# Patient Record
Sex: Male | Born: 1935
Health system: Southern US, Community
[De-identification: ages and names within clinical notes are randomized; demographics above are authoritative.]

## PROBLEM LIST (undated history)

## (undated) ENCOUNTER — Emergency Department (HOSPITAL_COMMUNITY): Admission: EM | Payer: Self-pay | Source: Home / Self Care

## (undated) DIAGNOSIS — Z7901 Long term (current) use of anticoagulants: Secondary | ICD-10-CM

## (undated) DIAGNOSIS — Z8679 Personal history of other diseases of the circulatory system: Secondary | ICD-10-CM

## (undated) DIAGNOSIS — Z952 Presence of prosthetic heart valve: Secondary | ICD-10-CM

## (undated) DIAGNOSIS — I25810 Atherosclerosis of coronary artery bypass graft(s) without angina pectoris: Secondary | ICD-10-CM

## (undated) DIAGNOSIS — I10 Essential (primary) hypertension: Secondary | ICD-10-CM

## (undated) DIAGNOSIS — E039 Hypothyroidism, unspecified: Secondary | ICD-10-CM

## (undated) DIAGNOSIS — I493 Ventricular premature depolarization: Secondary | ICD-10-CM

## (undated) DIAGNOSIS — I483 Typical atrial flutter: Secondary | ICD-10-CM

## (undated) DIAGNOSIS — I4891 Unspecified atrial fibrillation: Secondary | ICD-10-CM

## (undated) HISTORY — DX: Atherosclerosis of coronary artery bypass graft(s) without angina pectoris: I25.810

## (undated) HISTORY — DX: Ventricular premature depolarization: I49.3

## (undated) HISTORY — DX: Presence of prosthetic heart valve: Z95.2

## (undated) HISTORY — PX: FOOT SURGERY: SHX648

## (undated) HISTORY — DX: Unspecified atrial fibrillation: I48.91

## (undated) HISTORY — DX: Essential (primary) hypertension: I10

## (undated) HISTORY — DX: Long term (current) use of anticoagulants: Z79.01

## (undated) SURGERY — EGD (ESOPHAGOGASTRODUODENOSCOPY)
Anesthesia: Monitor Anesthesia Care

---
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
## 1998-09-03 DIAGNOSIS — Z952 Presence of prosthetic heart valve: Secondary | ICD-10-CM

## 1998-09-03 DIAGNOSIS — I25810 Atherosclerosis of coronary artery bypass graft(s) without angina pectoris: Secondary | ICD-10-CM

## 1998-09-03 HISTORY — DX: Presence of prosthetic heart valve: Z95.2

## 1998-09-03 HISTORY — PX: AORTIC VALVE REPLACEMENT (AVR)/CORONARY ARTERY BYPASS GRAFTING (CABG): SHX5725

## 1998-09-03 HISTORY — DX: Atherosclerosis of coronary artery bypass graft(s) without angina pectoris: I25.810

## 1999-08-18 ENCOUNTER — Inpatient Hospital Stay (HOSPITAL_COMMUNITY): Admission: EM | Admit: 1999-08-18 | Discharge: 1999-08-31 | Payer: Self-pay | Admitting: *Deleted

## 1999-08-18 ENCOUNTER — Encounter: Payer: Self-pay | Admitting: *Deleted

## 1999-08-23 ENCOUNTER — Encounter: Payer: Self-pay | Admitting: Internal Medicine

## 1999-08-24 ENCOUNTER — Encounter: Payer: Self-pay | Admitting: Thoracic Surgery (Cardiothoracic Vascular Surgery)

## 1999-08-25 ENCOUNTER — Encounter: Payer: Self-pay | Admitting: Thoracic Surgery (Cardiothoracic Vascular Surgery)

## 1999-08-26 ENCOUNTER — Encounter: Payer: Self-pay | Admitting: Cardiothoracic Surgery

## 1999-08-27 ENCOUNTER — Encounter: Payer: Self-pay | Admitting: Thoracic Surgery (Cardiothoracic Vascular Surgery)

## 1999-08-29 ENCOUNTER — Encounter: Payer: Self-pay | Admitting: Thoracic Surgery (Cardiothoracic Vascular Surgery)

## 1999-09-19 ENCOUNTER — Encounter (HOSPITAL_COMMUNITY): Admission: RE | Admit: 1999-09-19 | Discharge: 1999-12-18 | Payer: Self-pay | Admitting: Interventional Cardiology

## 1999-11-10 ENCOUNTER — Emergency Department (HOSPITAL_COMMUNITY): Admission: EM | Admit: 1999-11-10 | Discharge: 1999-11-10 | Payer: Self-pay | Admitting: Emergency Medicine

## 2000-12-26 ENCOUNTER — Encounter: Admission: RE | Admit: 2000-12-26 | Discharge: 2000-12-26 | Payer: Self-pay | Admitting: Internal Medicine

## 2000-12-26 ENCOUNTER — Encounter: Payer: Self-pay | Admitting: Internal Medicine

## 2001-04-07 ENCOUNTER — Ambulatory Visit (HOSPITAL_COMMUNITY): Admission: RE | Admit: 2001-04-07 | Discharge: 2001-04-07 | Payer: Self-pay | Admitting: Interventional Cardiology

## 2002-07-03 ENCOUNTER — Emergency Department (HOSPITAL_COMMUNITY): Admission: EM | Admit: 2002-07-03 | Discharge: 2002-07-03 | Payer: Self-pay | Admitting: Emergency Medicine

## 2002-07-03 ENCOUNTER — Encounter: Payer: Self-pay | Admitting: *Deleted

## 2003-11-08 ENCOUNTER — Emergency Department (HOSPITAL_COMMUNITY): Admission: EM | Admit: 2003-11-08 | Discharge: 2003-11-08 | Payer: Self-pay | Admitting: Emergency Medicine

## 2003-11-22 ENCOUNTER — Encounter: Admission: RE | Admit: 2003-11-22 | Discharge: 2003-11-22 | Payer: Self-pay | Admitting: Internal Medicine

## 2004-01-31 ENCOUNTER — Ambulatory Visit (HOSPITAL_COMMUNITY): Admission: RE | Admit: 2004-01-31 | Discharge: 2004-01-31 | Payer: Self-pay | Admitting: Orthopedic Surgery

## 2004-03-29 ENCOUNTER — Ambulatory Visit (HOSPITAL_COMMUNITY): Admission: RE | Admit: 2004-03-29 | Discharge: 2004-03-29 | Payer: Self-pay | Admitting: Gastroenterology

## 2004-05-30 ENCOUNTER — Encounter: Admission: RE | Admit: 2004-05-30 | Discharge: 2004-05-30 | Payer: Self-pay | Admitting: Orthopedic Surgery

## 2004-06-12 ENCOUNTER — Encounter: Admission: RE | Admit: 2004-06-12 | Discharge: 2004-06-12 | Payer: Self-pay | Admitting: Orthopedic Surgery

## 2004-06-13 ENCOUNTER — Ambulatory Visit (HOSPITAL_BASED_OUTPATIENT_CLINIC_OR_DEPARTMENT_OTHER): Admission: RE | Admit: 2004-06-13 | Discharge: 2004-06-13 | Payer: Self-pay | Admitting: Orthopedic Surgery

## 2004-06-13 ENCOUNTER — Ambulatory Visit (HOSPITAL_COMMUNITY): Admission: RE | Admit: 2004-06-13 | Discharge: 2004-06-13 | Payer: Self-pay | Admitting: Orthopedic Surgery

## 2005-01-09 ENCOUNTER — Ambulatory Visit (HOSPITAL_COMMUNITY): Admission: RE | Admit: 2005-01-09 | Discharge: 2005-01-09 | Payer: Self-pay | Admitting: Interventional Cardiology

## 2005-01-17 ENCOUNTER — Encounter: Admission: RE | Admit: 2005-01-17 | Discharge: 2005-01-17 | Payer: Self-pay

## 2005-04-02 ENCOUNTER — Emergency Department (HOSPITAL_COMMUNITY): Admission: EM | Admit: 2005-04-02 | Discharge: 2005-04-03 | Payer: Self-pay | Admitting: Emergency Medicine

## 2005-04-07 ENCOUNTER — Inpatient Hospital Stay (HOSPITAL_COMMUNITY): Admission: EM | Admit: 2005-04-07 | Discharge: 2005-04-13 | Payer: Self-pay | Admitting: Emergency Medicine

## 2005-06-22 ENCOUNTER — Inpatient Hospital Stay (HOSPITAL_COMMUNITY): Admission: EM | Admit: 2005-06-22 | Discharge: 2005-06-29 | Payer: Self-pay | Admitting: Emergency Medicine

## 2005-08-14 ENCOUNTER — Emergency Department (HOSPITAL_COMMUNITY): Admission: EM | Admit: 2005-08-14 | Discharge: 2005-08-14 | Payer: Self-pay | Admitting: Emergency Medicine

## 2010-09-24 ENCOUNTER — Encounter: Payer: Self-pay | Admitting: Endocrinology

## 2010-11-18 ENCOUNTER — Inpatient Hospital Stay (INDEPENDENT_AMBULATORY_CARE_PROVIDER_SITE_OTHER)
Admission: RE | Admit: 2010-11-18 | Discharge: 2010-11-18 | Disposition: A | Discharge status: Home / Self Care | Payer: Medicare Other | Source: Ambulatory Visit | Attending: Emergency Medicine | Admitting: Emergency Medicine

## 2010-11-18 DIAGNOSIS — S61209A Unspecified open wound of unspecified finger without damage to nail, initial encounter: Secondary | ICD-10-CM

## 2010-11-18 DIAGNOSIS — S8010XA Contusion of unspecified lower leg, initial encounter: Secondary | ICD-10-CM

## 2010-11-18 LAB — PROTIME-INR
INR: 2.36 — ABNORMAL HIGH (ref 0.00–1.49)
Prothrombin Time: 25.9 seconds — ABNORMAL HIGH (ref 11.6–15.2)

## 2010-11-20 ENCOUNTER — Inpatient Hospital Stay (HOSPITAL_COMMUNITY)
Admission: RE | Admit: 2010-11-20 | Discharge: 2010-11-20 | Disposition: A | Discharge status: Home / Self Care | Payer: No Typology Code available for payment source | Source: Ambulatory Visit

## 2010-11-21 ENCOUNTER — Inpatient Hospital Stay (HOSPITAL_COMMUNITY)
Admission: RE | Admit: 2010-11-21 | Discharge: 2010-11-21 | Disposition: A | Discharge status: Home / Self Care | Payer: No Typology Code available for payment source | Source: Ambulatory Visit

## 2011-06-03 ENCOUNTER — Emergency Department (HOSPITAL_COMMUNITY)
Admission: EM | Admit: 2011-06-03 | Discharge: 2011-06-03 | Disposition: A | Discharge status: Home / Self Care | Payer: Medicare Other | Attending: Emergency Medicine | Admitting: Emergency Medicine

## 2011-06-03 ENCOUNTER — Emergency Department (HOSPITAL_COMMUNITY): Payer: Medicare Other

## 2011-06-03 DIAGNOSIS — E039 Hypothyroidism, unspecified: Secondary | ICD-10-CM | POA: Insufficient documentation

## 2011-06-03 DIAGNOSIS — E7800 Pure hypercholesterolemia, unspecified: Secondary | ICD-10-CM | POA: Insufficient documentation

## 2011-06-03 DIAGNOSIS — Z79899 Other long term (current) drug therapy: Secondary | ICD-10-CM | POA: Insufficient documentation

## 2011-06-03 DIAGNOSIS — I10 Essential (primary) hypertension: Secondary | ICD-10-CM | POA: Insufficient documentation

## 2011-06-03 DIAGNOSIS — R079 Chest pain, unspecified: Secondary | ICD-10-CM | POA: Insufficient documentation

## 2011-06-03 DIAGNOSIS — R002 Palpitations: Secondary | ICD-10-CM | POA: Insufficient documentation

## 2011-06-03 DIAGNOSIS — Z7901 Long term (current) use of anticoagulants: Secondary | ICD-10-CM | POA: Insufficient documentation

## 2011-06-03 LAB — BASIC METABOLIC PANEL
BUN: 16 mg/dL (ref 6–23)
CO2: 29 mEq/L (ref 19–32)
Calcium: 9.7 mg/dL (ref 8.4–10.5)
Chloride: 102 mEq/L (ref 96–112)
Creatinine, Ser: 1.21 mg/dL (ref 0.50–1.35)
GFR calc Af Amer: >60 mL/min (ref >60)
GFR calc non Af Amer: 58 mL/min — ABNORMAL LOW (ref >60)
Glucose, Bld: 100 mg/dL — ABNORMAL HIGH (ref 70–99)
Potassium: 4.7 mEq/L (ref 3.5–5.1)
Sodium: 137 mEq/L (ref 135–145)

## 2011-06-03 LAB — POCT I-STAT TROPONIN I
Troponin i, poc: 0.01 ng/mL (ref 0.00–0.08)
Troponin i, poc: 0.01 ng/mL (ref 0.00–0.08)

## 2011-06-03 LAB — CBC
HCT: 41.9 % (ref 39.0–52.0)
Hemoglobin: 13.9 g/dL (ref 13.0–17.0)
MCH: 31.7 pg (ref 26.0–34.0)
MCHC: 33.2 g/dL (ref 30.0–36.0)
MCV: 95.4 fL (ref 78.0–100.0)
Platelets: 196 10*3/uL (ref 150–400)
RBC: 4.39 MIL/uL (ref 4.22–5.81)
RDW: 13.9 % (ref 11.5–15.5)
WBC: 8.4 10*3/uL (ref 4.0–10.5)

## 2011-06-03 LAB — DIFFERENTIAL
Basophils Absolute: 0 10*3/uL (ref 0.0–0.1)
Basophils Relative: 0 % (ref 0–1)
Eosinophils Absolute: 0.3 10*3/uL (ref 0.0–0.7)
Eosinophils Relative: 4 % (ref 0–5)
Lymphocytes Relative: 18 % (ref 12–46)
Lymphs Abs: 1.5 10*3/uL (ref 0.7–4.0)
Monocytes Absolute: 0.9 10*3/uL (ref 0.1–1.0)
Monocytes Relative: 11 % (ref 3–12)
Neutro Abs: 5.7 10*3/uL (ref 1.7–7.7)
Neutrophils Relative %: 67 % (ref 43–77)

## 2011-06-03 LAB — APTT: aPTT: 42 seconds — ABNORMAL HIGH (ref 24–37)

## 2011-06-03 LAB — PROTIME-INR
INR: 3.02 — ABNORMAL HIGH (ref 0.00–1.49)
Prothrombin Time: 31.8 seconds — ABNORMAL HIGH (ref 11.6–15.2)

## 2011-09-11 DIAGNOSIS — Z7901 Long term (current) use of anticoagulants: Secondary | ICD-10-CM | POA: Diagnosis not present

## 2011-09-11 DIAGNOSIS — E039 Hypothyroidism, unspecified: Secondary | ICD-10-CM | POA: Diagnosis not present

## 2011-09-11 DIAGNOSIS — K589 Irritable bowel syndrome without diarrhea: Secondary | ICD-10-CM | POA: Diagnosis not present

## 2011-09-11 DIAGNOSIS — I10 Essential (primary) hypertension: Secondary | ICD-10-CM | POA: Diagnosis not present

## 2011-10-16 DIAGNOSIS — Z7901 Long term (current) use of anticoagulants: Secondary | ICD-10-CM | POA: Diagnosis not present

## 2011-11-22 DIAGNOSIS — I10 Essential (primary) hypertension: Secondary | ICD-10-CM | POA: Diagnosis not present

## 2011-11-22 DIAGNOSIS — I4891 Unspecified atrial fibrillation: Secondary | ICD-10-CM | POA: Diagnosis not present

## 2011-11-22 DIAGNOSIS — Z954 Presence of other heart-valve replacement: Secondary | ICD-10-CM | POA: Diagnosis not present

## 2011-11-22 DIAGNOSIS — Z7901 Long term (current) use of anticoagulants: Secondary | ICD-10-CM | POA: Diagnosis not present

## 2011-11-22 DIAGNOSIS — E785 Hyperlipidemia, unspecified: Secondary | ICD-10-CM | POA: Diagnosis not present

## 2012-01-03 DIAGNOSIS — Z7901 Long term (current) use of anticoagulants: Secondary | ICD-10-CM | POA: Diagnosis not present

## 2012-01-17 DIAGNOSIS — R300 Dysuria: Secondary | ICD-10-CM | POA: Diagnosis not present

## 2012-01-21 DIAGNOSIS — Z7901 Long term (current) use of anticoagulants: Secondary | ICD-10-CM | POA: Diagnosis not present

## 2012-01-23 DIAGNOSIS — Z79899 Other long term (current) drug therapy: Secondary | ICD-10-CM | POA: Diagnosis not present

## 2012-01-23 DIAGNOSIS — R3989 Other symptoms and signs involving the genitourinary system: Secondary | ICD-10-CM | POA: Diagnosis not present

## 2012-01-23 DIAGNOSIS — E039 Hypothyroidism, unspecified: Secondary | ICD-10-CM | POA: Diagnosis not present

## 2012-01-23 DIAGNOSIS — E782 Mixed hyperlipidemia: Secondary | ICD-10-CM | POA: Diagnosis not present

## 2012-01-23 DIAGNOSIS — N529 Male erectile dysfunction, unspecified: Secondary | ICD-10-CM | POA: Diagnosis not present

## 2012-01-23 DIAGNOSIS — Z1331 Encounter for screening for depression: Secondary | ICD-10-CM | POA: Diagnosis not present

## 2012-01-23 DIAGNOSIS — I2510 Atherosclerotic heart disease of native coronary artery without angina pectoris: Secondary | ICD-10-CM | POA: Diagnosis not present

## 2012-01-23 DIAGNOSIS — Z0000 Encounter for general adult medical examination without abnormal findings: Secondary | ICD-10-CM | POA: Diagnosis not present

## 2012-01-23 DIAGNOSIS — I10 Essential (primary) hypertension: Secondary | ICD-10-CM | POA: Diagnosis not present

## 2012-01-23 DIAGNOSIS — Z954 Presence of other heart-valve replacement: Secondary | ICD-10-CM | POA: Diagnosis not present

## 2012-02-28 DIAGNOSIS — Z7901 Long term (current) use of anticoagulants: Secondary | ICD-10-CM | POA: Diagnosis not present

## 2012-02-28 DIAGNOSIS — R7989 Other specified abnormal findings of blood chemistry: Secondary | ICD-10-CM | POA: Diagnosis not present

## 2012-02-28 DIAGNOSIS — D509 Iron deficiency anemia, unspecified: Secondary | ICD-10-CM | POA: Diagnosis not present

## 2012-03-31 DIAGNOSIS — Z7901 Long term (current) use of anticoagulants: Secondary | ICD-10-CM | POA: Diagnosis not present

## 2012-04-07 DIAGNOSIS — H04129 Dry eye syndrome of unspecified lacrimal gland: Secondary | ICD-10-CM | POA: Diagnosis not present

## 2012-04-28 DIAGNOSIS — Z7901 Long term (current) use of anticoagulants: Secondary | ICD-10-CM | POA: Diagnosis not present

## 2012-05-28 DIAGNOSIS — Z7901 Long term (current) use of anticoagulants: Secondary | ICD-10-CM | POA: Diagnosis not present

## 2012-05-28 DIAGNOSIS — Z23 Encounter for immunization: Secondary | ICD-10-CM | POA: Diagnosis not present

## 2012-07-02 DIAGNOSIS — Z7901 Long term (current) use of anticoagulants: Secondary | ICD-10-CM | POA: Diagnosis not present

## 2012-07-30 DIAGNOSIS — Z7901 Long term (current) use of anticoagulants: Secondary | ICD-10-CM | POA: Diagnosis not present

## 2012-08-29 DIAGNOSIS — Z7901 Long term (current) use of anticoagulants: Secondary | ICD-10-CM | POA: Diagnosis not present

## 2012-09-29 DIAGNOSIS — Z7901 Long term (current) use of anticoagulants: Secondary | ICD-10-CM | POA: Diagnosis not present

## 2012-10-30 DIAGNOSIS — Z7901 Long term (current) use of anticoagulants: Secondary | ICD-10-CM | POA: Diagnosis not present

## 2012-11-25 DIAGNOSIS — I10 Essential (primary) hypertension: Secondary | ICD-10-CM | POA: Diagnosis not present

## 2012-11-25 DIAGNOSIS — I2510 Atherosclerotic heart disease of native coronary artery without angina pectoris: Secondary | ICD-10-CM | POA: Diagnosis not present

## 2012-11-25 DIAGNOSIS — Z7901 Long term (current) use of anticoagulants: Secondary | ICD-10-CM | POA: Diagnosis not present

## 2012-11-25 DIAGNOSIS — E785 Hyperlipidemia, unspecified: Secondary | ICD-10-CM | POA: Diagnosis not present

## 2012-11-25 DIAGNOSIS — I4891 Unspecified atrial fibrillation: Secondary | ICD-10-CM | POA: Diagnosis not present

## 2012-11-25 DIAGNOSIS — Z954 Presence of other heart-valve replacement: Secondary | ICD-10-CM | POA: Diagnosis not present

## 2012-12-30 DIAGNOSIS — Z7901 Long term (current) use of anticoagulants: Secondary | ICD-10-CM | POA: Diagnosis not present

## 2013-02-02 DIAGNOSIS — Z0000 Encounter for general adult medical examination without abnormal findings: Secondary | ICD-10-CM | POA: Diagnosis not present

## 2013-02-02 DIAGNOSIS — Z7901 Long term (current) use of anticoagulants: Secondary | ICD-10-CM | POA: Diagnosis not present

## 2013-02-02 DIAGNOSIS — G609 Hereditary and idiopathic neuropathy, unspecified: Secondary | ICD-10-CM | POA: Diagnosis not present

## 2013-02-02 DIAGNOSIS — I2510 Atherosclerotic heart disease of native coronary artery without angina pectoris: Secondary | ICD-10-CM | POA: Diagnosis not present

## 2013-02-02 DIAGNOSIS — I10 Essential (primary) hypertension: Secondary | ICD-10-CM | POA: Diagnosis not present

## 2013-02-02 DIAGNOSIS — M169 Osteoarthritis of hip, unspecified: Secondary | ICD-10-CM | POA: Diagnosis not present

## 2013-02-02 DIAGNOSIS — E039 Hypothyroidism, unspecified: Secondary | ICD-10-CM | POA: Diagnosis not present

## 2013-02-02 DIAGNOSIS — Z1331 Encounter for screening for depression: Secondary | ICD-10-CM | POA: Diagnosis not present

## 2013-02-02 DIAGNOSIS — E782 Mixed hyperlipidemia: Secondary | ICD-10-CM | POA: Diagnosis not present

## 2013-02-02 DIAGNOSIS — Z79899 Other long term (current) drug therapy: Secondary | ICD-10-CM | POA: Diagnosis not present

## 2013-03-04 DIAGNOSIS — Z7901 Long term (current) use of anticoagulants: Secondary | ICD-10-CM | POA: Diagnosis not present

## 2013-04-02 DIAGNOSIS — Z7901 Long term (current) use of anticoagulants: Secondary | ICD-10-CM | POA: Diagnosis not present

## 2013-05-01 DIAGNOSIS — Z7901 Long term (current) use of anticoagulants: Secondary | ICD-10-CM | POA: Diagnosis not present

## 2013-06-02 DIAGNOSIS — Z7901 Long term (current) use of anticoagulants: Secondary | ICD-10-CM | POA: Diagnosis not present

## 2013-07-03 DIAGNOSIS — Z23 Encounter for immunization: Secondary | ICD-10-CM | POA: Diagnosis not present

## 2013-07-03 DIAGNOSIS — Z7901 Long term (current) use of anticoagulants: Secondary | ICD-10-CM | POA: Diagnosis not present

## 2013-07-03 DIAGNOSIS — I4891 Unspecified atrial fibrillation: Secondary | ICD-10-CM | POA: Diagnosis not present

## 2013-08-03 DIAGNOSIS — I359 Nonrheumatic aortic valve disorder, unspecified: Secondary | ICD-10-CM | POA: Diagnosis not present

## 2013-08-03 DIAGNOSIS — Z7901 Long term (current) use of anticoagulants: Secondary | ICD-10-CM | POA: Diagnosis not present

## 2013-09-04 DIAGNOSIS — Z7901 Long term (current) use of anticoagulants: Secondary | ICD-10-CM | POA: Diagnosis not present

## 2013-09-04 DIAGNOSIS — I359 Nonrheumatic aortic valve disorder, unspecified: Secondary | ICD-10-CM | POA: Diagnosis not present

## 2013-10-09 DIAGNOSIS — Z7901 Long term (current) use of anticoagulants: Secondary | ICD-10-CM | POA: Diagnosis not present

## 2013-10-09 DIAGNOSIS — I4891 Unspecified atrial fibrillation: Secondary | ICD-10-CM | POA: Diagnosis not present

## 2013-11-25 ENCOUNTER — Encounter: Payer: Self-pay | Admitting: Interventional Cardiology

## 2013-11-25 ENCOUNTER — Ambulatory Visit (INDEPENDENT_AMBULATORY_CARE_PROVIDER_SITE_OTHER): Payer: Medicare Other | Admitting: Interventional Cardiology

## 2013-11-25 VITALS — BP 164/80 | HR 58 | Ht 70.0 in | Wt 188.0 lb

## 2013-11-25 DIAGNOSIS — I4891 Unspecified atrial fibrillation: Secondary | ICD-10-CM | POA: Diagnosis not present

## 2013-11-25 DIAGNOSIS — Z7901 Long term (current) use of anticoagulants: Secondary | ICD-10-CM | POA: Diagnosis not present

## 2013-11-25 DIAGNOSIS — I25810 Atherosclerosis of coronary artery bypass graft(s) without angina pectoris: Secondary | ICD-10-CM | POA: Diagnosis not present

## 2013-11-25 DIAGNOSIS — Z954 Presence of other heart-valve replacement: Secondary | ICD-10-CM | POA: Diagnosis not present

## 2013-11-25 DIAGNOSIS — I4949 Other premature depolarization: Secondary | ICD-10-CM

## 2013-11-25 DIAGNOSIS — I493 Ventricular premature depolarization: Secondary | ICD-10-CM

## 2013-11-25 DIAGNOSIS — I10 Essential (primary) hypertension: Secondary | ICD-10-CM | POA: Diagnosis not present

## 2013-11-25 DIAGNOSIS — Z952 Presence of prosthetic heart valve: Secondary | ICD-10-CM

## 2013-11-25 DIAGNOSIS — I4892 Unspecified atrial flutter: Secondary | ICD-10-CM | POA: Insufficient documentation

## 2013-11-25 HISTORY — DX: Essential (primary) hypertension: I10

## 2013-11-25 HISTORY — DX: Ventricular premature depolarization: I49.3

## 2013-11-25 HISTORY — DX: Long term (current) use of anticoagulants: Z79.01

## 2013-11-25 HISTORY — DX: Unspecified atrial fibrillation: I48.91

## 2013-11-25 NOTE — Progress Notes (Signed)
Patient ID: Jason Santana, male   DOB: [birth year], 80 y.o.   MRN: [id number]    [address] [place], [place]  [address] Phone: [phone] Fax:  [phone]  Date:  11/25/2013   ID:  Jason Santana, DOB 03-12-1932, MRN [id number]  PCP:  Henrine Screws, MD   ASSESSMENT:  1. Aortic valve replacement, St. Jude, 2000, normal function clinically and no complaints 2. Coronary artery disease with prior coronary bypass grafting, 2000, no anginal complaints 3. Hypertension under excellent control 4. Chronic anticoagulation therapy without bleeding 5. Hyperlipidemia 6. PVCs  PLAN:  1. No change in the current medical regimen 2. Clinical followup in one year 3. Notify us if bleeding, chest pain, dyspnea, or syncope.   SUBJECTIVE: Jason Santana is a 80 y.o. male who has aortic valve replacement with a mechanical prosthesis and coronary bypass surgery in 2000. He has been asymptomatic since that time. He does have frequent PVCs but no symptoms related to it. He denies peripheral edema. There've been no neurological symptoms.   Wt Readings from Last 3 Encounters:  11/25/13 188 lb (85.276 kg)     Past Medical History  Diagnosis Date  . Atrial fibrillation 11/25/2013  . CAD (coronary artery disease) of artery bypass graft 11/25/2013    2000 with multiple bypasses   . Chronic anticoagulation 11/25/2013    Coumadin therapy for mechanical aortic valve. Postoperative atrial fibrillation.   . Essential hypertension 11/25/2013  . H/O mechanical aortic valve replacement 11/25/2013    St. Jude bowel prosthesis 2000   . Premature ventricular contractions 11/25/2013    Current Outpatient Prescriptions  Medication Sig Dispense Refill  . COUMADIN 5 MG tablet Take 5 mg by mouth as directed. PCP follows for pt.      . CRESTOR 20 MG tablet Take 20 mg by mouth daily.      [name] levothyroxine (SYNTHROID, LEVOTHROID) 75 MCG tablet Take 75 mcg by mouth daily.      . metoprolol succinate  (TOPROL-XL) 100 MG 24 hr tablet Take 100 mg by mouth daily.       No current facility-administered medications for this visit.    Allergies:    Allergies  Allergen Reactions  . Codeine Nausea And Vomiting    Social History:  The patient  reports that he has quit smoking. His smoking use included Cigarettes. He smoked 0.00 packs per day. He does not have any smokeless tobacco history on file. He reports that he does not drink alcohol or use illicit drugs.   ROS:  Please see the history of present illness.   He denies neurological symptoms. He has not had syncope. No blood in his urine or stool. He denies angina.   All other systems reviewed and negative.   OBJECTIVE: VS:  BP 164/80  Pulse 58  Ht 5\' 10"  (1.778 m)  Wt 188 lb (85.276 kg)  BMI 26.98 kg/m2 Well nourished, well developed, in no acute distress, appears his stated age 52: normal Neck: JVD flat. Carotid bruit absent  Cardiac:  normal S1, S2; RRR;  1/6 systolic murmur right upper sternal border.no diastolic murmur. Mechanical valve sounds are heard and are crisp. Lungs:  clear to auscultation bilaterally, no wheezing, rhonchi or rales Abd: soft, nontender, no hepatomegaly Ext: Edema absent. Pulses 2+ and symmetric Skin: warm and dry Neuro:  CNs 2-12 intact, no focal abnormalities noted  EKG:  Sinus bradycardia at 58 beats per minute with left atrial abnormality and left  axis deviation LVH is probably also present based upon leaving 1 and aVL and 2       Signed, Illene Labrador III, MD 11/25/2013 9:58 AM

## 2013-11-25 NOTE — Patient Instructions (Signed)
Your physician recommends that you continue on your current medications as directed. Please refer to the Current Medication list given to you today.  Your physician wants you to follow-up in: 1 year. You will receive a reminder letter in the mail two months in advance. If you don't receive a letter, please call our office to schedule the follow-up appointment.  

## 2014-01-05 DIAGNOSIS — I4891 Unspecified atrial fibrillation: Secondary | ICD-10-CM | POA: Diagnosis not present

## 2014-01-05 DIAGNOSIS — Z7901 Long term (current) use of anticoagulants: Secondary | ICD-10-CM | POA: Diagnosis not present

## 2014-01-18 DIAGNOSIS — IMO0001 Reserved for inherently not codable concepts without codable children: Secondary | ICD-10-CM | POA: Diagnosis not present

## 2014-01-18 DIAGNOSIS — D539 Nutritional anemia, unspecified: Secondary | ICD-10-CM | POA: Diagnosis not present

## 2014-01-18 DIAGNOSIS — R5383 Other fatigue: Secondary | ICD-10-CM | POA: Diagnosis not present

## 2014-01-18 DIAGNOSIS — I499 Cardiac arrhythmia, unspecified: Secondary | ICD-10-CM | POA: Diagnosis not present

## 2014-01-18 DIAGNOSIS — R0602 Shortness of breath: Secondary | ICD-10-CM | POA: Diagnosis not present

## 2014-01-18 DIAGNOSIS — R5381 Other malaise: Secondary | ICD-10-CM | POA: Diagnosis not present

## 2014-01-18 DIAGNOSIS — R197 Diarrhea, unspecified: Secondary | ICD-10-CM | POA: Diagnosis not present

## 2014-02-15 DIAGNOSIS — Z0000 Encounter for general adult medical examination without abnormal findings: Secondary | ICD-10-CM | POA: Diagnosis not present

## 2014-02-15 DIAGNOSIS — I4891 Unspecified atrial fibrillation: Secondary | ICD-10-CM | POA: Diagnosis not present

## 2014-02-15 DIAGNOSIS — Z23 Encounter for immunization: Secondary | ICD-10-CM | POA: Diagnosis not present

## 2014-02-15 DIAGNOSIS — Z1331 Encounter for screening for depression: Secondary | ICD-10-CM | POA: Diagnosis not present

## 2014-02-15 DIAGNOSIS — I2510 Atherosclerotic heart disease of native coronary artery without angina pectoris: Secondary | ICD-10-CM | POA: Diagnosis not present

## 2014-02-15 DIAGNOSIS — E039 Hypothyroidism, unspecified: Secondary | ICD-10-CM | POA: Diagnosis not present

## 2014-02-15 DIAGNOSIS — I10 Essential (primary) hypertension: Secondary | ICD-10-CM | POA: Diagnosis not present

## 2014-02-15 DIAGNOSIS — E782 Mixed hyperlipidemia: Secondary | ICD-10-CM | POA: Diagnosis not present

## 2014-02-15 DIAGNOSIS — D509 Iron deficiency anemia, unspecified: Secondary | ICD-10-CM | POA: Diagnosis not present

## 2014-02-15 DIAGNOSIS — Z7901 Long term (current) use of anticoagulants: Secondary | ICD-10-CM | POA: Diagnosis not present

## 2014-03-09 DIAGNOSIS — Z7901 Long term (current) use of anticoagulants: Secondary | ICD-10-CM | POA: Diagnosis not present

## 2014-03-09 DIAGNOSIS — I4891 Unspecified atrial fibrillation: Secondary | ICD-10-CM | POA: Diagnosis not present

## 2014-03-09 DIAGNOSIS — D509 Iron deficiency anemia, unspecified: Secondary | ICD-10-CM | POA: Diagnosis not present

## 2014-04-16 DIAGNOSIS — I4891 Unspecified atrial fibrillation: Secondary | ICD-10-CM | POA: Diagnosis not present

## 2014-04-16 DIAGNOSIS — Z7901 Long term (current) use of anticoagulants: Secondary | ICD-10-CM | POA: Diagnosis not present

## 2014-05-17 DIAGNOSIS — Z7901 Long term (current) use of anticoagulants: Secondary | ICD-10-CM | POA: Diagnosis not present

## 2014-05-17 DIAGNOSIS — I4891 Unspecified atrial fibrillation: Secondary | ICD-10-CM | POA: Diagnosis not present

## 2014-06-02 DIAGNOSIS — I4891 Unspecified atrial fibrillation: Secondary | ICD-10-CM | POA: Diagnosis not present

## 2014-06-02 DIAGNOSIS — Z7901 Long term (current) use of anticoagulants: Secondary | ICD-10-CM | POA: Diagnosis not present

## 2014-06-03 DIAGNOSIS — I10 Essential (primary) hypertension: Secondary | ICD-10-CM | POA: Diagnosis not present

## 2014-06-10 DIAGNOSIS — I482 Chronic atrial fibrillation: Secondary | ICD-10-CM | POA: Diagnosis not present

## 2014-06-10 DIAGNOSIS — Z7901 Long term (current) use of anticoagulants: Secondary | ICD-10-CM | POA: Diagnosis not present

## 2014-06-10 DIAGNOSIS — I10 Essential (primary) hypertension: Secondary | ICD-10-CM | POA: Diagnosis not present

## 2014-06-10 DIAGNOSIS — Z952 Presence of prosthetic heart valve: Secondary | ICD-10-CM | POA: Diagnosis not present

## 2014-06-16 DIAGNOSIS — Z23 Encounter for immunization: Secondary | ICD-10-CM | POA: Diagnosis not present

## 2014-06-16 DIAGNOSIS — I350 Nonrheumatic aortic (valve) stenosis: Secondary | ICD-10-CM | POA: Diagnosis not present

## 2014-06-16 DIAGNOSIS — Z7901 Long term (current) use of anticoagulants: Secondary | ICD-10-CM | POA: Diagnosis not present

## 2014-07-19 DIAGNOSIS — Z7901 Long term (current) use of anticoagulants: Secondary | ICD-10-CM | POA: Diagnosis not present

## 2014-07-19 DIAGNOSIS — I350 Nonrheumatic aortic (valve) stenosis: Secondary | ICD-10-CM | POA: Diagnosis not present

## 2014-07-20 DIAGNOSIS — S7012XA Contusion of left thigh, initial encounter: Secondary | ICD-10-CM | POA: Diagnosis not present

## 2014-08-19 DIAGNOSIS — Z952 Presence of prosthetic heart valve: Secondary | ICD-10-CM | POA: Diagnosis not present

## 2014-08-19 DIAGNOSIS — I482 Chronic atrial fibrillation: Secondary | ICD-10-CM | POA: Diagnosis not present

## 2014-08-19 DIAGNOSIS — S7012XD Contusion of left thigh, subsequent encounter: Secondary | ICD-10-CM | POA: Diagnosis not present

## 2014-08-19 DIAGNOSIS — I10 Essential (primary) hypertension: Secondary | ICD-10-CM | POA: Diagnosis not present

## 2014-08-19 DIAGNOSIS — Z7901 Long term (current) use of anticoagulants: Secondary | ICD-10-CM | POA: Diagnosis not present

## 2014-09-20 DIAGNOSIS — I482 Chronic atrial fibrillation: Secondary | ICD-10-CM | POA: Diagnosis not present

## 2014-09-20 DIAGNOSIS — Z7901 Long term (current) use of anticoagulants: Secondary | ICD-10-CM | POA: Diagnosis not present

## 2014-10-21 DIAGNOSIS — Z7901 Long term (current) use of anticoagulants: Secondary | ICD-10-CM | POA: Diagnosis not present

## 2014-10-21 DIAGNOSIS — I482 Chronic atrial fibrillation: Secondary | ICD-10-CM | POA: Diagnosis not present

## 2014-11-17 DIAGNOSIS — I482 Chronic atrial fibrillation: Secondary | ICD-10-CM | POA: Diagnosis not present

## 2014-11-17 DIAGNOSIS — Z7901 Long term (current) use of anticoagulants: Secondary | ICD-10-CM | POA: Diagnosis not present

## 2014-12-13 ENCOUNTER — Encounter: Payer: Self-pay | Admitting: Interventional Cardiology

## 2014-12-13 ENCOUNTER — Ambulatory Visit (INDEPENDENT_AMBULATORY_CARE_PROVIDER_SITE_OTHER): Payer: Medicare Other | Admitting: Interventional Cardiology

## 2014-12-13 VITALS — BP 140/50 | HR 61 | Ht 70.0 in | Wt 188.1 lb

## 2014-12-13 DIAGNOSIS — Z954 Presence of other heart-valve replacement: Secondary | ICD-10-CM

## 2014-12-13 DIAGNOSIS — I25812 Atherosclerosis of bypass graft of coronary artery of transplanted heart without angina pectoris: Secondary | ICD-10-CM

## 2014-12-13 DIAGNOSIS — I10 Essential (primary) hypertension: Secondary | ICD-10-CM | POA: Diagnosis not present

## 2014-12-13 DIAGNOSIS — I482 Chronic atrial fibrillation, unspecified: Secondary | ICD-10-CM

## 2014-12-13 DIAGNOSIS — M6281 Muscle weakness (generalized): Secondary | ICD-10-CM

## 2014-12-13 DIAGNOSIS — Z7901 Long term (current) use of anticoagulants: Secondary | ICD-10-CM

## 2014-12-13 DIAGNOSIS — Z952 Presence of prosthetic heart valve: Secondary | ICD-10-CM

## 2014-12-13 NOTE — Patient Instructions (Signed)
Your physician recommends that you continue on your current medications as directed. Please refer to the Current Medication list given to you today.  Your physician has requested that you have an echocardiogram. Echocardiography is a painless test that uses sound waves to create images of your heart. It provides your doctor with information about the size and shape of your heart and how well your heart's chambers and valves are working. This procedure takes approximately one hour. There are no restrictions for this procedure.  Your physician wants you to follow-up in: 1 year with Dr. Smith.  You will receive a reminder letter in the mail two months in advance. If you don't receive a letter, please call our office to schedule the follow-up appointment.  

## 2014-12-13 NOTE — Progress Notes (Signed)
Cardiology Office Note   Date:  12/13/2014   ID:  Jason Santana, DOB 1931/09/27, MRN [id number]  PCP:  Henrine Screws, MD  Cardiologist:    Sinclair Grooms, MD   No chief complaint on file.     History of Present Illness: Jason Santana is a 81 y.o. male who presents for valvular heart disease with prior mechanical aortic valve replacement (2000), chronic atrial fibrillation,, chronic anticoagulation therapy, and coronary artery disease.  Exertional tolerance decreased somewhat earlier in the year. Dr. Inda Merlin identified that he had muscle weakness related to Crestor. His dose is been decreased to 5 mg 3 times per week. He doesn't know if follow-up lipids have been done. He has had no neurological complaints. He denies orthopnea and PND.    Past Medical History  Diagnosis Date  . Atrial fibrillation 11/25/2013  . CAD (coronary artery disease) of artery bypass graft 11/25/2013    2000 with multiple bypasses   . Chronic anticoagulation 11/25/2013    Coumadin therapy for mechanical aortic valve. Postoperative atrial fibrillation.   . Essential hypertension 11/25/2013  . H/O mechanical aortic valve replacement 11/25/2013    St. Jude bowel prosthesis 2000   . Premature ventricular contractions 11/25/2013    History reviewed. No pertinent past surgical history.   Current Outpatient Prescriptions  Medication Sig Dispense Refill  . cholecalciferol (VITAMIN D) 1000 UNITS tablet Take 1,000 Units by mouth daily.    [name] COUMADIN 5 MG tablet Take 5 mg by mouth as directed. PCP follows for pt.    . CRESTOR 20 MG tablet Take 20 mg by mouth daily.    . hydrochlorothiazide (MICROZIDE) 12.5 MG capsule Take 12.5 mg by mouth daily.    . Iron TABS Take by mouth daily. Pt not sure of dosage    . levothyroxine (SYNTHROID, LEVOTHROID) 75 MCG tablet Take 75 mcg by mouth daily.    . metoprolol succinate (TOPROL-XL) 100 MG 24 hr tablet Take 100 mg by mouth daily.     No current  facility-administered medications for this visit.    Allergies:   Vicodin; Zocor; Benicar; Codeine; Lopid; Monopril; Phenergan; and Prilosec    Social History:  The patient  reports that he has quit smoking. His smoking use included Cigarettes. He does not have any smokeless tobacco history on file. He reports that he does not drink alcohol or use illicit drugs.   Family History:  The patient's family history includes Heart attack in his mother; Hypertension in his father and mother.    ROS:  Please see the history of present illness.   Otherwise, review of systems are positive for absence of head trauma. Some muscle aches. Occasional palpitations..   All other systems are reviewed and negative.    PHYSICAL EXAM: VS:  BP 140/50 mmHg  Pulse 61  Ht 5\' 10"  (1.778 m)  Wt 188 lb 1.9 oz (85.331 kg)  BMI 26.99 kg/m2 , BMI Body mass index is 26.99 kg/(m^2). GEN: Well nourished, well developed, in no acute distress HEENT: normal Neck: no JVD, carotid bruits, or masses Cardiac: Irregular RR; no murmurs, rubs, or gallops,no edema . Crisp mechanical valve closure sounds are heard. Respiratory:  clear to auscultation bilaterally, normal work of breathing GI: soft, nontender, nondistended, + BS MS: no deformity or atrophy Skin: warm and dry, no rash Neuro:  Strength and sensation are intact Psych: euthymic mood, full affect   EKG:  EKG is ordered today. The ekg ordered today demonstrates sinus  bradycardia at 61 bpm, biatrial abnormality, occasional interpolated PVC. An left anterior hemiblock with LVH   Recent Labs: No results found for requested labs within last 365 days.    Lipid Panel No results found for: CHOL, TRIG, HDL, CHOLHDL, VLDL, LDLCALC, LDLDIRECT    Wt Readings from Last 3 Encounters:  12/13/14 188 lb 1.9 oz (85.331 kg)  11/25/13 188 lb (85.276 kg)      Other studies Reviewed: Additional studies/ records that were reviewed today include: No evidence of prior/recent  echo. Review of the above records demonstrates: No prior echo   ASSESSMENT AND PLAN:  H/O mechanical aortic valve replacement: St. Jude mechanical aortic valve  Chronic atrial fibrillation: No evidence of atrial fibrillation on today's exam.  Coronary artery disease involving bypass graft of transplanted heart without angina pectoris: Asymptomatic  Chronic anticoagulation: No bleeding complications  Essential hypertension: Controlled  Hyperlipidemia: Statin intensity decreased by Dr. Inda Merlin because of muscle weakness. Muscle weakness has resolved.     Current medicines are reviewed at length with the patient today.  The patient does not have concerns regarding medicines.  The following changes have been made:  no change  Labs/ tests ordered today include:   Orders Placed This Encounter  Procedures  . EKG 12-Lead  . 2D Echocardiogram without contrast    2-D Doppler echocardiogram will be performed as surveillance of the mechanical aortic valve Disposition:   FU with Linard Millers in 12 months   Signed, Sinclair Grooms, MD  12/13/2014 9:44 AM    [facility] [place], [place], [place]  [address] Phone: [phone]; Fax: [phone]

## 2014-12-15 ENCOUNTER — Other Ambulatory Visit: Payer: Self-pay

## 2014-12-15 ENCOUNTER — Ambulatory Visit (HOSPITAL_COMMUNITY): Payer: Medicare Other | Attending: Internal Medicine | Admitting: Radiology

## 2014-12-15 DIAGNOSIS — Z952 Presence of prosthetic heart valve: Secondary | ICD-10-CM

## 2014-12-15 DIAGNOSIS — Z954 Presence of other heart-valve replacement: Secondary | ICD-10-CM | POA: Insufficient documentation

## 2014-12-15 DIAGNOSIS — I4891 Unspecified atrial fibrillation: Secondary | ICD-10-CM

## 2014-12-15 DIAGNOSIS — R001 Bradycardia, unspecified: Secondary | ICD-10-CM

## 2014-12-15 NOTE — Progress Notes (Signed)
Pt in Echo, triage was called because pt HR was low and thought to have EKG changes. EKG conducted and reviewed by Flex PA.  Indicated pt having bigeminy PVC. Pt is asymptomatic, HR 57, BP 142/70, and pt been taking medications as prescribed.

## 2014-12-15 NOTE — Progress Notes (Signed)
Echocardiogram performed. During exam echo machine was unable to calculate heart rate due to ectopy. Triage was called and a EKG and rhythm strip were performed.  EKG was reviewed by Rosaria Ferries PA.  Patient was discharged to triage nurse.

## 2014-12-20 ENCOUNTER — Telehealth: Payer: Self-pay

## 2014-12-20 DIAGNOSIS — I482 Chronic atrial fibrillation: Secondary | ICD-10-CM | POA: Diagnosis not present

## 2014-12-20 DIAGNOSIS — Z7901 Long term (current) use of anticoagulants: Secondary | ICD-10-CM | POA: Diagnosis not present

## 2014-12-20 MED ORDER — ASPIRIN EC 81 MG PO TBEC: 81.0000 mg | DELAYED_RELEASE_TABLET | ORAL | Status: DC

## 2014-12-20 NOTE — Telephone Encounter (Signed)
12-20-14 1153am --pt rtn call--pls call [phone]

## 2014-12-20 NOTE — Telephone Encounter (Signed)
Overall normal valve function. There is a string-like structure on the valve. Could be fibrin(clot). Add aspirin 81 mg M, W, F. Watch for bleeding.    Overall the echo was otherwise normal

## 2014-12-20 NOTE — Telephone Encounter (Signed)
Pt aware of echo results and Dr.Smith's recommendation. Overall normal valve function. There is a string-like structure on the valve. Could be fibrin(clot). Add aspirin 81 mg M, W, F. Watch for bleeding.    Overall the echo was otherwise normal.  Pt verbalized understanding.

## 2014-12-20 NOTE — Telephone Encounter (Signed)
-----   Message from Belva Crome, MD sent at 12/16/2014  7:53 AM EDT ----- Overall normal valve function. There is a string-like structure on the valve. Could be fibrin(clot). Add aspirin 81 mg M, W, F. Watch for bleeding. Overall the echo was otherwise normal.

## 2015-01-19 DIAGNOSIS — I482 Chronic atrial fibrillation: Secondary | ICD-10-CM | POA: Diagnosis not present

## 2015-01-19 DIAGNOSIS — Z7901 Long term (current) use of anticoagulants: Secondary | ICD-10-CM | POA: Diagnosis not present

## 2015-03-04 DIAGNOSIS — E039 Hypothyroidism, unspecified: Secondary | ICD-10-CM | POA: Diagnosis not present

## 2015-03-04 DIAGNOSIS — I482 Chronic atrial fibrillation: Secondary | ICD-10-CM | POA: Diagnosis not present

## 2015-03-04 DIAGNOSIS — E782 Mixed hyperlipidemia: Secondary | ICD-10-CM | POA: Diagnosis not present

## 2015-03-04 DIAGNOSIS — Z7901 Long term (current) use of anticoagulants: Secondary | ICD-10-CM | POA: Diagnosis not present

## 2015-03-04 DIAGNOSIS — Z0001 Encounter for general adult medical examination with abnormal findings: Secondary | ICD-10-CM | POA: Diagnosis not present

## 2015-03-04 DIAGNOSIS — E559 Vitamin D deficiency, unspecified: Secondary | ICD-10-CM | POA: Diagnosis not present

## 2015-03-04 DIAGNOSIS — Z1389 Encounter for screening for other disorder: Secondary | ICD-10-CM | POA: Diagnosis not present

## 2015-03-04 DIAGNOSIS — I10 Essential (primary) hypertension: Secondary | ICD-10-CM | POA: Diagnosis not present

## 2015-03-04 DIAGNOSIS — Z79899 Other long term (current) drug therapy: Secondary | ICD-10-CM | POA: Diagnosis not present

## 2015-03-04 DIAGNOSIS — Z952 Presence of prosthetic heart valve: Secondary | ICD-10-CM | POA: Diagnosis not present

## 2015-03-04 DIAGNOSIS — G609 Hereditary and idiopathic neuropathy, unspecified: Secondary | ICD-10-CM | POA: Diagnosis not present

## 2015-04-01 DIAGNOSIS — Z7901 Long term (current) use of anticoagulants: Secondary | ICD-10-CM | POA: Diagnosis not present

## 2015-05-02 DIAGNOSIS — Z7901 Long term (current) use of anticoagulants: Secondary | ICD-10-CM | POA: Diagnosis not present

## 2015-06-06 DIAGNOSIS — Z7901 Long term (current) use of anticoagulants: Secondary | ICD-10-CM | POA: Diagnosis not present

## 2015-07-04 DIAGNOSIS — Z7901 Long term (current) use of anticoagulants: Secondary | ICD-10-CM | POA: Diagnosis not present

## 2015-07-12 DIAGNOSIS — Z23 Encounter for immunization: Secondary | ICD-10-CM | POA: Diagnosis not present

## 2015-08-01 DIAGNOSIS — Z7901 Long term (current) use of anticoagulants: Secondary | ICD-10-CM | POA: Diagnosis not present

## 2015-08-23 DIAGNOSIS — Z7901 Long term (current) use of anticoagulants: Secondary | ICD-10-CM | POA: Diagnosis not present

## 2015-08-31 DIAGNOSIS — Z7901 Long term (current) use of anticoagulants: Secondary | ICD-10-CM | POA: Diagnosis not present

## 2015-09-12 DIAGNOSIS — Z7901 Long term (current) use of anticoagulants: Secondary | ICD-10-CM | POA: Diagnosis not present

## 2015-09-12 DIAGNOSIS — E559 Vitamin D deficiency, unspecified: Secondary | ICD-10-CM | POA: Diagnosis not present

## 2015-09-12 DIAGNOSIS — I10 Essential (primary) hypertension: Secondary | ICD-10-CM | POA: Diagnosis not present

## 2015-09-12 DIAGNOSIS — R945 Abnormal results of liver function studies: Secondary | ICD-10-CM | POA: Diagnosis not present

## 2015-09-12 DIAGNOSIS — N183 Chronic kidney disease, stage 3 (moderate): Secondary | ICD-10-CM | POA: Diagnosis not present

## 2015-10-12 DIAGNOSIS — I10 Essential (primary) hypertension: Secondary | ICD-10-CM | POA: Diagnosis not present

## 2015-10-12 DIAGNOSIS — Z7901 Long term (current) use of anticoagulants: Secondary | ICD-10-CM | POA: Diagnosis not present

## 2015-11-02 DIAGNOSIS — Z7901 Long term (current) use of anticoagulants: Secondary | ICD-10-CM | POA: Diagnosis not present

## 2015-12-02 DIAGNOSIS — R6883 Chills (without fever): Secondary | ICD-10-CM | POA: Diagnosis not present

## 2015-12-02 DIAGNOSIS — J101 Influenza due to other identified influenza virus with other respiratory manifestations: Secondary | ICD-10-CM | POA: Diagnosis not present

## 2015-12-02 DIAGNOSIS — R509 Fever, unspecified: Secondary | ICD-10-CM | POA: Diagnosis not present

## 2015-12-05 DIAGNOSIS — Z7901 Long term (current) use of anticoagulants: Secondary | ICD-10-CM | POA: Diagnosis not present

## 2015-12-14 DIAGNOSIS — Z7901 Long term (current) use of anticoagulants: Secondary | ICD-10-CM | POA: Diagnosis not present

## 2016-01-16 DIAGNOSIS — Z7901 Long term (current) use of anticoagulants: Secondary | ICD-10-CM | POA: Diagnosis not present

## 2016-02-13 DIAGNOSIS — Z7901 Long term (current) use of anticoagulants: Secondary | ICD-10-CM | POA: Diagnosis not present

## 2016-02-14 DIAGNOSIS — Z961 Presence of intraocular lens: Secondary | ICD-10-CM | POA: Diagnosis not present

## 2016-02-14 DIAGNOSIS — H5212 Myopia, left eye: Secondary | ICD-10-CM | POA: Diagnosis not present

## 2016-02-14 DIAGNOSIS — H5201 Hypermetropia, right eye: Secondary | ICD-10-CM | POA: Diagnosis not present

## 2016-02-14 DIAGNOSIS — H52223 Regular astigmatism, bilateral: Secondary | ICD-10-CM | POA: Diagnosis not present

## 2016-02-14 DIAGNOSIS — H26491 Other secondary cataract, right eye: Secondary | ICD-10-CM | POA: Diagnosis not present

## 2016-03-12 DIAGNOSIS — Z7901 Long term (current) use of anticoagulants: Secondary | ICD-10-CM | POA: Diagnosis not present

## 2016-04-09 DIAGNOSIS — Z7901 Long term (current) use of anticoagulants: Secondary | ICD-10-CM | POA: Diagnosis not present

## 2016-04-16 DIAGNOSIS — Z7901 Long term (current) use of anticoagulants: Secondary | ICD-10-CM | POA: Diagnosis not present

## 2016-04-26 DIAGNOSIS — Z7901 Long term (current) use of anticoagulants: Secondary | ICD-10-CM | POA: Diagnosis not present

## 2016-04-26 DIAGNOSIS — M169 Osteoarthritis of hip, unspecified: Secondary | ICD-10-CM | POA: Diagnosis not present

## 2016-04-26 DIAGNOSIS — J309 Allergic rhinitis, unspecified: Secondary | ICD-10-CM | POA: Diagnosis not present

## 2016-04-26 DIAGNOSIS — N5201 Erectile dysfunction due to arterial insufficiency: Secondary | ICD-10-CM | POA: Diagnosis not present

## 2016-04-26 DIAGNOSIS — Z952 Presence of prosthetic heart valve: Secondary | ICD-10-CM | POA: Diagnosis not present

## 2016-04-26 DIAGNOSIS — D509 Iron deficiency anemia, unspecified: Secondary | ICD-10-CM | POA: Diagnosis not present

## 2016-04-26 DIAGNOSIS — R7989 Other specified abnormal findings of blood chemistry: Secondary | ICD-10-CM | POA: Diagnosis not present

## 2016-04-26 DIAGNOSIS — E039 Hypothyroidism, unspecified: Secondary | ICD-10-CM | POA: Diagnosis not present

## 2016-04-26 DIAGNOSIS — I482 Chronic atrial fibrillation: Secondary | ICD-10-CM | POA: Diagnosis not present

## 2016-04-26 DIAGNOSIS — I2510 Atherosclerotic heart disease of native coronary artery without angina pectoris: Secondary | ICD-10-CM | POA: Diagnosis not present

## 2016-04-26 DIAGNOSIS — G609 Hereditary and idiopathic neuropathy, unspecified: Secondary | ICD-10-CM | POA: Diagnosis not present

## 2016-04-26 DIAGNOSIS — I10 Essential (primary) hypertension: Secondary | ICD-10-CM | POA: Diagnosis not present

## 2016-04-26 DIAGNOSIS — E559 Vitamin D deficiency, unspecified: Secondary | ICD-10-CM | POA: Diagnosis not present

## 2016-04-26 DIAGNOSIS — Z1389 Encounter for screening for other disorder: Secondary | ICD-10-CM | POA: Diagnosis not present

## 2016-04-26 DIAGNOSIS — E782 Mixed hyperlipidemia: Secondary | ICD-10-CM | POA: Diagnosis not present

## 2016-04-26 DIAGNOSIS — N183 Chronic kidney disease, stage 3 (moderate): Secondary | ICD-10-CM | POA: Diagnosis not present

## 2016-04-26 DIAGNOSIS — Z0000 Encounter for general adult medical examination without abnormal findings: Secondary | ICD-10-CM | POA: Diagnosis not present

## 2016-04-26 DIAGNOSIS — Z79899 Other long term (current) drug therapy: Secondary | ICD-10-CM | POA: Diagnosis not present

## 2016-05-14 DIAGNOSIS — Z7901 Long term (current) use of anticoagulants: Secondary | ICD-10-CM | POA: Diagnosis not present

## 2016-06-11 DIAGNOSIS — Z7901 Long term (current) use of anticoagulants: Secondary | ICD-10-CM | POA: Diagnosis not present

## 2016-07-09 DIAGNOSIS — Z7901 Long term (current) use of anticoagulants: Secondary | ICD-10-CM | POA: Diagnosis not present

## 2016-08-03 ENCOUNTER — Emergency Department (HOSPITAL_COMMUNITY)
Admission: EM | Admit: 2016-08-03 | Discharge: 2016-08-03 | Disposition: A | Discharge status: Home / Self Care | Payer: Medicare Other | Attending: Emergency Medicine | Admitting: Emergency Medicine

## 2016-08-03 ENCOUNTER — Emergency Department (HOSPITAL_COMMUNITY): Payer: Medicare Other

## 2016-08-03 ENCOUNTER — Encounter (HOSPITAL_COMMUNITY): Payer: Self-pay | Admitting: Emergency Medicine

## 2016-08-03 DIAGNOSIS — Z87891 Personal history of nicotine dependence: Secondary | ICD-10-CM | POA: Diagnosis not present

## 2016-08-03 DIAGNOSIS — I4891 Unspecified atrial fibrillation: Secondary | ICD-10-CM

## 2016-08-03 DIAGNOSIS — R000 Tachycardia, unspecified: Secondary | ICD-10-CM | POA: Diagnosis not present

## 2016-08-03 DIAGNOSIS — I2510 Atherosclerotic heart disease of native coronary artery without angina pectoris: Secondary | ICD-10-CM | POA: Diagnosis not present

## 2016-08-03 DIAGNOSIS — Z7901 Long term (current) use of anticoagulants: Secondary | ICD-10-CM | POA: Insufficient documentation

## 2016-08-03 DIAGNOSIS — I10 Essential (primary) hypertension: Secondary | ICD-10-CM | POA: Insufficient documentation

## 2016-08-03 DIAGNOSIS — Z7982 Long term (current) use of aspirin: Secondary | ICD-10-CM | POA: Diagnosis not present

## 2016-08-03 DIAGNOSIS — I484 Atypical atrial flutter: Secondary | ICD-10-CM | POA: Diagnosis not present

## 2016-08-03 DIAGNOSIS — I4892 Unspecified atrial flutter: Secondary | ICD-10-CM | POA: Diagnosis not present

## 2016-08-03 DIAGNOSIS — R002 Palpitations: Secondary | ICD-10-CM | POA: Diagnosis not present

## 2016-08-03 DIAGNOSIS — I483 Typical atrial flutter: Secondary | ICD-10-CM

## 2016-08-03 LAB — COMPREHENSIVE METABOLIC PANEL
ALBUMIN: 3.9 g/dL (ref 3.5–5.0)
ALT: 78 U/L — AB (ref 17–63)
AST: 131 U/L — AB (ref 15–41)
Alkaline Phosphatase: 64 U/L (ref 38–126)
Anion gap: 9 (ref 5–15)
BILIRUBIN TOTAL: 1.8 mg/dL — AB (ref 0.3–1.2)
BUN: 11 mg/dL (ref 6–20)
CO2: 26 mmol/L (ref 22–32)
CREATININE: 1.11 mg/dL (ref 0.61–1.24)
Calcium: 9.9 mg/dL (ref 8.9–10.3)
Chloride: 99 mmol/L — ABNORMAL LOW (ref 101–111)
GFR calc Af Amer: >60 mL/min (ref >60)
GFR, EST NON AFRICAN AMERICAN: 60 mL/min — AB (ref >60)
Glucose, Bld: 128 mg/dL — ABNORMAL HIGH (ref 65–99)
Potassium: 4.1 mmol/L (ref 3.5–5.1)
Sodium: 134 mmol/L — ABNORMAL LOW (ref 135–145)
TOTAL PROTEIN: 8 g/dL (ref 6.5–8.1)

## 2016-08-03 LAB — CBC WITH DIFFERENTIAL/PLATELET
BASOS ABS: 0 10*3/uL (ref 0.0–0.1)
BASOS PCT: 0 %
Eosinophils Absolute: 0.2 10*3/uL (ref 0.0–0.7)
Eosinophils Relative: 2 %
HEMATOCRIT: 45.6 % (ref 39.0–52.0)
HEMOGLOBIN: 15.5 g/dL (ref 13.0–17.0)
LYMPHS PCT: 18 %
Lymphs Abs: 1.6 10*3/uL (ref 0.7–4.0)
MCH: 34 pg (ref 26.0–34.0)
MCHC: 34 g/dL (ref 30.0–36.0)
MCV: 100 fL (ref 78.0–100.0)
Monocytes Absolute: 0.7 10*3/uL (ref 0.1–1.0)
Monocytes Relative: 8 %
NEUTROS ABS: 6.4 10*3/uL (ref 1.7–7.7)
NEUTROS PCT: 72 %
Platelets: 206 10*3/uL (ref 150–400)
RBC: 4.56 MIL/uL (ref 4.22–5.81)
RDW: 13.3 % (ref 11.5–15.5)
WBC: 8.8 10*3/uL (ref 4.0–10.5)

## 2016-08-03 LAB — PROTIME-INR
INR: 2.3
PROTHROMBIN TIME: 25.7 s — AB (ref 11.4–15.2)

## 2016-08-03 MED ORDER — ETOMIDATE 2 MG/ML IV SOLN
0.1500 mg/kg | Freq: Once | INTRAVENOUS | Status: AC
  Administered 2016-08-03: 12.8 mg via INTRAVENOUS
  Filled 2016-08-03: qty 10

## 2016-08-03 NOTE — ED Provider Notes (Addendum)
[facility] DEPT Provider Note   CSN: [id number] Arrival date & time: 08/03/16  1001     History   Chief Complaint Chief Complaint  Patient presents with  . Palpitations    HPI Jason Santana is a 82 y.o. male.  HPI  82 year old male who presents with palpitations. He has a history of atrial fibrillation on Coumadin, mechanical aortic valve, hypertension, and coronary artery disease. Reports being in his usual state of health. This morning at 7 AM, while at rest began to felt palpitations. Denies any associating chest pain, difficulty breathing, syncope or near syncope. No concerns for dehydration. Reports eating and drinking normally recently. Denies nausea, vomiting, cough, fever, or other recent illnesses. No lower extremity edema or calf tenderness, orthopnea or PND. He has been compliant with his metoprolol, and has not had any missed doses, last dose was this morning.  Past Medical History:  Diagnosis Date  . Atrial fibrillation ([place]) 11/25/2013  . CAD (coronary artery disease) of artery bypass graft 11/25/2013   2000 with multiple bypasses   . Chronic anticoagulation 11/25/2013   Coumadin therapy for mechanical aortic valve. Postoperative atrial fibrillation.   . Essential hypertension 11/25/2013  . H/O mechanical aortic valve replacement 11/25/2013   St. Jude bowel prosthesis 2000   . Premature ventricular contractions 11/25/2013    Patient Active Problem List   Diagnosis Date Noted  . Muscle weakness 12/13/2014  . Atrial fibrillation ([place]) 11/25/2013  . Essential hypertension 11/25/2013  . H/O mechanical aortic valve replacement 11/25/2013  . CAD (coronary artery disease) of artery bypass graft 11/25/2013  . Chronic anticoagulation 11/25/2013  . Premature ventricular contractions 11/25/2013    History reviewed. No pertinent surgical history.     Home Medications    Prior to Admission medications   Medication Sig Start Date End Date Taking? Authorizing Provider   amLODipine (NORVASC) 5 MG tablet Take 5 mg by mouth daily.   Yes Historical Provider, MD  aspirin EC 81 MG tablet Take 1 tablet (81 mg total) by mouth every Monday, Wednesday, and Friday. Patient taking differently: Take 81 mg by mouth daily.  12/20/14  Yes Belva Crome, MD  cholecalciferol (VITAMIN D) 1000 UNITS tablet Take 2,000 Units by mouth daily.    Yes Historical Provider, MD  COUMADIN 5 MG tablet Take 5-7.5 mg by mouth See admin instructions. Pt takes 7.5mg  M,W,F - takes 5mg  all other days 09/18/13  Yes Historical Provider, MD  hydrochlorothiazide (MICROZIDE) 12.5 MG capsule Take 12.5 mg by mouth daily. 12/10/14  Yes Historical Provider, MD  Iron TABS Take by mouth daily. Pt not sure of dosage   Yes Historical Provider, MD  levothyroxine (SYNTHROID, LEVOTHROID) 75 MCG tablet Take 75 mcg by mouth daily. 10/06/13  Yes Historical Provider, MD  metoprolol succinate (TOPROL-XL) 100 MG 24 hr tablet Take 100 mg by mouth daily. 09/28/13  Yes Historical Provider, MD  psyllium (METAMUCIL) 58.6 % packet Take 1 packet by mouth daily.   Yes Historical Provider, MD  rosuvastatin (CRESTOR) 5 MG tablet Take 5 mg by mouth daily.   Yes Historical Provider, MD    Family History Family History  Problem Relation Age of Onset  . Hypertension Mother   . Heart attack Mother   . Hypertension Father     Social History Social History  Substance Use Topics  . Smoking status: Former Smoker    Types: Cigarettes  . Smokeless tobacco: Never Used     Comment: quit 57 years ago  .  Alcohol use No     Allergies   Vicodin [hydrocodone-acetaminophen]; Zocor [simvastatin]; Benicar [olmesartan]; Codeine; Lopid [gemfibrozil]; Monopril [fosinopril]; Phenergan [promethazine hcl]; and Prilosec [omeprazole]   Review of Systems Review of Systems 10/14 systems reviewed and are negative other than those stated in the HPI    Physical Exam Updated Vital Signs BP 131/75   Pulse 63   Temp 97.9 F (36.6 C) (Oral)    Resp 16   Ht 5\' 7"  (1.702 m)   Wt 188 lb (85.3 kg)   SpO2 95%   BMI 29.44 kg/m   Physical Exam Physical Exam  Nursing note and vitals reviewed. Constitutional: Well developed, well nourished, non-toxic, and in no acute distress Head: Normocephalic and atraumatic.  Mouth/Throat: Oropharynx is clear and moist.  Neck: Normal range of motion. Neck supple.  Cardiovascular: Tachycardic rate and occasionally irregularly irregular rhythm. No edema  Pulmonary/Chest: Effort normal and breath sounds normal.  Abdominal: Soft. There is no tenderness. There is no rebound and no guarding.  Musculoskeletal: Normal range of motion.  Neurological: Alert, no facial droop, fluent speech, moves all extremities symmetrically Skin: Skin is warm and dry.  Psychiatric: Cooperative   ED Treatments / Results  Labs (all labs ordered are listed, but only abnormal results are displayed) Labs Reviewed  PROTIME-INR - Abnormal; Notable for the following:       Result Value   Prothrombin Time 25.7 (*)    All other components within normal limits  COMPREHENSIVE METABOLIC PANEL - Abnormal; Notable for the following:    Sodium 134 (*)    Chloride 99 (*)    Glucose, Bld 128 (*)    AST 131 (*)    ALT 78 (*)    Total Bilirubin 1.8 (*)    GFR calc non Af Amer 60 (*)    All other components within normal limits  CBC WITH DIFFERENTIAL/PLATELET    EKG  EKG Interpretation  Date/Time:  Friday August 03 2016 11:02:59 EST Ventricular Rate:  104 PR Interval:    QRS Duration: 119 QT Interval:  383 QTC Calculation: 492 R Axis:   -66 Text Interpretation:  Atrial flutter LVH with IVCD, LAD and secondary repol abnrm Borderline prolonged QT interval  h/o PAF Confirmed by Kenzleigh Sedam MD, Ahmani Prehn [phone]) on 08/03/2016 11:56:55 AM       Radiology Dg Chest 2 View  Result Date: 08/03/2016 CLINICAL DATA:  Palpitations and tachycardia this morning. History of coronary artery disease and CABG, atrial fibrillation, aortic valve  replacement. Former smoker. EXAM: CHEST  2 VIEW COMPARISON:  PA and lateral chest x-ray of June 03, 2011. FINDINGS: The lungs are reasonably well inflated. There is no focal infiltrate. There is no pleural effusion. The heart and pulmonary vascularity are normal. There are post CABG and aortic valve replacement changes. There is calcification in the wall of the aortic arch. The sternal wires are intact. There is no pleural effusion. The observed bony thorax exhibits no acute abnormality. There are small air-fluid levels in small bowel loops in the visualized portions of the upper abdomen. IMPRESSION: There is no CHF, pneumonia, nor other acute cardiopulmonary abnormality. Possible small bowel ileus. Thoracic aortic atherosclerosis. Electronically Signed   By: David  Martinique M.D.   On: 08/03/2016 11:37    Procedures .Cardioversion Date/Time: 08/03/2016 4:01 PM Performed by: Brantley Stage DUO Authorized by: Brantley Stage DUO   Consent:    Consent obtained:  Written and verbal   Consent given by:  Patient   Risks discussed:  Cutaneous burn, death, induced arrhythmia and pain   Alternatives discussed:  Rate-control medication Pre-procedure details:    Cardioversion basis:  Elective   Rhythm:  Atrial flutter   Electrode placement:  Anterior-posterior Attempt one:    Cardioversion mode:  Synchronous   Waveform:  Biphasic   Shock (Joules):  200   Shock outcome:  Conversion to normal sinus rhythm Post-procedure details:    Patient status:  Awake   Patient tolerance of procedure:  Tolerated well, no immediate complications   Procedural sedation Performed by: Forde Dandy Consent: Verbal consent obtained. Risks and benefits: risks, benefits and alternatives were discussed Required items: required blood products, implants, devices, and special equipment available Patient identity confirmed: arm band and provided demographic data Time out: Immediately prior to procedure a "time out" was called to  verify the correct patient, procedure, equipment, support staff and site/side marked as required.  Sedation type: moderate (conscious) sedation NPO time confirmed and considedered  Sedatives: ETOMIDATE  Physician Time at Bedside: 25 minutes  Vitals: Vital signs were monitored during sedation. Cardiac Monitor, pulse oximeter Patient tolerance: Patient tolerated the procedure well with no immediate complications. Comments: Pt with uneventful recovered. Returned to pre-procedural sedation baseline  CRITICAL CARE Performed by: Forde Dandy   Total critical care time: 45 minutes  Critical care time was exclusive of separately billable procedures and treating other patients.  Critical care was necessary to treat or prevent imminent or life-threatening deterioration.  Critical care was time spent personally by me on the following activities: development of treatment plan with patient and/or surrogate as well as nursing, discussions with consultants, evaluation of patient's response to treatment, examination of patient, obtaining history from patient or surrogate, ordering and performing treatments and interventions, ordering and review of laboratory studies, ordering and review of radiographic studies, pulse oximetry and re-evaluation of patient's condition.   (including critical care time)  Medications Ordered in ED Medications  etomidate (AMIDATE) injection 12.8 mg (12.8 mg Intravenous Given 08/03/16 1458)     Initial Impression / Assessment and Plan / ED Course  I have reviewed the triage vital signs and the nursing notes.  Pertinent labs & imaging results that were available during my care of the patient were reviewed by me and considered in my medical decision making (see chart for details).  Clinical Course     82 year old male who presents with palpitations. With evidence of atrial flutter w/ 2:1 conduction on EKG and on the cardiac monitor. Occurring this morning at 7AM and  with therapeutic INR of 2.3. Is otherwise well appearing and asymptomatic. Good candidate for cardioversion  This patients CHA2DS2-VASc Score and unadjusted Ischemic Stroke Rate (% per year) is equal to 4.8 % stroke rate/year from a score of 4. Is chronically anticoagulated.  Discussed with Dr. Stanford Breed from cardiology who agrees that patient is a good candidate for cardioversion. See procedure note above regarding procedure. Patient tolerated this well and converted back into normal sinus rhythm. Observed after sedation and back at baseline and tolerating by mouth. He is appropriate for discharge home. I called atrial fibrillation clinic who set up patient with appointment on 08/08/2016. This is discussed with patient.  Blood work notable for no major ultralight or metabolic derangements. He does have a transaminitis, unclear etiology. Not having any GI symptoms. Is asymptomatic. Discussed with patient regarding outpatient follow-up with this with PCP.  The patient appears reasonably screened and/or stabilized for discharge and I doubt any other medical condition or  other [place] requiring further screening, evaluation, or treatment in the ED at this time prior to discharge.  Strict return and follow-up instructions reviewed. He expressed understanding of all discharge instructions and felt comfortable with the plan of care.      Final Clinical Impressions(s) / ED Diagnoses   Final diagnoses:  Typical atrial flutter ([place])  Atrial fibrillation with rapid ventricular response [facility])    New Prescriptions New Prescriptions   No medications on file     Forde Dandy, MD 08/03/16 [place] Zanai Mallari, MD 08/03/16 [id number]

## 2016-08-03 NOTE — ED Triage Notes (Signed)
Pt in from home via [facility] after episode of "hearing heartbeat in R ear" and palpitations. Pt denies cp, n/v or sob. Per EMS, pt remained pain-free during transport, but heart rate was 120, ST. Hx of CABG and Aortic Valve Replacement in 00'. A&ox4, denies pain

## 2016-08-03 NOTE — Sedation Documentation (Signed)
Pt cardioverted at 200J

## 2016-08-03 NOTE — Discharge Instructions (Signed)
You have a follow-up appointment on August 03, 2016 at 10:30 AM at the atrial fibrillation clinic. Please call [phone] prior to your appointment, and they will give you directions on how to get there and where to park.   Your liver function testing was a little bit elevated today. Please follow-up with your primary care doctor to follow.  Return without fail for worsening symptoms, including chest pain, difficulty breathing, intractable vomiting, severe abdominal pain or any other symptoms concerning to you.  Continue taking your medications as usual.

## 2016-08-06 DIAGNOSIS — Z7901 Long term (current) use of anticoagulants: Secondary | ICD-10-CM | POA: Diagnosis not present

## 2016-08-08 ENCOUNTER — Encounter (HOSPITAL_COMMUNITY): Payer: Self-pay | Admitting: Nurse Practitioner

## 2016-08-08 ENCOUNTER — Ambulatory Visit (HOSPITAL_COMMUNITY)
Admit: 2016-08-08 | Discharge: 2016-08-08 | Disposition: A | Discharge status: Home / Self Care | Payer: Medicare Other | Attending: Nurse Practitioner | Admitting: Nurse Practitioner

## 2016-08-08 VITALS — BP 140/74 | HR 66 | Ht 67.0 in | Wt 189.0 lb

## 2016-08-08 DIAGNOSIS — Z7982 Long term (current) use of aspirin: Secondary | ICD-10-CM | POA: Diagnosis not present

## 2016-08-08 DIAGNOSIS — I25810 Atherosclerosis of coronary artery bypass graft(s) without angina pectoris: Secondary | ICD-10-CM | POA: Insufficient documentation

## 2016-08-08 DIAGNOSIS — Z952 Presence of prosthetic heart valve: Secondary | ICD-10-CM | POA: Diagnosis not present

## 2016-08-08 DIAGNOSIS — I4892 Unspecified atrial flutter: Secondary | ICD-10-CM | POA: Diagnosis not present

## 2016-08-08 DIAGNOSIS — I10 Essential (primary) hypertension: Secondary | ICD-10-CM | POA: Diagnosis not present

## 2016-08-08 DIAGNOSIS — I4891 Unspecified atrial fibrillation: Secondary | ICD-10-CM | POA: Diagnosis not present

## 2016-08-08 DIAGNOSIS — Z7901 Long term (current) use of anticoagulants: Secondary | ICD-10-CM | POA: Diagnosis not present

## 2016-08-08 DIAGNOSIS — I483 Typical atrial flutter: Secondary | ICD-10-CM

## 2016-08-08 DIAGNOSIS — I491 Atrial premature depolarization: Secondary | ICD-10-CM | POA: Insufficient documentation

## 2016-08-08 DIAGNOSIS — Z87891 Personal history of nicotine dependence: Secondary | ICD-10-CM | POA: Insufficient documentation

## 2016-08-08 DIAGNOSIS — R945 Abnormal results of liver function studies: Secondary | ICD-10-CM | POA: Insufficient documentation

## 2016-08-08 NOTE — Progress Notes (Signed)
Primary Care Physician: Henrine Screws, MD Referring Physician: [facility] ER Cardiologist: Dr. Olen Cordial is a 82 y.o. male  In the afib clinic for f/u post ER visit for a flutter with RVR and successful cardioversion. He has a history of atrial fibrillation present short term after heart surgery, 11/2013, on Coumadin for  mechanical aortic valve, hypertension, and coronary artery disease. Reports being in his usual state of health  Morning of 12/1, when at   7 AM, while at rest began to hear a rapid heart beat in his ear. Since receiving his mecahnical valvue, he is  Aware of hearing his heart beat. He did not feel different. No know triggers. INR was therapeutic in the ER.  IN the afib clinc he has not noted any further rapid heart beat. He feels well. He has not seen Dr. Tamala Julian in over a year and will make f/u. Last echo was in April 2016. Sees his pcp on a regular basis and has thyroid checked there, is on replacement. Liver enzymes elevated in the ER but I do not history of labs to see if this is his baseline, will forward to PCP.  Today, he denies symptoms of palpitations, chest pain, shortness of breath, orthopnea, PND, lower extremity edema, dizziness, presyncope, syncope, or neurologic sequela. The patient is tolerating medications without difficulties and is otherwise without complaint today.   Past Medical History:  Diagnosis Date  . Atrial fibrillation ([place]) 11/25/2013  . CAD (coronary artery disease) of artery bypass graft 11/25/2013   2000 with multiple bypasses   . Chronic anticoagulation 11/25/2013   Coumadin therapy for mechanical aortic valve. Postoperative atrial fibrillation.   . Essential hypertension 11/25/2013  . H/O mechanical aortic valve replacement 11/25/2013   St. Jude bowel prosthesis 2000   . Premature ventricular contractions 11/25/2013   No past surgical history on file.  Current Outpatient Prescriptions  Medication Sig Dispense Refill  . amLODipine  (NORVASC) 5 MG tablet Take 5 mg by mouth daily.    [name] aspirin EC 81 MG tablet Take 81 mg by mouth daily.    . cholecalciferol (VITAMIN D) 1000 UNITS tablet Take 2,000 Units by mouth daily.     [name] COUMADIN 5 MG tablet Take 5-7.5 mg by mouth See admin instructions. Pt takes 7.5mg  M,W,F - takes 5mg  all other days    . hydrochlorothiazide (MICROZIDE) 12.5 MG capsule Take 12.5 mg by mouth daily.    . Iron TABS Take by mouth daily. Pt not sure of dosage    . levothyroxine (SYNTHROID, LEVOTHROID) 75 MCG tablet Take 75 mcg by mouth daily.    . metoprolol succinate (TOPROL-XL) 100 MG 24 hr tablet Take 100 mg by mouth daily.    . psyllium (METAMUCIL) 58.6 % packet Take 1 packet by mouth daily.    . rosuvastatin (CRESTOR) 5 MG tablet Take 5 mg by mouth daily.     No current facility-administered medications for this encounter.     Allergies  Allergen Reactions  . Vicodin [Hydrocodone-Acetaminophen]     Severe sensitivity  . Zocor [Simvastatin]     Short memory loss.  [name] [Olmesartan]     Abdominal cramping and increased stools/ irritable bowel  . Codeine Nausea And Vomiting  . Lopid [Gemfibrozil] Other (See Comments)    transaminitis  . Monopril [Fosinopril]     transaminitis  . Phenergan [Promethazine Hcl]     Severe somnolence.  . Prilosec [Omeprazole]     dyspepsia  Social History   Social History  . Marital status: Married    Spouse name: N/A  . Number of children: N/A  . Years of education: N/A   Occupational History  . Not on file.   Social History Main Topics  . Smoking status: Former Smoker    Types: Cigarettes  . Smokeless tobacco: Never Used     Comment: quit 57 years ago  . Alcohol use No  . Drug use: No  . Sexual activity: Not on file   Other Topics Concern  . Not on file   Social History Narrative  . No narrative on file    Family History  Problem Relation Age of Onset  . Hypertension Mother   . Heart attack Mother   . Hypertension Father      ROS- All systems are reviewed and negative except as per the HPI above  Physical Exam: Vitals:   08/08/16 1041  BP: 140/74  Pulse: 66  Weight: 189 lb (85.7 kg)  Height: 5\' 7"  (1.702 m)   Wt Readings from Last 3 Encounters:  08/08/16 189 lb (85.7 kg)  08/03/16 188 lb (85.3 kg)  12/13/14 188 lb 1.9 oz (85.3 kg)    Labs: Lab Results  Component Value Date   NA 134 (L) 08/03/2016   K 4.1 08/03/2016   CL 99 (L) 08/03/2016   CO2 26 08/03/2016   GLUCOSE 128 (H) 08/03/2016   BUN 11 08/03/2016   CREATININE 1.11 08/03/2016   CALCIUM 9.9 08/03/2016   Lab Results  Component Value Date   INR 2.30 08/03/2016   No results found for: CHOL, HDL, LDLCALC, TRIG   GEN- The patient is well appearing, alert and oriented x 3 today.   Head- normocephalic, atraumatic Eyes-  Sclera clear, conjunctiva pink Ears- hearing intact Oropharynx- clear Neck- supple, no JVP Lymph- no cervical lymphadenopathy Lungs- Clear to ausculation bilaterally, normal work of breathing Heart- Regular rate and rhythm, no murmurs, rubs or gallops, PMI not laterally displaced GI- soft, NT, ND, + BS Extremities- no clubbing, cyanosis, or edema MS- no significant deformity or atrophy Skin- no rash or lesion Psych- euthymic mood, full affect Neuro- strength and sensation are intact  EKG- SR at 66 bpm, LAD pr int 156 ms, qrs int 96 ms, qtc448 ms Records reviewed  Echo 12/2014-Study Conclusions  - Left ventricle: The cavity size was normal. Systolic function was normal. Wall motion was normal; there were no regional wall motion abnormalities. - Aortic valve: There is some vague thickening in the vicinity of the AVR as well as a very thin mobile density. The valve is not well visualized and cannot tell whether this is within the mechanical prosthesis itself or if it is above the AVR. IN the parasternal view there is a very small mobile target that moves in and out of the LVOT that could be a  redundant chordae tendinae but cannot rule out that this origniates from the AVR. Could consider TEE for better visualization. A mechanical prosthesis was present. Mean gradient (S): 9 mm Hg. Peak gradient (S): 14 mm Hg. - Aorta: Ascending aortic diameter: 39 mm (S). - Ascending aorta: The ascending aorta was mildly dilated. - Mitral valve: There is a mobilve density that appears to be off of the anterior mitral valve chordae tendinae that is most likely secondary to redundant chordae tendinae. Calcified annulus. Mild diffuse calcification. There was mild regurgitation. - Pulmonic valve: There was trivial regurgitation. - Pulmonary arteries: PA peak pressure: 46 mm  Hg (S).  Impressions:  - The right ventricular systolic pressure was increased consistent with moderate pulmonary hypertension.  CMET-  5d ago 9yr ago     Sodium 135 - 145 mmol/L 134   137R    Potassium 3.5 - 5.1 mmol/L 4.1  4.7R    Chloride 101 - 111 mmol/L 99   102R    CO2 22 - 32 mmol/L 26  29R    Glucose, Bld 65 - 99 mg/dL 128   100R     BUN 6 - 20 mg/dL 11  16R    Creatinine, Ser 0.61 - 1.24 mg/dL 1.11  1.21R    Calcium 8.9 - 10.3 mg/dL 9.9  9.7R    Total Protein 6.5 - 8.1 g/dL 8.0     Albumin 3.5 - 5.0 g/dL 3.9     AST 15 - 41 U/L 131      ALT 17 - 63 U/L 78      Alkaline Phosphatase 38 - 126 U/L 64     Total Bilirubin 0.3 - 1.2 mg/dL 1.8      GFR calc non Af Amer >60 mL/min 60        Assessment and Plan: 1. Atrial Flutter with RVR Cardioverted successfully in the ER Maintaining SR  If ERAF  may need antiarrythmis's No triggers identified and no outstanding lifestyle contributors. Continue BB/ [place]ain  2. Elevated liver ezymes noted on ER labs Will forward to PCP, I have no labs to compare for baseline  Will request f/u with Dr Prudy Feeler clinic as needed   Geroge Baseman. Khloei Spiker, [facility] [address] [place], [place] [address] [phone]

## 2016-09-04 DIAGNOSIS — Z7901 Long term (current) use of anticoagulants: Secondary | ICD-10-CM | POA: Diagnosis not present

## 2016-10-02 DIAGNOSIS — I10 Essential (primary) hypertension: Secondary | ICD-10-CM | POA: Diagnosis not present

## 2016-10-02 DIAGNOSIS — Z7901 Long term (current) use of anticoagulants: Secondary | ICD-10-CM | POA: Diagnosis not present

## 2016-10-02 DIAGNOSIS — Z952 Presence of prosthetic heart valve: Secondary | ICD-10-CM | POA: Diagnosis not present

## 2016-10-30 DIAGNOSIS — Z7901 Long term (current) use of anticoagulants: Secondary | ICD-10-CM | POA: Diagnosis not present

## 2016-11-12 ENCOUNTER — Encounter: Payer: Self-pay | Admitting: Interventional Cardiology

## 2016-11-12 ENCOUNTER — Encounter (INDEPENDENT_AMBULATORY_CARE_PROVIDER_SITE_OTHER): Payer: Self-pay

## 2016-11-12 ENCOUNTER — Ambulatory Visit (INDEPENDENT_AMBULATORY_CARE_PROVIDER_SITE_OTHER): Payer: Medicare Other | Admitting: Interventional Cardiology

## 2016-11-12 VITALS — BP 150/82 | HR 64 | Ht 70.0 in | Wt 189.1 lb

## 2016-11-12 DIAGNOSIS — I4892 Unspecified atrial flutter: Secondary | ICD-10-CM | POA: Diagnosis not present

## 2016-11-12 DIAGNOSIS — I209 Angina pectoris, unspecified: Secondary | ICD-10-CM | POA: Diagnosis not present

## 2016-11-12 DIAGNOSIS — Z952 Presence of prosthetic heart valve: Secondary | ICD-10-CM

## 2016-11-12 DIAGNOSIS — I10 Essential (primary) hypertension: Secondary | ICD-10-CM

## 2016-11-12 DIAGNOSIS — I25709 Atherosclerosis of coronary artery bypass graft(s), unspecified, with unspecified angina pectoris: Secondary | ICD-10-CM | POA: Diagnosis not present

## 2016-11-12 DIAGNOSIS — Z7901 Long term (current) use of anticoagulants: Secondary | ICD-10-CM

## 2016-11-12 NOTE — Progress Notes (Signed)
Cardiology Office Note    Date:  11/12/2016   ID:  Jason Santana, DOB [birth year], MRN [id number]  PCP:  Henrine Screws, MD  Cardiologist: Sinclair Grooms, MD   Chief Complaint  Patient presents with  . Cardiac Valve Problem    History of Present Illness:  Jason Santana is a 83 y.o. male who presents for valvular heart disease with prior mechanical aortic valve replacement (2000), chronic atrial fibrillation,, chronic anticoagulation therapy, and coronary artery disease.  Jason Santana is doing well. No cardiopulmonary complaints. He did identify irregularity and racing of his heart beat because he is able to hear his mechanical valve. He then went to the fire station to have his vital signs performed and an EKG was performed, revealing typical atrial flutter. He underwent electrical cardioversion in the emergency department. He has had no recurrence since that time. No medication adjustments were made.  Past Medical History:  Diagnosis Date  . Atrial fibrillation ([place]) 11/25/2013  . CAD (coronary artery disease) of artery bypass graft 11/25/2013   2000 with multiple bypasses   . Chronic anticoagulation 11/25/2013   Coumadin therapy for mechanical aortic valve. Postoperative atrial fibrillation.   . Essential hypertension 11/25/2013  . H/O mechanical aortic valve replacement 11/25/2013   St. Jude bowel prosthesis 2000   . Premature ventricular contractions 11/25/2013    No past surgical history on file.  Current Medications: Outpatient Medications Prior to Visit  Medication Sig Dispense Refill  . amLODipine (NORVASC) 5 MG tablet Take 5 mg by mouth daily.    [name] aspirin EC 81 MG tablet Take 81 mg by mouth daily.    . cholecalciferol (VITAMIN D) 1000 UNITS tablet Take 2,000 Units by mouth daily.     [name] COUMADIN 5 MG tablet Take 5-7.5 mg by mouth See admin instructions. Pt takes 7.5mg  M,W,F - takes 5mg  all other days    . hydrochlorothiazide (MICROZIDE) 12.5 MG capsule Take 12.5 mg by  mouth daily.    . Iron TABS Take by mouth daily. Pt not sure of dosage    . levothyroxine (SYNTHROID, LEVOTHROID) 75 MCG tablet Take 75 mcg by mouth daily.    . metoprolol succinate (TOPROL-XL) 100 MG 24 hr tablet Take 100 mg by mouth daily.    . psyllium (METAMUCIL) 58.6 % packet Take 1 packet by mouth daily.    . rosuvastatin (CRESTOR) 5 MG tablet Take 5 mg by mouth daily.     No facility-administered medications prior to visit.      Allergies:   Vicodin [hydrocodone-acetaminophen]; Zocor [simvastatin]; Benicar [olmesartan]; Codeine; Lopid [gemfibrozil]; Monopril [fosinopril]; Phenergan [promethazine hcl]; and Prilosec [omeprazole]   Social History   Social History  . Marital status: Married    Spouse name: N/A  . Number of children: N/A  . Years of education: N/A   Social History Main Topics  . Smoking status: Former Smoker    Types: Cigarettes  . Smokeless tobacco: Never Used     Comment: quit 57 years ago  . Alcohol use No  . Drug use: No  . Sexual activity: Not Asked   Other Topics Concern  . None   Social History Narrative  . None     Family History:  The patient's family history includes Heart attack in his mother; Hypertension in his father and mother.   ROS:   Please see the history of present illness.    Significant in energy. No falls or head trauma. No blood in the urine  or stool.  All other systems reviewed and are negative.   PHYSICAL EXAM:   VS:  BP (!) 150/82 (BP Location: Left Arm)   Pulse 64   Ht 5\' 10"  (1.778 m)   Wt 189 lb 1.9 oz (85.8 kg)   BMI 27.14 kg/m    Repeat blood pressure 140/70 mmHg left and right arm.  GEN: Well nourished, well developed, in no acute distress  HEENT: normal  Neck: no JVD, carotid bruits, or masses Cardiac: RRR; 2/6 systolic outflow murmur, mechanical valve closure sounds. No rub or diastolic murmur. An S4 gallop is present. There is no edema . Respiratory:  clear to auscultation bilaterally, normal work of  breathing GI: soft, nontender, nondistended, + BS MS: no deformity or atrophy  Skin: warm and dry, no rash Neuro:  Alert and Oriented x 3, Strength and sensation are intact Psych: euthymic mood, full affect  Wt Readings from Last 3 Encounters:  11/12/16 189 lb 1.9 oz (85.8 kg)  08/08/16 189 lb (85.7 kg)  08/03/16 188 lb (85.3 kg)      Studies/Labs Reviewed:   EKG:  EKG  Reviewed the EKG from 08/03/16 which revealed atrial flutter/tachycardia with a ventricular response of 117 bpm an atrial rate proximally 240 bpm. Electrical cardioversion led to return of sinus rhythm. Left axis deviation with left atrial abnormality was noted.  Recent Labs: 08/03/2016: ALT 78; BUN 11; Creatinine, Ser 1.11; Hemoglobin 15.5; Platelets 206; Potassium 4.1; Sodium 134   Lipid Panel No results found for: CHOL, TRIG, HDL, CHOLHDL, VLDL, LDLCALC, LDLDIRECT  Additional studies/ records that were reviewed today include:  No new studies other than EKG as outlined above.    ASSESSMENT:    1. Paroxysmal atrial flutter ([place])   2. Coronary artery disease involving coronary bypass graft of native heart with angina pectoris ([place])   3. Essential hypertension   4. H/O mechanical aortic valve replacement   5. Chronic anticoagulation      PLAN:  In order of problems listed above:  1. Reverted with electrical cardioversion in December. No antiarrhythmic therapy was given. 2. Stable without angina pectoris or other complaints. 3. Systolic blood pressure bears watching, 140/70 noted today. Initially a little higher. Watch this and goal is to keep systolic at 174 or less. 4. Normally functioning mechanical aortic valve. 5. Coumadin clinic with follow-up and stable blood work. Followed at [facility] by Dr. Mertha Finders.  Overall plan is to monitor for recurrences of atrial flutter. Since he identified the initial episode, no formal telemetry will be used. Clinical follow-up with me in one year. Cautioned him that  palpitations, unexplained fatigue, dyspnea, and chest pain could be associated with rhythm disturbance.  Medication Adjustments/Labs and Tests Ordered: Current medicines are reviewed at length with the patient today.  Concerns regarding medicines are outlined above.  Medication changes, Labs and Tests ordered today are listed in the Patient Instructions below. Patient Instructions  Medication Instructions:  None  Labwork: None  Testing/Procedures: None  Follow-Up: Your physician wants you to follow-up in: 1 year with Dr. Tamala Julian.  You will receive a reminder letter in the mail two months in advance. If you don't receive a letter, please call our office to schedule the follow-up appointment.   Any Other Special Instructions Will Be Listed Below (If Applicable).     If you need a refill on your cardiac medications before your next appointment, please call your pharmacy.      Signed, Sinclair Grooms, MD  11/12/2016 11:41 AM    [address] [place], [place], [place]  [address] Phone: [phone]; Fax: [phone]

## 2016-11-12 NOTE — Patient Instructions (Signed)

## 2016-11-27 DIAGNOSIS — Z7901 Long term (current) use of anticoagulants: Secondary | ICD-10-CM | POA: Diagnosis not present

## 2016-12-25 DIAGNOSIS — E782 Mixed hyperlipidemia: Secondary | ICD-10-CM | POA: Diagnosis not present

## 2016-12-25 DIAGNOSIS — Z7901 Long term (current) use of anticoagulants: Secondary | ICD-10-CM | POA: Diagnosis not present

## 2017-01-07 DIAGNOSIS — R300 Dysuria: Secondary | ICD-10-CM | POA: Diagnosis not present

## 2017-01-07 DIAGNOSIS — R310 Gross hematuria: Secondary | ICD-10-CM | POA: Diagnosis not present

## 2017-01-10 DIAGNOSIS — Z7901 Long term (current) use of anticoagulants: Secondary | ICD-10-CM | POA: Diagnosis not present

## 2017-01-22 DIAGNOSIS — R310 Gross hematuria: Secondary | ICD-10-CM | POA: Diagnosis not present

## 2017-01-22 DIAGNOSIS — Z7901 Long term (current) use of anticoagulants: Secondary | ICD-10-CM | POA: Diagnosis not present

## 2017-01-31 DIAGNOSIS — Z7901 Long term (current) use of anticoagulants: Secondary | ICD-10-CM | POA: Diagnosis not present

## 2017-02-06 DIAGNOSIS — Z7901 Long term (current) use of anticoagulants: Secondary | ICD-10-CM | POA: Diagnosis not present

## 2017-02-11 DIAGNOSIS — N390 Urinary tract infection, site not specified: Secondary | ICD-10-CM | POA: Diagnosis not present

## 2017-02-11 DIAGNOSIS — Z7901 Long term (current) use of anticoagulants: Secondary | ICD-10-CM | POA: Diagnosis not present

## 2017-02-13 DIAGNOSIS — R8290 Unspecified abnormal findings in urine: Secondary | ICD-10-CM | POA: Diagnosis not present

## 2017-03-11 DIAGNOSIS — Z7901 Long term (current) use of anticoagulants: Secondary | ICD-10-CM | POA: Diagnosis not present

## 2017-04-08 DIAGNOSIS — Z7901 Long term (current) use of anticoagulants: Secondary | ICD-10-CM | POA: Diagnosis not present

## 2017-04-19 DIAGNOSIS — H3562 Retinal hemorrhage, left eye: Secondary | ICD-10-CM | POA: Diagnosis not present

## 2017-04-24 DIAGNOSIS — R300 Dysuria: Secondary | ICD-10-CM | POA: Diagnosis not present

## 2017-04-30 DIAGNOSIS — H353111 Nonexudative age-related macular degeneration, right eye, early dry stage: Secondary | ICD-10-CM | POA: Diagnosis not present

## 2017-04-30 DIAGNOSIS — H3563 Retinal hemorrhage, bilateral: Secondary | ICD-10-CM | POA: Diagnosis not present

## 2017-04-30 DIAGNOSIS — H35361 Drusen (degenerative) of macula, right eye: Secondary | ICD-10-CM | POA: Diagnosis not present

## 2017-04-30 DIAGNOSIS — H353221 Exudative age-related macular degeneration, left eye, with active choroidal neovascularization: Secondary | ICD-10-CM | POA: Diagnosis not present

## 2017-05-07 DIAGNOSIS — D509 Iron deficiency anemia, unspecified: Secondary | ICD-10-CM | POA: Diagnosis not present

## 2017-05-07 DIAGNOSIS — N183 Chronic kidney disease, stage 3 (moderate): Secondary | ICD-10-CM | POA: Diagnosis not present

## 2017-05-07 DIAGNOSIS — Z0000 Encounter for general adult medical examination without abnormal findings: Secondary | ICD-10-CM | POA: Diagnosis not present

## 2017-05-07 DIAGNOSIS — Z952 Presence of prosthetic heart valve: Secondary | ICD-10-CM | POA: Diagnosis not present

## 2017-05-07 DIAGNOSIS — I10 Essential (primary) hypertension: Secondary | ICD-10-CM | POA: Diagnosis not present

## 2017-05-07 DIAGNOSIS — E039 Hypothyroidism, unspecified: Secondary | ICD-10-CM | POA: Diagnosis not present

## 2017-05-07 DIAGNOSIS — G609 Hereditary and idiopathic neuropathy, unspecified: Secondary | ICD-10-CM | POA: Diagnosis not present

## 2017-05-07 DIAGNOSIS — Z1389 Encounter for screening for other disorder: Secondary | ICD-10-CM | POA: Diagnosis not present

## 2017-05-07 DIAGNOSIS — E559 Vitamin D deficiency, unspecified: Secondary | ICD-10-CM | POA: Diagnosis not present

## 2017-05-07 DIAGNOSIS — I2510 Atherosclerotic heart disease of native coronary artery without angina pectoris: Secondary | ICD-10-CM | POA: Diagnosis not present

## 2017-05-07 DIAGNOSIS — Z7901 Long term (current) use of anticoagulants: Secondary | ICD-10-CM | POA: Diagnosis not present

## 2017-05-07 DIAGNOSIS — Z79899 Other long term (current) drug therapy: Secondary | ICD-10-CM | POA: Diagnosis not present

## 2017-05-07 DIAGNOSIS — R945 Abnormal results of liver function studies: Secondary | ICD-10-CM | POA: Diagnosis not present

## 2017-05-07 DIAGNOSIS — E782 Mixed hyperlipidemia: Secondary | ICD-10-CM | POA: Diagnosis not present

## 2017-05-07 DIAGNOSIS — I482 Chronic atrial fibrillation: Secondary | ICD-10-CM | POA: Diagnosis not present

## 2017-05-25 ENCOUNTER — Encounter (HOSPITAL_COMMUNITY): Payer: Self-pay | Admitting: Emergency Medicine

## 2017-05-25 ENCOUNTER — Inpatient Hospital Stay (HOSPITAL_COMMUNITY)
Admission: EM | Admit: 2017-05-25 | Discharge: 2017-05-29 | DRG: 872 | Disposition: A | Discharge status: Home / Self Care | Payer: Medicare Other | Attending: Internal Medicine | Admitting: Internal Medicine

## 2017-05-25 DIAGNOSIS — Z87891 Personal history of nicotine dependence: Secondary | ICD-10-CM

## 2017-05-25 DIAGNOSIS — I25810 Atherosclerosis of coronary artery bypass graft(s) without angina pectoris: Secondary | ICD-10-CM | POA: Diagnosis not present

## 2017-05-25 DIAGNOSIS — Z952 Presence of prosthetic heart valve: Secondary | ICD-10-CM

## 2017-05-25 DIAGNOSIS — A4151 Sepsis due to Escherichia coli [E. coli]: Secondary | ICD-10-CM | POA: Diagnosis not present

## 2017-05-25 DIAGNOSIS — I4892 Unspecified atrial flutter: Secondary | ICD-10-CM | POA: Diagnosis not present

## 2017-05-25 DIAGNOSIS — Z23 Encounter for immunization: Secondary | ICD-10-CM | POA: Diagnosis not present

## 2017-05-25 DIAGNOSIS — E039 Hypothyroidism, unspecified: Secondary | ICD-10-CM | POA: Diagnosis present

## 2017-05-25 DIAGNOSIS — Z885 Allergy status to narcotic agent status: Secondary | ICD-10-CM

## 2017-05-25 DIAGNOSIS — I482 Chronic atrial fibrillation: Secondary | ICD-10-CM | POA: Diagnosis present

## 2017-05-25 DIAGNOSIS — E871 Hypo-osmolality and hyponatremia: Secondary | ICD-10-CM | POA: Diagnosis present

## 2017-05-25 DIAGNOSIS — N390 Urinary tract infection, site not specified: Secondary | ICD-10-CM | POA: Diagnosis present

## 2017-05-25 DIAGNOSIS — H3530 Unspecified macular degeneration: Secondary | ICD-10-CM | POA: Diagnosis present

## 2017-05-25 DIAGNOSIS — I10 Essential (primary) hypertension: Secondary | ICD-10-CM | POA: Diagnosis not present

## 2017-05-25 DIAGNOSIS — A419 Sepsis, unspecified organism: Secondary | ICD-10-CM | POA: Diagnosis present

## 2017-05-25 DIAGNOSIS — Z7901 Long term (current) use of anticoagulants: Secondary | ICD-10-CM

## 2017-05-25 DIAGNOSIS — Z87440 Personal history of urinary (tract) infections: Secondary | ICD-10-CM

## 2017-05-25 DIAGNOSIS — Z7982 Long term (current) use of aspirin: Secondary | ICD-10-CM

## 2017-05-25 DIAGNOSIS — Z66 Do not resuscitate: Secondary | ICD-10-CM | POA: Diagnosis present

## 2017-05-25 DIAGNOSIS — Z888 Allergy status to other drugs, medicaments and biological substances status: Secondary | ICD-10-CM

## 2017-05-25 DIAGNOSIS — Z8249 Family history of ischemic heart disease and other diseases of the circulatory system: Secondary | ICD-10-CM

## 2017-05-25 DIAGNOSIS — E785 Hyperlipidemia, unspecified: Secondary | ICD-10-CM | POA: Diagnosis present

## 2017-05-25 LAB — URINALYSIS, ROUTINE W REFLEX MICROSCOPIC
BILIRUBIN URINE: NEGATIVE
Glucose, UA: NEGATIVE mg/dL
Hgb urine dipstick: NEGATIVE
Ketones, ur: 5 mg/dL — AB
NITRITE: NEGATIVE
PH: 7 (ref 5.0–8.0)
PROTEIN: NEGATIVE mg/dL
SQUAMOUS EPITHELIAL / LPF: NONE SEEN
Specific Gravity, Urine: 1.014 (ref 1.005–1.030)

## 2017-05-25 LAB — COMPREHENSIVE METABOLIC PANEL
ALT: 45 U/L (ref 17–63)
AST: 82 U/L — AB (ref 15–41)
Albumin: 4.1 g/dL (ref 3.5–5.0)
Alkaline Phosphatase: 65 U/L (ref 38–126)
Anion gap: 8 (ref 5–15)
BILIRUBIN TOTAL: 2.5 mg/dL — AB (ref 0.3–1.2)
BUN: 16 mg/dL (ref 6–20)
CO2: 24 mmol/L (ref 22–32)
Calcium: 9.2 mg/dL (ref 8.9–10.3)
Chloride: 96 mmol/L — ABNORMAL LOW (ref 101–111)
Creatinine, Ser: 1.18 mg/dL (ref 0.61–1.24)
GFR calc Af Amer: >60 mL/min (ref >60)
GFR, EST NON AFRICAN AMERICAN: 56 mL/min — AB (ref >60)
Glucose, Bld: 152 mg/dL — ABNORMAL HIGH (ref 65–99)
POTASSIUM: 3.9 mmol/L (ref 3.5–5.1)
Sodium: 128 mmol/L — ABNORMAL LOW (ref 135–145)
TOTAL PROTEIN: 8.1 g/dL (ref 6.5–8.1)

## 2017-05-25 LAB — CBC
HEMATOCRIT: 41.1 % (ref 39.0–52.0)
Hemoglobin: 14.1 g/dL (ref 13.0–17.0)
MCH: 34.1 pg — ABNORMAL HIGH (ref 26.0–34.0)
MCHC: 34.3 g/dL (ref 30.0–36.0)
MCV: 99.5 fL (ref 78.0–100.0)
Platelets: 197 10*3/uL (ref 150–400)
RBC: 4.13 MIL/uL — ABNORMAL LOW (ref 4.22–5.81)
RDW: 12.9 % (ref 11.5–15.5)
WBC: 20.3 10*3/uL — AB (ref 4.0–10.5)

## 2017-05-25 LAB — LIPASE, BLOOD: Lipase: 27 U/L (ref 11–51)

## 2017-05-25 LAB — I-STAT CG4 LACTIC ACID, ED: Lactic Acid, Venous: 1.36 mmol/L (ref 0.5–1.9)

## 2017-05-25 MED ORDER — DEXTROSE 5 % IV SOLN
2.0000 g | Freq: Once | INTRAVENOUS | Status: AC
  Administered 2017-05-25: 2 g via INTRAVENOUS
  Filled 2017-05-25: qty 2

## 2017-05-25 MED ORDER — ACETAMINOPHEN 325 MG PO TABS
650.0000 mg | ORAL_TABLET | Freq: Once | ORAL | Status: AC | PRN
  Administered 2017-05-25: 650 mg via ORAL
  Filled 2017-05-25: qty 2

## 2017-05-25 MED ORDER — SULFAMETHOXAZOLE-TRIMETHOPRIM 400-80 MG PO TABS: 1.0000 | ORAL_TABLET | Freq: Two times a day (BID) | ORAL | 0 refills | Status: DC

## 2017-05-25 NOTE — Discharge Instructions (Signed)
While taking Bactrim, Keep your coumadin dosage at 5 mg daily. After completing the bactrim, resume your prior dosing.

## 2017-05-25 NOTE — H&P (Signed)
Jason Santana RJJ:[id number] DOB: Apr 17, 1932 DOA: 05/25/2017     PCP: Jason Huddle, MD   Outpatient Specialists: cardiology Jason Santana Patient coming from  home Lives  With family    Chief Complaint: Urinary complaints and shaking chills HPI: Jason Santana is a 83 y.o. male with medical history significant of mechanical aortic valve replacement (2000), chronic atrial fibrillation, chronic anticoagulation therapy, and coronary artery disease, HTN    Presented with dysuria frequency in urination and suprapubic abdominal pain and Rigors patient has been seen by his primary care provider in the past that check his urine and that time which showed no evidence of infection but his symptoms have persisted his been having intermittent chills for at least 4 weeks. Today he developed outright fever in the past patient have had history of UTIs which were difficult to treat requiring 2 courses of antibiotics Maisie chest pain and shortness of breath no nausea vomiting no diarrhea. In emergency department patient initially has improved since administration of IV fluids but then developed rigors again and had an episode of incontinence secondary to severe rigors  He reports one day of back pain this this week he thought it was due to picking up something. He reports decreased PO intake but have been drinking plenty of water.  He feels that his bladder is not fully emptying. He has to urinate multiple times to empty.  Regarding pertinent Chronic problems: History of coronary artery disease requiring multiple bypasses History of atrial fibrillation and mechanical aortic valve on Coumadin  IN ER:  Temp (24hrs), Avg:101 F (38.3 C), Min:100.6 F (38.1 C), Max:101.3 F (38.5 C)      on arrival  ED Triage Vitals  Enc Vitals Group     BP 05/25/17 1902 (!) 175/66     Pulse Rate 05/25/17 1902 79     Resp 05/25/17 1902 18     Temp 05/25/17 1902 (!) 101.3 F (38.5 C)     Temp Source 05/25/17 1902 Oral     SpO2 05/25/17 1902 94 %     Weight 05/25/17 2150 178 lb (80.7 kg)     Height 05/25/17 2150 5\' 10"  (1.778 m)     Head Circumference --      Peak Flow --      Pain Score 05/25/17 1914 5     Pain Loc --      Pain Edu? --      Excl. in [place]? --     Latest  RR 20 93% HR 89 BP 141/87 Lactic acid 1.36 Sodium 128 glucose 152 creatinine 1.18 AST slightly elevated at 82 ALT 45 total bilirubin 2.5 WBC 20.3 hemoglobin 14.1  Following Medications were ordered in ER: Medications  acetaminophen (TYLENOL) tablet 650 mg (650 mg Oral Given 05/25/17 1923)  cefTRIAXone (ROCEPHIN) 2 g in dextrose 5 % 50 mL IVPB (0 g Intravenous Stopped 05/25/17 2305)   Hospitalist was called for admission for Sepsis likely secondary to urinary tract infection  Review of Systems:    Pertinent positives include:  Fevers, chills, fatigue dysuria, o change in color of urine, urgency or frequency. Constitutional:  No weight loss, night sweats,, weight loss  HEENT:  No headaches, Difficulty swallowing,Tooth/dental problems,Sore throat,  No sneezing, itching, ear ache, nasal congestion, post nasal drip,  Cardio-vascular:  No chest pain, Orthopnea, PND, anasarca, dizziness, palpitations.no Bilateral lower extremity swelling  GI:  No heartburn, indigestion, abdominal pain, nausea, vomiting, diarrhea, change in bowel habits, loss of appetite,  melena, blood in stool, hematemesis Resp:  no shortness of breath at rest. No dyspnea on exertion, No excess mucus, no productive cough, No non-productive cough, No coughing up of blood.No change in color of mucus.No wheezing. Skin:  no rash or lesions. No jaundice GU:    No straining to urinate. No flank pain.  Musculoskeletal:  No joint pain or no joint swelling. No decreased range of motion. No back pain.  Psych:  No change in mood or affect. No depression or anxiety. No memory loss.  Neuro: no localizing neurological complaints, no tingling, no weakness, no double vision, no  gait abnormality, no slurred speech, no confusion  As per HPI otherwise 10 point review of systems negative.   Past Medical History: Past Medical History:  Diagnosis Date  . Atrial fibrillation ([place]) 11/25/2013  . CAD (coronary artery disease) of artery bypass graft 11/25/2013   2000 with multiple bypasses   . Chronic anticoagulation 11/25/2013   Coumadin therapy for mechanical aortic valve. Postoperative atrial fibrillation.   . Essential hypertension 11/25/2013  . H/O mechanical aortic valve replacement 11/25/2013   St. Jude bowel prosthesis 2000   . Premature ventricular contractions 11/25/2013   History reviewed. No pertinent surgical history.   Social History:  Ambulatory   Independently       reports that he has quit smoking. His smoking use included Cigarettes. He has never used smokeless tobacco. He reports that he does not drink alcohol or use drugs.  Allergies:   Allergies  Allergen Reactions  . Vicodin [Hydrocodone-Acetaminophen]     Severe sensitivity  . Zocor [Simvastatin]     Short memory loss.  [name] [Olmesartan]     Abdominal cramping and increased stools/ irritable bowel  . Codeine Nausea And Vomiting  . Lopid [Gemfibrozil] Other (See Comments)    transaminitis  . Monopril [Fosinopril]     transaminitis  . Phenergan [Promethazine Hcl]     Severe somnolence.  . Prilosec [Omeprazole]     dyspepsia       Family History:   Family History  Problem Relation Age of Onset  . Hypertension Mother   . Heart attack Mother   . Hypertension Father     Medications: Prior to Admission medications   Medication Sig Start Date End Date Taking? Authorizing Provider  amLODipine (NORVASC) 5 MG tablet Take 5 mg by mouth daily before breakfast.    Yes [provider]  aspirin EC 81 MG tablet Take 81 mg by mouth daily before breakfast.    Yes [provider]  cholecalciferol (VITAMIN D) 1000 UNITS tablet Take 2,000 Units by mouth daily.    Yes  [provider]  COUMADIN 5 MG tablet Take 5-7.5 mg by mouth See admin instructions. 5 mg every Sunday and Wednesday and 7.5 mg on all other days 09/18/13  Yes [provider]  hydrochlorothiazide (MICROZIDE) 12.5 MG capsule Take 12.5 mg by mouth daily. 12/10/14  Yes [provider]  Iron TABS Take 1 tablet by mouth daily. Pt not sure of dosage    Yes [provider]  levothyroxine (SYNTHROID, LEVOTHROID) 75 MCG tablet Take 75 mcg by mouth daily before breakfast.  10/06/13  Yes [provider]  metoprolol succinate (TOPROL-XL) 100 MG 24 hr tablet Take 100 mg by mouth daily. 09/28/13  Yes [provider]  rosuvastatin (CRESTOR) 5 MG tablet Take 5 mg by mouth daily.   Yes [provider]  sulfamethoxazole-trimethoprim (BACTRIM) 400-80 MG tablet Take 1 tablet  by mouth 2 (two) times daily. 05/25/17   Tanna Furry, MD  tobramycin (TOBREX) 0.3 % ophthalmic solution Place 1 drop into the left eye 4 (four) times daily. 04/30/17   [provider]    Physical Exam: Patient Vitals for the past 24 hrs:  BP Temp Temp src Pulse Resp SpO2 Height Weight  05/25/17 2150 - - - - - - 5\' 10"  (1.778 m) 80.7 kg (178 lb)  05/25/17 2142 (!) 136/55 (!) 100.6 F (38.1 C) Oral 73 16 97 % - -  05/25/17 1902 (!) 175/66 (!) 101.3 F (38.5 C) Oral 79 18 94 % - -    1. General:  in No Acute distress  well  -appearing 2. Psychological: Alert and Oriented 3. Head/ENT:    Dry Mucous Membranes                          Head Non traumatic, neck supple                           Poor Dentition 4. SKIN:   decreased Skin turgor,  Skin clean Dry and intact no rash 5. Heart: Regular rate and rhythm systolic Murmur, no Rub or gallop 6. Lungs:   no wheezes or crackles   7. Abdomen: Soft, non-tender, Non distended  bowel sounds present 8. Lower extremities: no clubbing, cyanosis, or edema 9. Neurologically Grossly intact, moving all 4 extremities equally   10. MSK:  Normal range of motion   body mass index is 25.54 kg/m.  Labs on Admission:   Labs on Admission: I have personally reviewed following labs and imaging studies  CBC:  Recent Labs Lab 05/25/17 1957  WBC 20.3*  HGB 14.1  HCT 41.1  MCV 99.5  PLT 856   Basic Metabolic Panel:  Recent Labs Lab 05/25/17 1957  NA 128*  K 3.9  CL 96*  CO2 24  GLUCOSE 152*  BUN 16  CREATININE 1.18  CALCIUM 9.2   GFR: Estimated Creatinine Clearance: 49.8 mL/min (by C-G formula based on SCr of 1.18 mg/dL). Liver Function Tests:  Recent Labs Lab 05/25/17 1957  AST 82*  ALT 45  ALKPHOS 65  BILITOT 2.5*  PROT 8.1  ALBUMIN 4.1    Recent Labs Lab 05/25/17 1957  LIPASE 27   No results for input(s): AMMONIA in the last 168 hours. Coagulation Profile: No results for input(s): INR, PROTIME in the last 168 hours. Cardiac Enzymes: No results for input(s): CKTOTAL, CKMB, CKMBINDEX, TROPONINI in the last 168 hours. BNP (last 3 results) No results for input(s): PROBNP in the last 8760 hours. HbA1C: No results for input(s): HGBA1C in the last 72 hours. CBG: No results for input(s): GLUCAP in the last 168 hours. Lipid Profile: No results for input(s): CHOL, HDL, LDLCALC, TRIG, CHOLHDL, LDLDIRECT in the last 72 hours. Thyroid Function Tests: No results for input(s): TSH, T4TOTAL, FREET4, T3FREE, THYROIDAB in the last 72 hours. Anemia Panel: No results for input(s): VITAMINB12, FOLATE, FERRITIN, TIBC, IRON, RETICCTPCT in the last 72 hours. Urine analysis:    Component Value Date/Time   COLORURINE YELLOW 05/25/2017 2012   APPEARANCEUR HAZY (A) 05/25/2017 2012   LABSPEC 1.014 05/25/2017 2012   PHURINE 7.0 05/25/2017 2012   GLUCOSEU NEGATIVE 05/25/2017 2012   HGBUR NEGATIVE 05/25/2017 2012   [place] NEGATIVE 05/25/2017 2012   KETONESUR 5 (A) 05/25/2017 2012   PROTEINUR NEGATIVE 05/25/2017 2012   NITRITE  NEGATIVE 05/25/2017 2012   LEUKOCYTESUR LARGE (A) 05/25/2017 2012   Sepsis  Labs: @LABRCNTIP (procalcitonin:4,lacticidven:4) )No results found for this or any previous visit (from the past 240 hour(s)).    UA  evidence of UTI    No results found for: HGBA1C  Estimated Creatinine Clearance: 49.8 mL/min (by C-G formula based on SCr of 1.18 mg/dL).  BNP (last 3 results) No results for input(s): PROBNP in the last 8760 hours.   ECG REPORT Not ordered  [facility] Weights   05/25/17 2150  Weight: 80.7 kg (178 lb)     Cultures: No results found for: SDES, SPECREQUEST, CULT, REPTSTATUS   Radiological Exams on Admission: No results found.  Chart has been reviewed    Assessment/Plan   83 y.o. male with medical history significant of mechanical aortic valve replacement (2000), chronic atrial fibrillation, chronic anticoagulation therapy, and coronary artery disease, HTN Admitted for   Sepsis likely secondary to urinary tract infection    Present on Admission: . Sepsis ([place]) also likely his cost pain urinary initiate ceftriaxone IV obtain blood and urine culture. No prior culture results available . CAD (coronary artery disease) of artery bypass graft - continue aspirin and metoprolol with holding parameters . Essential hypertension- the meToprol withholding parameters monitor for any evidence of hypotension . Paroxysmal atrial flutter ([facility]) -           - CHA2DS2 vas score 4 : continue current anticoagulation with  Coumadin per pharmacy,          -  Rate control:  Currently controlled with Toprolol,  will continue the change to metoprolol holding parameters while sepsis is suspected         Hyperlipidemia continue statin History of hypothyroidism check TSH continue levothyroxine . Acute lower UTI - treat  with Rocephin, await results of urine culture given back pain and long-standing symptoms symptoms would obtain further imaging with CT of the abdomen and pelvis to rule out perinephric abscess, and/or obstruction . Hyponatremia - hold hydrochlorothiazide,  rehydrate, check urine electrolytes, check TSH Symptoms of incomplete emptying will treat for urinary tract infection and then will need to be reassessed a for is any evidence of BPH   Other plan as per orders.  DVT prophylaxis:  Coumadin  Code Status:   DNR/DNI per patient    Family Communication:   Family at  Bedside  plan of care was discussed with  Wife,   Disposition Plan:     To home once workup is complete and patient is stable     Consults called: none   Admission status:    inpatient      Level of care    tele           I have spent a total of  56 min on this admission   Amaya Blakeman 05/25/2017, 12:33 AM    [facility]  Pager [phone]   after 2 AM please page floor coverage PA If 7AM-7PM, please contact the day team taking care of the patient  [email or web address]  Password [email or web address]

## 2017-05-25 NOTE — ED Triage Notes (Addendum)
Patient c/o lower abdominal pain with urinary frequency and nausea worsening x1 month. Reports intermittent chills x2 weeks. Febrile in triage. Denies V/D. Ambulatory. Denies chest pain and SOB.

## 2017-05-25 NOTE — ED Provider Notes (Addendum)
[facility] DEPT Provider Note   CSN: [id number] Arrival date & time: 05/25/17  1836     History   Chief Complaint Chief Complaint  Patient presents with  . Urinary Frequency    HPI Jason Santana is a 83 y.o. male. Chief complaint is fever, burning with urination.  HPI: this is an 83 year old male. He states that he had urinary symptoms several weeks ago and was told by his doctor that he had a normal urinalysis. Is not undergone treatment. Previous UTI the spring that underwent 2 courses of antibiotics. He has burning. He does not have hesitancy. No difficulty with stream. Shakes and chills intermittently for the last few weeks. No episodes of dizziness lightheadedness syncope or presyncope. No GI complaints. No difficulty breathing.  Past Medical History:  Diagnosis Date  . Atrial fibrillation ([place]) 11/25/2013  . CAD (coronary artery disease) of artery bypass graft 11/25/2013   2000 with multiple bypasses   . Chronic anticoagulation 11/25/2013   Coumadin therapy for mechanical aortic valve. Postoperative atrial fibrillation.   . Essential hypertension 11/25/2013  . H/O mechanical aortic valve replacement 11/25/2013   St. Jude bowel prosthesis 2000   . Premature ventricular contractions 11/25/2013    Patient Active Problem List   Diagnosis Date Noted  . Muscle weakness 12/13/2014  . Paroxysmal atrial flutter ([place]) 11/25/2013  . Essential hypertension 11/25/2013  . H/O mechanical aortic valve replacement 11/25/2013  . CAD (coronary artery disease) of artery bypass graft 11/25/2013  . Chronic anticoagulation 11/25/2013  . Premature ventricular contractions 11/25/2013    History reviewed. No pertinent surgical history.     Home Medications    Prior to Admission medications   Medication Sig Start Date End Date Taking? Authorizing Provider  amLODipine (NORVASC) 5 MG tablet Take 5 mg by mouth daily.    [provider]  aspirin EC 81 MG tablet Take 81 mg by mouth  daily.    [provider]  cholecalciferol (VITAMIN D) 1000 UNITS tablet Take 2,000 Units by mouth daily.     [provider]  COUMADIN 5 MG tablet Take 5-7.5 mg by mouth See admin instructions. Pt takes 7.5mg  M,W,F - takes 5mg  all other days 09/18/13   [provider]  hydrochlorothiazide (MICROZIDE) 12.5 MG capsule Take 12.5 mg by mouth daily. 12/10/14   [provider]  Iron TABS Take by mouth daily. Pt not sure of dosage    [provider]  levothyroxine (SYNTHROID, LEVOTHROID) 75 MCG tablet Take 75 mcg by mouth daily. 10/06/13   [provider]  metoprolol succinate (TOPROL-XL) 100 MG 24 hr tablet Take 100 mg by mouth daily. 09/28/13   [provider]  psyllium (METAMUCIL) 58.6 % packet Take 1 packet by mouth daily.    [provider]  rosuvastatin (CRESTOR) 5 MG tablet Take 5 mg by mouth daily.    [provider]  sulfamethoxazole-trimethoprim (BACTRIM) 400-80 MG tablet Take 1 tablet by mouth 2 (two) times daily. 05/25/17   Tanna Furry, MD    Family History Family History  Problem Relation Age of Onset  . Hypertension Mother   . Heart attack Mother   . Hypertension Father     Social History Social History  Substance Use Topics  . Smoking status: Former Smoker    Types: Cigarettes  . Smokeless tobacco: Never Used     Comment: quit 57 years ago  . Alcohol use No     Allergies   Vicodin [hydrocodone-acetaminophen]; Zocor [simvastatin]; Benicar [  olmesartan]; Codeine; Lopid [gemfibrozil]; Monopril [fosinopril]; Phenergan [promethazine hcl]; and Prilosec [omeprazole]   Review of Systems Review of Systems  Constitutional: Positive for chills and fever. Negative for appetite change, diaphoresis and fatigue.  HENT: Negative for mouth sores, sore throat and trouble swallowing.   Eyes: Negative for visual disturbance.  Respiratory: Negative for cough, chest tightness, shortness of breath and wheezing.     Cardiovascular: Negative for chest pain.  Gastrointestinal: Negative for abdominal distention, abdominal pain, diarrhea, nausea and vomiting.  Endocrine: Negative for polydipsia, polyphagia and polyuria.  Genitourinary: Positive for dysuria. Negative for frequency and hematuria.  Musculoskeletal: Negative for gait problem.  Skin: Negative for color change, pallor and rash.  Neurological: Negative for dizziness, syncope, light-headedness and headaches.  Hematological: Does not bruise/bleed easily.  Psychiatric/Behavioral: Negative for behavioral problems and confusion.     Physical Exam Updated Vital Signs BP (!) 136/55 (BP Location: Left Arm)   Pulse 73   Temp (!) 100.6 F (38.1 C) (Oral)   Resp 16   Ht 5\' 10"  (1.778 m)   Wt 80.7 kg (178 lb)   SpO2 97%   BMI 25.54 kg/m   Physical Exam  Constitutional: He is oriented to person, place, and time. He appears well-developed and well-nourished. No distress.  HENT:  Head: Normocephalic.  Eyes: Pupils are equal, round, and reactive to light. Conjunctivae are normal. No scleral icterus.  Neck: Normal range of motion. Neck supple. No thyromegaly present.  Cardiovascular: Normal rate and regular rhythm.  Exam reveals no gallop and no friction rub.   No murmur heard. Pulmonary/Chest: Effort normal and breath sounds normal. No respiratory distress. He has no wheezes. He has no rales.  Abdominal: Soft. Bowel sounds are normal. He exhibits no distension. There is no tenderness. There is no rebound.  Musculoskeletal: Normal range of motion.  Neurological: He is alert and oriented to person, place, and time.  Skin: Skin is warm and dry. No rash noted.  Psychiatric: He has a normal mood and affect. His behavior is normal.     ED Treatments / Results  Labs (all labs ordered are listed, but only abnormal results are displayed) Labs Reviewed  COMPREHENSIVE METABOLIC PANEL - Abnormal; Notable for the following:       Result Value   Sodium  128 (*)    Chloride 96 (*)    Glucose, Bld 152 (*)    AST 82 (*)    Total Bilirubin 2.5 (*)    GFR calc non Af Amer 56 (*)    All other components within normal limits  CBC - Abnormal; Notable for the following:    WBC 20.3 (*)    RBC 4.13 (*)    MCH 34.1 (*)    All other components within normal limits  URINALYSIS, ROUTINE W REFLEX MICROSCOPIC - Abnormal; Notable for the following:    APPearance HAZY (*)    Ketones, ur 5 (*)    Leukocytes, UA LARGE (*)    Bacteria, UA MANY (*)    All other components within normal limits  URINE CULTURE  CULTURE, BLOOD (ROUTINE X 2)  CULTURE, BLOOD (ROUTINE X 2)  LIPASE, BLOOD  I-STAT CG4 LACTIC ACID, ED    EKG  EKG Interpretation None       Radiology No results found.  Procedures Procedures (including critical care time)  Medications Ordered in ED Medications  acetaminophen (TYLENOL) tablet 650 mg (650 mg Oral Given 05/25/17 1923)  cefTRIAXone (ROCEPHIN) 2 g in dextrose 5 %  50 mL IVPB (2 g Intravenous New Bag/Given 05/25/17 2201)     Initial Impression / Assessment and Plan / ED Course  I have reviewed the triage vital signs and the nursing notes.  Pertinent labs & imaging results that were available during my care of the patient were reviewed by me and considered in my medical decision making (see chart for details).     83 y/o male with urinary symptoms. Urine appears infected. Normal  lactate. Reassuring vitals. Leukocytosis of 20K. Is not had prostate symptoms. No urine retention. Given IV Rocephin. I discussed admission. Initially patient hesitant. We'll finish antibiotics as I reexamined him is having shakes and chills and rigors and states he feels "terrible". Not confused. Not hypotensive or tachycardic but markedly weak. We'll discussed with hospitalist regarding admission.   Final Clinical Impressions(s) / ED Diagnoses   Final diagnoses:  Lower urinary tract infectious disease    New Prescriptions New  Prescriptions   SULFAMETHOXAZOLE-TRIMETHOPRIM (BACTRIM) 400-80 MG TABLET    Take 1 tablet by mouth 2 (two) times daily.     Tanna Furry, MD 05/25/17 Candida Peeling    Tanna Furry, MD 05/25/17 [phone]

## 2017-05-25 NOTE — ED Notes (Signed)
Bed: WA09 Expected date:  Expected time:  Means of arrival:  Comments: triage

## 2017-05-26 ENCOUNTER — Encounter (HOSPITAL_COMMUNITY): Payer: Self-pay | Admitting: Radiology

## 2017-05-26 ENCOUNTER — Inpatient Hospital Stay (HOSPITAL_COMMUNITY): Payer: Medicare Other

## 2017-05-26 DIAGNOSIS — E871 Hypo-osmolality and hyponatremia: Secondary | ICD-10-CM | POA: Diagnosis not present

## 2017-05-26 DIAGNOSIS — A419 Sepsis, unspecified organism: Secondary | ICD-10-CM | POA: Diagnosis not present

## 2017-05-26 DIAGNOSIS — H3530 Unspecified macular degeneration: Secondary | ICD-10-CM | POA: Diagnosis present

## 2017-05-26 DIAGNOSIS — Z87440 Personal history of urinary (tract) infections: Secondary | ICD-10-CM | POA: Diagnosis not present

## 2017-05-26 DIAGNOSIS — E785 Hyperlipidemia, unspecified: Secondary | ICD-10-CM | POA: Diagnosis present

## 2017-05-26 DIAGNOSIS — Z952 Presence of prosthetic heart valve: Secondary | ICD-10-CM | POA: Diagnosis not present

## 2017-05-26 DIAGNOSIS — I4892 Unspecified atrial flutter: Secondary | ICD-10-CM | POA: Diagnosis present

## 2017-05-26 DIAGNOSIS — I10 Essential (primary) hypertension: Secondary | ICD-10-CM | POA: Diagnosis not present

## 2017-05-26 DIAGNOSIS — A4151 Sepsis due to Escherichia coli [E. coli]: Secondary | ICD-10-CM | POA: Diagnosis present

## 2017-05-26 DIAGNOSIS — Z23 Encounter for immunization: Secondary | ICD-10-CM | POA: Diagnosis not present

## 2017-05-26 DIAGNOSIS — K5730 Diverticulosis of large intestine without perforation or abscess without bleeding: Secondary | ICD-10-CM | POA: Diagnosis not present

## 2017-05-26 DIAGNOSIS — I482 Chronic atrial fibrillation: Secondary | ICD-10-CM | POA: Diagnosis present

## 2017-05-26 DIAGNOSIS — Z7982 Long term (current) use of aspirin: Secondary | ICD-10-CM | POA: Diagnosis not present

## 2017-05-26 DIAGNOSIS — Z87891 Personal history of nicotine dependence: Secondary | ICD-10-CM | POA: Diagnosis not present

## 2017-05-26 DIAGNOSIS — Z66 Do not resuscitate: Secondary | ICD-10-CM | POA: Diagnosis present

## 2017-05-26 DIAGNOSIS — Z8249 Family history of ischemic heart disease and other diseases of the circulatory system: Secondary | ICD-10-CM | POA: Diagnosis not present

## 2017-05-26 DIAGNOSIS — Z885 Allergy status to narcotic agent status: Secondary | ICD-10-CM | POA: Diagnosis not present

## 2017-05-26 DIAGNOSIS — E039 Hypothyroidism, unspecified: Secondary | ICD-10-CM | POA: Diagnosis present

## 2017-05-26 DIAGNOSIS — N390 Urinary tract infection, site not specified: Secondary | ICD-10-CM | POA: Diagnosis not present

## 2017-05-26 DIAGNOSIS — Z7901 Long term (current) use of anticoagulants: Secondary | ICD-10-CM | POA: Diagnosis not present

## 2017-05-26 DIAGNOSIS — N400 Enlarged prostate without lower urinary tract symptoms: Secondary | ICD-10-CM | POA: Diagnosis not present

## 2017-05-26 DIAGNOSIS — I25810 Atherosclerosis of coronary artery bypass graft(s) without angina pectoris: Secondary | ICD-10-CM | POA: Diagnosis present

## 2017-05-26 DIAGNOSIS — Z888 Allergy status to other drugs, medicaments and biological substances status: Secondary | ICD-10-CM | POA: Diagnosis not present

## 2017-05-26 LAB — BLOOD CULTURE ID PANEL (REFLEXED)
Acinetobacter baumannii: NOT DETECTED
CANDIDA ALBICANS: NOT DETECTED
CANDIDA KRUSEI: NOT DETECTED
CANDIDA PARAPSILOSIS: NOT DETECTED
CANDIDA TROPICALIS: NOT DETECTED
CARBAPENEM RESISTANCE: NOT DETECTED
Candida glabrata: NOT DETECTED
ENTEROBACTERIACEAE SPECIES: DETECTED — AB
ENTEROCOCCUS SPECIES: NOT DETECTED
Enterobacter cloacae complex: NOT DETECTED
Escherichia coli: DETECTED — AB
Haemophilus influenzae: NOT DETECTED
KLEBSIELLA OXYTOCA: NOT DETECTED
KLEBSIELLA PNEUMONIAE: NOT DETECTED
Listeria monocytogenes: NOT DETECTED
Methicillin resistance: NOT DETECTED
Neisseria meningitidis: NOT DETECTED
PROTEUS SPECIES: NOT DETECTED
PSEUDOMONAS AERUGINOSA: NOT DETECTED
STAPHYLOCOCCUS SPECIES: NOT DETECTED
STREPTOCOCCUS AGALACTIAE: NOT DETECTED
STREPTOCOCCUS PNEUMONIAE: NOT DETECTED
Serratia marcescens: NOT DETECTED
Staphylococcus aureus (BCID): NOT DETECTED
Streptococcus pyogenes: NOT DETECTED
Streptococcus species: NOT DETECTED
Vancomycin resistance: NOT DETECTED

## 2017-05-26 LAB — PROTIME-INR
INR: 2.78
INR: 2.84
Prothrombin Time: 29.2 seconds — ABNORMAL HIGH (ref 11.4–15.2)
Prothrombin Time: 29.6 seconds — ABNORMAL HIGH (ref 11.4–15.2)

## 2017-05-26 LAB — PHOSPHORUS: PHOSPHORUS: 2.5 mg/dL (ref 2.5–4.6)

## 2017-05-26 LAB — CBC
HEMATOCRIT: 38.3 % — AB (ref 39.0–52.0)
Hemoglobin: 13.1 g/dL (ref 13.0–17.0)
MCH: 34.3 pg — ABNORMAL HIGH (ref 26.0–34.0)
MCHC: 34.2 g/dL (ref 30.0–36.0)
MCV: 100.3 fL — ABNORMAL HIGH (ref 78.0–100.0)
PLATELETS: 168 10*3/uL (ref 150–400)
RBC: 3.82 MIL/uL — AB (ref 4.22–5.81)
RDW: 13.1 % (ref 11.5–15.5)
WBC: 19.6 10*3/uL — AB (ref 4.0–10.5)

## 2017-05-26 LAB — COMPREHENSIVE METABOLIC PANEL
ALT: 39 U/L (ref 17–63)
AST: 67 U/L — AB (ref 15–41)
Albumin: 3.4 g/dL — ABNORMAL LOW (ref 3.5–5.0)
Alkaline Phosphatase: 56 U/L (ref 38–126)
Anion gap: 6 (ref 5–15)
BUN: 14 mg/dL (ref 6–20)
CO2: 25 mmol/L (ref 22–32)
Calcium: 8.3 mg/dL — ABNORMAL LOW (ref 8.9–10.3)
Chloride: 101 mmol/L (ref 101–111)
Creatinine, Ser: 1.11 mg/dL (ref 0.61–1.24)
GFR, EST NON AFRICAN AMERICAN: 60 mL/min — AB (ref >60)
Glucose, Bld: 123 mg/dL — ABNORMAL HIGH (ref 65–99)
POTASSIUM: 3.9 mmol/L (ref 3.5–5.1)
Sodium: 132 mmol/L — ABNORMAL LOW (ref 135–145)
TOTAL PROTEIN: 7.1 g/dL (ref 6.5–8.1)
Total Bilirubin: 2.6 mg/dL — ABNORMAL HIGH (ref 0.3–1.2)

## 2017-05-26 LAB — PROCALCITONIN: PROCALCITONIN: 0.15 ng/mL

## 2017-05-26 LAB — TSH: TSH: 2.1 u[IU]/mL (ref 0.350–4.500)

## 2017-05-26 LAB — MAGNESIUM: MAGNESIUM: 1.7 mg/dL (ref 1.7–2.4)

## 2017-05-26 LAB — SODIUM, URINE, RANDOM: SODIUM UR: 85 mmol/L

## 2017-05-26 LAB — LACTIC ACID, PLASMA: LACTIC ACID, VENOUS: 1.6 mmol/L (ref 0.5–1.9)

## 2017-05-26 LAB — OSMOLALITY, URINE: Osmolality, Ur: 561 mOsm/kg (ref 300–900)

## 2017-05-26 LAB — CREATININE, URINE, RANDOM: Creatinine, Urine: 117.03 mg/dL

## 2017-05-26 MED ORDER — IOPAMIDOL (ISOVUE-300) INJECTION 61%
INTRAVENOUS | Status: AC
  Filled 2017-05-26: qty 100

## 2017-05-26 MED ORDER — LEVOTHYROXINE SODIUM 75 MCG PO TABS
75.0000 ug | ORAL_TABLET | Freq: Every day | ORAL | Status: DC
Start: 2017-05-26 — End: 2017-05-29
  Administered 2017-05-26 – 2017-05-29 (×4): 75 ug via ORAL
  Filled 2017-05-26 (×4): qty 1

## 2017-05-26 MED ORDER — DEXTROSE 5 % IV SOLN
1.0000 g | INTRAVENOUS | Status: DC
  Filled 2017-05-26: qty 10

## 2017-05-26 MED ORDER — WARFARIN SODIUM 4 MG PO TABS
4.0000 mg | ORAL_TABLET | Freq: Once | ORAL | Status: AC
  Administered 2017-05-26: 4 mg via ORAL
  Filled 2017-05-26: qty 1

## 2017-05-26 MED ORDER — METOPROLOL TARTRATE 50 MG PO TABS
50.0000 mg | ORAL_TABLET | Freq: Two times a day (BID) | ORAL | Status: DC
  Administered 2017-05-26 – 2017-05-29 (×7): 50 mg via ORAL
  Filled 2017-05-26 (×5): qty 1
  Filled 2017-05-26: qty 2
  Filled 2017-05-26: qty 1

## 2017-05-26 MED ORDER — IOPAMIDOL (ISOVUE-300) INJECTION 61%
100.0000 mL | Freq: Once | INTRAVENOUS | Status: AC | PRN
  Administered 2017-05-26: 100 mL via INTRAVENOUS

## 2017-05-26 MED ORDER — ACETAMINOPHEN 325 MG PO TABS
650.0000 mg | ORAL_TABLET | Freq: Four times a day (QID) | ORAL | Status: DC | PRN
  Administered 2017-05-26 – 2017-05-28 (×5): 650 mg via ORAL
  Filled 2017-05-26 (×5): qty 2

## 2017-05-26 MED ORDER — TOBRAMYCIN 0.3 % OP SOLN
1.0000 [drp] | Freq: Four times a day (QID) | OPHTHALMIC | Status: DC
  Administered 2017-05-26: 1 [drp] via OPHTHALMIC
  Filled 2017-05-26: qty 5

## 2017-05-26 MED ORDER — ASPIRIN EC 81 MG PO TBEC
81.0000 mg | DELAYED_RELEASE_TABLET | Freq: Every day | ORAL | Status: DC
  Administered 2017-05-26 – 2017-05-29 (×4): 81 mg via ORAL
  Filled 2017-05-26 (×4): qty 1

## 2017-05-26 MED ORDER — ACETAMINOPHEN 650 MG RE SUPP: 650.0000 mg | Freq: Four times a day (QID) | RECTAL | Status: DC | PRN

## 2017-05-26 MED ORDER — SODIUM CHLORIDE 0.9 % IV SOLN
INTRAVENOUS | Status: DC
  Administered 2017-05-26: 02:00:00 via INTRAVENOUS

## 2017-05-26 MED ORDER — SODIUM CHLORIDE 0.9 % IV SOLN
INTRAVENOUS | Status: DC
  Administered 2017-05-26: 23:00:00 via INTRAVENOUS

## 2017-05-26 MED ORDER — INFLUENZA VAC SPLIT HIGH-DOSE 0.5 ML IM SUSY
0.5000 mL | PREFILLED_SYRINGE | INTRAMUSCULAR | Status: AC
  Administered 2017-05-28: 0.5 mL via INTRAMUSCULAR
  Filled 2017-05-26: qty 0.5

## 2017-05-26 MED ORDER — SODIUM CHLORIDE 0.9% FLUSH
3.0000 mL | Freq: Two times a day (BID) | INTRAVENOUS | Status: DC
  Administered 2017-05-26 – 2017-05-29 (×6): 3 mL via INTRAVENOUS

## 2017-05-26 MED ORDER — CEFTRIAXONE SODIUM 2 G IJ SOLR
2.0000 g | INTRAMUSCULAR | Status: DC
  Administered 2017-05-26: 2 g via INTRAVENOUS
  Filled 2017-05-26: qty 2

## 2017-05-26 MED ORDER — WARFARIN - PHARMACIST DOSING INPATIENT: Freq: Every day | Status: DC

## 2017-05-26 MED ORDER — ONDANSETRON HCL 4 MG PO TABS: 4.0000 mg | ORAL_TABLET | Freq: Four times a day (QID) | ORAL | Status: DC | PRN

## 2017-05-26 MED ORDER — ROSUVASTATIN CALCIUM 5 MG PO TABS
5.0000 mg | ORAL_TABLET | Freq: Every day | ORAL | Status: DC
  Administered 2017-05-26 – 2017-05-28 (×3): 5 mg via ORAL
  Filled 2017-05-26 (×3): qty 1

## 2017-05-26 MED ORDER — ONDANSETRON HCL 4 MG/2ML IJ SOLN: 4.0000 mg | Freq: Four times a day (QID) | INTRAMUSCULAR | Status: DC | PRN

## 2017-05-26 MED ORDER — TOBRAMYCIN 0.3 % OP SOLN: 1.0000 [drp] | Freq: Four times a day (QID) | OPHTHALMIC | Status: DC

## 2017-05-26 NOTE — ED Notes (Signed)
Call report to [place] @ [phone].

## 2017-05-26 NOTE — Progress Notes (Signed)
PROGRESS NOTE    Jason Santana  QTM:[id number] DOB: July 10, 1932 DOA: 05/25/2017 PCP: [name], MD   Brief Narrative: Jason Santana is a 83 y.o. male with medical history significant of mechanical aortic valve replacement (2000), chronic atrial fibrillation, chronic anticoagulation therapy with warfarin, coronary artery disease with prior CAB, and HTN who presented to the emergency department with complaints of dysuria as well as some suprapubic abdominal pain. He has also been noted to have some fevers and chills over the last 4 weeks and has been treated in the outpatient setting with multiple antibiotics which have not worked well for him.  This morning, after admission last night the patient states that he is starting to feel much better overall. He denies any complaints or concerns otherwise and states that he is hungry and would like something to eat. Urine cultures are currently pending.   Assessment & Plan:   Active Problems:   Paroxysmal atrial flutter (HCC)   Essential hypertension   H/O mechanical aortic valve replacement   CAD (coronary artery disease) of artery bypass graft   Chronic anticoagulation   Acute lower UTI   Hyponatremia   Sepsis ([place])   Macular degeneration of left eye   Hypothyroidism   Sepsis likely secondary to UTI with failed outpatient treatment-improving -Continue IV Rocephin -Gentle IV fluid -Urine cultures pending  Mild hyponatremia -Continue to treat with IV fluid as this appears to be improving -Recheck a.m. Labs -Urine electrolytes pending -HCTZ withheld -TSH within normal limits  CAD with prior CABG -Continue aspirin and metoprolol  Prior mechanical aortic valve replacement; anticoagulation with Coumadin -Continue Coumadin with pharmacy to monitor -INR therapeutic  Paroxysmal atrial flutter-rate controlled -Continue Coumadin -Continue metoprolol for ongoing rate control   DVT prophylaxis: Coumadin Code Status: Full Family  Communication: Wife at bedside Disposition Plan: Home likely in am once urine cultures return and labs stable   Consultants:   None   Procedures: None   Antimicrobials: IV Rocephin started 9/22  Objective: Vitals:   05/26/17 0848 05/26/17 1021 05/26/17 1112 05/26/17 1243  BP: (!) 121/56 (!) 127/27 (!) 117/53 (!) 144/57  Pulse: 68 69 (!) 59 64  Resp: 16  16 18   Temp:    99.2 F (37.3 C)  TempSrc:      SpO2: 97%  99% 99%  Weight:    84 kg (185 lb 3 oz)  Height:    5\' 10"  (1.778 m)    Intake/Output Summary (Last 24 hours) at 05/26/17 1301 Last data filed at 05/26/17 0749  Gross per 24 hour  Intake               50 ml  Output              400 ml  Net             -350 ml   Filed Weights   05/25/17 2150 05/26/17 1243  Weight: 80.7 kg (178 lb) 84 kg (185 lb 3 oz)    Examination:  General exam: Appears calm and comfortable on 3L [place] Respiratory system: Clear to auscultation. Respiratory effort normal. Cardiovascular system: S1 & S2 heard, RRR. No JVD, murmurs, rubs, gallops or clicks. No pedal edema. Gastrointestinal system: Abdomen is nondistended, soft and nontender. No organomegaly or masses felt. Normal bowel sounds heard. Central nervous system: Alert and oriented. No focal neurological deficits. Extremities: Symmetric 5 x 5 power. Skin: No rashes, lesions or ulcers Psychiatry: Judgement and insight appear normal. Mood &  affect appropriate.     Data Reviewed: I have personally reviewed following labs and imaging studies  CBC:  Recent Labs Lab 05/25/17 1957 05/26/17 0500  WBC 20.3* 19.6*  HGB 14.1 13.1  HCT 41.1 38.3*  MCV 99.5 100.3*  PLT 197 193   Basic Metabolic Panel:  Recent Labs Lab 05/25/17 1957 05/26/17 0500  NA 128* 132*  K 3.9 3.9  CL 96* 101  CO2 24 25  GLUCOSE 152* 123*  BUN 16 14  CREATININE 1.18 1.11  CALCIUM 9.2 8.3*  MG  --  1.7  PHOS  --  2.5   GFR: Estimated Creatinine Clearance: 53 mL/min (by C-G formula based on SCr  of 1.11 mg/dL). Liver Function Tests:  Recent Labs Lab 05/25/17 1957 05/26/17 0500  AST 82* 67*  ALT 45 39  ALKPHOS 65 56  BILITOT 2.5* 2.6*  PROT 8.1 7.1  ALBUMIN 4.1 3.4*    Recent Labs Lab 05/25/17 1957  LIPASE 27   No results for input(s): AMMONIA in the last 168 hours. Coagulation Profile:  Recent Labs Lab 05/26/17 0001 05/26/17 0500  INR 2.78 2.84   Cardiac Enzymes: No results for input(s): CKTOTAL, CKMB, CKMBINDEX, TROPONINI in the last 168 hours. BNP (last 3 results) No results for input(s): PROBNP in the last 8760 hours. HbA1C: No results for input(s): HGBA1C in the last 72 hours. CBG: No results for input(s): GLUCAP in the last 168 hours. Lipid Profile: No results for input(s): CHOL, HDL, LDLCALC, TRIG, CHOLHDL, LDLDIRECT in the last 72 hours. Thyroid Function Tests:  Recent Labs  05/26/17 0500  TSH 2.100   Anemia Panel: No results for input(s): VITAMINB12, FOLATE, FERRITIN, TIBC, IRON, RETICCTPCT in the last 72 hours. Sepsis Labs:  Recent Labs Lab 05/25/17 1957 05/25/17 2156 05/26/17 0240  PROCALCITON 0.15  --   --   LATICACIDVEN  --  1.36 1.6    No results found for this or any previous visit (from the past 240 hour(s)).    Radiology Studies: Ct Abdomen Pelvis W Contrast  Result Date: 05/26/2017 CLINICAL DATA:  Lower abdominal pain with urinary frequency and nausea EXAM: CT ABDOMEN AND PELVIS WITH CONTRAST TECHNIQUE: Multidetector CT imaging of the abdomen and pelvis was performed using the standard protocol following bolus administration of intravenous contrast. CONTRAST:  170mL ISOVUE-300 IOPAMIDOL (ISOVUE-300) INJECTION 61% COMPARISON:  04/09/2005 FINDINGS: Lower chest: Moderate emphysema at the bilateral lung bases. Partial atelectasis in the left lower lobe. No pleural effusion. Post sternotomy changes and valvular prosthesis. Hepatobiliary: No focal liver abnormality is seen. Status post cholecystectomy. No biliary dilatation.  Pancreas: Unremarkable. No pancreatic ductal dilatation or surrounding inflammatory changes. Spleen: Normal in size without focal abnormality. Adrenals/Urinary Tract: Adrenal glands are within normal limits. Multiple subcentimeter hypodense renal lesions too small to further characterize, probably cysts. 1 cm cyst upper pole left kidney. No hydronephrosis. The urinary bladder is unremarkable Stomach/Bowel: Stomach is within normal limits. Appendix not well seen but no right lower quadrant inflammatory process. No evidence of bowel wall thickening, distention, or inflammatory changes. Sigmoid colon diverticular disease without acute inflammation Vascular/Lymphatic: Aortic atherosclerosis. Mildly prominent upper abdominal lymph node measuring 8 mm, posterior to the mid pancreas. No significantly enlarged pelvic lymph nodes. Reproductive: Enlarged prostate with mass effect on the bladder Other: Negative for free air or free fluid. Small fat in the inguinal canals. Musculoskeletal: No acute or significant osseous findings. IMPRESSION: 1. No CT evidence for acute intra-abdominal or pelvic abnormality. 2. Moderate emphysema at the lung bases  3. Enlarged prostate gland with mild mass effect on the bladder, recommend clinical correlation 4. Sigmoid colon diverticular disease without acute inflammation Electronically Signed   By: Donavan Foil M.D.   On: 05/26/2017 00:50        Scheduled Meds: . aspirin EC  81 mg Oral QAC breakfast  . levothyroxine  75 mcg Oral QAC breakfast  . metoprolol tartrate  50 mg Oral BID  . rosuvastatin  5 mg Oral Daily  . sodium chloride flush  3 mL Intravenous Q12H  . tobramycin  1 drop Left Eye QID   Continuous Infusions: . cefTRIAXone (ROCEPHIN)  IV       LOS: 0 days    Time spent: 30 minutes    Rayford Williamsen Darleen Crocker, MD [facility] Pager [phone]  If 7PM-7AM, please contact night-coverage [email or web address] Password [email or web address] 05/26/2017, 1:01 PM

## 2017-05-26 NOTE — Progress Notes (Signed)
Report call to Baxter Flattery, RN on 4th floor

## 2017-05-26 NOTE — Progress Notes (Signed)
ANTICOAGULATION CONSULT NOTE - Initial Consult  Pharmacy Consult for warfarin Indication: Mechanical aortic valve  Allergies  Allergen Reactions  . Vicodin [Hydrocodone-Acetaminophen]     Severe sensitivity  . Zocor [Simvastatin]     Short memory loss.  [name] [Olmesartan]     Abdominal cramping and increased stools/ irritable bowel  . Codeine Nausea And Vomiting  . Lopid [Gemfibrozil] Other (See Comments)    transaminitis  . Monopril [Fosinopril]     transaminitis  . Phenergan [Promethazine Hcl]     Severe somnolence.  . Prilosec [Omeprazole]     dyspepsia    Patient Measurements: Height: 5\' 10"  (177.8 cm) Weight: 185 lb 3 oz (84 kg) IBW/kg (Calculated) : 73  Vital Signs: Temp: 99.2 F (37.3 C) (09/23 1243) BP: 144/57 (09/23 1243) Pulse Rate: 64 (09/23 1243)  Labs:  Recent Labs  05/25/17 1957 05/26/17 0001 05/26/17 0500  HGB 14.1  --  13.1  HCT 41.1  --  38.3*  PLT 197  --  168  LABPROT  --  29.2* 29.6*  INR  --  2.78 2.84  CREATININE 1.18  --  1.11    Estimated Creatinine Clearance: 53 mL/min (by C-G formula based on SCr of 1.11 mg/dL).   Medical History: Past Medical History:  Diagnosis Date  . Atrial fibrillation ([place]) 11/25/2013  . CAD (coronary artery disease) of artery bypass graft 11/25/2013   2000 with multiple bypasses   . Chronic anticoagulation 11/25/2013   Coumadin therapy for mechanical aortic valve. Postoperative atrial fibrillation.   . Essential hypertension 11/25/2013  . H/O mechanical aortic valve replacement 11/25/2013   St. Jude bowel prosthesis 2000   . Premature ventricular contractions 11/25/2013    Medications:  Prescriptions Prior to Admission  Medication Sig Dispense Refill Last Dose  . amLODipine (NORVASC) 5 MG tablet Take 5 mg by mouth daily before breakfast.    05/25/2017 at Unknown time  . aspirin EC 81 MG tablet Take 81 mg by mouth daily before breakfast.    05/25/2017 at 0500  . cholecalciferol (VITAMIN D) 1000 UNITS  tablet Take 2,000 Units by mouth daily.    05/25/2017 at Unknown time  . COUMADIN 5 MG tablet Take 5-7.5 mg by mouth See admin instructions. 5 mg every Sunday and Wednesday and 7.5 mg on all other days   05/25/2017 at 1500  . hydrochlorothiazide (MICROZIDE) 12.5 MG capsule Take 12.5 mg by mouth daily.   05/25/2017 at Unknown time  . Iron TABS Take 1 tablet by mouth daily. Pt not sure of dosage    05/25/2017 at Unknown time  . levothyroxine (SYNTHROID, LEVOTHROID) 75 MCG tablet Take 75 mcg by mouth daily before breakfast.    05/25/2017 at Unknown time  . metoprolol succinate (TOPROL-XL) 100 MG 24 hr tablet Take 100 mg by mouth daily.   05/25/2017 at 0500  . rosuvastatin (CRESTOR) 5 MG tablet Take 5 mg by mouth daily.   05/24/2017 at Unknown time  . tobramycin (TOBREX) 0.3 % ophthalmic solution Place 1 drop into the left eye 4 (four) times daily.       Assessment: 83yo M admitted with urinary complaints and shaking chills. Rocephin started for UTI. Patient on chronic warfarin for St. Jude aortic valve. INR therapeutic on admission and had trended up when checked 5 hours later with AM labs. Last warfarin dose was on 9/22.    Goal of Therapy:  INR 2.5-3.5 Monitor platelets by anticoagulation protocol: Yes   Plan:   Give conservative  4mg  of warfarin today at 1800. Likely to be more sensitive with poor appetite, acute illness, and antibiotics.     Check PT/INR daily.  Romeo Rabon, PharmD, pager [phone]. 05/26/2017,1:42 PM.

## 2017-05-26 NOTE — Progress Notes (Signed)
Pharmacy Antibiotic Note  Jason Santana is a 83 y.o. male with urinary symptoms and shaking chills admitted on 05/25/2017 with UTI .  Pharmacy has been consulted for ceftriaxone dosing.  Plan:  Ceftriaxone 2 gm IV q24h.  Pharmacy will sign off at this time.  Please reconsult if a change in clinical status warrants re-evaluation of dosage.   Height: 5\' 10"  (177.8 cm) Weight: 185 lb 3 oz (84 kg) IBW/kg (Calculated) : 73  Temp (24hrs), Avg:100.4 F (38 C), Min:99.2 F (37.3 C), Max:101.3 F (38.5 C)   Recent Labs Lab 05/25/17 1957 05/25/17 2156 05/26/17 0240 05/26/17 0500  WBC 20.3*  --   --  19.6*  CREATININE 1.18  --   --  1.11  LATICACIDVEN  --  1.36 1.6  --     Estimated Creatinine Clearance: 53 mL/min (by C-G formula based on SCr of 1.11 mg/dL).    Allergies  Allergen Reactions  . Vicodin [Hydrocodone-Acetaminophen]     Severe sensitivity  . Zocor [Simvastatin]     Short memory loss.  [name] [Olmesartan]     Abdominal cramping and increased stools/ irritable bowel  . Codeine Nausea And Vomiting  . Lopid [Gemfibrozil] Other (See Comments)    transaminitis  . Monopril [Fosinopril]     transaminitis  . Phenergan [Promethazine Hcl]     Severe somnolence.  . Prilosec [Omeprazole]     dyspepsia    Antimicrobials this admission: 9/23 rocephin >>   Dose adjustments this admission:   Microbiology results: 9/22 BCx: 2/2 GNR 9/22 UCx:  sent  Thank you for allowing pharmacy to be a part of this patient's care.  Gretta Arab PharmD, BCPS Pager [phone] 05/26/2017 3:34 PM

## 2017-05-26 NOTE — Progress Notes (Signed)
PHARMACY - PHYSICIAN COMMUNICATION CRITICAL VALUE ALERT - BLOOD CULTURE IDENTIFICATION (BCID)  Results for orders placed or performed during the hospital encounter of 05/25/17  Blood Culture ID Panel (Reflexed) (Collected: 05/25/2017  9:50 PM)  Result Value Ref Range   Enterococcus species NOT DETECTED NOT DETECTED   Vancomycin resistance NOT DETECTED NOT DETECTED   Listeria monocytogenes NOT DETECTED NOT DETECTED   Staphylococcus species NOT DETECTED NOT DETECTED   Staphylococcus aureus NOT DETECTED NOT DETECTED   Methicillin resistance NOT DETECTED NOT DETECTED   Streptococcus species NOT DETECTED NOT DETECTED   Streptococcus agalactiae NOT DETECTED NOT DETECTED   Streptococcus pneumoniae NOT DETECTED NOT DETECTED   Streptococcus pyogenes NOT DETECTED NOT DETECTED   Acinetobacter baumannii NOT DETECTED NOT DETECTED   Enterobacteriaceae species DETECTED (A) NOT DETECTED   Enterobacter cloacae complex NOT DETECTED NOT DETECTED   Escherichia coli DETECTED (A) NOT DETECTED   Klebsiella oxytoca NOT DETECTED NOT DETECTED   Klebsiella pneumoniae NOT DETECTED NOT DETECTED   Proteus species NOT DETECTED NOT DETECTED   Serratia marcescens NOT DETECTED NOT DETECTED   Carbapenem resistance NOT DETECTED NOT DETECTED   Haemophilus influenzae NOT DETECTED NOT DETECTED   Neisseria meningitidis NOT DETECTED NOT DETECTED   Pseudomonas aeruginosa NOT DETECTED NOT DETECTED   Candida albicans NOT DETECTED NOT DETECTED   Candida glabrata NOT DETECTED NOT DETECTED   Candida krusei NOT DETECTED NOT DETECTED   Candida parapsilosis NOT DETECTED NOT DETECTED   Candida tropicalis NOT DETECTED NOT DETECTED    Name of physician (or Provider) Contacted: Dr Manuella Ghazi  Changes to prescribed antibiotics required: Continue Ceftriaxone 2g IV q24h  Gretta Arab PharmD, BCPS Pager [phone] 05/26/2017 3:58 PM

## 2017-05-26 NOTE — Progress Notes (Signed)
Pharmacy Antibiotic Note  Jason Santana is a 83 y.o. male with urinary symptoms and shaking chills admitted on 05/25/2017 with UTI .  Pharmacy has been consulted for rocephin and warfarin dosing.  Plan: Rocephin 2 gm x1 then 1 gm IV q24h- rx will sign off as no further adjustments needed Daily PT/INR No warfarin tonight.  INR=2.78. LD 9/22  Height: 5\' 10"  (177.8 cm) Weight: 178 lb (80.7 kg) IBW/kg (Calculated) : 73  Temp (24hrs), Avg:101 F (38.3 C), Min:100.6 F (38.1 C), Max:101.3 F (38.5 C)   Recent Labs Lab 05/25/17 1957 05/25/17 2156  WBC 20.3*  --   CREATININE 1.18  --   LATICACIDVEN  --  1.36    Estimated Creatinine Clearance: 49.8 mL/min (by C-G formula based on SCr of 1.18 mg/dL).    Allergies  Allergen Reactions  . Vicodin [Hydrocodone-Acetaminophen]     Severe sensitivity  . Zocor [Simvastatin]     Short memory loss.  [name] [Olmesartan]     Abdominal cramping and increased stools/ irritable bowel  . Codeine Nausea And Vomiting  . Lopid [Gemfibrozil] Other (See Comments)    transaminitis  . Monopril [Fosinopril]     transaminitis  . Phenergan [Promethazine Hcl]     Severe somnolence.  . Prilosec [Omeprazole]     dyspepsia    Antimicrobials this admission: 9/23 rocephin >>    >>   Dose adjustments this admission:   Microbiology results:  BCx:   UCx:    Sputum:    MRSA PCR:   Thank you for allowing pharmacy to be a part of this patient's care.  Dorrene German 05/26/2017 12:58 AM

## 2017-05-27 ENCOUNTER — Encounter (HOSPITAL_COMMUNITY): Payer: Self-pay

## 2017-05-27 LAB — BASIC METABOLIC PANEL
Anion gap: 7 (ref 5–15)
BUN: 15 mg/dL (ref 6–20)
CHLORIDE: 101 mmol/L (ref 101–111)
CO2: 26 mmol/L (ref 22–32)
CREATININE: 1.18 mg/dL (ref 0.61–1.24)
Calcium: 8.5 mg/dL — ABNORMAL LOW (ref 8.9–10.3)
GFR calc non Af Amer: 56 mL/min — ABNORMAL LOW (ref >60)
Glucose, Bld: 116 mg/dL — ABNORMAL HIGH (ref 65–99)
POTASSIUM: 4.2 mmol/L (ref 3.5–5.1)
Sodium: 134 mmol/L — ABNORMAL LOW (ref 135–145)

## 2017-05-27 LAB — CBC
HEMATOCRIT: 40.5 % (ref 39.0–52.0)
HEMOGLOBIN: 13.6 g/dL (ref 13.0–17.0)
MCH: 33.4 pg (ref 26.0–34.0)
MCHC: 33.6 g/dL (ref 30.0–36.0)
MCV: 99.5 fL (ref 78.0–100.0)
Platelets: 146 10*3/uL — ABNORMAL LOW (ref 150–400)
RBC: 4.07 MIL/uL — ABNORMAL LOW (ref 4.22–5.81)
RDW: 13 % (ref 11.5–15.5)
WBC: 9.1 10*3/uL (ref 4.0–10.5)

## 2017-05-27 LAB — PROTIME-INR
INR: 2.36
Prothrombin Time: 25.6 seconds — ABNORMAL HIGH (ref 11.4–15.2)

## 2017-05-27 MED ORDER — WARFARIN SODIUM 5 MG PO TABS
7.5000 mg | ORAL_TABLET | Freq: Once | ORAL | Status: AC
  Administered 2017-05-27: 7.5 mg via ORAL
  Filled 2017-05-27: qty 1

## 2017-05-27 MED ORDER — SODIUM CHLORIDE 0.9 % IV SOLN
INTRAVENOUS | Status: DC
  Administered 2017-05-27: 10:00:00 via INTRAVENOUS

## 2017-05-27 MED ORDER — PIPERACILLIN-TAZOBACTAM 3.375 G IVPB
3.3750 g | Freq: Three times a day (TID) | INTRAVENOUS | Status: DC
  Administered 2017-05-27 – 2017-05-28 (×3): 3.375 g via INTRAVENOUS
  Filled 2017-05-27 (×3): qty 50

## 2017-05-27 NOTE — Care Management Note (Signed)
Case Management Note  Patient Details  Name: Jason Santana MRN: [id number] Date of Birth: 1931/10/25  Subjective/Objective: 83 y/o m admitted w/PAF/UTI. From home.                   Action/Plan:d/c plan home.   Expected Discharge Date:                  Expected Discharge Plan:  Home/Self Care  In-House Referral:     Discharge planning Services  CM Consult  Post Acute Care Choice:    Choice offered to:     DME Arranged:    DME Agency:     HH Arranged:    HH Agency:     Status of Service:  In process, will continue to follow  If discussed at Long Length of Stay Meetings, dates discussed:    Additional Comments:  Dessa Phi, RN 05/27/2017, 9:29 AM

## 2017-05-27 NOTE — Progress Notes (Signed)
[place] for warfarin Indication: Mechanical aortic valve  Allergies  Allergen Reactions  . Vicodin [Hydrocodone-Acetaminophen]     Severe sensitivity  . Zocor [Simvastatin]     Short memory loss.  [name] [Olmesartan]     Abdominal cramping and increased stools/ irritable bowel  . Codeine Nausea And Vomiting  . Lopid [Gemfibrozil] Other (See Comments)    transaminitis  . Monopril [Fosinopril]     transaminitis  . Phenergan [Promethazine Hcl]     Severe somnolence.  . Prilosec [Omeprazole]     dyspepsia    Patient Measurements: Height: 5\' 10"  (177.8 cm) Weight: 185 lb 3 oz (84 kg) IBW/kg (Calculated) : 73  Vital Signs: Temp: 99.2 F (37.3 C) (09/24 0854) Temp Source: Oral (09/24 0854) BP: 140/67 (09/24 0355) Pulse Rate: 65 (09/24 0355)  Labs:  Recent Labs  05/25/17 1957 05/26/17 0001 05/26/17 0500 05/27/17 0604  HGB 14.1  --  13.1 13.6  HCT 41.1  --  38.3* 40.5  PLT 197  --  168 146*  LABPROT  --  29.2* 29.6* 25.6*  INR  --  2.78 2.84 2.36  CREATININE 1.18  --  1.11 1.18    Estimated Creatinine Clearance: 49.8 mL/min (by C-G formula based on SCr of 1.18 mg/dL).  Assessment: 83yo M admitted with urinary complaints and shaking chills. Rocephin started for UTI. Patient on chronic warfarin for St. Jude aortic valve. INR therapeutic on admission and had trended up when checked 5 hours later with AM labs. Last warfarin dose was on 9/22.   Home warfarin regimen: 7.5mg  daily with 5mg  on Su/Wed  Today, 05/27/2017  INR = 2.36 (below goal)  CBC: Hgb and pltc   Diet: Heart Healthy  Drug- Drug interactions: antibiotics may increase sensitvity to warfarin   Goal of Therapy:  INR 2.5-3.5 (higher INR goal due to PAF + Mechanical AVR) Monitor platelets by anticoagulation protocol: Yes   Plan:   Warfarin 7.5mg  PO x 1 as per home dose as INR slightly below goal    Monitor trend closely with acute illness  Check PT/INR  daily.  Doreene Eland, PharmD, BCPS.   Pager: [phone] 05/27/2017 9:55 AM

## 2017-05-27 NOTE — Progress Notes (Signed)
[facility] PROGRESS NOTE  TAI SKELLY KNL:[id number] DOB: 07-25-1932 DOA: 05/25/2017 PCP: [name], MD  Brief Narrative: Jason Penna Dossis a 83 y.o.malewith medical history significant of mechanical aortic valve replacement (2000), chronic atrial fibrillation, chronic anticoagulation therapy with warfarin, coronary artery disease with prior CAB, and HTN who presented to the emergency department with complaints of dysuria as well as some suprapubic abdominal pain. He has also been noted to have some fevers and chills over the last 4 weeks and has been treated in the outpatient setting with multiple antibiotics which have not worked well for him.   Assessment & Plan:   Active Problems:   Paroxysmal atrial flutter (HCC)   Essential hypertension   H/O mechanical aortic valve replacement   CAD (coronary artery disease) of artery bypass graft   Chronic anticoagulation   Acute lower UTI   Hyponatremia   Sepsis ([place])   Macular degeneration of left eye   Hypothyroidism   Sepsis likely secondary to UTI with failed outpatient treatment. Now bacteriemia.  -persistent febrile. Will change IV Rocephin to zosyn, repeat cultures.  -Gentle IV fluid  Mild hyponatremia -Continue to treat with IV fluid as this appears to be improving -HCTZ withheld -TSH within normal limits  CAD with prior CABG -Continue aspirin and metoprolol  Prior mechanical aortic valve replacement; anticoagulation with Coumadin -Continue Coumadin with pharmacy to monitor -INR therapeutic  Paroxysmal atrial flutter-rate controlled -Continue Coumadin -Continue metoprolol for ongoing rate control   DVT prophylaxis: Coumadin Code Status: Full Family Communication: Wife at bedside Disposition Plan: pend clinical improvement, blood cultures    Consultants:   None   Procedures: None   Antimicrobials: IV Rocephin started 9/22  Zosyn 9/24>>  HPI/Subjective: Alert, no distress. febrile  AM  Objective: Vitals:   05/26/17 2319 05/27/17 0355  BP:  140/67  Pulse:  65  Resp:  18  Temp: (!) 100.8 F (38.2 C) 98.9 F (37.2 C)  SpO2:  97%    Intake/Output Summary (Last 24 hours) at 05/27/17 0828 Last data filed at 05/27/17 0739  Gross per 24 hour  Intake            212.5 ml  Output              350 ml  Net           -137.5 ml   Filed Weights   05/25/17 2150 05/26/17 1243  Weight: 80.7 kg (178 lb) 84 kg (185 lb 3 oz)    Exam:   General:  No distress   Cardiovascular: s1,s2 rrr  Respiratory: CTA BL  Abdomen: soft, nt, nd   Musculoskeletal: no leg edema    Data Reviewed: Basic Metabolic Panel:  Recent Labs Lab 05/25/17 1957 05/26/17 0500 05/27/17 0604  NA 128* 132* 134*  K 3.9 3.9 4.2  CL 96* 101 101  CO2 24 25 26   GLUCOSE 152* 123* 116*  BUN 16 14 15   CREATININE 1.18 1.11 1.18  CALCIUM 9.2 8.3* 8.5*  MG  --  1.7  --   PHOS  --  2.5  --    Liver Function Tests:  Recent Labs Lab 05/25/17 1957 05/26/17 0500  AST 82* 67*  ALT 45 39  ALKPHOS 65 56  BILITOT 2.5* 2.6*  PROT 8.1 7.1  ALBUMIN 4.1 3.4*    Recent Labs Lab 05/25/17 1957  LIPASE 27   No results for input(s): AMMONIA in the last 168 hours. CBC:  Recent Labs Lab 05/25/17 1957  05/26/17 0500 05/27/17 0604  WBC 20.3* 19.6* 9.1  HGB 14.1 13.1 13.6  HCT 41.1 38.3* 40.5  MCV 99.5 100.3* 99.5  PLT 197 168 146*   Cardiac Enzymes: No results for input(s): CKTOTAL, CKMB, CKMBINDEX, TROPONINI in the last 168 hours. BNP (last 3 results) No results for input(s): BNP in the last 8760 hours.  ProBNP (last 3 results) No results for input(s): PROBNP in the last 8760 hours.  CBG: No results for input(s): GLUCAP in the last 168 hours.  Recent Results (from the past 240 hour(s))  Culture, blood (Routine X 2) w Reflex to ID Panel     Status: None (Preliminary result)   Collection Time: 05/25/17  9:50 PM  Result Value Ref Range Status   Specimen Description BLOOD BLOOD  RIGHT FOREARM  Final   Special Requests   Final    BOTTLES DRAWN AEROBIC AND ANAEROBIC Blood Culture adequate volume   Culture  Setup Time   Final    GRAM NEGATIVE RODS IN BOTH AEROBIC AND ANAEROBIC BOTTLES CRITICAL RESULT CALLED TO, READ BACK BY AND VERIFIED WITH: C SHADE,PHARMDM AT 1556 05/26/17 BY L BENFIELD Performed at [facility], [place] [address], [place], [place] [address]    Culture GRAM NEGATIVE RODS  Final   Report Status PENDING  Incomplete  Blood Culture ID Panel (Reflexed)     Status: Abnormal   Collection Time: 05/25/17  9:50 PM  Result Value Ref Range Status   Enterococcus species NOT DETECTED NOT DETECTED Final   Vancomycin resistance NOT DETECTED NOT DETECTED Final   Listeria monocytogenes NOT DETECTED NOT DETECTED Final   Staphylococcus species NOT DETECTED NOT DETECTED Final   Staphylococcus aureus NOT DETECTED NOT DETECTED Final   Methicillin resistance NOT DETECTED NOT DETECTED Final   Streptococcus species NOT DETECTED NOT DETECTED Final   Streptococcus agalactiae NOT DETECTED NOT DETECTED Final   Streptococcus pneumoniae NOT DETECTED NOT DETECTED Final   Streptococcus pyogenes NOT DETECTED NOT DETECTED Final   Acinetobacter baumannii NOT DETECTED NOT DETECTED Final   Enterobacteriaceae species DETECTED (A) NOT DETECTED Final    Comment: CRITICAL RESULT CALLED TO, READ BACK BY AND VERIFIED WITH: C SHADE,PHARMD AT 1556 05/26/17 BY L BENFIELD    Enterobacter cloacae complex NOT DETECTED NOT DETECTED Final   Escherichia coli DETECTED (A) NOT DETECTED Final    Comment: CRITICAL RESULT CALLED TO, READ BACK BY AND VERIFIED WITH: C SHADE,PHARMD AT 1556 05/26/17 BY L BENFIELD    Klebsiella oxytoca NOT DETECTED NOT DETECTED Final   Klebsiella pneumoniae NOT DETECTED NOT DETECTED Final   Proteus species NOT DETECTED NOT DETECTED Final   Serratia marcescens NOT DETECTED NOT DETECTED Final   Carbapenem resistance NOT DETECTED NOT DETECTED Final   Haemophilus  influenzae NOT DETECTED NOT DETECTED Final   Neisseria meningitidis NOT DETECTED NOT DETECTED Final   Pseudomonas aeruginosa NOT DETECTED NOT DETECTED Final   Candida albicans NOT DETECTED NOT DETECTED Final   Candida glabrata NOT DETECTED NOT DETECTED Final   Candida krusei NOT DETECTED NOT DETECTED Final   Candida parapsilosis NOT DETECTED NOT DETECTED Final   Candida tropicalis NOT DETECTED NOT DETECTED Final    Comment: Performed at [facility], [address] [place], [place] [address]  Culture, blood (Routine X 2) w Reflex to ID Panel     Status: None (Preliminary result)   Collection Time: 05/25/17 10:00 PM  Result Value Ref Range Status   Specimen Description BLOOD LEFT HAND  Final   Special Requests   Final    BOTTLES DRAWN AEROBIC AND ANAEROBIC Blood Culture adequate volume   Culture  Setup Time   Final    GRAM NEGATIVE RODS IN BOTH AEROBIC AND ANAEROBIC BOTTLES CRITICAL RESULT CALLED TO, READ BACK BY AND VERIFIED WITH: C SHADE,PHARMD AT 1556 05/26/17 BY L BENFIELD Performed at [facility], [place] [address], [place], [place] [address]    Culture GRAM NEGATIVE RODS  Final   Report Status PENDING  Incomplete     Studies: Ct Abdomen Pelvis W Contrast  Result Date: 05/26/2017 CLINICAL DATA:  Lower abdominal pain with urinary frequency and nausea EXAM: CT ABDOMEN AND PELVIS WITH CONTRAST TECHNIQUE: Multidetector CT imaging of the abdomen and pelvis was performed using the standard protocol following bolus administration of intravenous contrast. CONTRAST:  16mL ISOVUE-300 IOPAMIDOL (ISOVUE-300) INJECTION 61% COMPARISON:  04/09/2005 FINDINGS: Lower chest: Moderate emphysema at the bilateral lung bases. Partial atelectasis in the left lower lobe. No pleural effusion. Post sternotomy changes and valvular prosthesis. Hepatobiliary: No focal liver abnormality is seen. Status post cholecystectomy. No biliary dilatation. Pancreas: Unremarkable. No pancreatic ductal dilatation or  surrounding inflammatory changes. Spleen: Normal in size without focal abnormality. Adrenals/Urinary Tract: Adrenal glands are within normal limits. Multiple subcentimeter hypodense renal lesions too small to further characterize, probably cysts. 1 cm cyst upper pole left kidney. No hydronephrosis. The urinary bladder is unremarkable Stomach/Bowel: Stomach is within normal limits. Appendix not well seen but no right lower quadrant inflammatory process. No evidence of bowel wall thickening, distention, or inflammatory changes. Sigmoid colon diverticular disease without acute inflammation Vascular/Lymphatic: Aortic atherosclerosis. Mildly prominent upper abdominal lymph node measuring 8 mm, posterior to the mid pancreas. No significantly enlarged pelvic lymph nodes. Reproductive: Enlarged prostate with mass effect on the bladder Other: Negative for free air or free fluid. Small fat in the inguinal canals. Musculoskeletal: No acute or significant osseous findings. IMPRESSION: 1. No CT evidence for acute intra-abdominal or pelvic abnormality. 2. Moderate emphysema at the lung bases 3. Enlarged prostate gland with mild mass effect on the bladder, recommend clinical correlation 4. Sigmoid colon diverticular disease without acute inflammation Electronically Signed   By: Donavan Foil M.D.   On: 05/26/2017 00:50    Scheduled Meds: . aspirin EC  81 mg Oral QAC breakfast  . Influenza vac split quadrivalent PF  0.5 mL Intramuscular Tomorrow-1000  . levothyroxine  75 mcg Oral QAC breakfast  . metoprolol tartrate  50 mg Oral BID  . rosuvastatin  5 mg Oral Daily  . sodium chloride flush  3 mL Intravenous Q12H  . tobramycin  1 drop Left Eye QID  . Warfarin - Pharmacist Dosing Inpatient   Does not apply q1800   Continuous Infusions: . sodium chloride 50 mL/hr at 05/26/17 2245  . cefTRIAXone (ROCEPHIN)  IV Stopped (05/26/17 2130)    Active Problems:   Paroxysmal atrial flutter (HCC)   Essential hypertension    H/O mechanical aortic valve replacement   CAD (coronary artery disease) of artery bypass graft   Chronic anticoagulation   Acute lower UTI   Hyponatremia   Sepsis ([place])   Macular degeneration of left eye   Hypothyroidism    Time spent: >35 minutes     Kinnie Feil  [facility] Pager [phone]. If 7PM-7AM, please contact night-coverage at [email or web address], password [facility] 05/27/2017, 8:28 AM  LOS: 1 day

## 2017-05-28 ENCOUNTER — Encounter (HOSPITAL_COMMUNITY): Payer: Self-pay

## 2017-05-28 LAB — CULTURE, BLOOD (ROUTINE X 2)
SPECIAL REQUESTS: ADEQUATE
Special Requests: ADEQUATE

## 2017-05-28 LAB — PROTIME-INR
INR: 2.02
Prothrombin Time: 22.7 seconds — ABNORMAL HIGH (ref 11.4–15.2)

## 2017-05-28 MED ORDER — CEFTRIAXONE SODIUM 2 G IJ SOLR
2.0000 g | INTRAMUSCULAR | Status: DC
  Administered 2017-05-28 – 2017-05-29 (×2): 2 g via INTRAVENOUS
  Filled 2017-05-28 (×2): qty 2

## 2017-05-28 MED ORDER — WARFARIN SODIUM 6 MG PO TABS
9.0000 mg | ORAL_TABLET | Freq: Once | ORAL | Status: AC
  Administered 2017-05-28: 9 mg via ORAL
  Filled 2017-05-28: qty 1

## 2017-05-28 NOTE — Progress Notes (Signed)
[place] for warfarin Indication: Mechanical aortic valve  Allergies  Allergen Reactions  . Vicodin [Hydrocodone-Acetaminophen]     Severe sensitivity  . Zocor [Simvastatin]     Short memory loss.  [name] [Olmesartan]     Abdominal cramping and increased stools/ irritable bowel  . Codeine Nausea And Vomiting  . Lopid [Gemfibrozil] Other (See Comments)    transaminitis  . Monopril [Fosinopril]     transaminitis  . Phenergan [Promethazine Hcl]     Severe somnolence.  . Prilosec [Omeprazole]     dyspepsia    Patient Measurements: Height: 5\' 10"  (177.8 cm) Weight: 185 lb 3 oz (84 kg) IBW/kg (Calculated) : 73  Vital Signs: Temp: 99.2 F (37.3 C) (09/25 0210) Temp Source: Oral (09/25 0210) BP: 128/52 (09/24 2049) Pulse Rate: 63 (09/24 2049)  Labs:  Recent Labs  05/25/17 1957  05/26/17 0500 05/27/17 0604 05/28/17 0558  HGB 14.1  --  13.1 13.6  --   HCT 41.1  --  38.3* 40.5  --   PLT 197  --  168 146*  --   LABPROT  --   < > 29.6* 25.6* 22.7*  INR  --   < > 2.84 2.36 2.02  CREATININE 1.18  --  1.11 1.18  --   < > = values in this interval not displayed.  Estimated Creatinine Clearance: 49.8 mL/min (by C-G formula based on SCr of 1.18 mg/dL).  Assessment: 83yo M admitted with urinary complaints and shaking chills. Rocephin started for UTI. Patient on chronic warfarin for [employer] aortic valve. INR therapeutic on admission and had trended up when checked 5 hours later with AM labs. Last warfarin dose was on 9/22.   Home warfarin regimen: 7.5mg  daily with 5mg  on Su/Wed  Today, 05/28/2017  INR = 2.02 (below goal)  CBC: Hgb WNL and pltc slightly decreased on 9/24   Diet: Heart Healthy  Drug- Drug interactions: antibiotics may increase sensitvity to warfarin   Goal of Therapy:  INR 2.5-3.5 (higher INR goal due to PAF + Mechanical AVR) Monitor platelets by anticoagulation protocol: Yes   Plan:   Warfarin 9mg  PO x 1  (increase dose as INR below goal)  Monitor trend closely with acute illness  Consider bridge therapy as appropriate  Check PT/INR daily.  Doreene Eland, PharmD, BCPS.   Pager: [phone] 05/28/2017 7:31 AM

## 2017-05-28 NOTE — Progress Notes (Signed)
[facility] PROGRESS NOTE  Jason Santana CXK:[id number] DOB: 02/11/1932 DOA: 05/25/2017 PCP: Jason Huddle, MD  Brief Narrative: Jason Santana a 83 y.o.malewith medical history significant of mechanical aortic valve replacement (2000), chronic atrial fibrillation, chronic anticoagulation therapy with warfarin, coronary artery disease with prior CAB, and HTN who presented to the emergency department with complaints of dysuria as well as some suprapubic abdominal pain. He has also been noted to have some fevers and chills over the last 4 weeks and has been treated in the outpatient setting with multiple antibiotics which have not worked well for him.   Assessment & Plan:   Active Problems:   Paroxysmal atrial flutter (HCC)   Essential hypertension   H/O mechanical aortic valve replacement   CAD (coronary artery disease) of artery bypass graft   Chronic anticoagulation   Acute lower UTI   Hyponatremia   Sepsis ([place])   Macular degeneration of left eye   Hypothyroidism   Sepsis likely secondary to UTI with failed outpatient treatment. + bacteriemia.  -will cont IV Rocephin. blood cultures (9/22): E coli. Pend repeat blood cultures from 9/24. May transition to oral regimen if second cultures is neg in 24-48 hrs   Mild hyponatremia -Continue to treat with IV fluid as this appears to be improving -HCTZ withheld -TSH within normal limits  CAD with prior CABG -Continue aspirin and metoprolol  Prior mechanical aortic valve replacement; anticoagulation with Coumadin -Continue Coumadin with pharmacy to monitor -INR therapeutic  Paroxysmal atrial flutter-rate controlled -Continue Coumadin -Continue metoprolol for ongoing rate control  Dispo: pend repeat blood culture results. Possible home 24-48 hrs   DVT prophylaxis: Coumadin Code Status: Full Family Communication: Jason Santana at bedside Disposition Plan: pend clinical improvement, blood cultures    Consultants:    None   Procedures: None   Antimicrobials: IV Rocephin started 9/22>>  Zosyn 9/24  HPI/Subjective: Alert, no distress.fever resolved   Objective: Vitals:   05/28/17 0123 05/28/17 0210  BP:    Pulse:    Resp:    Temp: 100 F (37.8 C) 99.2 F (37.3 C)  SpO2:      Intake/Output Summary (Last 24 hours) at 05/28/17 0916 Last data filed at 05/28/17 0856  Gross per 24 hour  Intake          1409.17 ml  Output                0 ml  Net          1409.17 ml   Filed Weights   05/25/17 2150 05/26/17 1243  Weight: 80.7 kg (178 lb) 84 kg (185 lb 3 oz)    Exam:   General:  No distress   Cardiovascular: s1,s2 rrr  Respiratory: CTA BL  Abdomen: soft, nt, nd   Musculoskeletal: no leg edema    Data Reviewed: Basic Metabolic Panel:  Recent Labs Lab 05/25/17 1957 05/26/17 0500 05/27/17 0604  NA 128* 132* 134*  K 3.9 3.9 4.2  CL 96* 101 101  CO2 24 25 26   GLUCOSE 152* 123* 116*  BUN 16 14 15   CREATININE 1.18 1.11 1.18  CALCIUM 9.2 8.3* 8.5*  MG  --  1.7  --   PHOS  --  2.5  --    Liver Function Tests:  Recent Labs Lab 05/25/17 1957 05/26/17 0500  AST 82* 67*  ALT 45 39  ALKPHOS 65 56  BILITOT 2.5* 2.6*  PROT 8.1 7.1  ALBUMIN 4.1 3.4*    Recent Labs Lab  05/25/17 1957  LIPASE 27   No results for input(s): AMMONIA in the last 168 hours. CBC:  Recent Labs Lab 05/25/17 1957 05/26/17 0500 05/27/17 0604  WBC 20.3* 19.6* 9.1  HGB 14.1 13.1 13.6  HCT 41.1 38.3* 40.5  MCV 99.5 100.3* 99.5  PLT 197 168 146*   Cardiac Enzymes: No results for input(s): CKTOTAL, CKMB, CKMBINDEX, TROPONINI in the last 168 hours. BNP (last 3 results) No results for input(s): BNP in the last 8760 hours.  ProBNP (last 3 results) No results for input(s): PROBNP in the last 8760 hours.  CBG: No results for input(s): GLUCAP in the last 168 hours.  Recent Results (from the past 240 hour(s))  Urine Culture     Status: None (Preliminary result)   Collection  Time: 05/25/17  8:12 PM  Result Value Ref Range Status   Specimen Description URINE, CLEAN CATCH  Final   Special Requests NONE  Final   Culture   Final    CULTURE REINCUBATED FOR BETTER GROWTH Performed at [facility], [address] [place], [place] [address]    Report Status PENDING  Incomplete  Culture, blood (Routine X 2) w Reflex to ID Panel     Status: Abnormal   Collection Time: 05/25/17  9:50 PM  Result Value Ref Range Status   Specimen Description BLOOD BLOOD RIGHT FOREARM  Final   Special Requests   Final    BOTTLES DRAWN AEROBIC AND ANAEROBIC Blood Culture adequate volume   Culture  Setup Time   Final    GRAM NEGATIVE RODS IN BOTH AEROBIC AND ANAEROBIC BOTTLES CRITICAL RESULT CALLED TO, READ BACK BY AND VERIFIED WITH: C SHADE,PHARMDM AT 1556 05/26/17 BY L BENFIELD Performed at [facility], [place] [address], [place], [place] [address]    Culture ESCHERICHIA COLI (A)  Final   Report Status 05/28/2017 FINAL  Final   Organism ID, Bacteria ESCHERICHIA COLI  Final      Susceptibility   Escherichia coli - MIC*    AMPICILLIN >=32 RESISTANT Resistant     CEFAZOLIN <=4 SENSITIVE Sensitive     CEFEPIME <=1 SENSITIVE Sensitive     CEFTAZIDIME <=1 SENSITIVE Sensitive     CEFTRIAXONE <=1 SENSITIVE Sensitive     CIPROFLOXACIN >=4 RESISTANT Resistant     GENTAMICIN <=1 SENSITIVE Sensitive     IMIPENEM <=0.25 SENSITIVE Sensitive     TRIMETH/SULFA <=20 SENSITIVE Sensitive     AMPICILLIN/SULBACTAM 16 INTERMEDIATE Intermediate     PIP/TAZO <=4 SENSITIVE Sensitive     Extended ESBL NEGATIVE Sensitive     * ESCHERICHIA COLI  Blood Culture ID Panel (Reflexed)     Status: Abnormal   Collection Time: 05/25/17  9:50 PM  Result Value Ref Range Status   Enterococcus species NOT DETECTED NOT DETECTED Final   Vancomycin resistance NOT DETECTED NOT DETECTED Final   Listeria monocytogenes NOT DETECTED NOT DETECTED Final   Staphylococcus species NOT DETECTED NOT DETECTED Final    Staphylococcus aureus NOT DETECTED NOT DETECTED Final   Methicillin resistance NOT DETECTED NOT DETECTED Final   Streptococcus species NOT DETECTED NOT DETECTED Final   Streptococcus agalactiae NOT DETECTED NOT DETECTED Final   Streptococcus pneumoniae NOT DETECTED NOT DETECTED Final   Streptococcus pyogenes NOT DETECTED NOT DETECTED Final   Acinetobacter baumannii NOT DETECTED NOT DETECTED Final   Enterobacteriaceae species DETECTED (A) NOT DETECTED Final    Comment: CRITICAL RESULT CALLED TO, READ BACK BY AND VERIFIED WITH: C SHADE,PHARMD AT  1556 05/26/17 BY L BENFIELD    Enterobacter cloacae complex NOT DETECTED NOT DETECTED Final   Escherichia coli DETECTED (A) NOT DETECTED Final    Comment: CRITICAL RESULT CALLED TO, READ BACK BY AND VERIFIED WITH: C SHADE,PHARMD AT 1556 05/26/17 BY L BENFIELD    Klebsiella oxytoca NOT DETECTED NOT DETECTED Final   Klebsiella pneumoniae NOT DETECTED NOT DETECTED Final   Proteus species NOT DETECTED NOT DETECTED Final   Serratia marcescens NOT DETECTED NOT DETECTED Final   Carbapenem resistance NOT DETECTED NOT DETECTED Final   Haemophilus influenzae NOT DETECTED NOT DETECTED Final   Neisseria meningitidis NOT DETECTED NOT DETECTED Final   Pseudomonas aeruginosa NOT DETECTED NOT DETECTED Final   Candida albicans NOT DETECTED NOT DETECTED Final   Candida glabrata NOT DETECTED NOT DETECTED Final   Candida krusei NOT DETECTED NOT DETECTED Final   Candida parapsilosis NOT DETECTED NOT DETECTED Final   Candida tropicalis NOT DETECTED NOT DETECTED Final    Comment: Performed at [facility], [place], [place], [place] [address]  Culture, blood (Routine X 2) w Reflex to ID Panel     Status: Abnormal   Collection Time: 05/25/17 10:00 PM  Result Value Ref Range Status   Specimen Description BLOOD LEFT HAND  Final   Special Requests   Final    BOTTLES DRAWN AEROBIC AND ANAEROBIC Blood Culture adequate volume   Culture  Setup Time   Final     GRAM NEGATIVE RODS IN BOTH AEROBIC AND ANAEROBIC BOTTLES CRITICAL RESULT CALLED TO, READ BACK BY AND VERIFIED WITH: C SHADE,PHARMD AT 1556 05/26/17 BY L BENFIELD    Culture (A)  Final    ESCHERICHIA COLI SUSCEPTIBILITIES PERFORMED ON PREVIOUS CULTURE WITHIN THE LAST 5 DAYS. Performed at [facility], [place] [address] [place], [place] [address]    Report Status 05/28/2017 FINAL  Final     Studies: No results found.  Scheduled Meds: . aspirin EC  81 mg Oral QAC breakfast  . Influenza vac split quadrivalent PF  0.5 mL Intramuscular Tomorrow-1000  . levothyroxine  75 mcg Oral QAC breakfast  . metoprolol tartrate  50 mg Oral BID  . rosuvastatin  5 mg Oral Daily  . sodium chloride flush  3 mL Intravenous Q12H  . warfarin  9 mg Oral ONCE-1800  . Warfarin - Pharmacist Dosing Inpatient   Does not apply q1800   Continuous Infusions: . piperacillin-tazobactam (ZOSYN)  IV Stopped (05/28/17 0524)    Active Problems:   Paroxysmal atrial flutter (HCC)   Essential hypertension   H/O mechanical aortic valve replacement   CAD (coronary artery disease) of artery bypass graft   Chronic anticoagulation   Acute lower UTI   Hyponatremia   Sepsis ([place])   Macular degeneration of left eye   Hypothyroidism    Time spent: >35 minutes     Kinnie Feil  [facility] Pager [phone]. If 7PM-7AM, please contact night-coverage at [email or web address], password [facility] 05/28/2017, 9:16 AM  LOS: 2 days

## 2017-05-29 DIAGNOSIS — Z952 Presence of prosthetic heart valve: Secondary | ICD-10-CM

## 2017-05-29 DIAGNOSIS — E871 Hypo-osmolality and hyponatremia: Secondary | ICD-10-CM

## 2017-05-29 DIAGNOSIS — Z7901 Long term (current) use of anticoagulants: Secondary | ICD-10-CM

## 2017-05-29 DIAGNOSIS — I10 Essential (primary) hypertension: Secondary | ICD-10-CM

## 2017-05-29 LAB — URINE CULTURE: Culture: 80000 — AB

## 2017-05-29 LAB — PROTIME-INR
INR: 2.13
Prothrombin Time: 23.7 seconds — ABNORMAL HIGH (ref 11.4–15.2)

## 2017-05-29 MED ORDER — ONDANSETRON HCL 4 MG PO TABS: 4.0000 mg | ORAL_TABLET | Freq: Four times a day (QID) | ORAL | 0 refills | Status: DC | PRN

## 2017-05-29 MED ORDER — SACCHAROMYCES BOULARDII 250 MG PO CAPS: 250.0000 mg | ORAL_CAPSULE | Freq: Two times a day (BID) | ORAL | 0 refills | Status: DC

## 2017-05-29 MED ORDER — SACCHAROMYCES BOULARDII 250 MG PO CAPS
250.0000 mg | ORAL_CAPSULE | Freq: Two times a day (BID) | ORAL | Status: DC
  Administered 2017-05-29: 250 mg via ORAL
  Filled 2017-05-29: qty 1

## 2017-05-29 MED ORDER — CEFPODOXIME PROXETIL 200 MG PO TABS: 200.0000 mg | ORAL_TABLET | Freq: Two times a day (BID) | ORAL | 0 refills | Status: DC

## 2017-05-29 MED ORDER — METOPROLOL TARTRATE 50 MG PO TABS: 50.0000 mg | ORAL_TABLET | Freq: Two times a day (BID) | ORAL | 0 refills | Status: DC

## 2017-05-29 NOTE — Care Management Important Message (Signed)
Important Message  Patient Details  Name: Jason Santana MRN: [id number] Date of Birth: 10/22/1931   Medicare Important Message Given:  Yes    Kerin Salen 05/29/2017, 11:17 AMImportant Message  Patient Details  Name: Jason Santana MRN: [id number] Date of Birth: 05-01-1932   Medicare Important Message Given:  Yes    Kerin Salen 05/29/2017, 11:17 AM

## 2017-05-29 NOTE — Discharge Summary (Signed)
Physician Discharge Summary  Jason Santana:[id number] DOB: 1932-05-08 DOA: 05/25/2017  PCP: [name], MD  Admit date: 05/25/2017 Discharge date: 05/29/2017  Admitted From: Home Disposition:  Home  Recommendations for Outpatient Follow-up:  1. Follow up with PCP in 1-2 weeks    Discharge Condition:Improved CODE STATUS:DNR Diet recommendation: Regular   Brief/Interim Summary: 83 y.o.malewith medical history significant of mechanical aortic valve replacement (2000), chronic atrial fibrillation, chronic anticoagulation therapy with warfarin, coronary artery disease with prior CAB, and HTN who presented to the emergency department with complaints of dysuria as well as some suprapubic abdominal pain. He has also been noted to have some fevers and chills over the last 4 weeks and has been treated in the outpatient setting with multiple antibiotics which have not worked well for him  Sepsis likely secondary to UTI with failed outpatient treatment. + bacteriemia, present on admission.  -Patient initially started on  IV Rocephin. blood cultures (9/22): E coli. Pend repeat blood cultures from 9/24 negative x 2. -Will transition to vantin to complete total 2 weeks of treatment  Hyponatremia -Continue to treat with IV fluid as this appears to be improving -HCTZ withheld -TSH within normal limits  CAD with prior CABG -Continue aspirin and metoprolol  Prior mechanical aortic valve replacement;anticoagulation with Coumadin -Continue Coumadin with pharmacy to monitor -INR therapeutic  Paroxysmal atrial flutter-rate controlled -Continue Coumadin -Continue metoprolol for ongoing rate control -Of note metoprolol dose was decreased from 100mg  to 50mg  secondary to soft BP  Discharge Diagnoses:  Active Problems:   Paroxysmal atrial flutter (HCC)   Essential hypertension   H/O mechanical aortic valve replacement   CAD (coronary artery disease) of artery bypass graft   Chronic  anticoagulation   Acute lower UTI   Hyponatremia   Sepsis ([place])   Macular degeneration of left eye   Hypothyroidism    Discharge Instructions   Allergies as of 05/29/2017      Reactions   Vicodin [hydrocodone-acetaminophen]    Severe sensitivity   Zocor [simvastatin]    Short memory loss.   Benicar [olmesartan]    Abdominal cramping and increased stools/ irritable bowel   Codeine Nausea And Vomiting   Lopid [gemfibrozil] Other (See Comments)   transaminitis   Monopril [fosinopril]    transaminitis   Phenergan [promethazine Hcl]    Severe somnolence.   Prilosec [omeprazole]    dyspepsia      Medication List    STOP taking these medications   hydrochlorothiazide 12.5 MG capsule Commonly known as:  MICROZIDE   metoprolol succinate 100 MG 24 hr tablet Commonly known as:  TOPROL-XL Replaced by:  metoprolol tartrate 50 MG tablet     TAKE these medications   amLODipine 5 MG tablet Commonly known as:  NORVASC Take 5 mg by mouth daily before breakfast.   aspirin EC 81 MG tablet Take 81 mg by mouth daily before breakfast.   cefpodoxime 200 MG tablet Commonly known as:  VANTIN Take 1 tablet (200 mg total) by mouth 2 (two) times daily.   cholecalciferol 1000 units tablet Commonly known as:  VITAMIN D Take 2,000 Units by mouth daily.   COUMADIN 5 MG tablet Generic drug:  warfarin Take 5-7.5 mg by mouth See admin instructions. 5 mg every Sunday and Wednesday and 7.5 mg on all other days   Iron Tabs Take 1 tablet by mouth daily. Pt not sure of dosage   levothyroxine 75 MCG tablet Commonly known as:  SYNTHROID, LEVOTHROID Take 75 mcg by  mouth daily before breakfast.   metoprolol tartrate 50 MG tablet Commonly known as:  LOPRESSOR Take 1 tablet (50 mg total) by mouth 2 (two) times daily. Replaces:  metoprolol succinate 100 MG 24 hr tablet   ondansetron 4 MG tablet Commonly known as:  ZOFRAN Take 1 tablet (4 mg total) by mouth every 6 (six) hours as needed for  nausea.   rosuvastatin 5 MG tablet Commonly known as:  CRESTOR Take 5 mg by mouth daily.   saccharomyces boulardii 250 MG capsule Commonly known as:  FLORASTOR Take 1 capsule (250 mg total) by mouth 2 (two) times daily.   tobramycin 0.3 % ophthalmic solution Commonly known as:  TOBREX Place 1 drop into the left eye 4 (four) times daily.            Discharge Care Instructions        Start     Ordered   05/29/17 0000  saccharomyces boulardii (FLORASTOR) 250 MG capsule  2 times daily     05/29/17 1234   05/29/17 0000  ondansetron (ZOFRAN) 4 MG tablet  Every 6 hours PRN     05/29/17 1234   05/29/17 0000  cefpodoxime (VANTIN) 200 MG tablet  2 times daily     05/29/17 1234   05/29/17 0000  metoprolol tartrate (LOPRESSOR) 50 MG tablet  2 times daily     05/29/17 1235     Follow-up Information    [name], MD Follow up.   Specialty:  Internal Medicine Contact information: [address] [place]  [address] [phone]          Allergies  Allergen Reactions  . Vicodin [Hydrocodone-Acetaminophen]     Severe sensitivity  . Zocor [Simvastatin]     Short memory loss.  [name] [Olmesartan]     Abdominal cramping and increased stools/ irritable bowel  . Codeine Nausea And Vomiting  . Lopid [Gemfibrozil] Other (See Comments)    transaminitis  . Monopril [Fosinopril]     transaminitis  . Phenergan [Promethazine Hcl]     Severe somnolence.  . Prilosec [Omeprazole]     dyspepsia    Procedures/Studies: Ct Abdomen Pelvis W Contrast  Result Date: 05/26/2017 CLINICAL DATA:  Lower abdominal pain with urinary frequency and nausea EXAM: CT ABDOMEN AND PELVIS WITH CONTRAST TECHNIQUE: Multidetector CT imaging of the abdomen and pelvis was performed using the standard protocol following bolus administration of intravenous contrast. CONTRAST:  178mL ISOVUE-300 IOPAMIDOL (ISOVUE-300) INJECTION 61% COMPARISON:  04/09/2005 FINDINGS: Lower chest: Moderate  emphysema at the bilateral lung bases. Partial atelectasis in the left lower lobe. No pleural effusion. Post sternotomy changes and valvular prosthesis. Hepatobiliary: No focal liver abnormality is seen. Status post cholecystectomy. No biliary dilatation. Pancreas: Unremarkable. No pancreatic ductal dilatation or surrounding inflammatory changes. Spleen: Normal in size without focal abnormality. Adrenals/Urinary Tract: Adrenal glands are within normal limits. Multiple subcentimeter hypodense renal lesions too small to further characterize, probably cysts. 1 cm cyst upper pole left kidney. No hydronephrosis. The urinary bladder is unremarkable Stomach/Bowel: Stomach is within normal limits. Appendix not well seen but no right lower quadrant inflammatory process. No evidence of bowel wall thickening, distention, or inflammatory changes. Sigmoid colon diverticular disease without acute inflammation Vascular/Lymphatic: Aortic atherosclerosis. Mildly prominent upper abdominal lymph node measuring 8 mm, posterior to the mid pancreas. No significantly enlarged pelvic lymph nodes. Reproductive: Enlarged prostate with mass effect on the bladder Other: Negative for free air or free fluid. Small fat in the inguinal canals.  Musculoskeletal: No acute or significant osseous findings. IMPRESSION: 1. No CT evidence for acute intra-abdominal or pelvic abnormality. 2. Moderate emphysema at the lung bases 3. Enlarged prostate gland with mild mass effect on the bladder, recommend clinical correlation 4. Sigmoid colon diverticular disease without acute inflammation Electronically Signed   By: Donavan Foil M.D.   On: 05/26/2017 00:50    Subjective: Eager to go home  Discharge Exam: Vitals:   05/28/17 2102 05/29/17 0528  BP: (!) 119/50 123/89  Pulse: 63 68  Resp: 16 16  Temp: 100.3 F (37.9 C) 99.6 F (37.6 C)  SpO2: 95% 95%   Vitals:   05/28/17 1142 05/28/17 1310 05/28/17 2102 05/29/17 0528  BP:  (!) 156/67 (!) 119/50  123/89  Pulse: 67 65 63 68  Resp:  18 16 16   Temp:  98.9 F (37.2 C) 100.3 F (37.9 C) 99.6 F (37.6 C)  TempSrc:  Oral Oral Oral  SpO2:  98% 95% 95%  Weight:      Height:        General: Pt is alert, awake, not in acute distress Cardiovascular: RRR, S1/S2 +, no rubs, no gallops Respiratory: CTA bilaterally, no wheezing, no rhonchi Abdominal: Soft, NT, ND, bowel sounds + Extremities: no edema, no cyanosis   The results of significant diagnostics from this hospitalization (including imaging, microbiology, ancillary and laboratory) are listed below for reference.     Microbiology: Recent Results (from the past 240 hour(s))  Urine Culture     Status: Abnormal   Collection Time: 05/25/17  8:12 PM  Result Value Ref Range Status   Specimen Description URINE, CLEAN CATCH  Final   Special Requests NONE  Final   Culture 80,000 COLONIES/mL ESCHERICHIA COLI (A)  Final   Report Status 05/29/2017 FINAL  Final  Culture, blood (Routine X 2) w Reflex to ID Panel     Status: Abnormal   Collection Time: 05/25/17  9:50 PM  Result Value Ref Range Status   Specimen Description BLOOD BLOOD RIGHT FOREARM  Final   Special Requests   Final    BOTTLES DRAWN AEROBIC AND ANAEROBIC Blood Culture adequate volume   Culture  Setup Time   Final    GRAM NEGATIVE RODS IN BOTH AEROBIC AND ANAEROBIC BOTTLES CRITICAL RESULT CALLED TO, READ BACK BY AND VERIFIED WITH: C SHADE,PHARMDM AT 1556 05/26/17 BY L BENFIELD Performed at [facility], [place] [address], [place], [place] [address]    Culture ESCHERICHIA COLI (A)  Final   Report Status 05/28/2017 FINAL  Final   Organism ID, Bacteria ESCHERICHIA COLI  Final      Susceptibility   Escherichia coli - MIC*    AMPICILLIN >=32 RESISTANT Resistant     CEFAZOLIN <=4 SENSITIVE Sensitive     CEFEPIME <=1 SENSITIVE Sensitive     CEFTAZIDIME <=1 SENSITIVE Sensitive     CEFTRIAXONE <=1 SENSITIVE Sensitive     CIPROFLOXACIN >=4 RESISTANT Resistant      GENTAMICIN <=1 SENSITIVE Sensitive     IMIPENEM <=0.25 SENSITIVE Sensitive     TRIMETH/SULFA <=20 SENSITIVE Sensitive     AMPICILLIN/SULBACTAM 16 INTERMEDIATE Intermediate     PIP/TAZO <=4 SENSITIVE Sensitive     Extended ESBL NEGATIVE Sensitive     * ESCHERICHIA COLI  Blood Culture ID Panel (Reflexed)     Status: Abnormal   Collection Time: 05/25/17  9:50 PM  Result Value Ref Range Status   Enterococcus species NOT DETECTED NOT DETECTED Final   Vancomycin resistance  NOT DETECTED NOT DETECTED Final   Listeria monocytogenes NOT DETECTED NOT DETECTED Final   Staphylococcus species NOT DETECTED NOT DETECTED Final   Staphylococcus aureus NOT DETECTED NOT DETECTED Final   Methicillin resistance NOT DETECTED NOT DETECTED Final   Streptococcus species NOT DETECTED NOT DETECTED Final   Streptococcus agalactiae NOT DETECTED NOT DETECTED Final   Streptococcus pneumoniae NOT DETECTED NOT DETECTED Final   Streptococcus pyogenes NOT DETECTED NOT DETECTED Final   Acinetobacter baumannii NOT DETECTED NOT DETECTED Final   Enterobacteriaceae species DETECTED (A) NOT DETECTED Final    Comment: CRITICAL RESULT CALLED TO, READ BACK BY AND VERIFIED WITH: C SHADE,PHARMD AT 1556 05/26/17 BY L BENFIELD    Enterobacter cloacae complex NOT DETECTED NOT DETECTED Final   Escherichia coli DETECTED (A) NOT DETECTED Final    Comment: CRITICAL RESULT CALLED TO, READ BACK BY AND VERIFIED WITH: C SHADE,PHARMD AT 1556 05/26/17 BY L BENFIELD    Klebsiella oxytoca NOT DETECTED NOT DETECTED Final   Klebsiella pneumoniae NOT DETECTED NOT DETECTED Final   Proteus species NOT DETECTED NOT DETECTED Final   Serratia marcescens NOT DETECTED NOT DETECTED Final   Carbapenem resistance NOT DETECTED NOT DETECTED Final   Haemophilus influenzae NOT DETECTED NOT DETECTED Final   Neisseria meningitidis NOT DETECTED NOT DETECTED Final   Pseudomonas aeruginosa NOT DETECTED NOT DETECTED Final   Candida albicans NOT DETECTED NOT  DETECTED Final   Candida glabrata NOT DETECTED NOT DETECTED Final   Candida krusei NOT DETECTED NOT DETECTED Final   Candida parapsilosis NOT DETECTED NOT DETECTED Final   Candida tropicalis NOT DETECTED NOT DETECTED Final    Comment: Performed at [facility], [address], [place], [place] [address]  Culture, blood (Routine X 2) w Reflex to ID Panel     Status: Abnormal   Collection Time: 05/25/17 10:00 PM  Result Value Ref Range Status   Specimen Description BLOOD LEFT HAND  Final   Special Requests   Final    BOTTLES DRAWN AEROBIC AND ANAEROBIC Blood Culture adequate volume   Culture  Setup Time   Final    GRAM NEGATIVE RODS IN BOTH AEROBIC AND ANAEROBIC BOTTLES CRITICAL RESULT CALLED TO, READ BACK BY AND VERIFIED WITH: C SHADE,PHARMD AT 1556 05/26/17 BY L BENFIELD    Culture (A)  Final    ESCHERICHIA COLI SUSCEPTIBILITIES PERFORMED ON PREVIOUS CULTURE WITHIN THE LAST 5 DAYS. Performed at [facility], [place] [address], [place], [place] [address]    Report Status 05/28/2017 FINAL  Final  Culture, blood (routine x 2)     Status: None (Preliminary result)   Collection Time: 05/27/17  9:32 AM  Result Value Ref Range Status   Specimen Description BLOOD LEFT ANTECUBITAL  Final   Special Requests   Final    BOTTLES DRAWN AEROBIC AND ANAEROBIC Blood Culture adequate volume   Culture   Final    NO GROWTH 1 DAY Performed at [facility], [place] [address], [place], [place] [address]    Report Status PENDING  Incomplete  Culture, blood (routine x 2)     Status: None (Preliminary result)   Collection Time: 05/27/17  9:37 AM  Result Value Ref Range Status   Specimen Description BLOOD LEFT ARM  Final   Special Requests   Final    BOTTLES DRAWN AEROBIC AND ANAEROBIC Blood Culture adequate volume   Culture   Final    NO GROWTH 1 DAY Performed at [facility]  Lab, [address], [place], [place] [address]    Report Status PENDING  Incomplete     Labs: BNP (last 3  results) No results for input(s): BNP in the last 8760 hours. Basic Metabolic Panel:  Recent Labs Lab 05/25/17 1957 05/26/17 0500 05/27/17 0604  NA 128* 132* 134*  K 3.9 3.9 4.2  CL 96* 101 101  CO2 24 25 26   GLUCOSE 152* 123* 116*  BUN 16 14 15   CREATININE 1.18 1.11 1.18  CALCIUM 9.2 8.3* 8.5*  MG  --  1.7  --   PHOS  --  2.5  --    Liver Function Tests:  Recent Labs Lab 05/25/17 1957 05/26/17 0500  AST 82* 67*  ALT 45 39  ALKPHOS 65 56  BILITOT 2.5* 2.6*  PROT 8.1 7.1  ALBUMIN 4.1 3.4*    Recent Labs Lab 05/25/17 1957  LIPASE 27   No results for input(s): AMMONIA in the last 168 hours. CBC:  Recent Labs Lab 05/25/17 1957 05/26/17 0500 05/27/17 0604  WBC 20.3* 19.6* 9.1  HGB 14.1 13.1 13.6  HCT 41.1 38.3* 40.5  MCV 99.5 100.3* 99.5  PLT 197 168 146*   Cardiac Enzymes: No results for input(s): CKTOTAL, CKMB, CKMBINDEX, TROPONINI in the last 168 hours. BNP: Invalid input(s): POCBNP CBG: No results for input(s): GLUCAP in the last 168 hours. D-Dimer No results for input(s): DDIMER in the last 72 hours. Hgb A1c No results for input(s): HGBA1C in the last 72 hours. Lipid Profile No results for input(s): CHOL, HDL, LDLCALC, TRIG, CHOLHDL, LDLDIRECT in the last 72 hours. Thyroid function studies No results for input(s): TSH, T4TOTAL, T3FREE, THYROIDAB in the last 72 hours.  Invalid input(s): FREET3 Anemia work up No results for input(s): VITAMINB12, FOLATE, FERRITIN, TIBC, IRON, RETICCTPCT in the last 72 hours. Urinalysis    Component Value Date/Time   COLORURINE YELLOW 05/25/2017 2012   APPEARANCEUR HAZY (A) 05/25/2017 2012   LABSPEC 1.014 05/25/2017 2012   PHURINE 7.0 05/25/2017 2012   GLUCOSEU NEGATIVE 05/25/2017 2012   HGBUR NEGATIVE 05/25/2017 2012   BILIRUBINUR NEGATIVE 05/25/2017 2012   KETONESUR 5 (A) 05/25/2017 2012   PROTEINUR NEGATIVE 05/25/2017 2012   NITRITE NEGATIVE 05/25/2017 2012   LEUKOCYTESUR LARGE (A) 05/25/2017 2012    Sepsis Labs Invalid input(s): PROCALCITONIN,  WBC,  LACTICIDVEN Microbiology Recent Results (from the past 240 hour(s))  Urine Culture     Status: Abnormal   Collection Time: 05/25/17  8:12 PM  Result Value Ref Range Status   Specimen Description URINE, CLEAN CATCH  Final   Special Requests NONE  Final   Culture 80,000 COLONIES/mL ESCHERICHIA COLI (A)  Final   Report Status 05/29/2017 FINAL  Final  Culture, blood (Routine X 2) w Reflex to ID Panel     Status: Abnormal   Collection Time: 05/25/17  9:50 PM  Result Value Ref Range Status   Specimen Description BLOOD BLOOD RIGHT FOREARM  Final   Special Requests   Final    BOTTLES DRAWN AEROBIC AND ANAEROBIC Blood Culture adequate volume   Culture  Setup Time   Final    GRAM NEGATIVE RODS IN BOTH AEROBIC AND ANAEROBIC BOTTLES CRITICAL RESULT CALLED TO, READ BACK BY AND VERIFIED WITH: C SHADE,PHARMDM AT 1556 05/26/17 BY L BENFIELD Performed at [facility], [place] [address] [place], [place] [address]    Culture ESCHERICHIA COLI (A)  Final   Report Status 05/28/2017 FINAL  Final   Organism ID, Bacteria  ESCHERICHIA COLI  Final      Susceptibility   Escherichia coli - MIC*    AMPICILLIN >=32 RESISTANT Resistant     CEFAZOLIN <=4 SENSITIVE Sensitive     CEFEPIME <=1 SENSITIVE Sensitive     CEFTAZIDIME <=1 SENSITIVE Sensitive     CEFTRIAXONE <=1 SENSITIVE Sensitive     CIPROFLOXACIN >=4 RESISTANT Resistant     GENTAMICIN <=1 SENSITIVE Sensitive     IMIPENEM <=0.25 SENSITIVE Sensitive     TRIMETH/SULFA <=20 SENSITIVE Sensitive     AMPICILLIN/SULBACTAM 16 INTERMEDIATE Intermediate     PIP/TAZO <=4 SENSITIVE Sensitive     Extended ESBL NEGATIVE Sensitive     * ESCHERICHIA COLI  Blood Culture ID Panel (Reflexed)     Status: Abnormal   Collection Time: 05/25/17  9:50 PM  Result Value Ref Range Status   Enterococcus species NOT DETECTED NOT DETECTED Final   Vancomycin resistance NOT DETECTED NOT DETECTED Final   Listeria  monocytogenes NOT DETECTED NOT DETECTED Final   Staphylococcus species NOT DETECTED NOT DETECTED Final   Staphylococcus aureus NOT DETECTED NOT DETECTED Final   Methicillin resistance NOT DETECTED NOT DETECTED Final   Streptococcus species NOT DETECTED NOT DETECTED Final   Streptococcus agalactiae NOT DETECTED NOT DETECTED Final   Streptococcus pneumoniae NOT DETECTED NOT DETECTED Final   Streptococcus pyogenes NOT DETECTED NOT DETECTED Final   Acinetobacter baumannii NOT DETECTED NOT DETECTED Final   Enterobacteriaceae species DETECTED (A) NOT DETECTED Final    Comment: CRITICAL RESULT CALLED TO, READ BACK BY AND VERIFIED WITH: C SHADE,PHARMD AT 1556 05/26/17 BY L BENFIELD    Enterobacter cloacae complex NOT DETECTED NOT DETECTED Final   Escherichia coli DETECTED (A) NOT DETECTED Final    Comment: CRITICAL RESULT CALLED TO, READ BACK BY AND VERIFIED WITH: C SHADE,PHARMD AT 1556 05/26/17 BY L BENFIELD    Klebsiella oxytoca NOT DETECTED NOT DETECTED Final   Klebsiella pneumoniae NOT DETECTED NOT DETECTED Final   Proteus species NOT DETECTED NOT DETECTED Final   Serratia marcescens NOT DETECTED NOT DETECTED Final   Carbapenem resistance NOT DETECTED NOT DETECTED Final   Haemophilus influenzae NOT DETECTED NOT DETECTED Final   Neisseria meningitidis NOT DETECTED NOT DETECTED Final   Pseudomonas aeruginosa NOT DETECTED NOT DETECTED Final   Candida albicans NOT DETECTED NOT DETECTED Final   Candida glabrata NOT DETECTED NOT DETECTED Final   Candida krusei NOT DETECTED NOT DETECTED Final   Candida parapsilosis NOT DETECTED NOT DETECTED Final   Candida tropicalis NOT DETECTED NOT DETECTED Final    Comment: Performed at [facility], [address], [place], [place] [address]  Culture, blood (Routine X 2) w Reflex to ID Panel     Status: Abnormal   Collection Time: 05/25/17 10:00 PM  Result Value Ref Range Status   Specimen Description BLOOD LEFT HAND  Final   Special Requests   Final     BOTTLES DRAWN AEROBIC AND ANAEROBIC Blood Culture adequate volume   Culture  Setup Time   Final    GRAM NEGATIVE RODS IN BOTH AEROBIC AND ANAEROBIC BOTTLES CRITICAL RESULT CALLED TO, READ BACK BY AND VERIFIED WITH: C SHADE,PHARMD AT 1556 05/26/17 BY L BENFIELD    Culture (A)  Final    ESCHERICHIA COLI SUSCEPTIBILITIES PERFORMED ON PREVIOUS CULTURE WITHIN THE LAST 5 DAYS. Performed at [facility], [place] [address], [place], [place] [address]    Report Status 05/28/2017 FINAL  Final  Culture, blood (routine x 2)  Status: None (Preliminary result)   Collection Time: 05/27/17  9:32 AM  Result Value Ref Range Status   Specimen Description BLOOD LEFT ANTECUBITAL  Final   Special Requests   Final    BOTTLES DRAWN AEROBIC AND ANAEROBIC Blood Culture adequate volume   Culture   Final    NO GROWTH 1 DAY Performed at [facility], [address], [place], [place] [address]    Report Status PENDING  Incomplete  Culture, blood (routine x 2)     Status: None (Preliminary result)   Collection Time: 05/27/17  9:37 AM  Result Value Ref Range Status   Specimen Description BLOOD LEFT ARM  Final   Special Requests   Final    BOTTLES DRAWN AEROBIC AND ANAEROBIC Blood Culture adequate volume   Culture   Final    NO GROWTH 1 DAY Performed at [facility], [place] [address] [place], [place] [address]    Report Status PENDING  Incomplete     SIGNED:   Brandis Wixted, Orpah Melter, MD  [facility] 05/29/2017, 12:52 PM  If 7PM-7AM, please contact night-coverage [email or web address] Password [email or web address]

## 2017-05-29 NOTE — Care Management Note (Signed)
Case Management Note  Patient Details  Name: Jason Santana MRN: [id number] Date of Birth: 11-Feb-1932  Subjective/Objective:   No CM needs.                 Action/Plan:d/c home.   Expected Discharge Date:  05/29/17               Expected Discharge Plan:  Home/Self Care  In-House Referral:     Discharge planning Services  CM Consult  Post Acute Care Choice:    Choice offered to:     DME Arranged:    DME Agency:     HH Arranged:    HH Agency:     Status of Service:  Completed, signed off  If discussed at [employer] of Stay Meetings, dates discussed:    Additional Comments:  Dessa Phi, RN 05/29/2017, 12:52 PM

## 2017-05-29 NOTE — Progress Notes (Signed)
[place] for warfarin Indication: Mechanical aortic valve  Allergies  Allergen Reactions  . Vicodin [Hydrocodone-Acetaminophen]     Severe sensitivity  . Zocor [Simvastatin]     Short memory loss.  [name] [Olmesartan]     Abdominal cramping and increased stools/ irritable bowel  . Codeine Nausea And Vomiting  . Lopid [Gemfibrozil] Other (See Comments)    transaminitis  . Monopril [Fosinopril]     transaminitis  . Phenergan [Promethazine Hcl]     Severe somnolence.  . Prilosec [Omeprazole]     dyspepsia    Patient Measurements: Height: 5\' 10"  (177.8 cm) Weight: 185 lb 3 oz (84 kg) IBW/kg (Calculated) : 73  Vital Signs: Temp: 99.6 F (37.6 C) (09/26 0528) Temp Source: Oral (09/26 0528) BP: 123/89 (09/26 0528) Pulse Rate: 68 (09/26 0528)  Labs:  Recent Labs  05/27/17 0604 05/28/17 0558 05/29/17 0603  HGB 13.6  --   --   HCT 40.5  --   --   PLT 146*  --   --   LABPROT 25.6* 22.7* 23.7*  INR 2.36 2.02 2.13  CREATININE 1.18  --   --     Estimated Creatinine Clearance: 49.8 mL/min (by C-G formula based on SCr of 1.18 mg/dL).  Assessment: 83yo M admitted with urinary complaints and shaking chills. Rocephin started for UTI. Patient on chronic warfarin for [employer] aortic valve. INR therapeutic on admission and had trended up when checked 5 hours later with AM labs. Last warfarin dose was on 9/22.   Home warfarin regimen: 7.5mg  daily with 5mg  on Su/Wed  Today, 05/29/2017  INR = 2.13 (below goal)  CBC: Hgb WNL and pltc slightly decreased on 9/24   Diet: Heart Healthy  Drug- Drug interactions: antibiotics may increase sensitvity to warfarin   Goal of Therapy:  INR 2.5-3.5 (higher INR goal due to PAF + Mechanical AVR) Monitor platelets by anticoagulation protocol: Yes   Plan:   Warfarin 7.5mg  PO x 1 (increase dose as INR below goal)  Monitor trend closely with acute illness  Consider bridge therapy as  appropriate  Check PT/INR daily.  Dolly Rias RPh 05/29/2017, 1:08 PM Pager [phone]

## 2017-06-01 LAB — CULTURE, BLOOD (ROUTINE X 2)
CULTURE: NO GROWTH
CULTURE: NO GROWTH
SPECIAL REQUESTS: ADEQUATE
Special Requests: ADEQUATE

## 2017-06-04 DIAGNOSIS — Z7901 Long term (current) use of anticoagulants: Secondary | ICD-10-CM | POA: Diagnosis not present

## 2017-06-04 DIAGNOSIS — R791 Abnormal coagulation profile: Secondary | ICD-10-CM | POA: Diagnosis not present

## 2017-06-11 DIAGNOSIS — Z7901 Long term (current) use of anticoagulants: Secondary | ICD-10-CM | POA: Diagnosis not present

## 2017-06-13 DIAGNOSIS — I482 Chronic atrial fibrillation: Secondary | ICD-10-CM | POA: Diagnosis not present

## 2017-06-13 DIAGNOSIS — I10 Essential (primary) hypertension: Secondary | ICD-10-CM | POA: Diagnosis not present

## 2017-06-13 DIAGNOSIS — N390 Urinary tract infection, site not specified: Secondary | ICD-10-CM | POA: Diagnosis not present

## 2017-06-13 DIAGNOSIS — N401 Enlarged prostate with lower urinary tract symptoms: Secondary | ICD-10-CM | POA: Diagnosis not present

## 2017-06-13 DIAGNOSIS — R351 Nocturia: Secondary | ICD-10-CM | POA: Diagnosis not present

## 2017-06-14 DIAGNOSIS — H43813 Vitreous degeneration, bilateral: Secondary | ICD-10-CM | POA: Diagnosis not present

## 2017-06-14 DIAGNOSIS — H353221 Exudative age-related macular degeneration, left eye, with active choroidal neovascularization: Secondary | ICD-10-CM | POA: Diagnosis not present

## 2017-06-14 DIAGNOSIS — H3581 Retinal edema: Secondary | ICD-10-CM | POA: Diagnosis not present

## 2017-06-14 DIAGNOSIS — H353112 Nonexudative age-related macular degeneration, right eye, intermediate dry stage: Secondary | ICD-10-CM | POA: Diagnosis not present

## 2017-07-01 DIAGNOSIS — E039 Hypothyroidism, unspecified: Secondary | ICD-10-CM | POA: Diagnosis not present

## 2017-07-01 DIAGNOSIS — E782 Mixed hyperlipidemia: Secondary | ICD-10-CM | POA: Diagnosis not present

## 2017-07-01 DIAGNOSIS — I482 Chronic atrial fibrillation: Secondary | ICD-10-CM | POA: Diagnosis not present

## 2017-07-01 DIAGNOSIS — N183 Chronic kidney disease, stage 3 (moderate): Secondary | ICD-10-CM | POA: Diagnosis not present

## 2017-07-01 DIAGNOSIS — I10 Essential (primary) hypertension: Secondary | ICD-10-CM | POA: Diagnosis not present

## 2017-07-01 DIAGNOSIS — D509 Iron deficiency anemia, unspecified: Secondary | ICD-10-CM | POA: Diagnosis not present

## 2017-07-01 DIAGNOSIS — I2510 Atherosclerotic heart disease of native coronary artery without angina pectoris: Secondary | ICD-10-CM | POA: Diagnosis not present

## 2017-07-01 DIAGNOSIS — M169 Osteoarthritis of hip, unspecified: Secondary | ICD-10-CM | POA: Diagnosis not present

## 2017-07-01 DIAGNOSIS — N401 Enlarged prostate with lower urinary tract symptoms: Secondary | ICD-10-CM | POA: Diagnosis not present

## 2017-07-07 DIAGNOSIS — I10 Essential (primary) hypertension: Secondary | ICD-10-CM | POA: Diagnosis not present

## 2017-07-07 DIAGNOSIS — I2510 Atherosclerotic heart disease of native coronary artery without angina pectoris: Secondary | ICD-10-CM | POA: Diagnosis not present

## 2017-07-07 DIAGNOSIS — D509 Iron deficiency anemia, unspecified: Secondary | ICD-10-CM | POA: Diagnosis not present

## 2017-07-07 DIAGNOSIS — N401 Enlarged prostate with lower urinary tract symptoms: Secondary | ICD-10-CM | POA: Diagnosis not present

## 2017-07-07 DIAGNOSIS — I482 Chronic atrial fibrillation: Secondary | ICD-10-CM | POA: Diagnosis not present

## 2017-07-07 DIAGNOSIS — E039 Hypothyroidism, unspecified: Secondary | ICD-10-CM | POA: Diagnosis not present

## 2017-07-07 DIAGNOSIS — M169 Osteoarthritis of hip, unspecified: Secondary | ICD-10-CM | POA: Diagnosis not present

## 2017-07-07 DIAGNOSIS — N183 Chronic kidney disease, stage 3 (moderate): Secondary | ICD-10-CM | POA: Diagnosis not present

## 2017-07-07 DIAGNOSIS — E782 Mixed hyperlipidemia: Secondary | ICD-10-CM | POA: Diagnosis not present

## 2017-07-09 DIAGNOSIS — I482 Chronic atrial fibrillation: Secondary | ICD-10-CM | POA: Diagnosis not present

## 2017-07-30 DIAGNOSIS — H43813 Vitreous degeneration, bilateral: Secondary | ICD-10-CM | POA: Diagnosis not present

## 2017-07-30 DIAGNOSIS — H353111 Nonexudative age-related macular degeneration, right eye, early dry stage: Secondary | ICD-10-CM | POA: Diagnosis not present

## 2017-07-30 DIAGNOSIS — H353221 Exudative age-related macular degeneration, left eye, with active choroidal neovascularization: Secondary | ICD-10-CM | POA: Diagnosis not present

## 2017-08-06 DIAGNOSIS — Z7901 Long term (current) use of anticoagulants: Secondary | ICD-10-CM | POA: Diagnosis not present

## 2017-08-14 DIAGNOSIS — N401 Enlarged prostate with lower urinary tract symptoms: Secondary | ICD-10-CM | POA: Diagnosis not present

## 2017-08-14 DIAGNOSIS — N390 Urinary tract infection, site not specified: Secondary | ICD-10-CM | POA: Diagnosis not present

## 2017-09-06 DIAGNOSIS — Z7901 Long term (current) use of anticoagulants: Secondary | ICD-10-CM | POA: Diagnosis not present

## 2017-09-10 DIAGNOSIS — H353221 Exudative age-related macular degeneration, left eye, with active choroidal neovascularization: Secondary | ICD-10-CM | POA: Diagnosis not present

## 2017-09-10 DIAGNOSIS — H353111 Nonexudative age-related macular degeneration, right eye, early dry stage: Secondary | ICD-10-CM | POA: Diagnosis not present

## 2017-09-10 DIAGNOSIS — H43813 Vitreous degeneration, bilateral: Secondary | ICD-10-CM | POA: Diagnosis not present

## 2017-10-04 DIAGNOSIS — Z7901 Long term (current) use of anticoagulants: Secondary | ICD-10-CM | POA: Diagnosis not present

## 2017-11-01 DIAGNOSIS — Z7901 Long term (current) use of anticoagulants: Secondary | ICD-10-CM | POA: Diagnosis not present

## 2017-11-05 DIAGNOSIS — H43813 Vitreous degeneration, bilateral: Secondary | ICD-10-CM | POA: Diagnosis not present

## 2017-11-05 DIAGNOSIS — H353221 Exudative age-related macular degeneration, left eye, with active choroidal neovascularization: Secondary | ICD-10-CM | POA: Diagnosis not present

## 2017-11-05 DIAGNOSIS — H353111 Nonexudative age-related macular degeneration, right eye, early dry stage: Secondary | ICD-10-CM | POA: Diagnosis not present

## 2017-11-06 DIAGNOSIS — I2510 Atherosclerotic heart disease of native coronary artery without angina pectoris: Secondary | ICD-10-CM | POA: Diagnosis not present

## 2017-11-06 DIAGNOSIS — E039 Hypothyroidism, unspecified: Secondary | ICD-10-CM | POA: Diagnosis not present

## 2017-11-06 DIAGNOSIS — Z79899 Other long term (current) drug therapy: Secondary | ICD-10-CM | POA: Diagnosis not present

## 2017-11-06 DIAGNOSIS — E559 Vitamin D deficiency, unspecified: Secondary | ICD-10-CM | POA: Diagnosis not present

## 2017-11-06 DIAGNOSIS — N390 Urinary tract infection, site not specified: Secondary | ICD-10-CM | POA: Diagnosis not present

## 2017-11-06 DIAGNOSIS — D509 Iron deficiency anemia, unspecified: Secondary | ICD-10-CM | POA: Diagnosis not present

## 2017-11-06 DIAGNOSIS — N183 Chronic kidney disease, stage 3 (moderate): Secondary | ICD-10-CM | POA: Diagnosis not present

## 2017-11-06 DIAGNOSIS — Z7901 Long term (current) use of anticoagulants: Secondary | ICD-10-CM | POA: Diagnosis not present

## 2017-11-06 DIAGNOSIS — N401 Enlarged prostate with lower urinary tract symptoms: Secondary | ICD-10-CM | POA: Diagnosis not present

## 2017-11-06 DIAGNOSIS — I10 Essential (primary) hypertension: Secondary | ICD-10-CM | POA: Diagnosis not present

## 2017-11-06 DIAGNOSIS — N5201 Erectile dysfunction due to arterial insufficiency: Secondary | ICD-10-CM | POA: Diagnosis not present

## 2017-11-06 DIAGNOSIS — I482 Chronic atrial fibrillation: Secondary | ICD-10-CM | POA: Diagnosis not present

## 2017-12-19 DIAGNOSIS — R300 Dysuria: Secondary | ICD-10-CM | POA: Diagnosis not present

## 2017-12-24 DIAGNOSIS — Z7901 Long term (current) use of anticoagulants: Secondary | ICD-10-CM | POA: Diagnosis not present

## 2017-12-25 ENCOUNTER — Encounter: Payer: Self-pay | Admitting: Interventional Cardiology

## 2018-01-01 ENCOUNTER — Other Ambulatory Visit: Payer: Self-pay | Admitting: Nurse Practitioner

## 2018-01-01 DIAGNOSIS — R1010 Upper abdominal pain, unspecified: Secondary | ICD-10-CM | POA: Diagnosis not present

## 2018-01-01 DIAGNOSIS — R195 Other fecal abnormalities: Secondary | ICD-10-CM | POA: Diagnosis not present

## 2018-01-01 DIAGNOSIS — R300 Dysuria: Secondary | ICD-10-CM | POA: Diagnosis not present

## 2018-01-01 DIAGNOSIS — Z7901 Long term (current) use of anticoagulants: Secondary | ICD-10-CM | POA: Diagnosis not present

## 2018-01-02 ENCOUNTER — Ambulatory Visit
Admission: RE | Admit: 2018-01-02 | Discharge: 2018-01-02 | Disposition: A | Discharge status: Home / Self Care | Payer: Medicare Other | Source: Ambulatory Visit | Attending: Nurse Practitioner | Admitting: Nurse Practitioner

## 2018-01-02 DIAGNOSIS — R1010 Upper abdominal pain, unspecified: Secondary | ICD-10-CM

## 2018-01-02 DIAGNOSIS — R031 Nonspecific low blood-pressure reading: Secondary | ICD-10-CM | POA: Diagnosis not present

## 2018-01-02 MED ORDER — IOPAMIDOL (ISOVUE-300) INJECTION 61%
100.0000 mL | Freq: Once | INTRAVENOUS | Status: AC | PRN
  Administered 2018-01-02: 100 mL via INTRAVENOUS

## 2018-01-03 ENCOUNTER — Inpatient Hospital Stay (HOSPITAL_COMMUNITY)
Admission: EM | Admit: 2018-01-03 | Discharge: 2018-01-08 | DRG: 312 | Disposition: A | Discharge status: Home / Self Care | Payer: Medicare Other | Attending: Internal Medicine | Admitting: Internal Medicine

## 2018-01-03 ENCOUNTER — Other Ambulatory Visit: Payer: Self-pay

## 2018-01-03 ENCOUNTER — Encounter (HOSPITAL_COMMUNITY): Payer: Self-pay | Admitting: Cardiology

## 2018-01-03 ENCOUNTER — Inpatient Hospital Stay (HOSPITAL_COMMUNITY): Payer: Medicare Other

## 2018-01-03 DIAGNOSIS — D62 Acute posthemorrhagic anemia: Secondary | ICD-10-CM | POA: Diagnosis present

## 2018-01-03 DIAGNOSIS — K319 Disease of stomach and duodenum, unspecified: Secondary | ICD-10-CM | POA: Diagnosis present

## 2018-01-03 DIAGNOSIS — R634 Abnormal weight loss: Secondary | ICD-10-CM | POA: Diagnosis present

## 2018-01-03 DIAGNOSIS — K648 Other hemorrhoids: Secondary | ICD-10-CM | POA: Diagnosis present

## 2018-01-03 DIAGNOSIS — I10 Essential (primary) hypertension: Secondary | ICD-10-CM | POA: Diagnosis not present

## 2018-01-03 DIAGNOSIS — N390 Urinary tract infection, site not specified: Secondary | ICD-10-CM | POA: Diagnosis present

## 2018-01-03 DIAGNOSIS — I272 Pulmonary hypertension, unspecified: Secondary | ICD-10-CM | POA: Diagnosis present

## 2018-01-03 DIAGNOSIS — K5790 Diverticulosis of intestine, part unspecified, without perforation or abscess without bleeding: Secondary | ICD-10-CM | POA: Diagnosis not present

## 2018-01-03 DIAGNOSIS — Z952 Presence of prosthetic heart valve: Secondary | ICD-10-CM

## 2018-01-03 DIAGNOSIS — I340 Nonrheumatic mitral (valve) insufficiency: Secondary | ICD-10-CM

## 2018-01-03 DIAGNOSIS — Z87891 Personal history of nicotine dependence: Secondary | ICD-10-CM

## 2018-01-03 DIAGNOSIS — I482 Chronic atrial fibrillation: Secondary | ICD-10-CM | POA: Diagnosis not present

## 2018-01-03 DIAGNOSIS — I25110 Atherosclerotic heart disease of native coronary artery with unstable angina pectoris: Secondary | ICD-10-CM

## 2018-01-03 DIAGNOSIS — I480 Paroxysmal atrial fibrillation: Secondary | ICD-10-CM | POA: Diagnosis not present

## 2018-01-03 DIAGNOSIS — Z6825 Body mass index (BMI) 25.0-25.9, adult: Secondary | ICD-10-CM

## 2018-01-03 DIAGNOSIS — K2980 Duodenitis without bleeding: Secondary | ICD-10-CM | POA: Diagnosis not present

## 2018-01-03 DIAGNOSIS — I493 Ventricular premature depolarization: Secondary | ICD-10-CM | POA: Diagnosis present

## 2018-01-03 DIAGNOSIS — N400 Enlarged prostate without lower urinary tract symptoms: Secondary | ICD-10-CM | POA: Diagnosis present

## 2018-01-03 DIAGNOSIS — Z7901 Long term (current) use of anticoagulants: Secondary | ICD-10-CM

## 2018-01-03 DIAGNOSIS — E785 Hyperlipidemia, unspecified: Secondary | ICD-10-CM | POA: Diagnosis present

## 2018-01-03 DIAGNOSIS — K5730 Diverticulosis of large intestine without perforation or abscess without bleeding: Secondary | ICD-10-CM | POA: Diagnosis present

## 2018-01-03 DIAGNOSIS — E861 Hypovolemia: Secondary | ICD-10-CM | POA: Diagnosis present

## 2018-01-03 DIAGNOSIS — R55 Syncope and collapse: Secondary | ICD-10-CM

## 2018-01-03 DIAGNOSIS — D689 Coagulation defect, unspecified: Secondary | ICD-10-CM | POA: Diagnosis not present

## 2018-01-03 DIAGNOSIS — I25810 Atherosclerosis of coronary artery bypass graft(s) without angina pectoris: Secondary | ICD-10-CM | POA: Diagnosis present

## 2018-01-03 DIAGNOSIS — K2950 Unspecified chronic gastritis without bleeding: Secondary | ICD-10-CM | POA: Diagnosis not present

## 2018-01-03 DIAGNOSIS — D649 Anemia, unspecified: Secondary | ICD-10-CM | POA: Diagnosis not present

## 2018-01-03 DIAGNOSIS — K921 Melena: Secondary | ICD-10-CM

## 2018-01-03 DIAGNOSIS — Z885 Allergy status to narcotic agent status: Secondary | ICD-10-CM

## 2018-01-03 DIAGNOSIS — Z8249 Family history of ischemic heart disease and other diseases of the circulatory system: Secondary | ICD-10-CM

## 2018-01-03 DIAGNOSIS — K922 Gastrointestinal hemorrhage, unspecified: Secondary | ICD-10-CM | POA: Diagnosis not present

## 2018-01-03 DIAGNOSIS — K5710 Diverticulosis of small intestine without perforation or abscess without bleeding: Secondary | ICD-10-CM | POA: Diagnosis present

## 2018-01-03 DIAGNOSIS — Z79899 Other long term (current) drug therapy: Secondary | ICD-10-CM

## 2018-01-03 DIAGNOSIS — I4892 Unspecified atrial flutter: Secondary | ICD-10-CM | POA: Diagnosis not present

## 2018-01-03 DIAGNOSIS — I951 Orthostatic hypotension: Principal | ICD-10-CM | POA: Diagnosis present

## 2018-01-03 DIAGNOSIS — Z79890 Hormone replacement therapy (postmenopausal): Secondary | ICD-10-CM

## 2018-01-03 DIAGNOSIS — I2510 Atherosclerotic heart disease of native coronary artery without angina pectoris: Secondary | ICD-10-CM | POA: Diagnosis not present

## 2018-01-03 DIAGNOSIS — E039 Hypothyroidism, unspecified: Secondary | ICD-10-CM | POA: Diagnosis present

## 2018-01-03 DIAGNOSIS — K649 Unspecified hemorrhoids: Secondary | ICD-10-CM | POA: Diagnosis not present

## 2018-01-03 DIAGNOSIS — K254 Chronic or unspecified gastric ulcer with hemorrhage: Secondary | ICD-10-CM | POA: Diagnosis present

## 2018-01-03 DIAGNOSIS — Z66 Do not resuscitate: Secondary | ICD-10-CM | POA: Diagnosis present

## 2018-01-03 DIAGNOSIS — K259 Gastric ulcer, unspecified as acute or chronic, without hemorrhage or perforation: Secondary | ICD-10-CM | POA: Diagnosis not present

## 2018-01-03 DIAGNOSIS — Z888 Allergy status to other drugs, medicaments and biological substances status: Secondary | ICD-10-CM

## 2018-01-03 DIAGNOSIS — K3189 Other diseases of stomach and duodenum: Secondary | ICD-10-CM | POA: Diagnosis not present

## 2018-01-03 DIAGNOSIS — Z7982 Long term (current) use of aspirin: Secondary | ICD-10-CM

## 2018-01-03 HISTORY — DX: Hypothyroidism, unspecified: E03.9

## 2018-01-03 LAB — URINALYSIS, ROUTINE W REFLEX MICROSCOPIC
BILIRUBIN URINE: NEGATIVE
Glucose, UA: 50 mg/dL — AB
Hgb urine dipstick: NEGATIVE
Ketones, ur: NEGATIVE mg/dL
Leukocytes, UA: NEGATIVE
NITRITE: NEGATIVE
PH: 5 (ref 5.0–8.0)
Protein, ur: NEGATIVE mg/dL
SPECIFIC GRAVITY, URINE: 1.017 (ref 1.005–1.030)

## 2018-01-03 LAB — ABO/RH: ABO/RH(D): A POS

## 2018-01-03 LAB — CBC WITH DIFFERENTIAL/PLATELET
Basophils Absolute: 0 10*3/uL (ref 0.0–0.1)
Basophils Relative: 0 %
Eosinophils Absolute: 0.1 10*3/uL (ref 0.0–0.7)
Eosinophils Relative: 1 %
HEMATOCRIT: 20.7 % — AB (ref 39.0–52.0)
HEMOGLOBIN: 6.8 g/dL — AB (ref 13.0–17.0)
LYMPHS ABS: 2 10*3/uL (ref 0.7–4.0)
Lymphocytes Relative: 14 %
MCH: 34.3 pg — AB (ref 26.0–34.0)
MCHC: 32.9 g/dL (ref 30.0–36.0)
MCV: 104.5 fL — ABNORMAL HIGH (ref 78.0–100.0)
MONOS PCT: 8 %
Monocytes Absolute: 1.2 10*3/uL — ABNORMAL HIGH (ref 0.1–1.0)
NEUTROS ABS: 10.8 10*3/uL — AB (ref 1.7–7.7)
NEUTROS PCT: 77 %
Platelets: 170 10*3/uL (ref 150–400)
RBC: 1.98 MIL/uL — ABNORMAL LOW (ref 4.22–5.81)
RDW: 13.8 % (ref 11.5–15.5)
WBC: 14 10*3/uL — ABNORMAL HIGH (ref 4.0–10.5)

## 2018-01-03 LAB — ECHOCARDIOGRAM COMPLETE
Height: 70 in
WEIGHTICAEL: 2899.49 [oz_av]

## 2018-01-03 LAB — COMPREHENSIVE METABOLIC PANEL
ALT: 22 U/L (ref 17–63)
AST: 36 U/L (ref 15–41)
Albumin: 2.9 g/dL — ABNORMAL LOW (ref 3.5–5.0)
Alkaline Phosphatase: 34 U/L — ABNORMAL LOW (ref 38–126)
Anion gap: 7 (ref 5–15)
BILIRUBIN TOTAL: 0.7 mg/dL (ref 0.3–1.2)
BUN: 49 mg/dL — ABNORMAL HIGH (ref 6–20)
CHLORIDE: 111 mmol/L (ref 101–111)
CO2: 21 mmol/L — AB (ref 22–32)
Calcium: 7.9 mg/dL — ABNORMAL LOW (ref 8.9–10.3)
Creatinine, Ser: 1.03 mg/dL (ref 0.61–1.24)
GFR calc Af Amer: >60 mL/min (ref >60)
Glucose, Bld: 159 mg/dL — ABNORMAL HIGH (ref 65–99)
Potassium: 4.1 mmol/L (ref 3.5–5.1)
Sodium: 139 mmol/L (ref 135–145)
TOTAL PROTEIN: 5.5 g/dL — AB (ref 6.5–8.1)

## 2018-01-03 LAB — I-STAT TROPONIN, ED: Troponin i, poc: 0 ng/mL (ref 0.00–0.08)

## 2018-01-03 LAB — POC OCCULT BLOOD, ED: Fecal Occult Bld: POSITIVE — AB

## 2018-01-03 LAB — PROTIME-INR
INR: 3.67
Prothrombin Time: 36.2 seconds — ABNORMAL HIGH (ref 11.4–15.2)

## 2018-01-03 LAB — APTT: aPTT: 33 seconds (ref 24–36)

## 2018-01-03 LAB — PREPARE RBC (CROSSMATCH)

## 2018-01-03 MED ORDER — SODIUM CHLORIDE 0.9% FLUSH
3.0000 mL | Freq: Two times a day (BID) | INTRAVENOUS | Status: DC
  Administered 2018-01-03 – 2018-01-08 (×6): 3 mL via INTRAVENOUS

## 2018-01-03 MED ORDER — ONDANSETRON HCL 4 MG PO TABS: 4.0000 mg | ORAL_TABLET | Freq: Four times a day (QID) | ORAL | Status: DC | PRN

## 2018-01-03 MED ORDER — PANTOPRAZOLE SODIUM 40 MG PO TBEC
40.0000 mg | DELAYED_RELEASE_TABLET | Freq: Two times a day (BID) | ORAL | Status: DC
  Administered 2018-01-03 – 2018-01-05 (×4): 40 mg via ORAL
  Filled 2018-01-03 (×5): qty 1

## 2018-01-03 MED ORDER — ROSUVASTATIN CALCIUM 20 MG PO TABS
20.0000 mg | ORAL_TABLET | ORAL | Status: DC
  Administered 2018-01-03 – 2018-01-08 (×3): 20 mg via ORAL
  Filled 2018-01-03 (×3): qty 1

## 2018-01-03 MED ORDER — ACETAMINOPHEN 650 MG RE SUPP
650.0000 mg | Freq: Four times a day (QID) | RECTAL | Status: DC | PRN
Start: 2018-01-03 — End: 2018-01-08

## 2018-01-03 MED ORDER — FINASTERIDE 5 MG PO TABS
5.0000 mg | ORAL_TABLET | Freq: Every day | ORAL | Status: DC
  Administered 2018-01-03 – 2018-01-08 (×6): 5 mg via ORAL
  Filled 2018-01-03 (×6): qty 1

## 2018-01-03 MED ORDER — SODIUM CHLORIDE 0.9 % IV SOLN: 250.0000 mL | INTRAVENOUS | Status: DC | PRN

## 2018-01-03 MED ORDER — VITAMIN D3 25 MCG (1000 UNIT) PO TABS
2000.0000 [IU] | ORAL_TABLET | Freq: Every day | ORAL | Status: DC
  Administered 2018-01-03 – 2018-01-08 (×6): 2000 [IU] via ORAL
  Filled 2018-01-03 (×6): qty 2

## 2018-01-03 MED ORDER — ACETAMINOPHEN 325 MG PO TABS: 650.0000 mg | ORAL_TABLET | Freq: Four times a day (QID) | ORAL | Status: DC | PRN

## 2018-01-03 MED ORDER — ONDANSETRON HCL 4 MG/2ML IJ SOLN
4.0000 mg | Freq: Four times a day (QID) | INTRAMUSCULAR | Status: DC | PRN
  Administered 2018-01-04 – 2018-01-06 (×2): 4 mg via INTRAVENOUS
  Filled 2018-01-03 (×3): qty 2

## 2018-01-03 MED ORDER — SACCHAROMYCES BOULARDII 250 MG PO CAPS
250.0000 mg | ORAL_CAPSULE | Freq: Two times a day (BID) | ORAL | Status: DC
  Administered 2018-01-03 – 2018-01-08 (×10): 250 mg via ORAL
  Filled 2018-01-03 (×10): qty 1

## 2018-01-03 MED ORDER — SODIUM CHLORIDE 0.9% FLUSH: 3.0000 mL | INTRAVENOUS | Status: DC | PRN

## 2018-01-03 MED ORDER — TAMSULOSIN HCL 0.4 MG PO CAPS
0.4000 mg | ORAL_CAPSULE | Freq: Every day | ORAL | Status: DC
  Administered 2018-01-03 – 2018-01-08 (×6): 0.4 mg via ORAL
  Filled 2018-01-03 (×6): qty 1

## 2018-01-03 MED ORDER — LEVOTHYROXINE SODIUM 75 MCG PO TABS
75.0000 ug | ORAL_TABLET | Freq: Every day | ORAL | Status: DC
  Administered 2018-01-04 – 2018-01-08 (×4): 75 ug via ORAL
  Filled 2018-01-03 (×5): qty 1

## 2018-01-03 MED ORDER — METOPROLOL TARTRATE 50 MG PO TABS
50.0000 mg | ORAL_TABLET | Freq: Two times a day (BID) | ORAL | Status: DC
  Administered 2018-01-03 – 2018-01-08 (×10): 50 mg via ORAL
  Filled 2018-01-03 (×12): qty 1

## 2018-01-03 MED ORDER — MORPHINE SULFATE (PF) 4 MG/ML IV SOLN: 1.0000 mg | INTRAVENOUS | Status: DC | PRN

## 2018-01-03 MED ORDER — FAMOTIDINE IN NACL 20-0.9 MG/50ML-% IV SOLN
20.0000 mg | Freq: Two times a day (BID) | INTRAVENOUS | Status: DC
  Filled 2018-01-03: qty 50

## 2018-01-03 NOTE — ED Notes (Signed)
Call from lab with critical value HBG 6.8 EDP aware

## 2018-01-03 NOTE — H&P (Signed)
History and Physical    Jason Santana AYT:[id number] DOB: 1932/01/10 DOA: 01/03/2018  PCP: [name], MD   Patient coming from: Home  Chief Complaint: Abdominal pain and dark stools.  HPI: Jason Santana is a 84 y.o. male with medical history significant of valvular heart disease with prior mechanical aortic valve replacement (2000), chronic atrial fibrillation, hypertension and coronary artery disease.  Patient complains of 5 days of abdominal pain, initially severe in intensity, sharp in nature, generalized without radiation, no improving or worsening factors, associated with poor appetite and diarrhea, about 10 bowel movements per day with dark stools, large amounts, no clots.  His abdominal pain, diarrhea and dark stools have been improving for the last 5 days and today his pain is down to a 3 out of 10 and only very small amount of dark stools.  Over the last 5 days prior to hospitalization he has been experiencing progressive dyspnea on exertion, easy fatigability and dizziness, to the point that last night he developed a syncope episode when trying to stand while being seated.  This prompted him to come to the hospital for further evaluation.   He was seen in the outpatient clinic, and he had an abdominal CT scan performed.  He has been taking antibiotic therapy for a urinary tract infection.  ED Course: Patient was found hemodynamically stable, severely anemic, PRBC transfusion was ordered, and referred for admission for further evaluation.  Review of Systems:  1. General: No fevers, no chills, positive weight loss 6 lbs over last 5 days.  2. ENT: No runny nose or sore throat, no hearing disturbances 3. Pulmonary: postive dyspnea on exertion, no cough, wheezing, or hemoptysis 4. Cardiovascular: No angina, claudication, lower extremity edema, pnd or orthopnea 5. Gastrointestinal: Positive nausea but no vomiting, positive diarrhea aas mentioned in the HPI. 6. Hematology: No easy  bruisability or frequent infections 7. Urology: No dysuria, hematuria or increased urinary frequency. Decreased urinary stream.  8. Dermatology: No rashes. 9. Neurology: No seizures or paresthesias 10. Musculoskeletal: No joint pain or deformities  Past Medical History:  Diagnosis Date  . Atrial fibrillation ([place]) 11/25/2013  . CAD (coronary artery disease) of artery bypass graft 11/25/2013   2000 with multiple bypasses   . Chronic anticoagulation 11/25/2013   Coumadin therapy for mechanical aortic valve. Postoperative atrial fibrillation.   . Essential hypertension 11/25/2013  . H/O mechanical aortic valve replacement 11/25/2013   St. Jude bowel prosthesis 2000   . Premature ventricular contractions 11/25/2013    No past surgical history on file.   reports that he has quit smoking. His smoking use included cigarettes. He has never used smokeless tobacco. He reports that he does not drink alcohol or use drugs.  Allergies  Allergen Reactions  . Vicodin [Hydrocodone-Acetaminophen]     Severe sensitivity  . Zocor [Simvastatin]     Short memory loss.  [name] [Olmesartan]     Abdominal cramping and increased stools/ irritable bowel  . Codeine Nausea And Vomiting  . Lopid [Gemfibrozil] Other (See Comments)    transaminitis  . Monopril [Fosinopril]     transaminitis  . Phenergan [Promethazine Hcl]     Severe somnolence.  . Prilosec [Omeprazole]     dyspepsia    Family History  Problem Relation Age of Onset  . Hypertension Mother   . Heart attack Mother   . Hypertension Father      Prior to Admission medications   Medication Sig Start Date End Date Taking? Authorizing Provider  amLODipine (NORVASC) 5 MG tablet Take 5 mg by mouth daily before breakfast.    Yes [provider]  amoxicillin-clavulanate (AUGMENTIN) 500-125 MG tablet Take 1 tablet by mouth 2 (two) times daily. 01/01/18  Yes [provider]  aspirin EC 81 MG tablet Take 81 mg by mouth daily before  breakfast.    Yes [provider]  cholecalciferol (VITAMIN D) 1000 UNITS tablet Take 2,000 Units by mouth daily.    Yes [provider]  COUMADIN 5 MG tablet Take 5-7.5 mg by mouth See admin instructions. 5 mg every Monday and Friday and 7.5 mg on all other days 09/18/13  Yes [provider]  finasteride (PROSCAR) 5 MG tablet Take 5 mg by mouth daily. 10/07/17  Yes [provider]  Iron TABS Take 1 tablet by mouth daily. Pt not sure of dosage    Yes [provider]  levothyroxine (SYNTHROID, LEVOTHROID) 75 MCG tablet Take 75 mcg by mouth daily before breakfast.  10/06/13  Yes [provider]  metoprolol tartrate (LOPRESSOR) 50 MG tablet Take 1 tablet (50 mg total) by mouth 2 (two) times daily. 05/29/17  Yes Donne Hazel, MD  Multiple Vitamins-Minerals (PRESERVISION AREDS PO) Take 1 tablet by mouth 2 (two) times daily.   Yes [provider]  rosuvastatin (CRESTOR) 20 MG tablet Take 20 mg by mouth daily. 3 times a week   Yes [provider]  saccharomyces boulardii (FLORASTOR) 250 MG capsule Take 1 capsule (250 mg total) by mouth 2 (two) times daily. 05/29/17  Yes Donne Hazel, MD  tamsulosin (FLOMAX) 0.4 MG CAPS capsule Take 0.4 mg by mouth daily. 10/07/17  Yes [provider]  cefpodoxime (VANTIN) 200 MG tablet Take 1 tablet (200 mg total) by mouth 2 (two) times daily. Patient not taking: Reported on 01/03/2018 05/29/17   Donne Hazel, MD  ondansetron (ZOFRAN) 4 MG tablet Take 1 tablet (4 mg total) by mouth every 6 (six) hours as needed for nausea. Patient not taking: Reported on 01/03/2018 05/29/17   Donne Hazel, MD    Physical Exam: Vitals:   01/03/18 0700 01/03/18 0715 01/03/18 0730 01/03/18 0745  BP: (!) 131/55 (!) 135/52 (!) 121/52 (!) 129/51  Pulse: 81 81 76 80  Resp: (!) 22 18 16 17   Temp:      TempSrc:      SpO2: 99% 99% 100% 99%    Constitutional: deconditioned Vitals:   01/03/18 0700 01/03/18 0715  01/03/18 0730 01/03/18 0745  BP: (!) 131/55 (!) 135/52 (!) 121/52 (!) 129/51  Pulse: 81 81 76 80  Resp: (!) 22 18 16 17   Temp:      TempSrc:      SpO2: 99% 99% 100% 99%   Eyes: PERRL, lids and conjunctivae pale Head normocephalic, nose and ears with no deformities ENMT: Mucous membranes are dry. Posterior pharynx clear of any exudate or lesions.Normal dentition.  Neck: normal, supple, no masses, no thyromegaly Respiratory: clear to auscultation bilaterally, no wheezing, no crackles. Normal respiratory effort. No accessory muscle use.  Cardiovascular: S1, S2  Present, loud S2. Regular rate and rhythm, no murmurs / rubs / gallops. No extremity edema. 2+ pedal pulses. No carotid bruits.  Abdomen: Mild distended and positive central lower quadrant tenderness, no masses palpated. No hepatosplenomegaly. Bowel sounds positive.  Musculoskeletal: no clubbing / cyanosis. No joint deformity upper and lower extremities. Good ROM, no contractures. Normal muscle tone.  Skin: no rashes, lesions, ulcers. No induration Neurologic: CN  2-12 grossly intact. Sensation intact, DTR normal. Strength 5/5 in all 4.     Labs on Admission: I have personally reviewed following labs and imaging studies  CBC: Recent Labs  Lab 01/03/18 0228  WBC 14.0*  NEUTROABS 10.8*  HGB 6.8*  HCT 20.7*  MCV 104.5*  PLT 267   Basic Metabolic Panel: Recent Labs  Lab 01/03/18 0228  NA 139  K 4.1  CL 111  CO2 21*  GLUCOSE 159*  BUN 49*  CREATININE 1.03  CALCIUM 7.9*   GFR: CrCl cannot be calculated (Unknown ideal weight.). Liver Function Tests: Recent Labs  Lab 01/03/18 0228  AST 36  ALT 22  ALKPHOS 34*  BILITOT 0.7  PROT 5.5*  ALBUMIN 2.9*   No results for input(s): LIPASE, AMYLASE in the last 168 hours. No results for input(s): AMMONIA in the last 168 hours. Coagulation Profile: Recent Labs  Lab 01/03/18 0315  INR 3.67   Cardiac Enzymes: No results for input(s): CKTOTAL, CKMB, CKMBINDEX,  TROPONINI in the last 168 hours. BNP (last 3 results) No results for input(s): PROBNP in the last 8760 hours. HbA1C: No results for input(s): HGBA1C in the last 72 hours. CBG: No results for input(s): GLUCAP in the last 168 hours. Lipid Profile: No results for input(s): CHOL, HDL, LDLCALC, TRIG, CHOLHDL, LDLDIRECT in the last 72 hours. Thyroid Function Tests: No results for input(s): TSH, T4TOTAL, FREET4, T3FREE, THYROIDAB in the last 72 hours. Anemia Panel: No results for input(s): VITAMINB12, FOLATE, FERRITIN, TIBC, IRON, RETICCTPCT in the last 72 hours. Urine analysis:    Component Value Date/Time   COLORURINE YELLOW 05/25/2017 2012   APPEARANCEUR HAZY (A) 05/25/2017 2012   LABSPEC 1.014 05/25/2017 2012   PHURINE 7.0 05/25/2017 2012   GLUCOSEU NEGATIVE 05/25/2017 2012   HGBUR NEGATIVE 05/25/2017 2012   [place] NEGATIVE 05/25/2017 2012   KETONESUR 5 (A) 05/25/2017 2012   PROTEINUR NEGATIVE 05/25/2017 2012   NITRITE NEGATIVE 05/25/2017 2012   LEUKOCYTESUR LARGE (A) 05/25/2017 2012    Radiological Exams on Admission: Ct Abdomen Pelvis W Contrast  Result Date: 01/02/2018 CLINICAL DATA:  84 year old male with upper abdominal pain, severe nausea and weight loss. She has also had 2 episodes of bloody stools. EXAM: CT ABDOMEN AND PELVIS WITH CONTRAST TECHNIQUE: Multidetector CT imaging of the abdomen and pelvis was performed using the standard protocol following bolus administration of intravenous contrast. CONTRAST:  1106mL ISOVUE-300 IOPAMIDOL (ISOVUE-300) INJECTION 61% COMPARISON:  Prior CT scan of the abdomen and pelvis 05/26/2017 FINDINGS: Lower chest: Paraseptal pulmonary emphysema. Incompletely imaged prosthetic aortic valve. The heart is normal in size. No pericardial effusion. Unremarkable visualized distal thoracic esophagus. Hepatobiliary: No focal liver abnormality is seen. Status post cholecystectomy. No biliary dilatation. Pancreas: Unremarkable. No pancreatic ductal  dilatation or surrounding inflammatory changes. Spleen: Normal in size without focal abnormality. Adrenals/Urinary Tract: Normal adrenal glands. No enhancing renal lesion, hydronephrosis or nephrolithiasis. Circumscribed low-attenuation lesions present bilaterally. The largest measures 1.6 cm and is consistent with a simple cyst. The smaller lesions are too small to characterize but also statistically highly likely benign cysts. Stomach/Bowel: Colonic diverticular disease without CT evidence of active inflammation. No evidence of obstruction or focal bowel wall thickening. Normal appendix in the right lower quadrant. The terminal ileum is unremarkable. Vascular/Lymphatic: Atherosclerotic calcifications present throughout the abdominal aorta. Probable moderate focal stenosis of the origin of the superior mesenteric artery secondary to calcified atherosclerotic plaque. The celiac artery is widely patent. The IMA remains patent. No aneurysm or dissection. Reproductive: Prostate  is unremarkable. Other: No abdominal wall hernia or abnormality. No abdominopelvic ascites. Musculoskeletal: No acute fracture or aggressive appearing lytic or blastic osseous lesion. Incompletely imaged healed median sternotomy. IMPRESSION: 1. No acute abnormality within the abdomen or pelvis. 2. Calcified atherosclerotic plaque likely results in at least moderate stenosis of the origin of the superior mesenteric artery. The celiac artery remains widely patent as does the IMA. 3. Colonic diverticular disease without CT evidence of active inflammation. 4. Surgical changes of prior cholecystectomy. 5.  Aortic Atherosclerosis (ICD10-170.0) 6.  Emphysema (ICD10-J43.9). Electronically Signed   By: Jacqulynn Cadet M.D.   On: 01/02/2018 14:27    EKG: Independently reviewed.  Sinus rhythm, left axis deviation, biphasic T waves in the precordial septal leads.   Assessment/Plan Active Problems:   GI bleed  84 year old male who presents with 5  days of rectal bleeding and abdominal pain, associated with nausea, poor appetite and weight loss.  His gastrointestinal symptoms have been improving but he reports worsening dyspnea on exertion, easy fatigability and dizziness, positive syncope episode related to orthostasis last night.  On the initial physical examination blood pressure 121/52, heart rate 79, respiratory rate 16, oxygen saturation 100%, he is pale, dry mucous membranes, lungs clear to auscultation bilaterally, heart S1-S2 present, loud S2, abdomen mildly distended, tender at the lower quadrants, no rebound or guarding, no lower extremity edema.  Sodium 139, potassium 4.1, chloride 111, bicarb 21, glucose 159, BUN 49, creatinine 1.03, calcium 7.9, AST 36, ALT 22, white count 14.0, hemoglobin 6.8, hematocrit 20.7, platelets 170.  INR 3.6.  CT of the abdomen May 2, no acute abnormality within the abdomen or pelvis, colonic diverticular disease without CT evidence of acute inflammation.  Calcified arthrosclerotic plaque likely result in at least moderate stenosis of the origin of the superior mesenteric artery.  The celiac artery remains widely patent as does the IMA.   Patient will be admitted to the hospital with the working diagnosis of acute symptomatic anemia related to blood loss due to suspected lower GI bleed due to ischemic colitis, in the setting of vitamin K antagonist coagulopathy, supratherapeutic INR.  1.  Symptomatic anemia, due to acute blood loss, suspected lower GI bleed due to ischemic colitis in the setting of vitamin K antagonist coagulopathy.  Patient will be admitted to the medical ward, he will be placed on a remote telemetry monitor.  2 units of packed red blood cells have been ordered in the emergency department, and will follow cell count after PRBC transfusion.  Hold warfarin and aspirin, continue IV analgesics, IV antiacids and as needed antiemetics.  Clear liquid diet for now.  Will consult gastroenterology, patient  may need further endoscopic evaluation to confirm diagnosis.  Apparently the bleeding has stopped, a recent CT scan shows no acute changes but significant atherosclerotic disease in the superior mesenteric artery.   2.  Valvular heart disease status post mechanical aortic valve replacement.  No signs of decompensation, continue holding anticoagulation, target INR 2-3.  Today INR is 3.6, hold warfarin for now.  Check INR in the morning.  Hold aspirin.  3.  Chronic atrial fibrillation status post cardioversion.  Currently patient in sinus rhythm, continue metoprolol 50 mg twice daily, telemetry monitoring, hold anticoagulation in the setting of acute blood loss anemia.  4.  Hypertension.  Will hold amlodipine to decrease risk of hypotension  5.  Urinary tract infection.  It was diagnosed as an outpatient, patient has been on Augmentin, will hold antibiotic therapy to avoid further  drug-drug interaction with vitamin K antagonist.  Follow-up on urine analysis.  6.  BPH. Continue tamsulosin and finasteride..  7.  Hypothyroidism.  Continue levothyroxine.  8.  Dyslipidemia.  Continue rosuvastatin.   DVT prophylaxis: scd Code Status:  DNR  Family Communication: I spoke with patient's wife at the bedside and all questions were addressed.   Disposition Plan: home after completing workup  Consults called: GI [facility])  Admission status:  Inpatient.     Mauricio Gerome Apley MD [facility] Pager [phone]  If 7PM-7AM, please contact night-coverage [email or web address] Password [email or web address]  01/03/2018, 8:10 AM

## 2018-01-03 NOTE — ED Triage Notes (Addendum)
Pt from home called EMS d/t near syncopal episodes onset  2200 tonight. Denies SOB, chest pain , or injury. Prolonged QT NOTED on 12 lead per EMS . Orthostatic VS lying 134/78 hr 80 sitting 114/78 hr 76 standing 99/57 hr 94. Pt reports to EMS hat he has lost 8 lbs over the last week.

## 2018-01-03 NOTE — Consult Note (Addendum)
Cardiology Consultation:   Patient ID: Jason Santana; [id number]; [birth year]   Admit date: 01/03/2018 Date of Consult: 01/03/2018  Primary Care Provider: [name], MD Primary Cardiologist: Dr Tamala Julian   Patient Profile:   Jason Santana is a 84 y.o. male with a hx of remote CABG and St Jude AVR in 2000 who is being seen today for Jason evaluation of GI bleed at Jason request of Dr Herbert Moors.  History of Present Illness:   Jason Santana is a pleasant 84 y/o male followed by Dr Tamala Julian and Dr Inda Merlin. Jason Santana has a history of CABG and St Jude AVR in 2000. Jason Santana has not required cath since and has done well from a cardiac standpoint. Jason Santana did have PAF in Dec 2017 and was cardioverted to NSR. Jason Santana is on chronic warfarin anticoagulation followed by Dr Inda Merlin. Jason Santana had an echo in 2016 that showed overall normal valve function and LVF. There was a string-like structure on Jason valve. Could be fibrin(clot). Aspirin 81 mg M, W, F was recomended.   Jason Santana is seen now in Jason ED at [facility]. Jason Santana had a syncopal spell. In Jason ED his Hgb is 6.8. Jason Santana had a "UTI" two weeks ago and had a course of antibiotics. His warfarin was appropriately decreased. Jason Santana then was given a course of antibiotics for suspected diverticulitis last week. Earlier this week Jason Santana had frank melena.   Past Medical History:  Diagnosis Date  . Atrial fibrillation ([place])   . CAD (coronary artery disease) of artery bypass graft 2000   2000 with multiple bypasses   . Chronic anticoagulation    Coumadin therapy for mechanical aortic valve. Postoperative atrial fibrillation.   . Essential hypertension   . H/O mechanical aortic valve replacement 2000   St Jude AVR  . Hypothyroidism   . Premature ventricular contractions     Past Surgical History:  Procedure Laterality Date  . AORTIC VALVE REPLACEMENT (AVR)/CORONARY ARTERY BYPASS GRAFTING (CABG)  2000    Home Medications:  Prior to Admission medications   Medication Sig Start Date End Date Taking? Authorizing  Provider  amLODipine (NORVASC) 5 MG tablet Take 5 mg by mouth daily before breakfast.    Yes [provider]  amoxicillin-clavulanate (AUGMENTIN) 500-125 MG tablet Take 1 tablet by mouth 2 (two) times daily. 01/01/18  Yes [provider]  aspirin EC 81 MG tablet Take 81 mg by mouth daily before breakfast.    Yes [provider]  cholecalciferol (VITAMIN D) 1000 UNITS tablet Take 2,000 Units by mouth daily.    Yes [provider]  COUMADIN 5 MG tablet Take 5-7.5 mg by mouth See admin instructions. 5 mg every Monday and Friday and 7.5 mg on all other days 09/18/13  Yes [provider]  finasteride (PROSCAR) 5 MG tablet Take 5 mg by mouth daily. 10/07/17  Yes [provider]  Iron TABS Take 1 tablet by mouth daily. Santana not sure of dosage    Yes [provider]  levothyroxine (SYNTHROID, LEVOTHROID) 75 MCG tablet Take 75 mcg by mouth daily before breakfast.  10/06/13  Yes [provider]  metoprolol tartrate (LOPRESSOR) 50 MG tablet Take 1 tablet (50 mg total) by mouth 2 (two) times daily. 05/29/17  Yes Donne Hazel, MD  Multiple Vitamins-Minerals (PRESERVISION AREDS PO) Take 1 tablet by mouth 2 (two) times daily.   Yes [provider]  rosuvastatin (CRESTOR) 20 MG tablet Take 20 mg by mouth daily. 3 times  a week   Yes [provider]  saccharomyces boulardii (FLORASTOR) 250 MG capsule Take 1 capsule (250 mg total) by mouth 2 (two) times daily. 05/29/17  Yes Donne Hazel, MD  tamsulosin (FLOMAX) 0.4 MG CAPS capsule Take 0.4 mg by mouth daily. 10/07/17  Yes [provider]  cefpodoxime (VANTIN) 200 MG tablet Take 1 tablet (200 mg total) by mouth 2 (two) times daily. Patient not taking: Reported on 01/03/2018 05/29/17   Donne Hazel, MD  ondansetron (ZOFRAN) 4 MG tablet Take 1 tablet (4 mg total) by mouth every 6 (six) hours as needed for nausea. Patient not taking: Reported on 01/03/2018 05/29/17   Donne Hazel,  MD    Inpatient Medications: Scheduled Meds:  Continuous Infusions:  PRN Meds:   Allergies:    Allergies  Allergen Reactions  . Vicodin [Hydrocodone-Acetaminophen]     Severe sensitivity  . Zocor [Simvastatin]     Short memory loss.  [name] [Olmesartan]     Abdominal cramping and increased stools/ irritable bowel  . Codeine Nausea And Vomiting  . Lopid [Gemfibrozil] Other (See Comments)    transaminitis  . Monopril [Fosinopril]     transaminitis  . Phenergan [Promethazine Hcl]     Severe somnolence.  . Prilosec [Omeprazole]     dyspepsia    Social History:   Social History   Socioeconomic History  . Marital status: Married    Spouse name: Not on file  . Number of children: Not on file  . Years of education: Not on file  . Highest education level: Not on file  Occupational History  . Not on file  Social Needs  . Financial resource strain: Not on file  . Food insecurity:    Worry: Not on file    Inability: Not on file  . Transportation needs:    Medical: Not on file    Non-medical: Not on file  Tobacco Use  . Smoking status: Former Smoker    Types: Cigarettes  . Smokeless tobacco: Never Used  . Tobacco comment: quit 57 years ago  Substance and Sexual Activity  . Alcohol use: No  . Drug use: No  . Sexual activity: Not on file  Lifestyle  . Physical activity:    Days per week: Not on file    Minutes per session: Not on file  . Stress: Not on file  Relationships  . Social connections:    Talks on phone: Not on file    Gets together: Not on file    Attends religious service: Not on file    Active member of club or organization: Not on file    Attends meetings of clubs or organizations: Not on file    Relationship status: Not on file  . Intimate partner violence:    Fear of current or ex partner: Not on file    Emotionally abused: Not on file    Physically abused: Not on file    Forced sexual activity: Not on file  Other Topics Concern  . Not  on file  Social History Narrative  . Not on file    Family History:    Family History  Problem Relation Age of Onset  . Hypertension Mother   . Heart attack Mother   . Hypertension Father      ROS:  Please see Jason history of present illness.  All other ROS reviewed and negative.     Physical Exam/Data:   Vitals:   01/03/18 0800  01/03/18 0900 01/03/18 0915 01/03/18 0953  BP: 128/69 128/60 (!) 127/55 (!) 148/59  Pulse: 80 81 80 91  Resp: 20 (!) 21 (!) 22   Temp:    98.6 F (37 C)  TempSrc:    Oral  SpO2: 100% 100% 100% 100%    Intake/Output Summary (Last 24 hours) at 01/03/2018 0958 Last data filed at 01/03/2018 2703 Gross per 24 hour  Intake 1445 ml  Output -  Net 1445 ml   There were no vitals filed for this visit. There is no height or weight on file to calculate BMI.  General:  Well nourished, well developed, in no acute distress HEENT: normal Lymph: no adenopathy Neck: no JVD Endocrine:  No thryomegaly Vascular: No carotid bruits; FA pulses 2+ bilaterally without bruits  Cardiac:  normal S1, S2; RRR; no murmur , crisp valve sounds Lungs:  clear to auscultation bilaterally, no wheezing, rhonchi or rales  Abd: soft, nontender, no hepatomegaly  Ext: no edema Musculoskeletal:  No deformities, BUE and BLE strength normal and equal Skin: warm and dry  Neuro:  CNs 2-12 intact, no focal abnormalities noted Psych:  Normal affect   EKG:  Jason EKG was personally reviewed and demonstrates:  NSR LAFB, NSST changes  Relevant CV Studies:   Laboratory Data:  Chemistry Recent Labs  Lab 01/03/18 0228  NA 139  K 4.1  CL 111  CO2 21*  GLUCOSE 159*  BUN 49*  CREATININE 1.03  CALCIUM 7.9*  GFRNONAA >60  GFRAA >60  ANIONGAP 7    Recent Labs  Lab 01/03/18 0228  PROT 5.5*  ALBUMIN 2.9*  AST 36  ALT 22  ALKPHOS 34*  BILITOT 0.7   Hematology Recent Labs  Lab 01/03/18 0228  WBC 14.0*  RBC 1.98*  HGB 6.8*  HCT 20.7*  MCV 104.5*  MCH 34.3*  MCHC 32.9    RDW 13.8  PLT 170   Cardiac EnzymesNo results for input(s): TROPONINI in Jason last 168 hours.  Recent Labs  Lab 01/03/18 0234  TROPIPOC 0.00    BNPNo results for input(s): BNP, PROBNP in Jason last 168 hours.  DDimer No results for input(s): DDIMER in Jason last 168 hours.  Radiology/Studies:  Ct Abdomen Pelvis W Contrast  Result Date: 01/02/2018 CLINICAL DATA:  84 year old male with upper abdominal pain, severe nausea and weight loss. She has also had 2 episodes of bloody stools. EXAM: CT ABDOMEN AND PELVIS WITH CONTRAST TECHNIQUE: Multidetector CT imaging of Jason abdomen and pelvis was performed using Jason standard protocol following bolus administration of intravenous contrast. CONTRAST:  161mL ISOVUE-300 IOPAMIDOL (ISOVUE-300) INJECTION 61% COMPARISON:  Prior CT scan of Jason abdomen and pelvis 05/26/2017 FINDINGS: Lower chest: Paraseptal pulmonary emphysema. Incompletely imaged prosthetic aortic valve. Jason heart is normal in size. No pericardial effusion. Unremarkable visualized distal thoracic esophagus. Hepatobiliary: No focal liver abnormality is seen. Status post cholecystectomy. No biliary dilatation. Pancreas: Unremarkable. No pancreatic ductal dilatation or surrounding inflammatory changes. Spleen: Normal in size without focal abnormality. Adrenals/Urinary Tract: Normal adrenal glands. No enhancing renal lesion, hydronephrosis or nephrolithiasis. Circumscribed low-attenuation lesions present bilaterally. Jason largest measures 1.6 cm and is consistent with a simple cyst. Jason smaller lesions are too small to characterize but also statistically highly likely benign cysts. Stomach/Bowel: Colonic diverticular disease without CT evidence of active inflammation. No evidence of obstruction or focal bowel wall thickening. Normal appendix in Jason right lower quadrant. Jason terminal ileum is unremarkable. Vascular/Lymphatic: Atherosclerotic calcifications present throughout Jason abdominal aorta. Probable  moderate  focal stenosis of Jason origin of Jason superior mesenteric artery secondary to calcified atherosclerotic plaque. Jason celiac artery is widely patent. Jason IMA remains patent. No aneurysm or dissection. Reproductive: Prostate is unremarkable. Other: No abdominal wall hernia or abnormality. No abdominopelvic ascites. Musculoskeletal: No acute fracture or aggressive appearing lytic or blastic osseous lesion. Incompletely imaged healed median sternotomy. IMPRESSION: 1. No acute abnormality within Jason abdomen or pelvis. 2. 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 Jason celiac artery remains widely patent as does Jason IMA. 3. Colonic diverticular disease without CT evidence of active inflammation. 4. Surgical changes of prior cholecystectomy. 5.  Aortic Atherosclerosis (ICD10-170.0) 6.  Emphysema (ICD10-J43.9). Electronically Signed   By: Jacqulynn Cadet M.D.   On: 01/02/2018 14:27    Assessment and Plan:   GI bleed  Syncope secondary to above  S/P CABG and St Jude AVR -2000  H/O PAF- DCCV 2017, holding NSR  Plan: Jason Santana is being transfused. Would not reverse Coumadin with Vit K if possible. We will follow. OK to stop ASA.    For questions or updates, please contact Ashley Please consult [email or web address] for contact info under Cardiology/STEMI.   Signed, Kerin Ransom, PA-C  01/03/2018 9:58 AM   Personally seen and examined. Agree with above.  84 year old male known well to Dr. Tamala Julian with [facility] Jude mechanical aortic valve status post CABG in 2000 with no prior GI bleed history here with melena yesterday, hemoglobin of 6.9, weakness, syncopal episode.  Recent paroxysmal atrial fibrillation in December 2017 status post cardioversion and maintaining sinus rhythm.  Latest echocardiogram demonstrated a fibrin-like string-like structure on Jason mechanical aortic valve and aspirin was started at that time.  I think a  repeat echocardiogram would be reasonable to evaluate, I will order.  Currently Jason Santana is sitting on Jason edge of Jason bed with his wife in Jason room.  Pleasant.  Appears somewhat pale.  Lungs are clear, sharp mechanical S2 heard on exam.  Belly nontender.  Labs: Hemoglobin 6.9.  Telemetry personally reviewed shows sinus rhythm heart rate in Jason 90s.  Assessment and plan:  GI bleed in Jason setting of mechanical aortic valve with anticoagulation, Coumadin - A few days ago at Dr. Inda Merlin office, his primary care physician, his INR was 2.8.  Doses have been adjusted based upon antibiotic use, recently for diverticulitis presumed.  Currently 3.7 on this admission.  Obviously holding Coumadin.  FOBT was positive.  2 units of blood.  Continue to monitor hemoglobin.  If necessary, can use FFP if active bleeding is suspected.  Jason Santana has not had a melanotic stool since yesterday.  GI consult.  - When ready to resume anticoagulation, I would be comfortable with starting Coumadin only with no bridge given his aortic valve.  We can keep him without anticoagulation for up to 7 days according to [facility] of cardiology guidelines when mechanical valves are in Jason aortic position.  No evidence of atrial fibrillation. No evidence of angina. Syncope a result of GI bleed/decreased hemoglobin, weakness.  Continue to monitor on telemetry for any other adverse arrhythmias.  Candee Furbish, MD

## 2018-01-03 NOTE — Progress Notes (Signed)
  Echocardiogram 2D Echocardiogram has been performed.  Jannett Celestine 01/03/2018, 12:40 PM

## 2018-01-03 NOTE — ED Provider Notes (Signed)
[facility] DEPT Provider Note   CSN: [id number] Arrival date & time: 01/03/18  0022     History   Chief Complaint Chief Complaint  Patient presents with  . Dizziness    HPI RAEQWON LUX is a 84 y.o. male.  Patient here for evaluation of lightheadedness and near syncope that is positional that has been progressive x 1 week. He had a full syncopal episode tonight per wife. He has had abdominal pain through the week and was seen by  His primary care physician who ordered a CT of his abdomen for evaluation of possible diverticulitis. He reports seeing BRB blood in his stool and that his stool in dark in color. He has had nausea without vomiting. No fever. No history of GI bleeding in the past, and he denies ever having a colonoscopy. The patient describes lightheadedness when sitting up or standing, but is completely asymptomatic when lying down. He reports an 8 pound weight loss this week since symptoms began. He has a history of atrial fibrillation, heart valve replacement, CAD, HTN. He is chronically anticoagulated with Coumadin.  The history is provided by the patient and the spouse. No language interpreter was used.  Dizziness  Associated symptoms: blood in stool, nausea and weakness   Associated symptoms: no vomiting     Past Medical History:  Diagnosis Date  . Atrial fibrillation ([place]) 11/25/2013  . CAD (coronary artery disease) of artery bypass graft 11/25/2013   2000 with multiple bypasses   . Chronic anticoagulation 11/25/2013   Coumadin therapy for mechanical aortic valve. Postoperative atrial fibrillation.   . Essential hypertension 11/25/2013  . H/O mechanical aortic valve replacement 11/25/2013   St. Jude bowel prosthesis 2000   . Premature ventricular contractions 11/25/2013    Patient Active Problem List   Diagnosis Date Noted  . Macular degeneration of left eye 05/26/2017  . Hypothyroidism 05/26/2017  . Acute lower UTI 05/25/2017  .  Hyponatremia 05/25/2017  . Sepsis ([place]) 05/25/2017  . Muscle weakness 12/13/2014  . Paroxysmal atrial flutter ([place]) 11/25/2013  . Essential hypertension 11/25/2013  . H/O mechanical aortic valve replacement 11/25/2013  . CAD (coronary artery disease) of artery bypass graft 11/25/2013  . Chronic anticoagulation 11/25/2013  . Premature ventricular contractions 11/25/2013    No past surgical history on file.      Home Medications    Prior to Admission medications   Medication Sig Start Date End Date Taking? Authorizing Provider  amLODipine (NORVASC) 5 MG tablet Take 5 mg by mouth daily before breakfast.     [provider]  amoxicillin-clavulanate (AUGMENTIN) 500-125 MG tablet Take 1 tablet by mouth 2 (two) times daily. 01/01/18   [provider]  aspirin EC 81 MG tablet Take 81 mg by mouth daily before breakfast.     [provider]  cefpodoxime (VANTIN) 200 MG tablet Take 1 tablet (200 mg total) by mouth 2 (two) times daily. 05/29/17   Donne Hazel, MD  cholecalciferol (VITAMIN D) 1000 UNITS tablet Take 2,000 Units by mouth daily.     [provider]  COUMADIN 5 MG tablet Take 5-7.5 mg by mouth See admin instructions. 5 mg every Sunday and Wednesday and 7.5 mg on all other days 09/18/13   [provider]  Iron TABS Take 1 tablet by mouth daily. Pt not sure of dosage     [provider]  levothyroxine (SYNTHROID, LEVOTHROID) 75 MCG tablet Take 75 mcg by mouth daily before breakfast.  10/06/13   [provider]  metoprolol tartrate (LOPRESSOR) 50 MG tablet Take 1 tablet (50 mg total) by mouth 2 (two) times daily. 05/29/17   Donne Hazel, MD  ondansetron (ZOFRAN) 4 MG tablet Take 1 tablet (4 mg total) by mouth every 6 (six) hours as needed for nausea. 05/29/17   Donne Hazel, MD  rosuvastatin (CRESTOR) 5 MG tablet Take 5 mg by mouth daily.    [provider]  saccharomyces boulardii (FLORASTOR) 250 MG capsule Take 1  capsule (250 mg total) by mouth 2 (two) times daily. 05/29/17   Donne Hazel, MD  tobramycin (TOBREX) 0.3 % ophthalmic solution Place 1 drop into the left eye 4 (four) times daily. 04/30/17   [provider]    Family History Family History  Problem Relation Age of Onset  . Hypertension Mother   . Heart attack Mother   . Hypertension Father     Social History Social History   Tobacco Use  . Smoking status: Former Smoker    Types: Cigarettes  . Smokeless tobacco: Never Used  . Tobacco comment: quit 57 years ago  Substance Use Topics  . Alcohol use: No  . Drug use: No     Allergies   Vicodin [hydrocodone-acetaminophen]; Zocor [simvastatin]; Benicar [olmesartan]; Codeine; Lopid [gemfibrozil]; Monopril [fosinopril]; Phenergan [promethazine hcl]; and Prilosec [omeprazole]   Review of Systems Review of Systems  Constitutional: Positive for unexpected weight change. Negative for chills and fever.  HENT: Negative.   Respiratory: Negative.   Cardiovascular: Negative.   Gastrointestinal: Positive for abdominal pain, blood in stool and nausea. Negative for vomiting.  Musculoskeletal: Negative.   Skin: Negative.   Neurological: Positive for dizziness, syncope and weakness.     Physical Exam Updated Vital Signs BP (!) 121/54   Pulse 77   Temp 98 F (36.7 C) (Oral)   Resp (!) 21   SpO2 99%   Physical Exam  Constitutional: He is oriented to person, place, and time. He appears well-developed and well-nourished.  HENT:  Head: Normocephalic.  Eyes:  Conjunctival pallor.  Neck: Normal range of motion. Neck supple.  Cardiovascular: Normal rate and regular rhythm.  No murmur heard. Pulmonary/Chest: Effort normal and breath sounds normal. He has no wheezes. He has no rales. He exhibits no tenderness.  Abdominal: Soft. Bowel sounds are normal. There is tenderness (diffuse tenderness, mild.). There is no rebound and no guarding.  Musculoskeletal: Normal range of  motion.  Neurological: He is alert and oriented to person, place, and time.  Skin: Skin is warm and dry. No rash noted.  Psychiatric: He has a normal mood and affect.     ED Treatments / Results  Labs (all labs ordered are listed, but only abnormal results are displayed) Labs Reviewed  CBC WITH DIFFERENTIAL/PLATELET - Abnormal; Notable for the following components:      Result Value   WBC 14.0 (*)    RBC 1.98 (*)    Hemoglobin 6.8 (*)    HCT 20.7 (*)    MCV 104.5 (*)    MCH 34.3 (*)    Neutro Abs 10.8 (*)    Monocytes Absolute 1.2 (*)    All other components within normal limits  COMPREHENSIVE METABOLIC PANEL - Abnormal; Notable for the following components:   CO2 21 (*)    Glucose, Bld 159 (*)    BUN 49 (*)    Calcium 7.9 (*)    Total Protein 5.5 (*)    Albumin 2.9 (*)  Alkaline Phosphatase 34 (*)    All other components within normal limits  PROTIME-INR  APTT  I-STAT TROPONIN, ED  POC OCCULT BLOOD, ED  TYPE AND SCREEN  RED CROSS HLA ABC TYPING  PREPARE RBC (CROSSMATCH)    EKG None  Radiology Ct Abdomen Pelvis W Contrast  Result Date: 01/02/2018 CLINICAL DATA:  84 year old male with upper abdominal pain, severe nausea and weight loss. She has also had 2 episodes of bloody stools. EXAM: CT ABDOMEN AND PELVIS WITH CONTRAST TECHNIQUE: Multidetector CT imaging of the abdomen and pelvis was performed using the standard protocol following bolus administration of intravenous contrast. CONTRAST:  154m ISOVUE-300 IOPAMIDOL (ISOVUE-300) INJECTION 61% COMPARISON:  Prior CT scan of the abdomen and pelvis 05/26/2017 FINDINGS: Lower chest: Paraseptal pulmonary emphysema. Incompletely imaged prosthetic aortic valve. The heart is normal in size. No pericardial effusion. Unremarkable visualized distal thoracic esophagus. Hepatobiliary: No focal liver abnormality is seen. Status post cholecystectomy. No biliary dilatation. Pancreas: Unremarkable. No pancreatic ductal dilatation or  surrounding inflammatory changes. Spleen: Normal in size without focal abnormality. Adrenals/Urinary Tract: Normal adrenal glands. No enhancing renal lesion, hydronephrosis or nephrolithiasis. Circumscribed low-attenuation lesions present bilaterally. The largest measures 1.6 cm and is consistent with a simple cyst. The smaller lesions are too small to characterize but also statistically highly likely benign cysts. Stomach/Bowel: Colonic diverticular disease without CT evidence of active inflammation. No evidence of obstruction or focal bowel wall thickening. Normal appendix in the right lower quadrant. The terminal ileum is unremarkable. Vascular/Lymphatic: Atherosclerotic calcifications present throughout the abdominal aorta. Probable moderate focal stenosis of the origin of the superior mesenteric artery secondary to calcified atherosclerotic plaque. The celiac artery is widely patent. The IMA remains patent. No aneurysm or dissection. Reproductive: Prostate is unremarkable. Other: No abdominal wall hernia or abnormality. No abdominopelvic ascites. Musculoskeletal: No acute fracture or aggressive appearing lytic or blastic osseous lesion. Incompletely imaged healed median sternotomy. IMPRESSION: 1. No acute abnormality within the abdomen or pelvis. 2. Calcified atherosclerotic plaque likely results in at least moderate stenosis of the origin of the superior mesenteric artery. The celiac artery remains widely patent as does the IMA. 3. Colonic diverticular disease without CT evidence of active inflammation. 4. Surgical changes of prior cholecystectomy. 5.  Aortic Atherosclerosis (ICD10-170.0) 6.  Emphysema (ICD10-J43.9). Electronically Signed   By: HJacqulynn CadetM.D.   On: 01/02/2018 14:27    Procedures Procedures (including critical care time) CRITICAL CARE Performed by: SDewaine Oats  Total critical care time: 45 minutes  Critical care time was exclusive of separately billable procedures and  treating other patients.  Critical care was necessary to treat or prevent imminent or life-threatening deterioration.  Critical care was time spent personally by me on the following activities: development of treatment plan with patient and/or surrogate as well as nursing, discussions with consultants, evaluation of patient's response to treatment, examination of patient, obtaining history from patient or surrogate, ordering and performing treatments and interventions, ordering and review of laboratory studies, ordering and review of radiographic studies, pulse oximetry and re-evaluation of patient's condition.  Medications Ordered in ED Medications - No data to display   Initial Impression / Assessment and Plan / ED Course  I have reviewed the triage vital signs and the nursing notes.  Pertinent labs & imaging results that were available during my care of the patient were reviewed by me and considered in my medical decision making (see chart for details).     The patient presents with lightheadedness and near  syncope x 1 week with full syncope today. Symptoms are positional where patient is asymptomatic when lying down. No fever, vomiting.   CT scan performed outpatient yesterday reviewed and shows no abdominal abnormalities: IMPRESSION: 1. No acute abnormality within the abdomen or pelvis. 2. Calcified atherosclerotic plaque likely results in at least moderate stenosis of the origin of the superior mesenteric artery. The celiac artery remains widely patent as does the IMA. 3. Colonic diverticular disease without CT evidence of active inflammation. 4. Surgical changes of prior cholecystectomy. 5.  Aortic Atherosclerosis (ICD10-170.0) 6.  Emphysema (ICD10-J43.9).  Patient is found to be significantly anemic with hgb 6.8. Hemoccult positive. Blood transfusion ordered and 2 units running in ED. Patient remains hemodynamically stable.   Discussed with Dr. Hurley Cisco for admission. GI  consultation requested, discussed with Dr. Henrene Pastor who will see the patient in the hospital.   Patient admitted to Dr. Darol Destine service.   Final Clinical Impressions(s) / ED Diagnoses   Final diagnoses:  None   1. Lower GI bleeding 2. Symptomatic anemia 3. Syncope 4. Coagulopathy   ED Discharge Orders    None       Charlann Lange, PA-C 01/03/18 0549    Daleen Bo, MD 01/03/18 [id number]

## 2018-01-03 NOTE — Consult Note (Signed)
Referring Provider: [facility] Primary Care Physician:  [name], MD Primary Gastroenterologist:  Sadie [facility] primary  Reason for Consultation:  GI bleed  HPI: Jason Santana is a 84 y.o. male with past medical history of aortic valve replacement currently on chronic anticoagulation with Coumadin admitted to the hospital for further evaluation of weakness and syncope. He was found to have anemia with hemoglobin of 6.8. he was complaining of blood in the stool. GI is consulted for further evaluation.  Patient seen and examined at bedside. Family at bedside.Patient started noticing black color stool. 5-6 days ago. Associated with nausea and central abdominal pain. Denied any vomiting. He denied any bright red blood per rectum. Multiple episodes of black tarry stool in last few days. Last episode yesterday. No bowel movement today. He was complaining of fatigue and weakness. Had a syncopal episode.  Not sure of any previous EGD. Last colonoscopy more than 10 years ago. No family history of colon cancer. Denied any NSAID use. Denied dysphagia or odynophagia. Denied acid reflux. Past Medical History:  Diagnosis Date  . Atrial fibrillation ([place])   . CAD (coronary artery disease) of artery bypass graft 2000   2000 with multiple bypasses   . Chronic anticoagulation    Coumadin therapy for mechanical aortic valve. Postoperative atrial fibrillation.   . Essential hypertension   . H/O mechanical aortic valve replacement 2000   St Jude AVR  . Hypothyroidism   . Premature ventricular contractions     Past Surgical History:  Procedure Laterality Date  . AORTIC VALVE REPLACEMENT (AVR)/CORONARY ARTERY BYPASS GRAFTING (CABG)  2000    Prior to Admission medications   Medication Sig Start Date End Date Taking? Authorizing Provider  amLODipine (NORVASC) 5 MG tablet Take 5 mg by mouth daily before breakfast.    Yes [provider]  amoxicillin-clavulanate (AUGMENTIN) 500-125 MG tablet Take 1 tablet by  mouth 2 (two) times daily. 01/01/18  Yes [provider]  aspirin EC 81 MG tablet Take 81 mg by mouth daily before breakfast.    Yes [provider]  cholecalciferol (VITAMIN D) 1000 UNITS tablet Take 2,000 Units by mouth daily.    Yes [provider]  COUMADIN 5 MG tablet Take 5-7.5 mg by mouth See admin instructions. 5 mg every Monday and Friday and 7.5 mg on all other days 09/18/13  Yes [provider]  finasteride (PROSCAR) 5 MG tablet Take 5 mg by mouth daily. 10/07/17  Yes [provider]  Iron TABS Take 1 tablet by mouth daily. Pt not sure of dosage    Yes [provider]  levothyroxine (SYNTHROID, LEVOTHROID) 75 MCG tablet Take 75 mcg by mouth daily before breakfast.  10/06/13  Yes [provider]  metoprolol tartrate (LOPRESSOR) 50 MG tablet Take 1 tablet (50 mg total) by mouth 2 (two) times daily. 05/29/17  Yes Donne Hazel, MD  Multiple Vitamins-Minerals (PRESERVISION AREDS PO) Take 1 tablet by mouth 2 (two) times daily.   Yes [provider]  rosuvastatin (CRESTOR) 20 MG tablet Take 20 mg by mouth daily. 3 times a week   Yes [provider]  saccharomyces boulardii (FLORASTOR) 250 MG capsule Take 1 capsule (250 mg total) by mouth 2 (two) times daily. 05/29/17  Yes Donne Hazel, MD  tamsulosin (FLOMAX) 0.4 MG CAPS capsule Take 0.4 mg by mouth daily. 10/07/17  Yes [provider]  cefpodoxime (VANTIN) 200 MG tablet Take 1 tablet (200 mg total) by mouth 2 (two) times daily.  Patient not taking: Reported on 01/03/2018 05/29/17   Donne Hazel, MD  ondansetron (ZOFRAN) 4 MG tablet Take 1 tablet (4 mg total) by mouth every 6 (six) hours as needed for nausea. Patient not taking: Reported on 01/03/2018 05/29/17   Donne Hazel, MD    Scheduled Meds: . cholecalciferol  2,000 Units Oral Daily  . finasteride  5 mg Oral Daily  . [START ON 01/04/2018] levothyroxine  75 mcg Oral QAC breakfast  . metoprolol tartrate   50 mg Oral BID  . rosuvastatin  20 mg Oral Q M,W,F  . saccharomyces boulardii  250 mg Oral BID  . sodium chloride flush  3 mL Intravenous Q12H  . tamsulosin  0.4 mg Oral Daily   Continuous Infusions: . sodium chloride    . famotidine (PEPCID) IV     PRN Meds:.sodium chloride, acetaminophen **OR** acetaminophen, morphine injection, ondansetron **OR** ondansetron (ZOFRAN) IV, sodium chloride flush  Allergies as of 01/03/2018 - Review Complete 01/03/2018  Allergen Reaction Noted  . Vicodin [hydrocodone-acetaminophen]  02/25/2014  . Zocor [simvastatin]  02/25/2014  . Benicar [olmesartan]  02/25/2014  . Codeine Nausea And Vomiting 11/25/2013  . Lopid [gemfibrozil] Other (See Comments) 02/25/2014  . Monopril [fosinopril]  02/25/2014  . Phenergan [promethazine hcl]  02/25/2014  . Prilosec [omeprazole]  02/25/2014    Family History  Problem Relation Age of Onset  . Hypertension Mother   . Heart attack Mother   . Hypertension Father     Social History   Socioeconomic History  . Marital status: Married    Spouse name: Not on file  . Number of children: Not on file  . Years of education: Not on file  . Highest education level: Not on file  Occupational History  . Not on file  Social Needs  . Financial resource strain: Not on file  . Food insecurity:    Worry: Not on file    Inability: Not on file  . Transportation needs:    Medical: Not on file    Non-medical: Not on file  Tobacco Use  . Smoking status: Former Smoker    Types: Cigarettes  . Smokeless tobacco: Never Used  . Tobacco comment: quit 57 years ago  Substance and Sexual Activity  . Alcohol use: No  . Drug use: No  . Sexual activity: Not on file  Lifestyle  . Physical activity:    Days per week: Not on file    Minutes per session: Not on file  . Stress: Not on file  Relationships  . Social connections:    Talks on phone: Not on file    Gets together: Not on file    Attends religious service: Not on file     Active member of club or organization: Not on file    Attends meetings of clubs or organizations: Not on file    Relationship status: Not on file  . Intimate partner violence:    Fear of current or ex partner: Not on file    Emotionally abused: Not on file    Physically abused: Not on file    Forced sexual activity: Not on file  Other Topics Concern  . Not on file  Social History Narrative  . Not on file    Review of Systems: .Review of Systems  Constitutional: Positive for malaise/fatigue. Negative for chills and fever.  HENT: Negative for hearing loss and tinnitus.   Eyes: Negative for blurred vision and double vision.  Respiratory: Positive for shortness  of breath. Negative for cough and hemoptysis.   Cardiovascular: Negative for chest pain and palpitations.  Gastrointestinal: Positive for abdominal pain, blood in stool and melena. Negative for constipation, diarrhea, heartburn, nausea and vomiting.  Genitourinary: Positive for dysuria. Negative for urgency.  Musculoskeletal: Negative for myalgias and neck pain.  Skin: Negative for itching and rash.  Neurological: Positive for dizziness. Negative for speech change.  Endo/Heme/Allergies: Does not bruise/bleed easily.  Psychiatric/Behavioral: Negative for hallucinations and suicidal ideas.    Physical Exam: Vital signs: Vitals:   01/03/18 1302 01/03/18 1304  BP: (!) 148/62 (!) 148/62  Pulse: 90 94  Resp: 16   Temp: 98.7 F (37.1 C) 98.7 F (37.1 C)  SpO2:       Physical Exam  Constitutional: He is oriented to person, place, and time. He appears well-developed and well-nourished. No distress.  HENT:  Head: Normocephalic and atraumatic.  Mouth/Throat: Oropharynx is clear and moist. No oropharyngeal exudate.  Eyes: EOM are normal. No scleral icterus.  Neck: Normal range of motion. Neck supple.  Cardiovascular: Normal rate and regular rhythm.  Murmur heard. Pulmonary/Chest: Effort normal and breath sounds normal.  No respiratory distress.  Abdominal: Soft. Bowel sounds are normal. He exhibits no distension. There is no tenderness. There is no rebound and no guarding.  Musculoskeletal: Normal range of motion. He exhibits no edema.  Neurological: He is alert and oriented to person, place, and time.  Skin: Skin is warm. No erythema.  Psychiatric: He has a normal mood and affect. Judgment normal.  Vitals reviewed.  GI:  Lab Results: Recent Labs    01/03/18 0228  WBC 14.0*  HGB 6.8*  HCT 20.7*  PLT 170   BMET Recent Labs    01/03/18 0228  NA 139  K 4.1  CL 111  CO2 21*  GLUCOSE 159*  BUN 49*  CREATININE 1.03  CALCIUM 7.9*   LFT Recent Labs    01/03/18 0228  PROT 5.5*  ALBUMIN 2.9*  AST 36  ALT 22  ALKPHOS 34*  BILITOT 0.7   PT/INR Recent Labs    01/03/18 0315  LABPROT 36.2*  INR 3.67     Studies/Results: Ct Abdomen Pelvis W Contrast  Result Date: 01/02/2018 CLINICAL DATA:  84 year old male with upper abdominal pain, severe nausea and weight loss. She has also had 2 episodes of bloody stools. EXAM: CT ABDOMEN AND PELVIS WITH CONTRAST TECHNIQUE: Multidetector CT imaging of the abdomen and pelvis was performed using the standard protocol following bolus administration of intravenous contrast. CONTRAST:  135mL ISOVUE-300 IOPAMIDOL (ISOVUE-300) INJECTION 61% COMPARISON:  Prior CT scan of the abdomen and pelvis 05/26/2017 FINDINGS: Lower chest: Paraseptal pulmonary emphysema. Incompletely imaged prosthetic aortic valve. The heart is normal in size. No pericardial effusion. Unremarkable visualized distal thoracic esophagus. Hepatobiliary: No focal liver abnormality is seen. Status post cholecystectomy. No biliary dilatation. Pancreas: Unremarkable. No pancreatic ductal dilatation or surrounding inflammatory changes. Spleen: Normal in size without focal abnormality. Adrenals/Urinary Tract: Normal adrenal glands. No enhancing renal lesion, hydronephrosis or nephrolithiasis. Circumscribed  low-attenuation lesions present bilaterally. The largest measures 1.6 cm and is consistent with a simple cyst. The smaller lesions are too small to characterize but also statistically highly likely benign cysts. Stomach/Bowel: Colonic diverticular disease without CT evidence of active inflammation. No evidence of obstruction or focal bowel wall thickening. Normal appendix in the right lower quadrant. The terminal ileum is unremarkable. Vascular/Lymphatic: Atherosclerotic calcifications present throughout the abdominal aorta. Probable moderate focal stenosis of the origin of the superior  mesenteric artery secondary to calcified atherosclerotic plaque. The celiac artery is widely patent. The IMA remains patent. No aneurysm or dissection. Reproductive: Prostate is unremarkable. Other: No abdominal wall hernia or abnormality. No abdominopelvic ascites. Musculoskeletal: No acute fracture or aggressive appearing lytic or blastic osseous lesion. Incompletely imaged healed median sternotomy. IMPRESSION: 1. No acute abnormality within the abdomen or pelvis. 2. Calcified atherosclerotic plaque likely results in at least moderate stenosis of the origin of the superior mesenteric artery. The celiac artery remains widely patent as does the IMA. 3. Colonic diverticular disease without CT evidence of active inflammation. 4. Surgical changes of prior cholecystectomy. 5.  Aortic Atherosclerosis (ICD10-170.0) 6.  Emphysema (ICD10-J43.9). Electronically Signed   By: Jacqulynn Cadet M.D.   On: 01/02/2018 14:27    Impression/Plan: - melena. Patient is complaining of black color stool for last few days. CT scan negative for any acute changes. - acute blood loss anemia - Centralized abdominal pain. Patient was treated with presumed diverticulitis. CT scan showed no evidence of diverticulitis. - Aortic valve replacement. On chronic anticoagulation. Coumadin on hold  Recommendations -------------------------- - Transfuse as  needed to keep hemoglobin around 7-8. - start Protonix 40 mg twice a day. IV Protonix is in the back order. Restart oral PPI. Patient with previous intolerance to omeprazole but I think it is reasonable to try a different PPI given history of melena. - EGD once INR is less than 2. If EGD negative and if there is any evidence of ongoing bleeding or drop in hemoglobin, may need colonoscopy for further evaluation. - Okay to have full liquid diet for now. GI will follow    LOS: 0 days   Otis Brace  MD, FACP 01/03/2018, 2:08 PM  Contact #  [phone]

## 2018-01-03 NOTE — H&P (View-Only) (Signed)
Referring Provider: [place] Primary Care Physician:  [name], MD Primary Gastroenterologist:  [facility] primary  Reason for Consultation:  GI bleed  HPI: Jason Santana is a 84 y.o. male with past medical history of aortic valve replacement currently on chronic anticoagulation with Coumadin admitted to the hospital for further evaluation of weakness and syncope. He was found to have anemia with hemoglobin of 6.8. he was complaining of blood in the stool. GI is consulted for further evaluation.  Patient seen and examined at bedside. Family at bedside.Patient started noticing black color stool. 5-6 days ago. Associated with nausea and central abdominal pain. Denied any vomiting. He denied any bright red blood per rectum. Multiple episodes of black tarry stool in last few days. Last episode yesterday. No bowel movement today. He was complaining of fatigue and weakness. Had a syncopal episode.  Not sure of any previous EGD. Last colonoscopy more than 10 years ago. No family history of colon cancer. Denied any NSAID use. Denied dysphagia or odynophagia. Denied acid reflux. Past Medical History:  Diagnosis Date  . Atrial fibrillation ([place])   . CAD (coronary artery disease) of artery bypass graft 2000   2000 with multiple bypasses   . Chronic anticoagulation    Coumadin therapy for mechanical aortic valve. Postoperative atrial fibrillation.   . Essential hypertension   . H/O mechanical aortic valve replacement 2000   St Jude AVR  . Hypothyroidism   . Premature ventricular contractions     Past Surgical History:  Procedure Laterality Date  . AORTIC VALVE REPLACEMENT (AVR)/CORONARY ARTERY BYPASS GRAFTING (CABG)  2000    Prior to Admission medications   Medication Sig Start Date End Date Taking? Authorizing Provider  amLODipine (NORVASC) 5 MG tablet Take 5 mg by mouth daily before breakfast.    Yes [provider]  amoxicillin-clavulanate (AUGMENTIN) 500-125 MG tablet Take 1 tablet by  mouth 2 (two) times daily. 01/01/18  Yes [provider]  aspirin EC 81 MG tablet Take 81 mg by mouth daily before breakfast.    Yes [provider]  cholecalciferol (VITAMIN D) 1000 UNITS tablet Take 2,000 Units by mouth daily.    Yes [provider]  COUMADIN 5 MG tablet Take 5-7.5 mg by mouth See admin instructions. 5 mg every Monday and Friday and 7.5 mg on all other days 09/18/13  Yes [provider]  finasteride (PROSCAR) 5 MG tablet Take 5 mg by mouth daily. 10/07/17  Yes [provider]  Iron TABS Take 1 tablet by mouth daily. Pt not sure of dosage    Yes [provider]  levothyroxine (SYNTHROID, LEVOTHROID) 75 MCG tablet Take 75 mcg by mouth daily before breakfast.  10/06/13  Yes [provider]  metoprolol tartrate (LOPRESSOR) 50 MG tablet Take 1 tablet (50 mg total) by mouth 2 (two) times daily. 05/29/17  Yes Donne Hazel, MD  Multiple Vitamins-Minerals (PRESERVISION AREDS PO) Take 1 tablet by mouth 2 (two) times daily.   Yes [provider]  rosuvastatin (CRESTOR) 20 MG tablet Take 20 mg by mouth daily. 3 times a week   Yes [provider]  saccharomyces boulardii (FLORASTOR) 250 MG capsule Take 1 capsule (250 mg total) by mouth 2 (two) times daily. 05/29/17  Yes Donne Hazel, MD  tamsulosin (FLOMAX) 0.4 MG CAPS capsule Take 0.4 mg by mouth daily. 10/07/17  Yes [provider]  cefpodoxime (VANTIN) 200 MG tablet Take 1 tablet (200 mg total) by mouth 2 (two) times daily.  Patient not taking: Reported on 01/03/2018 05/29/17   Donne Hazel, MD  ondansetron (ZOFRAN) 4 MG tablet Take 1 tablet (4 mg total) by mouth every 6 (six) hours as needed for nausea. Patient not taking: Reported on 01/03/2018 05/29/17   Donne Hazel, MD    Scheduled Meds: . cholecalciferol  2,000 Units Oral Daily  . finasteride  5 mg Oral Daily  . [START ON 01/04/2018] levothyroxine  75 mcg Oral QAC breakfast  . metoprolol tartrate   50 mg Oral BID  . rosuvastatin  20 mg Oral Q M,W,F  . saccharomyces boulardii  250 mg Oral BID  . sodium chloride flush  3 mL Intravenous Q12H  . tamsulosin  0.4 mg Oral Daily   Continuous Infusions: . sodium chloride    . famotidine (PEPCID) IV     PRN Meds:.sodium chloride, acetaminophen **OR** acetaminophen, morphine injection, ondansetron **OR** ondansetron (ZOFRAN) IV, sodium chloride flush  Allergies as of 01/03/2018 - Review Complete 01/03/2018  Allergen Reaction Noted  . Vicodin [hydrocodone-acetaminophen]  02/25/2014  . Zocor [simvastatin]  02/25/2014  . Benicar [olmesartan]  02/25/2014  . Codeine Nausea And Vomiting 11/25/2013  . Lopid [gemfibrozil] Other (See Comments) 02/25/2014  . Monopril [fosinopril]  02/25/2014  . Phenergan [promethazine hcl]  02/25/2014  . Prilosec [omeprazole]  02/25/2014    Family History  Problem Relation Age of Onset  . Hypertension Mother   . Heart attack Mother   . Hypertension Father     Social History   Socioeconomic History  . Marital status: Married    Spouse name: Not on file  . Number of children: Not on file  . Years of education: Not on file  . Highest education level: Not on file  Occupational History  . Not on file  Social Needs  . Financial resource strain: Not on file  . Food insecurity:    Worry: Not on file    Inability: Not on file  . Transportation needs:    Medical: Not on file    Non-medical: Not on file  Tobacco Use  . Smoking status: Former Smoker    Types: Cigarettes  . Smokeless tobacco: Never Used  . Tobacco comment: quit 57 years ago  Substance and Sexual Activity  . Alcohol use: No  . Drug use: No  . Sexual activity: Not on file  Lifestyle  . Physical activity:    Days per week: Not on file    Minutes per session: Not on file  . Stress: Not on file  Relationships  . Social connections:    Talks on phone: Not on file    Gets together: Not on file    Attends religious service: Not on file     Active member of club or organization: Not on file    Attends meetings of clubs or organizations: Not on file    Relationship status: Not on file  . Intimate partner violence:    Fear of current or ex partner: Not on file    Emotionally abused: Not on file    Physically abused: Not on file    Forced sexual activity: Not on file  Other Topics Concern  . Not on file  Social History Narrative  . Not on file    Review of Systems: .Review of Systems  Constitutional: Positive for malaise/fatigue. Negative for chills and fever.  HENT: Negative for hearing loss and tinnitus.   Eyes: Negative for blurred vision and double vision.  Respiratory: Positive for shortness  of breath. Negative for cough and hemoptysis.   Cardiovascular: Negative for chest pain and palpitations.  Gastrointestinal: Positive for abdominal pain, blood in stool and melena. Negative for constipation, diarrhea, heartburn, nausea and vomiting.  Genitourinary: Positive for dysuria. Negative for urgency.  Musculoskeletal: Negative for myalgias and neck pain.  Skin: Negative for itching and rash.  Neurological: Positive for dizziness. Negative for speech change.  Endo/Heme/Allergies: Does not bruise/bleed easily.  Psychiatric/Behavioral: Negative for hallucinations and suicidal ideas.    Physical Exam: Vital signs: Vitals:   01/03/18 1302 01/03/18 1304  BP: (!) 148/62 (!) 148/62  Pulse: 90 94  Resp: 16   Temp: 98.7 F (37.1 C) 98.7 F (37.1 C)  SpO2:       Physical Exam  Constitutional: He is oriented to person, place, and time. He appears well-developed and well-nourished. No distress.  HENT:  Head: Normocephalic and atraumatic.  Mouth/Throat: Oropharynx is clear and moist. No oropharyngeal exudate.  Eyes: EOM are normal. No scleral icterus.  Neck: Normal range of motion. Neck supple.  Cardiovascular: Normal rate and regular rhythm.  Murmur heard. Pulmonary/Chest: Effort normal and breath sounds normal.  No respiratory distress.  Abdominal: Soft. Bowel sounds are normal. He exhibits no distension. There is no tenderness. There is no rebound and no guarding.  Musculoskeletal: Normal range of motion. He exhibits no edema.  Neurological: He is alert and oriented to person, place, and time.  Skin: Skin is warm. No erythema.  Psychiatric: He has a normal mood and affect. Judgment normal.  Vitals reviewed.  GI:  Lab Results: Recent Labs    01/03/18 0228  WBC 14.0*  HGB 6.8*  HCT 20.7*  PLT 170   BMET Recent Labs    01/03/18 0228  NA 139  K 4.1  CL 111  CO2 21*  GLUCOSE 159*  BUN 49*  CREATININE 1.03  CALCIUM 7.9*   LFT Recent Labs    01/03/18 0228  PROT 5.5*  ALBUMIN 2.9*  AST 36  ALT 22  ALKPHOS 34*  BILITOT 0.7   PT/INR Recent Labs    01/03/18 0315  LABPROT 36.2*  INR 3.67     Studies/Results: Ct Abdomen Pelvis W Contrast  Result Date: 01/02/2018 CLINICAL DATA:  84 year old male with upper abdominal pain, severe nausea and weight loss. She has also had 2 episodes of bloody stools. EXAM: CT ABDOMEN AND PELVIS WITH CONTRAST TECHNIQUE: Multidetector CT imaging of the abdomen and pelvis was performed using the standard protocol following bolus administration of intravenous contrast. CONTRAST:  176mL ISOVUE-300 IOPAMIDOL (ISOVUE-300) INJECTION 61% COMPARISON:  Prior CT scan of the abdomen and pelvis 05/26/2017 FINDINGS: Lower chest: Paraseptal pulmonary emphysema. Incompletely imaged prosthetic aortic valve. The heart is normal in size. No pericardial effusion. Unremarkable visualized distal thoracic esophagus. Hepatobiliary: No focal liver abnormality is seen. Status post cholecystectomy. No biliary dilatation. Pancreas: Unremarkable. No pancreatic ductal dilatation or surrounding inflammatory changes. Spleen: Normal in size without focal abnormality. Adrenals/Urinary Tract: Normal adrenal glands. No enhancing renal lesion, hydronephrosis or nephrolithiasis. Circumscribed  low-attenuation lesions present bilaterally. The largest measures 1.6 cm and is consistent with a simple cyst. The smaller lesions are too small to characterize but also statistically highly likely benign cysts. Stomach/Bowel: Colonic diverticular disease without CT evidence of active inflammation. No evidence of obstruction or focal bowel wall thickening. Normal appendix in the right lower quadrant. The terminal ileum is unremarkable. Vascular/Lymphatic: Atherosclerotic calcifications present throughout the abdominal aorta. Probable moderate focal stenosis of the origin of the superior  mesenteric artery secondary to calcified atherosclerotic plaque. The celiac artery is widely patent. The IMA remains patent. No aneurysm or dissection. Reproductive: Prostate is unremarkable. Other: No abdominal wall hernia or abnormality. No abdominopelvic ascites. Musculoskeletal: No acute fracture or aggressive appearing lytic or blastic osseous lesion. Incompletely imaged healed median sternotomy. IMPRESSION: 1. No acute abnormality within the abdomen or pelvis. 2. Calcified atherosclerotic plaque likely results in at least moderate stenosis of the origin of the superior mesenteric artery. The celiac artery remains widely patent as does the IMA. 3. Colonic diverticular disease without CT evidence of active inflammation. 4. Surgical changes of prior cholecystectomy. 5.  Aortic Atherosclerosis (ICD10-170.0) 6.  Emphysema (ICD10-J43.9). Electronically Signed   By: Jacqulynn Cadet M.D.   On: 01/02/2018 14:27    Impression/Plan: - melena. Patient is complaining of black color stool for last few days. CT scan negative for any acute changes. - acute blood loss anemia - Centralized abdominal pain. Patient was treated with presumed diverticulitis. CT scan showed no evidence of diverticulitis. - Aortic valve replacement. On chronic anticoagulation. Coumadin on hold  Recommendations -------------------------- - Transfuse as  needed to keep hemoglobin around 7-8. - start Protonix 40 mg twice a day. IV Protonix is in the back order. Restart oral PPI. Patient with previous intolerance to omeprazole but I think it is reasonable to try a different PPI given history of melena. - EGD once INR is less than 2. If EGD negative and if there is any evidence of ongoing bleeding or drop in hemoglobin, may need colonoscopy for further evaluation. - Okay to have full liquid diet for now. GI will follow    LOS: 0 days   Otis Brace  MD, FACP 01/03/2018, 2:08 PM  Contact #  [phone]

## 2018-01-03 NOTE — ED Provider Notes (Signed)
  Face-to-face evaluation   History: He presents for evaluation of weakness, anorexia, and syncope.  Symptom onset several days ago.  Recent treatment for UTI followed by treatment for diverticulitis.  Physical exam: Alert elderly man who is comfortable and cooperative.  Heart regular rate and rhythm with systolic murmur, consistent with aortic valvular disease.  Lungs clear anteriorly.  Abdomen soft nontender to palpation.  Medical screening examination/treatment/procedure(s) were conducted as a shared visit with non-physician practitioner(s) and myself.  I personally evaluated the patient during the encounter     Daleen Bo, MD 01/03/18 [phone]

## 2018-01-04 DIAGNOSIS — D62 Acute posthemorrhagic anemia: Secondary | ICD-10-CM

## 2018-01-04 DIAGNOSIS — I10 Essential (primary) hypertension: Secondary | ICD-10-CM

## 2018-01-04 DIAGNOSIS — I25110 Atherosclerotic heart disease of native coronary artery with unstable angina pectoris: Secondary | ICD-10-CM

## 2018-01-04 DIAGNOSIS — R55 Syncope and collapse: Secondary | ICD-10-CM

## 2018-01-04 LAB — CBC WITH DIFFERENTIAL/PLATELET
BASOS ABS: 0.1 10*3/uL (ref 0.0–0.1)
Basophils Relative: 0 %
EOS ABS: 0.2 10*3/uL (ref 0.0–0.7)
EOS PCT: 1 %
HCT: 21.5 % — ABNORMAL LOW (ref 39.0–52.0)
Hemoglobin: 7.3 g/dL — ABNORMAL LOW (ref 13.0–17.0)
LYMPHS PCT: 15 %
Lymphs Abs: 2.5 10*3/uL (ref 0.7–4.0)
MCH: 32.6 pg (ref 26.0–34.0)
MCHC: 34 g/dL (ref 30.0–36.0)
MCV: 96 fL (ref 78.0–100.0)
Monocytes Absolute: 1.3 10*3/uL — ABNORMAL HIGH (ref 0.1–1.0)
Monocytes Relative: 8 %
Neutro Abs: 12.5 10*3/uL — ABNORMAL HIGH (ref 1.7–7.7)
Neutrophils Relative %: 76 %
PLATELETS: 160 10*3/uL (ref 150–400)
RBC: 2.24 MIL/uL — AB (ref 4.22–5.81)
RDW: 17.4 % — ABNORMAL HIGH (ref 11.5–15.5)
WBC: 16.6 10*3/uL — AB (ref 4.0–10.5)

## 2018-01-04 LAB — COMPREHENSIVE METABOLIC PANEL
ALK PHOS: 32 U/L — AB (ref 38–126)
ALT: 18 U/L (ref 17–63)
AST: 30 U/L (ref 15–41)
Albumin: 2.7 g/dL — ABNORMAL LOW (ref 3.5–5.0)
Anion gap: 9 (ref 5–15)
BUN: 45 mg/dL — AB (ref 6–20)
CALCIUM: 8.4 mg/dL — AB (ref 8.9–10.3)
CO2: 21 mmol/L — AB (ref 22–32)
CREATININE: 1.08 mg/dL (ref 0.61–1.24)
Chloride: 109 mmol/L (ref 101–111)
Glucose, Bld: 188 mg/dL — ABNORMAL HIGH (ref 65–99)
Potassium: 3.7 mmol/L (ref 3.5–5.1)
Sodium: 139 mmol/L (ref 135–145)
Total Bilirubin: 0.4 mg/dL (ref 0.3–1.2)
Total Protein: 4.8 g/dL — ABNORMAL LOW (ref 6.5–8.1)

## 2018-01-04 LAB — CBC
HCT: 20.9 % — ABNORMAL LOW (ref 39.0–52.0)
HCT: 19.1 % — ABNORMAL LOW (ref 39.0–52.0)
HEMOGLOBIN: 6.5 g/dL — AB (ref 13.0–17.0)
Hemoglobin: 7.1 g/dL — ABNORMAL LOW (ref 13.0–17.0)
MCH: 33.5 pg (ref 26.0–34.0)
MCH: 33.9 pg (ref 26.0–34.0)
MCHC: 34 g/dL (ref 30.0–36.0)
MCHC: 34 g/dL (ref 30.0–36.0)
MCV: 98.6 fL (ref 78.0–100.0)
MCV: 99.5 fL (ref 78.0–100.0)
PLATELETS: 157 10*3/uL (ref 150–400)
Platelets: 168 10*3/uL (ref 150–400)
RBC: 2.12 MIL/uL — ABNORMAL LOW (ref 4.22–5.81)
RBC: 1.92 MIL/uL — ABNORMAL LOW (ref 4.22–5.81)
RDW: 18.6 % — AB (ref 11.5–15.5)
RDW: 18.1 % — AB (ref 11.5–15.5)
WBC: 14.7 10*3/uL — AB (ref 4.0–10.5)
WBC: 17.9 10*3/uL — ABNORMAL HIGH (ref 4.0–10.5)

## 2018-01-04 LAB — PREPARE RBC (CROSSMATCH)

## 2018-01-04 LAB — PROTIME-INR
INR: 4.54
PROTHROMBIN TIME: 42.7 s — AB (ref 11.4–15.2)

## 2018-01-04 MED ORDER — SODIUM CHLORIDE 0.9 % IV SOLN
INTRAVENOUS | Status: AC
  Administered 2018-01-04: 09:00:00 via INTRAVENOUS

## 2018-01-04 MED ORDER — SODIUM CHLORIDE 0.9 % IV SOLN
Freq: Once | INTRAVENOUS | Status: AC
  Administered 2018-01-04: 16:00:00 via INTRAVENOUS

## 2018-01-04 MED ORDER — DIPHENHYDRAMINE HCL 12.5 MG/5ML PO ELIX
12.5000 mg | ORAL_SOLUTION | Freq: Every evening | ORAL | Status: DC | PRN
  Administered 2018-01-04 – 2018-01-06 (×3): 12.5 mg via ORAL
  Filled 2018-01-04 (×3): qty 5

## 2018-01-04 NOTE — Progress Notes (Signed)
Progress Note  Patient Name: Jason Santana Date of Encounter: 01/04/2018  Primary Cardiologist: Sinclair Grooms, MD   Subjective   Complains of dizziness  No CP   bReathing is OK  Mild abd discomfort  Inpatient Medications    Scheduled Meds: . cholecalciferol  2,000 Units Oral Daily  . finasteride  5 mg Oral Daily  . levothyroxine  75 mcg Oral QAC breakfast  . metoprolol tartrate  50 mg Oral BID  . pantoprazole  40 mg Oral BID  . rosuvastatin  20 mg Oral Q M,W,F  . saccharomyces boulardii  250 mg Oral BID  . sodium chloride flush  3 mL Intravenous Q12H  . tamsulosin  0.4 mg Oral Daily   Continuous Infusions: . sodium chloride     PRN Meds:  Vital Signs    Vitals:   01/03/18 1304 01/03/18 1557 01/03/18 2032 01/04/18 0604  BP: (!) 148/62 121/60 (!) 126/54 (!) 118/48  Pulse: 94 78 80 86  Resp:  18 16 18   Temp: 98.7 F (37.1 C) 98 F (36.7 C) 99.3 F (37.4 C) 98.3 F (36.8 C)  TempSrc: Oral Axillary Oral Oral  SpO2:  99% 96% 99%  Weight:      Height:        Intake/Output Summary (Last 24 hours) at 01/04/2018 0633 Last data filed at 01/03/2018 1710 Gross per 24 hour  Intake 630 ml  Output 200 ml  Net 430 ml   Filed Weights   01/03/18 0959  Weight: 181 lb 3.5 oz (82.2 kg)    Telemetry    SR - Personally Reviewed  ECG      Physical Exam   GEN: No acute distress.   Neck: No JVD Cardiac: RRR, Crsp valve sounds   Respiratory: Clear to auscultation bilaterally. GI: Soft, nontender, non-distended  MS: No edema; No deformity. Neuro:  Nonfocal  Psych: Normal affect   Labs    Chemistry Recent Labs  Lab 01/03/18 0228  NA 139  K 4.1  CL 111  CO2 21*  GLUCOSE 159*  BUN 49*  CREATININE 1.03  CALCIUM 7.9*  PROT 5.5*  ALBUMIN 2.9*  AST 36  ALT 22  ALKPHOS 34*  BILITOT 0.7  GFRNONAA >60  GFRAA >60  ANIONGAP 7     Hematology Recent Labs  Lab 01/03/18 0228 01/04/18 0545  WBC 14.0* 14.7*  RBC 1.98* 2.12*  HGB 6.8* 7.1*  HCT 20.7*  20.9*  MCV 104.5* 98.6  MCH 34.3* 33.5  MCHC 32.9 34.0  RDW 13.8 18.6*  PLT 170 157    Cardiac EnzymesNo results for input(s): TROPONINI in the last 168 hours.  Recent Labs  Lab 01/03/18 0234  TROPIPOC 0.00     BNPNo results for input(s): BNP, PROBNP in the last 168 hours.   DDimer No results for input(s): DDIMER in the last 168 hours.   Radiology    Ct Abdomen Pelvis W Contrast  Result Date: 01/02/2018 CLINICAL DATA:  84 year old male with upper abdominal pain, severe nausea and weight loss. She has also had 2 episodes of bloody stools. EXAM: CT ABDOMEN AND PELVIS WITH CONTRAST TECHNIQUE: Multidetector CT imaging of the abdomen and pelvis was performed using the standard protocol following bolus administration of intravenous contrast. CONTRAST:  136mL ISOVUE-300 IOPAMIDOL (ISOVUE-300) INJECTION 61% COMPARISON:  Prior CT scan of the abdomen and pelvis 05/26/2017 FINDINGS: Lower chest: Paraseptal pulmonary emphysema. Incompletely imaged prosthetic aortic valve. The heart is normal in size. No pericardial effusion. Unremarkable  visualized distal thoracic esophagus. Hepatobiliary: No focal liver abnormality is seen. Status post cholecystectomy. No biliary dilatation. Pancreas: Unremarkable. No pancreatic ductal dilatation or surrounding inflammatory changes. Spleen: Normal in size without focal abnormality. Adrenals/Urinary Tract: Normal adrenal glands. No enhancing renal lesion, hydronephrosis or nephrolithiasis. Circumscribed low-attenuation lesions present bilaterally. The largest measures 1.6 cm and is consistent with a simple cyst. The smaller lesions are too small to characterize but also statistically highly likely benign cysts. Stomach/Bowel: Colonic diverticular disease without CT evidence of active inflammation. No evidence of obstruction or focal bowel wall thickening. Normal appendix in the right lower quadrant. The terminal ileum is unremarkable. Vascular/Lymphatic: Atherosclerotic  calcifications present throughout the abdominal aorta. Probable moderate focal stenosis of the origin of the superior mesenteric artery secondary to calcified atherosclerotic plaque. The celiac artery is widely patent. The IMA remains patent. No aneurysm or dissection. Reproductive: Prostate is unremarkable. Other: No abdominal wall hernia or abnormality. No abdominopelvic ascites. Musculoskeletal: No acute fracture or aggressive appearing lytic or blastic osseous lesion. Incompletely imaged healed median sternotomy. IMPRESSION: 1. No acute abnormality within the abdomen or pelvis. 2. Calcified atherosclerotic plaque likely results in at least moderate stenosis of the origin of the superior mesenteric artery. The celiac artery remains widely patent as does the IMA. 3. Colonic diverticular disease without CT evidence of active inflammation. 4. Surgical changes of prior cholecystectomy. 5.  Aortic Atherosclerosis (ICD10-170.0) 6.  Emphysema (ICD10-J43.9). Electronically Signed   By: Jacqulynn Cadet M.D.   On: 01/02/2018 14:27    Cardiac Studies    - Left ventricle: Wall thickness was increased in a pattern of mild   LVH. Systolic function was normal. The estimated ejection   fraction was in the range of 55% to 60%. The study is not   technically sufficient to allow evaluation of LV diastolic   function. - Aortic valve: DVI .6 suggesting normal AVR function Post St Jude   AVR no perivalvular regurgitations stable bradients Valve area   (VTI): 1.89 cm^2. Valve area (Vmax): 1.51 cm^2. Valve area   (Vmean): 1.6 cm^2. - Mitral valve: Severely calcified annulus. Moderately thickened,   mildly calcified leaflets . There was mild regurgitation. Valve   area by continuity equation (using LVOT flow): 1.94 cm^2. - Left atrium: The atrium was moderately dilated. - Right atrium: The atrium was mildly dilated. - Atrial septum: No defect or patent foramen ovale was identified. - Pulmonary arteries: PA peak  pressure: 43 mm Hg (S).  Patient Profile     84 y.o. male with history of remote CABG and St Jude AVR (2000).  Now with GI Bleed and syncope  Assessment & Plan    1   Syncope  Dizzy now   Has not taken much food in since Monday   Hgb 7.1 after blood Would give some gentle hydration   May indeed benefit from more blood  CBC later today    2  Melena   GI has seen  Planfor EGD  And possible colonoscopy when INR comes down   INR yesterday 3.7  Todays is pending  Hgb 7.1    2  CAD  No symtpoms of angina  LVEF normal   3  St JUde mechanical AVR   Crisp valve sounds   Echo valve looks good    INR from today pending   Anticoag on hold    4  Hx PAF  Remaiins in SR For questions or updates, please contact [facility] Please consult [email or web address] for contact  info under Cardiology/STEMI.      Signed, Dorris Carnes, MD  01/04/2018, 6:33 AM

## 2018-01-04 NOTE — Progress Notes (Signed)
Pt requested assistance to bathroom.  BP 109/58 while sitting.  BP dropped to 78/41 upon standing and patient became pale and light headed.  Pt sat back down and his feet were elevated and head reclined, BP 134/60.  Pt feels much better after reclining.  MD notified. Andre Lefort

## 2018-01-04 NOTE — Progress Notes (Signed)
CRITICAL VALUE ALERT  Critical Value:  INR 4.54  Date & Time Notied:  01/04/18 0810  Provider Notified: Wynelle Cleveland  Orders Received/Actions taken: await response

## 2018-01-04 NOTE — Progress Notes (Signed)
PROGRESS NOTE    [facility]   TOI:[id number]  DOB: 1931/11/03  DOA: 01/03/2018 PCP: [name], MD   Brief Narrative:  Jason Santana 84 year old male with a St Jude's mechanical aortic valve, chronic atrial fibrillation, coronary artery disease status post CABG and hypertension. About 5 or 6 days ago patient noted watery black stool with severe central abdominal pain and nausea slowly the stool less than and then he stopped having bowel movements about 2 days ago.  On the day of admission, he felt fatigued weak lightheaded and passed out and therefore came to the ER. In the ED hemoglobin noted to be 6.8, INR 3.67 and heme positive stool. He was transfused 1 unit of packed red blood cells  Subjective: Feels less lightheaded today.  No complaints of abdominal pain and no bowel movement in the hospital. ROS: no complaints of nausea, vomiting, constipation diarrhea, cough, dyspnea or dysuria. No other complaints.   Assessment & Plan:   Principal Problem:   Syncope-acute blood loss anemia-orthostatic hypotension -Likely due to volume depletion in setting of GI bleed and anemia -Orthostatic vitals are still positive today- GI has ordered another unit of blood which I agree with   active Problems: Melena -Bleeding stopped 1 day prior to coming to the hospital- GI has been consulted and once his INR drifts down, an EGD will be done-in the meantime he has been started on Protonix    Paroxysmal atrial flutter -Continue Lopressor-Coumadin on hold as mentioned -Follow on telemetry  Hypertension -Lopressor being continued to prevent RVR-Norvasc on hold      H/O mechanical aortic valve replacement -INR is elevated today-Coumadin being held     CAD (coronary artery disease) s/p CABG Continue Crestor-he takes 81 mg of aspirin which is on hold  BPH Continue Proscar and Flomax  Hypothyroidism -Continue Synthroid  DVT prophylaxis: INR elevated Code Status: DO NOT  RESUSCITATE Family Communication: Wife at bedside Disposition Plan: Following telemetry- Consultants:   GI  Cardiology Procedures:   None Antimicrobials:  Anti-infectives (From admission, onward)   None       Objective: Vitals:   01/04/18 0604 01/04/18 1052 01/04/18 1054 01/04/18 1057  BP: (!) 118/48 (!) 109/58 (!) 78/41 134/60  Pulse: 86  (!) 107   Resp: 18     Temp: 98.3 F (36.8 C)     TempSrc: Oral     SpO2: 99%     Weight:      Height:        Intake/Output Summary (Last 24 hours) at 01/04/2018 1211 Last data filed at 01/04/2018 0900 Gross per 24 hour  Intake 675 ml  Output 200 ml  Net 475 ml   Filed Weights   01/03/18 0959  Weight: 82.2 kg (181 lb 3.5 oz)    Examination: General exam: Appears comfortable  HEENT: PERRLA, oral mucosa moist, no sclera icterus or thrush Respiratory system: Clear to auscultation. Respiratory effort normal. Cardiovascular system: S1 & S2 heard, RRR.  Mechanical click heard Gastrointestinal system: Abdomen soft, non-tender, nondistended. Normal bowel sound. No organomegaly Central nervous system: Alert and oriented. No focal neurological deficits. Extremities: No cyanosis, clubbing or edema Skin: No rashes or ulcers Psychiatry:  Mood & affect appropriate.     Data Reviewed: I have personally reviewed following labs and imaging studies  CBC: Recent Labs  Lab 01/03/18 0228 01/04/18 0545  WBC 14.0* 14.7*  NEUTROABS 10.8*  --   HGB 6.8* 7.1*  HCT 20.7* 20.9*  MCV 104.5* 98.6  PLT 170 466   Basic Metabolic Panel: Recent Labs  Lab 01/03/18 0228 01/04/18 0545  NA 139 139  K 4.1 3.7  CL 111 109  CO2 21* 21*  GLUCOSE 159* 188*  BUN 49* 45*  CREATININE 1.03 1.08  CALCIUM 7.9* 8.4*   GFR: Estimated Creatinine Clearance: 53.5 mL/min (by C-G formula based on SCr of 1.08 mg/dL). Liver Function Tests: Recent Labs  Lab 01/03/18 0228 01/04/18 0545  AST 36 30  ALT 22 18  ALKPHOS 34* 32*  BILITOT 0.7 0.4  PROT  5.5* 4.8*  ALBUMIN 2.9* 2.7*   No results for input(s): LIPASE, AMYLASE in the last 168 hours. No results for input(s): AMMONIA in the last 168 hours. Coagulation Profile: Recent Labs  Lab 01/03/18 0315 01/04/18 0545  INR 3.67 4.54*   Cardiac Enzymes: No results for input(s): CKTOTAL, CKMB, CKMBINDEX, TROPONINI in the last 168 hours. BNP (last 3 results) No results for input(s): PROBNP in the last 8760 hours. HbA1C: No results for input(s): HGBA1C in the last 72 hours. CBG: No results for input(s): GLUCAP in the last 168 hours. Lipid Profile: No results for input(s): CHOL, HDL, LDLCALC, TRIG, CHOLHDL, LDLDIRECT in the last 72 hours. Thyroid Function Tests: No results for input(s): TSH, T4TOTAL, FREET4, T3FREE, THYROIDAB in the last 72 hours. Anemia Panel: No results for input(s): VITAMINB12, FOLATE, FERRITIN, TIBC, IRON, RETICCTPCT in the last 72 hours. Urine analysis:    Component Value Date/Time   COLORURINE STRAW (A) 01/03/2018 1710   APPEARANCEUR CLEAR 01/03/2018 1710   LABSPEC 1.017 01/03/2018 1710   PHURINE 5.0 01/03/2018 1710   GLUCOSEU 50 (A) 01/03/2018 1710   HGBUR NEGATIVE 01/03/2018 1710   BILIRUBINUR NEGATIVE 01/03/2018 1710   KETONESUR NEGATIVE 01/03/2018 1710   PROTEINUR NEGATIVE 01/03/2018 1710   NITRITE NEGATIVE 01/03/2018 1710   LEUKOCYTESUR NEGATIVE 01/03/2018 1710   Sepsis Labs: @LABRCNTIP (procalcitonin:4,lacticidven:4) )No results found for this or any previous visit (from the past 240 hour(s)).       Radiology Studies: No results found.    Scheduled Meds: . cholecalciferol  2,000 Units Oral Daily  . finasteride  5 mg Oral Daily  . levothyroxine  75 mcg Oral QAC breakfast  . metoprolol tartrate  50 mg Oral BID  . pantoprazole  40 mg Oral BID  . rosuvastatin  20 mg Oral Q M,W,F  . saccharomyces boulardii  250 mg Oral BID  . sodium chloride flush  3 mL Intravenous Q12H  . tamsulosin  0.4 mg Oral Daily   Continuous Infusions: . sodium  chloride    . sodium chloride 50 mL/hr at 01/04/18 0844  . sodium chloride       LOS: 1 day    Time spent in minutes: 35    Debbe Odea, MD [facility] Pager: [email or web address] Password [email or web address] 01/04/2018, 12:11 PM

## 2018-01-04 NOTE — Progress Notes (Signed)
[facility] Progress Note  Jason Santana 84 y.o. Nov 23, 1931  CC:  GI bleed   Subjective: further bleeding episodes. No bowel movement since yesterday. Complaining of some nausea. Had dizziness and drop in blood pressure while going to bathroom this morning.  ROS : complaining of fatigue and weakness. Negative for active chest pain.   Objective: Vital signs in last 24 hours: Vitals:   01/04/18 1054 01/04/18 1057  BP: (!) 78/41 134/60  Pulse: (!) 107   Resp:    Temp:    SpO2:      Physical Exam:  General:  Alert, cooperative, no distress, appears stated age  Head:  Normocephalic, without obvious abnormality, atraumatic  Eyes:  , EOM's intact,   Lungs:   Clear to auscultation bilaterally, respirations unlabored  Heart:  Regular rate and rhythm, Murmur   Abdomen:   Soft, non-tender, nondistended, bowel sounds present. No peritoneal signs   Extremities: Extremities normal, atraumatic, no  edema       Lab Results: Recent Labs    01/03/18 0228 01/04/18 0545  NA 139 139  K 4.1 3.7  CL 111 109  CO2 21* 21*  GLUCOSE 159* 188*  BUN 49* 45*  CREATININE 1.03 1.08  CALCIUM 7.9* 8.4*   Recent Labs    01/03/18 0228 01/04/18 0545  AST 36 30  ALT 22 18  ALKPHOS 34* 32*  BILITOT 0.7 0.4  PROT 5.5* 4.8*  ALBUMIN 2.9* 2.7*   Recent Labs    01/03/18 0228 01/04/18 0545  WBC 14.0* 14.7*  NEUTROABS 10.8*  --   HGB 6.8* 7.1*  HCT 20.7* 20.9*  MCV 104.5* 98.6  PLT 170 157   Recent Labs    01/03/18 0315 01/04/18 0545  LABPROT 36.2* 42.7*  INR 3.67 4.54*      Assessment/Plan: - melena. Patient is complaining of black color stool for last few days. CT scan negative for any acute changes. - acute blood loss anemia. Patient complaining of dizziness and weakness. - Aortic valve replacement. On chronic anticoagulation. Coumadin on hold. INR trending up 4.5 for today - Centralized abdominal pain. Patient was treated with presumed diverticulitis. CT scan  showed no evidence of diverticulitis.abdominal pain resolved.   Recommendations -------------------------- - transfuse 1 unit of blood. - monitor daily CBC and INR. - Patient requesting soft diet - EGD once INR is less than 2. If EGD negative and if there is any evidence of ongoing bleeding or drop in hemoglobin, may need colonoscopy for further evaluation. - GI will follow   Otis Brace MD, [place] 01/04/2018, 11:12 AM  Contact #  [phone]

## 2018-01-04 NOTE — Progress Notes (Signed)
CRITICAL VALUE ALERT  Critical Value:  Hgb 6.5  Date & Time Notied:  01/04/18 1537  Provider Notified: not notified; pt getting 1u PRBC at present; repeat CBC 2hrs after unit completes

## 2018-01-05 ENCOUNTER — Encounter (HOSPITAL_COMMUNITY): Payer: Self-pay | Admitting: *Deleted

## 2018-01-05 LAB — CBC
HCT: 20.8 % — ABNORMAL LOW (ref 39.0–52.0)
HCT: 26.4 % — ABNORMAL LOW (ref 39.0–52.0)
Hemoglobin: 7 g/dL — ABNORMAL LOW (ref 13.0–17.0)
Hemoglobin: 8.8 g/dL — ABNORMAL LOW (ref 13.0–17.0)
MCH: 32.7 pg (ref 26.0–34.0)
MCH: 32.1 pg (ref 26.0–34.0)
MCHC: 33.7 g/dL (ref 30.0–36.0)
MCHC: 33.3 g/dL (ref 30.0–36.0)
MCV: 97.2 fL (ref 78.0–100.0)
MCV: 96.4 fL (ref 78.0–100.0)
PLATELETS: 166 10*3/uL (ref 150–400)
Platelets: 173 10*3/uL (ref 150–400)
RBC: 2.14 MIL/uL — AB (ref 4.22–5.81)
RBC: 2.74 MIL/uL — AB (ref 4.22–5.81)
RDW: 18.9 % — ABNORMAL HIGH (ref 11.5–15.5)
RDW: 18.9 % — AB (ref 11.5–15.5)
WBC: 14.6 10*3/uL — AB (ref 4.0–10.5)
WBC: 10.7 10*3/uL — AB (ref 4.0–10.5)

## 2018-01-05 LAB — PROTIME-INR
INR: 2.96
Prothrombin Time: 30.6 seconds — ABNORMAL HIGH (ref 11.4–15.2)

## 2018-01-05 LAB — PREPARE RBC (CROSSMATCH)

## 2018-01-05 MED ORDER — PANTOPRAZOLE SODIUM 40 MG IV SOLR
40.0000 mg | Freq: Two times a day (BID) | INTRAVENOUS | Status: DC
  Administered 2018-01-05 – 2018-01-08 (×6): 40 mg via INTRAVENOUS
  Filled 2018-01-05 (×9): qty 40

## 2018-01-05 MED ORDER — SODIUM CHLORIDE 0.9 % IV SOLN: Freq: Once | INTRAVENOUS | Status: AC

## 2018-01-05 MED ORDER — SODIUM CHLORIDE 0.9 % IV SOLN: Freq: Once | INTRAVENOUS | Status: DC

## 2018-01-05 NOTE — Progress Notes (Signed)
Progress Note  Patient Name: Jason Santana Date of Encounter: 01/05/2018  Primary Cardiologist: Sinclair Grooms, MD   Subjective   NO CP   Breathing is OK  Not dizzy but hasnt gotten up   Inpatient Medications    Scheduled Meds: . cholecalciferol  2,000 Units Oral Daily  . finasteride  5 mg Oral Daily  . levothyroxine  75 mcg Oral QAC breakfast  . metoprolol tartrate  50 mg Oral BID  . pantoprazole  40 mg Oral BID  . rosuvastatin  20 mg Oral Q M,W,F  . saccharomyces boulardii  250 mg Oral BID  . sodium chloride flush  3 mL Intravenous Q12H  . tamsulosin  0.4 mg Oral Daily   Continuous Infusions: . sodium chloride     PRN Meds:  Vital Signs    Vitals:   01/04/18 1545 01/04/18 1846 01/04/18 2138 01/05/18 0502  BP: (!) 129/50 (!) 139/55 140/60 (!) 99/45  Pulse: 98 99 93 80  Resp: 20 20 18 18   Temp: 98.6 F (37 C) 99.1 F (37.3 C) 98.9 F (37.2 C) 99.1 F (37.3 C)  TempSrc: Oral Oral Oral Oral  SpO2: 99% 100% 97% 97%  Weight:      Height:        Intake/Output Summary (Last 24 hours) at 01/05/2018 0723 Last data filed at 01/05/2018 0506 Gross per 24 hour  Intake 1490.83 ml  Output 1501 ml  Net -10.17 ml   Filed Weights   01/03/18 0959  Weight: 181 lb 3.5 oz (82.2 kg)    Telemetry    SR - Personally Reviewed  ECG      Physical Exam   GEN: No acute distress.  Comfortable Neck: No JVD Cardiac: RRR, Crisp valve sounds   Respiratory: Clear to auscultation bilaterally. GI: Soft, nontender, non-distended  MS: No edema; No deformity. Neuro:  Nonfocal  Psych: Normal affect   Labs    Chemistry Recent Labs  Lab 01/03/18 0228 01/04/18 0545  NA 139 139  K 4.1 3.7  CL 111 109  CO2 21* 21*  GLUCOSE 159* 188*  BUN 49* 45*  CREATININE 1.03 1.08  CALCIUM 7.9* 8.4*  PROT 5.5* 4.8*  ALBUMIN 2.9* 2.7*  AST 36 30  ALT 22 18  ALKPHOS 34* 32*  BILITOT 0.7 0.4  GFRNONAA >60 >60  GFRAA >60 >60  ANIONGAP 7 9     Hematology Recent Labs  Lab  01/04/18 1445 01/04/18 2129 01/05/18 0505  WBC 17.9* 16.6* 14.6*  RBC 1.92* 2.24* 2.14*  HGB 6.5* 7.3* 7.0*  HCT 19.1* 21.5* 20.8*  MCV 99.5 96.0 97.2  MCH 33.9 32.6 32.7  MCHC 34.0 34.0 33.7  RDW 18.1* 17.4* 18.9*  PLT 168 160 166    Cardiac EnzymesNo results for input(s): TROPONINI in the last 168 hours.  Recent Labs  Lab 01/03/18 0234  TROPIPOC 0.00     BNPNo results for input(s): BNP, PROBNP in the last 168 hours.   DDimer No results for input(s): DDIMER in the last 168 hours.   Radiology    No results found.  Cardiac Studies    - Left ventricle: Wall thickness was increased in a pattern of mild   LVH. Systolic function was normal. The estimated ejection   fraction was in the range of 55% to 60%. The study is not   technically sufficient to allow evaluation of LV diastolic   function. - Aortic valve: DVI .6 suggesting normal AVR function Post [address]  Jude   AVR no perivalvular regurgitations stable bradients Valve area   (VTI): 1.89 cm^2. Valve area (Vmax): 1.51 cm^2. Valve area   (Vmean): 1.6 cm^2. - Mitral valve: Severely calcified annulus. Moderately thickened,   mildly calcified leaflets . There was mild regurgitation. Valve   area by continuity equation (using LVOT flow): 1.94 cm^2. - Left atrium: The atrium was moderately dilated. - Right atrium: The atrium was mildly dilated. - Atrial septum: No defect or patent foramen ovale was identified. - Pulmonary arteries: PA peak pressure: 43 mm Hg (S).  Patient Profile     84 y.o. male with history of remote CABG and St Jude AVR (2000).  Now with GI Bleed and syncope  Assessment & Plan    1   Syncope  No recurrence   Dizzy yesteday IMproved with Tx and IV fluids    2  Melena    GI following   Plan endo when INR less than 2  3  ANemia   I would recomm Tx with Hx of CAD    Will defer Fe supplementation to primary service  2  CAD  No symtpoms of angina  LVEF normal   3  St JUde mechanical AVR   Crisp valve  sounds   Echo valve looks good    INR from today pending   Anticoag on hold    INR finally dropping   2.96 today   Follow      4  Hx PAF  Remaiins in SR For questions or updates, please contact [facility] Please consult [email or web address] for contact info under Cardiology/STEMI.      Signed, Dorris Carnes, MD  01/05/2018, 7:23 AM

## 2018-01-05 NOTE — Progress Notes (Signed)
Pt tolerated the one unit of RBC's well. Family at bedside.

## 2018-01-05 NOTE — Progress Notes (Signed)
Report received from Cindy Millar, RN. No change in previous assessment. 

## 2018-01-05 NOTE — Progress Notes (Signed)
[name] Progress Note  Jason Santana 84 y.o. September 01, 1932  CC:  GI bleed   Subjective:  Patient continues to have dark stools. He denies any abdominal pain. Denies nausea vomiting.  ROS : complaining of fatigue and weakness. Negative for active chest pain and shortness of breath.   Objective: Vital signs in last 24 hours: Vitals:   01/05/18 1000 01/05/18 1106  BP: (!) 123/54 (!) 123/54  Pulse: 77 77  Resp:    Temp:    SpO2: 100%     Physical Exam:  General:  Alert, cooperative, no distress, appears stated age  Head:  Normocephalic, without obvious abnormality, atraumatic  Eyes:  , EOM's intact,   Lungs:   Clear to auscultation bilaterally, respirations unlabored  Heart:  Regular rate and rhythm, Murmur   Abdomen:   Soft, non-tender, nondistended, bowel sounds present. No peritoneal signs   Extremities: Extremities normal, atraumatic, no  edema       Lab Results: Recent Labs    01/03/18 0228 01/04/18 0545  NA 139 139  K 4.1 3.7  CL 111 109  CO2 21* 21*  GLUCOSE 159* 188*  BUN 49* 45*  CREATININE 1.03 1.08  CALCIUM 7.9* 8.4*   Recent Labs    01/03/18 0228 01/04/18 0545  AST 36 30  ALT 22 18  ALKPHOS 34* 32*  BILITOT 0.7 0.4  PROT 5.5* 4.8*  ALBUMIN 2.9* 2.7*   Recent Labs    01/03/18 0228  01/04/18 2129 01/05/18 0505  WBC 14.0*   < > 16.6* 14.6*  NEUTROABS 10.8*  --  12.5*  --   HGB 6.8*   < > 7.3* 7.0*  HCT 20.7*   < > 21.5* 20.8*  MCV 104.5*   < > 96.0 97.2  PLT 170   < > 160 166   < > = values in this interval not displayed.   Recent Labs    01/04/18 0545 01/05/18 0750  LABPROT 42.7* 30.6*  INR 4.54* 2.96      Assessment/Plan: - melena. Patient is complaining of black color stool for last few days. CT scan negative for any acute changes. - acute blood loss anemia. No improvement despite of blood transfusion - Aortic valve replacement. On chronic anticoagulation. Coumadin on hold. INR trending up 4.5 for today -  Centralized abdominal pain. Patient was treated with presumed diverticulitis. CT scan showed no evidence of diverticulitis.abdominal pain resolved.   Recommendations -------------------------- - no improvement in hemoglobin despite of blood transfusion. Continues to have dark stools.change Protonix to IV twice a day. Discussed with the pharmacist. Apparently IV Protonix is on national critical shortage - INR 2.96 today. - primary team planning for another unit of blood transfusion today. - EGD tentatively tomorrow. Hopefully INR will be around 2  Tomorrow. - nothing by mouth past midnight   Otis Brace MD, FACP 01/05/2018, 11:58 AM  Contact #  [phone]

## 2018-01-05 NOTE — Progress Notes (Signed)
PROGRESS NOTE    Jason Santana   TWK:[id number]  DOB: Nov 17, 1931  DOA: 01/03/2018 PCP: [name], MD   Brief Narrative:  Jason Santana 84 year old male with a St Jude's mechanical aortic valve, chronic atrial fibrillation, coronary artery disease status post CABG and hypertension. About 5 or 6 days ago patient noted watery black stool with severe central abdominal pain and nausea slowly the stool less than and then he stopped having bowel movements about 2 days ago.  On the day of admission, he felt fatigued weak lightheaded and passed out and therefore came to the ER. In the ED hemoglobin noted to be 6.8, INR 3.67 and heme positive stool. He was transfused 1 unit of packed red blood cells  Subjective: He no longer feels lightheaded.  He ambulated to the bathroom without difficulty but had another black bowel movement.  No abdominal pain nausea or vomiting.  Eating solid food.  Assessment & Plan:   Principal Problem:   Syncope-acute blood loss anemia-orthostatic hypotension -Likely due to volume depletion in setting of GI bleed and anemia -Orthostatic symptoms resolved today -Hemoglobin is still 7 and the patient will be transfused 1 more unit of packed red blood cells which has already been ordered by cardiology  active Problems: Melena -Bleeding stopped 1 day prior to coming to the hospital but as of yesterday he has begun having dark stools again - GI has been consulted and once his INR drifts down, an EGD will be done-in the meantime he has been started on Protonix twice daily    Paroxysmal atrial flutter -Continue Lopressor-Coumadin on hold as mentioned -Follow on telemetry  Hypertension -Lopressor being continued to prevent RVR-Norvasc on hold      H/O mechanical aortic valve replacement -INR is therapeutic today at 2.9-Coumadin being held     CAD (coronary artery disease) s/p CABG Continue Crestor-he takes 81 mg of aspirin which is on hold  BPH Continue Proscar  and Flomax  Hypothyroidism -Continue Synthroid  DVT prophylaxis: INR elevated Code Status: DO NOT RESUSCITATE Family Communication: Wife at bedside Disposition Plan: Following telemetry- Consultants:   GI  Cardiology Procedures:   None Antimicrobials:  Anti-infectives (From admission, onward)   None       Objective: Vitals:   01/04/18 2138 01/05/18 0502 01/05/18 1000 01/05/18 1106  BP: 140/60 (!) 99/45 (!) 123/54 (!) 123/54  Pulse: 93 80 77 77  Resp: 18 18    Temp: 98.9 F (37.2 C) 99.1 F (37.3 C)    TempSrc: Oral Oral    SpO2: 97% 97% 100%   Weight:      Height:        Intake/Output Summary (Last 24 hours) at 01/05/2018 1212 Last data filed at 01/05/2018 1109 Gross per 24 hour  Intake 1573.83 ml  Output 1504 ml  Net 69.83 ml   Filed Weights   01/03/18 0959  Weight: 82.2 kg (181 lb 3.5 oz)    Examination: General exam: Appears comfortable  HEENT: PERRLA, oral mucosa moist, no sclera icterus or thrush Respiratory system: Clear to auscultation. Respiratory effort normal. Cardiovascular system: S1 & S2 heard, RRR.  Mechanical click heard Gastrointestinal system: Abdomen soft, non-tender, nondistended. Normal bowel sound. No organomegaly Central nervous system: Alert and oriented. No focal neurological deficits. Extremities: No cyanosis, clubbing or edema Skin: No rashes or ulcers Psychiatry:  Mood & affect appropriate.     Data Reviewed: I have personally reviewed following labs and imaging studies  CBC: Recent Labs  Lab  01/03/18 0228 01/04/18 0545 01/04/18 1445 01/04/18 2129 01/05/18 0505  WBC 14.0* 14.7* 17.9* 16.6* 14.6*  NEUTROABS 10.8*  --   --  12.5*  --   HGB 6.8* 7.1* 6.5* 7.3* 7.0*  HCT 20.7* 20.9* 19.1* 21.5* 20.8*  MCV 104.5* 98.6 99.5 96.0 97.2  PLT 170 157 168 160 211   Basic Metabolic Panel: Recent Labs  Lab 01/03/18 0228 01/04/18 0545  NA 139 139  K 4.1 3.7  CL 111 109  CO2 21* 21*  GLUCOSE 159* 188*  BUN 49* 45*    CREATININE 1.03 1.08  CALCIUM 7.9* 8.4*   GFR: Estimated Creatinine Clearance: 53.5 mL/min (by C-G formula based on SCr of 1.08 mg/dL). Liver Function Tests: Recent Labs  Lab 01/03/18 0228 01/04/18 0545  AST 36 30  ALT 22 18  ALKPHOS 34* 32*  BILITOT 0.7 0.4  PROT 5.5* 4.8*  ALBUMIN 2.9* 2.7*   No results for input(s): LIPASE, AMYLASE in the last 168 hours. No results for input(s): AMMONIA in the last 168 hours. Coagulation Profile: Recent Labs  Lab 01/03/18 0315 01/04/18 0545 01/05/18 0750  INR 3.67 4.54* 2.96   Cardiac Enzymes: No results for input(s): CKTOTAL, CKMB, CKMBINDEX, TROPONINI in the last 168 hours. BNP (last 3 results) No results for input(s): PROBNP in the last 8760 hours. HbA1C: No results for input(s): HGBA1C in the last 72 hours. CBG: No results for input(s): GLUCAP in the last 168 hours. Lipid Profile: No results for input(s): CHOL, HDL, LDLCALC, TRIG, CHOLHDL, LDLDIRECT in the last 72 hours. Thyroid Function Tests: No results for input(s): TSH, T4TOTAL, FREET4, T3FREE, THYROIDAB in the last 72 hours. Anemia Panel: No results for input(s): VITAMINB12, FOLATE, FERRITIN, TIBC, IRON, RETICCTPCT in the last 72 hours. Urine analysis:    Component Value Date/Time   COLORURINE STRAW (A) 01/03/2018 1710   APPEARANCEUR CLEAR 01/03/2018 1710   LABSPEC 1.017 01/03/2018 1710   PHURINE 5.0 01/03/2018 1710   GLUCOSEU 50 (A) 01/03/2018 1710   HGBUR NEGATIVE 01/03/2018 1710   BILIRUBINUR NEGATIVE 01/03/2018 1710   KETONESUR NEGATIVE 01/03/2018 1710   PROTEINUR NEGATIVE 01/03/2018 1710   NITRITE NEGATIVE 01/03/2018 1710   LEUKOCYTESUR NEGATIVE 01/03/2018 1710   Sepsis Labs: @LABRCNTIP (procalcitonin:4,lacticidven:4) )No results found for this or any previous visit (from the past 240 hour(s)).       Radiology Studies: No results found.    Scheduled Meds: . cholecalciferol  2,000 Units Oral Daily  . finasteride  5 mg Oral Daily  . levothyroxine   75 mcg Oral QAC breakfast  . metoprolol tartrate  50 mg Oral BID  . pantoprazole (PROTONIX) IV  40 mg Intravenous Q12H  . rosuvastatin  20 mg Oral Q M,W,F  . saccharomyces boulardii  250 mg Oral BID  . sodium chloride flush  3 mL Intravenous Q12H  . tamsulosin  0.4 mg Oral Daily   Continuous Infusions: . sodium chloride    . sodium chloride       LOS: 2 days    Time spent in minutes: 35    Debbe Odea, MD [facility] Pager: [email or web address] Password [email or web address] 01/05/2018, 12:12 PM

## 2018-01-06 ENCOUNTER — Encounter (HOSPITAL_COMMUNITY): Payer: Self-pay

## 2018-01-06 ENCOUNTER — Encounter (HOSPITAL_COMMUNITY)
Admission: EM | Disposition: A | Discharge status: Home / Self Care | Payer: Self-pay | Source: Home / Self Care | Attending: Internal Medicine

## 2018-01-06 ENCOUNTER — Inpatient Hospital Stay (HOSPITAL_COMMUNITY): Payer: Medicare Other | Admitting: Certified Registered Nurse Anesthetist

## 2018-01-06 DIAGNOSIS — Z7901 Long term (current) use of anticoagulants: Secondary | ICD-10-CM

## 2018-01-06 HISTORY — PX: ESOPHAGOGASTRODUODENOSCOPY (EGD) WITH PROPOFOL: SHX5813

## 2018-01-06 LAB — BPAM RBC
Blood Product Expiration Date: 201905242359
Blood Product Expiration Date: 201905242359
Blood Product Expiration Date: 201905262359
Blood Product Expiration Date: 201905272359
ISSUE DATE / TIME: 201905030543
ISSUE DATE / TIME: 201905031232
ISSUE DATE / TIME: 201905041522
ISSUE DATE / TIME: 201905051309
UNIT TYPE AND RH: 6200
UNIT TYPE AND RH: 6200
Unit Type and Rh: 6200
Unit Type and Rh: 6200

## 2018-01-06 LAB — TYPE AND SCREEN
ABO/RH(D): A POS
Antibody Screen: NEGATIVE
UNIT DIVISION: 0
Unit division: 0
Unit division: 0
Unit division: 0

## 2018-01-06 LAB — CBC
HCT: 23.9 % — ABNORMAL LOW (ref 39.0–52.0)
HCT: 30.2 % — ABNORMAL LOW (ref 39.0–52.0)
HEMOGLOBIN: 8 g/dL — AB (ref 13.0–17.0)
Hemoglobin: 9.9 g/dL — ABNORMAL LOW (ref 13.0–17.0)
MCH: 32.4 pg (ref 26.0–34.0)
MCH: 31.9 pg (ref 26.0–34.0)
MCHC: 33.5 g/dL (ref 30.0–36.0)
MCHC: 32.8 g/dL (ref 30.0–36.0)
MCV: 96.8 fL (ref 78.0–100.0)
MCV: 97.4 fL (ref 78.0–100.0)
PLATELETS: 191 10*3/uL (ref 150–400)
Platelets: 157 10*3/uL (ref 150–400)
RBC: 2.47 MIL/uL — ABNORMAL LOW (ref 4.22–5.81)
RBC: 3.1 MIL/uL — AB (ref 4.22–5.81)
RDW: 19.2 % — ABNORMAL HIGH (ref 11.5–15.5)
RDW: 19.8 % — AB (ref 11.5–15.5)
WBC: 9.7 10*3/uL (ref 4.0–10.5)
WBC: 10.6 10*3/uL — AB (ref 4.0–10.5)

## 2018-01-06 LAB — PROTIME-INR
INR: 1.89
PROTHROMBIN TIME: 21.6 s — AB (ref 11.4–15.2)

## 2018-01-06 LAB — HEPARIN LEVEL (UNFRACTIONATED): HEPARIN UNFRACTIONATED: 0.46 [IU]/mL (ref 0.30–0.70)

## 2018-01-06 LAB — HEMOGLOBIN AND HEMATOCRIT, BLOOD
HCT: 27.1 % — ABNORMAL LOW (ref 39.0–52.0)
HEMOGLOBIN: 8.8 g/dL — AB (ref 13.0–17.0)

## 2018-01-06 LAB — APTT: aPTT: 56 seconds — ABNORMAL HIGH (ref 24–36)

## 2018-01-06 SURGERY — ESOPHAGOGASTRODUODENOSCOPY (EGD) WITH PROPOFOL
Anesthesia: Monitor Anesthesia Care

## 2018-01-06 MED ORDER — LIDOCAINE 2% (20 MG/ML) 5 ML SYRINGE
INTRAMUSCULAR | Status: DC | PRN
  Administered 2018-01-06: 80 mg via INTRAVENOUS

## 2018-01-06 MED ORDER — PROPOFOL 10 MG/ML IV BOLUS
INTRAVENOUS | Status: DC | PRN
  Administered 2018-01-06: 10 mg via INTRAVENOUS

## 2018-01-06 MED ORDER — PROPOFOL 10 MG/ML IV BOLUS
INTRAVENOUS | Status: AC
  Filled 2018-01-06: qty 40

## 2018-01-06 MED ORDER — PROPOFOL 500 MG/50ML IV EMUL
INTRAVENOUS | Status: DC | PRN
  Administered 2018-01-06: 125 ug/kg/min via INTRAVENOUS

## 2018-01-06 MED ORDER — LACTATED RINGERS IV SOLN
INTRAVENOUS | Status: DC
  Administered 2018-01-06: 1000 mL via INTRAVENOUS

## 2018-01-06 MED ORDER — ONDANSETRON HCL 4 MG/2ML IJ SOLN
INTRAMUSCULAR | Status: DC | PRN
  Administered 2018-01-06: 4 mg via INTRAVENOUS

## 2018-01-06 MED ORDER — HEPARIN (PORCINE) IN NACL 100-0.45 UNIT/ML-% IJ SOLN
1000.0000 [IU]/h | INTRAMUSCULAR | Status: DC
  Administered 2018-01-06: 1000 [IU]/h via INTRAVENOUS
  Filled 2018-01-06: qty 250

## 2018-01-06 MED ORDER — SODIUM CHLORIDE 0.9 % IV SOLN: INTRAVENOUS | Status: DC

## 2018-01-06 SURGICAL SUPPLY — 15 items

## 2018-01-06 NOTE — Progress Notes (Signed)
ANTICOAGULATION CONSULT NOTE - Initial Consult  Pharmacy Consult for heparin drip Indication: mechanical aortic valve, on warfarin PTA  Allergies  Allergen Reactions  . Vicodin [Hydrocodone-Acetaminophen]     Severe sensitivity  . Zocor [Simvastatin]     Short memory loss.  [name] [Olmesartan]     Abdominal cramping and increased stools/ irritable bowel  . Codeine Nausea And Vomiting  . Lopid [Gemfibrozil] Other (See Comments)    transaminitis  . Monopril [Fosinopril]     transaminitis  . Phenergan [Promethazine Hcl]     Severe somnolence.  . Prilosec [Omeprazole]     dyspepsia    Patient Measurements: Height: 5\' 10"  (177.8 cm) Weight: 181 lb (82.1 kg) IBW/kg (Calculated) : 73 Heparin Dosing Weight: 82 kg  Vital Signs: Temp: 97.9 F (36.6 C) (05/06 0946) Temp Source: Oral (05/06 0946) BP: 124/59 (05/06 0946) Pulse Rate: 69 (05/06 0946)  Labs: Recent Labs    01/04/18 0545  01/05/18 0505 01/05/18 0750 01/05/18 1941 01/06/18 0528 01/06/18 1212  HGB 7.1*   < > 7.0*  --  8.8* 8.0* 8.8*  HCT 20.9*   < > 20.8*  --  26.4* 23.9* 27.1*  PLT 157   < > 166  --  173 157  --   APTT  --   --   --   --   --   --  56*  LABPROT 42.7*  --   --  30.6*  --  21.6*  --   INR 4.54*  --   --  2.96  --  1.89  --   CREATININE 1.08  --   --   --   --   --   --    < > = values in this interval not displayed.    Estimated Creatinine Clearance: 53.5 mL/min (by C-G formula based on SCr of 1.08 mg/dL).   Medical History: Past Medical History:  Diagnosis Date  . Atrial fibrillation ([place])   . CAD (coronary artery disease) of artery bypass graft 2000   2000 with multiple bypasses   . Chronic anticoagulation    Coumadin therapy for mechanical aortic valve. Postoperative atrial fibrillation.   . Essential hypertension   . H/O mechanical aortic valve replacement 2000   St Jude AVR  . Hypothyroidism   . Premature ventricular contractions     Assessment: Pharmacy consulted to  dose and monitor heparin drip in this 84 year old male who has a mechanical aortic valve and atrial fibrillation. Patient was taking warfarin PTA and admitted with melena and underwent EGD today. Per GI notes, ok to resume heparin as no evidence of active bleeding.  Per MD, will hold off on warfarin this evening.  INR = 1.9, Hgb 8, Plt 157  Goal of Therapy:  Heparin level 0.3-0.5 units/ml (lower target range per GI MD Brahmbhatt) Monitor platelets by anticoagulation protocol: Yes   Plan:  No initial bolus Start heparin 1000 units/hr Will check 8 hour HL and CBC Daily CBC while on heparin drip  Lenis Noon, PharmD, BCPS Clinical Pharmacist 01/06/2018,1:47 PM

## 2018-01-06 NOTE — Brief Op Note (Signed)
01/03/2018 - 01/06/2018  8:19 AM  PATIENT:  Jason Santana  84 y.o. male  PRE-OPERATIVE DIAGNOSIS:  Melena  POST-OPERATIVE DIAGNOSIS:  polypoid mucosa in duodenal bulb, gastric ulcer, gastritis  PROCEDURE:  Procedure(s): ESOPHAGOGASTRODUODENOSCOPY (EGD) WITH PROPOFOL (N/A)  SURGEON:  Surgeon(s) and Role:    * Cayleb Jarnigan, MD - Primary  Findings ------------ - EGD showed medium-sized clean-based prepyloric ulcer and gastric erosions.No evidence of active bleeding. Nodular mucosa in the duodenal bulb. Biopsies taken.  Recommendations ------------------------ - Okay to start heparin drip with lower therapeutic goal. - Recheck H&H at around 1 PM. -if  ongoing drop in hemoglobin,recommend colonoscopy for further evaluation. Heparin drip may need to be on hold for 6 hours prior to procedure. - clear liquid diet. Nothing by mouth past midnight. - GI will follow.   Otis Brace MD, [place] 01/06/2018, 8:21 AM  Contact #  [phone]

## 2018-01-06 NOTE — Anesthesia Postprocedure Evaluation (Signed)
Anesthesia Post Note  Patient: Jason Santana  Procedure(s) Performed: ESOPHAGOGASTRODUODENOSCOPY (EGD) WITH PROPOFOL (N/A )     Patient location during evaluation: PACU Anesthesia Type: MAC Level of consciousness: awake and alert Pain management: pain level controlled Vital Signs Assessment: post-procedure vital signs reviewed and stable Respiratory status: spontaneous breathing, nonlabored ventilation, respiratory function stable and patient connected to nasal cannula oxygen Cardiovascular status: stable and blood pressure returned to baseline Postop Assessment: no apparent nausea or vomiting Anesthetic complications: no    Last Vitals:  Vitals:   01/06/18 0820 01/06/18 0946  BP: (!) 109/47 (!) 124/59  Pulse: 63 69  Resp: 18 16  Temp:  36.6 C  SpO2: 100% 100%    Last Pain:  Vitals:   01/06/18 0957  TempSrc:   PainSc: 0-No pain                 Quintavious Rinck COKER

## 2018-01-06 NOTE — Transfer of Care (Signed)
Immediate Anesthesia Transfer of Care Note  Patient: Jason Santana  Procedure(s) Performed: ESOPHAGOGASTRODUODENOSCOPY (EGD) WITH PROPOFOL (N/A )  Patient Location: PACU  Anesthesia Type:MAC  Level of Consciousness: awake, alert  and oriented  Airway & Oxygen Therapy: Patient Spontanous Breathing and Patient connected to nasal cannula oxygen  Post-op Assessment: Report given to RN and Post -op Vital signs reviewed and stable  Post vital signs: Reviewed and stable  Last Vitals:  Vitals Value Taken Time  BP 82/53 01/06/2018  8:05 AM  Temp    Pulse 64 01/06/2018  8:06 AM  Resp 18 01/06/2018  8:06 AM  SpO2 98 % 01/06/2018  8:06 AM  Vitals shown include unvalidated device data.  Last Pain:  Vitals:   01/06/18 0652  TempSrc: Oral  PainSc: 0-No pain      Patients Stated Pain Goal: 0 (62/86/38 1771)  Complications: No apparent anesthesia complications

## 2018-01-06 NOTE — Anesthesia Preprocedure Evaluation (Signed)
Anesthesia Evaluation  Patient identified by MRN, date of birth, ID band Patient awake    Reviewed: Allergy & Precautions, NPO status , Patient's Chart, lab work & pertinent test results  Airway Mallampati: II  TM Distance: >3 FB Neck ROM: Full    Dental  (+) Partial Lower, Partial Upper   Pulmonary former smoker,    breath sounds clear to auscultation       Cardiovascular hypertension,  Rhythm:Regular Rate:Normal     Neuro/Psych    GI/Hepatic   Endo/Other    Renal/GU      Musculoskeletal   Abdominal   Peds  Hematology   Anesthesia Other Findings   Reproductive/Obstetrics                             Anesthesia Physical Anesthesia Plan  ASA: III  Anesthesia Plan: MAC   Post-op Pain Management:    Induction: Intravenous  PONV Risk Score and Plan: Propofol infusion  Airway Management Planned: Natural Airway and Simple Face Mask  Additional Equipment:   Intra-op Plan:   Post-operative Plan:   Informed Consent: I have reviewed the patients History and Physical, chart, labs and discussed the procedure including the risks, benefits and alternatives for the proposed anesthesia with the patient or authorized representative who has indicated his/her understanding and acceptance.     Plan Discussed with: Anesthesiologist and CRNA  Anesthesia Plan Comments:         Anesthesia Quick Evaluation

## 2018-01-06 NOTE — Progress Notes (Addendum)
Progress Note  Patient Name: Jason Santana Date of Encounter: 01/06/2018  Primary Cardiologist: Belva Crome III, MD   Subjective   No chest pain, no SOB, sitting on side of bed having liquids.     Inpatient Medications    Scheduled Meds: . [MAR Hold] cholecalciferol  2,000 Units Oral Daily  . [MAR Hold] finasteride  5 mg Oral Daily  . [MAR Hold] levothyroxine  75 mcg Oral QAC breakfast  . [MAR Hold] metoprolol tartrate  50 mg Oral BID  . [MAR Hold] pantoprazole (PROTONIX) IV  40 mg Intravenous Q12H  . [MAR Hold] rosuvastatin  20 mg Oral Q M,W,F  . [MAR Hold] saccharomyces boulardii  250 mg Oral BID  . [MAR Hold] sodium chloride flush  3 mL Intravenous Q12H  . [MAR Hold] tamsulosin  0.4 mg Oral Daily   Continuous Infusions: . [MAR Hold] sodium chloride    . [MAR Hold] sodium chloride    . sodium chloride    . lactated ringers 1,000 mL (01/06/18 0656)   PRN Meds: [MAR Hold] sodium chloride, [MAR Hold] acetaminophen **OR** [MAR Hold] acetaminophen, [MAR Hold] diphenhydrAMINE, [MAR Hold]  morphine injection, [MAR Hold] ondansetron **OR** [MAR Hold] ondansetron (ZOFRAN) IV, [MAR Hold] sodium chloride flush   Vital Signs    Vitals:   01/06/18 0809 01/06/18 0810 01/06/18 0815 01/06/18 0820  BP: (!) 103/44 (!) 112/47  (!) 109/47  Pulse: 67 63 64 63  Resp: (!) 23 19 (!) 22 18  Temp:      TempSrc:      SpO2: 97% 99% 99% 100%  Weight:      Height:        Intake/Output Summary (Last 24 hours) at 01/06/2018 0836 Last data filed at 01/06/2018 0808 Gross per 24 hour  Intake 2571.58 ml  Output 750 ml  Net 1821.58 ml   Filed Weights   01/03/18 0959 01/06/18 0652  Weight: 181 lb 3.5 oz (82.2 kg) 181 lb (82.1 kg)    Telemetry    SR with rare PAC and PVC - Personally Reviewed  ECG    No new - Personally Reviewed  Physical Exam   GEN: No acute distress.   Neck: No JVD sitting up Cardiac: RRR, no murmurs but crisp closure of aortic mechanical valve, no rubs, or  gallops.  Respiratory: Clear to auscultation bilaterally. GI: Soft, nontender, non-distended  MS: No edema; No deformity. Neuro:  Nonfocal  Psych: Normal affect   Labs    Chemistry Recent Labs  Lab 01/03/18 0228 01/04/18 0545  NA 139 139  K 4.1 3.7  CL 111 109  CO2 21* 21*  GLUCOSE 159* 188*  BUN 49* 45*  CREATININE 1.03 1.08  CALCIUM 7.9* 8.4*  PROT 5.5* 4.8*  ALBUMIN 2.9* 2.7*  AST 36 30  ALT 22 18  ALKPHOS 34* 32*  BILITOT 0.7 0.4  GFRNONAA >60 >60  GFRAA >60 >60  ANIONGAP 7 9     Hematology Recent Labs  Lab 01/05/18 0505 01/05/18 1941 01/06/18 0528  WBC 14.6* 10.7* 9.7  RBC 2.14* 2.74* 2.47*  HGB 7.0* 8.8* 8.0*  HCT 20.8* 26.4* 23.9*  MCV 97.2 96.4 96.8  MCH 32.7 32.1 32.4  MCHC 33.7 33.3 33.5  RDW 18.9* 18.9* 19.2*  PLT 166 173 157    Cardiac EnzymesNo results for input(s): TROPONINI in the last 168 hours.  Recent Labs  Lab 01/03/18 0234  TROPIPOC 0.00     BNPNo results for input(s): BNP, PROBNP  in the last 168 hours.   DDimer No results for input(s): DDIMER in the last 168 hours.   Radiology    No results found.  Cardiac Studies   Echo 01/03/18  Study Conclusions  - Left ventricle: Wall thickness was increased in a pattern of mild   LVH. Systolic function was normal. The estimated ejection   fraction was in the range of 55% to 60%. The study is not   technically sufficient to allow evaluation of LV diastolic   function. - Aortic valve: DVI .6 suggesting normal AVR function Post St Jude   AVR no perivalvular regurgitations stable bradients Valve area   (VTI): 1.89 cm^2. Valve area (Vmax): 1.51 cm^2. Valve area   (Vmean): 1.6 cm^2. - Mitral valve: Severely calcified annulus. Moderately thickened,   mildly calcified leaflets . There was mild regurgitation. Valve   area by continuity equation (using LVOT flow): 1.94 cm^2. - Left atrium: The atrium was moderately dilated. - Right atrium: The atrium was mildly dilated. - Atrial  septum: No defect or patent foramen ovale was identified. - Pulmonary arteries: PA peak pressure: 43 mm Hg (S).   Patient Profile     84 y.o. male with history of remote CABG and St Jude AVR (2000). PAF.   Now with GI Bleed and syncope   Assessment & Plan    Syncope - has had dizziness in hospital all improved with IV fluids--Hgb on admit 6.8   Melena, had endo today with non bleeding gastric ulcer, erosive gastropathy, duodenal diverticulum.    Acute anemia due to GI blood loss transfused 2 U PRBCs  Today Hgb 8.0 Hct 23.9   Recent diverticulitis treated with ABX.  CAD with hx CABG and St Jude AVR in 2000 on coumadin followed by Dr. Inda Merlin.   "We can keep him without anticoagulation for up to 7 days according to [facility] of cardiology guidelines when mechanical valves are in the aortic position"  ASA stopped  Last dose of coumadin 01/02/18  INR today 1.89 --IV heparin has been started, CBC to be drawn at 1 pm if the hgb drops then colonoscopy.     PAF maintaining SR  On lopressor  HTN was on lopressor and amlodipine.  BP 109/47 to 138/49   HLD on crestor 20   Abnormal EKG on admit due to acute anemia, troponin neg.  Will recheck EKG  Pulmonary HTN with PA pk pressure 43 mmHg   For questions or updates, please contact [place] Please consult [email or web address] for contact info under Cardiology/STEMI.      Signed, Cecilie Kicks, NP  01/06/2018, 8:36 AM    I have seen and examined the patient along with Cecilie Kicks, NP .  I have reviewed the chart, notes and new data.  I agree with NP's note.  Key new complaints: feels well, no complaints. Denies angina and dyspnea Key examination changes: crisp prosthetic valve clicks, no murmurs Key new findings / data: stable Hgb, no further melena or any BRB in stool today. ECG pending   PLAN: Note potential plans for colonoscopy.  Stop ASA permanently. When ready from GI standpoint, resume warfarin and try to keep INR 2.0-2.5  for next few weeks.  Sanda Klein, MD, [place] [phone] 01/06/2018, 12:11 PM

## 2018-01-06 NOTE — Progress Notes (Signed)
[facility] Progress Note  Jason Santana 84 y.o. 06/28/1932  CC:  GI bleed   Subjective:  No acute events overnight. Continues to have intermittent dark stools. Denies abdominal pain, nausea vomiting.   Objective: Vital signs in last 24 hours: Vitals:   01/06/18 0552 01/06/18 0652  BP: (!) 124/58 (!) 138/49  Pulse: 67 67  Resp: 16 13  Temp: 99 F (37.2 C) 98.7 F (37.1 C)  SpO2: 97% 98%    Physical Exam:  General:  Alert, cooperative, no distress, appears stated age  Head:  Normocephalic, without obvious abnormality, atraumatic  Eyes:  , EOM's intact,   Lungs:   Clear to auscultation bilaterally, respirations unlabored  Heart:  Regular rate and rhythm, Murmur   Abdomen:   Soft, non-tender, nondistended, bowel sounds present. No peritoneal signs   Extremities: Extremities normal, atraumatic, no  edema       Lab Results: Recent Labs    01/04/18 0545  NA 139  K 3.7  CL 109  CO2 21*  GLUCOSE 188*  BUN 45*  CREATININE 1.08  CALCIUM 8.4*   Recent Labs    01/04/18 0545  AST 30  ALT 18  ALKPHOS 32*  BILITOT 0.4  PROT 4.8*  ALBUMIN 2.7*   Recent Labs    01/04/18 2129  01/05/18 1941 01/06/18 0528  WBC 16.6*   < > 10.7* 9.7  NEUTROABS 12.5*  --   --   --   HGB 7.3*   < > 8.8* 8.0*  HCT 21.5*   < > 26.4* 23.9*  MCV 96.0   < > 96.4 96.8  PLT 160   < > 173 157   < > = values in this interval not displayed.   Recent Labs    01/05/18 0750 01/06/18 0528  LABPROT 30.6* 21.6*  INR 2.96 1.89      Assessment/Plan: - melena. Patient is complaining of black color stool for last few days. CT scan negative for any acute changes. - acute blood loss anemia. - Aortic valve replacement. On chronic anticoagulation. Coumadin on hold. INR trending down - Centralized abdominal pain. Patient was treated with presumed diverticulitis. CT scan showed no evidence of diverticulitis.abdominal pain resolved.   Recommendations -------------------------- -  EGD today.  Risks (bleeding, infection, bowel perforation that could require surgery, sedation-related changes in cardiopulmonary systems), benefits (identification and possible treatment of source of symptoms, exclusion of certain causes of symptoms), and alternatives (watchful waiting, radiographic imaging studies, empiric medical treatment)  were explained to patient and family in detail and patient wishes to proceed.  Otis Brace MD, [place] 01/06/2018, 7:41 AM  Contact #  [phone]

## 2018-01-06 NOTE — Progress Notes (Signed)
PROGRESS NOTE    Jason Santana   ZOX:[id number]  DOB: 1932-05-12  DOA: 01/03/2018 PCP: [name], MD   Brief Narrative:  Jason Santana 84 year old male with a St Jude's mechanical aortic valve, chronic atrial fibrillation, coronary artery disease status post CABG and hypertension. About 5 or 6 days ago patient noted watery black stool with severe central abdominal pain and nausea slowly the stool less than and then he stopped having bowel movements about 2 days ago.  On the day of admission, he felt fatigued weak lightheaded and passed out and therefore came to the ER. In the ED hemoglobin noted to be 6.8, INR 3.67 and heme positive stool. He was transfused 1 unit of packed red blood cells  Subjective: Status post EGD.  He has no complaints.    Assessment & Plan:   Principal Problem:   Syncope-acute blood loss anemia-orthostatic hypotension -Likely due to volume depletion in setting of GI bleed and anemia -Orthostatic symptoms resolved today -Transfused a second unit on 5/5 for hemoglobin of 7 -Hemoglobin now 8-continue to follow  active Problems: Melena -Bleeding stopped 1 day prior to coming to the hospital but as of yesterday he has begun having dark stools again - GI has been consulted  -He underwent an EGD today which showed a medium sized clean-based gastric ulcer and a few erosions -GI feels that he may have another anomaly in his colon which may be causing his ongoing bleeding as the patient had another black bowel movement last night-recommending to start heparin and consider colonoscopy tomorrow-will await further recommendations -Cardiology has discontinued aspirin and recommends keeping INR 2-2 0.5 the next couple of weeks    Paroxysmal atrial flutter -Continue Lopressor-Coumadin on hold as mentioned -Follow on telemetry  Hypertension -Lopressor being continued to prevent RVR-Norvasc on hold      H/O mechanical aortic valve replacement - see above    CAD  (coronary artery disease) s/p CABG Continue Crestor-he takes 81 mg of aspirin which is on hold  BPH Continue Proscar and Flomax  Hypothyroidism -Continue Synthroid  DVT prophylaxis: INR elevated Code Status: DO NOT RESUSCITATE Family Communication: Wife at bedside Disposition Plan: Following telemetry- Consultants:   GI  Cardiology Procedures:   EGD today Antimicrobials:  Anti-infectives (From admission, onward)   None       Objective: Vitals:   01/06/18 0815 01/06/18 0820 01/06/18 0946 01/06/18 1354  BP:  (!) 109/47 (!) 124/59 (!) 131/57  Pulse: 64 63 69 64  Resp: (!) 22 18 16 18   Temp:   97.9 F (36.6 C) 98.4 F (36.9 C)  TempSrc:   Oral Oral  SpO2: 99% 100% 100% 99%  Weight:      Height:        Intake/Output Summary (Last 24 hours) at 01/06/2018 1410 Last data filed at 01/06/2018 0808 Gross per 24 hour  Intake 1018.58 ml  Output 750 ml  Net 268.58 ml   Filed Weights   01/03/18 0959 01/06/18 0652  Weight: 82.2 kg (181 lb 3.5 oz) 82.1 kg (181 lb)    Examination: General exam: Appears comfortable  HEENT: PERRLA, oral mucosa moist, no sclera icterus or thrush Respiratory system: Clear to auscultation. Respiratory effort normal. Cardiovascular system: S1 & S2 heard, RRR.  Mechanical click heard Gastrointestinal system: Abdomen soft, non-tender, nondistended. Normal bowel sound. No organomegaly Central nervous system: Alert and oriented. No focal neurological deficits. Extremities: No cyanosis, clubbing or edema Skin: No rashes or ulcers Psychiatry:  Mood & affect  appropriate.     Data Reviewed: I have personally reviewed following labs and imaging studies  CBC: Recent Labs  Lab 01/03/18 0228  01/04/18 1445 01/04/18 2129 01/05/18 0505 01/05/18 1941 01/06/18 0528 01/06/18 1212  WBC 14.0*   < > 17.9* 16.6* 14.6* 10.7* 9.7  --   NEUTROABS 10.8*  --   --  12.5*  --   --   --   --   HGB 6.8*   < > 6.5* 7.3* 7.0* 8.8* 8.0* 8.8*  HCT 20.7*   < >  19.1* 21.5* 20.8* 26.4* 23.9* 27.1*  MCV 104.5*   < > 99.5 96.0 97.2 96.4 96.8  --   PLT 170   < > 168 160 166 173 157  --    < > = values in this interval not displayed.   Basic Metabolic Panel: Recent Labs  Lab 01/03/18 0228 01/04/18 0545  NA 139 139  K 4.1 3.7  CL 111 109  CO2 21* 21*  GLUCOSE 159* 188*  BUN 49* 45*  CREATININE 1.03 1.08  CALCIUM 7.9* 8.4*   GFR: Estimated Creatinine Clearance: 53.5 mL/min (by C-G formula based on SCr of 1.08 mg/dL). Liver Function Tests: Recent Labs  Lab 01/03/18 0228 01/04/18 0545  AST 36 30  ALT 22 18  ALKPHOS 34* 32*  BILITOT 0.7 0.4  PROT 5.5* 4.8*  ALBUMIN 2.9* 2.7*   No results for input(s): LIPASE, AMYLASE in the last 168 hours. No results for input(s): AMMONIA in the last 168 hours. Coagulation Profile: Recent Labs  Lab 01/03/18 0315 01/04/18 0545 01/05/18 0750 01/06/18 0528  INR 3.67 4.54* 2.96 1.89   Cardiac Enzymes: No results for input(s): CKTOTAL, CKMB, CKMBINDEX, TROPONINI in the last 168 hours. BNP (last 3 results) No results for input(s): PROBNP in the last 8760 hours. HbA1C: No results for input(s): HGBA1C in the last 72 hours. CBG: No results for input(s): GLUCAP in the last 168 hours. Lipid Profile: No results for input(s): CHOL, HDL, LDLCALC, TRIG, CHOLHDL, LDLDIRECT in the last 72 hours. Thyroid Function Tests: No results for input(s): TSH, T4TOTAL, FREET4, T3FREE, THYROIDAB in the last 72 hours. Anemia Panel: No results for input(s): VITAMINB12, FOLATE, FERRITIN, TIBC, IRON, RETICCTPCT in the last 72 hours. Urine analysis:    Component Value Date/Time   COLORURINE STRAW (A) 01/03/2018 1710   APPEARANCEUR CLEAR 01/03/2018 1710   LABSPEC 1.017 01/03/2018 1710   PHURINE 5.0 01/03/2018 1710   GLUCOSEU 50 (A) 01/03/2018 1710   HGBUR NEGATIVE 01/03/2018 1710   BILIRUBINUR NEGATIVE 01/03/2018 1710   KETONESUR NEGATIVE 01/03/2018 1710   PROTEINUR NEGATIVE 01/03/2018 1710   NITRITE NEGATIVE  01/03/2018 1710   LEUKOCYTESUR NEGATIVE 01/03/2018 1710   Sepsis Labs: @LABRCNTIP (procalcitonin:4,lacticidven:4) )No results found for this or any previous visit (from the past 240 hour(s)).       Radiology Studies: No results found.    Scheduled Meds: . cholecalciferol  2,000 Units Oral Daily  . finasteride  5 mg Oral Daily  . levothyroxine  75 mcg Oral QAC breakfast  . metoprolol tartrate  50 mg Oral BID  . pantoprazole (PROTONIX) IV  40 mg Intravenous Q12H  . rosuvastatin  20 mg Oral Q M,W,F  . saccharomyces boulardii  250 mg Oral BID  . sodium chloride flush  3 mL Intravenous Q12H  . tamsulosin  0.4 mg Oral Daily   Continuous Infusions: . sodium chloride    . sodium chloride    . heparin 1,000 Units/hr (01/06/18 0940)  LOS: 3 days    Time spent in minutes: 35    Debbe Odea, MD [facility] Pager: [email or web address] Password [email or web address] 01/06/2018, 2:10 PM

## 2018-01-06 NOTE — Progress Notes (Signed)
ANTICOAGULATION CONSULT NOTE - Initial Consult  Pharmacy Consult for heparin drip Indication: mechanical aortic valve, on warfarin PTA  Allergies  Allergen Reactions  . Vicodin [Hydrocodone-Acetaminophen]     Severe sensitivity  . Zocor [Simvastatin]     Short memory loss.  [name] [Olmesartan]     Abdominal cramping and increased stools/ irritable bowel  . Codeine Nausea And Vomiting  . Lopid [Gemfibrozil] Other (See Comments)    transaminitis  . Monopril [Fosinopril]     transaminitis  . Phenergan [Promethazine Hcl]     Severe somnolence.  . Prilosec [Omeprazole]     dyspepsia   Patient Measurements: Height: 5\' 10"  (177.8 cm) Weight: 181 lb (82.1 kg) IBW/kg (Calculated) : 73 Heparin Dosing Weight: 82 kg  Vital Signs: Temp: 98.4 F (36.9 C) (05/06 1354) Temp Source: Oral (05/06 1354) BP: 131/57 (05/06 1354) Pulse Rate: 64 (05/06 1354)  Labs: Recent Labs    01/04/18 0545  01/05/18 0750 01/05/18 1941 01/06/18 0528 01/06/18 1212 01/06/18 1742  HGB 7.1*   < >  --  8.8* 8.0* 8.8* 9.9*  HCT 20.9*   < >  --  26.4* 23.9* 27.1* 30.2*  PLT 157   < >  --  173 157  --  191  APTT  --   --   --   --   --  56*  --   LABPROT 42.7*  --  30.6*  --  21.6*  --   --   INR 4.54*  --  2.96  --  1.89  --   --   HEPARINUNFRC  --   --   --   --   --   --  0.46  CREATININE 1.08  --   --   --   --   --   --    < > = values in this interval not displayed.   Estimated Creatinine Clearance: 53.5 mL/min (by C-G formula based on SCr of 1.08 mg/dL).  Medical History: Past Medical History:  Diagnosis Date  . Atrial fibrillation ([place])   . CAD (coronary artery disease) of artery bypass graft 2000   2000 with multiple bypasses   . Chronic anticoagulation    Coumadin therapy for mechanical aortic valve. Postoperative atrial fibrillation.   . Essential hypertension   . H/O mechanical aortic valve replacement 2000   St Jude AVR  . Hypothyroidism   . Premature ventricular contractions     Assessment: Pharmacy consulted to dose and monitor heparin drip in this 84 year old male with a mechanical aortic valve and atrial fibrillation. Patient was taking warfarin PTA and admitted with melena and underwent EGD 5/6. Per GI notes, ok to resume heparin as no evidence of active bleeding.  Per MD, will hold off on warfarin this evening.  INR = 1.89, Hgb 9.9, Plt 191  Today, 01/06/2018 No bolus, begin Heparin infusion at 1000 units/hr 1st Heparin level in desired range, 0.46 units/ml  Goal of Therapy:  Heparin level 0.3-0.5 units/ml (lower target range per GI MD Brahmbhatt) Monitor platelets by anticoagulation protocol: Yes   Plan:  Continue Heparin at 1000 units/hr 2nd Heparin level with am labs Daily CBC while on heparin drip  Minda Ditto, PharmD Clinical Pharmacist 01/06/2018,6:42 PM

## 2018-01-06 NOTE — Op Note (Signed)
[facility] Patient Name: Jason Santana Procedure Date: 01/06/2018 MRN: [id number] Attending MD: Otis Brace , MD Date of Birth: [birth year] CSN: [id number] Age: 84 Admit Type: Inpatient Procedure:                Upper GI endoscopy Indications:              Melena, Suspected upper gastrointestinal bleeding Providers:                Otis Brace, MD, Vista Lawman, RN, Laurena Spies, Technician, Virgia Land, CRNA Referring MD:              Medicines:                Sedation Administered by an Anesthesia Professional Complications:            No immediate complications. Estimated Blood Loss:     Estimated blood loss was minimal. Procedure:                Pre-Anesthesia Assessment:                           - Prior to the procedure, a History and Physical                            was performed, and patient medications and                            allergies were reviewed. The patient's tolerance of                            previous anesthesia was also reviewed. The risks                            and benefits of the procedure and the sedation                            options and risks were discussed with the patient.                            All questions were answered, and informed consent                            was obtained. Prior Anticoagulants: The patient has                            taken Coumadin (warfarin), last dose was 4 days                            prior to procedure. ASA Grade Assessment: III - A                            patient with severe systemic disease. After  reviewing the risks and benefits, the patient was                            deemed in satisfactory condition to undergo the                            procedure.                           After obtaining informed consent, the endoscope was                            passed under direct vision. Throughout the                     procedure, the patient's blood pressure, pulse, and                            oxygen saturations were monitored continuously. The                            EG-[id number] ([id number]) scope was introduced through the                            mouth, and advanced to the third part of duodenum.                            The upper GI endoscopy was accomplished without                            difficulty. The patient tolerated the procedure                            well. Scope In: Scope Out: Findings:      The Z-line was regular and was found 42 cm from the incisors.      The exam of the esophagus was otherwise normal.      One non-bleeding superficial gastric ulcer with no stigmata of bleeding       was found in the prepyloric region of the stomach. Biopsies were taken       with a cold forceps for histology. Initially ulcer appeared linear       behind a thickened fold, fold was separated with the biopsy forceps       revealing medium sized clean-based ulcer.      A few dispersed small erosions were found in the prepyloric region of       the stomach.      The cardia and gastric fundus were normal on retroflexion.      Nodular mucosa was found in the duodenal bulb. Biopsies were taken with       a cold forceps for histology.      The first portion of the duodenum and second portion of the duodenum       were normal.      A medium diverticulum was found in the third portion of the duodenum. Impression:               - Z-line regular,  42 cm from the incisors.                           - Non-bleeding gastric ulcer with no stigmata of                            bleeding. Biopsied.                           - Erosive gastropathy.                           - Nodular mucosa in the duodenal bulb. Biopsied.                           - Normal first portion of the duodenum and second                            portion of the duodenum.                           - Duodenal  diverticulum. Moderate Sedation:      Moderate (conscious) sedation was personally administered by an       anesthesia professional. The following parameters were monitored: oxygen       saturation, heart rate, blood pressure, and response to care. Recommendation:           - Return patient to hospital ward for ongoing care.                           - Full liquid diet. Continue PPI. Monitor H&H.                           - Continue present medications. Procedure Code(s):        --- Professional ---                           [phone], Esophagogastroduodenoscopy, flexible,                            transoral; with biopsy, single or multiple Diagnosis Code(s):        --- Professional ---                           K25.9, Gastric ulcer, unspecified as acute or                            chronic, without hemorrhage or perforation                           K31.89, Other diseases of stomach and duodenum                           K92.1, Melena (includes Hematochezia) CPT copyright 2017 American Medical Association. All rights reserved. The codes documented in this report are preliminary and upon coder review may  be revised to meet current compliance requirements. Abbigayle Toole  Alessandra Bevels, MD Otis Brace, MD 01/06/2018 8:12:47 AM Number of Addenda: 0

## 2018-01-07 DIAGNOSIS — I4892 Unspecified atrial flutter: Secondary | ICD-10-CM

## 2018-01-07 LAB — HEMOGLOBIN AND HEMATOCRIT, BLOOD
HEMATOCRIT: 27.2 % — AB (ref 39.0–52.0)
Hemoglobin: 8.9 g/dL — ABNORMAL LOW (ref 13.0–17.0)

## 2018-01-07 LAB — PROTIME-INR
INR: 1.38
PROTHROMBIN TIME: 16.9 s — AB (ref 11.4–15.2)

## 2018-01-07 LAB — CBC
HCT: 28.1 % — ABNORMAL LOW (ref 39.0–52.0)
Hemoglobin: 8.9 g/dL — ABNORMAL LOW (ref 13.0–17.0)
MCH: 31.1 pg (ref 26.0–34.0)
MCHC: 31.7 g/dL (ref 30.0–36.0)
MCV: 98.3 fL (ref 78.0–100.0)
PLATELETS: 193 10*3/uL (ref 150–400)
RBC: 2.86 MIL/uL — AB (ref 4.22–5.81)
RDW: 19.2 % — AB (ref 11.5–15.5)
WBC: 9.5 10*3/uL (ref 4.0–10.5)

## 2018-01-07 LAB — HEPARIN LEVEL (UNFRACTIONATED)
HEPARIN UNFRACTIONATED: 0.65 [IU]/mL (ref 0.30–0.70)
Heparin Unfractionated: 0.56 IU/mL (ref 0.30–0.70)

## 2018-01-07 MED ORDER — HEPARIN (PORCINE) IN NACL 100-0.45 UNIT/ML-% IJ SOLN: 900.0000 [IU]/h | INTRAMUSCULAR | Status: DC

## 2018-01-07 MED ORDER — PEG 3350-KCL-NA BICARB-NACL 420 G PO SOLR
4000.0000 mL | Freq: Once | ORAL | Status: AC
  Administered 2018-01-07: 4000 mL via ORAL

## 2018-01-07 MED ORDER — WITCH HAZEL-GLYCERIN EX PADS
MEDICATED_PAD | CUTANEOUS | Status: DC | PRN
  Filled 2018-01-07 (×2): qty 100

## 2018-01-07 MED ORDER — HEPARIN (PORCINE) IN NACL 100-0.45 UNIT/ML-% IJ SOLN
1000.0000 [IU]/h | INTRAMUSCULAR | Status: DC
  Administered 2018-01-07: 1000 [IU]/h via INTRAVENOUS
  Filled 2018-01-07: qty 250

## 2018-01-07 MED ORDER — HEPARIN (PORCINE) IN NACL 100-0.45 UNIT/ML-% IJ SOLN: 850.0000 [IU]/h | INTRAMUSCULAR | Status: AC

## 2018-01-07 MED ORDER — HYDROCORTISONE 2.5 % RE CREA
1.0000 "application " | TOPICAL_CREAM | Freq: Four times a day (QID) | RECTAL | Status: DC | PRN
  Filled 2018-01-07 (×2): qty 28.35

## 2018-01-07 NOTE — Care Management Important Message (Signed)
Important Message  Patient Details  Name: Jason Santana MRN: [id number] Date of Birth: 05-30-1932   Medicare Important Message Given:  Yes    Kerin Salen 01/07/2018, 1:47 PMImportant Message  Patient Details  Name: Jason Santana MRN: [id number] Date of Birth: December 21, 1931   Medicare Important Message Given:  Yes    Kerin Salen 01/07/2018, 1:47 PM

## 2018-01-07 NOTE — Progress Notes (Addendum)
Pharmacy: Re- heparin  Patient's an 84 y.o M with hx mechanical aortic valve and afib on warfarin PTA, presented to the ED on 01/03/18 with syncope and melena. Patient was transitioned to heparin inpatient. He had EGD on 5/6 with noted medium size prepyloric ulcer and gastric erosions, no evidence of active bleeding.  Heparin drip resumed after procedure.  - heparin level now back slightly therapeutic at 0.56 (goal 0.3-0.5) - hgb 8.9, plts stable at 193 - Plan for colonoscopy on 01/08/18. GI recom. to hold heparin drip 6 hours prior to procedure with procedure scheduled at 8:30a in the morning  Plan: - Will decrease heparin drip down slightly to 850 units/hr - check 8 hr heparin level (at 0100) - d/c heparin drip at 2:30a for colonoscopy - f/u with plan for [facility] after procedure  Dia Sitter, PharmD, BCPS 01/07/2018 4:23 PM

## 2018-01-07 NOTE — Progress Notes (Signed)
PROGRESS NOTE    Jason Santana   RXV:[id number]  DOB: [birth year]  DOA: 01/03/2018 PCP: [name], MD   Brief Narrative:  Jason Santana 84 year old male with a St Jude's mechanical aortic valve, chronic atrial fibrillation, coronary artery disease status post CABG and hypertension. About 5 or 6 days ago patient noted watery black stool with severe central abdominal pain and nausea slowly the stool less than and then he stopped having bowel movements about 2 days ago.  On the day of admission, he felt fatigued weak lightheaded and passed out and therefore came to the ER. In the ED hemoglobin noted to be 6.8, INR 3.67 and heme positive stool. He was transfused 1 unit of packed red blood cells  Subjective: Brown BM today. No nausea, vomiting, abdominal pain.  Assessment & Plan:   Principal Problem:   Syncope-acute blood loss anemia-orthostatic hypotension -Likely due to volume depletion in setting of GI bleed and anemia -Orthostatic symptoms resolved today -Transfused a second unit on 5/5 for hemoglobin of 7- bringing Hb up to 8- range-  active Problems: Melena - aspirin and coumadin on hold- on heparin infusion -Bleeding stopped 1 day prior to coming to the hospital - had a dark stool on day 2 of admission - GI has been consulted  -5/6> He underwent an EGD today which showed a medium sized clean-based gastric ulcer and a few erosions -GI feels that he may have another anomaly in his colon which may be causing his ongoing bleeding as the patient had another black bowel movement last night-recommending to start heparin and consider colonoscopy tomorrow-will await further recommendations  - 5/7-  he had a non bloody BM yesterday - GI would like to do a Colonoscopy tomorrow    Paroxysmal atrial flutter- chronic anticoagulation -Continue Lopressor-Coumadin on hold as mentioned and on Heparin- cardiology recommends slowly allowing INR to drift up after procedures are done and keeping  INR 2-2.5 -cont Lopressor -Follow on telemetry     H/O mechanical aortic valve replacement - see above in regards to Coumadin  Hypertension -Lopressor being continued to prevent RVR-Norvasc on hold     CAD (coronary artery disease) s/p CABG Continue Crestor-he takes 81 mg of aspirin which is on hold  BPH Continue Proscar and Flomax  Hypothyroidism -Continue Synthroid  DVT prophylaxis: INR elevated Code Status: DO NOT RESUSCITATE Family Communication: Wife at bedside Disposition Plan: Following telemetry- Consultants:   GI  Cardiology Procedures:   EGD today Antimicrobials:  Anti-infectives (From admission, onward)   None       Objective: Vitals:   01/06/18 0946 01/06/18 1354 01/06/18 2146 01/07/18 1346  BP: (!) 124/59 (!) 131/57 (!) 114/54 (!) 120/58  Pulse: 69 64 63 68  Resp: 16 18 18 18   Temp: 97.9 F (36.6 C) 98.4 F (36.9 C) 98.2 F (36.8 C) 97.7 F (36.5 C)  TempSrc: Oral Oral Oral Oral  SpO2: 100% 99% 98% 99%  Weight:      Height:        Intake/Output Summary (Last 24 hours) at 01/07/2018 1654 Last data filed at 01/07/2018 1600 Gross per 24 hour  Intake 1106.15 ml  Output -  Net 1106.15 ml   Filed Weights   01/03/18 0959 01/06/18 0652  Weight: 82.2 kg (181 lb 3.5 oz) 82.1 kg (181 lb)    Examination: General exam: Appears comfortable  HEENT: PERRLA, oral mucosa moist, no sclera icterus or thrush Respiratory system: Clear to auscultation. Respiratory effort normal. Cardiovascular system: S1 &  S2 heard, RRR.  Mechanical click heard Gastrointestinal system: Abdomen soft, non-tender, nondistended. Normal bowel sound. No organomegaly Central nervous system: Alert and oriented. No focal neurological deficits. Extremities: No cyanosis, clubbing or edema Skin: No rashes or ulcers Psychiatry:  Mood & affect appropriate.     Data Reviewed: I have personally reviewed following labs and imaging studies  CBC: Recent Labs  Lab 01/03/18 0228   01/04/18 2129 01/05/18 0505 01/05/18 1941 01/06/18 0528 01/06/18 1212 01/06/18 1742 01/07/18 0543 01/07/18 1550  WBC 14.0*   < > 16.6* 14.6* 10.7* 9.7  --  10.6* 9.5  --   NEUTROABS 10.8*  --  12.5*  --   --   --   --   --   --   --   HGB 6.8*   < > 7.3* 7.0* 8.8* 8.0* 8.8* 9.9* 8.9* 8.9*  HCT 20.7*   < > 21.5* 20.8* 26.4* 23.9* 27.1* 30.2* 28.1* 27.2*  MCV 104.5*   < > 96.0 97.2 96.4 96.8  --  97.4 98.3  --   PLT 170   < > 160 166 173 157  --  191 193  --    < > = values in this interval not displayed.   Basic Metabolic Panel: Recent Labs  Lab 01/03/18 0228 01/04/18 0545  NA 139 139  K 4.1 3.7  CL 111 109  CO2 21* 21*  GLUCOSE 159* 188*  BUN 49* 45*  CREATININE 1.03 1.08  CALCIUM 7.9* 8.4*   GFR: Estimated Creatinine Clearance: 53.5 mL/min (by C-G formula based on SCr of 1.08 mg/dL). Liver Function Tests: Recent Labs  Lab 01/03/18 0228 01/04/18 0545  AST 36 30  ALT 22 18  ALKPHOS 34* 32*  BILITOT 0.7 0.4  PROT 5.5* 4.8*  ALBUMIN 2.9* 2.7*   No results for input(s): LIPASE, AMYLASE in the last 168 hours. No results for input(s): AMMONIA in the last 168 hours. Coagulation Profile: Recent Labs  Lab 01/03/18 0315 01/04/18 0545 01/05/18 0750 01/06/18 0528 01/07/18 0543  INR 3.67 4.54* 2.96 1.89 1.38   Cardiac Enzymes: No results for input(s): CKTOTAL, CKMB, CKMBINDEX, TROPONINI in the last 168 hours. BNP (last 3 results) No results for input(s): PROBNP in the last 8760 hours. HbA1C: No results for input(s): HGBA1C in the last 72 hours. CBG: No results for input(s): GLUCAP in the last 168 hours. Lipid Profile: No results for input(s): CHOL, HDL, LDLCALC, TRIG, CHOLHDL, LDLDIRECT in the last 72 hours. Thyroid Function Tests: No results for input(s): TSH, T4TOTAL, FREET4, T3FREE, THYROIDAB in the last 72 hours. Anemia Panel: No results for input(s): VITAMINB12, FOLATE, FERRITIN, TIBC, IRON, RETICCTPCT in the last 72 hours. Urine analysis:    Component  Value Date/Time   COLORURINE STRAW (A) 01/03/2018 1710   APPEARANCEUR CLEAR 01/03/2018 1710   LABSPEC 1.017 01/03/2018 1710   PHURINE 5.0 01/03/2018 1710   GLUCOSEU 50 (A) 01/03/2018 1710   HGBUR NEGATIVE 01/03/2018 1710   BILIRUBINUR NEGATIVE 01/03/2018 1710   KETONESUR NEGATIVE 01/03/2018 1710   PROTEINUR NEGATIVE 01/03/2018 1710   NITRITE NEGATIVE 01/03/2018 1710   LEUKOCYTESUR NEGATIVE 01/03/2018 1710   Sepsis Labs: @LABRCNTIP (procalcitonin:4,lacticidven:4) )No results found for this or any previous visit (from the past 240 hour(s)).       Radiology Studies: No results found.    Scheduled Meds: . cholecalciferol  2,000 Units Oral Daily  . finasteride  5 mg Oral Daily  . levothyroxine  75 mcg Oral QAC breakfast  . metoprolol tartrate  50 mg Oral BID  . pantoprazole (PROTONIX) IV  40 mg Intravenous Q12H  . rosuvastatin  20 mg Oral Q M,W,F  . saccharomyces boulardii  250 mg Oral BID  . sodium chloride flush  3 mL Intravenous Q12H  . tamsulosin  0.4 mg Oral Daily   Continuous Infusions: . sodium chloride    . sodium chloride    . heparin       LOS: 4 days    Time spent in minutes: 35    Debbe Odea, MD [facility] Pager: [email or web address] Password [email or web address] 01/07/2018, 4:53 PM

## 2018-01-07 NOTE — Progress Notes (Addendum)
Progress Note  Patient Name: Jason Santana Date of Encounter: 01/07/2018  Primary Cardiologist: Belva Crome III, MD   Subjective   No CP or SOB, no more melena. Wonders if he can get out of the hospital. Willing to do Lovenox shots if that will get him home. Occasionally feels the PVCs, has had them for years. They don't bother him.  Inpatient Medications    Scheduled Meds: . cholecalciferol  2,000 Units Oral Daily  . finasteride  5 mg Oral Daily  . levothyroxine  75 mcg Oral QAC breakfast  . metoprolol tartrate  50 mg Oral BID  . pantoprazole (PROTONIX) IV  40 mg Intravenous Q12H  . rosuvastatin  20 mg Oral Q M,W,F  . saccharomyces boulardii  250 mg Oral BID  . sodium chloride flush  3 mL Intravenous Q12H  . tamsulosin  0.4 mg Oral Daily   Continuous Infusions: . sodium chloride    . sodium chloride    . heparin 900 Units/hr (01/07/18 0839)   PRN Meds: sodium chloride, acetaminophen **OR** acetaminophen, diphenhydrAMINE, morphine injection, ondansetron **OR** ondansetron (ZOFRAN) IV, sodium chloride flush   Vital Signs    Vitals:   01/06/18 0820 01/06/18 0946 01/06/18 1354 01/06/18 2146  BP: (!) 109/47 (!) 124/59 (!) 131/57 (!) 114/54  Pulse: 63 69 64 63  Resp: 18 16 18 18   Temp:  97.9 F (36.6 C) 98.4 F (36.9 C) 98.2 F (36.8 C)  TempSrc:  Oral Oral Oral  SpO2: 100% 100% 99% 98%  Weight:      Height:        Intake/Output Summary (Last 24 hours) at 01/07/2018 0920 Last data filed at 01/06/2018 2300 Gross per 24 hour  Intake 1093.33 ml  Output -  Net 1093.33 ml   Filed Weights   01/03/18 0959 01/06/18 0652  Weight: 181 lb 3.5 oz (82.2 kg) 181 lb (82.1 kg)    Telemetry    SR with PVCs and bigeminy - Personally Reviewed  ECG    05/06, SR, HR 64, Anterior ST changes are slightly improved but still present compared to 01/03/2018 ECG, new compared to 08/2016 - Personally Reviewed  Physical Exam   GEN: No acute distress.   Neck: No JVD sitting  up Cardiac: RRR, slight murmur, crisp Aortic valve closure, no rubs, or gallops.  Respiratory: Clear to auscultation bilaterally. GI: Soft, nontender, non-distended  MS: No edema; No deformity. Neuro:  Nonfocal  Psych: Normal affect   Labs    Chemistry Recent Labs  Lab 01/03/18 0228 01/04/18 0545  NA 139 139  K 4.1 3.7  CL 111 109  CO2 21* 21*  GLUCOSE 159* 188*  BUN 49* 45*  CREATININE 1.03 1.08  CALCIUM 7.9* 8.4*  PROT 5.5* 4.8*  ALBUMIN 2.9* 2.7*  AST 36 30  ALT 22 18  ALKPHOS 34* 32*  BILITOT 0.7 0.4  GFRNONAA >60 >60  GFRAA >60 >60  ANIONGAP 7 9     Hematology Recent Labs  Lab 01/06/18 0528 01/06/18 1212 01/06/18 1742 01/07/18 0543  WBC 9.7  --  10.6* 9.5  RBC 2.47*  --  3.10* 2.86*  HGB 8.0* 8.8* 9.9* 8.9*  HCT 23.9* 27.1* 30.2* 28.1*  MCV 96.8  --  97.4 98.3  MCH 32.4  --  31.9 31.1  MCHC 33.5  --  32.8 31.7  RDW 19.2*  --  19.8* 19.2*  PLT 157  --  191 193    Cardiac EnzymesNo results for input(s):  TROPONINI in the last 168 hours.  Recent Labs  Lab 01/03/18 0234  TROPIPOC 0.00    Lab Results  Component Value Date   INR 1.38 01/07/2018   INR 1.89 01/06/2018   INR 2.96 01/05/2018   BNPNo results for input(s): BNP, PROBNP in the last 168 hours.   DDimer No results for input(s): DDIMER in the last 168 hours.   Radiology    No results found.  Cardiac Studies   Echo 01/03/18  Study Conclusions  - Left ventricle: Wall thickness was increased in a pattern of mild   LVH. Systolic function was normal. The estimated ejection   fraction was in the range of 55% to 60%. The study is not   technically sufficient to allow evaluation of LV diastolic   function. - Aortic valve: DVI .6 suggesting normal AVR function Post St Jude   AVR no perivalvular regurgitations stable gradients Valve area   (VTI): 1.89 cm^2. Valve area (Vmax): 1.51 cm^2. Valve area   (Vmean): 1.6 cm^2. - Mitral valve: Severely calcified annulus. Moderately thickened,    mildly calcified leaflets . There was mild regurgitation. Valve   area by continuity equation (using LVOT flow): 1.94 cm^2. - Left atrium: The atrium was moderately dilated. - Right atrium: The atrium was mildly dilated. - Atrial septum: No defect or patent foramen ovale was identified. - Pulmonary arteries: PA peak pressure: 43 mm Hg (S).   Patient Profile     84 y.o. male with history of remote CABG and St Jude AVR (2000). PAF.   Now with GI Bleed and syncope   Assessment & Plan    Syncope - likely 2nd hypovolemia - sx improved with IV fluids--Hgb on admit 6.8   Melena, had endo 05/06 with non bleeding gastric ulcer, erosive gastropathy, duodenal diverticulum. Colonoscopy not planned right now.  --IV heparin has been started, CBC drawn at noon was improved but hgb dropped overnight, per IM/GI, may need colonoscopy.     Acute anemia due to GI blood loss transfused 3 U PRBCs  - Today Hgb 8.9 Hct 28.1  Recent diverticulitis treated with ABX.  CAD with hx CABG and St Jude AVR in 2000 on coumadin followed by Dr. Inda Merlin.   "We can keep him without anticoagulation for up to 7 days according to [facility] of cardiology guidelines when mechanical valves are in the aortic position"  ASA stopped  Last dose of coumadin 01/02/18  - INR today 1.38, on heparin, coumadin not yet restarted - normally goal INR 2.5-3.5, per Dr C, goal INR 2.0-2.5 next few weeks. - do not resume ASA  PAF maintaining SR  On lopressor  HTN was on lopressor and amlodipine. SBP 103-131  HLD on crestor 20   Abnormal EKG on admit due to acute anemia, troponin neg.  EKG improved but still abnormal, no chest pain - has f/u appt w/ Dr Tamala Julian 05/09, he would like to keep that.  Pulmonary HTN with PA pk pressure 43 mmHg   For questions or updates, please contact [place] Please consult [email or web address] for contact info under Cardiology/STEMI.      Signed, Rosaria Ferries, PA-C  01/07/2018, 9:20 AM    I have  seen and examined the patient along with Rosaria Ferries, PA-C .  I have reviewed the chart, notes and new data.  I agree with PA's note.  Key new complaints: no cardiac complaints, no further melena Key examination changes: crisp prosthetic clicks, bigeminy Key new findings / data: INR  1.8, Hgb stable. Persistent, but mild and improving anterior T wave inversions  PLAN: I think it is safer to just restart warfarin and let it gradually reach INR 2.0-2.5, rather than risk re-bleeding with the sudden swings in anticoagulation with intermittent dosing of LMWH. He has an appointment with Dr. Tamala Julian in 2 days, when we can recheck INR. It is unlikely to take longer than another 4-5 days to reach target INR. OK for DC from CV standpoint.  Sanda Klein, MD, [place] [phone] 01/07/2018, 11:20 AM

## 2018-01-07 NOTE — Progress Notes (Signed)
ANTICOAGULATION CONSULT NOTE - Follow Up Consult  Pharmacy Consult for heparin drip Indication: mechanical aortic valve, on warfarin PTA  Allergies  Allergen Reactions  . Vicodin [Hydrocodone-Acetaminophen]     Severe sensitivity  . Zocor [Simvastatin]     Short memory loss.  [name] [Olmesartan]     Abdominal cramping and increased stools/ irritable bowel  . Codeine Nausea And Vomiting  . Lopid [Gemfibrozil] Other (See Comments)    transaminitis  . Monopril [Fosinopril]     transaminitis  . Phenergan [Promethazine Hcl]     Severe somnolence.  . Prilosec [Omeprazole]     dyspepsia   Patient Measurements: Height: 5\' 10"  (177.8 cm) Weight: 181 lb (82.1 kg) IBW/kg (Calculated) : 73 Heparin Dosing Weight: 82 kg  Vital Signs: Temp: 98.2 F (36.8 C) (05/06 2146) Temp Source: Oral (05/06 2146) BP: 114/54 (05/06 2146) Pulse Rate: 63 (05/06 2146)  Labs: Recent Labs    01/05/18 0750  01/06/18 0528 01/06/18 1212 01/06/18 1742 01/07/18 0543  HGB  --    < > 8.0* 8.8* 9.9* 8.9*  HCT  --    < > 23.9* 27.1* 30.2* 28.1*  PLT  --    < > 157  --  191 193  APTT  --   --   --  56*  --   --   LABPROT 30.6*  --  21.6*  --   --  16.9*  INR 2.96  --  1.89  --   --  1.38  HEPARINUNFRC  --   --   --   --  0.46 0.65   < > = values in this interval not displayed.   Estimated Creatinine Clearance: 53.5 mL/min (by C-G formula based on SCr of 1.08 mg/dL).  Medical History: Past Medical History:  Diagnosis Date  . Atrial fibrillation ([place])   . CAD (coronary artery disease) of artery bypass graft 2000   2000 with multiple bypasses   . Chronic anticoagulation    Coumadin therapy for mechanical aortic valve. Postoperative atrial fibrillation.   . Essential hypertension   . H/O mechanical aortic valve replacement 2000   [employer] AVR  . Hypothyroidism   . Premature ventricular contractions    Assessment: Pharmacy consulted to dose and monitor heparin drip in this 84 year old male  with a mechanical aortic valve and atrial fibrillation. Patient was taking warfarin PTA and admitted with melena and underwent EGD 5/6. Per GI notes, ok to resume heparin as no evidence of active bleeding.  Per MD, will hold off on warfarin.  Today, 01/07/2018 - Heparin level supratherapeutic (0.65) on heparin 1000 units/hr - INR 1.38 - CBC: Hgb decreased 8.9, Plts 193 - No bleeding or complications reported  Goal of Therapy:  Heparin level 0.3-0.5 units/ml (lower target range per GI MD Brahmbhatt) Monitor platelets by anticoagulation protocol: Yes   Plan:  - Decrease heparin to 900 units/hr - Check heparin level in 8hrs - Daily CBC while on heparin drip - F/u resuming warfarin  Peggyann Juba, PharmD, BCPS Pager: [phone] 01/07/2018,7:33 AM

## 2018-01-07 NOTE — Progress Notes (Signed)
[facility] Progress Note  Jason Santana 84 y.o. Sep 10, 1931  CC:  GI bleed   Subjective:  . No further bleeding episodes but hemoglobin fluctuating. Patient denies abdominal pain, nausea vomiting.  ROS : negative for chest pain and shortness of breath.   Objective: Vital signs in last 24 hours: Vitals:   01/06/18 2146 01/07/18 1346  BP: (!) 114/54 (!) 120/58  Pulse: 63 68  Resp: 18 18  Temp: 98.2 F (36.8 C) 97.7 F (36.5 C)  SpO2: 98% 99%    Physical Exam:  General:  Alert, cooperative, no distress, appears stated age  Head:  Normocephalic, without obvious abnormality, atraumatic  Eyes:  , EOM's intact,   Lungs:   Clear to auscultation bilaterally, respirations unlabored  Heart:  Regular rate and rhythm, Murmur   Abdomen:   Soft, non-tender, nondistended, bowel sounds present. No peritoneal signs   Extremities: Extremities normal, atraumatic, no  edema       Lab Results: No results for input(s): NA, K, CL, CO2, GLUCOSE, BUN, CREATININE, CALCIUM, MG, PHOS in the last 72 hours. No results for input(s): AST, ALT, ALKPHOS, BILITOT, PROT, ALBUMIN in the last 72 hours. Recent Labs    01/04/18 2129  01/06/18 1742 01/07/18 0543  WBC 16.6*   < > 10.6* 9.5  NEUTROABS 12.5*  --   --   --   HGB 7.3*   < > 9.9* 8.9*  HCT 21.5*   < > 30.2* 28.1*  MCV 96.0   < > 97.4 98.3  PLT 160   < > 191 193   < > = values in this interval not displayed.   Recent Labs    01/06/18 0528 01/07/18 0543  LABPROT 21.6* 16.9*  INR 1.89 1.38      Assessment/Plan: - melena/dark stools. EGD 01/06/2018 showed small clean-based gastric ulcers. No active bleeding.- intermittent bright blood per rectum. - acute blood loss anemia.requiring multiple units of blood transfusion during hospitalization. - Aortic valve replacement. On chronic anticoagulation. Coumadin on hold. INR 1.38 . Currently on heparin drip - Centralized abdominal pain. Patient was treated with presumed  diverticulitis. CT scan showed no evidence of diverticulitis.abdominal pain resolved.   Recommendations -------------------------- - patient's hemoglobin is fluctuating. No overt bleeding on heparin drip. Patient has seen some bright red blood per rectum particularly after constipation. -  Conservative management with discharge home versus inpatient colonoscopy discussed with the patient. Patient prefers inpatient workup. because of need for chronic anticoagulation, I think is reasonable to perform inpatient colonoscopy tomorrow. - Clear liquid diet for now. Nothing by mouth past midnight.  - need to hold heparin drip 6 hours prior to procedure.patient's procedure is scheduled at 8:30 in the morning. - gastric biopsies negative for H. Pylori. - GI will follow  Otis Brace MD, [place] 01/07/2018, 3:52 PM  Contact #  [phone]

## 2018-01-08 ENCOUNTER — Inpatient Hospital Stay (HOSPITAL_COMMUNITY): Payer: Medicare Other | Admitting: Anesthesiology

## 2018-01-08 ENCOUNTER — Encounter (HOSPITAL_COMMUNITY): Payer: Self-pay | Admitting: Anesthesiology

## 2018-01-08 ENCOUNTER — Encounter (HOSPITAL_COMMUNITY)
Admission: EM | Disposition: A | Discharge status: Home / Self Care | Payer: Self-pay | Source: Home / Self Care | Attending: Internal Medicine

## 2018-01-08 HISTORY — PX: COLONOSCOPY WITH PROPOFOL: SHX5780

## 2018-01-08 LAB — CBC
HEMATOCRIT: 24.7 % — AB (ref 39.0–52.0)
Hemoglobin: 8.1 g/dL — ABNORMAL LOW (ref 13.0–17.0)
MCH: 31.6 pg (ref 26.0–34.0)
MCHC: 32.8 g/dL (ref 30.0–36.0)
MCV: 96.5 fL (ref 78.0–100.0)
PLATELETS: 166 10*3/uL (ref 150–400)
RBC: 2.56 MIL/uL — ABNORMAL LOW (ref 4.22–5.81)
RDW: 18.6 % — ABNORMAL HIGH (ref 11.5–15.5)
WBC: 8.5 10*3/uL (ref 4.0–10.5)

## 2018-01-08 LAB — COMPREHENSIVE METABOLIC PANEL
ALT: 32 U/L (ref 17–63)
ANION GAP: 8 (ref 5–15)
AST: 55 U/L — ABNORMAL HIGH (ref 15–41)
Albumin: 2.8 g/dL — ABNORMAL LOW (ref 3.5–5.0)
Alkaline Phosphatase: 50 U/L (ref 38–126)
BUN: 15 mg/dL (ref 6–20)
CHLORIDE: 109 mmol/L (ref 101–111)
CO2: 22 mmol/L (ref 22–32)
CREATININE: 1.18 mg/dL (ref 0.61–1.24)
Calcium: 8 mg/dL — ABNORMAL LOW (ref 8.9–10.3)
GFR calc Af Amer: >60 mL/min (ref >60)
GFR, EST NON AFRICAN AMERICAN: 55 mL/min — AB (ref >60)
Glucose, Bld: 111 mg/dL — ABNORMAL HIGH (ref 65–99)
Potassium: 3.7 mmol/L (ref 3.5–5.1)
Sodium: 139 mmol/L (ref 135–145)
Total Bilirubin: 1 mg/dL (ref 0.3–1.2)
Total Protein: 5.2 g/dL — ABNORMAL LOW (ref 6.5–8.1)

## 2018-01-08 LAB — PROTIME-INR
INR: 1.31
Prothrombin Time: 16.2 seconds — ABNORMAL HIGH (ref 11.4–15.2)

## 2018-01-08 LAB — HEPARIN LEVEL (UNFRACTIONATED): Heparin Unfractionated: 0.55 IU/mL (ref 0.30–0.70)

## 2018-01-08 LAB — MAGNESIUM: MAGNESIUM: 1.8 mg/dL (ref 1.7–2.4)

## 2018-01-08 LAB — PHOSPHORUS: PHOSPHORUS: 3 mg/dL (ref 2.5–4.6)

## 2018-01-08 SURGERY — COLONOSCOPY WITH PROPOFOL
Anesthesia: Monitor Anesthesia Care

## 2018-01-08 MED ORDER — HYDROCORTISONE 2.5 % RE CREA: 1.0000 "application " | TOPICAL_CREAM | Freq: Four times a day (QID) | RECTAL | 0 refills | Status: DC | PRN

## 2018-01-08 MED ORDER — LACTATED RINGERS IV SOLN
INTRAVENOUS | Status: DC
  Administered 2018-01-08: 09:00:00 via INTRAVENOUS

## 2018-01-08 MED ORDER — WITCH HAZEL-GLYCERIN EX PADS: MEDICATED_PAD | CUTANEOUS | 12 refills | Status: DC | PRN

## 2018-01-08 MED ORDER — PROPOFOL 10 MG/ML IV BOLUS
INTRAVENOUS | Status: DC | PRN
  Administered 2018-01-08 (×2): 20 mg via INTRAVENOUS

## 2018-01-08 MED ORDER — PANTOPRAZOLE SODIUM 40 MG PO TBEC: 40.0000 mg | DELAYED_RELEASE_TABLET | Freq: Two times a day (BID) | ORAL | 0 refills | Status: DC

## 2018-01-08 MED ORDER — LIDOCAINE 2% (20 MG/ML) 5 ML SYRINGE
INTRAMUSCULAR | Status: DC | PRN
  Administered 2018-01-08: 100 mg via INTRAVENOUS

## 2018-01-08 MED ORDER — PROPOFOL 500 MG/50ML IV EMUL
INTRAVENOUS | Status: DC | PRN
  Administered 2018-01-08: 120 ug/kg/min via INTRAVENOUS

## 2018-01-08 MED ORDER — PROPOFOL 10 MG/ML IV BOLUS
INTRAVENOUS | Status: AC
  Filled 2018-01-08: qty 60

## 2018-01-08 SURGICAL SUPPLY — 22 items

## 2018-01-08 NOTE — Anesthesia Procedure Notes (Signed)
Date/Time: 01/08/2018 8:47 AM Performed by: Sharlette Dense, CRNA Oxygen Delivery Method: Simple face mask

## 2018-01-08 NOTE — Transfer of Care (Signed)
Immediate Anesthesia Transfer of Care Note  Patient: Jason Santana  Procedure(s) Performed: COLONOSCOPY WITH PROPOFOL (N/A )  Patient Location: Endoscopy Unit  Anesthesia Type:MAC  Level of Consciousness: awake and alert   Airway & Oxygen Therapy: Patient Spontanous Breathing and Patient connected to face mask oxygen  Post-op Assessment: Report given to RN and Post -op Vital signs reviewed and stable  Post vital signs: Reviewed and stable  Last Vitals:  Vitals Value Taken Time  BP    Temp    Pulse    Resp    SpO2      Last Pain:  Vitals:   01/08/18 0814  TempSrc: Oral  PainSc: 0-No pain      Patients Stated Pain Goal: 0 (32/67/12 4580)  Complications: No apparent anesthesia complications

## 2018-01-08 NOTE — Op Note (Signed)
[facility] Patient Name: Jason Santana Procedure Date: 01/08/2018 MRN: [id number] Attending MD: Arta Silence , MD Date of Birth: 12/01/1931 CSN: [id number] Age: 84 Admit Type: Inpatient Procedure:                Colonoscopy Indications:              Hematochezia, Acute post hemorrhagic anemia Providers:                Arta Silence, MD, Cleda Daub, RN, Faraaz Wolin Dalton, Technician Referring MD:             [facility] Medicines:                Monitored Anesthesia Care Complications:            No immediate complications. Estimated Blood Loss:     Estimated blood loss: none. Procedure:                Pre-Anesthesia Assessment:                           - Prior to the procedure, a History and Physical                            was performed, and patient medications and                            allergies were reviewed. The patient's tolerance of                            previous anesthesia was also reviewed. The risks                            and benefits of the procedure and the sedation                            options and risks were discussed with the patient.                            All questions were answered, and informed consent                            was obtained. Prior Anticoagulants: The patient has                            taken no previous anticoagulant or antiplatelet                            agents. ASA Grade Assessment: II - A patient with                            mild systemic disease. After reviewing the risks  and benefits, the patient was deemed in                            satisfactory condition to undergo the procedure.                           After obtaining informed consent, the colonoscope                            was passed under direct vision. Throughout the                            procedure, the patient's blood pressure, pulse, and         oxygen saturations were monitored continuously. The                            EC-[id number] ([id number]) scope was introduced through                            the anus and advanced to the the cecum, identified                            by appendiceal orifice and ileocecal valve. The                            ileocecal valve, appendiceal orifice, and rectum                            were photographed. The entire colon was examined.                            The colonoscopy was performed without difficulty.                            The patient tolerated the procedure well. The                            quality of the bowel preparation was adequate. Scope In: 8:55:51 AM Scope Out: 9:11:12 AM Scope Withdrawal Time: 0 hours 10 minutes 14 seconds  Total Procedure Duration: 0 hours 15 minutes 21 seconds  Findings:      Hemorrhoids were found on perianal exam.      Internal hemorrhoids were found during retroflexion. The hemorrhoids       were moderate.      No additional abnormalities were found on retroflexion.      A few medium-mouthed diverticula were found in the sigmoid colon and       descending colon.      Colon otherwise normal; no other polyps, masses, vascular ectasias, or       inflammatory changes were seen.      No old or fresh blood was seen to the extent of our examination. Impression:               - Hemorrhoids found on perianal exam.                           -  Internal hemorrhoids.                           - Diverticulosis in the sigmoid colon and in the                            descending colon.                           - The exam was otherwise normal to the cecum.                            Hemorrhoids could cause some sporadic blood in                            stool. No source of anemia seen on colonoscopy;                            possibly chronic disease +/- ulcer? Moderate Sedation:      N/A- Per Anesthesia Care Recommendation:           - Return  patient to hospital ward for ongoing care.                           - Soft diet today.                           - Continue present medications.                           - Pantoprazole 40 mg po bid x 6 weeks, then 40 mg                            po qd thereafter. Given history GI bleeding, age >                            25, and chronic anticoagulation, will need                            indefinite PPI.                           - OK to resume anticoagulation from GI perspective.                           - Topical therapies (e.g., Preparation-H,                            Analpram-HC, Anusol-HC) as needed for hemorrhoids.                           - [facility] will sign-off; we can arrange outpatient                            follow-up with [place]; please call with any questions;  thank you for the consultation. Procedure Code(s):        --- Professional ---                           [phone], Colonoscopy, flexible; diagnostic, including                            collection of specimen(s) by brushing or washing,                            when performed (separate procedure) Diagnosis Code(s):        --- Professional ---                           K64.8, Other hemorrhoids                           K92.1, Melena (includes Hematochezia)                           D62, Acute posthemorrhagic anemia                           K57.30, Diverticulosis of large intestine without                            perforation or abscess without bleeding CPT copyright 2017 [employer]. All rights reserved. The codes documented in this report are preliminary and upon coder review may  be revised to meet current compliance requirements. Arta Silence, MD 01/08/2018 9:22:26 AM This report has been signed electronically. Number of Addenda: 0

## 2018-01-08 NOTE — Care Management Note (Signed)
Case Management Note  Patient Details  Name: Jason Santana MRN: [id number] Date of Birth: Apr 21, 1932  Subjective/Objective:                    Action/Plan:d/c home.   Expected Discharge Date:  01/08/18               Expected Discharge Plan:  Home/Self Care  In-House Referral:     Discharge planning Services  CM Consult  Post Acute Care Choice:    Choice offered to:     DME Arranged:    DME Agency:     HH Arranged:    HH Agency:     Status of Service:  Completed, signed off  If discussed at [employer] of Stay Meetings, dates discussed:    Additional Comments:  Dessa Phi, RN 01/08/2018, 1:24 PM

## 2018-01-08 NOTE — Discharge Summary (Signed)
Physician Discharge Summary  Jason Santana PPI:[id number] DOB: [birth year] DOA: 01/03/2018  PCP: [name], MD  Admit date: 01/03/2018 Discharge date: 01/08/2018  Admitted From: Home Disposition: Home  Recommendations for Outpatient Follow-up:  1. Follow up with PCP in 1-2 weeks 2. Follow-up with cardiology Dr. Pernell Dupre tomorrow 01/09/18 3. Follow with you Gastroenterology as an outpatient 4. Please obtain CMP/CBC, Mag, Phos in one week 5. Please follow up on the following pending results:  Home Health: No Equipment/Devices: None  Discharge Condition: Stable  CODE STATUS: FULL CODE Diet recommendation:   Brief/Interim Summary: Jason Santana 84 year old male with a [employer] mechanical aortic valve, chronic atrial fibrillation, coronary artery disease status post CABG and hypertension and other comorbidities. About 5 or 6 days ago patient noted watery black stool with severe central abdominal pain and nausea slowly the stool less than and then he stopped having bowel movements about 2 days ago.  On the day of admission, he felt fatigued weak lightheaded and passed out and therefore came to the ER.  In the ED hemoglobin noted to be 6.8, INR 3.67 and heme positive stool. He was treated for his syncope and melena.  During the course of the hospitalization he was transfused 4 unit of packed red blood cells.  Cardiology as well as gastroenterology evaluated the patient.  Gastroenterology took the patient for an EGD as well as a colonoscopy.  EGD showed a nonbleeding ulcer with along with erosive gastropathy and a colonoscopy showed hemorrhoids.  After discussion with Cardiology and Gastroenterology patient was reinitiated back on his home Coumadin dose without a bridge.  Patient is to follow-up with his Primary Cardiologist in the a.m. and had his INR rechecked.  She was deemed medically stable to be discharged home as he had no more episodes of bleeding or syncopal episodes.  Discharge Diagnoses:   Principal Problem:   Syncope Active Problems:   Paroxysmal atrial flutter (HCC)   Essential hypertension   H/O mechanical aortic valve replacement   Chronic anticoagulation   GI bleed   CAD (coronary artery disease) s/p CABG  Syncope likely from Acute blood loss anemia and Orthostatic hypotension -Likely due to volume depletion in setting of GI bleed and anemia as Hb on Admisson was 6.8 -Orthostatic symptoms resolved yesterday  -S/p Transfusion of 4 units of pBC's (2 units on 5/3, 1 unit on 5/4, and 1 unit 5/5) -Hb/Hct Stable and no more syncopal episodes -Deemed stable to discharge home and follow-up with PCP as an outpatient and resume anticoagulation without Lovenox bridge per Cardiology recommendation  Melena -Aspirin has been Discontinued per Cardiology Recommendations  -Bleeding stopped 1 day prior to coming to the hospital- had adark stool on day 2 of admission -GI was consulted and on -01/06/18 he underwent an EGD which showed a medium sized clean-based gastric ulcer and a few erosions -GI feels that he may have another anomaly in his colon which may be causing his ongoing bleeding as the patient had another black bowel movement the night prior -Recommended Colonoscopy and starting Heparin gtt -On 5/7- he had a non bloody BM yesterday -GI did Colonoscopy today and found Hemorrhoids; Recommending BID PPI x 6weeks then once daily and ok to resume Anticoagulation -Hb/Hct Stable at 8.1/24.7 -Continue to monitor for signs and symptoms of bleeding and repeat CBC as an outpatient  Paroxysmal atrial flutter-chronic anticoagulation -Continue Metoprolol 50 mg po BID and resume Home Coumadin without Bridge per Cardiology -Cardiology recommends slowly allowing INR to drift  up after procedures are done and keeping INR 2-2.5; INR today was 1.31 -Follow up with Cardiology in AM   H/O mechanical aortic valve replacement -See abovein regards to Coumadin  Hypertension -Continue  home Amlodipine 5 mg po Daily as well as Metoprolol Tartrate 50 mg po BID  CAD (coronary artery disease) s/p CABG -Continue with Metoprolol Tartrate 50 mg p.o. twice daily, Simvastatin 20 mg 3 times weekly; aspirin has been this continued given the recommendations of Cardiology -Resume home Coumadin without bridge per cardiology recommendation -Follow-up with Cardiology Dr. Pernell Dupre in a.m.  BPH -Continue with Finasteride 5 mg p.o. daily as well as Tamsulosin 0.4 mg p.o. daily  Hypothyroidism -Continue home Levothyroxine 75 mcg p.o. daily  Hemorrhoids -Noted to have internal and External hemorrhoids on Colonoscopy -Continue with symptomatic treatment with topical therapies such as Preparation H and Tucks wipes for hemorrhoids  HLD -Continue with Simvastatin 20 mg 3 times weekly  Discharge Instructions Discharge Instructions    Call MD for:  difficulty breathing, headache or visual disturbances   Complete by:  As directed    Call MD for:  extreme fatigue   Complete by:  As directed    Call MD for:  hives   Complete by:  As directed    Call MD for:  persistant dizziness or light-headedness   Complete by:  As directed    Call MD for:  persistant nausea and vomiting   Complete by:  As directed    Call MD for:  redness, tenderness, or signs of infection (pain, swelling, redness, odor or green/yellow discharge around incision site)   Complete by:  As directed    Call MD for:  severe uncontrolled pain   Complete by:  As directed    Call MD for:  temperature >100.4   Complete by:  As directed    Diet - low sodium heart healthy   Complete by:  As directed    Discharge instructions   Complete by:  As directed    Follow-up with primary care physician, gastroenterology, as well as cardiology as an outpatient setting.  Patient has an appointment with Dr. Pernell Dupre tomorrow on 01/09/2018.  Take all medications as prescribed.  Do not resume aspirin per cardiology recommendations and  continue Coumadin at current dosing. Also per GI recommendations continue.resolve 40 mg p.o. twice daily for 6 weeks and then 40 mg daily afterwards. If symptoms change or worsen please return to the emergency room for evaluation   Increase activity slowly   Complete by:  As directed      Allergies as of 01/08/2018      Reactions   Vicodin [hydrocodone-acetaminophen]    Severe sensitivity   Zocor [simvastatin]    Short memory loss.   Benicar [olmesartan]    Abdominal cramping and increased stools/ irritable bowel   Codeine Nausea And Vomiting   Lopid [gemfibrozil] Other (See Comments)   transaminitis   Monopril [fosinopril]    transaminitis   Phenergan [promethazine Hcl]    Severe somnolence.   Prilosec [omeprazole]    dyspepsia      Medication List    STOP taking these medications   aspirin EC 81 MG tablet   cefpodoxime 200 MG tablet Commonly known as:  VANTIN   ondansetron 4 MG tablet Commonly known as:  ZOFRAN     TAKE these medications   amLODipine 5 MG tablet Commonly known as:  NORVASC Take 5 mg by mouth daily before breakfast.   amoxicillin-clavulanate  500-125 MG tablet Commonly known as:  AUGMENTIN Take 1 tablet by mouth 2 (two) times daily.   cholecalciferol 1000 units tablet Commonly known as:  VITAMIN D Take 2,000 Units by mouth daily.   COUMADIN 5 MG tablet Generic drug:  warfarin Take 5-7.5 mg by mouth See admin instructions. 5 mg every Monday and Friday and 7.5 mg on all other days   finasteride 5 MG tablet Commonly known as:  PROSCAR Take 5 mg by mouth daily.   hydrocortisone 2.5 % rectal cream Commonly known as:  ANUSOL-HC Apply 1 application topically 4 (four) times daily as needed for hemorrhoids.   Iron Tabs Take 1 tablet by mouth daily. Pt not sure of dosage   levothyroxine 75 MCG tablet Commonly known as:  SYNTHROID, LEVOTHROID Take 75 mcg by mouth daily before breakfast.   metoprolol tartrate 50 MG tablet Commonly known as:   LOPRESSOR Take 1 tablet (50 mg total) by mouth 2 (two) times daily.   pantoprazole 40 MG tablet Commonly known as:  PROTONIX Take 1 tablet (40 mg total) by mouth 2 (two) times daily.   PRESERVISION AREDS PO Take 1 tablet by mouth 2 (two) times daily.   rosuvastatin 20 MG tablet Commonly known as:  CRESTOR Take 20 mg by mouth daily. 3 times a week   saccharomyces boulardii 250 MG capsule Commonly known as:  FLORASTOR Take 1 capsule (250 mg total) by mouth 2 (two) times daily.   tamsulosin 0.4 MG Caps capsule Commonly known as:  FLOMAX Take 0.4 mg by mouth daily.   witch hazel-glycerin pad Commonly known as:  TUCKS Apply topically as needed for itching.      Follow-up Information    [name], MD. Call.   Specialty:  Internal Medicine Why:  Follow up within 1 week  Contact information: [address] [place] [address] [phone]        Belva Crome, MD .   Specialty:  Cardiology Contact information: [phone] [address]. [address] [place] [address] [phone]        Gastroenterology, [place]. Call.   Why:  Follow up in 1-2 weeks  Contact information: [address] [place] [address] [phone]          Allergies  Allergen Reactions  . Vicodin [Hydrocodone-Acetaminophen]     Severe sensitivity  . Zocor [Simvastatin]     Short memory loss.  [name] [Olmesartan]     Abdominal cramping and increased stools/ irritable bowel  . Codeine Nausea And Vomiting  . Lopid [Gemfibrozil] Other (See Comments)    transaminitis  . Monopril [Fosinopril]     transaminitis  . Phenergan [Promethazine Hcl]     Severe somnolence.  . Prilosec [Omeprazole]     dyspepsia   Consultations:  Cardiology  Gastroenterology  Procedures/Studies: Ct Abdomen Pelvis W Contrast  Result Date: 01/02/2018 CLINICAL DATA:  84 year old male with upper abdominal pain, severe nausea and weight loss. She has also had 2 episodes of  bloody stools. EXAM: CT ABDOMEN AND PELVIS WITH CONTRAST TECHNIQUE: Multidetector CT imaging of the abdomen and pelvis was performed using the standard protocol following bolus administration of intravenous contrast. CONTRAST:  161mL ISOVUE-300 IOPAMIDOL (ISOVUE-300) INJECTION 61% COMPARISON:  Prior CT scan of the abdomen and pelvis 05/26/2017 FINDINGS: Lower chest: Paraseptal pulmonary emphysema. Incompletely imaged prosthetic aortic valve. The heart is normal in size. No pericardial effusion. Unremarkable visualized distal thoracic esophagus. Hepatobiliary: No focal liver abnormality is seen.  Status post cholecystectomy. No biliary dilatation. Pancreas: Unremarkable. No pancreatic ductal dilatation or surrounding inflammatory changes. Spleen: Normal in size without focal abnormality. Adrenals/Urinary Tract: Normal adrenal glands. No enhancing renal lesion, hydronephrosis or nephrolithiasis. Circumscribed low-attenuation lesions present bilaterally. The largest measures 1.6 cm and is consistent with a simple cyst. The smaller lesions are too small to characterize but also statistically highly likely benign cysts. Stomach/Bowel: Colonic diverticular disease without CT evidence of active inflammation. No evidence of obstruction or focal bowel wall thickening. Normal appendix in the right lower quadrant. The terminal ileum is unremarkable. Vascular/Lymphatic: Atherosclerotic calcifications present throughout the abdominal aorta. Probable moderate focal stenosis of the origin of the superior mesenteric artery secondary to calcified atherosclerotic plaque. The celiac artery is widely patent. The IMA remains patent. No aneurysm or dissection. Reproductive: Prostate is unremarkable. Other: No abdominal wall hernia or abnormality. No abdominopelvic ascites. Musculoskeletal: No acute fracture or aggressive appearing lytic or blastic osseous lesion. Incompletely imaged healed median sternotomy. IMPRESSION: 1. No acute  abnormality within the abdomen or pelvis. 2. Calcified atherosclerotic plaque likely results in at least moderate stenosis of the origin of the superior mesenteric artery. The celiac artery remains widely patent as does the IMA. 3. Colonic diverticular disease without CT evidence of active inflammation. 4. Surgical changes of prior cholecystectomy. 5.  Aortic Atherosclerosis (ICD10-170.0) 6.  Emphysema (ICD10-J43.9). Electronically Signed   By: Jacqulynn Cadet M.D.   On: 01/02/2018 14:27   EGD 01/06/18 Findings:      The Z-line was regular and was found 42 cm from the incisors.      The exam of the esophagus was otherwise normal.      One non-bleeding superficial gastric ulcer with no stigmata of bleeding       was found in the prepyloric region of the stomach. Biopsies were taken       with a cold forceps for histology. Initially ulcer appeared linear       behind a thickened fold, fold was separated with the biopsy forceps       revealing medium sized clean-based ulcer.      A few dispersed small erosions were found in the prepyloric region of       the stomach.      The cardia and gastric fundus were normal on retroflexion.      Nodular mucosa was found in the duodenal bulb. Biopsies were taken with       a cold forceps for histology.      The first portion of the duodenum and second portion of the duodenum       were normal.      A medium diverticulum was found in the third portion of the duodenum. Impression:               - Z-line regular, 42 cm from the incisors.                           - Non-bleeding gastric ulcer with no stigmata of                            bleeding. Biopsied.                           - Erosive gastropathy.                           -  Nodular mucosa in the duodenal bulb. Biopsied.                           - Normal first portion of the duodenum and second                            portion of the duodenum.                           - Duodenal  diverticulum.  COLONOSCOPY 01/08/18 Findings:      Hemorrhoids were found on perianal exam.      Internal hemorrhoids were found during retroflexion. The hemorrhoids       were moderate.      No additional abnormalities were found on retroflexion.      A few medium-mouthed diverticula were found in the sigmoid colon and       descending colon.      Colon otherwise normal; no other polyps, masses, vascular ectasias, or       inflammatory changes were seen.      No old or fresh blood was seen to the extent of our examination. Impression:               - Hemorrhoids found on perianal exam.                           - Internal hemorrhoids.                           - Diverticulosis in the sigmoid colon and in the                            descending colon.                           - The exam was otherwise normal to the cecum.                            Hemorrhoids could cause some sporadic blood in                            stool. No source of anemia seen on colonoscopy;                            possibly chronic disease +/- ulcer?  Subjective: Seen and examined at bedside and was improved.  Had no complaints and denied any abdominal pain.  Denies any chest pain, shortness breath, nausea, vomiting.  Denied any more bloody bowel movement.  Felt well and ready to go home.  Discharge Exam: Vitals:   01/08/18 0930 01/08/18 1000  BP: (!) 115/45 (!) 127/47  Pulse: 68 68  Resp: 17 18  Temp:  (!) 97.4 F (36.3 C)  SpO2: 100% 100%   Vitals:   01/08/18 0917 01/08/18 0920 01/08/18 0930 01/08/18 1000  BP: (!) 108/51 (!) 117/46 (!) 115/45 (!) 127/47  Pulse: 61 66 68 68  Resp: 19 18 17 18   Temp: 98.3 F (36.8 C)   (!) 97.4 F (36.3 C)  TempSrc: Oral  Oral  SpO2: 100% 99% 100% 100%  Weight:      Height:       General: Pt is alert, awake, not in acute distress Cardiovascular: RRR, S1/S2 +, no rubs, no gallops; Has a murmur Respiratory: CTA bilaterally, no wheezing, no  rhonchi Abdominal: Soft, NT, ND, bowel sounds + Extremities: no LE edema, no cyanosis  The results of significant diagnostics from this hospitalization (including imaging, microbiology, ancillary and laboratory) are listed below for reference.    Microbiology: No results found for this or any previous visit (from the past 240 hour(s)).   Labs: BNP (last 3 results) No results for input(s): BNP in the last 8760 hours. Basic Metabolic Panel: Recent Labs  Lab 01/03/18 0228 01/04/18 0545 01/08/18 0112  NA 139 139 139  K 4.1 3.7 3.7  CL 111 109 109  CO2 21* 21* 22  GLUCOSE 159* 188* 111*  BUN 49* 45* 15  CREATININE 1.03 1.08 1.18  CALCIUM 7.9* 8.4* 8.0*  MG  --   --  1.8  PHOS  --   --  3.0   Liver Function Tests: Recent Labs  Lab 01/03/18 0228 01/04/18 0545 01/08/18 0112  AST 36 30 55*  ALT 22 18 32  ALKPHOS 34* 32* 50  BILITOT 0.7 0.4 1.0  PROT 5.5* 4.8* 5.2*  ALBUMIN 2.9* 2.7* 2.8*   No results for input(s): LIPASE, AMYLASE in the last 168 hours. No results for input(s): AMMONIA in the last 168 hours. CBC: Recent Labs  Lab 01/03/18 0228  01/04/18 2129  01/05/18 1941 01/06/18 0528 01/06/18 1212 01/06/18 1742 01/07/18 0543 01/07/18 1550 01/08/18 0112  WBC 14.0*   < > 16.6*   < > 10.7* 9.7  --  10.6* 9.5  --  8.5  NEUTROABS 10.8*  --  12.5*  --   --   --   --   --   --   --   --   HGB 6.8*   < > 7.3*   < > 8.8* 8.0* 8.8* 9.9* 8.9* 8.9* 8.1*  HCT 20.7*   < > 21.5*   < > 26.4* 23.9* 27.1* 30.2* 28.1* 27.2* 24.7*  MCV 104.5*   < > 96.0   < > 96.4 96.8  --  97.4 98.3  --  96.5  PLT 170   < > 160   < > 173 157  --  191 193  --  166   < > = values in this interval not displayed.   Cardiac Enzymes: No results for input(s): CKTOTAL, CKMB, CKMBINDEX, TROPONINI in the last 168 hours. BNP: Invalid input(s): POCBNP CBG: No results for input(s): GLUCAP in the last 168 hours. D-Dimer No results for input(s): DDIMER in the last 72 hours. Hgb A1c No results for  input(s): HGBA1C in the last 72 hours. Lipid Profile No results for input(s): CHOL, HDL, LDLCALC, TRIG, CHOLHDL, LDLDIRECT in the last 72 hours. Thyroid function studies No results for input(s): TSH, T4TOTAL, T3FREE, THYROIDAB in the last 72 hours.  Invalid input(s): FREET3 Anemia work up No results for input(s): VITAMINB12, FOLATE, FERRITIN, TIBC, IRON, RETICCTPCT in the last 72 hours. Urinalysis    Component Value Date/Time   COLORURINE STRAW (A) 01/03/2018 1710   APPEARANCEUR CLEAR 01/03/2018 1710   LABSPEC 1.017 01/03/2018 1710   PHURINE 5.0 01/03/2018 1710   GLUCOSEU 50 (A) 01/03/2018 1710   HGBUR NEGATIVE 01/03/2018 1710   BILIRUBINUR NEGATIVE 01/03/2018 1710   KETONESUR NEGATIVE 01/03/2018 1710  PROTEINUR NEGATIVE 01/03/2018 1710   NITRITE NEGATIVE 01/03/2018 1710   LEUKOCYTESUR NEGATIVE 01/03/2018 1710   Sepsis Labs Invalid input(s): PROCALCITONIN,  WBC,  LACTICIDVEN Microbiology No results found for this or any previous visit (from the past 240 hour(s)).  Time coordinating discharge: 35 minutes  SIGNED:  Kerney Elbe, DO [facility] 01/08/2018, 7:32 PM Pager [phone]  If 7PM-7AM, please contact night-coverage [email or web address] Password [email or web address]

## 2018-01-08 NOTE — Progress Notes (Signed)
Patient discharged home with wife as ordered. Discharge paperwork explained to pt who verbalized understanding. All belongings sent with pt. Pt aware of where to pick up his electronic prescriptions. Pt assisted to exit via W/C with nursing assist.

## 2018-01-08 NOTE — Anesthesia Postprocedure Evaluation (Signed)
Anesthesia Post Note  Patient: Jason Santana  Procedure(s) Performed: COLONOSCOPY WITH PROPOFOL (N/A )     Patient location during evaluation: PACU Anesthesia Type: MAC Level of consciousness: awake Pain management: pain level controlled Vital Signs Assessment: post-procedure vital signs reviewed and stable Respiratory status: spontaneous breathing Cardiovascular status: stable Anesthetic complications: no    Last Vitals:  Vitals:   01/08/18 0930 01/08/18 1000  BP: (!) 115/45 (!) 127/47  Pulse: 68 68  Resp: 17 18  Temp:  (!) 36.3 C  SpO2: 100% 100%    Last Pain:  Vitals:   01/08/18 1215  TempSrc:   PainSc: 0-No pain                 Carrissa Taitano

## 2018-01-08 NOTE — Anesthesia Preprocedure Evaluation (Addendum)
Anesthesia Evaluation  Patient identified by MRN, date of birth, ID band Patient awake    Reviewed: Allergy & Precautions, NPO status , Patient's Chart, lab work & pertinent test results  Airway Mallampati: II  TM Distance: >3 FB     Dental   Pulmonary former smoker,    breath sounds clear to auscultation       Cardiovascular hypertension, + CAD   Rhythm:Regular Rate:Normal     Neuro/Psych    GI/Hepatic Neg liver ROS, GI history noted CG   Endo/Other  Hypothyroidism   Renal/GU negative Renal ROS     Musculoskeletal   Abdominal   Peds  Hematology   Anesthesia Other Findings   Reproductive/Obstetrics                             Anesthesia Physical Anesthesia Plan  ASA: II  Anesthesia Plan: MAC   Post-op Pain Management:    Induction: Intravenous  PONV Risk Score and Plan: 1 and Treatment may vary due to age or medical condition and Midazolam  Airway Management Planned:   Additional Equipment:   Intra-op Plan:   Post-operative Plan:   Informed Consent: I have reviewed the patients History and Physical, chart, labs and discussed the procedure including the risks, benefits and alternatives for the proposed anesthesia with the patient or authorized representative who has indicated his/her understanding and acceptance.   Dental advisory given  Plan Discussed with: CRNA and Anesthesiologist  Anesthesia Plan Comments:         Anesthesia Quick Evaluation

## 2018-01-08 NOTE — Progress Notes (Signed)
ANTICOAGULATION CONSULT NOTE - Follow Up Consult  Pharmacy Consult for heparin Indication: mechanical aortic valve, on warfarin PTA   Allergies  Allergen Reactions  . Vicodin [Hydrocodone-Acetaminophen]     Severe sensitivity  . Zocor [Simvastatin]     Short memory loss.  [name] [Olmesartan]     Abdominal cramping and increased stools/ irritable bowel  . Codeine Nausea And Vomiting  . Lopid [Gemfibrozil] Other (See Comments)    transaminitis  . Monopril [Fosinopril]     transaminitis  . Phenergan [Promethazine Hcl]     Severe somnolence.  . Prilosec [Omeprazole]     dyspepsia    Patient Measurements: Height: 5\' 10"  (177.8 cm) Weight: 181 lb (82.1 kg) IBW/kg (Calculated) : 73 Heparin Dosing Weight:   Vital Signs: Temp: 98.4 F (36.9 C) (05/07 2134) Temp Source: Oral (05/07 2134) BP: 131/67 (05/07 2134) Pulse Rate: 74 (05/07 2134)  Labs: Recent Labs    01/06/18 0528 01/06/18 1212  01/06/18 1742 01/07/18 0543 01/07/18 1550 01/08/18 0112  HGB 8.0* 8.8*  --  9.9* 8.9* 8.9* 8.1*  HCT 23.9* 27.1*  --  30.2* 28.1* 27.2* 24.7*  PLT 157  --   --  191 193  --  166  APTT  --  56*  --   --   --   --   --   LABPROT 21.6*  --   --   --  16.9*  --  16.2*  INR 1.89  --   --   --  1.38  --  1.31  HEPARINUNFRC  --   --    < > 0.46 0.65 0.56 0.55   < > = values in this interval not displayed.    Estimated Creatinine Clearance: 53.5 mL/min (by C-G formula based on SCr of 1.08 mg/dL).   Medications:  Infusions:  . sodium chloride    . sodium chloride      Assessment: Patient with heparin level above the reduced goal, but slightly trending down on same heparin rate.  Heparin to be held about 19mins after level, therefore no rate change, but will advise to consider when/if heparin is restarted. Heparin turned off at 0230, as per prior order.  Goal of Therapy:  Heparin level 0.3-0.5 units/ml Monitor platelets by anticoagulation protocol: Yes   Plan:  F/u heparin  plan after procedure  Nani Skillern Crowford 01/08/2018,4:15 AM

## 2018-01-08 NOTE — Interval H&P Note (Signed)
History and Physical Interval Note:  01/08/2018 8:40 AM  Jason Santana  has presented today for surgery, with the diagnosis of GI bleed, anemia  The various methods of treatment have been discussed with the patient and family. After consideration of risks, benefits and other options for treatment, the patient has consented to  Procedure(s): COLONOSCOPY WITH PROPOFOL (N/A) as a surgical intervention .  The patient's history has been reviewed, patient examined, no change in status, stable for surgery.  I have reviewed the patient's chart and labs.  Questions were answered to the patient's satisfaction.     [name]

## 2018-01-09 ENCOUNTER — Ambulatory Visit (INDEPENDENT_AMBULATORY_CARE_PROVIDER_SITE_OTHER): Payer: Medicare Other | Admitting: Interventional Cardiology

## 2018-01-09 ENCOUNTER — Encounter (HOSPITAL_COMMUNITY): Payer: Self-pay | Admitting: Gastroenterology

## 2018-01-09 VITALS — BP 130/70 | HR 44 | Ht 70.0 in | Wt 193.2 lb

## 2018-01-09 DIAGNOSIS — Z7901 Long term (current) use of anticoagulants: Secondary | ICD-10-CM | POA: Diagnosis not present

## 2018-01-09 DIAGNOSIS — I25708 Atherosclerosis of coronary artery bypass graft(s), unspecified, with other forms of angina pectoris: Secondary | ICD-10-CM

## 2018-01-09 DIAGNOSIS — K921 Melena: Secondary | ICD-10-CM | POA: Diagnosis not present

## 2018-01-09 DIAGNOSIS — I4892 Unspecified atrial flutter: Secondary | ICD-10-CM | POA: Diagnosis not present

## 2018-01-09 DIAGNOSIS — Z952 Presence of prosthetic heart valve: Secondary | ICD-10-CM

## 2018-01-09 NOTE — Progress Notes (Signed)
Cardiology Office Note    Date:  01/09/2018   ID:  Jason Santana, DOB May 04, 1932, MRN [id number]  PCP:  Jason Huddle, MD  Cardiologist: Jason Grooms, MD   No chief complaint on file.   History of Present Illness:  Jason Santana is a 84 y.o. male who presents for valvular heart disease with prior mechanical aortic valve replacement (2000), chronic atrial fibrillation,, chronic anticoagulation therapy, and coronary artery disease.  Recent lower GI bleed May 2019 required pausing of anticoagulation therapy.  Jason Santana had an upper GI bleed that was identified after he had syncope at home following 2 days of intermittent melena.  He had no chest pain, orthopnea, PND, palpitations, or orthopnea.  There is no lower extremity swelling.  He received 4 units of blood.  Aspirin was discontinued in the hospital.  He was discharged on his maintenance Coumadin dose.  INR is said to be checked early next week at Dr. Mertha Finders office.  Had no bowel movement since discharge from the hospital.  No recurrence of dizziness and no syncope.   Past Medical History:  Diagnosis Date  . Atrial fibrillation ([place])   . CAD (coronary artery disease) of artery bypass graft 2000   2000 with multiple bypasses   . Chronic anticoagulation    Coumadin therapy for mechanical aortic valve. Postoperative atrial fibrillation.   . Essential hypertension   . H/O mechanical aortic valve replacement 2000   St Jude AVR  . Hypothyroidism   . Premature ventricular contractions     Past Surgical History:  Procedure Laterality Date  . AORTIC VALVE REPLACEMENT (AVR)/CORONARY ARTERY BYPASS GRAFTING (CABG)  2000  . ESOPHAGOGASTRODUODENOSCOPY (EGD) WITH PROPOFOL N/A 01/06/2018   Procedure: ESOPHAGOGASTRODUODENOSCOPY (EGD) WITH PROPOFOL;  Surgeon: Otis Brace, MD;  Location: [facility];  Service: Gastroenterology;  Laterality: N/A;    Current Medications: Outpatient Medications Prior to Visit  Medication Sig  Dispense Refill  . amLODipine (NORVASC) 5 MG tablet Take 5 mg by mouth daily before breakfast.     . amoxicillin-clavulanate (AUGMENTIN) 500-125 MG tablet Take 1 tablet by mouth 2 (two) times daily.  0  . cholecalciferol (VITAMIN D) 1000 UNITS tablet Take 2,000 Units by mouth daily.     [name] COUMADIN 5 MG tablet Take 5-7.5 mg by mouth See admin instructions. 5 mg every Monday and Friday and 7.5 mg on all other days    . finasteride (PROSCAR) 5 MG tablet Take 5 mg by mouth daily.    . hydrocortisone (ANUSOL-HC) 2.5 % rectal cream Apply 1 application topically 4 (four) times daily as needed for hemorrhoids. 30 g 0  . Iron TABS Take 1 tablet by mouth daily. Pt not sure of dosage     . levothyroxine (SYNTHROID, LEVOTHROID) 75 MCG tablet Take 75 mcg by mouth daily before breakfast.     . metoprolol tartrate (LOPRESSOR) 50 MG tablet Take 1 tablet (50 mg total) by mouth 2 (two) times daily. 60 tablet 0  . Multiple Vitamins-Minerals (PRESERVISION AREDS PO) Take 1 tablet by mouth 2 (two) times daily.    . pantoprazole (PROTONIX) 40 MG tablet Take 1 tablet (40 mg total) by mouth 2 (two) times daily. 60 tablet 0  . rosuvastatin (CRESTOR) 20 MG tablet Take 20 mg by mouth daily. 3 times a week    . saccharomyces boulardii (FLORASTOR) 250 MG capsule Take 1 capsule (250 mg total) by mouth 2 (two) times daily. 30 capsule 0  . tamsulosin (FLOMAX) 0.4 MG  CAPS capsule Take 0.4 mg by mouth daily.    [name] witch hazel-glycerin (TUCKS) pad Apply topically as needed for itching. 40 each 12   No facility-administered medications prior to visit.      Allergies:   Vicodin [hydrocodone-acetaminophen]; Zocor [simvastatin]; Benicar [olmesartan]; Codeine; Lopid [gemfibrozil]; Monopril [fosinopril]; Phenergan [promethazine hcl]; and Prilosec [omeprazole]   Social History   Socioeconomic History  . Marital status: Married    Spouse name: Not on file  . Number of children: Not on file  . Years of education: Not on file  .  Highest education level: Not on file  Occupational History  . Not on file  Social Needs  . Financial resource strain: Not on file  . Food insecurity:    Worry: Not on file    Inability: Not on file  . Transportation needs:    Medical: Not on file    Non-medical: Not on file  Tobacco Use  . Smoking status: Former Smoker    Types: Cigarettes  . Smokeless tobacco: Never Used  . Tobacco comment: quit 57 years ago  Substance and Sexual Activity  . Alcohol use: No  . Drug use: No  . Sexual activity: Not on file  Lifestyle  . Physical activity:    Days per week: Not on file    Minutes per session: Not on file  . Stress: Not on file  Relationships  . Social connections:    Talks on phone: Not on file    Gets together: Not on file    Attends religious service: Not on file    Active member of club or organization: Not on file    Attends meetings of clubs or organizations: Not on file    Relationship status: Not on file  Other Topics Concern  . Not on file  Social History Narrative  . Not on file     Family History:  The patient's family history includes Heart attack in his mother; Hypertension in his father and mother.   ROS:   Please see the history of present illness.    Ulna has resolved.  No recurrent abdominal pain.  No blood in his urine.  Has difficulty with urination. All other systems reviewed and are negative.   PHYSICAL EXAM:   VS:  There were no vitals taken for this visit.   GEN: Well nourished, well developed, in no acute distress  HEENT: normal  Neck: no JVD, carotid bruits, or masses Cardiac: Mechanical valve closure sound compatible with known device.  Soft systolic murmur but no diastolic murmur.  IRR; no rubs, or gallops,no edema  Respiratory:  clear to auscultation bilaterally, normal work of breathing GI: soft, nontender, nondistended, + BS MS: no deformity or atrophy  Skin: warm and dry, no rash Neuro:  Alert and Oriented x 3, Strength and  sensation are intact Psych: euthymic mood, full affect  Wt Readings from Last 3 Encounters:  01/08/18 181 lb (82.1 kg)  05/26/17 185 lb 3 oz (84 kg)  11/12/16 189 lb 1.9 oz (85.8 kg)      Studies/Labs Reviewed:   EKG:  EKG no new tracing today and personally reviewed the most recent EKG from 3 days ago during the hospital stay which reveals left anterior hemiblock, sinus bradycardia, and poor R wave progression.  Recent Labs: 05/26/2017: TSH 2.100 01/08/2018: ALT 32; BUN 15; Creatinine, Ser 1.18; Hemoglobin 8.1; Magnesium 1.8; Platelets 166; Potassium 3.7; Sodium 139   Lipid Panel No results found for: CHOL, TRIG,  HDL, CHOLHDL, VLDL, LDLCALC, LDLDIRECT  Additional studies/ records that were reviewed today include:  2D Doppler echocardiogram 01/03/2018: Study Conclusions  - Left ventricle: Wall thickness was increased in a pattern of mild   LVH. Systolic function was normal. The estimated ejection   fraction was in the range of 55% to 60%. The study is not   technically sufficient to allow evaluation of LV diastolic   function. - Aortic valve: DVI .6 suggesting normal AVR function Post St Jude   AVR no perivalvular regurgitations stable bradients Valve area   (VTI): 1.89 cm^2. Valve area (Vmax): 1.51 cm^2. Valve area   (Vmean): 1.6 cm^2. - Mitral valve: Severely calcified annulus. Moderately thickened,   mildly calcified leaflets . There was mild regurgitation. Valve   area by continuity equation (using LVOT flow): 1.94 cm^2. - Left atrium: The atrium was moderately dilated. - Right atrium: The atrium was mildly dilated. - Atrial septum: No defect or patent foramen ovale was identified. - Pulmonary arteries: PA peak pressure: 43 mm Hg (S).   ASSESSMENT:    1. H/O mechanical aortic valve replacement   2. Coronary artery disease of bypass graft of native heart with stable angina pectoris ([place])   3. Chronic anticoagulation   4. Gastrointestinal hemorrhage with melena   5.  Paroxysmal atrial flutter ([facility])      PLAN:  In order of problems listed above:  1. Mechanical aortic valve without clinical evidence of dysfunction.  Now back on Coumadin.  INR range should probably be in the 2.5-3.0 range now given the significant recent GI bleed.  This is going to be followed by Dr. Mertha Finders his primary physician. 2. No angina or EKG evidence of ischemia during significant reduction in hemoglobin and stress associated with transfusion.  Overall feel coronary flow must be good. 3. Please see above.  Will remain on Coumadin anticoagulation therapy.  Probably would be safer on a NOAC but no data in the setting of mechanical valve disease. 4. Nonbleeding esophageal ulcer was identified.  Also had colonoscopy which was unremarkable.  No recurrence of melena. 5. No evidence of atrial fibrillation currently.  Clinical follow-up in 1 year.  Valve function is normal.  LV function is normal by recent echo.  INR 2.0-3.0, and prefer keeping it in the 2.5 range.    Medication Adjustments/Labs and Tests Ordered: Current medicines are reviewed at length with the patient today.  Concerns regarding medicines are outlined above.  Medication changes, Labs and Tests ordered today are listed in the Patient Instructions below. There are no Patient Instructions on file for this visit.   Avinger III, MD  01/09/2018 1:13 PM    [facility] [address], [place], [place]  [address] Phone: [phone]; Fax: [phone]

## 2018-01-09 NOTE — Patient Instructions (Signed)

## 2018-01-15 DIAGNOSIS — H353221 Exudative age-related macular degeneration, left eye, with active choroidal neovascularization: Secondary | ICD-10-CM | POA: Diagnosis not present

## 2018-01-15 DIAGNOSIS — H353111 Nonexudative age-related macular degeneration, right eye, early dry stage: Secondary | ICD-10-CM | POA: Diagnosis not present

## 2018-01-15 DIAGNOSIS — H43813 Vitreous degeneration, bilateral: Secondary | ICD-10-CM | POA: Diagnosis not present

## 2018-01-16 DIAGNOSIS — K922 Gastrointestinal hemorrhage, unspecified: Secondary | ICD-10-CM | POA: Diagnosis not present

## 2018-01-16 DIAGNOSIS — Z7901 Long term (current) use of anticoagulants: Secondary | ICD-10-CM | POA: Diagnosis not present

## 2018-01-21 DIAGNOSIS — R609 Edema, unspecified: Secondary | ICD-10-CM | POA: Diagnosis not present

## 2018-02-04 DIAGNOSIS — R3915 Urgency of urination: Secondary | ICD-10-CM | POA: Diagnosis not present

## 2018-02-04 DIAGNOSIS — R399 Unspecified symptoms and signs involving the genitourinary system: Secondary | ICD-10-CM | POA: Diagnosis not present

## 2018-02-05 ENCOUNTER — Encounter (HOSPITAL_COMMUNITY): Payer: Self-pay | Admitting: *Deleted

## 2018-02-05 ENCOUNTER — Emergency Department (HOSPITAL_COMMUNITY)
Admission: EM | Admit: 2018-02-05 | Discharge: 2018-02-06 | Disposition: A | Discharge status: Home / Self Care | Payer: Medicare Other | Attending: Emergency Medicine | Admitting: Emergency Medicine

## 2018-02-05 ENCOUNTER — Other Ambulatory Visit: Payer: Self-pay

## 2018-02-05 ENCOUNTER — Emergency Department (HOSPITAL_COMMUNITY): Payer: Medicare Other

## 2018-02-05 DIAGNOSIS — I2510 Atherosclerotic heart disease of native coronary artery without angina pectoris: Secondary | ICD-10-CM | POA: Diagnosis not present

## 2018-02-05 DIAGNOSIS — Z79899 Other long term (current) drug therapy: Secondary | ICD-10-CM | POA: Insufficient documentation

## 2018-02-05 DIAGNOSIS — Z87891 Personal history of nicotine dependence: Secondary | ICD-10-CM | POA: Insufficient documentation

## 2018-02-05 DIAGNOSIS — E039 Hypothyroidism, unspecified: Secondary | ICD-10-CM | POA: Diagnosis not present

## 2018-02-05 DIAGNOSIS — R791 Abnormal coagulation profile: Secondary | ICD-10-CM | POA: Diagnosis not present

## 2018-02-05 DIAGNOSIS — I4891 Unspecified atrial fibrillation: Secondary | ICD-10-CM | POA: Diagnosis not present

## 2018-02-05 DIAGNOSIS — I10 Essential (primary) hypertension: Secondary | ICD-10-CM | POA: Diagnosis not present

## 2018-02-05 DIAGNOSIS — Z7901 Long term (current) use of anticoagulants: Secondary | ICD-10-CM | POA: Insufficient documentation

## 2018-02-05 DIAGNOSIS — R0902 Hypoxemia: Secondary | ICD-10-CM | POA: Diagnosis not present

## 2018-02-05 DIAGNOSIS — R002 Palpitations: Secondary | ICD-10-CM | POA: Diagnosis not present

## 2018-02-05 DIAGNOSIS — R000 Tachycardia, unspecified: Secondary | ICD-10-CM | POA: Diagnosis present

## 2018-02-05 LAB — URINALYSIS, ROUTINE W REFLEX MICROSCOPIC
Bilirubin Urine: NEGATIVE
Glucose, UA: NEGATIVE mg/dL
Hgb urine dipstick: NEGATIVE
KETONES UR: NEGATIVE mg/dL
LEUKOCYTES UA: NEGATIVE
NITRITE: NEGATIVE
PH: 6 (ref 5.0–8.0)
PROTEIN: NEGATIVE mg/dL
Specific Gravity, Urine: 1.004 — ABNORMAL LOW (ref 1.005–1.030)

## 2018-02-05 LAB — I-STAT TROPONIN, ED: Troponin i, poc: 0.03 ng/mL (ref 0.00–0.08)

## 2018-02-05 LAB — BASIC METABOLIC PANEL
ANION GAP: 7 (ref 5–15)
BUN: 12 mg/dL (ref 6–20)
CHLORIDE: 105 mmol/L (ref 101–111)
CO2: 27 mmol/L (ref 22–32)
Calcium: 9.4 mg/dL (ref 8.9–10.3)
Creatinine, Ser: 1.25 mg/dL — ABNORMAL HIGH (ref 0.61–1.24)
GFR calc Af Amer: 60 mL/min — ABNORMAL LOW (ref >60)
GFR, EST NON AFRICAN AMERICAN: 51 mL/min — AB (ref >60)
GLUCOSE: 132 mg/dL — AB (ref 65–99)
POTASSIUM: 4.3 mmol/L (ref 3.5–5.1)
Sodium: 139 mmol/L (ref 135–145)

## 2018-02-05 LAB — CBC
HEMATOCRIT: 41.4 % (ref 39.0–52.0)
HEMOGLOBIN: 12.7 g/dL — AB (ref 13.0–17.0)
MCH: 30.8 pg (ref 26.0–34.0)
MCHC: 30.7 g/dL (ref 30.0–36.0)
MCV: 100.5 fL — ABNORMAL HIGH (ref 78.0–100.0)
Platelets: 214 10*3/uL (ref 150–400)
RBC: 4.12 MIL/uL — ABNORMAL LOW (ref 4.22–5.81)
RDW: 16.7 % — ABNORMAL HIGH (ref 11.5–15.5)
WBC: 7.1 10*3/uL (ref 4.0–10.5)

## 2018-02-05 LAB — PROTIME-INR: Prothrombin Time: >90 seconds — ABNORMAL HIGH (ref 11.4–15.2)

## 2018-02-05 LAB — TSH: TSH: 5.917 u[IU]/mL — ABNORMAL HIGH (ref 0.350–4.500)

## 2018-02-05 MED ORDER — METOPROLOL TARTRATE 5 MG/5ML IV SOLN
5.0000 mg | Freq: Once | INTRAVENOUS | Status: AC
  Administered 2018-02-05: 5 mg via INTRAVENOUS
  Filled 2018-02-05: qty 5

## 2018-02-05 MED ORDER — METOPROLOL TARTRATE 25 MG PO TABS: ORAL_TABLET | ORAL | 0 refills | Status: DC

## 2018-02-05 MED ORDER — DILTIAZEM HCL 25 MG/5ML IV SOLN
20.0000 mg | Freq: Once | INTRAVENOUS | Status: AC
  Administered 2018-02-05: 20 mg via INTRAVENOUS
  Filled 2018-02-05: qty 5

## 2018-02-05 NOTE — Discharge Instructions (Signed)
Hold coumadin for 2 days. Increase metoprolol to 75 mg twice a day.

## 2018-02-05 NOTE — ED Notes (Signed)
Bed: WA21 Expected date:  Expected time:  Means of arrival:  Comments: Ems/a fib

## 2018-02-05 NOTE — ED Provider Notes (Addendum)
[facility] DEPT Provider Note   CSN: [id number] Arrival date & time: 02/05/18  1827     History   Chief Complaint Chief Complaint  Patient presents with  . Heart Assessment    HPI Jason Santana is a 84 y.o. male.  Pt presents to the ED today with his heart beating faster than normal.  The pt said he noticed it yesterday.  Pt does have a hx of mechanical aortic valve replacement, chronic a.fib, chronic coumadin therapy.  He was admitted for a lower GI bleed from 5/3-8, but has not had any more issues.  He required 4 units of blood in the hospital.  He was briefly off coumadin, but that has been restarted.  Dr. Tamala Julian wants INR around 2.5.  The pt was d/c on his maintenance dose of coumadin.  No syncope.  No cp.  CHA2DS2/VAS Stroke Risk Points  Current as of 10 minutes ago     4 >= 2 Points: High Risk  1 - 1.99 Points: Medium Risk  0 Points: Low Risk    This is the only CHA2DS2/VAS Stroke Risk Points available for the past  year.:  Last Change: N/A     Details    This score determines the patient's risk of having a stroke if the  patient has atrial fibrillation.       Points Metrics  0 Has Congestive Heart Failure:  No    Current as of 10 minutes ago  1 Has Vascular Disease:  Yes    Current as of 10 minutes ago  1 Has Hypertension:  Yes    Current as of 10 minutes ago  2 Age:  20    Current as of 10 minutes ago  0 Has Diabetes:  No    Current as of 10 minutes ago  0 Had Stroke:  No  Had TIA:  No  Had thromboembolism:  No    Current as of 10 minutes ago  0 Male:  No    Current as of 10 minutes ago                        Past Medical History:  Diagnosis Date  . Atrial fibrillation ([place])   . CAD (coronary artery disease) of artery bypass graft 2000   2000 with multiple bypasses   . Chronic anticoagulation    Coumadin therapy for mechanical aortic valve. Postoperative atrial fibrillation.   . Essential hypertension   .  H/O mechanical aortic valve replacement 2000   St Jude AVR  . Hypothyroidism   . Premature ventricular contractions     Patient Active Problem List   Diagnosis Date Noted  . Atypical atrial flutter ([place]) 02/07/2018  . Syncope 01/04/2018  . GI bleed 01/03/2018  . Macular degeneration of left eye 05/26/2017  . Hypothyroidism 05/26/2017  . Hyponatremia 05/25/2017  . Sepsis ([place]) 05/25/2017  . Muscle weakness 12/13/2014  . Paroxysmal atrial flutter ([place]) 11/25/2013  . Essential hypertension 11/25/2013  . H/O mechanical aortic valve replacement 11/25/2013  . CAD (coronary artery disease) of artery bypass graft 11/25/2013  . Chronic anticoagulation 11/25/2013  . Premature ventricular contractions 11/25/2013    Past Surgical History:  Procedure Laterality Date  . AORTIC VALVE REPLACEMENT (AVR)/CORONARY ARTERY BYPASS GRAFTING (CABG)  2000  . COLONOSCOPY WITH PROPOFOL N/A 01/08/2018   Procedure: COLONOSCOPY WITH PROPOFOL;  Surgeon: Arta Silence, MD;  Location: [facility];  Service: Gastroenterology;  Laterality: N/A;  .  ESOPHAGOGASTRODUODENOSCOPY (EGD) WITH PROPOFOL N/A 01/06/2018   Procedure: ESOPHAGOGASTRODUODENOSCOPY (EGD) WITH PROPOFOL;  Surgeon: Otis Brace, MD;  Location: [facility];  Service: Gastroenterology;  Laterality: N/A;        Home Medications    Prior to Admission medications   Medication Sig Start Date End Date Taking? Authorizing Provider  cholecalciferol (VITAMIN D) 1000 UNITS tablet Take 2,000 Units by mouth daily.    Yes [provider]  COUMADIN 5 MG tablet Take 5-7.5 mg by mouth See admin instructions. 5 mg every Monday, Friday and Friday and  7.5 mg on all other days 09/18/13  Yes [provider]  finasteride (PROSCAR) 5 MG tablet Take 5 mg by mouth daily. 10/07/17  Yes [provider]  Iron TABS Take 1 tablet by mouth daily. Pt not sure of dosage    Yes [provider]  levothyroxine (SYNTHROID, LEVOTHROID) 75 MCG  tablet Take 75 mcg by mouth daily before breakfast.  10/06/13  Yes [provider]  pantoprazole (PROTONIX) 40 MG tablet Take 1 tablet (40 mg total) by mouth 2 (two) times daily. 01/08/18  Yes Sheikh, Omair Latif, DO  rosuvastatin (CRESTOR) 20 MG tablet Take 20 mg by mouth daily. 3 times a week   Yes [provider]  saccharomyces boulardii (FLORASTOR) 250 MG capsule Take 1 capsule (250 mg total) by mouth 2 (two) times daily. 05/29/17  Yes Donne Hazel, MD  witch hazel-glycerin (TUCKS) pad Apply topically as needed for itching. 01/08/18  Yes Sheikh, Omair Latif, DO  metoprolol tartrate (LOPRESSOR) 25 MG tablet Take 25 mg by mouth 2 (two) times daily.    [provider]  tamsulosin (FLOMAX) 0.4 MG CAPS capsule Take 0.8 mg by mouth daily. Take two 0.4mg  capsules by mouth daily. Total of 8 mg per day.    [provider]    Family History Family History  Problem Relation Age of Onset  . Hypertension Mother   . Heart attack Mother   . Hypertension Father     Social History Social History   Tobacco Use  . Smoking status: Former Smoker    Types: Cigarettes  . Smokeless tobacco: Never Used  . Tobacco comment: quit 57 years ago  Substance Use Topics  . Alcohol use: No  . Drug use: No     Allergies   Vicodin [hydrocodone-acetaminophen]; Zocor [simvastatin]; Benicar [olmesartan]; Codeine; Lopid [gemfibrozil]; Monopril [fosinopril]; Phenergan [promethazine hcl]; and Prilosec [omeprazole]   Review of Systems Review of Systems  Cardiovascular: Positive for palpitations.  All other systems reviewed and are negative.    Physical Exam Updated Vital Signs BP 115/85 (BP Location: Right Arm)   Pulse 90   Temp 98 F (36.7 C) (Oral)   Resp 16   Ht 5\' 10"  (1.778 m)   Wt 84.8 kg (187 lb)   SpO2 98%   BMI 26.83 kg/m   Physical Exam  Constitutional: He is oriented to person, place, and time. He appears well-developed and well-nourished.  HENT:  Head:  Normocephalic and atraumatic.  Right Ear: External ear normal.  Left Ear: External ear normal.  Nose: Nose normal.  Mouth/Throat: Oropharynx is clear and moist.  Eyes: Pupils are equal, round, and reactive to light. Conjunctivae and EOM are normal.  Neck: Normal range of motion. Neck supple.  Cardiovascular: An irregularly irregular rhythm present. Tachycardia present.  Pulmonary/Chest: Effort normal and breath sounds normal.  Abdominal: Soft. Bowel sounds are normal.  Musculoskeletal: Normal range of motion.  Neurological: He is  alert and oriented to person, place, and time.  Skin: Skin is warm. Capillary refill takes less than 2 seconds.  Psychiatric: He has a normal mood and affect. His behavior is normal. Judgment and thought content normal.  Nursing note and vitals reviewed.    ED Treatments / Results  Labs (all labs ordered are listed, but only abnormal results are displayed) Labs Reviewed  BASIC METABOLIC PANEL - Abnormal; Notable for the following components:      Result Value   Glucose, Bld 132 (*)    Creatinine, Ser 1.25 (*)    GFR calc non Af Amer 51 (*)    GFR calc Af Amer 60 (*)    All other components within normal limits  CBC - Abnormal; Notable for the following components:   RBC 4.12 (*)    Hemoglobin 12.7 (*)    MCV 100.5 (*)    RDW 16.7 (*)    All other components within normal limits  TSH - Abnormal; Notable for the following components:   TSH 5.917 (*)    All other components within normal limits  URINALYSIS, ROUTINE W REFLEX MICROSCOPIC - Abnormal; Notable for the following components:   Color, Urine STRAW (*)    Specific Gravity, Urine 1.004 (*)    All other components within normal limits  PROTIME-INR - Abnormal; Notable for the following components:   Prothrombin Time >90.0 (*)    INR >10.00 (*)    All other components within normal limits  I-STAT TROPONIN, ED    EKG EKG Interpretation  Date/Time:  Wednesday February 05 2018 19:33:48  EDT Ventricular Rate:  93 PR Interval:    QRS Duration: 96 QT Interval:  372 QTC Calculation: 463 R Axis:   -63 Text Interpretation:  Atrial flutter Left anterior fascicular block Probable left ventricular hypertrophy Anterior Q waves, possibly due to LVH Atrial flutter is new since last EKG.  Pt has hx of a.fib/flutter. Confirmed by Isla Pence [phone]) on 02/05/2018 7:46:52 PM Also confirmed by Isla Pence [phone]), editor Hattie Perch ([id number])  on 02/06/2018 7:47:16 AM   Radiology No results found.  Procedures Procedures (including critical care time)  Medications Ordered in ED Medications  metoprolol tartrate (LOPRESSOR) injection 5 mg (5 mg Intravenous Given 02/05/18 2012)  diltiazem (CARDIZEM) injection 20 mg (20 mg Intravenous Given 02/05/18 2129)     Initial Impression / Assessment and Plan / ED Course  I have reviewed the triage vital signs and the nursing notes.  Pertinent labs & imaging results that were available during my care of the patient were reviewed by me and considered in my medical decision making (see chart for details).    HR is improved with 5 mg of lopressor and 20 mg cardizem.    Pt d/w Dr. Paticia Stack (cardiology) who recommended NO cardioversion.  The pt has had low INR levels recently with the recent holding of coumadin.  The pt to increase lopressor 75 mg bid.  The pt to f/u with Dr. Tamala Julian for possible TEE with cardioversion.  Pt to hold coumadin and f/u with Dr. Inda Merlin for INR recheck.  Final Clinical Impressions(s) / ED Diagnoses   Final diagnoses:  Atrial fibrillation with RVR ([place])  Supratherapeutic INR    ED Discharge Orders        Ordered    metoprolol tartrate (LOPRESSOR) 25 MG tablet  Status:  Discontinued     02/05/18 2242       Isla Pence, MD 02/05/18 2246    Gilford Raid,  Almyra Free, MD 02/17/18 [phone]

## 2018-02-05 NOTE — ED Notes (Signed)
Dr Gilford Raid notified of PT INR values

## 2018-02-05 NOTE — ED Triage Notes (Signed)
EMS [facility] today due to heart rate hirer than normal. History of A Fib. Pt is concerned due to history and knowing his health. #18 L AC 178/80-100-97% RA 12 Ld A Fib

## 2018-02-06 ENCOUNTER — Ambulatory Visit (INDEPENDENT_AMBULATORY_CARE_PROVIDER_SITE_OTHER): Payer: Medicare Other | Admitting: Internal Medicine

## 2018-02-06 ENCOUNTER — Telehealth: Payer: Self-pay | Admitting: Interventional Cardiology

## 2018-02-06 ENCOUNTER — Encounter: Payer: Self-pay | Admitting: Internal Medicine

## 2018-02-06 VITALS — BP 100/72 | HR 115 | Ht 70.0 in | Wt 186.2 lb

## 2018-02-06 DIAGNOSIS — I10 Essential (primary) hypertension: Secondary | ICD-10-CM

## 2018-02-06 DIAGNOSIS — Z952 Presence of prosthetic heart valve: Secondary | ICD-10-CM | POA: Diagnosis not present

## 2018-02-06 DIAGNOSIS — R000 Tachycardia, unspecified: Secondary | ICD-10-CM | POA: Diagnosis not present

## 2018-02-06 DIAGNOSIS — I482 Chronic atrial fibrillation: Secondary | ICD-10-CM | POA: Diagnosis not present

## 2018-02-06 DIAGNOSIS — I484 Atypical atrial flutter: Secondary | ICD-10-CM

## 2018-02-06 DIAGNOSIS — Z7901 Long term (current) use of anticoagulants: Secondary | ICD-10-CM | POA: Diagnosis not present

## 2018-02-06 MED ORDER — METOPROLOL TARTRATE 50 MG PO TABS: ORAL_TABLET | ORAL | 3 refills | Status: DC

## 2018-02-06 NOTE — Telephone Encounter (Signed)
Spoke with pt , he states that yesterday evening he was seen in the ER with increase heart rate. Pt has a history of A-Fib. With a mechanical aortic valve replacement chronic A-fib and chronic coumadin therapy. Pt was given  Lopressor 5 mg and Cardizem 20 mg in the ER. Pt was D/C home with improved HR pt states today that he was advised to called his cardiology office to make an appointment to see Dr. Tamala Julian tomorrow, but Dr. Tamala Julian is overbooked. Pt estates that his HR is up today like when he was in the ER yesterday. Pt's Lopressor was increased to 75 mg BID. Pt took the first dose early this AM. The arrhythmia clinic is double book today and has no probider tomorrow.  Pt would like to be seen today if possibles.  Dr End DOD aware pt agreed to see pt today at 3:20 PM today pt is aware, and verbalized understanding.

## 2018-02-06 NOTE — Progress Notes (Signed)
Follow-up Outpatient Visit Date: 02/06/2018  Primary Care Provider: [name], MD [address] [place] [address]  Chief Complaint: Elevated heart rate  HPI:  Mr. Largo is a 84 y.o. year-old male with history of valvular heart disease status post mechanical aortic valve replacement (2000), chronic atrial fibrillation on warfarin, and coronary artery, who presents urgent evaluation of tachycardia.  Mr. Thornsberry was hospitalized last month at [facility] for an upper GI bleed that required 4 units of blood.  Warfarin was discontinued during the admission but since has been restart (followed by his PCP, Dr. Inda Merlin).  Mr. Marchetti has noted that his heart rate was elevated over the last 2-3 days, so fast that he was unable to count it yesterday.  He therefore proceeded to the [facility] ED, where he was found to be in atrial flutter.  Given recent discontinuation of warfarin, cardioversion was performed and metoprolol was increased to 75 mg BID.  He was advised to call our office today for further recommendations.  Mr. Maisano continues to note that his heart rate is elevated, albeit less so than yesterday.  He otherwise feels well, denying chest pain, shortness of breath, palpitations, lightheadedness and edema.  He has not had any further melena or hematochezia since his hospitalization last month.  INR was reported as >10 during yesterday's ED visit, though INR check at Dr. Inda Merlin' office today was 2.4 (goal 2.5-3.0 per Dr. Thompson Caul last note).  --------------------------------------------------------------------------------------------------  Past Medical History:  Diagnosis Date  . Atrial fibrillation ([place])   . CAD (coronary artery disease) of artery bypass graft 2000   2000 with multiple bypasses   . Chronic anticoagulation    Coumadin therapy for mechanical aortic valve. Postoperative atrial fibrillation.   . Essential hypertension   . H/O mechanical aortic valve replacement 2000     St Jude AVR  . Hypothyroidism   . Premature ventricular contractions    Past Surgical History:  Procedure Laterality Date  . AORTIC VALVE REPLACEMENT (AVR)/CORONARY ARTERY BYPASS GRAFTING (CABG)  2000  . COLONOSCOPY WITH PROPOFOL N/A 01/08/2018   Procedure: COLONOSCOPY WITH PROPOFOL;  Surgeon: Arta Silence, MD;  Location: [facility];  Service: Gastroenterology;  Laterality: N/A;  . ESOPHAGOGASTRODUODENOSCOPY (EGD) WITH PROPOFOL N/A 01/06/2018   Procedure: ESOPHAGOGASTRODUODENOSCOPY (EGD) WITH PROPOFOL;  Surgeon: Otis Brace, MD;  Location: [facility];  Service: Gastroenterology;  Laterality: N/A;    Current Meds  Medication Sig  . amLODipine (NORVASC) 5 MG tablet Take 5 mg by mouth daily before breakfast.   . amoxicillin-clavulanate (AUGMENTIN) 500-125 MG tablet Take 1 tablet by mouth 2 (two) times daily.  . cholecalciferol (VITAMIN D) 1000 UNITS tablet Take 2,000 Units by mouth daily.   [name] COUMADIN 5 MG tablet Take 5-7.5 mg by mouth See admin instructions. 5 mg every Monday, Friday and Friday and  7.5 mg on all other days  . finasteride (PROSCAR) 5 MG tablet Take 5 mg by mouth daily.  . hydrocortisone (ANUSOL-HC) 2.5 % rectal cream Apply 1 application topically 4 (four) times daily as needed for hemorrhoids.  . Iron TABS Take 1 tablet by mouth daily. Pt not sure of dosage   . levothyroxine (SYNTHROID, LEVOTHROID) 75 MCG tablet Take 75 mcg by mouth daily before breakfast.   . metoprolol tartrate (LOPRESSOR) 25 MG tablet Take a total of 75 mg twice a day.  . Multiple Vitamins-Minerals (PRESERVISION AREDS PO) Take 1 tablet by mouth 2 (two) times daily.  . pantoprazole (PROTONIX) 40 MG  tablet Take 1 tablet (40 mg total) by mouth 2 (two) times daily.  . rosuvastatin (CRESTOR) 20 MG tablet Take 20 mg by mouth daily. 3 times a week  . saccharomyces boulardii (FLORASTOR) 250 MG capsule Take 1 capsule (250 mg total) by mouth 2 (two) times daily.  . tamsulosin (FLOMAX) 0.4 MG CAPS  capsule Take 0.4 mg by mouth daily.  [name] witch hazel-glycerin (TUCKS) pad Apply topically as needed for itching.    Allergies: Vicodin [hydrocodone-acetaminophen]; Zocor [simvastatin]; Benicar [olmesartan]; Codeine; Lopid [gemfibrozil]; Monopril [fosinopril]; Phenergan [promethazine hcl]; and Prilosec [omeprazole]  Social History   Tobacco Use  . Smoking status: Former Smoker    Types: Cigarettes  . Smokeless tobacco: Never Used  . Tobacco comment: quit 57 years ago  Substance Use Topics  . Alcohol use: No  . Drug use: No    Family History  Problem Relation Age of Onset  . Hypertension Mother   . Heart attack Mother   . Hypertension Father     Review of Systems: A 12-system review of systems was performed and was negative except as noted in the HPI.  --------------------------------------------------------------------------------------------------  Physical Exam: BP 100/72   Pulse (!) 115   Ht 5\' 10"  (1.778 m)   Wt 186 lb 3.2 oz (84.5 kg)   SpO2 97%   BMI 26.72 kg/m   General:  NAD HEENT: No conjunctival pallor or scleral icterus. Moist mucous membranes.  OP clear. Neck: Supple without lymphadenopathy, thyromegaly, JVD, or HJR. No carotid bruit. Lungs: Normal work of breathing. Clear to auscultation bilaterally without wheezes or crackles. Heart: Tachycardic but regular. Mechanical S2 noted.  No murmurs, rubs, or gallops. Non-displaced PMI. Abd: Bowel sounds present. Soft, NT/ND without hepatosplenomegaly Ext: No lower extremity edema. Radial, PT, and DP pulses are 2+ bilaterally. Skin: Warm and dry without rash.  EKG:  Atypical atrial flutter with 2:1 conduction and isolated PVC.  LVH with QRS widening and abnormal repolarization.  Poor R-wave progression.  Lab Results  Component Value Date   WBC 7.1 02/05/2018   HGB 12.7 (L) 02/05/2018   HCT 41.4 02/05/2018   MCV 100.5 (H) 02/05/2018   PLT 214 02/05/2018    Lab Results  Component Value Date   NA 139  02/05/2018   K 4.3 02/05/2018   CL 105 02/05/2018   CO2 27 02/05/2018   BUN 12 02/05/2018   CREATININE 1.25 (H) 02/05/2018   GLUCOSE 132 (H) 02/05/2018   ALT 32 01/08/2018    No results found for: CHOL, HDL, LDLCALC, LDLDIRECT, TRIG, CHOLHDL  --------------------------------------------------------------------------------------------------  ASSESSMENT AND PLAN: Atrial flutter Heart rate is suboptimally controlled at this time, though improved from yesterday's rates.  I have recommend that Mr. Burgin increase metoprolol tartrate to 100 mg BID and continue warfarin under the direction of Dr. Inda Merlin.  Given soft blood pressure, I will have Mr. Garno hold amlodipine to allow for escalation of metoprolol.  We will have him follow-up in the a-fib clinic next week.  If he remains in atrial flutter with suboptimal rate control and his INR has remained therapeutic, DCCV can be considered.  Given that his heart rate is only mildly elevated and Mr. Keesling is asymptomatic, I do not believe that he need urgent TEE-guided cardioversion.  Mechanical aortic valve No symptoms or exam findings to suggest valve dysfunction.  Continue warfarin per Dr. Inda Merlin (INR goal 2.5-3 per Dr. Thompson Caul last note).  Follow-up with Dr. Tamala Julian as previously discussed.  Hypertension Blood pressure borderline low albeit without  symptoms.  I will hold amlodipine and increase metoprolol tartrate to 100 mg BID.  Follow-up: A-fib clinic in 1 week.  Nelva Bush, MD 02/06/2018 3:44 PM

## 2018-02-06 NOTE — Patient Instructions (Addendum)
Medication Instructions:  STOP Amlodipine  START Metoprolol Tartrate 100 mg twice per day   -- If you need a refill on your cardiac medications before your next appointment, please call your pharmacy. --  Labwork: None ordered  Testing/Procedures: None ordered  Follow-Up: PLEASE Schedule Afib clinic appointment (1 week)  Thank you for choosing [facility]!!    Any Other Special Instructions Will Be Listed Below (If Applicable).

## 2018-02-06 NOTE — Telephone Encounter (Signed)
Pt calling   Pt was seen in the ED last night and need to speak to nurse concerning his ER visit for INR and Heart issues. Please call pt.

## 2018-02-07 ENCOUNTER — Encounter: Payer: Self-pay | Admitting: Internal Medicine

## 2018-02-07 DIAGNOSIS — I484 Atypical atrial flutter: Secondary | ICD-10-CM | POA: Insufficient documentation

## 2018-02-18 ENCOUNTER — Ambulatory Visit (HOSPITAL_COMMUNITY)
Admission: RE | Admit: 2018-02-18 | Discharge: 2018-02-18 | Disposition: A | Discharge status: Home / Self Care | Payer: Medicare Other | Source: Ambulatory Visit | Attending: Nurse Practitioner | Admitting: Nurse Practitioner

## 2018-02-18 ENCOUNTER — Encounter (HOSPITAL_COMMUNITY): Payer: Self-pay | Admitting: Nurse Practitioner

## 2018-02-18 VITALS — BP 158/94 | HR 116 | Ht 70.0 in | Wt 189.0 lb

## 2018-02-18 DIAGNOSIS — I484 Atypical atrial flutter: Secondary | ICD-10-CM | POA: Diagnosis not present

## 2018-02-18 DIAGNOSIS — I25708 Atherosclerosis of coronary artery bypass graft(s), unspecified, with other forms of angina pectoris: Secondary | ICD-10-CM | POA: Diagnosis not present

## 2018-02-18 DIAGNOSIS — K922 Gastrointestinal hemorrhage, unspecified: Secondary | ICD-10-CM | POA: Diagnosis not present

## 2018-02-18 DIAGNOSIS — I10 Essential (primary) hypertension: Secondary | ICD-10-CM | POA: Insufficient documentation

## 2018-02-18 DIAGNOSIS — E039 Hypothyroidism, unspecified: Secondary | ICD-10-CM | POA: Insufficient documentation

## 2018-02-18 DIAGNOSIS — Z952 Presence of prosthetic heart valve: Secondary | ICD-10-CM

## 2018-02-18 DIAGNOSIS — Z7901 Long term (current) use of anticoagulants: Secondary | ICD-10-CM | POA: Diagnosis not present

## 2018-02-18 DIAGNOSIS — I481 Persistent atrial fibrillation: Secondary | ICD-10-CM

## 2018-02-18 DIAGNOSIS — Z885 Allergy status to narcotic agent status: Secondary | ICD-10-CM | POA: Insufficient documentation

## 2018-02-18 DIAGNOSIS — Z888 Allergy status to other drugs, medicaments and biological substances status: Secondary | ICD-10-CM | POA: Insufficient documentation

## 2018-02-18 DIAGNOSIS — Z87891 Personal history of nicotine dependence: Secondary | ICD-10-CM | POA: Insufficient documentation

## 2018-02-18 DIAGNOSIS — Z79899 Other long term (current) drug therapy: Secondary | ICD-10-CM | POA: Insufficient documentation

## 2018-02-18 DIAGNOSIS — K921 Melena: Secondary | ICD-10-CM

## 2018-02-18 DIAGNOSIS — I2510 Atherosclerotic heart disease of native coronary artery without angina pectoris: Secondary | ICD-10-CM | POA: Insufficient documentation

## 2018-02-18 DIAGNOSIS — Z8249 Family history of ischemic heart disease and other diseases of the circulatory system: Secondary | ICD-10-CM | POA: Insufficient documentation

## 2018-02-18 DIAGNOSIS — Z9889 Other specified postprocedural states: Secondary | ICD-10-CM | POA: Diagnosis not present

## 2018-02-18 DIAGNOSIS — I4819 Other persistent atrial fibrillation: Secondary | ICD-10-CM

## 2018-02-18 LAB — BASIC METABOLIC PANEL
ANION GAP: 7 (ref 5–15)
BUN: 9 mg/dL (ref 6–20)
CALCIUM: 9.8 mg/dL (ref 8.9–10.3)
CO2: 28 mmol/L (ref 22–32)
Chloride: 104 mmol/L (ref 101–111)
Creatinine, Ser: 1.18 mg/dL (ref 0.61–1.24)
GFR, EST NON AFRICAN AMERICAN: 55 mL/min — AB (ref >60)
Glucose, Bld: 128 mg/dL — ABNORMAL HIGH (ref 65–99)
POTASSIUM: 4.3 mmol/L (ref 3.5–5.1)
Sodium: 139 mmol/L (ref 135–145)

## 2018-02-18 LAB — CBC
HCT: 46.4 % (ref 39.0–52.0)
Hemoglobin: 14.2 g/dL (ref 13.0–17.0)
MCH: 31 pg (ref 26.0–34.0)
MCHC: 30.6 g/dL (ref 30.0–36.0)
MCV: 101.3 fL — AB (ref 78.0–100.0)
PLATELETS: 207 10*3/uL (ref 150–400)
RBC: 4.58 MIL/uL (ref 4.22–5.81)
RDW: 16.2 % — AB (ref 11.5–15.5)
WBC: 7.4 10*3/uL (ref 4.0–10.5)

## 2018-02-18 LAB — PROTIME-INR
INR: 2.37
PROTHROMBIN TIME: 25.7 s — AB (ref 11.4–15.2)

## 2018-02-18 NOTE — H&P (View-Only) (Signed)
Primary Care Physician: [name], MD Primary Cardiologist: Colman Birdwell is a 84 y.o. male with a history of persistent atrial fibrillation and atrial flutter who presents for follow up in the [facility].  Since last being seen in clinic, the patient reports doing relataively well.  He remains in atypical atrial flutter today.  Today, he  denies symptoms of palpitations, chest pain, shortness of breath, orthopnea, PND, lower extremity edema, dizziness, presyncope, syncope, snoring, daytime somnolence, bleeding, or neurologic sequela. The patient is tolerating medications without difficulties and is otherwise without complaint today.    Atrial Fibrillation Risk Factors:  he does not have symptoms or diagnosis of sleep apnea.  he does not have a history of rheumatic fever.  he does not have a history of alcohol use.  he has a BMI of Body mass index is 27.12 kg/m.[name] Filed Weights   02/18/18 1446  Weight: 189 lb (85.7 kg)    LA size: 47   Atrial Fibrillation Management history:  Previous antiarrhythmic drugs: none  Previous cardioversions: 2017  Previous ablations: none  CHADS2VASC score: 4  Anticoagulation history: Warfarin (mechanical AVR)   Past Medical History:  Diagnosis Date  . Atrial fibrillation ([place])   . CAD (coronary artery disease) of artery bypass graft 2000   2000 with multiple bypasses   . Chronic anticoagulation    Coumadin therapy for mechanical aortic valve. Postoperative atrial fibrillation.   . Essential hypertension   . H/O mechanical aortic valve replacement 2000   St Jude AVR  . Hypothyroidism   . Premature ventricular contractions    Past Surgical History:  Procedure Laterality Date  . AORTIC VALVE REPLACEMENT (AVR)/CORONARY ARTERY BYPASS GRAFTING (CABG)  2000  . COLONOSCOPY WITH PROPOFOL N/A 01/08/2018   Procedure: COLONOSCOPY WITH PROPOFOL;  Surgeon: Arta Silence, MD;  Location: [facility];   Service: Gastroenterology;  Laterality: N/A;  . ESOPHAGOGASTRODUODENOSCOPY (EGD) WITH PROPOFOL N/A 01/06/2018   Procedure: ESOPHAGOGASTRODUODENOSCOPY (EGD) WITH PROPOFOL;  Surgeon: Otis Brace, MD;  Location: [facility];  Service: Gastroenterology;  Laterality: N/A;    Current Outpatient Medications  Medication Sig Dispense Refill  . cholecalciferol (VITAMIN D) 1000 UNITS tablet Take 2,000 Units by mouth daily.     [name] COUMADIN 5 MG tablet Take 5-7.5 mg by mouth See admin instructions. 5 mg every Monday, Friday and Friday and  7.5 mg on all other days    . finasteride (PROSCAR) 5 MG tablet Take 5 mg by mouth daily.    . Iron TABS Take 1 tablet by mouth daily. Pt not sure of dosage     . levothyroxine (SYNTHROID, LEVOTHROID) 75 MCG tablet Take 75 mcg by mouth daily before breakfast.     . metoprolol tartrate (LOPRESSOR) 50 MG tablet Take 100 mg by mouth 2 (two) times daily.  3  . pantoprazole (PROTONIX) 40 MG tablet Take 1 tablet (40 mg total) by mouth 2 (two) times daily. 60 tablet 0  . rosuvastatin (CRESTOR) 20 MG tablet Take 20 mg by mouth daily. 3 times a week    . saccharomyces boulardii (FLORASTOR) 250 MG capsule Take 1 capsule (250 mg total) by mouth 2 (two) times daily. 30 capsule 0  . tamsulosin (FLOMAX) 0.4 MG CAPS capsule Take 0.8 mg by mouth daily. Take two 0.4mg  capsules by mouth daily. Total of 8 mg per day.    . witch hazel-glycerin (TUCKS) pad Apply topically as needed for itching. 40 each 12   No current  facility-administered medications for this encounter.     Allergies  Allergen Reactions  . Vicodin [Hydrocodone-Acetaminophen]     Severe sensitivity  . Zocor [Simvastatin]     Short memory loss.  [name] [Olmesartan]     Abdominal cramping and increased stools/ irritable bowel  . Codeine Nausea And Vomiting  . Lopid [Gemfibrozil] Other (See Comments)    transaminitis  . Monopril [Fosinopril]     transaminitis  . Phenergan [Promethazine Hcl]     Severe  somnolence.  . Prilosec [Omeprazole]     dyspepsia    Social History   Socioeconomic History  . Marital status: Married    Spouse name: Not on file  . Number of children: Not on file  . Years of education: Not on file  . Highest education level: Not on file  Occupational History  . Not on file  Social Needs  . Financial resource strain: Not on file  . Food insecurity:    Worry: Not on file    Inability: Not on file  . Transportation needs:    Medical: Not on file    Non-medical: Not on file  Tobacco Use  . Smoking status: Former Smoker    Types: Cigarettes  . Smokeless tobacco: Never Used  . Tobacco comment: quit 57 years ago  Substance and Sexual Activity  . Alcohol use: No  . Drug use: No  . Sexual activity: Not on file  Lifestyle  . Physical activity:    Days per week: Not on file    Minutes per session: Not on file  . Stress: Not on file  Relationships  . Social connections:    Talks on phone: Not on file    Gets together: Not on file    Attends religious service: Not on file    Active member of club or organization: Not on file    Attends meetings of clubs or organizations: Not on file    Relationship status: Not on file  . Intimate partner violence:    Fear of current or ex partner: Not on file    Emotionally abused: Not on file    Physically abused: Not on file    Forced sexual activity: Not on file  Other Topics Concern  . Not on file  Social History Narrative  . Not on file    Family History  Problem Relation Age of Onset  . Hypertension Mother   . Heart attack Mother   . Hypertension Father     ROS- All systems are reviewed and negative except as per the HPI above.  Physical Exam: Vitals:   02/18/18 1446  BP: (!) 158/94  Pulse: (!) 116  Weight: 189 lb (85.7 kg)  Height: 5\' 10"  (1.778 m)    GEN- The patient is elderly appearing, alert and oriented x 3 today.   Head- normocephalic, atraumatic Eyes-  Sclera clear, conjunctiva  pink Ears- hearing intact Oropharynx- clear Neck- supple  Lungs- Clear to ausculation bilaterally, normal work of breathing Heart- Tachycardic regular rate and rhythm  GI- soft, NT, ND, + BS Extremities- no clubbing, cyanosis, or edema MS- no significant deformity or atrophy Skin- no rash or lesion Psych- euthymic mood, full affect Neuro- strength and sensation are intact  Wt Readings from Last 3 Encounters:  02/18/18 189 lb (85.7 kg)  02/06/18 186 lb 3.2 oz (84.5 kg)  02/05/18 187 lb (84.8 kg)    EKG today demonstrates atypical atrial flutter, rate 116 Echo 01/2018 demonstrated EF 55-60%,  normal functioning AVR, mild MR, LA 47  Epic records are reviewed at length today  Assessment and Plan:  1. Persistent atrial fibrillation/atypical atrial flutter He remains in atypical atrial flutter today with RVR. Shortness of breath is improved with better rate control INR 5/16 3.2, 6/6 2.4 Continue Warfarin for CHADS2VASC of 4 Will check INR today and plan DCCV to restore SR. We discussed that he may need adjunctive AAD therapy in the future, but will proceed with cardioversion now and monitor for recurrence of atrial flutter  2. S/p mechanical AVR Functioning normally at last echo  3.  CAD No recent ischemic symptoms  4.  GI bleed Most recent 01/03/18 Followed by GI   Follow up with AF clinic after cardioversion    Chanetta Marshall, NP 02/19/2018 7:35 AM

## 2018-02-18 NOTE — Patient Instructions (Addendum)
Cardioversion scheduled for Monday, June 24th  - Have INR checked prior to arrival -- bring documentation of reading.  - Arrive at the [facility] and go to admitting at [place] not eat or drink anything after midnight the night prior to your procedure.  - Take all your  medication with a sip of water prior to arrival.  - You will not be able to drive home after your procedure.

## 2018-02-18 NOTE — Progress Notes (Signed)
Primary Care Physician: [name], MD Primary Cardiologist: Dawud Mays is a 84 y.o. male with a history of persistent atrial fibrillation and atrial flutter who presents for follow up in the [facility].  Since last being seen in clinic, the patient reports doing relataively well.  He remains in atypical atrial flutter today.  Today, he  denies symptoms of palpitations, chest pain, shortness of breath, orthopnea, PND, lower extremity edema, dizziness, presyncope, syncope, snoring, daytime somnolence, bleeding, or neurologic sequela. The patient is tolerating medications without difficulties and is otherwise without complaint today.    Atrial Fibrillation Risk Factors:  he does not have symptoms or diagnosis of sleep apnea.  he does not have a history of rheumatic fever.  he does not have a history of alcohol use.  he has a BMI of Body mass index is 27.12 kg/m.[name] Filed Weights   02/18/18 1446  Weight: 189 lb (85.7 kg)    LA size: 47   Atrial Fibrillation Management history:  Previous antiarrhythmic drugs: none  Previous cardioversions: 2017  Previous ablations: none  CHADS2VASC score: 4  Anticoagulation history: Warfarin (mechanical AVR)   Past Medical History:  Diagnosis Date  . Atrial fibrillation ([place])   . CAD (coronary artery disease) of artery bypass graft 2000   2000 with multiple bypasses   . Chronic anticoagulation    Coumadin therapy for mechanical aortic valve. Postoperative atrial fibrillation.   . Essential hypertension   . H/O mechanical aortic valve replacement 2000   St Jude AVR  . Hypothyroidism   . Premature ventricular contractions    Past Surgical History:  Procedure Laterality Date  . AORTIC VALVE REPLACEMENT (AVR)/CORONARY ARTERY BYPASS GRAFTING (CABG)  2000  . COLONOSCOPY WITH PROPOFOL N/A 01/08/2018   Procedure: COLONOSCOPY WITH PROPOFOL;  Surgeon: Arta Silence, MD;  [facility]: [facility];   Service: Gastroenterology;  Laterality: N/A;  . ESOPHAGOGASTRODUODENOSCOPY (EGD) WITH PROPOFOL N/A 01/06/2018   Procedure: ESOPHAGOGASTRODUODENOSCOPY (EGD) WITH PROPOFOL;  Surgeon: Otis Brace, MD;  [facility];  Service: Gastroenterology;  Laterality: N/A;    Current Outpatient Medications  Medication Sig Dispense Refill  . cholecalciferol (VITAMIN D) 1000 UNITS tablet Take 2,000 Units by mouth daily.     [name] COUMADIN 5 MG tablet Take 5-7.5 mg by mouth See admin instructions. 5 mg every Monday, Friday and Friday and  7.5 mg on all other days    . finasteride (PROSCAR) 5 MG tablet Take 5 mg by mouth daily.    . Iron TABS Take 1 tablet by mouth daily. Pt not sure of dosage     . levothyroxine (SYNTHROID, LEVOTHROID) 75 MCG tablet Take 75 mcg by mouth daily before breakfast.     . metoprolol tartrate (LOPRESSOR) 50 MG tablet Take 100 mg by mouth 2 (two) times daily.  3  . pantoprazole (PROTONIX) 40 MG tablet Take 1 tablet (40 mg total) by mouth 2 (two) times daily. 60 tablet 0  . rosuvastatin (CRESTOR) 20 MG tablet Take 20 mg by mouth daily. 3 times a week    . saccharomyces boulardii (FLORASTOR) 250 MG capsule Take 1 capsule (250 mg total) by mouth 2 (two) times daily. 30 capsule 0  . tamsulosin (FLOMAX) 0.4 MG CAPS capsule Take 0.8 mg by mouth daily. Take two 0.4mg  capsules by mouth daily. Total of 8 mg per day.    . witch hazel-glycerin (TUCKS) pad Apply topically as needed for itching. 40 each 12   No current  facility-administered medications for this encounter.     Allergies  Allergen Reactions  . Vicodin [Hydrocodone-Acetaminophen]     Severe sensitivity  . Zocor [Simvastatin]     Short memory loss.  [name] [Olmesartan]     Abdominal cramping and increased stools/ irritable bowel  . Codeine Nausea And Vomiting  . Lopid [Gemfibrozil] Other (See Comments)    transaminitis  . Monopril [Fosinopril]     transaminitis  . Phenergan [Promethazine Hcl]     Severe  somnolence.  . Prilosec [Omeprazole]     dyspepsia    Social History   Socioeconomic History  . Marital status: Married    Spouse name: Not on file  . Number of children: Not on file  . Years of education: Not on file  . Highest education level: Not on file  Occupational History  . Not on file  Social Needs  . Financial resource strain: Not on file  . Food insecurity:    Worry: Not on file    Inability: Not on file  . Transportation needs:    Medical: Not on file    Non-medical: Not on file  Tobacco Use  . Smoking status: Former Smoker    Types: Cigarettes  . Smokeless tobacco: Never Used  . Tobacco comment: quit 57 years ago  Substance and Sexual Activity  . Alcohol use: No  . Drug use: No  . Sexual activity: Not on file  Lifestyle  . Physical activity:    Days per week: Not on file    Minutes per session: Not on file  . Stress: Not on file  Relationships  . Social connections:    Talks on phone: Not on file    Gets together: Not on file    Attends religious service: Not on file    Active member of club or organization: Not on file    Attends meetings of clubs or organizations: Not on file    Relationship status: Not on file  . Intimate partner violence:    Fear of current or ex partner: Not on file    Emotionally abused: Not on file    Physically abused: Not on file    Forced sexual activity: Not on file  Other Topics Concern  . Not on file  Social History Narrative  . Not on file    Family History  Problem Relation Age of Onset  . Hypertension Mother   . Heart attack Mother   . Hypertension Father     ROS- All systems are reviewed and negative except as per the HPI above.  Physical Exam: Vitals:   02/18/18 1446  BP: (!) 158/94  Pulse: (!) 116  Weight: 189 lb (85.7 kg)  Height: 5\' 10"  (1.778 m)    GEN- The patient is elderly appearing, alert and oriented x 3 today.   Head- normocephalic, atraumatic Eyes-  Sclera clear, conjunctiva  pink Ears- hearing intact Oropharynx- clear Neck- supple  Lungs- Clear to ausculation bilaterally, normal work of breathing Heart- Tachycardic regular rate and rhythm  GI- soft, NT, ND, + BS Extremities- no clubbing, cyanosis, or edema MS- no significant deformity or atrophy Skin- no rash or lesion Psych- euthymic mood, full affect Neuro- strength and sensation are intact  Wt Readings from Last 3 Encounters:  02/18/18 189 lb (85.7 kg)  02/06/18 186 lb 3.2 oz (84.5 kg)  02/05/18 187 lb (84.8 kg)    EKG today demonstrates atypical atrial flutter, rate 116 Echo 01/2018 demonstrated EF 55-60%,  normal functioning AVR, mild MR, LA 47  Epic records are reviewed at length today  Assessment and Plan:  1. Persistent atrial fibrillation/atypical atrial flutter He remains in atypical atrial flutter today with RVR. Shortness of breath is improved with better rate control INR 5/16 3.2, 6/6 2.4 Continue Warfarin for CHADS2VASC of 4 Will check INR today and plan DCCV to restore SR. We discussed that he may need adjunctive AAD therapy in the future, but will proceed with cardioversion now and monitor for recurrence of atrial flutter  2. S/p mechanical AVR Functioning normally at last echo  3.  CAD No recent ischemic symptoms  4.  GI bleed Most recent 01/03/18 Followed by GI   Follow up with AF clinic after cardioversion    Chanetta Marshall, NP 02/19/2018 7:35 AM

## 2018-02-24 ENCOUNTER — Ambulatory Visit (HOSPITAL_COMMUNITY): Payer: Medicare Other | Admitting: Certified Registered"

## 2018-02-24 ENCOUNTER — Encounter (HOSPITAL_COMMUNITY): Payer: Self-pay | Admitting: *Deleted

## 2018-02-24 ENCOUNTER — Ambulatory Visit (HOSPITAL_COMMUNITY)
Admission: RE | Admit: 2018-02-24 | Discharge: 2018-02-24 | Disposition: A | Discharge status: Home / Self Care | Payer: Medicare Other | Source: Ambulatory Visit | Attending: Internal Medicine | Admitting: Internal Medicine

## 2018-02-24 ENCOUNTER — Encounter (HOSPITAL_COMMUNITY)
Admission: RE | Disposition: A | Discharge status: Home / Self Care | Payer: Self-pay | Source: Ambulatory Visit | Attending: Internal Medicine

## 2018-02-24 DIAGNOSIS — I10 Essential (primary) hypertension: Secondary | ICD-10-CM | POA: Insufficient documentation

## 2018-02-24 DIAGNOSIS — I484 Atypical atrial flutter: Secondary | ICD-10-CM | POA: Diagnosis not present

## 2018-02-24 DIAGNOSIS — I4891 Unspecified atrial fibrillation: Secondary | ICD-10-CM | POA: Diagnosis not present

## 2018-02-24 DIAGNOSIS — E039 Hypothyroidism, unspecified: Secondary | ICD-10-CM | POA: Insufficient documentation

## 2018-02-24 DIAGNOSIS — Z951 Presence of aortocoronary bypass graft: Secondary | ICD-10-CM | POA: Diagnosis not present

## 2018-02-24 DIAGNOSIS — I481 Persistent atrial fibrillation: Secondary | ICD-10-CM | POA: Diagnosis not present

## 2018-02-24 DIAGNOSIS — Z8249 Family history of ischemic heart disease and other diseases of the circulatory system: Secondary | ICD-10-CM | POA: Diagnosis not present

## 2018-02-24 DIAGNOSIS — Z952 Presence of prosthetic heart valve: Secondary | ICD-10-CM | POA: Diagnosis not present

## 2018-02-24 DIAGNOSIS — Z7901 Long term (current) use of anticoagulants: Secondary | ICD-10-CM | POA: Diagnosis not present

## 2018-02-24 DIAGNOSIS — Z87891 Personal history of nicotine dependence: Secondary | ICD-10-CM | POA: Insufficient documentation

## 2018-02-24 DIAGNOSIS — I2510 Atherosclerotic heart disease of native coronary artery without angina pectoris: Secondary | ICD-10-CM | POA: Insufficient documentation

## 2018-02-24 HISTORY — PX: CARDIOVERSION: SHX1299

## 2018-02-24 SURGERY — CARDIOVERSION
Anesthesia: General

## 2018-02-24 MED ORDER — LIDOCAINE 2% (20 MG/ML) 5 ML SYRINGE
INTRAMUSCULAR | Status: DC | PRN
  Administered 2018-02-24: 60 mg via INTRAVENOUS

## 2018-02-24 MED ORDER — SODIUM CHLORIDE 0.9 % IV SOLN
INTRAVENOUS | Status: DC | PRN
  Administered 2018-02-24: 11:00:00 via INTRAVENOUS

## 2018-02-24 MED ORDER — PROPOFOL 10 MG/ML IV BOLUS
INTRAVENOUS | Status: DC | PRN
  Administered 2018-02-24: 50 mg via INTRAVENOUS

## 2018-02-24 NOTE — CV Procedure (Signed)
Cardioversion  Patient anesthetized by anesthesia with propofol and lidocaine With pads in AP position, patient cardioverted to SR with 200J synchronized biphasic energy.   Telemetry initally showed frequent atrial ectopy  12 lead EKG pending   Procedure was without complication  Dorris Carnes

## 2018-02-24 NOTE — Transfer of Care (Signed)
Immediate Anesthesia Transfer of Care Note  Patient: Jason Santana  Procedure(s) Performed: CARDIOVERSION (N/A )  Patient Location: Endoscopy Unit  Anesthesia Type:General  Level of Consciousness: sedated  Airway & Oxygen Therapy: Patient Spontanous Breathing and Patient connected to nasal cannula oxygen  Post-op Assessment: Report given to RN, Post -op Vital signs reviewed and stable and Patient moving all extremities  Post vital signs: Reviewed and stable  Last Vitals:  Vitals Value Taken Time  BP    Temp    Pulse    Resp    SpO2      Last Pain:  Vitals:   02/24/18 0951  TempSrc: Oral  PainSc: 0-No pain         Complications: No apparent anesthesia complications

## 2018-02-24 NOTE — Anesthesia Postprocedure Evaluation (Signed)
Anesthesia Post Note  Patient: Jason Santana  Procedure(s) Performed: CARDIOVERSION (N/A )     Patient location during evaluation: Endoscopy Anesthesia Type: General Level of consciousness: awake and alert Pain management: pain level controlled Vital Signs Assessment: post-procedure vital signs reviewed and stable Respiratory status: spontaneous breathing, nonlabored ventilation and respiratory function stable Cardiovascular status: blood pressure returned to baseline and stable Postop Assessment: no apparent nausea or vomiting Anesthetic complications: no    Last Vitals:  Vitals:   02/24/18 1120 02/24/18 1121  BP:  96/64  Pulse: 64 64  Resp: (!) 22 (!) 22  Temp:    SpO2: 98% 98%    Last Pain:  Vitals:   02/24/18 1121  TempSrc:   PainSc: 0-No pain                 [name],W. EDMOND

## 2018-02-24 NOTE — Discharge Instructions (Signed)
Electrical Cardioversion, Care After °This sheet gives you information about how to care for yourself after your procedure. Your health care provider may also give you more specific instructions. If you have problems or questions, contact your health care provider. °What can I expect after the procedure? °After the procedure, it is common to have: °· Some redness on the skin where the shocks were given. ° °Follow these instructions at home: °· Do not drive for 24 hours if you were given a medicine to help you relax (sedative). °· Take over-the-counter and prescription medicines only as told by your health care provider. °· Ask your health care provider how to check your pulse. Check it often. °· Rest for 48 hours after the procedure or as told by your health care provider. °· Avoid or limit your caffeine use as told by your health care provider. °Contact a health care provider if: °· You feel like your heart is beating too quickly or your pulse is not regular. °· You have a serious muscle cramp that does not go away. °Get help right away if: °· You have discomfort in your chest. °· You are dizzy or you feel faint. °· You have trouble breathing or you are short of breath. °· Your speech is slurred. °· You have trouble moving an arm or leg on one side of your body. °· Your fingers or toes turn cold or blue. °This information is not intended to replace advice given to you by your health care provider. Make sure you discuss any questions you have with your health care provider. °Document Released: 06/10/2013 Document Revised: 03/23/2016 Document Reviewed: 02/24/2016 °Elsevier Interactive Patient Education © 2018 [employer] ° °

## 2018-02-24 NOTE — Anesthesia Preprocedure Evaluation (Addendum)
Anesthesia Evaluation  Patient identified by MRN, date of birth, ID band Patient awake    Reviewed: Allergy & Precautions, H&P , NPO status , Patient's Chart, lab work & pertinent test results, reviewed documented beta blocker date and time   Airway Mallampati: II  TM Distance: >3 FB Neck ROM: Full    Dental no notable dental hx. (+) Partial Lower, Partial Upper, Dental Advisory Given   Pulmonary neg pulmonary ROS, former smoker,    Pulmonary exam normal breath sounds clear to auscultation       Cardiovascular hypertension, Pt. on medications and Pt. on home beta blockers + CAD and + CABG  + dysrhythmias Atrial Fibrillation  Rhythm:Regular Rate:Tachycardia     Neuro/Psych negative neurological ROS  negative psych ROS   GI/Hepatic negative GI ROS, Neg liver ROS,   Endo/Other  Hypothyroidism   Renal/GU negative Renal ROS  negative genitourinary   Musculoskeletal   Abdominal   Peds  Hematology negative hematology ROS (+)   Anesthesia Other Findings   Reproductive/Obstetrics negative OB ROS                            Anesthesia Physical Anesthesia Plan  ASA: III  Anesthesia Plan: General   Post-op Pain Management:    Induction: Intravenous  PONV Risk Score and Plan: 2 and Treatment may vary due to age or medical condition  Airway Management Planned: Mask  Additional Equipment:   Intra-op Plan:   Post-operative Plan:   Informed Consent: I have reviewed the patients History and Physical, chart, labs and discussed the procedure including the risks, benefits and alternatives for the proposed anesthesia with the patient or authorized representative who has indicated his/her understanding and acceptance.   Dental advisory given  Plan Discussed with: CRNA  Anesthesia Plan Comments:         Anesthesia Quick Evaluation

## 2018-02-24 NOTE — Interval H&P Note (Signed)
History and Physical Interval Note:  02/24/2018 9:58 AM  Jason Santana  has presented today for surgery, with the diagnosis of AFIB  The various methods of treatment have been discussed with the patient and family. After consideration of risks, benefits and other options for treatment, the patient has consented to  Procedure(s): CARDIOVERSION (N/A) as a surgical intervention .  The patient's history has been reviewed, patient examined, no change in status, stable for surgery.  I have reviewed the patient's chart and labs.  Questions were answered to the patient's satisfaction.     Dorris Carnes

## 2018-02-25 ENCOUNTER — Encounter (HOSPITAL_COMMUNITY): Payer: Self-pay | Admitting: Internal Medicine

## 2018-02-25 ENCOUNTER — Telehealth: Payer: Self-pay | Admitting: Interventional Cardiology

## 2018-02-25 NOTE — Telephone Encounter (Signed)
Discussed with Roderic Palau NP will decrease metoprolol back down to 50mg  BID. Will hold off on restarting amlodipine until follow up for assessment. Pt verbalized understanding.

## 2018-02-25 NOTE — Telephone Encounter (Signed)
Pt calling   His medication was changed while he was in the hospital and pt want to know do he continue to stay on the medication which is Metoprolol  100mg  2x a day pt was taking 50mg  2x a day. Please advise pt  Amlodipine was stop also and pt want to know do he need to start back taking it.

## 2018-02-25 NOTE — Telephone Encounter (Signed)
Patient wonder if he can go back to Metoprolol 50 mg BID instead of 100 mg BID. Patient reported his HR at 58 and BP is running low.  Patient had recent cardioversion 02/18/18. Patient has follow up with Roderic Palau NP next week. Will forward to her for advisement.

## 2018-02-25 NOTE — Telephone Encounter (Signed)
Left message for patient to call back  

## 2018-02-26 ENCOUNTER — Other Ambulatory Visit (HOSPITAL_COMMUNITY): Payer: Self-pay | Admitting: Nurse Practitioner

## 2018-02-26 ENCOUNTER — Ambulatory Visit (HOSPITAL_COMMUNITY)
Admission: RE | Admit: 2018-02-26 | Discharge: 2018-02-26 | Disposition: A | Discharge status: Home / Self Care | Payer: Medicare Other | Source: Ambulatory Visit | Attending: Nurse Practitioner | Admitting: Nurse Practitioner

## 2018-02-26 DIAGNOSIS — R1031 Right lower quadrant pain: Secondary | ICD-10-CM | POA: Diagnosis not present

## 2018-02-26 DIAGNOSIS — K4090 Unilateral inguinal hernia, without obstruction or gangrene, not specified as recurrent: Secondary | ICD-10-CM | POA: Insufficient documentation

## 2018-02-26 DIAGNOSIS — R933 Abnormal findings on diagnostic imaging of other parts of digestive tract: Secondary | ICD-10-CM | POA: Insufficient documentation

## 2018-02-26 LAB — POCT I-STAT CREATININE: CREATININE: 1.3 mg/dL — AB (ref 0.61–1.24)

## 2018-02-26 MED ORDER — BARIUM SULFATE 2.1 % PO SUSP
ORAL | Status: AC
  Filled 2018-02-26: qty 1

## 2018-02-26 MED ORDER — IOHEXOL 300 MG/ML  SOLN
100.0000 mL | Freq: Once | INTRAMUSCULAR | Status: AC | PRN
  Administered 2018-02-26: 100 mL via INTRAVENOUS

## 2018-03-07 ENCOUNTER — Encounter (HOSPITAL_COMMUNITY): Payer: Self-pay | Admitting: Nurse Practitioner

## 2018-03-07 ENCOUNTER — Ambulatory Visit (HOSPITAL_COMMUNITY)
Admission: RE | Admit: 2018-03-07 | Discharge: 2018-03-07 | Disposition: A | Discharge status: Home / Self Care | Payer: Medicare Other | Source: Ambulatory Visit | Attending: Nurse Practitioner | Admitting: Nurse Practitioner

## 2018-03-07 VITALS — BP 146/76 | HR 55 | Ht 70.0 in | Wt 192.0 lb

## 2018-03-07 DIAGNOSIS — I484 Atypical atrial flutter: Secondary | ICD-10-CM | POA: Diagnosis not present

## 2018-03-07 DIAGNOSIS — Z87891 Personal history of nicotine dependence: Secondary | ICD-10-CM | POA: Insufficient documentation

## 2018-03-07 DIAGNOSIS — Z888 Allergy status to other drugs, medicaments and biological substances status: Secondary | ICD-10-CM | POA: Diagnosis not present

## 2018-03-07 DIAGNOSIS — Z79890 Hormone replacement therapy (postmenopausal): Secondary | ICD-10-CM | POA: Insufficient documentation

## 2018-03-07 DIAGNOSIS — K922 Gastrointestinal hemorrhage, unspecified: Secondary | ICD-10-CM | POA: Insufficient documentation

## 2018-03-07 DIAGNOSIS — Z79899 Other long term (current) drug therapy: Secondary | ICD-10-CM | POA: Insufficient documentation

## 2018-03-07 DIAGNOSIS — I444 Left anterior fascicular block: Secondary | ICD-10-CM | POA: Diagnosis not present

## 2018-03-07 DIAGNOSIS — I491 Atrial premature depolarization: Secondary | ICD-10-CM | POA: Diagnosis not present

## 2018-03-07 DIAGNOSIS — Z952 Presence of prosthetic heart valve: Secondary | ICD-10-CM | POA: Diagnosis not present

## 2018-03-07 DIAGNOSIS — I10 Essential (primary) hypertension: Secondary | ICD-10-CM | POA: Insufficient documentation

## 2018-03-07 DIAGNOSIS — I25810 Atherosclerosis of coronary artery bypass graft(s) without angina pectoris: Secondary | ICD-10-CM | POA: Diagnosis not present

## 2018-03-07 DIAGNOSIS — Z8249 Family history of ischemic heart disease and other diseases of the circulatory system: Secondary | ICD-10-CM | POA: Diagnosis not present

## 2018-03-07 DIAGNOSIS — I481 Persistent atrial fibrillation: Secondary | ICD-10-CM | POA: Diagnosis not present

## 2018-03-07 DIAGNOSIS — Z885 Allergy status to narcotic agent status: Secondary | ICD-10-CM | POA: Diagnosis not present

## 2018-03-07 DIAGNOSIS — E039 Hypothyroidism, unspecified: Secondary | ICD-10-CM | POA: Diagnosis not present

## 2018-03-07 DIAGNOSIS — Z7901 Long term (current) use of anticoagulants: Secondary | ICD-10-CM | POA: Diagnosis not present

## 2018-03-07 NOTE — Progress Notes (Signed)
Primary Care Physician: [name], MD Primary Cardiologist: Masin Shatto is a 84 y.o. male with a history of persistent atrial fibrillation and atrial flutter who presents for follow up in the [facility].  Since last being seen in clinic, the patient reports doing relataively well.  He remains in atypical atrial flutter today.  Today, he  denies symptoms of palpitations, chest pain, shortness of breath, orthopnea, PND, lower extremity edema, dizziness, presyncope, syncope, snoring, daytime somnolence, bleeding, or neurologic sequela. The patient is tolerating medications without difficulties and is otherwise without complaint today.   F/u in afib clinic, one week after cardioversion. DCCV was successful  and he remains in SR. No complaints voiced today. Unaware of any irregular HB since the procedure.   Atrial Fibrillation Risk Factors:  he does not have symptoms or diagnosis of sleep apnea.  he does not have a history of rheumatic fever.  he does not have a history of alcohol use.  he has a BMI of Body mass index is 27.55 kg/m.[name] Filed Weights   03/07/18 1054  Weight: 192 lb (87.1 kg)    LA size: 47   Atrial Fibrillation Management history:  Previous antiarrhythmic drugs: none  Previous cardioversions: 2017  Previous ablations: none  CHADS2VASC score: 4  Anticoagulation history: Warfarin (mechanical AVR)   Past Medical History:  Diagnosis Date  . Atrial fibrillation ([place])   . CAD (coronary artery disease) of artery bypass graft 2000   2000 with multiple bypasses   . Chronic anticoagulation    Coumadin therapy for mechanical aortic valve. Postoperative atrial fibrillation.   . Essential hypertension   . H/O mechanical aortic valve replacement 2000   St Jude AVR  . Hypothyroidism   . Premature ventricular contractions    Past Surgical History:  Procedure Laterality Date  . AORTIC VALVE REPLACEMENT (AVR)/CORONARY ARTERY  BYPASS GRAFTING (CABG)  2000  . CARDIOVERSION N/A 02/24/2018   Procedure: CARDIOVERSION;  Surgeon: Fay Records, MD;  Location: [facility];  Service: Cardiovascular;  Laterality: N/A;  . COLONOSCOPY WITH PROPOFOL N/A 01/08/2018   Procedure: COLONOSCOPY WITH PROPOFOL;  Surgeon: Arta Silence, MD;  Location: [facility];  Service: Gastroenterology;  Laterality: N/A;  . ESOPHAGOGASTRODUODENOSCOPY (EGD) WITH PROPOFOL N/A 01/06/2018   Procedure: ESOPHAGOGASTRODUODENOSCOPY (EGD) WITH PROPOFOL;  Surgeon: Otis Brace, MD;  Location: [facility];  Service: Gastroenterology;  Laterality: N/A;    Current Outpatient Medications  Medication Sig Dispense Refill  . acetaminophen (TYLENOL) 325 MG tablet Take 325 mg by mouth daily as needed for moderate pain or headache.    . Cholecalciferol (VITAMIN D) 2000 units tablet Take 2,000 Units by mouth daily.    [name] COUMADIN 5 MG tablet Take 5-7.5 mg by mouth See admin instructions. Take 5 mg by mouth once daily on Mon, Wed, and Fri, take 7.5 mg once daily on Tues, Thurs, Sat, and Sun    . diphenhydramine-acetaminophen (TYLENOL PM) 25-500 MG TABS tablet Take 0.5 tablets by mouth at bedtime as needed (sleep).    . ferrous sulfate 325 (65 FE) MG tablet Take 325 mg by mouth daily with supper.    . finasteride (PROSCAR) 5 MG tablet Take 5 mg by mouth at bedtime.     [name] levothyroxine (SYNTHROID, LEVOTHROID) 75 MCG tablet Take 75 mcg by mouth daily before breakfast.     . metoprolol tartrate (LOPRESSOR) 50 MG tablet Take 50 mg by mouth 2 (two) times daily.  3  . Multiple Vitamins-Minerals (  PRESERVISION AREDS 2) CAPS Take 1 capsule by mouth 2 (two) times daily.    . pantoprazole (PROTONIX) 40 MG tablet Take 1 tablet (40 mg total) by mouth 2 (two) times daily. 60 tablet 0  . Polyethyl Glycol-Propyl Glycol (SYSTANE OP) Place 1 drop into both eyes daily as needed (dry eyes).    . rosuvastatin (CRESTOR) 20 MG tablet Take 5 mg by mouth daily.     . tamsulosin (FLOMAX) 0.4  MG CAPS capsule Take 0.8 mg by mouth at bedtime.     [name] tobramycin (TOBREX) 0.3 % ophthalmic solution Place 1 drop into the left eye See admin instructions. Instill 1 drop into left eye 1 day before, the day of, and the day after eye injections administered at Dr's office     No current facility-administered medications for this encounter.     Allergies  Allergen Reactions  . Vicodin [Hydrocodone-Acetaminophen]     Severe sensitivity  . Zocor [Simvastatin]     Short memory loss.  [name] [Olmesartan]     Abdominal cramping and increased stools/ irritable bowel  . Codeine Nausea And Vomiting  . Lopid [Gemfibrozil] Other (See Comments)    transaminitis  . Monopril [Fosinopril]     transaminitis  . Phenergan [Promethazine Hcl]     Severe somnolence.  . Prilosec [Omeprazole]     dyspepsia    Social History   Socioeconomic History  . Marital status: Married    Spouse name: Not on file  . Number of children: Not on file  . Years of education: Not on file  . Highest education level: Not on file  Occupational History  . Not on file  Social Needs  . Financial resource strain: Not on file  . Food insecurity:    Worry: Not on file    Inability: Not on file  . Transportation needs:    Medical: Not on file    Non-medical: Not on file  Tobacco Use  . Smoking status: Former Smoker    Types: Cigarettes  . Smokeless tobacco: Never Used  . Tobacco comment: quit 57 years ago  Substance and Sexual Activity  . Alcohol use: No  . Drug use: No  . Sexual activity: Not on file  Lifestyle  . Physical activity:    Days per week: Not on file    Minutes per session: Not on file  . Stress: Not on file  Relationships  . Social connections:    Talks on phone: Not on file    Gets together: Not on file    Attends religious service: Not on file    Active member of club or organization: Not on file    Attends meetings of clubs or organizations: Not on file    Relationship status: Not on  file  . Intimate partner violence:    Fear of current or ex partner: Not on file    Emotionally abused: Not on file    Physically abused: Not on file    Forced sexual activity: Not on file  Other Topics Concern  . Not on file  Social History Narrative  . Not on file    Family History  Problem Relation Age of Onset  . Hypertension Mother   . Heart attack Mother   . Hypertension Father     ROS- All systems are reviewed and negative except as per the HPI above.  Physical Exam: Vitals:   03/07/18 1054  BP: (!) 146/76  Pulse: (!) 55  Weight:  192 lb (87.1 kg)  Height: 5\' 10"  (1.778 m)    GEN- The patient is elderly appearing, alert and oriented x 3 today.   Head- normocephalic, atraumatic Eyes-  Sclera clear, conjunctiva pink Ears- hearing intact Oropharynx- clear Neck- supple  Lungs- Clear to ausculation bilaterally, normal work of breathing Heart  regular rate and rhythm  GI- soft, NT, ND, + BS Extremities- no clubbing, cyanosis, or edema MS- no significant deformity or atrophy Skin- no rash or lesion Psych- euthymic mood, full affect Neuro- strength and sensation are intact  Wt Readings from Last 3 Encounters:  03/07/18 192 lb (87.1 kg)  02/24/18 189 lb (85.7 kg)  02/18/18 189 lb (85.7 kg)    EKG today demonstrates Sinus brady at 55 bpm, PR int 190 ms, qrs int 98 ms, qtc 487 ms LAFB Echo 01/2018 demonstrated EF 55-60%, normal functioning AVR, mild MR, LA 47  Epic records are reviewed at length today  Assessment and Plan:  1. Persistent atrial fibrillation/atypical atrial flutter Successful cardioversion INR per PCP Continue Warfarin for CHADS2VASC of 4 We discussed that he may need adjunctive AAD therapy in the future, if he has ERAF  2. S/p mechanical AVR Functioning normally at last echo  3.  CAD No recent ischemic symptoms  4.  GI bleed Most recent 01/03/18 Followed by GI   Follow up with AF clinic as needed PCP/Dr. Tamala Julian per recall   Roderic Palau, NP 03/07/2018 11:21 AM

## 2018-03-18 DIAGNOSIS — K4090 Unilateral inguinal hernia, without obstruction or gangrene, not specified as recurrent: Secondary | ICD-10-CM | POA: Diagnosis not present

## 2018-03-24 DIAGNOSIS — Z7901 Long term (current) use of anticoagulants: Secondary | ICD-10-CM | POA: Diagnosis not present

## 2018-04-02 DIAGNOSIS — H43813 Vitreous degeneration, bilateral: Secondary | ICD-10-CM | POA: Diagnosis not present

## 2018-04-02 DIAGNOSIS — H353221 Exudative age-related macular degeneration, left eye, with active choroidal neovascularization: Secondary | ICD-10-CM | POA: Diagnosis not present

## 2018-04-02 DIAGNOSIS — H353111 Nonexudative age-related macular degeneration, right eye, early dry stage: Secondary | ICD-10-CM | POA: Diagnosis not present

## 2018-04-21 DIAGNOSIS — Z7901 Long term (current) use of anticoagulants: Secondary | ICD-10-CM | POA: Diagnosis not present

## 2018-05-19 DIAGNOSIS — Z23 Encounter for immunization: Secondary | ICD-10-CM | POA: Diagnosis not present

## 2018-05-19 DIAGNOSIS — Z7901 Long term (current) use of anticoagulants: Secondary | ICD-10-CM | POA: Diagnosis not present

## 2018-06-16 DIAGNOSIS — Z7901 Long term (current) use of anticoagulants: Secondary | ICD-10-CM | POA: Diagnosis not present

## 2018-07-02 DIAGNOSIS — H353112 Nonexudative age-related macular degeneration, right eye, intermediate dry stage: Secondary | ICD-10-CM | POA: Diagnosis not present

## 2018-07-02 DIAGNOSIS — H353222 Exudative age-related macular degeneration, left eye, with inactive choroidal neovascularization: Secondary | ICD-10-CM | POA: Diagnosis not present

## 2018-07-02 DIAGNOSIS — H43813 Vitreous degeneration, bilateral: Secondary | ICD-10-CM | POA: Diagnosis not present

## 2018-07-02 DIAGNOSIS — H3561 Retinal hemorrhage, right eye: Secondary | ICD-10-CM | POA: Diagnosis not present

## 2018-07-14 DIAGNOSIS — Z7901 Long term (current) use of anticoagulants: Secondary | ICD-10-CM | POA: Diagnosis not present

## 2018-08-11 ENCOUNTER — Other Ambulatory Visit: Payer: Self-pay

## 2018-08-11 ENCOUNTER — Emergency Department (HOSPITAL_COMMUNITY): Payer: Medicare Other

## 2018-08-11 ENCOUNTER — Encounter (HOSPITAL_COMMUNITY): Payer: Self-pay | Admitting: Emergency Medicine

## 2018-08-11 ENCOUNTER — Inpatient Hospital Stay (HOSPITAL_COMMUNITY)
Admission: EM | Admit: 2018-08-11 | Discharge: 2018-08-18 | DRG: 287 | Disposition: A | Discharge status: Home / Self Care | Payer: Medicare Other | Attending: Internal Medicine | Admitting: Internal Medicine

## 2018-08-11 DIAGNOSIS — I484 Atypical atrial flutter: Principal | ICD-10-CM | POA: Diagnosis present

## 2018-08-11 DIAGNOSIS — Z79890 Hormone replacement therapy (postmenopausal): Secondary | ICD-10-CM

## 2018-08-11 DIAGNOSIS — I10 Essential (primary) hypertension: Secondary | ICD-10-CM | POA: Diagnosis present

## 2018-08-11 DIAGNOSIS — I129 Hypertensive chronic kidney disease with stage 1 through stage 4 chronic kidney disease, or unspecified chronic kidney disease: Secondary | ICD-10-CM | POA: Diagnosis present

## 2018-08-11 DIAGNOSIS — N400 Enlarged prostate without lower urinary tract symptoms: Secondary | ICD-10-CM | POA: Diagnosis present

## 2018-08-11 DIAGNOSIS — Z7901 Long term (current) use of anticoagulants: Secondary | ICD-10-CM | POA: Diagnosis not present

## 2018-08-11 DIAGNOSIS — Z8711 Personal history of peptic ulcer disease: Secondary | ICD-10-CM

## 2018-08-11 DIAGNOSIS — Z66 Do not resuscitate: Secondary | ICD-10-CM | POA: Diagnosis not present

## 2018-08-11 DIAGNOSIS — I25810 Atherosclerosis of coronary artery bypass graft(s) without angina pectoris: Secondary | ICD-10-CM | POA: Diagnosis not present

## 2018-08-11 DIAGNOSIS — I4892 Unspecified atrial flutter: Secondary | ICD-10-CM | POA: Diagnosis present

## 2018-08-11 DIAGNOSIS — Z952 Presence of prosthetic heart valve: Secondary | ICD-10-CM

## 2018-08-11 DIAGNOSIS — I97630 Postprocedural hematoma of a circulatory system organ or structure following a cardiac catheterization: Secondary | ICD-10-CM | POA: Diagnosis not present

## 2018-08-11 DIAGNOSIS — N183 Chronic kidney disease, stage 3 (moderate): Secondary | ICD-10-CM | POA: Diagnosis present

## 2018-08-11 DIAGNOSIS — R079 Chest pain, unspecified: Secondary | ICD-10-CM | POA: Diagnosis present

## 2018-08-11 DIAGNOSIS — I4891 Unspecified atrial fibrillation: Secondary | ICD-10-CM | POA: Diagnosis not present

## 2018-08-11 DIAGNOSIS — Z87891 Personal history of nicotine dependence: Secondary | ICD-10-CM

## 2018-08-11 DIAGNOSIS — E039 Hypothyroidism, unspecified: Secondary | ICD-10-CM | POA: Diagnosis not present

## 2018-08-11 DIAGNOSIS — I2582 Chronic total occlusion of coronary artery: Secondary | ICD-10-CM | POA: Diagnosis present

## 2018-08-11 DIAGNOSIS — K219 Gastro-esophageal reflux disease without esophagitis: Secondary | ICD-10-CM | POA: Diagnosis present

## 2018-08-11 DIAGNOSIS — Z9114 Patient's other noncompliance with medication regimen: Secondary | ICD-10-CM

## 2018-08-11 DIAGNOSIS — R0902 Hypoxemia: Secondary | ICD-10-CM | POA: Diagnosis not present

## 2018-08-11 DIAGNOSIS — R072 Precordial pain: Secondary | ICD-10-CM

## 2018-08-11 DIAGNOSIS — R0789 Other chest pain: Secondary | ICD-10-CM | POA: Diagnosis not present

## 2018-08-11 DIAGNOSIS — Z8249 Family history of ischemic heart disease and other diseases of the circulatory system: Secondary | ICD-10-CM

## 2018-08-11 DIAGNOSIS — I25110 Atherosclerotic heart disease of native coronary artery with unstable angina pectoris: Secondary | ICD-10-CM | POA: Diagnosis not present

## 2018-08-11 DIAGNOSIS — R42 Dizziness and giddiness: Secondary | ICD-10-CM | POA: Diagnosis not present

## 2018-08-11 DIAGNOSIS — E785 Hyperlipidemia, unspecified: Secondary | ICD-10-CM | POA: Diagnosis present

## 2018-08-11 DIAGNOSIS — Z79899 Other long term (current) drug therapy: Secondary | ICD-10-CM

## 2018-08-11 LAB — BASIC METABOLIC PANEL
Anion gap: 10 (ref 5–15)
BUN: 9 mg/dL (ref 8–23)
CO2: 26 mmol/L (ref 22–32)
Calcium: 9.8 mg/dL (ref 8.9–10.3)
Chloride: 103 mmol/L (ref 98–111)
Creatinine, Ser: 1.2 mg/dL (ref 0.61–1.24)
GFR calc Af Amer: >60 mL/min (ref >60)
GFR, EST NON AFRICAN AMERICAN: 55 mL/min — AB (ref >60)
Glucose, Bld: 127 mg/dL — ABNORMAL HIGH (ref 70–99)
Potassium: 4.2 mmol/L (ref 3.5–5.1)
SODIUM: 139 mmol/L (ref 135–145)

## 2018-08-11 LAB — CBC
HCT: 53.4 % — ABNORMAL HIGH (ref 39.0–52.0)
Hemoglobin: 16.5 g/dL (ref 13.0–17.0)
MCH: 32.9 pg (ref 26.0–34.0)
MCHC: 30.9 g/dL (ref 30.0–36.0)
MCV: 106.4 fL — ABNORMAL HIGH (ref 80.0–100.0)
NRBC: 0 % (ref 0.0–0.2)
PLATELETS: 206 10*3/uL (ref 150–400)
RBC: 5.02 MIL/uL (ref 4.22–5.81)
RDW: 12.8 % (ref 11.5–15.5)
WBC: 8.3 10*3/uL (ref 4.0–10.5)

## 2018-08-11 LAB — TROPONIN I: Troponin I: <0.03 ng/mL (ref <0.03)

## 2018-08-11 LAB — PROTIME-INR
INR: 2.65
Prothrombin Time: 27.9 seconds — ABNORMAL HIGH (ref 11.4–15.2)

## 2018-08-11 LAB — D-DIMER, QUANTITATIVE (NOT AT ARMC)

## 2018-08-11 LAB — I-STAT TROPONIN, ED: TROPONIN I, POC: 0 ng/mL (ref 0.00–0.08)

## 2018-08-11 MED ORDER — ACETAMINOPHEN 325 MG PO TABS
650.0000 mg | ORAL_TABLET | ORAL | Status: DC | PRN
  Administered 2018-08-16 – 2018-08-17 (×4): 650 mg via ORAL
  Filled 2018-08-11 (×4): qty 2

## 2018-08-11 MED ORDER — METOPROLOL TARTRATE 50 MG PO TABS
50.0000 mg | ORAL_TABLET | Freq: Two times a day (BID) | ORAL | Status: DC
  Administered 2018-08-12 – 2018-08-17 (×10): 50 mg via ORAL
  Filled 2018-08-11 (×12): qty 1

## 2018-08-11 MED ORDER — FINASTERIDE 5 MG PO TABS
5.0000 mg | ORAL_TABLET | Freq: Every day | ORAL | Status: DC
  Administered 2018-08-12 – 2018-08-17 (×7): 5 mg via ORAL
  Filled 2018-08-11 (×7): qty 1

## 2018-08-11 MED ORDER — SODIUM CHLORIDE 0.9 % IV SOLN
INTRAVENOUS | Status: DC
  Administered 2018-08-12: via INTRAVENOUS

## 2018-08-11 MED ORDER — SODIUM CHLORIDE 0.9 % IV BOLUS
250.0000 mL | Freq: Once | INTRAVENOUS | Status: AC
  Administered 2018-08-11: 250 mL via INTRAVENOUS

## 2018-08-11 MED ORDER — ROSUVASTATIN CALCIUM 5 MG PO TABS
5.0000 mg | ORAL_TABLET | Freq: Every day | ORAL | Status: DC
  Administered 2018-08-12 – 2018-08-18 (×6): 5 mg via ORAL
  Filled 2018-08-11 (×7): qty 1

## 2018-08-11 MED ORDER — ONDANSETRON HCL 4 MG/2ML IJ SOLN: 4.0000 mg | Freq: Four times a day (QID) | INTRAMUSCULAR | Status: DC | PRN

## 2018-08-11 MED ORDER — METOPROLOL TARTRATE 25 MG PO TABS
50.0000 mg | ORAL_TABLET | Freq: Once | ORAL | Status: AC
  Administered 2018-08-11: 50 mg via ORAL
  Filled 2018-08-11: qty 2

## 2018-08-11 MED ORDER — SODIUM CHLORIDE 0.9 % IV SOLN
INTRAVENOUS | Status: DC
  Administered 2018-08-11: 20:00:00 via INTRAVENOUS

## 2018-08-11 MED ORDER — TAMSULOSIN HCL 0.4 MG PO CAPS
0.4000 mg | ORAL_CAPSULE | Freq: Every day | ORAL | Status: DC
  Administered 2018-08-12 – 2018-08-17 (×7): 0.4 mg via ORAL
  Filled 2018-08-11 (×7): qty 1

## 2018-08-11 MED ORDER — LEVOTHYROXINE SODIUM 75 MCG PO TABS
75.0000 ug | ORAL_TABLET | Freq: Every day | ORAL | Status: DC
  Administered 2018-08-12 – 2018-08-18 (×7): 75 ug via ORAL
  Filled 2018-08-11 (×7): qty 1

## 2018-08-11 MED ORDER — MORPHINE SULFATE (PF) 2 MG/ML IV SOLN
2.0000 mg | INTRAVENOUS | Status: DC | PRN
  Administered 2018-08-17: 2 mg via INTRAVENOUS
  Filled 2018-08-11: qty 1

## 2018-08-11 MED ORDER — PANTOPRAZOLE SODIUM 40 MG PO TBEC
40.0000 mg | DELAYED_RELEASE_TABLET | Freq: Two times a day (BID) | ORAL | Status: DC
  Administered 2018-08-12 – 2018-08-18 (×13): 40 mg via ORAL
  Filled 2018-08-11 (×14): qty 1

## 2018-08-11 MED ORDER — WARFARIN - PHARMACIST DOSING INPATIENT: Freq: Every day | Status: DC

## 2018-08-11 NOTE — ED Notes (Signed)
admitting Provider at bedside. 

## 2018-08-11 NOTE — ED Triage Notes (Addendum)
Per EMS, pt reports chest tightness since 0600 today across the chest, no radiation. Pain 6/10. Pt went to fire station at 1500. Pt was in Afib, pt reports hx of irregular heart beat. Pt has hx of cardioversion. Pt recevied 324 asa and 1 nitro by ems with relief. EMS VS BP 157/104, P 111, R 20, 95% room air. Hx of HTN and aortic valve replacement, pt takes Warfarin.

## 2018-08-11 NOTE — ED Notes (Signed)
Pt aware of holding status, switched to hospital bed for comfort.

## 2018-08-11 NOTE — ED Notes (Signed)
ED TO INPATIENT HANDOFF REPORT  Name/Age/Gender Jason Santana 84 y.o. male  Code Status    Code Status Orders  (From admission, onward)         Start     Ordered   08/11/18 2251  Limited resuscitation (code)  Continuous    Question Answer Comment  In the event of cardiac or respiratory ARREST: Initiate Code Blue, Call Rapid Response Yes   In the event of cardiac or respiratory ARREST: Perform CPR No   In the event of cardiac or respiratory ARREST: Perform Intubation/Mechanical Ventilation No   In the event of cardiac or respiratory ARREST: Use NIPPV/BiPAp only if indicated Yes   In the event of cardiac or respiratory ARREST: Administer ACLS medications if indicated Yes   In the event of cardiac or respiratory ARREST: Perform Defibrillation or Cardioversion if indicated Yes      08/11/18 2251        Code Status History    Date Active Date Inactive Code Status Order ID Comments User Context   01/03/2018 1039 01/08/2018 1709 DNR [id number]  Tawni Millers, MD Inpatient   05/26/2017 0051 05/29/2017 1640 DNR [id number]  Toy Baker, MD ED    Advance Directive Documentation     Most Recent Value  Type of Advance Directive  Healthcare Power of Attorney, Living will  Pre-existing out of facility DNR order (yellow form or pink MOST form)  -  "MOST" Form in Place?  -      Home/SNF/Other Home  Chief Complaint Chest Pain  Level of Care/Admitting Diagnosis ED Disposition    ED Disposition Condition [place]: [place] [[id number]]  Level of Care: Telemetry [5]  I expect the patient will be discharged within 24 hours: No (not a candidate for 5C-Observation unit)  Diagnosis: Chest pain [086578]  Admitting Physician: Toy Baker [[id number]]  Attending Physician: Toy Baker [[id number]]  PT Class (Do Not Modify): Observation [104]  PT Acc Code (Do Not Modify): Observation [10022]       Medical History Past Medical  History:  Diagnosis Date  . Atrial fibrillation ([place])   . CAD (coronary artery disease) of artery bypass graft 2000   2000 with multiple bypasses   . Chronic anticoagulation    Coumadin therapy for mechanical aortic valve. Postoperative atrial fibrillation.   . Essential hypertension   . H/O mechanical aortic valve replacement 2000   St Jude AVR  . Hypothyroidism   . Premature ventricular contractions     Allergies Allergies  Allergen Reactions  . Vicodin [Hydrocodone-Acetaminophen]     Severe sensitivity  . Zocor [Simvastatin]     Short memory loss.  [name] [Olmesartan]     Abdominal cramping and increased stools/ irritable bowel  . Codeine Nausea And Vomiting  . Lopid [Gemfibrozil] Other (See Comments)    transaminitis  . Monopril [Fosinopril]     transaminitis  . Phenergan [Promethazine Hcl]     Severe somnolence.  . Prilosec [Omeprazole]     dyspepsia    IV Location/Drains/Wounds Patient Lines/Drains/Airways Status   Active Line/Drains/Airways    Name:   Placement date:   Placement time:   Site:   Days:   Peripheral IV 08/11/18 Left Forearm   08/11/18    1600    Forearm   less than 1          Labs/Imaging Results for orders placed or performed during the hospital encounter of 08/11/18 (from the past  48 hour(s))  CBC     Status: Abnormal   Collection Time: 08/11/18  4:17 PM  Result Value Ref Range   WBC 8.3 4.0 - 10.5 K/uL   RBC 5.02 4.22 - 5.81 MIL/uL   Hemoglobin 16.5 13.0 - 17.0 g/dL   HCT 53.4 (H) 39.0 - 52.0 %   MCV 106.4 (H) 80.0 - 100.0 fL   MCH 32.9 26.0 - 34.0 pg   MCHC 30.9 30.0 - 36.0 g/dL   RDW 12.8 11.5 - 15.5 %   Platelets 206 150 - 400 K/uL   nRBC 0.0 0.0 - 0.2 %    Comment: Performed at [facility], [place] [address] [place], [place] [address]  Basic metabolic panel     Status: Abnormal   Collection Time: 08/11/18  4:17 PM  Result Value Ref Range   Sodium 139 135 - 145 mmol/L   Potassium 4.2 3.5 - 5.1 mmol/L   Chloride 103 98  - 111 mmol/L   CO2 26 22 - 32 mmol/L   Glucose, Bld 127 (H) 70 - 99 mg/dL   BUN 9 8 - 23 mg/dL   Creatinine, Ser 1.20 0.61 - 1.24 mg/dL   Calcium 9.8 8.9 - 10.3 mg/dL   GFR calc non Af Amer 55 (L) >60 mL/min   GFR calc Af Amer >60 >60 mL/min   Anion gap 10 5 - 15    Comment: Performed at [facility] [address], [place], [place] [address]  I-stat troponin, ED     Status: None   Collection Time: 08/11/18  4:37 PM  Result Value Ref Range   Troponin i, poc 0.00 0.00 - 0.08 ng/mL   Comment 3            Comment: Due to the release kinetics of cTnI, a negative result within the first hours of the onset of symptoms does not rule out myocardial infarction with certainty. If myocardial infarction is still suspected, repeat the test at appropriate intervals.   D-dimer, quantitative (not at [facility])     Status: None   Collection Time: 08/11/18  5:36 PM  Result Value Ref Range   D-Dimer, Quant <0.27 0.00 - 0.50 ug/mL-FEU    Comment: (NOTE) At the manufacturer cut-off of 0.50 ug/mL FEU, this assay has been documented to exclude PE with a sensitivity and negative predictive value of 97 to 99%.  At this time, this assay has not been approved by the FDA to exclude DVT/VTE. Results should be correlated with clinical presentation. Performed at [facility], [place] [address], [place], [place] [address]   Troponin I - Now Then Q6H     Status: None   Collection Time: 08/11/18  7:55 PM  Result Value Ref Range   Troponin I <0.03 <0.03 ng/mL    Comment: Performed at [facility] [address], [place], [place] [address]  Protime-INR     Status: Abnormal   Collection Time: 08/11/18  7:55 PM  Result Value Ref Range   Prothrombin Time 27.9 (H) 11.4 - 15.2 seconds   INR 2.65     Comment: Performed at [facility] [address] [place], [place] [address]   Dg Chest 2 View  Result Date: 08/11/2018 CLINICAL DATA:  Chest tightness EXAM: CHEST - 2 VIEW COMPARISON:   02/05/2018 FINDINGS: Post sternotomy changes and valve prosthesis. No acute airspace disease or effusion. Cardiomediastinal silhouette within normal limits. Aortic atherosclerosis. No pneumothorax. IMPRESSION:  No active cardiopulmonary disease. Electronically Signed   By: Donavan Foil M.D.   On: 08/11/2018 18:03   EKG Interpretation  Date/Time:  Monday August 11 2018 16:05:22 EST Ventricular Rate:  109 PR Interval:    QRS Duration: 111 QT Interval:  404 QTC Calculation: 545 R Axis:   -65 Text Interpretation:  Sinus or ectopic atrial tachycardia Abnormal R-wave progression, late transition LVH with IVCD, LAD and secondary repol abnrm Prolonged QT interval Baseline wander in lead(s) I II aVR aVL V1 New since previous tracing Confirmed by Fredia Sorrow [phone]) on 08/11/2018 4:20:59 PM Also confirmed by Fredia Sorrow [phone]), editor Philomena Doheny [phone])  on 08/11/2018 4:44:28 PM   Pending Labs Unresulted Labs (From admission, onward)    Start     Ordered   08/12/18 0500  Lipid panel  Tomorrow morning,   R     08/11/18 2204   08/12/18 0500  Protime-INR  Daily,   R     08/11/18 2233   08/11/18 1909  Troponin I - Now Then Q6H  Now then every 6 hours,   R     08/11/18 1908   Signed and Held  Troponin I - Now Then Q3H  Now then every 3 hours,   TIMED     Signed and Held          Vitals/Pain Today's Vitals   08/11/18 2131 08/11/18 2200 08/11/18 2230 08/11/18 2300  BP:  (!) 136/93 120/63 114/80  Pulse:  92 93 94  Resp:  20 18 15   Temp:      TempSrc:      SpO2:  95% 94% 94%  Weight:      Height:      PainSc: 0-No pain       Isolation Precautions No active isolations  Medications Medications  0.9 %  sodium chloride infusion ( Intravenous New Bag/Given 08/11/18 1952)  rosuvastatin (CRESTOR) tablet 5 mg (has no administration in time range)  metoprolol tartrate (LOPRESSOR) tablet 50 mg (50 mg Oral Not Given 08/11/18 2254)  levothyroxine (SYNTHROID, LEVOTHROID) tablet 75 mcg  (has no administration in time range)  pantoprazole (PROTONIX) EC tablet 40 mg (has no administration in time range)  finasteride (PROSCAR) tablet 5 mg (has no administration in time range)  tamsulosin (FLOMAX) capsule 0.4 mg (has no administration in time range)  morphine 2 MG/ML injection 2 mg (has no administration in time range)  acetaminophen (TYLENOL) tablet 650 mg (has no administration in time range)  ondansetron (ZOFRAN) injection 4 mg (has no administration in time range)  0.9 %  sodium chloride infusion (has no administration in time range)  Warfarin - Pharmacist Dosing Inpatient (has no administration in time range)  sodium chloride 0.9 % bolus 250 mL (0 mLs Intravenous Stopped 08/11/18 2021)  metoprolol tartrate (LOPRESSOR) tablet 50 mg (50 mg Oral Given 08/11/18 1946)    Mobility walks

## 2018-08-11 NOTE — Progress Notes (Addendum)
ANTICOAGULATION CONSULT NOTE - Initial Consult  Pharmacy Consult for warfarin Indication: Atrial Fibrillation + mechanical AVR  Patient Measurements: Height: 5\' 10"  (177.8 cm) Weight: 187 lb (84.8 kg) IBW/kg (Calculated) : 73   Vital Signs: Temp: 97.8 F (36.6 C) (12/09 1604) Temp Source: Temporal (12/09 1604) BP: 142/90 (12/09 2130) Pulse Rate: 97 (12/09 2130)  Labs: Recent Labs    08/11/18 1617 08/11/18 1955  HGB 16.5  --   HCT 53.4*  --   PLT 206  --   LABPROT  --  27.9*  INR  --  2.65  CREATININE 1.20  --   TROPONINI  --  <0.03     Medical History: Past Medical History:  Diagnosis Date  . Atrial fibrillation ([place])   . CAD (coronary artery disease) of artery bypass graft 2000   2000 with multiple bypasses   . Chronic anticoagulation    Coumadin therapy for mechanical aortic valve. Postoperative atrial fibrillation.   . Essential hypertension   . H/O mechanical aortic valve replacement 2000   [employer] AVR  . Hypothyroidism   . Premature ventricular contractions     Assessment: 84 yo male with AFib. On warfarin PTA, current INR is therapeutic at 2.6. Last dose this morning. PTA warfarin: 5 mg on Mon/Wed/Fri, 7.5 mg on all other days  Goal of Therapy:  INR 2-3 Monitor platelets by anticoagulation protocol: Yes    Plan:  -Hold warfarin tonight, next dose tomorrow pending INR -Daily INR  Susanne Baumgarner, Jake Church 08/11/2018,10:12 PM

## 2018-08-11 NOTE — ED Provider Notes (Signed)
[facility] EMERGENCY DEPARTMENT Provider Note   CSN: [id number] Arrival date & time: 08/11/18  1557     History   Chief Complaint Chief Complaint  Patient presents with  . Chest Pain    HPI Jason Santana is a 84 y.o. male.  Patient with onset of chest tightness at 6 this morning across the chest no radiation.  Pain was 6 out of 10 patient went to the fire station at 1500.  Patient was noted to be in A. fib.  Patient has a history of irregular heartbeat specifically has a history of atrial flutter.  Is on Coumadin for that.  Patient's also had a aortic valve replacement.  There he received aspirin and 1 nitro and had complete relief.  Patient's oxygen saturations on room air were 95%.  Patient's had bypass surgery in the past.  That was in 2000.     Past Medical History:  Diagnosis Date  . Atrial fibrillation ([place])   . CAD (coronary artery disease) of artery bypass graft 2000   2000 with multiple bypasses   . Chronic anticoagulation    Coumadin therapy for mechanical aortic valve. Postoperative atrial fibrillation.   . Essential hypertension   . H/O mechanical aortic valve replacement 2000   [employer] AVR  . Hypothyroidism   . Premature ventricular contractions     Patient Active Problem List   Diagnosis Date Noted  . Atypical atrial flutter ([place]) 02/07/2018  . Syncope 01/04/2018  . GI bleed 01/03/2018  . Macular degeneration of left eye 05/26/2017  . Hypothyroidism 05/26/2017  . Hyponatremia 05/25/2017  . Sepsis ([place]) 05/25/2017  . Muscle weakness 12/13/2014  . Paroxysmal atrial flutter ([place]) 11/25/2013  . Essential hypertension 11/25/2013  . H/O mechanical aortic valve replacement 11/25/2013  . CAD (coronary artery disease) of artery bypass graft 11/25/2013  . Chronic anticoagulation 11/25/2013  . Premature ventricular contractions 11/25/2013    Past Surgical History:  Procedure Laterality Date  . AORTIC VALVE REPLACEMENT (AVR)/CORONARY  ARTERY BYPASS GRAFTING (CABG)  2000  . CARDIOVERSION N/A 02/24/2018   Procedure: CARDIOVERSION;  Surgeon: Fay Records, MD;  Location: [facility];  Service: Cardiovascular;  Laterality: N/A;  . COLONOSCOPY WITH PROPOFOL N/A 01/08/2018   Procedure: COLONOSCOPY WITH PROPOFOL;  Surgeon: Arta Silence, MD;  Location: [facility];  Service: Gastroenterology;  Laterality: N/A;  . ESOPHAGOGASTRODUODENOSCOPY (EGD) WITH PROPOFOL N/A 01/06/2018   Procedure: ESOPHAGOGASTRODUODENOSCOPY (EGD) WITH PROPOFOL;  Surgeon: Otis Brace, MD;  Location: [facility];  Service: Gastroenterology;  Laterality: N/A;        Home Medications    Prior to Admission medications   Medication Sig Start Date End Date Taking? Authorizing Provider  acetaminophen (TYLENOL) 325 MG tablet Take 325 mg by mouth daily as needed for moderate pain or headache.    [provider]  Cholecalciferol (VITAMIN D) 2000 units tablet Take 2,000 Units by mouth daily.    [provider]  COUMADIN 5 MG tablet Take 5-7.5 mg by mouth See admin instructions. Take 5 mg by mouth once daily on Mon, Wed, and Fri, take 7.5 mg once daily on Tues, Thurs, Sat, and Sun 09/18/13   [provider]  diphenhydramine-acetaminophen (TYLENOL PM) 25-500 MG TABS tablet Take 0.5 tablets by mouth at bedtime as needed (sleep).    [provider]  ferrous sulfate 325 (65 FE) MG tablet Take 325 mg by mouth daily with supper.    [provider]  finasteride (PROSCAR) 5 MG tablet Take  5 mg by mouth at bedtime.  10/07/17   [provider]  levothyroxine (SYNTHROID, LEVOTHROID) 75 MCG tablet Take 75 mcg by mouth daily before breakfast.  10/06/13   [provider]  metoprolol tartrate (LOPRESSOR) 50 MG tablet Take 50 mg by mouth 2 (two) times daily. 02/06/18   [provider]  Multiple Vitamins-Minerals (PRESERVISION AREDS 2) CAPS Take 1 capsule by mouth 2 (two) times daily.    [provider]    pantoprazole (PROTONIX) 40 MG tablet Take 1 tablet (40 mg total) by mouth 2 (two) times daily. 01/08/18   Raiford Noble Latif, DO  Polyethyl Glycol-Propyl Glycol (SYSTANE OP) Place 1 drop into both eyes daily as needed (dry eyes).    [provider]  rosuvastatin (CRESTOR) 20 MG tablet Take 5 mg by mouth daily.     [provider]  tamsulosin (FLOMAX) 0.4 MG CAPS capsule Take 0.8 mg by mouth at bedtime.     [provider]  tobramycin (TOBREX) 0.3 % ophthalmic solution Place 1 drop into the left eye See admin instructions. Instill 1 drop into left eye 1 day before, the day of, and the day after eye injections administered at Dr's office    [provider]    Family History Family History  Problem Relation Age of Onset  . Hypertension Mother   . Heart attack Mother   . Hypertension Father     Social History Social History   Tobacco Use  . Smoking status: Former Smoker    Types: Cigarettes  . Smokeless tobacco: Never Used  . Tobacco comment: quit 57 years ago  Substance Use Topics  . Alcohol use: No  . Drug use: No     Allergies   Vicodin [hydrocodone-acetaminophen]; Zocor [simvastatin]; Benicar [olmesartan]; Codeine; Lopid [gemfibrozil]; Monopril [fosinopril]; Phenergan [promethazine hcl]; and Prilosec [omeprazole]   Review of Systems Review of Systems  Constitutional: Negative for fever.  HENT: Negative for congestion.   Eyes: Negative for redness.  Respiratory: Negative for shortness of breath.   Cardiovascular: Positive for chest pain. Negative for palpitations.  Gastrointestinal: Negative for abdominal pain.  Genitourinary: Negative for dysuria.  Musculoskeletal: Negative for back pain.  Neurological: Negative for syncope.  Hematological: Bruises/bleeds easily.  Psychiatric/Behavioral: Negative for confusion.     Physical Exam Updated Vital Signs BP (!) 156/85   Pulse (!) 109   Temp 97.8 F (36.6 C) (Temporal)   Resp 18    Ht 1.778 m (5\' 10" )   Wt 84.8 kg   SpO2 99%   BMI 26.83 kg/m   Physical Exam  Constitutional: He is oriented to person, place, and time. He appears well-developed and well-nourished. No distress.  HENT:  Head: Normocephalic and atraumatic.  Mouth/Throat: Oropharynx is clear and moist.  Eyes: Pupils are equal, round, and reactive to light. Conjunctivae and EOM are normal.  Neck: Neck supple.  Cardiovascular: Normal rate, regular rhythm and normal heart sounds.  Pulmonary/Chest: Effort normal and breath sounds normal. No respiratory distress.  Abdominal: Soft. Bowel sounds are normal. There is no tenderness.  Musculoskeletal: Normal range of motion. He exhibits no edema.  Neurological: He is alert and oriented to person, place, and time. No cranial nerve deficit or sensory deficit. He exhibits normal muscle tone. Coordination normal.  Nursing note and vitals reviewed.    ED Treatments / Results  Labs (all labs ordered are listed, but only abnormal results are displayed) Labs Reviewed  CBC - Abnormal; Notable for the following components:  Result Value   HCT 53.4 (*)    MCV 106.4 (*)    All other components within normal limits  BASIC METABOLIC PANEL - Abnormal; Notable for the following components:   Glucose, Bld 127 (*)    GFR calc non Af Amer 55 (*)    All other components within normal limits  D-DIMER, QUANTITATIVE (NOT AT [facility])  I-STAT TROPONIN, ED    EKG EKG Interpretation  Date/Time:  Monday August 11 2018 16:05:22 EST Ventricular Rate:  109 PR Interval:    QRS Duration: 111 QT Interval:  404 QTC Calculation: 545 R Axis:   -65 Text Interpretation:  Sinus or ectopic atrial tachycardia Abnormal R-wave progression, late transition LVH with IVCD, LAD and secondary repol abnrm Prolonged QT interval Baseline wander in lead(s) I II aVR aVL V1 New since previous tracing Confirmed by Fredia Sorrow [phone]) on 08/11/2018 4:20:59 PM Also confirmed by Fredia Sorrow  [phone]), editor Philomena Doheny [phone])  on 08/11/2018 4:44:28 PM   Radiology Dg Chest 2 View  Result Date: 08/11/2018 CLINICAL DATA:  Chest tightness EXAM: CHEST - 2 VIEW COMPARISON:  02/05/2018 FINDINGS: Post sternotomy changes and valve prosthesis. No acute airspace disease or effusion. Cardiomediastinal silhouette within normal limits. Aortic atherosclerosis. No pneumothorax. IMPRESSION: No active cardiopulmonary disease. Electronically Signed   By: Donavan Foil M.D.   On: 08/11/2018 18:03    Procedures Procedures (including critical care time)  Medications Ordered in ED Medications - No data to display   Initial Impression / Assessment and Plan / ED Course  I have reviewed the triage vital signs and the nursing notes.  Pertinent labs & imaging results that were available during my care of the patient were reviewed by me and considered in my medical decision making (see chart for details).    Patient with onset of chest pain is 6 this morning.  Was constant throughout the day.  Patient has a history of coronary artery disease even though he denies it.  Also history of aortic valve replacement history of atrial fib flutter on Coumadin for that.  Patient's initial troponin was negative.  Pain went away with nitroglycerin given by EMS.  Patient with persistent tachycardia.  He is on a beta-blocker could be due to that.  D-dimer was done ruling out pulmonary embolus.  Chest x-ray without any acute findings.  EKG significant for poor R wave progression.  Discussed with hospitalist they will admit patient has remained pain-free here.  We will also get INR also will give a little bit of a fluid challenge.   Final Clinical Impressions(s) / ED Diagnoses   Final diagnoses:  Precordial pain    ED Discharge Orders    None       Fredia Sorrow, MD 08/11/18 2018

## 2018-08-11 NOTE — ED Notes (Signed)
ED Provider at bedside. 

## 2018-08-11 NOTE — ED Notes (Signed)
Patient transported to X-ray 

## 2018-08-11 NOTE — H&P (Addendum)
KOLBI ALTADONNA YBW:[id number] DOB: 01/29/1932 DOA: 08/11/2018     PCP: [name], MD   Outpatient Specialists:  CARDS: Dr. Tamala Julian     Patient arrived to ER on 08/11/18 at 1557  Patient coming from: home Lives  With family    Chief Complaint:  Chief Complaint  Patient presents with  . Chest Pain    HPI: Jason Santana is a 84 y.o. male with medical history significant of hypertension, mechanical aortic valve replacement on Coumadin, atrial fibrillation sp cardioversion X 2, CAD status post cardiac bypass, hypothyroidism    Presented with chest pain across the chest started at 6 AM initially at 6 AM out of 10 he went to fire station by 3 PM noted to be in atrial fibrillation patient has history of the same and have had cardioversion in the past at the fire station he received 324 of aspirin and 1 nitro with some improvement vital signs at that time noted blood pressure 157/104 pulse 111 satting 95% room air   Wife states he has been drinking fluids but have been exerting himself extra getting christmas decorations   Regarding pertinent Chronic problems:  Tree of atrial fibrillation on metoprolol 50 mg twice a day and coumadin Not on aspirin due to hx of PUD  history of CAD on Crestor  While in ER: Initial trop neg D.dimer neg   After getting second dose of metoprolol now HR coming dow to 90's The following Work up has been ordered so far:  Orders Placed This Encounter  Procedures  . DG Chest 2 View  . CBC  . Basic metabolic panel  . D-dimer, quantitative (not at [facility])  . Diet Heart Room service appropriate? Yes; Fluid consistency: Thin  . Cardiac monitoring  . Consult to hospitalist  . I-stat troponin, ED  . EKG 12-Lead  . ED EKG  . Saline lock IV    Following Medications were ordered in ER: Medications - No data to display  Significant initial  Findings: Abnormal Labs Reviewed  CBC - Abnormal; Notable for the following components:      Result Value   HCT 53.4 (*)    MCV 106.4 (*)    All other components within normal limits  BASIC METABOLIC PANEL - Abnormal; Notable for the following components:   Glucose, Bld 127 (*)    GFR calc non Af Amer 55 (*)    All other components within normal limits   Lactic Acid, Venous    Component Value Date/Time   LATICACIDVEN 1.6 05/26/2017 0240    Na 139 K 4.2  Cr  stable, Lab Results  Component Value Date   CREATININE 1.20 08/11/2018   CREATININE 1.30 (H) 02/26/2018   CREATININE 1.18 02/18/2018      WBC  8.3  HG/HCT  stable    Component Value Date/Time   HGB 16.5 08/11/2018 1617   HCT 53.4 (H) 08/11/2018 1617    Troponin (Point of Care Test) Recent Labs    08/11/18 1637  TROPIPOC 0.00      UA not ordered  CXR -  NON acute    ECG:  Personally reviewed by me showing: HR : 109 Rhythm: Atrial ectopic atrial tachycardia with abnormal R wave progression nonspecific changes, no evidence of ischemic changes QTC 545    ED Triage Vitals  Enc Vitals Group     BP 08/11/18 1604 (!) 178/93     Pulse Rate 08/11/18 1604 (!) 110  Resp 08/11/18 1604 18     Temp 08/11/18 1604 97.8 F (36.6 C)     Temp Source 08/11/18 1604 Temporal     SpO2 08/11/18 1600 95 %     Weight 08/11/18 1602 187 lb (84.8 kg)     Height 08/11/18 1602 5\' 10"  (1.778 m)     Head Circumference --      Peak Flow --      Pain Score 08/11/18 1602 0     Pain Loc --      Pain Edu? --      Excl. in [place]? --   TMAX(24)@       Latest  Blood pressure (!) 156/85, pulse (!) 109, temperature 97.8 F (36.6 C), temperature source Temporal, resp. rate 18, height 5\' 10"  (1.778 m), weight 84.8 kg, SpO2 99 %.   Hospitalist was called for admission for chest pain evaluation   Review of Systems:    Pertinent positives include:  chest pain,  Constitutional:  No weight loss, night sweats, Fevers, chills, fatigue, weight loss  HEENT:  No headaches, Difficulty swallowing,Tooth/dental problems,Sore throat,  No  sneezing, itching, ear ache, nasal congestion, post nasal drip,  Cardio-vascular:  No Orthopnea, PND, anasarca, dizziness, palpitations.no Bilateral lower extremity swelling  GI:  No heartburn, indigestion, abdominal pain, nausea, vomiting, diarrhea, change in bowel habits, loss of appetite, melena, blood in stool, hematemesis Resp:  no shortness of breath at rest. No dyspnea on exertion, No excess mucus, no productive cough, No non-productive cough, No coughing up of blood.No change in color of mucus.No wheezing. Skin:  no rash or lesions. No jaundice GU:  no dysuria, change in color of urine, no urgency or frequency. No straining to urinate.  No flank pain.  Musculoskeletal:  No joint pain or no joint swelling. No decreased range of motion. No back pain.  Psych:  No change in mood or affect. No depression or anxiety. No memory loss.  Neuro: no localizing neurological complaints, no tingling, no weakness, no double vision, no gait abnormality, no slurred speech, no confusion  All systems reviewed and apart from [place] all are negative  Past Medical History:   Past Medical History:  Diagnosis Date  . Atrial fibrillation ([place])   . CAD (coronary artery disease) of artery bypass graft 2000   2000 with multiple bypasses   . Chronic anticoagulation    Coumadin therapy for mechanical aortic valve. Postoperative atrial fibrillation.   . Essential hypertension   . H/O mechanical aortic valve replacement 2000   St Jude AVR  . Hypothyroidism   . Premature ventricular contractions       Past Surgical History:  Procedure Laterality Date  . AORTIC VALVE REPLACEMENT (AVR)/CORONARY ARTERY BYPASS GRAFTING (CABG)  2000  . CARDIOVERSION N/A 02/24/2018   Procedure: CARDIOVERSION;  Surgeon: Fay Records, MD;  Location: [place];  Service: Cardiovascular;  Laterality: N/A;  . COLONOSCOPY WITH PROPOFOL N/A 01/08/2018   Procedure: COLONOSCOPY WITH PROPOFOL;  Surgeon: Arta Silence, MD;   Location: [facility];  Service: Gastroenterology;  Laterality: N/A;  . ESOPHAGOGASTRODUODENOSCOPY (EGD) WITH PROPOFOL N/A 01/06/2018   Procedure: ESOPHAGOGASTRODUODENOSCOPY (EGD) WITH PROPOFOL;  Surgeon: Otis Brace, MD;  Location: [facility];  Service: Gastroenterology;  Laterality: N/A;    Social History:  Ambulatory  Independently     reports that he has quit smoking. His smoking use included cigarettes. He has never used smokeless tobacco. He reports that he does not drink alcohol or use drugs.  Family History:  Family History  Problem Relation Age of Onset  . Hypertension Mother   . Heart attack Mother   . Hypertension Father     Allergies: Allergies  Allergen Reactions  . Vicodin [Hydrocodone-Acetaminophen]     Severe sensitivity  . Zocor [Simvastatin]     Short memory loss.  [name] [Olmesartan]     Abdominal cramping and increased stools/ irritable bowel  . Codeine Nausea And Vomiting  . Lopid [Gemfibrozil] Other (See Comments)    transaminitis  . Monopril [Fosinopril]     transaminitis  . Phenergan [Promethazine Hcl]     Severe somnolence.  . Prilosec [Omeprazole]     dyspepsia     Prior to Admission medications   Medication Sig Start Date End Date Taking? Authorizing Provider  acetaminophen (TYLENOL) 325 MG tablet Take 325 mg by mouth daily as needed for moderate pain or headache.   Yes [provider]  Cholecalciferol (VITAMIN D) 2000 units tablet Take 2,000 Units by mouth daily.   Yes [provider]  COUMADIN 5 MG tablet Take 5-7.5 mg by mouth See admin instructions. Take 5 mg by mouth once daily on Mon, Wed, and Fri, take 7.5 mg once daily on Tues, Thurs, Sat, and Sun 09/18/13  Yes [provider]  diphenhydramine-acetaminophen (TYLENOL PM) 25-500 MG TABS tablet Take 0.5 tablets by mouth at bedtime as needed (sleep).   Yes [provider]  ferrous sulfate 325 (65 FE) MG tablet Take 325 mg by mouth daily  with supper.   Yes [provider]  finasteride (PROSCAR) 5 MG tablet Take 5 mg by mouth at bedtime.  10/07/17  Yes [provider]  levothyroxine (SYNTHROID, LEVOTHROID) 75 MCG tablet Take 75 mcg by mouth daily before breakfast.  10/06/13  Yes [provider]  metoprolol tartrate (LOPRESSOR) 50 MG tablet Take 50 mg by mouth 2 (two) times daily. 02/06/18  Yes [provider]  Multiple Vitamins-Minerals (PRESERVISION AREDS 2) CAPS Take 1 capsule by mouth 2 (two) times daily.   Yes [provider]  pantoprazole (PROTONIX) 40 MG tablet Take 1 tablet (40 mg total) by mouth 2 (two) times daily. 01/08/18  Yes Sheikh, Omair Latif, DO  Polyethyl Glycol-Propyl Glycol (SYSTANE OP) Place 1 drop into both eyes daily as needed (dry eyes).   Yes [provider]  rosuvastatin (CRESTOR) 20 MG tablet Take 5 mg by mouth daily.    Yes [provider]  tamsulosin (FLOMAX) 0.4 MG CAPS capsule Take 0.4 mg by mouth at bedtime.    Yes [provider]  tobramycin (TOBREX) 0.3 % ophthalmic solution Place 1 drop into the left eye See admin instructions. Instill 1 drop into left eye 1 day before, the day of, and the day after eye injections administered at Dr's office   Yes [provider]   Physical Exam: Blood pressure (!) 156/85, pulse (!) 109, temperature 97.8 F (36.6 C), temperature source Temporal, resp. rate 18, height 5\' 10"  (1.778 m), weight 84.8 kg, SpO2 99 %. 1. General:  in No Acute distress  well -appearing 2. Psychological: Alert and   Oriented 3. Head/ENT:    Dry Mucous Membranes                          Head Non traumatic, neck supple  Poor Dentition 4. SKIN:   decreased Skin turgor,  Skin clean Dry and intact no rash 5. Heart: Regular rate and rhythm click Murmur, no Rub or gallop 6. Lungs no wheezes or crackles   7. Abdomen: Soft, non-tender, Non distended      bowel sounds present 8. Lower extremities:  no clubbing, cyanosis, or  edema 9. Neurologically Grossly intact, moving all 4 extremities equally  10. MSK: Normal range of motion   LABS:     Recent Labs  Lab 08/11/18 1617  WBC 8.3  HGB 16.5  HCT 53.4*  MCV 106.4*  PLT 010   Basic Metabolic Panel: Recent Labs  Lab 08/11/18 1617  NA 139  K 4.2  CL 103  CO2 26  GLUCOSE 127*  BUN 9  CREATININE 1.20  CALCIUM 9.8      No results for input(s): AST, ALT, ALKPHOS, BILITOT, PROT, ALBUMIN in the last 168 hours. No results for input(s): LIPASE, AMYLASE in the last 168 hours. No results for input(s): AMMONIA in the last 168 hours.    HbA1C: No results for input(s): HGBA1C in the last 72 hours. CBG: No results for input(s): GLUCAP in the last 168 hours.    Urine analysis:    Component Value Date/Time   COLORURINE STRAW (A) 02/05/2018 1941   APPEARANCEUR CLEAR 02/05/2018 1941   LABSPEC 1.004 (L) 02/05/2018 1941   PHURINE 6.0 02/05/2018 1941   GLUCOSEU NEGATIVE 02/05/2018 1941   HGBUR NEGATIVE 02/05/2018 1941   BILIRUBINUR NEGATIVE 02/05/2018 1941   KETONESUR NEGATIVE 02/05/2018 1941   PROTEINUR NEGATIVE 02/05/2018 1941   NITRITE NEGATIVE 02/05/2018 1941   LEUKOCYTESUR NEGATIVE 02/05/2018 1941      Cultures:    Component Value Date/Time   SDES BLOOD LEFT ARM 05/27/2017 0937   SPECREQUEST  05/27/2017 0937    BOTTLES DRAWN AEROBIC AND ANAEROBIC Blood Culture adequate volume   CULT  05/27/2017 0937    NO GROWTH 5 DAYS Performed at [facility], [place] [address], [place], [place] [address]    REPTSTATUS 06/01/2017 FINAL 05/27/2017 6644     Radiological Exams on Admission: Dg Chest 2 View  Result Date: 08/11/2018 CLINICAL DATA:  Chest tightness EXAM: CHEST - 2 VIEW COMPARISON:  02/05/2018 FINDINGS: Post sternotomy changes and valve prosthesis. No acute airspace disease or effusion. Cardiomediastinal silhouette within normal limits. Aortic atherosclerosis. No pneumothorax. IMPRESSION: No active  cardiopulmonary disease. Electronically Signed   By: Donavan Foil M.D.   On: 08/11/2018 18:03    Chart has been reviewed   Assessment/Plan   84 y.o. male with medical history significant of hypertension, mechanical aortic valve replacement on Coumadin, atrial fibrillation, CAD status post cardiac bypass, hypothyroidism Admitted for chest pain   Present on Admission: . Chest pain - - H=  1  ,E= 1   ,A=  2   , R   2   , T 0  ,  for the  Total of 6 therefore will admit for observation and further evaluation ( Risk of MACE: Scores 0-3  of 0.9-1.7%.,  4-6: 12-16.6% , Scores ?7: 50-65% ) Test pain in the setting of tachycardia secondary to A. fib with RVR currently improved Also had exertional component      - monitor on telemetry, cycle cardiac enzymes, obtain serial ECG and  ECHO in AM.   - not on  Aspirin due to GI bleed in the past  -  Further risk stratify with lipid panel, hgA1C, obtain TSH.  We will notify cardiology regarding patient's admission. Further management depends on pending  workup  . Atypical atrial flutter (HCC) -           - CHA2DS2 vas score  : continue current anticoagulation with Coumadin per pharmacy,            -  Rate control:  Currently controlled with  Metoprolol, will continue      . CAD (coronary artery disease) of artery bypass graft beta-blocker and statin patient has not been compliant with statin recently.  Not on aspirin given history of GI bleeds . Essential hypertension restart metoprolol . Hypothyroidism - - Check TSH continue home medications at current dose  Hx of Mechanical aortic valve on coumadin will continue - eval with Echo in AM     Other plan as per orders.  DVT prophylaxis:  On coumadin    Code Status:  limited  as per patient  I had personally discussed CODE STATUS with patient and family  Family Communication:   Family  at  Bedside  plan of care was discussed with   Wife,   Disposition Plan:       To home once workup is  complete and patient is stable                                    Consults called:   Email  Cardiology   Admission status:    Obs    Level of care    tele  For 12H   Daphanie Oquendo 08/11/2018, 9:53 PM    [facility]  Pager [phone]   after 2 AM please page floor coverage PA If 7AM-7PM, please contact the day team taking care of the patient  [email or web address]  Password [email or web address]

## 2018-08-12 ENCOUNTER — Observation Stay (HOSPITAL_BASED_OUTPATIENT_CLINIC_OR_DEPARTMENT_OTHER): Payer: Medicare Other

## 2018-08-12 DIAGNOSIS — Z8711 Personal history of peptic ulcer disease: Secondary | ICD-10-CM | POA: Diagnosis not present

## 2018-08-12 DIAGNOSIS — M79602 Pain in left arm: Secondary | ICD-10-CM | POA: Diagnosis not present

## 2018-08-12 DIAGNOSIS — Z79899 Other long term (current) drug therapy: Secondary | ICD-10-CM | POA: Diagnosis not present

## 2018-08-12 DIAGNOSIS — I4892 Unspecified atrial flutter: Secondary | ICD-10-CM

## 2018-08-12 DIAGNOSIS — I484 Atypical atrial flutter: Principal | ICD-10-CM

## 2018-08-12 DIAGNOSIS — E039 Hypothyroidism, unspecified: Secondary | ICD-10-CM | POA: Diagnosis not present

## 2018-08-12 DIAGNOSIS — I2582 Chronic total occlusion of coronary artery: Secondary | ICD-10-CM | POA: Diagnosis not present

## 2018-08-12 DIAGNOSIS — E785 Hyperlipidemia, unspecified: Secondary | ICD-10-CM | POA: Diagnosis not present

## 2018-08-12 DIAGNOSIS — R079 Chest pain, unspecified: Secondary | ICD-10-CM | POA: Diagnosis not present

## 2018-08-12 DIAGNOSIS — M7989 Other specified soft tissue disorders: Secondary | ICD-10-CM | POA: Diagnosis not present

## 2018-08-12 DIAGNOSIS — I25700 Atherosclerosis of coronary artery bypass graft(s), unspecified, with unstable angina pectoris: Secondary | ICD-10-CM

## 2018-08-12 DIAGNOSIS — I97630 Postprocedural hematoma of a circulatory system organ or structure following a cardiac catheterization: Secondary | ICD-10-CM | POA: Diagnosis not present

## 2018-08-12 DIAGNOSIS — I340 Nonrheumatic mitral (valve) insufficiency: Secondary | ICD-10-CM

## 2018-08-12 DIAGNOSIS — I4891 Unspecified atrial fibrillation: Secondary | ICD-10-CM | POA: Diagnosis not present

## 2018-08-12 DIAGNOSIS — Z7901 Long term (current) use of anticoagulants: Secondary | ICD-10-CM | POA: Diagnosis not present

## 2018-08-12 DIAGNOSIS — N183 Chronic kidney disease, stage 3 (moderate): Secondary | ICD-10-CM | POA: Diagnosis not present

## 2018-08-12 DIAGNOSIS — N400 Enlarged prostate without lower urinary tract symptoms: Secondary | ICD-10-CM | POA: Diagnosis not present

## 2018-08-12 DIAGNOSIS — Z9114 Patient's other noncompliance with medication regimen: Secondary | ICD-10-CM | POA: Diagnosis not present

## 2018-08-12 DIAGNOSIS — K219 Gastro-esophageal reflux disease without esophagitis: Secondary | ICD-10-CM | POA: Diagnosis not present

## 2018-08-12 DIAGNOSIS — I25110 Atherosclerotic heart disease of native coronary artery with unstable angina pectoris: Secondary | ICD-10-CM | POA: Diagnosis not present

## 2018-08-12 DIAGNOSIS — Z952 Presence of prosthetic heart valve: Secondary | ICD-10-CM

## 2018-08-12 DIAGNOSIS — Z8249 Family history of ischemic heart disease and other diseases of the circulatory system: Secondary | ICD-10-CM | POA: Diagnosis not present

## 2018-08-12 DIAGNOSIS — Z87891 Personal history of nicotine dependence: Secondary | ICD-10-CM | POA: Diagnosis not present

## 2018-08-12 DIAGNOSIS — R072 Precordial pain: Secondary | ICD-10-CM | POA: Diagnosis not present

## 2018-08-12 DIAGNOSIS — I10 Essential (primary) hypertension: Secondary | ICD-10-CM | POA: Diagnosis not present

## 2018-08-12 DIAGNOSIS — Z79890 Hormone replacement therapy (postmenopausal): Secondary | ICD-10-CM | POA: Diagnosis not present

## 2018-08-12 DIAGNOSIS — I129 Hypertensive chronic kidney disease with stage 1 through stage 4 chronic kidney disease, or unspecified chronic kidney disease: Secondary | ICD-10-CM | POA: Diagnosis not present

## 2018-08-12 DIAGNOSIS — Z66 Do not resuscitate: Secondary | ICD-10-CM | POA: Diagnosis not present

## 2018-08-12 LAB — HEMOGLOBIN A1C
Hgb A1c MFr Bld: 5.7 % — ABNORMAL HIGH (ref 4.8–5.6)
Mean Plasma Glucose: 116.89 mg/dL

## 2018-08-12 LAB — TSH: TSH: 4.958 u[IU]/mL — ABNORMAL HIGH (ref 0.350–4.500)

## 2018-08-12 LAB — PROTIME-INR
INR: 2.67
Prothrombin Time: 28 seconds — ABNORMAL HIGH (ref 11.4–15.2)

## 2018-08-12 LAB — ECHOCARDIOGRAM COMPLETE
Height: 70 in
Weight: 2948.87 oz

## 2018-08-12 LAB — TROPONIN I
Troponin I: <0.03 ng/mL (ref <0.03)
Troponin I: <0.03 ng/mL (ref <0.03)

## 2018-08-12 LAB — LIPID PANEL
Cholesterol: 199 mg/dL (ref 0–200)
HDL: 26 mg/dL — ABNORMAL LOW (ref >40)
LDL Cholesterol: 125 mg/dL — ABNORMAL HIGH (ref 0–99)
TRIGLYCERIDES: 241 mg/dL — AB (ref <150)
Total CHOL/HDL Ratio: 7.7 RATIO
VLDL: 48 mg/dL — ABNORMAL HIGH (ref 0–40)

## 2018-08-12 LAB — MRSA PCR SCREENING: MRSA by PCR: NEGATIVE

## 2018-08-12 MED ORDER — AMIODARONE LOAD VIA INFUSION
150.0000 mg | Freq: Once | INTRAVENOUS | Status: AC
  Administered 2018-08-12: 150 mg via INTRAVENOUS
  Filled 2018-08-12: qty 83.34

## 2018-08-12 MED ORDER — AMIODARONE HCL IN DEXTROSE 360-4.14 MG/200ML-% IV SOLN
30.0000 mg/h | INTRAVENOUS | Status: DC
  Administered 2018-08-13 – 2018-08-15 (×4): 30 mg/h via INTRAVENOUS
  Filled 2018-08-12 (×5): qty 200

## 2018-08-12 MED ORDER — WARFARIN SODIUM 7.5 MG PO TABS: 7.5000 mg | ORAL_TABLET | Freq: Once | ORAL | Status: DC

## 2018-08-12 MED ORDER — AMIODARONE HCL IN DEXTROSE 360-4.14 MG/200ML-% IV SOLN
60.0000 mg/h | INTRAVENOUS | Status: AC
  Administered 2018-08-12 (×2): 60 mg/h via INTRAVENOUS
  Filled 2018-08-12 (×2): qty 200

## 2018-08-12 NOTE — Progress Notes (Signed)
[place] for heparin (hold warfarin) Indication: Atrial Fibrillation + mechanical AVR  Patient Measurements: Height: 5\' 10"  (177.8 cm) Weight: 184 lb 4.9 oz (83.6 kg) IBW/kg (Calculated) : 73   Vital Signs: Temp: 98.1 F (36.7 C) (12/10 1203) Temp Source: Oral (12/10 1203) BP: 125/83 (12/10 1203) Pulse Rate: 110 (12/10 1203)  Labs: Recent Labs    08/11/18 1617 08/11/18 1955 08/12/18 0225 08/12/18 0703  HGB 16.5  --   --   --   HCT 53.4*  --   --   --   PLT 206  --   --   --   LABPROT  --  27.9*  --  28.0*  INR  --  2.65  --  2.67  CREATININE 1.20  --   --   --   TROPONINI  --  <0.03 <0.03 <0.03     Medical History: Past Medical History:  Diagnosis Date  . Atrial fibrillation ([place])   . CAD (coronary artery disease) of artery bypass graft 2000   2000 with multiple bypasses   . Chronic anticoagulation    Coumadin therapy for mechanical aortic valve. Postoperative atrial fibrillation.   . Essential hypertension   . H/O mechanical aortic valve replacement 2000   St Jude AVR  . Hypothyroidism   . Premature ventricular contractions     Assessment: 84 yo male with AFib. On warfarin PTA, current INR is therapeutic x 2 at 2.6. Last dose 12/9 am. PTA warfarin: 5 mg on Mon/Wed/Fri, 7.5 mg on all other days  Goal of Therapy:  INR 2-3 Monitor platelets by anticoagulation protocol: Yes    Plan:  -Hold warfarin tonight -Start IV heparin when INR < 2 -F/u AM INR.  Marguerite Olea, [facility] Clinical Pharmacist Phone [phone]  08/12/2018 12:40 PM

## 2018-08-12 NOTE — Progress Notes (Signed)
ANTICOAGULATION CONSULT NOTE  Pharmacy Consult for warfarin Indication: Atrial Fibrillation + mechanical AVR  Patient Measurements: Height: 5\' 10"  (177.8 cm) Weight: 184 lb 4.9 oz (83.6 kg) IBW/kg (Calculated) : 73   Vital Signs: Temp: 97.6 F (36.4 C) (12/10 0002) Temp Source: Oral (12/10 0906) BP: 122/97 (12/10 0906) Pulse Rate: 116 (12/10 0906)  Labs: Recent Labs    08/11/18 1617 08/11/18 1955 08/12/18 0225 08/12/18 0703  HGB 16.5  --   --   --   HCT 53.4*  --   --   --   PLT 206  --   --   --   LABPROT  --  27.9*  --  28.0*  INR  --  2.65  --  2.67  CREATININE 1.20  --   --   --   TROPONINI  --  <0.03 <0.03 <0.03     Medical History: Past Medical History:  Diagnosis Date  . Atrial fibrillation ([place])   . CAD (coronary artery disease) of artery bypass graft 2000   2000 with multiple bypasses   . Chronic anticoagulation    Coumadin therapy for mechanical aortic valve. Postoperative atrial fibrillation.   . Essential hypertension   . H/O mechanical aortic valve replacement 2000   St Jude AVR  . Hypothyroidism   . Premature ventricular contractions     Assessment: 84 yo male with AFib. On warfarin PTA, current INR is therapeutic x 2 at 2.6. Last dose 12/9 am. PTA warfarin: 5 mg on Mon/Wed/Fri, 7.5 mg on all other days  Goal of Therapy:  INR 2-3 Monitor platelets by anticoagulation protocol: Yes    Plan:  -Warfarin 7.5mg  tonight -Daily INR  Erin Hearing PharmD., BCPS Clinical Pharmacist 08/12/2018 11:43 AM

## 2018-08-12 NOTE — Progress Notes (Signed)
  Echocardiogram 2D Echocardiogram has been performed.  Jennette Dubin 08/12/2018, 8:51 AM

## 2018-08-12 NOTE — Progress Notes (Signed)
PROGRESS NOTE                                                                                                                                                                                                             Patient Demographics:    Jason Santana, is a 84 y.o. male, DOB - [birth year], KXF:[id number]  Admit date - 08/11/2018   Admitting Physician Jason Baker, MD  Outpatient Primary MD for the patient is Jason Huddle, MD  LOS - 0   Chief Complaint  Patient presents with  . Chest Pain       Brief Narrative    85 y.o. male with medical history significant of hypertension, mechanical aortic valve replacement on Coumadin, atrial fibrillation, CAD status post cardiac bypass, hypothyroidism Admitted for chest pain , noted to have to be in A. fib with RVR, cardiology has been consulted.   Subjective:    Jason Santana today has, No headache, No chest pain, No abdominal pain - No Nausea, reports his pain resolved by nitro on admission yesterday, no recurrence since .   Assessment  & Plan :    Active Problems:   Paroxysmal atrial flutter (HCC)   Essential hypertension   H/O mechanical aortic valve replacement   CAD (coronary artery disease) of artery bypass graft   Chronic anticoagulation   Hypothyroidism   Atypical atrial flutter (HCC)   Chest pain   Chest pain - prolonged episode, worsened by exertion, by nitro, MI ruled out with 3- troponins, 2D echo with a preserved EF, no regional wall motion abnormalities. -Per cardiology likely will need cardiac cath, warfarin has been stopped, and heparin GTT per pharmacy  Chronic a flutter  -Continue with metoprolol 50 mg oral twice daily, is on IV amiodarone , chads 2 vascular score of 4, continue with anticoagulation, currently on warfarin, will transition to heparin GTT .  CAD (coronary artery disease) of artery bypass graft  -Neurology on board   Hypertension  -Continue with  metoprolol   Hypothyroidism  -Continue with home dose Synthroid, follow on TSH   Hx of Mechanical aortic valve  - on coumadin , stable on echocardiogram obtained today      Code Status : partial  Family Communication  : Jason Santana at bedside  Disposition Plan  : home  Barriers for discharge: Heart rate uncontrolled, currently  on IV amiodarone, as well will need cardiac cath  Consults  :  cardiology  Procedures  : none  DVT Prophylaxis  :  warfarin  Lab Results  Component Value Date   PLT 206 08/11/2018    Antibiotics  :    Anti-infectives (From admission, onward)   None        Objective:   Vitals:   08/11/18 2336 08/12/18 0002 08/12/18 0906 08/12/18 1203  BP: 128/85 (!) 151/68 (!) 122/97 125/83  Pulse: (!) 102 86 (!) 116 (!) 110  Resp: (!) 26 18  (!) 23  Temp:  97.6 F (36.4 C)  98.1 F (36.7 C)  TempSrc:  Oral Oral Oral  SpO2: 94% 96% 96% 97%  Weight:  83.6 kg    Height:  5\' 10"  (1.778 m)      Wt Readings from Last 3 Encounters:  08/12/18 83.6 kg  03/07/18 87.1 kg  02/24/18 85.7 kg     Intake/Output Summary (Last 24 hours) at 08/12/2018 1221 Last data filed at 08/12/2018 0900 Gross per 24 hour  Intake 193.1 ml  Output 800 ml  Net -606.9 ml     Physical Exam  Awake Alert, Oriented X 3, No new F.N deficits, Normal affect Symmetrical Chest wall movement, Good air movement bilaterally, CTAB Irregular irregular,No Gallops,Rubs , mechanical click  +ve B.Sounds, Abd Soft, No tenderness,  No rebound - guarding or rigidity. No Cyanosis, Clubbing or edema, No new Rash or bruise      Data Review:    CBC Recent Labs  Lab 08/11/18 1617  WBC 8.3  HGB 16.5  HCT 53.4*  PLT 206  MCV 106.4*  MCH 32.9  MCHC 30.9  RDW 12.8    Chemistries  Recent Labs  Lab 08/11/18 1617  NA 139  K 4.2  CL 103  CO2 26  GLUCOSE 127*  BUN 9  CREATININE 1.20  CALCIUM 9.8    ------------------------------------------------------------------------------------------------------------------ Recent Labs    08/12/18 0225  CHOL 199  HDL 26*  LDLCALC 125*  TRIG 241*  CHOLHDL 7.7    Lab Results  Component Value Date   HGBA1C 5.7 (H) 08/12/2018   ------------------------------------------------------------------------------------------------------------------ No results for input(s): TSH, T4TOTAL, T3FREE, THYROIDAB in the last 72 hours.  Invalid input(s): FREET3 ------------------------------------------------------------------------------------------------------------------ No results for input(s): VITAMINB12, FOLATE, FERRITIN, TIBC, IRON, RETICCTPCT in the last 72 hours.  Coagulation profile Recent Labs  Lab 08/11/18 1955 08/12/18 0703  INR 2.65 2.67    Recent Labs    08/11/18 1736  DDIMER <0.27    Cardiac Enzymes Recent Labs  Lab 08/11/18 1955 08/12/18 0225 08/12/18 0703  TROPONINI <0.03 <0.03 <0.03   ------------------------------------------------------------------------------------------------------------------ No results found for: BNP  Inpatient Medications  Scheduled Meds: . amiodarone  150 mg Intravenous Once  . finasteride  5 mg Oral QHS  . levothyroxine  75 mcg Oral QAC breakfast  . metoprolol tartrate  50 mg Oral BID  . pantoprazole  40 mg Oral BID  . rosuvastatin  5 mg Oral Daily  . tamsulosin  0.4 mg Oral QHS  . warfarin  7.5 mg Oral ONCE-1800  . Warfarin - Pharmacist Dosing Inpatient   Does not apply q1800   Continuous Infusions: . sodium chloride 0 mL/hr at 08/12/18 0000  . sodium chloride 75 mL/hr at 08/12/18 0300  . amiodarone     Followed by  . amiodarone     PRN Meds:.acetaminophen, morphine injection, ondansetron (ZOFRAN) IV  Micro Results Recent Results (from the past 240 hour(s))  MRSA PCR Screening     Status: None   Collection Time: 08/12/18 12:59 AM  Result Value Ref Range Status   MRSA by  PCR NEGATIVE NEGATIVE Final    Comment:        The GeneXpert MRSA Assay (FDA approved for NASAL specimens only), is one component of a comprehensive MRSA colonization surveillance program. It is not intended to diagnose MRSA infection nor to guide or monitor treatment for MRSA infections. Performed at [facility], [place] [address], [place], [place] [address]     Radiology Reports Dg Chest 2 View  Result Date: 08/11/2018 CLINICAL DATA:  Chest tightness EXAM: CHEST - 2 VIEW COMPARISON:  02/05/2018 FINDINGS: Post sternotomy changes and valve prosthesis. No acute airspace disease or effusion. Cardiomediastinal silhouette within normal limits. Aortic atherosclerosis. No pneumothorax. IMPRESSION: No active cardiopulmonary disease. Electronically Signed   By: Donavan Foil M.D.   On: 08/11/2018 18:03      Phillips Climes M.D on 08/12/2018 at 12:21 PM  Between 7am to 7pm - Pager - [phone]  After 7pm go to [email or web address] - password [email or web address]  [facility] -  Office  [phone]

## 2018-08-12 NOTE — Plan of Care (Signed)
  Problem: Education: Goal: Knowledge of General Education information will improve Description Including pain rating scale, medication(s)/side effects and non-pharmacologic comfort measures Outcome: Progressing   Problem: Clinical Measurements: Goal: Ability to maintain clinical measurements within normal limits will improve Outcome: Progressing Goal: Will remain free from infection Outcome: Progressing Goal: Respiratory complications will improve Outcome: Progressing Goal: Cardiovascular complication will be avoided Outcome: Progressing   Problem: Nutrition: Goal: Adequate nutrition will be maintained Outcome: Progressing   Problem: Coping: Goal: Level of anxiety will decrease Outcome: Progressing

## 2018-08-12 NOTE — Consult Note (Addendum)
The patient has been seen in conjunction with [name], PAC. All aspects of care have been considered and discussed. The patient has been personally interviewed, examined, and all clinical data has been reviewed.   Recurrent A. fib/flutter with rapid ventricular response.  Had cardioversion earlier this year.  During previous episode, there was no chest discomfort.  Current plan is to initiate IV amiodarone, discontinue Coumadin and start heparin once INR is 2.2 or less, and consider electrical cardioversion later this week if he does not have pharmacologic conversion with amiodarone.  Because of prolonged chest discomfort associated with this, he needs coronary angiography to define bypass graft anatomy.  Ischemia could be driving recurrent atrial arrhythmias.  His bypass grafts are greater than 27 years old.  This was discussed with the patient.  He was also informed that this would be a relatively lengthy hospital stay.   Cardiology Consultation:   Patient ID: Jason Santana MRN: [id number]; DOB: 09-07-1931  Admit date: 08/11/2018 Date of Consult: 08/12/2018  Primary Care Provider: [name], MD Primary Cardiologist: Sinclair Grooms, MD  Primary Electrophysiologist:  [facility]   Patient Profile:   Jason Santana is a 84 y.o. male with a hx of hypertension, hypothyroidism, chronic atrial flutter on Coumadin, history of mechanical aortic valve replacement and history of CAD s/p CABG in 2000 who is being seen today for the evaluation of chest pain and recurrent atrial flutter at the request of Dr. Waldron Labs.  History of Present Illness:   Mr. Brannen is a pleasant 84 year old patient with past medical history of hypertension, hypothyroidism, chronic atrial flutter on Coumadin, history of mechanical aortic valve replacement and history of CAD s/p CABG in 2000.  Patient has had multiple DCCV in the past.  Patient presented to [facility] ED in June or 2019 with atrial flutter, his  metoprolol was increased to 75 mg twice daily.  He was seen by Dr. Saunders Revel the following day who increased his Lopressor to 100 mg twice daily for better rate control.  He eventually underwent outpatient DC cardioversion on 02/24/2018.  Post cardioversion, his metoprolol was reduced back down to 50 mg twice daily.   He has been in his usual state of health until yesterday morning.  He says he woke up fine, however around 6 AM, he started noticing his heart rate was racing.  He also has shortness of breath as well.  Shortly after, he started having a substernal chest pain which lasted for several hours.  Chest pain went away shortly after he was given a dose of sublingual nitroglycerin.  Initial EKG on arrival showed possible atrial flutter with RVR versus sinus tachycardia.  After his heart rate slows down further, it appears patient is in atrial flutter instead.  Cardiology has been consulted for both chest pain and recurrent atrial flutter.   Past Medical History:  Diagnosis Date  . Atrial fibrillation ([place])   . CAD (coronary artery disease) of artery bypass graft 2000   2000 with multiple bypasses   . Chronic anticoagulation    Coumadin therapy for mechanical aortic valve. Postoperative atrial fibrillation.   . Essential hypertension   . H/O mechanical aortic valve replacement 2000   [employer] AVR  . Hypothyroidism   . Premature ventricular contractions     Past Surgical History:  Procedure Laterality Date  . AORTIC VALVE REPLACEMENT (AVR)/CORONARY ARTERY BYPASS GRAFTING (CABG)  2000  . CARDIOVERSION N/A 02/24/2018   Procedure: CARDIOVERSION;  Surgeon: Dorris Carnes  V, MD;  Location: [place];  Service: Cardiovascular;  Laterality: N/A;  . COLONOSCOPY WITH PROPOFOL N/A 01/08/2018   Procedure: COLONOSCOPY WITH PROPOFOL;  Surgeon: Arta Silence, MD;  Location: [facility];  Service: Gastroenterology;  Laterality: N/A;  . ESOPHAGOGASTRODUODENOSCOPY (EGD) WITH PROPOFOL N/A 01/06/2018   Procedure:  ESOPHAGOGASTRODUODENOSCOPY (EGD) WITH PROPOFOL;  Surgeon: Otis Brace, MD;  Location: [facility];  Service: Gastroenterology;  Laterality: N/A;     Home Medications:  Prior to Admission medications   Medication Sig Start Date End Date Taking? Authorizing Provider  acetaminophen (TYLENOL) 325 MG tablet Take 325 mg by mouth daily as needed for moderate pain or headache.   Yes [provider]  Cholecalciferol (VITAMIN D) 2000 units tablet Take 2,000 Units by mouth daily.   Yes [provider]  COUMADIN 5 MG tablet Take 5-7.5 mg by mouth See admin instructions. Take 5 mg by mouth once daily on Mon, Wed, and Fri, take 7.5 mg once daily on Tues, Thurs, Sat, and Sun 09/18/13  Yes [provider]  diphenhydramine-acetaminophen (TYLENOL PM) 25-500 MG TABS tablet Take 0.5 tablets by mouth at bedtime as needed (sleep).   Yes [provider]  ferrous sulfate 325 (65 FE) MG tablet Take 325 mg by mouth daily with supper.   Yes [provider]  finasteride (PROSCAR) 5 MG tablet Take 5 mg by mouth at bedtime.  10/07/17  Yes [provider]  levothyroxine (SYNTHROID, LEVOTHROID) 75 MCG tablet Take 75 mcg by mouth daily before breakfast.  10/06/13  Yes [provider]  metoprolol tartrate (LOPRESSOR) 50 MG tablet Take 50 mg by mouth 2 (two) times daily. 02/06/18  Yes [provider]  Multiple Vitamins-Minerals (PRESERVISION AREDS 2) CAPS Take 1 capsule by mouth 2 (two) times daily.   Yes [provider]  pantoprazole (PROTONIX) 40 MG tablet Take 1 tablet (40 mg total) by mouth 2 (two) times daily. 01/08/18  Yes Sheikh, Omair Latif, DO  Polyethyl Glycol-Propyl Glycol (SYSTANE OP) Place 1 drop into both eyes daily as needed (dry eyes).   Yes [provider]  rosuvastatin (CRESTOR) 20 MG tablet Take 5 mg by mouth daily.    Yes [provider]  tamsulosin (FLOMAX) 0.4 MG CAPS capsule Take 0.4 mg by mouth at bedtime.    Yes  [provider]  tobramycin (TOBREX) 0.3 % ophthalmic solution Place 1 drop into the left eye See admin instructions. Instill 1 drop into left eye 1 day before, the day of, and the day after eye injections administered at Dr's office   Yes [provider]    Inpatient Medications: Scheduled Meds: . finasteride  5 mg Oral QHS  . levothyroxine  75 mcg Oral QAC breakfast  . metoprolol tartrate  50 mg Oral BID  . pantoprazole  40 mg Oral BID  . rosuvastatin  5 mg Oral Daily  . tamsulosin  0.4 mg Oral QHS  . warfarin  7.5 mg Oral ONCE-1800  . Warfarin - Pharmacist Dosing Inpatient   Does not apply q1800   Continuous Infusions: . sodium chloride 0 mL/hr at 08/12/18 0000  . sodium chloride 75 mL/hr at 08/12/18 0300   PRN Meds: acetaminophen, morphine injection, ondansetron (ZOFRAN) IV  Allergies:    Allergies  Allergen Reactions  . Vicodin [Hydrocodone-Acetaminophen]     Severe sensitivity  . Zocor [Simvastatin]     Short memory loss.  [name] [Olmesartan]     Abdominal cramping and increased stools/ irritable bowel  .  Codeine Nausea And Vomiting  . Lopid [Gemfibrozil] Other (See Comments)    transaminitis  . Monopril [Fosinopril]     transaminitis  . Phenergan [Promethazine Hcl]     Severe somnolence.  . Prilosec [Omeprazole]     dyspepsia    Social History:   Social History   Socioeconomic History  . Marital status: Married    Spouse name: Not on file  . Number of children: Not on file  . Years of education: Not on file  . Highest education level: Not on file  Occupational History  . Not on file  Social Needs  . Financial resource strain: Not on file  . Food insecurity:    Worry: Not on file    Inability: Not on file  . Transportation needs:    Medical: Not on file    Non-medical: Not on file  Tobacco Use  . Smoking status: Former Smoker    Types: Cigarettes  . Smokeless tobacco: Never Used  . Tobacco comment: quit 57 years ago    Substance and Sexual Activity  . Alcohol use: No  . Drug use: No  . Sexual activity: Not on file  Lifestyle  . Physical activity:    Days per week: Not on file    Minutes per session: Not on file  . Stress: Not on file  Relationships  . Social connections:    Talks on phone: Not on file    Gets together: Not on file    Attends religious service: Not on file    Active member of club or organization: Not on file    Attends meetings of clubs or organizations: Not on file    Relationship status: Not on file  . Intimate partner violence:    Fear of current or ex partner: Not on file    Emotionally abused: Not on file    Physically abused: Not on file    Forced sexual activity: Not on file  Other Topics Concern  . Not on file  Social History Narrative  . Not on file    Family History:    Family History  Problem Relation Age of Onset  . Hypertension Mother   . Heart attack Mother   . Hypertension Father      ROS:  Please see the history of present illness.   All other ROS reviewed and negative.     Physical Exam/Data:   Vitals:   08/11/18 2300 08/11/18 2336 08/12/18 0002 08/12/18 0906  BP: 114/80 128/85 (!) 151/68 (!) 122/97  Pulse: 94 (!) 102 86 (!) 116  Resp: 15 (!) 26 18   Temp:   97.6 F (36.4 C)   TempSrc:   Oral Oral  SpO2: 94% 94% 96% 96%  Weight:   83.6 kg   Height:   5\' 10"  (1.778 m)     Intake/Output Summary (Last 24 hours) at 08/12/2018 1149 Last data filed at 08/12/2018 0900 Gross per 24 hour  Intake 193.1 ml  Output 800 ml  Net -606.9 ml   Filed Weights   08/11/18 1602 08/12/18 0002  Weight: 84.8 kg 83.6 kg   Body mass index is 26.44 kg/m.  General:  Well nourished, well developed, in no acute distress HEENT: normal Lymph: no adenopathy Neck: no JVD Endocrine:  No thryomegaly Vascular: No carotid bruits; FA pulses 2+ bilaterally without bruits  Cardiac: Tachycardic.  S1, S2; RRR; no murmur  Lungs:  clear to auscultation bilaterally,  no wheezing, rhonchi or rales  Abd: soft, nontender, no hepatomegaly  Ext: no edema Musculoskeletal:  No deformities, BUE and BLE strength normal and equal Skin: warm and dry  Neuro:  CNs 2-12 intact, no focal abnormalities noted Psych:  Normal affect   EKG:  The EKG was personally reviewed and demonstrates: Atrial flutter with RVR versus sinus tach Telemetry:  Telemetry was personally reviewed and demonstrates: 3-1 atrial flutter.  Relevant CV Studies:  Echo 08/12/2018 LV EF: 55% -   60% Study Conclusions  - Left ventricle: The cavity size was normal. There was mild   concentric hypertrophy. Systolic function was normal. The   estimated ejection fraction was in the range of 55% to 60%. Wall   motion was normal; there were no regional wall motion   abnormalities. - Ventricular septum: Septal motion showed paradox. These changes   are consistent with a post-thoracotomy state. - Aortic valve: A mechanical prosthesis was present and functioning   normally. - Mitral valve: Calcified annulus. There was mild regurgitation. - Left atrium: The atrium was mildly dilated. - Right ventricle: Systolic function was mildly to moderately   reduced. - Right atrium: The atrium was mildly dilated.  Laboratory Data:  Chemistry Recent Labs  Lab 08/11/18 1617  NA 139  K 4.2  CL 103  CO2 26  GLUCOSE 127*  BUN 9  CREATININE 1.20  CALCIUM 9.8  GFRNONAA 55*  GFRAA >60  ANIONGAP 10    No results for input(s): PROT, ALBUMIN, AST, ALT, ALKPHOS, BILITOT in the last 168 hours. Hematology Recent Labs  Lab 08/11/18 1617  WBC 8.3  RBC 5.02  HGB 16.5  HCT 53.4*  MCV 106.4*  MCH 32.9  MCHC 30.9  RDW 12.8  PLT 206   Cardiac Enzymes Recent Labs  Lab 08/11/18 1955 08/12/18 0225 08/12/18 0703  TROPONINI <0.03 <0.03 <0.03    Recent Labs  Lab 08/11/18 1637  TROPIPOC 0.00    BNPNo results for input(s): BNP, PROBNP in the last 168 hours.  DDimer  Recent Labs  Lab 08/11/18 1736   DDIMER <0.27    Radiology/Studies:  Dg Chest 2 View  Result Date: 08/11/2018 CLINICAL DATA:  Chest tightness EXAM: CHEST - 2 VIEW COMPARISON:  02/05/2018 FINDINGS: Post sternotomy changes and valve prosthesis. No acute airspace disease or effusion. Cardiomediastinal silhouette within normal limits. Aortic atherosclerosis. No pneumothorax. IMPRESSION: No active cardiopulmonary disease. Electronically Signed   By: Donavan Foil M.D.   On: 08/11/2018 18:03    Assessment and Plan:   1. Chronic atrial flutter: Previously underwent DC cardioversion in August, however had recurrence of atrial flutter was poorly controlled heart rate.  Heart rate is 90-100 on the current metoprolol tartrate 50 mg twice daily.  Given recurrence of atrial flutter, will initiate the patient on IV amiodarone therapy.  He likely will have recurrent atrial flutter down the road unless he is on antiarrhythmic therapy.   - This patients CHA2DS2-VASc Score and unadjusted Ischemic Stroke Rate (% per year) is equal to 4.8 % stroke rate/year from a score of 4  Above score calculated as 1 point each if present [CHF, HTN, DM, Vascular=MI/PAD/Aortic Plaque, Age if 65-74, or Male] Above score calculated as 2 points each if present [Age > 75, or Stroke/TIA/TE]  - We will tentatively add patient on board for DC cardioversion this Friday unless he converts prior to that  2. Chest pain: Single prolonged chest pain yesterday for several hours with negative troponin x3.  Discussed case with Dr. Tamala Julian, patient is at  high risk for significant ischemia given the fact his grafts are 82 years old.  Will defer to Dr. Tamala Julian regarding timing of cardiac catheterization.  Stop Coumadin, IV heparin per pharmacy.  3. history of CAD s/p CABG in 2000   4. Hypertension  5. Hypothyroidism: Last TSH in June 2019 was borderline elevated.  We will recheck TSH  6. history of mechanical aortic valve replacement: Stable on echocardiogram obtained  today.       For questions or updates, please contact [place] Please consult [email or web address] for contact info under     Hilbert Corrigan, [place]  08/12/2018 11:49 AM

## 2018-08-13 LAB — CBC
HEMATOCRIT: 48.5 % (ref 39.0–52.0)
Hemoglobin: 15.2 g/dL (ref 13.0–17.0)
MCH: 32.5 pg (ref 26.0–34.0)
MCHC: 31.3 g/dL (ref 30.0–36.0)
MCV: 103.9 fL — ABNORMAL HIGH (ref 80.0–100.0)
Platelets: 179 10*3/uL (ref 150–400)
RBC: 4.67 MIL/uL (ref 4.22–5.81)
RDW: 12.8 % (ref 11.5–15.5)
WBC: 6.7 10*3/uL (ref 4.0–10.5)
nRBC: 0 % (ref 0.0–0.2)

## 2018-08-13 LAB — BASIC METABOLIC PANEL
ANION GAP: 7 (ref 5–15)
BUN: 10 mg/dL (ref 8–23)
CO2: 27 mmol/L (ref 22–32)
Calcium: 9.1 mg/dL (ref 8.9–10.3)
Chloride: 104 mmol/L (ref 98–111)
Creatinine, Ser: 1.33 mg/dL — ABNORMAL HIGH (ref 0.61–1.24)
GFR calc Af Amer: 56 mL/min — ABNORMAL LOW (ref >60)
GFR calc non Af Amer: 49 mL/min — ABNORMAL LOW (ref >60)
Glucose, Bld: 145 mg/dL — ABNORMAL HIGH (ref 70–99)
Potassium: 4.2 mmol/L (ref 3.5–5.1)
SODIUM: 138 mmol/L (ref 135–145)

## 2018-08-13 LAB — PROTIME-INR
INR: 2.27
INR: 1.89
PROTHROMBIN TIME: 24.7 s — AB (ref 11.4–15.2)
Prothrombin Time: 21.5 seconds — ABNORMAL HIGH (ref 11.4–15.2)

## 2018-08-13 LAB — HEPARIN LEVEL (UNFRACTIONATED): Heparin Unfractionated: 0.29 IU/mL — ABNORMAL LOW (ref 0.30–0.70)

## 2018-08-13 MED ORDER — PROSIGHT PO TABS
1.0000 | ORAL_TABLET | Freq: Every day | ORAL | Status: DC
  Administered 2018-08-13 – 2018-08-18 (×5): 1 via ORAL
  Filled 2018-08-13 (×6): qty 1

## 2018-08-13 MED ORDER — HEPARIN (PORCINE) 25000 UT/250ML-% IV SOLN
1300.0000 [IU]/h | INTRAVENOUS | Status: DC
  Administered 2018-08-13: 1200 [IU]/h via INTRAVENOUS
  Administered 2018-08-14: 1300 [IU]/h via INTRAVENOUS
  Filled 2018-08-13 (×2): qty 250

## 2018-08-13 NOTE — Progress Notes (Signed)
1        Progress Note  Patient Name: Jason Santana Date of Encounter: 08/13/2018  Primary Cardiologist: Sinclair Grooms, MD   Subjective   The patient is asymptomatic.  No recurrence of angina.  He denies dyspnea.  He is able to lie flat.  Inpatient Medications    Scheduled Meds: . finasteride  5 mg Oral QHS  . levothyroxine  75 mcg Oral QAC breakfast  . metoprolol tartrate  50 mg Oral BID  . multivitamin  1 tablet Oral Daily  . pantoprazole  40 mg Oral BID  . rosuvastatin  5 mg Oral Daily  . tamsulosin  0.4 mg Oral QHS   Continuous Infusions: . amiodarone 30 mg/hr (08/13/18 1238)   PRN Meds: acetaminophen, morphine injection, ondansetron (ZOFRAN) IV   Vital Signs    Vitals:   08/12/18 1924 08/12/18 2300 08/13/18 0739 08/13/18 1133  BP: 134/83 114/73 123/65 132/73  Pulse: 90 73 90 82  Resp: (!) 21 16  18   Temp: 98.1 F (36.7 C) (!) 97.5 F (36.4 C)  97.7 F (36.5 C)  TempSrc: Oral Oral  Oral  SpO2: 95% 93% 97% 96%  Weight:      Height:        Intake/Output Summary (Last 24 hours) at 08/13/2018 1322 Last data filed at 08/13/2018 0800 Gross per 24 hour  Intake 658.55 ml  Output 650 ml  Net 8.55 ml   Filed Weights   08/11/18 1602 08/12/18 0002  Weight: 84.8 kg 83.6 kg    Telemetry    A flutter with variable AV conduction.- Personally Reviewed  ECG    Atrial flutter with variable AV conduction.  No acute ST-T wave changes. - Personally Reviewed  Physical Exam  Comfortable and compensated lying flat GEN: No acute distress.   Neck: No JVD Cardiac: IIRR, no rubs, or gallops.  Mechanical aortic valve closure sounds..  2/6 right upper sternal systolic murmur.  No diastolic murmur. Respiratory: Clear to auscultation bilaterally. GI: Soft, nontender, non-distended  MS: No edema; No deformity. Neuro:  Nonfocal  Psych: Normal affect   Labs    Chemistry Recent Labs  Lab 08/11/18 1617 08/13/18 0328  NA 139 138  K 4.2 4.2  CL 103 104  CO2 26  27  GLUCOSE 127* 145*  BUN 9 10  CREATININE 1.20 1.33*  CALCIUM 9.8 9.1  GFRNONAA 55* 49*  GFRAA >60 56*  ANIONGAP 10 7     Hematology Recent Labs  Lab 08/11/18 1617 08/13/18 0328  WBC 8.3 6.7  RBC 5.02 4.67  HGB 16.5 15.2  HCT 53.4* 48.5  MCV 106.4* 103.9*  MCH 32.9 32.5  MCHC 30.9 31.3  RDW 12.8 12.8  PLT 206 179    Cardiac Enzymes Recent Labs  Lab 08/11/18 1955 08/12/18 0225 08/12/18 0703  TROPONINI <0.03 <0.03 <0.03    Recent Labs  Lab 08/11/18 1637  TROPIPOC 0.00     BNPNo results for input(s): BNP, PROBNP in the last 168 hours.   DDimer  Recent Labs  Lab 08/11/18 1736  DDIMER <0.27    Radiology    Dg Chest 2 View  Result Date: 08/11/2018 CLINICAL DATA:  Chest tightness EXAM: CHEST - 2 VIEW COMPARISON:  02/05/2018 FINDINGS: Post sternotomy changes and valve prosthesis. No acute airspace disease or effusion. Cardiomediastinal silhouette within normal limits. Aortic atherosclerosis. No pneumothorax. IMPRESSION: No active cardiopulmonary disease. Electronically Signed   By: Donavan Foil M.D.   On: 08/11/2018 18:03  Cardiac Studies   Markers are negative x3.  2D Doppler echocardiogram 08/12/2018: Study Conclusions  - Left ventricle: The cavity size was normal. There was mild   concentric hypertrophy. Systolic function was normal. The   estimated ejection fraction was in the range of 55% to 60%. Wall   motion was normal; there were no regional wall motion   abnormalities. - Ventricular septum: Septal motion showed paradox. These changes   are consistent with a post-thoracotomy state. - Aortic valve: A mechanical prosthesis was present and functioning   normally. - Mitral valve: Calcified annulus. There was mild regurgitation. - Left atrium: The atrium was mildly dilated. - Right ventricle: Systolic function was mildly to moderately   reduced. - Right atrium: The atrium was mildly dilated.   Patient Profile     84 y.o. male a hx of  hypertension, hypothyroidism, paroxysmal atrial flutter on Coumadin, history of mechanical aortic valve replacement requiring Coumadin, and history of CAD s/p CABG in 2000 who presented with chest pain and weakness and was found to be in recurrent atrial flutter with poor rate control.  Assessment & Plan    1. Atrial flutter/fibrillation - initiated amiodarone therapy and hoping for pharmacologic conversion.  May require repeat cardioversion.  Plan is to continue amiodarone orally after cardioversion. 2. Mechanical aortic valve -requiring chronic anticoagulation therapy.  Plan to hold Coumadin and start IV heparin when INR 2.2 or less. 3. Angina pectoris in the setting of prior coronary bypass grafting 2000.  Suspect bypass graft failure.  Plan cath tomorrow afternoon.  Will check INR in a.m. 4. Anticoagulation with INR 2.27 today.  As noted, will plan to start IV heparin when INR drops below 2.2 5. Essential hypertension -very well controlled. 6. Amiodarone therapy - IV amiodarone to be switched to oral after cardioversion.  We will tentatively plan cardioversion for Friday. 7. CKD stage III -check creatinine in a.m.  The patient was counseled to undergo left heart catheterization, coronary angiography, and possible percutaneous coronary intervention with stent implantation. The procedural risks and benefits were discussed in detail. The risks discussed included death, stroke, myocardial infarction, life-threatening bleeding, limb ischemia, kidney injury, allergy, and possible emergency cardiac surgery. The risk of these significant complications were estimated to occur less than 1% of the time. After discussion, the patient has agreed to proceed.  Plan cardioversion Friday, [place] write orders in AM.       For questions or updates, please contact [place] Please consult [email or web address] for contact info under Cardiology/STEMI.      Signed, Sinclair Grooms, MD  08/13/2018, 1:22 PM

## 2018-08-13 NOTE — Progress Notes (Signed)
PROGRESS NOTE    Jason Santana  ENI:[id number] DOB: 08/07/1932 DOA: 08/11/2018 PCP: [name], MD   Brief Narrative:  84 year old with past medical history of mitral valve replacement on Coumadin, essential hypertension, atrial fibrillation, CAD status post CABG, hypothyroidism came to the hospital with complaints of chest pain.  Patient states he mostly get this chest pain during exertion.  He was also noted to be in atrial fibrillation with RVR.  Cardiology has been following who recommends left heart catheterization and possible cardioversion.   Assessment & Plan:   Active Problems:   Paroxysmal atrial flutter (HCC)   Essential hypertension   H/O mechanical aortic valve replacement   CAD (coronary artery disease) of artery bypass graft   Chronic anticoagulation   Hypothyroidism   Atypical atrial flutter (HCC)   Chest pain   Atypical chest pain -This is concerning given his previous history.  Cardiac enzymes remain negative at this time.  2D echo showed preserved ejection fraction without any obvious wall motion abnormality. -Cardiology recommending left heart catheterization once INR is drifted down below 1.7.  Hopefully we can get this done tomorrow. -Currently he is chest pain-free  Atrial fibrillation/flutter with RVR, improved -Metoprolol 50 mg twice daily.  On anticoagulation-Heparin drip -Currently on amiodarone infusion  Coronary artery disease status post bypass -Currently chest pain-free.  Cardiology is following  Essential hypertension -Continue metoprolol.  Hypothyroidism -continue Synthroid  History of mechanical valve replacement -Currently on Coumadin.  GERD -Continue Protonix 40 mg daily  BPH -Continue Flomax and finasteride   DVT prophylaxis: Heparin drip Code Status:  partial code Family Communication: Wife at bedside Disposition Plan: Maintain inpatient stay until we have better control of the heart rate and patient has underwent coronary  artery evaluation.  Consultants:   Cardiology  Procedures:   None so far  Antimicrobials:   None   Subjective: Patient demands to be in regular diet this morning and history of heart healthy.  Currently chest pain-free but he does state he is afraid to go home and carry on his routine activity as he thinks he is going to get his chest pain back again.  Review of Systems Otherwise negative except as per HPI, including: General: Denies fever, chills, night sweats or unintended weight loss. Resp: Denies cough, wheezing, shortness of breath. Cardiac: Denies chest pain, palpitations, orthopnea, paroxysmal nocturnal dyspnea. GI: Denies abdominal pain, nausea, vomiting, diarrhea or constipation GU: Denies dysuria, frequency, hesitancy or incontinence MS: Denies muscle aches, joint pain or swelling Neuro: Denies headache, neurologic deficits (focal weakness, numbness, tingling), abnormal gait Psych: Denies anxiety, depression, SI/HI/AVH Skin: Denies new rashes or lesions ID: Denies sick contacts, exotic exposures, travel  Objective: Vitals:   08/12/18 1924 08/12/18 2300 08/13/18 0739 08/13/18 1133  BP: 134/83 114/73 123/65 132/73  Pulse: 90 73 90 82  Resp: (!) 21 16  18   Temp: 98.1 F (36.7 C) (!) 97.5 F (36.4 C)  97.7 F (36.5 C)  TempSrc: Oral Oral  Oral  SpO2: 95% 93% 97% 96%  Weight:      Height:        Intake/Output Summary (Last 24 hours) at 08/13/2018 1350 Last data filed at 08/13/2018 0800 Gross per 24 hour  Intake 658.55 ml  Output 650 ml  Net 8.55 ml   Filed Weights   08/11/18 1602 08/12/18 0002  Weight: 84.8 kg 83.6 kg    Examination:  General exam: Appears calm and comfortable  Respiratory system: Clear to auscultation. Respiratory effort normal. Cardiovascular  system: S1 & S2 heard, RRR. No JVD, murmurs, rubs, gallops or clicks. No pedal edema. Gastrointestinal system: Abdomen is nondistended, soft and nontender. No organomegaly or masses felt.  Normal bowel sounds heard. Central nervous system: Alert and oriented. No focal neurological deficits. Extremities: Symmetric 5 x 5 power. Skin: No rashes, lesions or ulcers Psychiatry: Judgement and insight appear normal. Mood & affect appropriate.     Data Reviewed:   CBC: Recent Labs  Lab 08/11/18 1617 08/13/18 0328  WBC 8.3 6.7  HGB 16.5 15.2  HCT 53.4* 48.5  MCV 106.4* 103.9*  PLT 206 161   Basic Metabolic Panel: Recent Labs  Lab 08/11/18 1617 08/13/18 0328  NA 139 138  K 4.2 4.2  CL 103 104  CO2 26 27  GLUCOSE 127* 145*  BUN 9 10  CREATININE 1.20 1.33*  CALCIUM 9.8 9.1   GFR: Estimated Creatinine Clearance: 42.7 mL/min (A) (by C-G formula based on SCr of 1.33 mg/dL (H)). Liver Function Tests: No results for input(s): AST, ALT, ALKPHOS, BILITOT, PROT, ALBUMIN in the last 168 hours. No results for input(s): LIPASE, AMYLASE in the last 168 hours. No results for input(s): AMMONIA in the last 168 hours. Coagulation Profile: Recent Labs  Lab 08/11/18 1955 08/12/18 0703 08/13/18 0328 08/13/18 1253  INR 2.65 2.67 2.27 1.89   Cardiac Enzymes: Recent Labs  Lab 08/11/18 1955 08/12/18 0225 08/12/18 0703  TROPONINI <0.03 <0.03 <0.03   BNP (last 3 results) No results for input(s): PROBNP in the last 8760 hours. HbA1C: Recent Labs    08/12/18 0225  HGBA1C 5.7*   CBG: No results for input(s): GLUCAP in the last 168 hours. Lipid Profile: Recent Labs    08/12/18 0225  CHOL 199  HDL 26*  LDLCALC 125*  TRIG 241*  CHOLHDL 7.7   Thyroid Function Tests: Recent Labs    08/12/18 1439  TSH 4.958*   Anemia Panel: No results for input(s): VITAMINB12, FOLATE, FERRITIN, TIBC, IRON, RETICCTPCT in the last 72 hours. Sepsis Labs: No results for input(s): PROCALCITON, LATICACIDVEN in the last 168 hours.  Recent Results (from the past 240 hour(s))  MRSA PCR Screening     Status: None   Collection Time: 08/12/18 12:59 AM  Result Value Ref Range Status    MRSA by PCR NEGATIVE NEGATIVE Final    Comment:        The GeneXpert MRSA Assay (FDA approved for NASAL specimens only), is one component of a comprehensive MRSA colonization surveillance program. It is not intended to diagnose MRSA infection nor to guide or monitor treatment for MRSA infections. Performed at [facility], [place] [address] [place], [place] [address]          Radiology Studies: Dg Chest 2 View  Result Date: 08/11/2018 CLINICAL DATA:  Chest tightness EXAM: CHEST - 2 VIEW COMPARISON:  02/05/2018 FINDINGS: Post sternotomy changes and valve prosthesis. No acute airspace disease or effusion. Cardiomediastinal silhouette within normal limits. Aortic atherosclerosis. No pneumothorax. IMPRESSION: No active cardiopulmonary disease. Electronically Signed   By: Donavan Foil M.D.   On: 08/11/2018 18:03        Scheduled Meds: . finasteride  5 mg Oral QHS  . levothyroxine  75 mcg Oral QAC breakfast  . metoprolol tartrate  50 mg Oral BID  . multivitamin  1 tablet Oral Daily  . pantoprazole  40 mg Oral BID  . rosuvastatin  5 mg Oral Daily  . tamsulosin  0.4 mg Oral QHS   Continuous Infusions: .  amiodarone 30 mg/hr (08/13/18 1238)  . heparin       LOS: 1 day   Time spent= 25 mins    Ankit Arsenio Loader, MD [facility] Pager [phone]   If 7PM-7AM, please contact night-coverage [email or web address] Password [email or web address] 08/13/2018, 1:50 PM

## 2018-08-13 NOTE — Progress Notes (Signed)
[facility] [place] for heparin (hold warfarin) Indication: Atrial Fibrillation + mechanical AVR  Patient Measurements: Height: 5\' 10"  (177.8 cm) Weight: 184 lb 4.9 oz (83.6 kg) IBW/kg (Calculated) : 73   Vital Signs: Temp: 98 F (36.7 C) (12/11 2328) Temp Source: Oral (12/11 2328) BP: 115/54 (12/11 2331) Pulse Rate: 77 (12/11 2328)  Labs: Recent Labs    08/11/18 1617  08/11/18 1955 08/12/18 0225 08/12/18 0703 08/13/18 0328 08/13/18 1253 08/13/18 2235  HGB 16.5  --   --   --   --  15.2  --   --   HCT 53.4*  --   --   --   --  48.5  --   --   PLT 206  --   --   --   --  179  --   --   LABPROT  --    < > 27.9*  --  28.0* 24.7* 21.5*  --   INR  --    < > 2.65  --  2.67 2.27 1.89  --   HEPARINUNFRC  --   --   --   --   --   --   --  0.29*  CREATININE 1.20  --   --   --   --  1.33*  --   --   TROPONINI  --   --  <0.03 <0.03 <0.03  --   --   --    < > = values in this interval not displayed.     Medical History: Past Medical History:  Diagnosis Date  . Atrial fibrillation ([place])   . CAD (coronary artery disease) of artery bypass graft 2000   2000 with multiple bypasses   . Chronic anticoagulation    Coumadin therapy for mechanical aortic valve. Postoperative atrial fibrillation.   . Essential hypertension   . H/O mechanical aortic valve replacement 2000   [employer] Jude AVR  . Hypothyroidism   . Premature ventricular contractions     Assessment: 84 yo male with AFib and mechanical AVR (INR goal 2.5-3.0) admitted with recurrent AFib RVR and CP. Pharmacy consulted to hold warfarin and start IV heparin for cardiac cath. Discussed with cardiology - will initiate IV heparin once INR < 2.20 (given higher INR goal and possible need for DCCV this admit). Heparin level 0.29 units/ml   Goal of Therapy:  Heparin level 0.3-0.7 units/ml Monitor platelets by anticoagulation protocol: Yes    Plan:  -Heparin 1300 units/hr -Check heparin level in 6-8  hours  Excell Seltzer, PharmD Clinical Pharmacist 08/13/2018

## 2018-08-13 NOTE — Progress Notes (Addendum)
[place] [place] for heparin (hold warfarin) Indication: Atrial Fibrillation + mechanical AVR  Patient Measurements: Height: 5\' 10"  (177.8 cm) Weight: 184 lb 4.9 oz (83.6 kg) IBW/kg (Calculated) : 73   Vital Signs: BP: 123/65 (12/11 0739) Pulse Rate: 90 (12/11 0739)  Labs: Recent Labs    08/11/18 1617 08/11/18 1955 08/12/18 0225 08/12/18 0703 08/13/18 0328  HGB 16.5  --   --   --  15.2  HCT 53.4*  --   --   --  48.5  PLT 206  --   --   --  179  LABPROT  --  27.9*  --  28.0* 24.7*  INR  --  2.65  --  2.67 2.27  CREATININE 1.20  --   --   --  1.33*  TROPONINI  --  <0.03 <0.03 <0.03  --      Medical History: Past Medical History:  Diagnosis Date  . Atrial fibrillation ([place])   . CAD (coronary artery disease) of artery bypass graft 2000   2000 with multiple bypasses   . Chronic anticoagulation    Coumadin therapy for mechanical aortic valve. Postoperative atrial fibrillation.   . Essential hypertension   . H/O mechanical aortic valve replacement 2000   [employer] AVR  . Hypothyroidism   . Premature ventricular contractions     Assessment: 84 yo male with AFib and mechanical AVR (INR goal 2.5-3.0) admitted with recurrent AFib RVR and CP. Pharmacy consulted to hold warfarin and start IV heparin for cardiac cath. Discussed with cardiology - will initiate IV heparin once INR < 2.20 (given higher INR goal and possible need for DCCV this admit).  INR 2.27 this morning, CBC stable. Will recheck INR this afternoon.  PTA warfarin: 5 mg on Mon/Wed/Fri, 7.5 mg on all other days  Goal of Therapy:  INR 2-3 Monitor platelets by anticoagulation protocol: Yes    Plan:  -Continue to hold warfarin -Check INR at 1330 - start IV heparin if < 2.2   ADDENDUM: Repeat INR this afternoon is 1.89, will begin heparin infusion to cover.  Plan -Heparin 1200 units/hr -Check 8hr heparin level tonight  Arrie Senate, PharmD, BCPS Clinical  Pharmacist [phone] Please check AMION for all [place] numbers 08/13/2018

## 2018-08-14 ENCOUNTER — Encounter (HOSPITAL_COMMUNITY)
Admission: EM | Disposition: A | Discharge status: Home / Self Care | Payer: Self-pay | Source: Home / Self Care | Attending: Internal Medicine

## 2018-08-14 DIAGNOSIS — I25110 Atherosclerotic heart disease of native coronary artery with unstable angina pectoris: Secondary | ICD-10-CM

## 2018-08-14 DIAGNOSIS — E039 Hypothyroidism, unspecified: Secondary | ICD-10-CM

## 2018-08-14 DIAGNOSIS — I10 Essential (primary) hypertension: Secondary | ICD-10-CM

## 2018-08-14 HISTORY — PX: CORONARY/GRAFT ANGIOGRAPHY: CATH118237

## 2018-08-14 LAB — BASIC METABOLIC PANEL
Anion gap: 12 (ref 5–15)
BUN: 13 mg/dL (ref 8–23)
CHLORIDE: 103 mmol/L (ref 98–111)
CO2: 22 mmol/L (ref 22–32)
Calcium: 8.9 mg/dL (ref 8.9–10.3)
Creatinine, Ser: 1.44 mg/dL — ABNORMAL HIGH (ref 0.61–1.24)
GFR calc Af Amer: 51 mL/min — ABNORMAL LOW (ref >60)
GFR calc non Af Amer: 44 mL/min — ABNORMAL LOW (ref >60)
Glucose, Bld: 139 mg/dL — ABNORMAL HIGH (ref 70–99)
Potassium: 4.8 mmol/L (ref 3.5–5.1)
Sodium: 137 mmol/L (ref 135–145)

## 2018-08-14 LAB — CBC
HCT: 47.3 % (ref 39.0–52.0)
Hemoglobin: 14.9 g/dL (ref 13.0–17.0)
MCH: 32.7 pg (ref 26.0–34.0)
MCHC: 31.5 g/dL (ref 30.0–36.0)
MCV: 103.7 fL — AB (ref 80.0–100.0)
Platelets: 173 10*3/uL (ref 150–400)
RBC: 4.56 MIL/uL (ref 4.22–5.81)
RDW: 12.6 % (ref 11.5–15.5)
WBC: 6.7 10*3/uL (ref 4.0–10.5)
nRBC: 0 % (ref 0.0–0.2)

## 2018-08-14 LAB — TROPONIN I
Troponin I: <0.03 ng/mL (ref <0.03)
Troponin I: <0.03 ng/mL (ref <0.03)

## 2018-08-14 LAB — PROTIME-INR
INR: 1.69
Prothrombin Time: 19.7 seconds — ABNORMAL HIGH (ref 11.4–15.2)

## 2018-08-14 LAB — HEPARIN LEVEL (UNFRACTIONATED): Heparin Unfractionated: 0.5 IU/mL (ref 0.30–0.70)

## 2018-08-14 SURGERY — CORONARY/GRAFT ANGIOGRAPHY
Anesthesia: LOCAL

## 2018-08-14 MED ORDER — MIDAZOLAM HCL 2 MG/2ML IJ SOLN
INTRAMUSCULAR | Status: DC | PRN
  Administered 2018-08-14: 1 mg via INTRAVENOUS

## 2018-08-14 MED ORDER — VERAPAMIL HCL 2.5 MG/ML IV SOLN
INTRAVENOUS | Status: DC | PRN
  Administered 2018-08-14: 10 mL via INTRA_ARTERIAL

## 2018-08-14 MED ORDER — SODIUM CHLORIDE 0.9% FLUSH
3.0000 mL | Freq: Two times a day (BID) | INTRAVENOUS | Status: DC
  Administered 2018-08-15 – 2018-08-17 (×4): 3 mL via INTRAVENOUS

## 2018-08-14 MED ORDER — SODIUM CHLORIDE 0.9 % WEIGHT BASED INFUSION: 1.0000 mL/kg/h | INTRAVENOUS | Status: AC

## 2018-08-14 MED ORDER — ASPIRIN 81 MG PO CHEW: 81.0000 mg | CHEWABLE_TABLET | ORAL | Status: DC

## 2018-08-14 MED ORDER — SODIUM CHLORIDE 0.9 % WEIGHT BASED INFUSION
1.0000 mL/kg/h | INTRAVENOUS | Status: DC
  Administered 2018-08-14 (×2): 1 mL/kg/h via INTRAVENOUS

## 2018-08-14 MED ORDER — HEPARIN (PORCINE) IN NACL 1000-0.9 UT/500ML-% IV SOLN
INTRAVENOUS | Status: DC | PRN
  Administered 2018-08-14 (×2): 500 mL

## 2018-08-14 MED ORDER — SODIUM CHLORIDE 0.9 % WEIGHT BASED INFUSION: 3.0000 mL/kg/h | INTRAVENOUS | Status: DC

## 2018-08-14 MED ORDER — HEPARIN SODIUM (PORCINE) 1000 UNIT/ML IJ SOLN
INTRAMUSCULAR | Status: AC
  Filled 2018-08-14: qty 1

## 2018-08-14 MED ORDER — SODIUM CHLORIDE 0.9 % IV SOLN
INTRAVENOUS | Status: AC | PRN
  Administered 2018-08-14: 10 mL/h via INTRAVENOUS

## 2018-08-14 MED ORDER — VERAPAMIL HCL 2.5 MG/ML IV SOLN
INTRAVENOUS | Status: AC
  Filled 2018-08-14: qty 2

## 2018-08-14 MED ORDER — IOHEXOL 350 MG/ML SOLN
INTRAVENOUS | Status: DC | PRN
  Administered 2018-08-14: 90 mL via INTRA_ARTERIAL

## 2018-08-14 MED ORDER — LIDOCAINE HCL (PF) 1 % IJ SOLN
INTRAMUSCULAR | Status: DC | PRN
  Administered 2018-08-14: 2 mL via SUBCUTANEOUS

## 2018-08-14 MED ORDER — SODIUM CHLORIDE 0.9 % IV SOLN: 250.0000 mL | INTRAVENOUS | Status: DC | PRN

## 2018-08-14 MED ORDER — SODIUM CHLORIDE 0.9% FLUSH
3.0000 mL | Freq: Two times a day (BID) | INTRAVENOUS | Status: DC
  Administered 2018-08-14: 3 mL via INTRAVENOUS

## 2018-08-14 MED ORDER — WARFARIN SODIUM 10 MG PO TABS: 10.0000 mg | ORAL_TABLET | Freq: Once | ORAL | Status: DC

## 2018-08-14 MED ORDER — MIDAZOLAM HCL 2 MG/2ML IJ SOLN
INTRAMUSCULAR | Status: AC
  Filled 2018-08-14: qty 2

## 2018-08-14 MED ORDER — WARFARIN - PHARMACIST DOSING INPATIENT: Freq: Every day | Status: DC

## 2018-08-14 MED ORDER — FENTANYL CITRATE (PF) 100 MCG/2ML IJ SOLN
INTRAMUSCULAR | Status: AC
  Filled 2018-08-14: qty 2

## 2018-08-14 MED ORDER — SODIUM CHLORIDE 0.9 % WEIGHT BASED INFUSION: 1.0000 mL/kg/h | INTRAVENOUS | Status: DC

## 2018-08-14 MED ORDER — HEPARIN SODIUM (PORCINE) 1000 UNIT/ML IJ SOLN
INTRAMUSCULAR | Status: DC | PRN
  Administered 2018-08-14: 4500 [IU] via INTRAVENOUS

## 2018-08-14 MED ORDER — HEPARIN (PORCINE) 25000 UT/250ML-% IV SOLN
1000.0000 [IU]/h | INTRAVENOUS | Status: DC
  Administered 2018-08-15: 1250 [IU]/h via INTRAVENOUS
  Administered 2018-08-15: 1300 [IU]/h via INTRAVENOUS
  Administered 2018-08-16: 1000 [IU]/h via INTRAVENOUS
  Filled 2018-08-14 (×3): qty 250

## 2018-08-14 MED ORDER — SODIUM CHLORIDE 0.9% FLUSH
3.0000 mL | Freq: Two times a day (BID) | INTRAVENOUS | Status: DC
  Administered 2018-08-14 – 2018-08-17 (×4): 3 mL via INTRAVENOUS

## 2018-08-14 MED ORDER — HEPARIN (PORCINE) IN NACL 1000-0.9 UT/500ML-% IV SOLN
INTRAVENOUS | Status: AC
  Filled 2018-08-14: qty 1000

## 2018-08-14 MED ORDER — LIDOCAINE HCL (PF) 1 % IJ SOLN
INTRAMUSCULAR | Status: AC
  Filled 2018-08-14: qty 30

## 2018-08-14 MED ORDER — SODIUM CHLORIDE 0.9 % IV SOLN
250.0000 mL | INTRAVENOUS | Status: DC
  Administered 2018-08-15: 10:00:00 via INTRAVENOUS

## 2018-08-14 MED ORDER — ASPIRIN 81 MG PO CHEW
81.0000 mg | CHEWABLE_TABLET | ORAL | Status: AC
  Administered 2018-08-14: 81 mg via ORAL
  Filled 2018-08-14: qty 1

## 2018-08-14 MED ORDER — FENTANYL CITRATE (PF) 100 MCG/2ML IJ SOLN
INTRAMUSCULAR | Status: DC | PRN
  Administered 2018-08-14: 25 ug via INTRAVENOUS

## 2018-08-14 MED ORDER — SODIUM CHLORIDE 0.9 % WEIGHT BASED INFUSION
3.0000 mL/kg/h | INTRAVENOUS | Status: AC
  Administered 2018-08-14: 3 mL/kg/h via INTRAVENOUS

## 2018-08-14 MED ORDER — SODIUM CHLORIDE 0.9% FLUSH: 3.0000 mL | INTRAVENOUS | Status: DC | PRN

## 2018-08-14 SURGICAL SUPPLY — 13 items
CATH 5FR JL3.5 JR4 ANG PIG MP (CATHETERS) ×1 IMPLANT
CATH EXPO 5F MPA-1 (CATHETERS) ×1 IMPLANT
CATH INFINITI 5 FR IM (CATHETERS) ×1 IMPLANT
CATH INFINITI 5FR AL1 (CATHETERS) ×1 IMPLANT
DEVICE RAD COMP TR BAND LRG (VASCULAR PRODUCTS) ×1 IMPLANT
ELECT DEFIB PAD ADLT CADENCE (PAD) ×1 IMPLANT
GLIDESHEATH SLEND SS 6F .021 (SHEATH) ×1 IMPLANT
GUIDEWIRE INQWIRE 1.5J.035X260 (WIRE) IMPLANT
INQWIRE 1.5J .035X260CM (WIRE) ×2
KIT HEART LEFT (KITS) ×2 IMPLANT
PACK CARDIAC CATHETERIZATION (CUSTOM PROCEDURE TRAY) ×2 IMPLANT
TRANSDUCER W/STOPCOCK (MISCELLANEOUS) ×2 IMPLANT
TUBING CIL FLEX 10 FLL-RA (TUBING) ×2 IMPLANT

## 2018-08-14 NOTE — Interval H&P Note (Signed)
History and Physical Interval Note:  08/14/2018 4:08 PM  Jason Santana  has presented today for surgery, with the diagnosis of angina - possible bypass graft failure  The various methods of treatment have been discussed with the patient and family. After consideration of risks, benefits and other options for treatment, the patient has consented to  Procedure(s): LEFT HEART CATH AND CORS/GRAFTS ANGIOGRAPHY (N/A) as a surgical intervention .  The patient's history has been reviewed, patient examined, no change in status, stable for surgery.  I have reviewed the patient's chart and labs.  Questions were answered to the patient's satisfaction.     [name]

## 2018-08-14 NOTE — Progress Notes (Signed)
[place] for heparin (hold warfarin) Indication: Atrial Fibrillation + mechanical AVR  Patient Measurements: Height: 5\' 10"  (177.8 cm) Weight: 184 lb 4.9 oz (83.6 kg) IBW/kg (Calculated) : 73   Vital Signs: Temp: 97.3 F (36.3 C) (12/12 0804) Temp Source: Oral (12/12 0804) BP: 131/77 (12/12 0804) Pulse Rate: 78 (12/12 0804)  Labs: Recent Labs    08/11/18 1617  08/12/18 0703 08/13/18 0328 08/13/18 1253 08/13/18 2235 08/14/18 0358 08/14/18 0622  HGB 16.5  --   --  15.2  --   --  14.9  --   HCT 53.4*  --   --  48.5  --   --  47.3  --   PLT 206  --   --  179  --   --  173  --   LABPROT  --    < > 28.0* 24.7* 21.5*  --  19.7*  --   INR  --    < > 2.67 2.27 1.89  --  1.69  --   HEPARINUNFRC  --   --   --   --   --  0.29*  --  0.50  CREATININE 1.20  --   --  1.33*  --   --  1.44*  --   TROPONINI  --    < > <0.03  --   --   --  <0.03 <0.03   < > = values in this interval not displayed.     Medical History: Past Medical History:  Diagnosis Date  . Atrial fibrillation ([place])   . CAD (coronary artery disease) of artery bypass graft 2000   2000 with multiple bypasses   . Chronic anticoagulation    Coumadin therapy for mechanical aortic valve. Postoperative atrial fibrillation.   . Essential hypertension   . H/O mechanical aortic valve replacement 2000   [employer] Jude AVR  . Hypothyroidism   . Premature ventricular contractions     Assessment: 84 yo male with AFib and mechanical AVR (INR goal 2.5-3.0) admitted with recurrent AFib RVR and CP. Pharmacy consulted to hold warfarin and start IV heparin for cardiac cath. Discussed with cardiology - will initiate IV heparin once INR < 2.20 (given higher INR goal and possible need for DCCV this admit).  Heparin level remains therapeutic at 0.5, on 1300 units/hr. Hgb 14.9, plt 173. No s/sx of bleeding. No infusion issues.   Goal of Therapy:  Heparin level 0.3-0.7 units/ml Monitor platelets by  anticoagulation protocol: Yes   Plan:  -Continue heparin gtt at 1300 units/hr -Monitor daily HL, CBC, s/sx of bleeding  Antonietta Jewel, PharmD, [place] Clinical Pharmacist  Pager: [phone] Phone: [phone] 08/14/2018

## 2018-08-14 NOTE — Consult Note (Signed)
[facility] CM Primary Care Navigator  08/14/2018  Jason Santana [birth year] [id number]   Met withpatientat the bedside to identify possible discharge needs.  Patient reports having "chest pains, fast and irregular heart beats" that had led to this admission. (paroxysmal atrial flutter, essential HTN, coronary artery disease, for cardiac catheterization and cardioversion)  Patient endorsesDr.Robert Inda Merlin with [facility] care provider.   Patient shared [place] to obtain medications without any problem.   Patientstatesmanaging his ownmedications at [employer] use of "pill box" system filledweekly.  Patient reports that he has been drivingprior to admission buthis wife Jason Santana) will be able to provide transportationto hisdoctors' appointments after discharge.  Patient lives with wife at home who will serve as his primary caregiver as needed.  Anticipated dischargedispositionstill to be determined pending procedures (cardiac cath and cardioversion), but patient hopes to be discharged home.  Patientvoiced understanding to call primary care provider's office for a post discharge follow-up appointment within a1- 2 weeks orsooner if needs arise. Patient letter (with PCP's contact number) wasprovided as a reminder.  Discussed with patient regarding [facility] CM services available for health managementandresourcesat homebutindicated not needing any services at this time. He reports being aware of ways in managing his health conditions such as diet restrictions, taking medications, staying active, following-up with providers when needed. He plans to obtain blood pressure cuff in order to monitor self regularly.            .  Patient verbalizedunderstandingof needto seekreferral from primary care provider to [facility] care management ifdeemed necessary and appropriatefor anyservicesin the  nearfuture.  [facility] care management information was provided for futureneeds that patient may have.  Patienthowever,verbally agreed and optedforEMMIcalls tofollow-up with his recovery at home.   Referral made for [facility] General calls after discharge.   For additional questions please contact:  Edwena Felty A. Osiris Odriscoll, BSN, RN-BC [facility] PRIMARY CARE Navigator Cell: [phone]

## 2018-08-14 NOTE — Progress Notes (Signed)
1        Progress Note  Patient Name: Jason Santana Date of Encounter: 08/14/2018  Primary Cardiologist: Belva Crome III, MD   Subjective   No chest pain overnight.  Denies dyspnea.  No issues with bleeding.  Had breakfast this morning.  Scheduled for 4 PM.  May be able to go earlier.  Inpatient Medications    Scheduled Meds: . finasteride  5 mg Oral QHS  . levothyroxine  75 mcg Oral QAC breakfast  . metoprolol tartrate  50 mg Oral BID  . multivitamin  1 tablet Oral Daily  . pantoprazole  40 mg Oral BID  . rosuvastatin  5 mg Oral Daily  . sodium chloride flush  3 mL Intravenous Q12H  . tamsulosin  0.4 mg Oral QHS   Continuous Infusions: . sodium chloride    . sodium chloride 1 mL/kg/hr (08/14/18 0520)  . amiodarone 30 mg/hr (08/14/18 0048)  . heparin 1,300 Units/hr (08/14/18 0855)   PRN Meds: sodium chloride, acetaminophen, morphine injection, ondansetron (ZOFRAN) IV, sodium chloride flush   Vital Signs    Vitals:   08/13/18 2331 08/14/18 0300 08/14/18 0804 08/14/18 0902  BP: (!) 115/54 119/64 131/77 126/70  Pulse:   78 83  Resp:  19 (!) 22   Temp:  98.1 F (36.7 C) (!) 97.3 F (36.3 C)   TempSrc:  Oral Oral   SpO2:   97%   Weight:  83.6 kg    Height:        Intake/Output Summary (Last 24 hours) at 08/14/2018 0924 Last data filed at 08/14/2018 0920 Gross per 24 hour  Intake 209.39 ml  Output -  Net 209.39 ml   Filed Weights   08/11/18 1602 08/12/18 0002 08/14/18 0300  Weight: 84.8 kg 83.6 kg 83.6 kg    Telemetry    Still in atrial flutter with poor rate control when active.- Personally Reviewed  ECG    Not repeated- Personally Reviewed  Physical Exam  Stable appearing, good skin color, no discomfort. GEN: No acute distress.   Neck: No JVD Cardiac: IRR, 2/6 right upper sternal border systolic murmur.  Mechanical valve closure sound from aortic region. Respiratory: Clear to auscultation bilaterally. GI: Soft, nontender, non-distended  MS:  No edema; No deformity. Neuro:  Nonfocal  Psych: Normal affect   Labs    Chemistry Recent Labs  Lab 08/11/18 1617 08/13/18 0328 08/14/18 0358  NA 139 138 137  K 4.2 4.2 4.8  CL 103 104 103  CO2 26 27 22   GLUCOSE 127* 145* 139*  BUN 9 10 13   CREATININE 1.20 1.33* 1.44*  CALCIUM 9.8 9.1 8.9  GFRNONAA 55* 49* 44*  GFRAA >60 56* 51*  ANIONGAP 10 7 12      Hematology Recent Labs  Lab 08/11/18 1617 08/13/18 0328 08/14/18 0358  WBC 8.3 6.7 6.7  RBC 5.02 4.67 4.56  HGB 16.5 15.2 14.9  HCT 53.4* 48.5 47.3  MCV 106.4* 103.9* 103.7*  MCH 32.9 32.5 32.7  MCHC 30.9 31.3 31.5  RDW 12.8 12.8 12.6  PLT 206 179 173    Cardiac Enzymes Recent Labs  Lab 08/12/18 0225 08/12/18 0703 08/14/18 0358 08/14/18 0622  TROPONINI <0.03 <0.03 <0.03 <0.03    Recent Labs  Lab 08/11/18 1637  TROPIPOC 0.00     BNPNo results for input(s): BNP, PROBNP in the last 168 hours.   DDimer  Recent Labs  Lab 08/11/18 1736  DDIMER <0.27     Radiology  No results found.  Cardiac Studies   No new data  Patient Profile     84 y.o. male with a hx of hypertension, hypothyroidism, paroxysmal atrialflutteron Coumadin, history of mechanical aortic valve replacement requiring Coumadin, and history of CADs/pCABG in 2000who presented with chest pain and weakness and was found to be in recurrent atrial flutter with poor rate control.  Assessment & Plan    1. Atrial flutter/fibrillation: Still in atrial flutter despite IV amiodarone.  Planning for cardioversion tomorrow if he does not have pharmacologic conversion.  Will convert to oral amiodarone therapy after cardioversion.  Plan to resume Coumadin this evening. 2. Mechanical aortic valve - INR this a.m. 1.7 3. CAD/angina pectoris has not recurred since admission. 4. Anticoagulation- plan to resume Coumadin this evening 5. Amiodarone therapy will be converted to oral after cardioversion tomorrow.  Overall plan is coronary  angiography today.  Resume Coumadin this evening with overlap heparin.  Plan electrical cardioversion tomorrow.  Convert to oral amiodarone tomorrow after cardioversion.  Will write pre-cardioversion orders today.   For questions or updates, please contact [place] Please consult [email or web address] for contact info under Cardiology/STEMI.      Signed, Sinclair Grooms, MD 08/14/2018, 9:30 AM

## 2018-08-14 NOTE — Progress Notes (Addendum)
Began to take air from the TR Band 4cc removed. Pt site began oozing. 4cc placed back in to TR band. TR at 11 cc. RN will continue to monitor.

## 2018-08-14 NOTE — H&P (View-Only) (Signed)
1        Progress Note  Patient Name: Jason Santana Date of Encounter: 08/14/2018  Primary Cardiologist: Belva Crome III, MD   Subjective   No chest pain overnight.  Denies dyspnea.  No issues with bleeding.  Had breakfast this morning.  Scheduled for 4 PM.  May be able to go earlier.  Inpatient Medications    Scheduled Meds: . finasteride  5 mg Oral QHS  . levothyroxine  75 mcg Oral QAC breakfast  . metoprolol tartrate  50 mg Oral BID  . multivitamin  1 tablet Oral Daily  . pantoprazole  40 mg Oral BID  . rosuvastatin  5 mg Oral Daily  . sodium chloride flush  3 mL Intravenous Q12H  . tamsulosin  0.4 mg Oral QHS   Continuous Infusions: . sodium chloride    . sodium chloride 1 mL/kg/hr (08/14/18 0520)  . amiodarone 30 mg/hr (08/14/18 0048)  . heparin 1,300 Units/hr (08/14/18 0855)   PRN Meds: sodium chloride, acetaminophen, morphine injection, ondansetron (ZOFRAN) IV, sodium chloride flush   Vital Signs    Vitals:   08/13/18 2331 08/14/18 0300 08/14/18 0804 08/14/18 0902  BP: (!) 115/54 119/64 131/77 126/70  Pulse:   78 83  Resp:  19 (!) 22   Temp:  98.1 F (36.7 C) (!) 97.3 F (36.3 C)   TempSrc:  Oral Oral   SpO2:   97%   Weight:  83.6 kg    Height:        Intake/Output Summary (Last 24 hours) at 08/14/2018 0924 Last data filed at 08/14/2018 0920 Gross per 24 hour  Intake 209.39 ml  Output -  Net 209.39 ml   Filed Weights   08/11/18 1602 08/12/18 0002 08/14/18 0300  Weight: 84.8 kg 83.6 kg 83.6 kg    Telemetry    Still in atrial flutter with poor rate control when active.- Personally Reviewed  ECG    Not repeated- Personally Reviewed  Physical Exam  Stable appearing, good skin color, no discomfort. GEN: No acute distress.   Neck: No JVD Cardiac: IRR, 2/6 right upper sternal border systolic murmur.  Mechanical valve closure sound from aortic region. Respiratory: Clear to auscultation bilaterally. GI: Soft, nontender, non-distended  MS:  No edema; No deformity. Neuro:  Nonfocal  Psych: Normal affect   Labs    Chemistry Recent Labs  Lab 08/11/18 1617 08/13/18 0328 08/14/18 0358  NA 139 138 137  K 4.2 4.2 4.8  CL 103 104 103  CO2 26 27 22   GLUCOSE 127* 145* 139*  BUN 9 10 13   CREATININE 1.20 1.33* 1.44*  CALCIUM 9.8 9.1 8.9  GFRNONAA 55* 49* 44*  GFRAA >60 56* 51*  ANIONGAP 10 7 12      Hematology Recent Labs  Lab 08/11/18 1617 08/13/18 0328 08/14/18 0358  WBC 8.3 6.7 6.7  RBC 5.02 4.67 4.56  HGB 16.5 15.2 14.9  HCT 53.4* 48.5 47.3  MCV 106.4* 103.9* 103.7*  MCH 32.9 32.5 32.7  MCHC 30.9 31.3 31.5  RDW 12.8 12.8 12.6  PLT 206 179 173    Cardiac Enzymes Recent Labs  Lab 08/12/18 0225 08/12/18 0703 08/14/18 0358 08/14/18 0622  TROPONINI <0.03 <0.03 <0.03 <0.03    Recent Labs  Lab 08/11/18 1637  TROPIPOC 0.00     BNPNo results for input(s): BNP, PROBNP in the last 168 hours.   DDimer  Recent Labs  Lab 08/11/18 1736  DDIMER <0.27     Radiology  No results found.  Cardiac Studies   No new data  Patient Profile     84 y.o. male with a hx of hypertension, hypothyroidism, paroxysmal atrialflutteron Coumadin, history of mechanical aortic valve replacement requiring Coumadin, and history of CADs/pCABG in 2000who presented with chest pain and weakness and was found to be in recurrent atrial flutter with poor rate control.  Assessment & Plan    1. Atrial flutter/fibrillation: Still in atrial flutter despite IV amiodarone.  Planning for cardioversion tomorrow if he does not have pharmacologic conversion.  Will convert to oral amiodarone therapy after cardioversion.  Plan to resume Coumadin this evening. 2. Mechanical aortic valve - INR this a.m. 1.7 3. CAD/angina pectoris has not recurred since admission. 4. Anticoagulation- plan to resume Coumadin this evening 5. Amiodarone therapy will be converted to oral after cardioversion tomorrow.  Overall plan is coronary  angiography today.  Resume Coumadin this evening with overlap heparin.  Plan electrical cardioversion tomorrow.  Convert to oral amiodarone tomorrow after cardioversion.  Will write pre-cardioversion orders today.   For questions or updates, please contact [place] Please consult [email or web address] for contact info under Cardiology/STEMI.      Signed, Sinclair Grooms, MD 08/14/2018, 9:30 AM

## 2018-08-14 NOTE — Plan of Care (Signed)
  Problem: Education: Goal: Knowledge of General Education information will improve Description Including pain rating scale, medication(s)/side effects and non-pharmacologic comfort measures 08/14/2018 0355 by Mikey Bussing, RN Outcome: Progressing 08/14/2018 0355 by Mikey Bussing, RN Outcome: Progressing 08/14/2018 0354 by Mikey Bussing, RN Outcome: Progressing   Problem: Health Behavior/Discharge Planning: Goal: Ability to manage health-related needs will improve 08/14/2018 0355 by Mikey Bussing, RN Outcome: Progressing 08/14/2018 0355 by Mikey Bussing, RN Outcome: Progressing 08/14/2018 0354 by Mikey Bussing, RN Outcome: Progressing   Problem: Clinical Measurements: Goal: Ability to maintain clinical measurements within normal limits will improve 08/14/2018 0355 by Mikey Bussing, RN Outcome: Progressing 08/14/2018 0355 by Mikey Bussing, RN Outcome: Progressing Goal: Diagnostic test results will improve 08/14/2018 0355 by Mikey Bussing, RN Outcome: Progressing 08/14/2018 0355 by Mikey Bussing, RN Outcome: Progressing 08/14/2018 0354 by Mikey Bussing, RN Outcome: Progressing Goal: Respiratory complications will improve Outcome: Progressing Goal: Cardiovascular complication will be avoided Outcome: Progressing   Problem: Elimination: Goal: Will not experience complications related to urinary retention Outcome: Progressing   Problem: Pain Managment: Goal: General experience of comfort will improve Outcome: Progressing   Problem: Skin Integrity: Goal: Risk for impaired skin integrity will decrease Outcome: Progressing   Problem: Clinical Measurements: Goal: Will remain free from infection 08/14/2018 0355 by Mikey Bussing, RN Outcome: Adequate for Discharge 08/14/2018 0355 by Mikey Bussing, RN Outcome: Progressing 08/14/2018 0354 by Mikey Bussing, RN Outcome: Progressing   Problem: Activity: Goal: Risk for activity intolerance will decrease Outcome: Adequate for Discharge   Problem:  Nutrition: Goal: Adequate nutrition will be maintained Outcome: Adequate for Discharge   Problem: Coping: Goal: Level of anxiety will decrease Outcome: Adequate for Discharge   Problem: Elimination: Goal: Will not experience complications related to bowel motility Outcome: Adequate for Discharge   Problem: Safety: Goal: Ability to remain free from injury will improve Outcome: Adequate for Discharge

## 2018-08-14 NOTE — Progress Notes (Signed)
PROGRESS NOTE    Jason Santana  ALP:[id number] DOB: December 24, 1931 DOA: 08/11/2018 PCP: [name], MD   Brief Narrative:  84 year old with past medical history of mitral valve replacement on Coumadin, essential hypertension, atrial fibrillation, CAD status post CABG, hypothyroidism came to the hospital with complaints of chest pain.  Patient states he mostly get this chest pain during exertion.  He was also noted to be in atrial fibrillation with RVR.  Cardiology following the patient.  Plans for left heart catheterization 12/12 and cardioversion tomorrow.   Assessment & Plan:   Active Problems:   Paroxysmal atrial flutter (HCC)   Essential hypertension   H/O mechanical aortic valve replacement   CAD (coronary artery disease) of artery bypass graft   Chronic anticoagulation   Hypothyroidism   Atypical atrial flutter (HCC)   Chest pain   Atypical chest pain -This is concerning given his previous history.  Cardiac enzymes remain negative at this time.  2D echo showed preserved ejection fraction without any obvious wall motion abnormality. -Patient is currently chest pain-free.  Currently awaiting left heart catheterization for today.  Plans for cardioversion tomorrow. - Should be able to resume home regimen of Coumadin after the cardioversion.  Atrial fibrillation/flutter with RVR, improved -Metoprolol 50 mg twice daily.  On anticoagulation-Heparin drip -Continue amiodarone which can be converted to oral after the procedure - Should be able to resume Coumadin after the procedure and overlap with heparin drip.  Coronary artery disease status post bypass -Currently chest pain-free.  Cardiology is following  Essential hypertension -Continue metoprolol.  Hypothyroidism -continue Synthroid  History of mechanical valve replacement -Currently on heparin drip.  Plans to resume Coumadin after the procedure  GERD -Continue Protonix 40 mg daily  BPH -Continue Flomax and  finasteride   DVT prophylaxis: Heparin drip Code Status:  partial code Family Communication: Wife at bedside Disposition Plan: Maintain inpatient stay for left heart catheterization later today and cardioversion tomorrow.  Consultants:   Cardiology  Procedures:   None so far  Antimicrobials:   None   Subjective: No new complaints, no acute events overnight. Review of Systems Otherwise negative except as per HPI, including: General = no fevers, chills, dizziness, malaise, fatigue HEENT/EYES = negative for pain, redness, loss of vision, double vision, blurred vision, loss of hearing, sore throat, hoarseness, dysphagia Cardiovascular= negative for chest pain, palpitation, murmurs, lower extremity swelling Respiratory/lungs= negative for shortness of breath, cough, hemoptysis, wheezing, mucus production Gastrointestinal= negative for nausea, vomiting,, abdominal pain, melena, hematemesis Genitourinary= negative for Dysuria, Hematuria, Change in Urinary Frequency MSK = Negative for arthralgia, myalgias, Back Pain, Joint swelling  Neurology= Negative for headache, seizures, numbness, tingling  Psychiatry= Negative for anxiety, depression, suicidal and homocidal ideation Allergy/Immunology= Medication/Food allergy as listed  Skin= Negative for Rash, lesions, ulcers, itching   Objective: Vitals:   08/14/18 0300 08/14/18 0804 08/14/18 0902 08/14/18 1157  BP: 119/64 131/77 126/70 136/63  Pulse:  78 83 63  Resp: 19 (!) 22  (!) 23  Temp: 98.1 F (36.7 C) (!) 97.3 F (36.3 C)  (!) 97.4 F (36.3 C)  TempSrc: Oral Oral  Oral  SpO2:  97%  98%  Weight: 83.6 kg     Height:        Intake/Output Summary (Last 24 hours) at 08/14/2018 1518 Last data filed at 08/14/2018 0920 Gross per 24 hour  Intake 10 ml  Output -  Net 10 ml   Filed Weights   08/11/18 1602 08/12/18 0002 08/14/18 0300  Weight:  84.8 kg 83.6 kg 83.6 kg    Examination:  Constitutional: NAD, calm,  comfortable Eyes: PERRL, lids and conjunctivae normal ENMT: Mucous membranes are moist. Posterior pharynx clear of any exudate or lesions.Normal dentition.  Neck: normal, supple, no masses, no thyromegaly Respiratory: clear to auscultation bilaterally, no wheezing, no crackles. Normal respiratory effort. No accessory muscle use.  Cardiovascular: Irregularly irregular, no murmurs / rubs / gallops. No extremity edema. 2+ pedal pulses. No carotid bruits.  Abdomen: no tenderness, no masses palpated. No hepatosplenomegaly. Bowel sounds positive.  Musculoskeletal: no clubbing / cyanosis. No joint deformity upper and lower extremities. Good ROM, no contractures. Normal muscle tone.  Skin: no rashes, lesions, ulcers. No induration Neurologic: CN 2-12 grossly intact. Sensation intact, DTR normal. Strength 5/5 in all 4.  Psychiatric: Normal judgment and insight. Alert and oriented x 3. Normal mood.    Data Reviewed:   CBC: Recent Labs  Lab 08/11/18 1617 08/13/18 0328 08/14/18 0358  WBC 8.3 6.7 6.7  HGB 16.5 15.2 14.9  HCT 53.4* 48.5 47.3  MCV 106.4* 103.9* 103.7*  PLT 206 179 102   Basic Metabolic Panel: Recent Labs  Lab 08/11/18 1617 08/13/18 0328 08/14/18 0358  NA 139 138 137  K 4.2 4.2 4.8  CL 103 104 103  CO2 26 27 22   GLUCOSE 127* 145* 139*  BUN 9 10 13   CREATININE 1.20 1.33* 1.44*  CALCIUM 9.8 9.1 8.9   GFR: Estimated Creatinine Clearance: 39.4 mL/min (A) (by C-G formula based on SCr of 1.44 mg/dL (H)). Liver Function Tests: No results for input(s): AST, ALT, ALKPHOS, BILITOT, PROT, ALBUMIN in the last 168 hours. No results for input(s): LIPASE, AMYLASE in the last 168 hours. No results for input(s): AMMONIA in the last 168 hours. Coagulation Profile: Recent Labs  Lab 08/11/18 1955 08/12/18 0703 08/13/18 0328 08/13/18 1253 08/14/18 0358  INR 2.65 2.67 2.27 1.89 1.69   Cardiac Enzymes: Recent Labs  Lab 08/12/18 0225 08/12/18 0703 08/14/18 0358 08/14/18 0622  08/14/18 0949  TROPONINI <0.03 <0.03 <0.03 <0.03 <0.03   BNP (last 3 results) No results for input(s): PROBNP in the last 8760 hours. HbA1C: Recent Labs    08/12/18 0225  HGBA1C 5.7*   CBG: No results for input(s): GLUCAP in the last 168 hours. Lipid Profile: Recent Labs    08/12/18 0225  CHOL 199  HDL 26*  LDLCALC 125*  TRIG 241*  CHOLHDL 7.7   Thyroid Function Tests: Recent Labs    08/12/18 1439  TSH 4.958*   Anemia Panel: No results for input(s): VITAMINB12, FOLATE, FERRITIN, TIBC, IRON, RETICCTPCT in the last 72 hours. Sepsis Labs: No results for input(s): PROCALCITON, LATICACIDVEN in the last 168 hours.  Recent Results (from the past 240 hour(s))  MRSA PCR Screening     Status: None   Collection Time: 08/12/18 12:59 AM  Result Value Ref Range Status   MRSA by PCR NEGATIVE NEGATIVE Final    Comment:        The GeneXpert MRSA Assay (FDA approved for NASAL specimens only), is one component of a comprehensive MRSA colonization surveillance program. It is not intended to diagnose MRSA infection nor to guide or monitor treatment for MRSA infections. Performed at [facility], [place] [address], [place], [place] [address]          Radiology Studies: No results found.      Scheduled Meds: . finasteride  5 mg Oral QHS  . levothyroxine  75 mcg Oral QAC breakfast  .  metoprolol tartrate  50 mg Oral BID  . multivitamin  1 tablet Oral Daily  . pantoprazole  40 mg Oral BID  . rosuvastatin  5 mg Oral Daily  . sodium chloride flush  3 mL Intravenous Q12H  . sodium chloride flush  3 mL Intravenous Q12H  . tamsulosin  0.4 mg Oral QHS   Continuous Infusions: . sodium chloride    . sodium chloride Stopped (08/14/18 1437)  . sodium chloride 1 mL/kg/hr (08/14/18 1438)  . amiodarone 30 mg/hr (08/14/18 1246)  . heparin 1,300 Units/hr (08/14/18 0855)     LOS: 2 days   Time spent= 20 mins    Tyhir Schwan Arsenio Loader, MD [facility] Pager  [phone]   If 7PM-7AM, please contact night-coverage [email or web address] Password [email or web address] 08/14/2018, 3:18 PM

## 2018-08-14 NOTE — Progress Notes (Signed)
[place] for heparin (hold warfarin) Indication: Atrial Fibrillation + mechanical AVR  Patient Measurements: Height: 5\' 10"  (177.8 cm) Weight: 184 lb 4.9 oz (83.6 kg) IBW/kg (Calculated) : 73   Vital Signs: Temp: 97.4 F (36.3 C) (12/12 1157) Temp Source: Oral (12/12 1157) BP: 136/63 (12/12 1157) Pulse Rate: 63 (12/12 1157)  Labs: Recent Labs    08/13/18 0328 08/13/18 1253 08/13/18 2235 08/14/18 0358 08/14/18 0622 08/14/18 0949  HGB 15.2  --   --  14.9  --   --   HCT 48.5  --   --  47.3  --   --   PLT 179  --   --  173  --   --   LABPROT 24.7* 21.5*  --  19.7*  --   --   INR 2.27 1.89  --  1.69  --   --   HEPARINUNFRC  --   --  0.29*  --  0.50  --   CREATININE 1.33*  --   --  1.44*  --   --   TROPONINI  --   --   --  <0.03 <0.03 <0.03     Medical History: Past Medical History:  Diagnosis Date  . Atrial fibrillation ([place])   . CAD (coronary artery disease) of artery bypass graft 2000   2000 with multiple bypasses   . Chronic anticoagulation    Coumadin therapy for mechanical aortic valve. Postoperative atrial fibrillation.   . Essential hypertension   . H/O mechanical aortic valve replacement 2000   [employer] AVR  . Hypothyroidism   . Premature ventricular contractions     Assessment: 84 yo male with AFib and mechanical AVR (INR goal 2.5-3.0) admitted with recurrent AFib RVR and CP. He is now post cath to restart heparin and coumadin. Sheath removed ~ 4:53pm (radial approach) -last heparin rate was 1300 units/hr and heparin level = 0.5 -INR= 1.69  PTA warfarin: 5 mg on Mon/Wed/Fri, 7.5 mg on all other days  Goal of Therapy:  Heparin level 0.3-0.7 units/ml Monitor platelets by anticoagulation protocol: Yes   Plan:  -Restart heparin at 1300 units/hr 8 hours post sheath removal -Heparin level in 8 hours and daily wth CBC daily -Coumadin 10mg  po today -Daily PT/INR  Hildred Laser, PharmD Clinical  Pharmacist **Pharmacist phone directory can now be found on [email or web address] ([email or web address]).  Listed under [place].

## 2018-08-15 ENCOUNTER — Inpatient Hospital Stay (HOSPITAL_COMMUNITY): Payer: Medicare Other | Admitting: Anesthesiology

## 2018-08-15 ENCOUNTER — Encounter (HOSPITAL_COMMUNITY): Payer: Self-pay | Admitting: Cardiovascular Disease

## 2018-08-15 ENCOUNTER — Encounter (HOSPITAL_COMMUNITY)
Admission: EM | Disposition: A | Discharge status: Home / Self Care | Payer: Self-pay | Source: Home / Self Care | Attending: Internal Medicine

## 2018-08-15 DIAGNOSIS — R079 Chest pain, unspecified: Secondary | ICD-10-CM

## 2018-08-15 HISTORY — PX: CARDIOVERSION: SHX1299

## 2018-08-15 LAB — CBC
HCT: 45.9 % (ref 39.0–52.0)
Hemoglobin: 14.5 g/dL (ref 13.0–17.0)
MCH: 32.8 pg (ref 26.0–34.0)
MCHC: 31.6 g/dL (ref 30.0–36.0)
MCV: 103.8 fL — ABNORMAL HIGH (ref 80.0–100.0)
PLATELETS: 169 10*3/uL (ref 150–400)
RBC: 4.42 MIL/uL (ref 4.22–5.81)
RDW: 12.8 % (ref 11.5–15.5)
WBC: 7.5 10*3/uL (ref 4.0–10.5)
nRBC: 0 % (ref 0.0–0.2)

## 2018-08-15 LAB — HEPARIN LEVEL (UNFRACTIONATED)
HEPARIN UNFRACTIONATED: 0.72 [IU]/mL — AB (ref 0.30–0.70)
Heparin Unfractionated: 0.79 IU/mL — ABNORMAL HIGH (ref 0.30–0.70)

## 2018-08-15 LAB — PROTIME-INR
INR: 1.38
PROTHROMBIN TIME: 16.9 s — AB (ref 11.4–15.2)

## 2018-08-15 SURGERY — CARDIOVERSION
Anesthesia: General

## 2018-08-15 MED ORDER — PROPOFOL 10 MG/ML IV BOLUS
INTRAVENOUS | Status: DC | PRN
  Administered 2018-08-15: 50 mg via INTRAVENOUS

## 2018-08-15 MED ORDER — WARFARIN SODIUM 10 MG PO TABS
10.0000 mg | ORAL_TABLET | Freq: Once | ORAL | Status: AC
  Administered 2018-08-15: 10 mg via ORAL
  Filled 2018-08-15: qty 1

## 2018-08-15 MED ORDER — LIDOCAINE HCL (CARDIAC) PF 100 MG/5ML IV SOSY
PREFILLED_SYRINGE | INTRAVENOUS | Status: DC | PRN
  Administered 2018-08-15: 60 mg via INTRAVENOUS

## 2018-08-15 MED ORDER — AMIODARONE HCL 200 MG PO TABS
200.0000 mg | ORAL_TABLET | Freq: Two times a day (BID) | ORAL | Status: DC
  Administered 2018-08-15 – 2018-08-18 (×7): 200 mg via ORAL
  Filled 2018-08-15 (×7): qty 1

## 2018-08-15 NOTE — Care Management Important Message (Signed)
Important Message  Patient Details  Name: JANET DECESARE MRN: [id number] Date of Birth: February 12, 1932   Medicare Important Message Given:  Yes    Barb Merino Ikea Demicco 08/15/2018, 12:48 PM

## 2018-08-15 NOTE — Anesthesia Postprocedure Evaluation (Signed)
Anesthesia Post Note  Patient: [name]  Procedure(s) Performed: CARDIOVERSION (N/A )     Patient location during evaluation: PACU Anesthesia Type: General Level of consciousness: awake and alert Pain management: pain level controlled Vital Signs Assessment: post-procedure vital signs reviewed and stable Respiratory status: spontaneous breathing, nonlabored ventilation and respiratory function stable Cardiovascular status: blood pressure returned to baseline and stable Postop Assessment: no apparent nausea or vomiting Anesthetic complications: no    Last Vitals:  Vitals:   08/15/18 1005 08/15/18 1010  BP: (!) 121/51 (!) 111/53  Pulse: (!) 52 (!) 51  Resp: 18 19  Temp:    SpO2: 96% 96%    Last Pain:  Vitals:   08/15/18 1005  TempSrc: Oral  PainSc: 0-No pain                 Audry Pili

## 2018-08-15 NOTE — Progress Notes (Signed)
PROGRESS NOTE    Jason Santana  WJX:[id number] DOB: 09-29-1931 DOA: 08/11/2018 PCP: [name], MD   Brief Narrative:  84 year old with past medical history of mitral valve replacement on Coumadin, essential hypertension, atrial fibrillation, CAD status post CABG, hypothyroidism came to the hospital with complaints of chest pain.  Patient states he mostly get this chest pain during exertion.  He was also noted to be in atrial fibrillation with RVR.  Cardiology following the patient.  Plans for left heart catheterization 12/12 which showed previously all occluded vessels and 50% LAD stenosis.  Advised medical management at this time and resuming Coumadin.  Cardioversion performed on 12/13 was successful.   Assessment & Plan:   Active Problems:   Paroxysmal atrial flutter (HCC)   Essential hypertension   H/O mechanical aortic valve replacement   CAD (coronary artery disease) of artery bypass graft   Chronic anticoagulation   Hypothyroidism   Coronary artery disease involving native coronary artery of native heart with unstable angina pectoris (HCC)   Atypical atrial flutter (HCC)   Chest pain   Atypical chest pain Coronary artery disease status post CABG -This is concerning given his previous history.  Cardiac enzymes remain negative at this time.  2D echo showed preserved ejection fraction without any obvious wall motion abnormality.  Currently patient is chest pain-free -Left heart catheterization 12/12- previously occluded RCA, OM 2.  LAD is 50%.  Advised medical management -Cardioversion performed 12/13-successful  Atrial fibrillation/flutter with RVR, status post successful cardioversion 12/13 -Metoprolol 50 mg twice daily.  Heparin bridge to Coumadin -Continue amiodarone which can be converted to oral after the procedure  Essential hypertension -Continue metoprolol.  Hypothyroidism -continue Synthroid  History of mechanical valve replacement -Heparin bridge to  Coumadin  GERD -Continue Protonix 40 mg daily  BPH -Continue Flomax and finasteride   DVT prophylaxis: Heparin drip Code Status:  partial code Family Communication: Wife at bedside Disposition Plan: Maintain inpatient hospitalization until INR is therapeutic and cleared by cardiology.  Currently he remains on heparin drip.  Consultants:   Cardiology  Procedures:   Left heart catheterization 12/12  Cardioversion 12/13  Antimicrobials:   None   Subjective: Patient with successful cardioversion this morning.  No other acute events overnight.  Review of Systems Otherwise negative except as per HPI, including: General = no fevers, chills, dizziness, malaise, fatigue HEENT/EYES = negative for pain, redness, loss of vision, double vision, blurred vision, loss of hearing, sore throat, hoarseness, dysphagia Cardiovascular= negative for chest pain, palpitation, murmurs, lower extremity swelling Respiratory/lungs= negative for shortness of breath, cough, hemoptysis, wheezing, mucus production Gastrointestinal= negative for nausea, vomiting,, abdominal pain, melena, hematemesis Genitourinary= negative for Dysuria, Hematuria, Change in Urinary Frequency MSK = Negative for arthralgia, myalgias, Back Pain, Joint swelling  Neurology= Negative for headache, seizures, numbness, tingling  Psychiatry= Negative for anxiety, depression, suicidal and homocidal ideation Allergy/Immunology= Medication/Food allergy as listed  Skin= Negative for Rash, lesions, ulcers, itching    Objective: Vitals:   08/15/18 1005 08/15/18 1010 08/15/18 1020 08/15/18 1113  BP: (!) 121/51 (!) 111/53 (!) 120/53 (!) 143/67  Pulse: (!) 52 (!) 51 (!) 53 (!) 58  Resp: 18 19 20 16   Temp:    98 F (36.7 C)  TempSrc: Oral   Oral  SpO2: 96% 96% 96%   Weight:      Height:        Intake/Output Summary (Last 24 hours) at 08/15/2018 1244 Last data filed at 08/15/2018 1100 Gross per 24 hour  Intake 1062.23 ml   Output 0 ml  Net 1062.23 ml   Filed Weights   08/12/18 0002 08/14/18 0300 08/15/18 0854  Weight: 83.6 kg 83.6 kg 83.6 kg    Examination:  Constitutional: NAD, calm, comfortable Eyes: PERRL, lids and conjunctivae normal ENMT: Mucous membranes are moist. Posterior pharynx clear of any exudate or lesions.Normal dentition.  Neck: normal, supple, no masses, no thyromegaly Respiratory: clear to auscultation bilaterally, no wheezing, no crackles. Normal respiratory effort. No accessory muscle use.  Cardiovascular: Regular rate and rhythm, no murmurs / rubs / gallops. No extremity edema. 2+ pedal pulses. No carotid bruits.  Abdomen: no tenderness, no masses palpated. No hepatosplenomegaly. Bowel sounds positive.  Musculoskeletal: no clubbing / cyanosis. No joint deformity upper and lower extremities. Good ROM, no contractures. Normal muscle tone.  Skin: no rashes, lesions, ulcers. No induration Neurologic: CN 2-12 grossly intact. Sensation intact, DTR normal. Strength 5/5 in all 4.  Psychiatric: Normal judgment and insight. Alert and oriented x 3. Normal mood.    Data Reviewed:   CBC: Recent Labs  Lab 08/11/18 1617 08/13/18 0328 08/14/18 0358 08/15/18 0239  WBC 8.3 6.7 6.7 7.5  HGB 16.5 15.2 14.9 14.5  HCT 53.4* 48.5 47.3 45.9  MCV 106.4* 103.9* 103.7* 103.8*  PLT 206 179 173 542   Basic Metabolic Panel: Recent Labs  Lab 08/11/18 1617 08/13/18 0328 08/14/18 0358  NA 139 138 137  K 4.2 4.2 4.8  CL 103 104 103  CO2 26 27 22   GLUCOSE 706* 145* 139*  BUN 9 10 13   CREATININE 1.20 1.33* 1.44*  CALCIUM 9.8 9.1 8.9   GFR: Estimated Creatinine Clearance: 39.4 mL/min (A) (by C-G formula based on SCr of 1.44 mg/dL (H)). Liver Function Tests: No results for input(s): AST, ALT, ALKPHOS, BILITOT, PROT, ALBUMIN in the last 168 hours. No results for input(s): LIPASE, AMYLASE in the last 168 hours. No results for input(s): AMMONIA in the last 168 hours. Coagulation  Profile: Recent Labs  Lab 08/12/18 0703 08/13/18 0328 08/13/18 1253 08/14/18 0358 08/15/18 0239  INR 2.67 2.27 1.89 1.69 1.38   Cardiac Enzymes: Recent Labs  Lab 08/12/18 0225 08/12/18 0703 08/14/18 0358 08/14/18 0622 08/14/18 0949  TROPONINI <0.03 <0.03 <0.03 <0.03 <0.03   BNP (last 3 results) No results for input(s): PROBNP in the last 8760 hours. HbA1C: No results for input(s): HGBA1C in the last 72 hours. CBG: No results for input(s): GLUCAP in the last 168 hours. Lipid Profile: No results for input(s): CHOL, HDL, LDLCALC, TRIG, CHOLHDL, LDLDIRECT in the last 72 hours. Thyroid Function Tests: Recent Labs    08/12/18 1439  TSH 4.958*   Anemia Panel: No results for input(s): VITAMINB12, FOLATE, FERRITIN, TIBC, IRON, RETICCTPCT in the last 72 hours. Sepsis Labs: No results for input(s): PROCALCITON, LATICACIDVEN in the last 168 hours.  Recent Results (from the past 240 hour(s))  MRSA PCR Screening     Status: None   Collection Time: 08/12/18 12:59 AM  Result Value Ref Range Status   MRSA by PCR NEGATIVE NEGATIVE Final    Comment:        The GeneXpert MRSA Assay (FDA approved for NASAL specimens only), is one component of a comprehensive MRSA colonization surveillance program. It is not intended to diagnose MRSA infection nor to guide or monitor treatment for MRSA infections. Performed at [facility], [place] [address], [place], [place] [address]          Radiology Studies: No results found.  Scheduled Meds: . amiodarone  200 mg Oral BID  . finasteride  5 mg Oral QHS  . levothyroxine  75 mcg Oral QAC breakfast  . metoprolol tartrate  50 mg Oral BID  . multivitamin  1 tablet Oral Daily  . pantoprazole  40 mg Oral BID  . rosuvastatin  5 mg Oral Daily  . sodium chloride flush  3 mL Intravenous Q12H  . sodium chloride flush  3 mL Intravenous Q12H  . tamsulosin  0.4 mg Oral QHS  . warfarin  10 mg Oral ONCE-1800  . Warfarin -  Pharmacist Dosing Inpatient   Does not apply q1800   Continuous Infusions: . sodium chloride 1 mL/hr at 08/14/18 1437  . sodium chloride    . heparin 1,250 Units/hr (08/15/18 1232)     LOS: 3 days   Time spent= 20 mins    Ankit Arsenio Loader, MD [facility] Pager [phone]   If 7PM-7AM, please contact night-coverage [email or web address] Password [facility] 08/15/2018, 12:44 PM

## 2018-08-15 NOTE — Progress Notes (Addendum)
[place] for heparin (hold warfarin) Indication: Atrial Fibrillation + mechanical AVR  Patient Measurements: Height: 5\' 10"  (177.8 cm) Weight: 184 lb 4.9 oz (83.6 kg) IBW/kg (Calculated) : 73   Vital Signs: Temp: 98 F (36.7 C) (12/13 1113) Temp Source: Oral (12/13 1113) BP: 143/67 (12/13 1113) Pulse Rate: 58 (12/13 1113)  Labs: Recent Labs    08/13/18 0328 08/13/18 1253 08/13/18 2235 08/14/18 0358 08/14/18 0622 08/14/18 0949 08/15/18 0239 08/15/18 1047  HGB 15.2  --   --  14.9  --   --  14.5  --   HCT 48.5  --   --  47.3  --   --  45.9  --   PLT 179  --   --  173  --   --  169  --   LABPROT 24.7* 21.5*  --  19.7*  --   --  16.9*  --   INR 2.27 1.89  --  1.69  --   --  1.38  --   HEPARINUNFRC  --   --  0.29*  --  0.50  --   --  0.72*  CREATININE 1.33*  --   --  1.44*  --   --   --   --   TROPONINI  --   --   --  <0.03 <0.03 <0.03  --   --      Medical History: Past Medical History:  Diagnosis Date  . Atrial fibrillation ([place])   . CAD (coronary artery disease) of artery bypass graft 2000   2000 with multiple bypasses   . Chronic anticoagulation    Coumadin therapy for mechanical aortic valve. Postoperative atrial fibrillation.   . Essential hypertension   . H/O mechanical aortic valve replacement 2000   [employer] AVR  . Hypothyroidism   . Premature ventricular contractions     Assessment: 84 yo male with AFib and mechanical AVR (INR goal 2.5-3.0) admitted with recurrent AFib RVR and CP. He is now post cath to restart heparin and coumadin until INR therapeutic.   Underwent successful cardioversion on 12/13. Heparin level came back slightly supratherapeutic at 0.72, on 1300 units/hr. Hgb 14.5, plt 169. No s/sx of bleeding. No infusion issues. INR today continues to drift down from 1.69 to 1.38 - warfarin was not given on 12/12 for unclear reason (has been held since 12/9).  PTA warfarin: 5 mg on Mon/Wed/Fri, 7.5 mg on all  other days  Goal of Therapy:  Heparin level 0.3-0.7 units/ml Monitor platelets by anticoagulation protocol: Yes   Plan:  -Reduce heparin infusion to 1250 units/hr  -Heparin level in 8 hours and daily wth CBC daily -Coumadin 10mg  po tonight  -Daily PT/INR  Antonietta Jewel, PharmD, BCCCP Clinical Pharmacist  Pager: [phone] Phone: [phone] **Pharmacist phone directory can now be found on [email or web address] ([email or web address]).  Listed under [place].

## 2018-08-15 NOTE — Plan of Care (Signed)

## 2018-08-15 NOTE — Transfer of Care (Signed)
Immediate Anesthesia Transfer of Care Note  Patient: Jason Santana  Procedure(s) Performed: CARDIOVERSION (N/A )  Patient Location: Endoscopy Unit  Anesthesia Type:MAC  Level of Consciousness: awake  Airway & Oxygen Therapy: Patient Spontanous Breathing  Post-op Assessment: Report given to RN and Post -op Vital signs reviewed and stable  Post vital signs: Reviewed and stable  Last Vitals:  Vitals Value Taken Time  BP    Temp    Pulse    Resp    SpO2      Last Pain:  Vitals:   08/15/18 0854  TempSrc: Oral  PainSc: 0-No pain         Complications: No apparent anesthesia complications

## 2018-08-15 NOTE — Progress Notes (Addendum)
1        Progress Note  Patient Name: Jason Santana Date of Encounter: 08/15/2018  Primary Cardiologist: Sinclair Grooms, MD   Subjective   No issues overnight.  Angiography demonstrated a widely patent saphenous vein grafts and LIMA to LAD.  Therefore angina was likely related to atrial flutter and demand.  Remains in atrial flutter  Inpatient Medications    Scheduled Meds: . finasteride  5 mg Oral QHS  . levothyroxine  75 mcg Oral QAC breakfast  . metoprolol tartrate  50 mg Oral BID  . multivitamin  1 tablet Oral Daily  . pantoprazole  40 mg Oral BID  . rosuvastatin  5 mg Oral Daily  . sodium chloride flush  3 mL Intravenous Q12H  . sodium chloride flush  3 mL Intravenous Q12H  . tamsulosin  0.4 mg Oral QHS  . warfarin  10 mg Oral Once  . Warfarin - Pharmacist Dosing Inpatient   Does not apply q1800   Continuous Infusions: . sodium chloride 1 mL/hr at 08/14/18 1437  . sodium chloride    . amiodarone 30 mg/hr (08/15/18 0631)  . heparin Stopped (08/14/18 1552)  . heparin 1,300 Units/hr (08/15/18 0631)   PRN Meds: sodium chloride, acetaminophen, morphine injection, ondansetron (ZOFRAN) IV, sodium chloride flush, sodium chloride flush   Vital Signs    Vitals:   08/14/18 1652 08/14/18 2025 08/14/18 2339 08/15/18 0347  BP: (!) 166/87 122/65 119/67 125/65  Pulse: 80 77  72  Resp: 18 20 19 16   Temp:  97.9 F (36.6 C) 98.1 F (36.7 C) 97.7 F (36.5 C)  TempSrc:  Oral Oral Oral  SpO2: 97%   98%  Weight:      Height:        Intake/Output Summary (Last 24 hours) at 08/15/2018 0814 Last data filed at 08/15/2018 0631 Gross per 24 hour  Intake 1022.23 ml  Output -  Net 1022.23 ml   Filed Weights   08/11/18 1602 08/12/18 0002 08/14/18 0300  Weight: 84.8 kg 83.6 kg 83.6 kg    Telemetry    Atrial flutter with variable response- Personally Reviewed  ECG    Electrocardiogram performed on 08/13/2018 was personally reviewed and reveals interventricular  conduction delay/left bundle branch block with atrial flutter and variable AV conduction.- Personally Reviewed  Physical Exam  Compensated/comfortable. GEN: No acute distress.   Neck: No JVD Cardiac: RRR, no murmurs, rubs, or gallops.  Left radial vascular access site with hematoma above the access site, small and and not tight. Respiratory: Clear to auscultation bilaterally. GI: Soft, nontender, non-distended  MS: No edema; No deformity. Neuro:  Nonfocal  Psych: Normal affect   Labs    Chemistry Recent Labs  Lab 08/11/18 1617 08/13/18 0328 08/14/18 0358  NA 139 138 137  K 4.2 4.2 4.8  CL 103 104 103  CO2 26 27 22   GLUCOSE 127* 145* 139*  BUN 9 10 13   CREATININE 1.20 1.33* 1.44*  CALCIUM 9.8 9.1 8.9  GFRNONAA 55* 49* 44*  GFRAA >60 56* 51*  ANIONGAP 10 7 12      Hematology Recent Labs  Lab 08/13/18 0328 08/14/18 0358 08/15/18 0239  WBC 6.7 6.7 7.5  RBC 4.67 4.56 4.42  HGB 15.2 14.9 14.5  HCT 48.5 47.3 45.9  MCV 103.9* 103.7* 103.8*  MCH 32.5 32.7 32.8  MCHC 31.3 31.5 31.6  RDW 12.8 12.6 12.8  PLT 179 173 169    Cardiac Enzymes Recent Labs  Lab  08/12/18 0703 08/14/18 0358 08/14/18 0622 08/14/18 0949  TROPONINI <0.03 <0.03 <0.03 <0.03    Recent Labs  Lab 08/11/18 1637  TROPIPOC 0.00     BNPNo results for input(s): BNP, PROBNP in the last 168 hours.   DDimer  Recent Labs  Lab 08/11/18 1736  DDIMER <0.27     Radiology    No results found.  Cardiac Studies   Cardiac catheterization 08/14/2018: Diagnostic  Dominance: Right     Patient Profile     84 y.o. male with a hx of hypertension, hypothyroidism,paroxysmalatrialflutteron Coumadin, history of mechanical aortic valve replacementrequiring Coumadin,and history of CADs/pCABG in 2000whopresented with chest pain and weakness and was found to be in recurrent atrial flutter with poor rate control.  Coronaries are clean on repeat cath.  Electrical cardioversion planned  08/15/2018.  Assessment & Plan    1. Atrial flutter -continues and plan is for electrical cardioversion later today.  After cardioversion will convert to p.o. amiodarone. 2. Mechanical aortic valve  -Coumadin resumed last evening after cath.  IV heparin is continuing. 3. CAD/CABG: Grafts are widely patent. 4. Anticoagulation: Now on Coumadin which was resumed after cath.  Back on IV heparin.  Anticipating electrical cardioversion today. 5. Will convert to oral amiodarone after cardioversion today.  Probably 200 mg twice daily for 2 weeks then 200 mg daily thereafter.  Hoping for successful electrical cardioversion today.  Discharge when INR is approaching 2.0.  This will likely be at some point over the weekend.     For questions or updates, please contact [place] Please consult [email or web address] for contact info under Cardiology/STEMI.      Signed, Sinclair Grooms, MD  08/15/2018, 8:14 AM

## 2018-08-15 NOTE — CV Procedure (Signed)
    Cardioversion Note  Jason Santana [id number] 1932-01-09  Procedure: DC Cardioversion Indications: Atrial fib   Procedure Details Consent: Obtained Time Out: Verified patient identification, verified procedure, site/side was marked, verified correct patient position, special equipment/implants available, Radiology Safety Procedures followed,  medications/allergies/relevent history reviewed, required imaging and test results available.  Performed  The patient has been on adequate anticoagulation.  The patient received IV Lidocaine 60 mg followed by Propofol 50 mg IV  for sedation.  Synchronous cardioversion was performed at 120  joules.  The cardioversion was successful     Complications: No apparent complications Patient did tolerate procedure well.   Thayer Headings, Brooke Bonito., MD, [facility] 08/15/2018, 10:06 AM

## 2018-08-15 NOTE — H&P (View-Only) (Signed)
1        Progress Note  Patient Name: Jason Santana Date of Encounter: 08/15/2018  Primary Cardiologist: Sinclair Grooms, MD   Subjective   No issues overnight.  Angiography demonstrated a widely patent saphenous vein grafts and LIMA to LAD.  Therefore angina was likely related to atrial flutter and demand.  Remains in atrial flutter  Inpatient Medications    Scheduled Meds: . finasteride  5 mg Oral QHS  . levothyroxine  75 mcg Oral QAC breakfast  . metoprolol tartrate  50 mg Oral BID  . multivitamin  1 tablet Oral Daily  . pantoprazole  40 mg Oral BID  . rosuvastatin  5 mg Oral Daily  . sodium chloride flush  3 mL Intravenous Q12H  . sodium chloride flush  3 mL Intravenous Q12H  . tamsulosin  0.4 mg Oral QHS  . warfarin  10 mg Oral Once  . Warfarin - Pharmacist Dosing Inpatient   Does not apply q1800   Continuous Infusions: . sodium chloride 1 mL/hr at 08/14/18 1437  . sodium chloride    . amiodarone 30 mg/hr (08/15/18 0631)  . heparin Stopped (08/14/18 1552)  . heparin 1,300 Units/hr (08/15/18 0631)   PRN Meds: sodium chloride, acetaminophen, morphine injection, ondansetron (ZOFRAN) IV, sodium chloride flush, sodium chloride flush   Vital Signs    Vitals:   08/14/18 1652 08/14/18 2025 08/14/18 2339 08/15/18 0347  BP: (!) 166/87 122/65 119/67 125/65  Pulse: 80 77  72  Resp: 18 20 19 16   Temp:  97.9 F (36.6 C) 98.1 F (36.7 C) 97.7 F (36.5 C)  TempSrc:  Oral Oral Oral  SpO2: 97%   98%  Weight:      Height:        Intake/Output Summary (Last 24 hours) at 08/15/2018 0814 Last data filed at 08/15/2018 0631 Gross per 24 hour  Intake 1022.23 ml  Output -  Net 1022.23 ml   Filed Weights   08/11/18 1602 08/12/18 0002 08/14/18 0300  Weight: 84.8 kg 83.6 kg 83.6 kg    Telemetry    Atrial flutter with variable response- Personally Reviewed  ECG    Electrocardiogram performed on 08/13/2018 was personally reviewed and reveals interventricular  conduction delay/left bundle branch block with atrial flutter and variable AV conduction.- Personally Reviewed  Physical Exam  Compensated/comfortable. GEN: No acute distress.   Neck: No JVD Cardiac: RRR, no murmurs, rubs, or gallops.  Left radial vascular access site with hematoma above the access site, small and and not tight. Respiratory: Clear to auscultation bilaterally. GI: Soft, nontender, non-distended  MS: No edema; No deformity. Neuro:  Nonfocal  Psych: Normal affect   Labs    Chemistry Recent Labs  Lab 08/11/18 1617 08/13/18 0328 08/14/18 0358  NA 139 138 137  K 4.2 4.2 4.8  CL 103 104 103  CO2 26 27 22   GLUCOSE 127* 145* 139*  BUN 9 10 13   CREATININE 1.20 1.33* 1.44*  CALCIUM 9.8 9.1 8.9  GFRNONAA 55* 49* 44*  GFRAA >60 56* 51*  ANIONGAP 10 7 12      Hematology Recent Labs  Lab 08/13/18 0328 08/14/18 0358 08/15/18 0239  WBC 6.7 6.7 7.5  RBC 4.67 4.56 4.42  HGB 15.2 14.9 14.5  HCT 48.5 47.3 45.9  MCV 103.9* 103.7* 103.8*  MCH 32.5 32.7 32.8  MCHC 31.3 31.5 31.6  RDW 12.8 12.6 12.8  PLT 179 173 169    Cardiac Enzymes Recent Labs  Lab  08/12/18 0703 08/14/18 0358 08/14/18 0622 08/14/18 0949  TROPONINI <0.03 <0.03 <0.03 <0.03    Recent Labs  Lab 08/11/18 1637  TROPIPOC 0.00     BNPNo results for input(s): BNP, PROBNP in the last 168 hours.   DDimer  Recent Labs  Lab 08/11/18 1736  DDIMER <0.27     Radiology    No results found.  Cardiac Studies   Cardiac catheterization 08/14/2018: Diagnostic  Dominance: Right     Patient Profile     84 y.o. male with a hx of hypertension, hypothyroidism,paroxysmalatrialflutteron Coumadin, history of mechanical aortic valve replacementrequiring Coumadin,and history of CADs/pCABG in 2000whopresented with chest pain and weakness and was found to be in recurrent atrial flutter with poor rate control.  Coronaries are clean on repeat cath.  Electrical cardioversion planned  08/15/2018.  Assessment & Plan    1. Atrial flutter -continues and plan is for electrical cardioversion later today.  After cardioversion will convert to p.o. amiodarone. 2. Mechanical aortic valve  -Coumadin resumed last evening after cath.  IV heparin is continuing. 3. CAD/CABG: Grafts are widely patent. 4. Anticoagulation: Now on Coumadin which was resumed after cath.  Back on IV heparin.  Anticipating electrical cardioversion today. 5. Will convert to oral amiodarone after cardioversion today.  Probably 200 mg twice daily for 2 weeks then 200 mg daily thereafter.  Hoping for successful electrical cardioversion today.  Discharge when INR is approaching 2.0.  This will likely be at some point over the weekend.     For questions or updates, please contact [place] Please consult [email or web address] for contact info under Cardiology/STEMI.      Signed, Sinclair Grooms, MD  08/15/2018, 8:14 AM

## 2018-08-15 NOTE — Anesthesia Preprocedure Evaluation (Addendum)
Anesthesia Evaluation  Patient identified by MRN, date of birth, ID band Patient awake    Reviewed: Allergy & Precautions, NPO status , Patient's Chart, lab work & pertinent test results  History of Anesthesia Complications Negative for: history of anesthetic complications  Airway Mallampati: III  TM Distance: >3 FB Neck ROM: Full    Dental  (+) Dental Advisory Given, Partial Upper, Partial Lower   Pulmonary former smoker,    breath sounds clear to auscultation       Cardiovascular hypertension, Pt. on home beta blockers and Pt. on medications + angina at rest + CAD and + CABG  + dysrhythmias Atrial Fibrillation + Valvular Problems/Murmurs  Rhythm:Irregular Rate:Normal + Systolic murmurs  S/p mechanical AVR  '19 Coronary angio - Mid RCA lesion is 100% stenosed. Ost 2nd Mrg lesion is 100% stenosed. Prox LAD to Mid LAD lesion is 50% stenosed. SVG. The graft exhibits mild .  1.  Severe two-vessel coronary artery disease with total occlusion of the OM 2 and the mid RCA, nonobstructive LAD stenosis 2.  Status post remote aortocoronary bypass surgery with continued patency of the LIMA to LAD, saphenous vein graft OM 2, and saphenous vein graft to PDA  '19 TTE - Mild concentric hypertrophy. EF 55% to 60%. Ventricular septal motion showed paradox. These changes are consistent with a post-thoracotomy state.A mechanical AV prosthesis was present and functioning   Normally. Mild MR, mildly dilated LA. RV systolic function was mildly to moderately reduced. RA was mildly dilated.    Neuro/Psych negative neurological ROS  negative psych ROS   GI/Hepatic negative GI ROS, Neg liver ROS,   Endo/Other  Hypothyroidism   Renal/GU negative Renal ROS     Musculoskeletal negative musculoskeletal ROS (+)   Abdominal   Peds  Hematology negative hematology ROS (+)   Anesthesia Other Findings   Reproductive/Obstetrics                             Anesthesia Physical Anesthesia Plan  ASA: III  Anesthesia Plan: General   Post-op Pain Management:    Induction: Intravenous  PONV Risk Score and Plan: 2 and Treatment may vary due to age or medical condition and Propofol infusion  Airway Management Planned: Mask and Natural Airway  Additional Equipment: None  Intra-op Plan:   Post-operative Plan:   Informed Consent: I have reviewed the patients History and Physical, chart, labs and discussed the procedure including the risks, benefits and alternatives for the proposed anesthesia with the patient or authorized representative who has indicated his/her understanding and acceptance.     Plan Discussed with: CRNA and Anesthesiologist  Anesthesia Plan Comments:        Anesthesia Quick Evaluation

## 2018-08-15 NOTE — Progress Notes (Signed)
[place] for heparin (hold warfarin) Indication: Atrial Fibrillation + mechanical AVR  Patient Measurements: Height: 5\' 10"  (177.8 cm) Weight: 184 lb 4.9 oz (83.6 kg) IBW/kg (Calculated) : 73   Vital Signs: Temp: 97.9 F (36.6 C) (12/13 1930) Temp Source: Oral (12/13 1930) BP: 111/46 (12/13 1930) Pulse Rate: 65 (12/13 1930)  Labs: Recent Labs    08/13/18 0328 08/13/18 1253  08/14/18 0358 08/14/18 0622 08/14/18 0949 08/15/18 0239 08/15/18 1047 08/15/18 2009  HGB 15.2  --   --  14.9  --   --  14.5  --   --   HCT 48.5  --   --  47.3  --   --  45.9  --   --   PLT 179  --   --  173  --   --  169  --   --   LABPROT 24.7* 21.5*  --  19.7*  --   --  16.9*  --   --   INR 2.27 1.89  --  1.69  --   --  1.38  --   --   HEPARINUNFRC  --   --    < >  --  0.50  --   --  0.72* 0.79*  CREATININE 1.33*  --   --  1.44*  --   --   --   --   --   TROPONINI  --   --   --  <0.03 <0.03 <0.03  --   --   --    < > = values in this interval not displayed.     Medical History: Past Medical History:  Diagnosis Date  . Atrial fibrillation ([place])   . CAD (coronary artery disease) of artery bypass graft 2000   2000 with multiple bypasses   . Chronic anticoagulation    Coumadin therapy for mechanical aortic valve. Postoperative atrial fibrillation.   . Essential hypertension   . H/O mechanical aortic valve replacement 2000   St Jude AVR  . Hypothyroidism   . Premature ventricular contractions     Assessment: 84 yo male with AFib and mechanical AVR (INR goal 2.5-3.0) admitted with recurrent AFib RVR and CP. He is now post cath to restart heparin and coumadin until INR therapeutic. Underwent successful cardioversion on 12/13.  -heparin level= 0.79 after decrease to 1250 units/hr   PTA warfarin: 5 mg on Mon/Wed/Fri, 7.5 mg on all other days  Goal of Therapy:  Heparin level 0.3-0.7 units/ml Monitor platelets by anticoagulation protocol: Yes   Plan:    -Reduce heparin infusion to 1100 units/hr  -Heparin level in 8 hours and daily wth CBC daily  Hildred Laser, PharmD Clinical Pharmacist **Pharmacist phone directory can now be found on [email or web address] ([email or web address]).  Listed under [place].

## 2018-08-15 NOTE — Interval H&P Note (Signed)
History and Physical Interval Note:  08/15/2018 9:47 AM  Jason Santana  has presented today for surgery, with the diagnosis of AFIB  The various methods of treatment have been discussed with the patient and family. After consideration of risks, benefits and other options for treatment, the patient has consented to  Procedure(s): CARDIOVERSION (N/A) as a surgical intervention .  The patient's history has been reviewed, patient examined, no change in status, stable for surgery.  I have reviewed the patient's chart and labs.  Questions were answered to the patient's satisfaction.     Mertie Moores

## 2018-08-16 ENCOUNTER — Encounter (HOSPITAL_COMMUNITY): Payer: Self-pay | Admitting: Cardiovascular Disease

## 2018-08-16 LAB — CBC
HEMATOCRIT: 44.5 % (ref 39.0–52.0)
HEMOGLOBIN: 13.9 g/dL (ref 13.0–17.0)
MCH: 33.1 pg (ref 26.0–34.0)
MCHC: 31.2 g/dL (ref 30.0–36.0)
MCV: 106 fL — ABNORMAL HIGH (ref 80.0–100.0)
Platelets: 145 10*3/uL — ABNORMAL LOW (ref 150–400)
RBC: 4.2 MIL/uL — ABNORMAL LOW (ref 4.22–5.81)
RDW: 12.9 % (ref 11.5–15.5)
WBC: 5.8 10*3/uL (ref 4.0–10.5)
nRBC: 0 % (ref 0.0–0.2)

## 2018-08-16 LAB — PROTIME-INR
INR: 1.32
Prothrombin Time: 16.3 seconds — ABNORMAL HIGH (ref 11.4–15.2)

## 2018-08-16 LAB — HEPARIN LEVEL (UNFRACTIONATED): Heparin Unfractionated: 0.84 IU/mL — ABNORMAL HIGH (ref 0.30–0.70)

## 2018-08-16 MED ORDER — WARFARIN SODIUM 10 MG PO TABS
10.0000 mg | ORAL_TABLET | Freq: Once | ORAL | Status: AC
  Administered 2018-08-16: 10 mg via ORAL
  Filled 2018-08-16: qty 1

## 2018-08-16 NOTE — Progress Notes (Signed)
Progress Note  Patient Name: Jason Santana Date of Encounter: 08/16/2018  Primary Cardiologist: Sinclair Grooms, MD   Subjective   Sitting in chair, eating lunch.  No chest pain, no shortness of breath.  Inpatient Medications    Scheduled Meds: . amiodarone  200 mg Oral BID  . finasteride  5 mg Oral QHS  . levothyroxine  75 mcg Oral QAC breakfast  . metoprolol tartrate  50 mg Oral BID  . multivitamin  1 tablet Oral Daily  . pantoprazole  40 mg Oral BID  . rosuvastatin  5 mg Oral Daily  . sodium chloride flush  3 mL Intravenous Q12H  . sodium chloride flush  3 mL Intravenous Q12H  . tamsulosin  0.4 mg Oral QHS  . Warfarin - Pharmacist Dosing Inpatient   Does not apply q1800   Continuous Infusions: . sodium chloride 1 mL/hr at 08/14/18 1437  . sodium chloride    . heparin 1,100 Units/hr (08/15/18 2122)   PRN Meds: sodium chloride, acetaminophen, morphine injection, ondansetron (ZOFRAN) IV, sodium chloride flush, sodium chloride flush   Vital Signs    Vitals:   08/16/18 0336 08/16/18 0817 08/16/18 0830 08/16/18 1138  BP: (!) 103/58 137/74  (!) 152/73  Pulse: (!) 56 (!) 57  (!) 53  Resp: 19 17 20 20   Temp: 97.9 F (36.6 C) 97.9 F (36.6 C)  97.7 F (36.5 C)  TempSrc: Oral Tympanic  Oral  SpO2: 98%  98%   Weight:      Height:        Intake/Output Summary (Last 24 hours) at 08/16/2018 1214 Last data filed at 08/16/2018 1141 Gross per 24 hour  Intake 1128.78 ml  Output 0 ml  Net 1128.78 ml   Filed Weights   08/12/18 0002 08/14/18 0300 08/15/18 0854  Weight: 83.6 kg 83.6 kg 83.6 kg    Telemetry    Sinus rhythm no other abnormalities- Personally Reviewed  ECG    Sinus rhythm with left anterior fascicular block- Personally Reviewed  Physical Exam   GEN: No acute distress.   Neck: No JVD Cardiac: RRR, sharp S2 click, no murmurs, rubs, or gallops.  Respiratory: Clear to auscultation bilaterally. GI: Soft, nontender, non-distended  MS: No edema;  No deformity. Neuro:  Nonfocal  Psych: Normal affect   Labs    Chemistry Recent Labs  Lab 08/11/18 1617 08/13/18 0328 08/14/18 0358  NA 139 138 137  K 4.2 4.2 4.8  CL 103 104 103  CO2 26 27 22   GLUCOSE 127* 145* 139*  BUN 9 10 13   CREATININE 1.20 1.33* 1.44*  CALCIUM 9.8 9.1 8.9  GFRNONAA 55* 49* 44*  GFRAA >60 56* 51*  ANIONGAP 10 7 12      Hematology Recent Labs  Lab 08/14/18 0358 08/15/18 0239 08/16/18 0755  WBC 6.7 7.5 5.8  RBC 4.56 4.42 4.20*  HGB 14.9 14.5 13.9  HCT 47.3 45.9 44.5  MCV 103.7* 103.8* 106.0*  MCH 32.7 32.8 33.1  MCHC 31.5 31.6 31.2  RDW 12.6 12.8 12.9  PLT 173 169 145*    Cardiac Enzymes Recent Labs  Lab 08/12/18 0703 08/14/18 0358 08/14/18 0622 08/14/18 0949  TROPONINI <0.03 <0.03 <0.03 <0.03    Recent Labs  Lab 08/11/18 1637  TROPIPOC 0.00     BNPNo results for input(s): BNP, PROBNP in the last 168 hours.   DDimer  Recent Labs  Lab 08/11/18 1736  DDIMER <0.27     Radiology  No results found.  Cardiac Studies   Widely patent grafts on cardiac catheterization  Patient Profile     84 y.o. male with paroxysmal atrial flutter on Coumadin mechanical aortic valve coronary artery disease status post CABG 2000 with chest pain weakness.  Cardioversion successful 08/15/2018.  Widely patent grafts on cardiac catheterization 08/14/2018.  Assessment & Plan    Atrial flutter - Successful cardioversion 08/15/2018. - P.o. amiodarone.  200 mg twice a day for 2 weeks then 200 mg thereafter daily  Mechanical aortic valve - Coumadin, IV heparin.  Bridge.  INR 2-3 goal.  INR slightly reduced at 1.32 today from 1.38 yesterday.  Dosing per pharmacy.  Continue with IV heparin.  Coronary artery disease status post CABG 2000 - Grafts are widely patent on catheterization this admission.  Medical management.  Baseline creatinine 1.3, 5 months ago.  Currently ranging from 1.2-1.4  Plan will be to discharge home when INR approaches  2.  No further cardiac recommendations at this time.  Please call  if any further questions.  [facility] will sign off.   Medication Recommendations: Amiodarone 200 mg twice a day for 2 weeks then 200 mg once a day thereafter Other recommendations (labs, testing, etc): Continue to monitor amiodarone labs, TSH liver function as outpatient Follow up as an outpatient: We will schedule a follow-up appointment.  For questions or updates, please contact [place] Please consult [email or web address] for contact info under        Signed, Candee Furbish, MD  08/16/2018, 12:14 PM

## 2018-08-16 NOTE — Plan of Care (Signed)

## 2018-08-16 NOTE — Progress Notes (Signed)
PROGRESS NOTE    Jason Santana  TMH:[id number] DOB: September 13, 1931 DOA: 08/11/2018 PCP: [name], MD   Brief Narrative:  84 year old with past medical history of mitral valve replacement on Coumadin, essential hypertension, atrial fibrillation, CAD status post CABG, hypothyroidism came to the hospital with complaints of chest pain.  Patient states he mostly get this chest pain during exertion.  He was also noted to be in atrial fibrillation with RVR.  Cardiology following the patient.  Plans for left heart catheterization 12/12 which showed previously all occluded vessels and 50% LAD stenosis.  Advised medical management at this time and resuming Coumadin.  Cardioversion performed on 12/13 was successful.   Assessment & Plan:   Active Problems:   Paroxysmal atrial flutter (HCC)   Essential hypertension   H/O mechanical aortic valve replacement   CAD (coronary artery disease) of artery bypass graft   Chronic anticoagulation   Hypothyroidism   Coronary artery disease involving native coronary artery of native heart with unstable angina pectoris (HCC)   Atypical atrial flutter (HCC)   Chest pain   Atypical chest pain Coronary artery disease status post CABG -This is concerning given his previous history.  Cardiac enzymes remain negative at this time.  2D echo showed preserved ejection fraction without any obvious wall motion abnormality.  Currently he remains chest pain-free. -Left heart catheterization 12/12- previously occluded RCA, OM 2.  LAD is 50%.  Advised medical management -Cardioversion performed 12/13-successful  Atrial fibrillation/flutter with RVR, status post successful cardioversion 12/13 -Metoprolol 50 mg twice daily.  We will bridge heparin to Coumadin with goal INR around 2.0. - Patient will get amiodarone 200 mg twice daily for 2 weeks followed by 200 mg daily. Follow-up arrangements will be made by the cardiology service.  Essential hypertension -Continue  metoprolol.  Hypothyroidism -continue Synthroid  History of mechanical valve replacement -Heparin bridge to Coumadin  GERD -Continue Protonix 40 mg daily  BPH -Continue Flomax and finasteride   DVT prophylaxis: Heparin drip Code Status:  partial code Family Communication: Wife at bedside Disposition Plan: Maintain hospitalization on IV heparin until INR is approaching therapeutic range of 2.0.  Hopefully this will happen over the next 24-48 hours.  Consultants:   Cardiology  Procedures:   Left heart catheterization 12/12  Cardioversion 12/13  Antimicrobials:   None   Subjective: Patient is very cheerful this morning and does not have any new complaints.  No acute events overnight.  Review of Systems Otherwise negative except as per HPI, including: General = no fevers, chills, dizziness, malaise, fatigue HEENT/EYES = negative for pain, redness, loss of vision, double vision, blurred vision, loss of hearing, sore throat, hoarseness, dysphagia Cardiovascular= negative for chest pain, palpitation, murmurs, lower extremity swelling Respiratory/lungs= negative for shortness of breath, cough, hemoptysis, wheezing, mucus production Gastrointestinal= negative for nausea, vomiting,, abdominal pain, melena, hematemesis Genitourinary= negative for Dysuria, Hematuria, Change in Urinary Frequency MSK = Negative for arthralgia, myalgias, Back Pain, Joint swelling  Neurology= Negative for headache, seizures, numbness, tingling  Psychiatry= Negative for anxiety, depression, suicidal and homocidal ideation Allergy/Immunology= Medication/Food allergy as listed  Skin= Negative for Rash, lesions, ulcers, itching  Objective: Vitals:   08/16/18 0336 08/16/18 0817 08/16/18 0830 08/16/18 1138  BP: (!) 103/58 137/74  (!) 152/73  Pulse: (!) 56 (!) 57  (!) 53  Resp: 19 17 20 20   Temp: 97.9 F (36.6 C) 97.9 F (36.6 C)  97.7 F (36.5 C)  TempSrc: Oral Tympanic  Oral  SpO2: 98%  98%  Weight:      Height:        Intake/Output Summary (Last 24 hours) at 08/16/2018 1238 Last data filed at 08/16/2018 1141 Gross per 24 hour  Intake 1128.78 ml  Output 0 ml  Net 1128.78 ml   Filed Weights   08/12/18 0002 08/14/18 0300 08/15/18 0854  Weight: 83.6 kg 83.6 kg 83.6 kg    Examination: Constitutional: NAD, calm, comfortable Eyes: PERRL, lids and conjunctivae normal ENMT: Mucous membranes are moist. Posterior pharynx clear of any exudate or lesions.Normal dentition.  Neck: normal, supple, no masses, no thyromegaly Respiratory: clear to auscultation bilaterally, no wheezing, no crackles. Normal respiratory effort. No accessory muscle use.  Cardiovascular: Regular rate and rhythm, no murmurs / rubs / gallops. No extremity edema. 2+ pedal pulses. No carotid bruits.  Abdomen: no tenderness, no masses palpated. No hepatosplenomegaly. Bowel sounds positive.  Musculoskeletal: no clubbing / cyanosis. No joint deformity upper and lower extremities. Good ROM, no contractures. Normal muscle tone.  Skin: no rashes, lesions, ulcers. No induration Neurologic: CN 2-12 grossly intact. Sensation intact, DTR normal. Strength 5/5 in all 4.  Psychiatric: Normal judgment and insight. Alert and oriented x 3. Normal mood.   Data Reviewed:   CBC: Recent Labs  Lab 08/11/18 1617 08/13/18 0328 08/14/18 0358 08/15/18 0239 08/16/18 0755  WBC 8.3 6.7 6.7 7.5 5.8  HGB 16.5 15.2 14.9 14.5 13.9  HCT 53.4* 48.5 47.3 45.9 44.5  MCV 106.4* 103.9* 103.7* 103.8* 106.0*  PLT 206 179 173 169 676*   Basic Metabolic Panel: Recent Labs  Lab 08/11/18 1617 08/13/18 0328 08/14/18 0358  NA 139 138 137  K 4.2 4.2 4.8  CL 103 104 103  CO2 26 27 22   GLUCOSE 127* 145* 139*  BUN 9 10 13   CREATININE 1.20 1.33* 1.44*  CALCIUM 9.8 9.1 8.9   GFR: Estimated Creatinine Clearance: 39.4 mL/min (A) (by C-G formula based on SCr of 1.44 mg/dL (H)). Liver Function Tests: No results for input(s): AST, ALT,  ALKPHOS, BILITOT, PROT, ALBUMIN in the last 168 hours. No results for input(s): LIPASE, AMYLASE in the last 168 hours. No results for input(s): AMMONIA in the last 168 hours. Coagulation Profile: Recent Labs  Lab 08/13/18 0328 08/13/18 1253 08/14/18 0358 08/15/18 0239 08/16/18 0755  INR 2.27 1.89 1.69 1.38 1.32   Cardiac Enzymes: Recent Labs  Lab 08/12/18 0225 08/12/18 0703 08/14/18 0358 08/14/18 0622 08/14/18 0949  TROPONINI <0.03 <0.03 <0.03 <0.03 <0.03   BNP (last 3 results) No results for input(s): PROBNP in the last 8760 hours. HbA1C: No results for input(s): HGBA1C in the last 72 hours. CBG: No results for input(s): GLUCAP in the last 168 hours. Lipid Profile: No results for input(s): CHOL, HDL, LDLCALC, TRIG, CHOLHDL, LDLDIRECT in the last 72 hours. Thyroid Function Tests: No results for input(s): TSH, T4TOTAL, FREET4, T3FREE, THYROIDAB in the last 72 hours. Anemia Panel: No results for input(s): VITAMINB12, FOLATE, FERRITIN, TIBC, IRON, RETICCTPCT in the last 72 hours. Sepsis Labs: No results for input(s): PROCALCITON, LATICACIDVEN in the last 168 hours.  Recent Results (from the past 240 hour(s))  MRSA PCR Screening     Status: None   Collection Time: 08/12/18 12:59 AM  Result Value Ref Range Status   MRSA by PCR NEGATIVE NEGATIVE Final    Comment:        The GeneXpert MRSA Assay (FDA approved for NASAL specimens only), is one component of a comprehensive MRSA colonization surveillance program. It is not intended to diagnose  MRSA infection nor to guide or monitor treatment for MRSA infections. Performed at [facility], [place] [address] [place], [place] [address]          Radiology Studies: No results found.      Scheduled Meds: . amiodarone  200 mg Oral BID  . finasteride  5 mg Oral QHS  . levothyroxine  75 mcg Oral QAC breakfast  . metoprolol tartrate  50 mg Oral BID  . multivitamin  1 tablet Oral Daily  . pantoprazole  40 mg  Oral BID  . rosuvastatin  5 mg Oral Daily  . sodium chloride flush  3 mL Intravenous Q12H  . sodium chloride flush  3 mL Intravenous Q12H  . tamsulosin  0.4 mg Oral QHS  . Warfarin - Pharmacist Dosing Inpatient   Does not apply q1800   Continuous Infusions: . sodium chloride 1 mL/hr at 08/14/18 1437  . sodium chloride    . heparin 1,100 Units/hr (08/15/18 2122)     LOS: 4 days   Time spent= 25 mins    Niclas Markell Arsenio Loader, MD [facility] Pager [phone]   If 7PM-7AM, please contact night-coverage [email or web address] Password [facility] 08/16/2018, 12:38 PM

## 2018-08-16 NOTE — Progress Notes (Signed)
[place] for heparin and warfarin Indication: Atrial Fibrillation + mechanical AVR  Patient Measurements: Height: 5\' 10"  (177.8 cm) Weight: 184 lb 4.9 oz (83.6 kg) IBW/kg (Calculated) : 73   Vital Signs: Temp: 97.7 F (36.5 C) (12/14 1138) Temp Source: Oral (12/14 1138) BP: 152/73 (12/14 1138) Pulse Rate: 53 (12/14 1138)  Labs: Recent Labs    08/14/18 0358 08/14/18 0622 08/14/18 0949 08/15/18 0239 08/15/18 1047 08/15/18 2009 08/16/18 0755  HGB 14.9  --   --  14.5  --   --  13.9  HCT 47.3  --   --  45.9  --   --  44.5  PLT 173  --   --  169  --   --  145*  LABPROT 19.7*  --   --  16.9*  --   --  16.3*  INR 1.69  --   --  1.38  --   --  1.32  HEPARINUNFRC  --  0.50  --   --  0.72* 0.79* 0.84*  CREATININE 1.44*  --   --   --   --   --   --   TROPONINI <0.03 <0.03 <0.03  --   --   --   --      Medical History: Past Medical History:  Diagnosis Date  . Atrial fibrillation ([place])   . CAD (coronary artery disease) of artery bypass graft 2000   2000 with multiple bypasses   . Chronic anticoagulation    Coumadin therapy for mechanical aortic valve. Postoperative atrial fibrillation.   . Essential hypertension   . H/O mechanical aortic valve replacement 2000   [employer] Jude AVR  . Hypothyroidism   . Premature ventricular contractions     Assessment: 84 yo male with AFib and mechanical AVR (INR goal 2.5-3.0) admitted with recurrent AFib RVR and CP. He is now post cath to restart heparin and coumadin until INR therapeutic.   Underwent successful cardioversion on 12/13. INR 1.32 as expected after holding, heparin level remains elevated, CBC stable.  PTA warfarin: 5 mg on Mon/Wed/Fri, 7.5 mg on all other days  Goal of Therapy:  Heparin level 0.3-0.7 units/ml Monitor platelets by anticoagulation protocol: Yes   Plan:  -Reduce heparin infusion to 1000 units/hr - reheck heparin level in am -Coumadin 10mg  po tonight  -Daily  PT/INR  Arrie Senate, PharmD, BCPS Clinical Pharmacist [phone] Please check AMION for all [place] numbers 08/16/2018

## 2018-08-17 DIAGNOSIS — M79602 Pain in left arm: Secondary | ICD-10-CM

## 2018-08-17 DIAGNOSIS — M7989 Other specified soft tissue disorders: Secondary | ICD-10-CM

## 2018-08-17 LAB — CBC
HCT: 40.7 % (ref 39.0–52.0)
HEMOGLOBIN: 12.6 g/dL — AB (ref 13.0–17.0)
MCH: 33 pg (ref 26.0–34.0)
MCHC: 31 g/dL (ref 30.0–36.0)
MCV: 106.5 fL — ABNORMAL HIGH (ref 80.0–100.0)
Platelets: 142 10*3/uL — ABNORMAL LOW (ref 150–400)
RBC: 3.82 MIL/uL — ABNORMAL LOW (ref 4.22–5.81)
RDW: 12.9 % (ref 11.5–15.5)
WBC: 7 10*3/uL (ref 4.0–10.5)
nRBC: 0 % (ref 0.0–0.2)

## 2018-08-17 LAB — BASIC METABOLIC PANEL
Anion gap: 11 (ref 5–15)
BUN: 14 mg/dL (ref 8–23)
CO2: 24 mmol/L (ref 22–32)
Calcium: 9 mg/dL (ref 8.9–10.3)
Chloride: 104 mmol/L (ref 98–111)
Creatinine, Ser: 1.31 mg/dL — ABNORMAL HIGH (ref 0.61–1.24)
GFR calc non Af Amer: 50 mL/min — ABNORMAL LOW (ref >60)
GFR, EST AFRICAN AMERICAN: 58 mL/min — AB (ref >60)
Glucose, Bld: 129 mg/dL — ABNORMAL HIGH (ref 70–99)
Potassium: 4 mmol/L (ref 3.5–5.1)
Sodium: 139 mmol/L (ref 135–145)

## 2018-08-17 LAB — MAGNESIUM: MAGNESIUM: 1.9 mg/dL (ref 1.7–2.4)

## 2018-08-17 LAB — PROTIME-INR
INR: 1.61
Prothrombin Time: 19 seconds — ABNORMAL HIGH (ref 11.4–15.2)

## 2018-08-17 LAB — HEPARIN LEVEL (UNFRACTIONATED): Heparin Unfractionated: 0.62 IU/mL (ref 0.30–0.70)

## 2018-08-17 MED ORDER — WARFARIN SODIUM 7.5 MG PO TABS
7.5000 mg | ORAL_TABLET | Freq: Once | ORAL | Status: AC
  Administered 2018-08-17: 7.5 mg via ORAL
  Filled 2018-08-17: qty 1

## 2018-08-17 NOTE — Significant Event (Signed)
Called to bedside this morning to evaluate swelling of the patient's left forearm. Patient had a cath from the left radial approach on this past Thursday and has been on warfarin with a heparin bridge following cardioversion on 12/13 and for mechanical aortic valve. On exam, the patient has a firm hematoma extending from his hand to his upper forearm, ecchymoses, and severe tenderness of the hand and arm. The distal radial pulse is in tact and good capillary refill. Coban dressing applied to the forearm and hand and arm elevated. Discussed case with Dr. Burt Knack, decision made to hold heparin at this time until the day team could evaluate this morning.

## 2018-08-17 NOTE — Progress Notes (Signed)
PROGRESS NOTE    Jason Santana  SJG:[id number] DOB: 11/19/1931 DOA: 08/11/2018 PCP: [name], MD   Brief Narrative:  84 year old with past medical history of mitral valve replacement on Coumadin, essential hypertension, atrial fibrillation, CAD status post CABG, hypothyroidism came to the hospital with complaints of chest pain.  Patient states he mostly get this chest pain during exertion.  He was also noted to be in atrial fibrillation with RVR.  Cardiology following the patient.  Plans for left heart catheterization 12/12 which showed previously all occluded vessels and 50% LAD stenosis.  Advised medical management at this time and resuming Coumadin.  Cardioversion performed on 12/13 was successful.   Assessment & Plan:   Active Problems:   Paroxysmal atrial flutter (HCC)   Essential hypertension   H/O mechanical aortic valve replacement   CAD (coronary artery disease) of artery bypass graft   Chronic anticoagulation   Hypothyroidism   Coronary artery disease involving native coronary artery of native heart with unstable angina pectoris (HCC)   Atypical atrial flutter (HCC)   Chest pain  Left upper extremity swelling and pain with concerns of hematoma - This appears to be side around where he had a left heart catheterization, left radial approach. - Currently bandages in place, heparin drip turned off.  We will continue Coumadin with caution. -He has good circulation and sensation at this time.  No obvious evidence of compartment syndrome but will closely monitor this.. -Advised to elevate his arm.  Coronary artery disease status post CABG -This is concerning given his previous history.  Cardiac enzymes remain negative at this time.  2D echo showed preserved ejection fraction without any obvious wall motion abnormality.  Currently he remains chest pain-free. -Left heart catheterization 12/12- previously occluded RCA, OM 2.  LAD is 50%.  Advised medical management -Cardioversion  performed 12/13-successful  Atrial fibrillation/flutter with RVR, status post successful cardioversion 12/13 -Metoprolol 50 mg twice daily.  Heparin drip turned off due to left upper extremity swelling.  We will continue Coumadin.  INR is 1.61.  We will continue this for now. - Patient will get amiodarone 200 mg twice daily for 2 weeks followed by 200 mg daily. -Appreciate input from cardiology service  Essential hypertension -Continue metoprolol.  Hypothyroidism -continue Synthroid  History of mechanical valve replacement -Heparin bridge to Coumadin  GERD -Continue Protonix 40 mg daily  BPH -Continue Flomax and finasteride   DVT prophylaxis: Heparin drip Code Status:  partial code Family Communication: Wife at bedside Disposition Plan: Maintain hospitalization for monitoring for his left upper extremity in the meantime awaiting INR to approach 2.0.  Hopefully will be doing better by tomorrow so we can discharge him.  Consultants:   Cardiology  Procedures:   Left heart catheterization 12/12  Cardioversion 12/13  Antimicrobials:   None   Subjective: Overnight patient had swelling and pain in his left upper extremity.  He was evaluated by on-call overnight cardiology and due to concerns for hematoma his heparin drip was turned off.  This morning his pain is better controlled and denies any numbness and tingling.  Appears to have good perfusion and sensation.  Review of Systems Otherwise negative except as per HPI, including: General = no fevers, chills, dizziness, malaise, fatigue HEENT/EYES = negative for pain, redness, loss of vision, double vision, blurred vision, loss of hearing, sore throat, hoarseness, dysphagia Cardiovascular= negative for chest pain, palpitation, murmurs, lower extremity swelling Respiratory/lungs= negative for shortness of breath, cough, hemoptysis, wheezing, mucus production Gastrointestinal= negative for  nausea, vomiting,, abdominal pain,  melena, hematemesis Genitourinary= negative for Dysuria, Hematuria, Change in Urinary Frequency MSK = left upper extremity pain and swelling Neurology= Negative for headache, seizures, numbness, tingling  Psychiatry= Negative for anxiety, depression, suicidal and homocidal ideation Allergy/Immunology= Medication/Food allergy as listed  Skin= Negative for Rash, lesions, ulcers, itching   Objective: Vitals:   08/17/18 0232 08/17/18 0510 08/17/18 0800 08/17/18 1123  BP:  (!) 127/54 (!) 115/45 123/61  Pulse:   (!) 50 (!) 56  Resp: (!) 23  17 19   Temp:  97.9 F (36.6 C) (!) 97.3 F (36.3 C) 97.7 F (36.5 C)  TempSrc:  Oral Oral Oral  SpO2:   98%   Weight:      Height:        Intake/Output Summary (Last 24 hours) at 08/17/2018 1133 Last data filed at 08/17/2018 1100 Gross per 24 hour  Intake 1019.72 ml  Output 0 ml  Net 1019.72 ml   Filed Weights   08/12/18 0002 08/14/18 0300 08/15/18 0854  Weight: 83.6 kg 83.6 kg 83.6 kg    Examination: Constitutional: NAD, calm, comfortable Eyes: PERRL, lids and conjunctivae normal ENMT: Mucous membranes are moist. Posterior pharynx clear of any exudate or lesions.Normal dentition.  Neck: normal, supple, no masses, no thyromegaly Respiratory: clear to auscultation bilaterally, no wheezing, no crackles. Normal respiratory effort. No accessory muscle use.  Cardiovascular: Regular rate and rhythm, no murmurs / rubs / gallops. No extremity edema. 2+ pedal pulses. No carotid bruits.  Abdomen: no tenderness, no masses palpated. No hepatosplenomegaly. Bowel sounds positive.  Musculoskeletal: Left upper extremity pain and swelling.  Sensation is intact, good perfusion. Skin: no rashes, lesions, ulcers. No induration Neurologic: CN 2-12 grossly intact. Sensation intact, DTR normal. Strength 5/5 in all 4.  Psychiatric: Normal judgment and insight. Alert and oriented x 3. Normal mood.   Data Reviewed:   CBC: Recent Labs  Lab 08/13/18 0328  08/14/18 0358 08/15/18 0239 08/16/18 0755 08/17/18 0222  WBC 6.7 6.7 7.5 5.8 7.0  HGB 15.2 14.9 14.5 13.9 12.6*  HCT 48.5 47.3 45.9 44.5 40.7  MCV 103.9* 103.7* 103.8* 106.0* 106.5*  PLT 179 173 169 145* 188*   Basic Metabolic Panel: Recent Labs  Lab 08/11/18 1617 08/13/18 0328 08/14/18 0358 08/17/18 0222  NA 139 138 137 139  K 4.2 4.2 4.8 4.0  CL 103 104 103 104  CO2 26 27 22 24   GLUCOSE 127* 145* 139* 129*  BUN 9 10 13 14   CREATININE 1.20 1.33* 1.44* 1.31*  CALCIUM 9.8 9.1 8.9 9.0  MG  --   --   --  1.9   GFR: Estimated Creatinine Clearance: 43.3 mL/min (A) (by C-G formula based on SCr of 1.31 mg/dL (H)). Liver Function Tests: No results for input(s): AST, ALT, ALKPHOS, BILITOT, PROT, ALBUMIN in the last 168 hours. No results for input(s): LIPASE, AMYLASE in the last 168 hours. No results for input(s): AMMONIA in the last 168 hours. Coagulation Profile: Recent Labs  Lab 08/13/18 1253 08/14/18 0358 08/15/18 0239 08/16/18 0755 08/17/18 0222  INR 1.89 1.69 1.38 1.32 1.61   Cardiac Enzymes: Recent Labs  Lab 08/12/18 0225 08/12/18 0703 08/14/18 0358 08/14/18 0622 08/14/18 0949  TROPONINI <0.03 <0.03 <0.03 <0.03 <0.03   BNP (last 3 results) No results for input(s): PROBNP in the last 8760 hours. HbA1C: No results for input(s): HGBA1C in the last 72 hours. CBG: No results for input(s): GLUCAP in the last 168 hours. Lipid Profile: No results for  input(s): CHOL, HDL, LDLCALC, TRIG, CHOLHDL, LDLDIRECT in the last 72 hours. Thyroid Function Tests: No results for input(s): TSH, T4TOTAL, FREET4, T3FREE, THYROIDAB in the last 72 hours. Anemia Panel: No results for input(s): VITAMINB12, FOLATE, FERRITIN, TIBC, IRON, RETICCTPCT in the last 72 hours. Sepsis Labs: No results for input(s): PROCALCITON, LATICACIDVEN in the last 168 hours.  Recent Results (from the past 240 hour(s))  MRSA PCR Screening     Status: None   Collection Time: 08/12/18 12:59 AM  Result  Value Ref Range Status   MRSA by PCR NEGATIVE NEGATIVE Final    Comment:        The GeneXpert MRSA Assay (FDA approved for NASAL specimens only), is one component of a comprehensive MRSA colonization surveillance program. It is not intended to diagnose MRSA infection nor to guide or monitor treatment for MRSA infections. Performed at [facility], [place] [address], [place], [place] [address]          Radiology Studies: No results found.      Scheduled Meds: . amiodarone  200 mg Oral BID  . finasteride  5 mg Oral QHS  . levothyroxine  75 mcg Oral QAC breakfast  . metoprolol tartrate  50 mg Oral BID  . multivitamin  1 tablet Oral Daily  . pantoprazole  40 mg Oral BID  . rosuvastatin  5 mg Oral Daily  . sodium chloride flush  3 mL Intravenous Q12H  . sodium chloride flush  3 mL Intravenous Q12H  . tamsulosin  0.4 mg Oral QHS  . Warfarin - Pharmacist Dosing Inpatient   Does not apply q1800   Continuous Infusions: . sodium chloride 1 mL/hr at 08/14/18 1437  . sodium chloride    . heparin Stopped (08/17/18 6767)     LOS: 5 days   Time spent= 35 mins    Ankit Arsenio Loader, MD [facility] Pager [phone]   If 7PM-7AM, please contact night-coverage [email or web address] Password [facility] 08/17/2018, 11:33 AM

## 2018-08-17 NOTE — Progress Notes (Addendum)
Progress Note  Patient Name: Jason Santana Date of Encounter: 08/17/2018  Primary Cardiologist: Belva Crome III, MD   Subjective   Left radial catheterization site hematoma developed overnight.  Currently wrapped.  No further bleeding.  Heparin IV stopped.  INR currently 1.6.  Interesting because catheterization occurred on Thursday.  He is feeling well otherwise.  Left hand/digits are sensitive to extension but he is able to do this.  He has full sensation in his digits.  They are warm.  Slight venous congestion noted secondary to tight Ace wraps.  Inpatient Medications    Scheduled Meds: . amiodarone  200 mg Oral BID  . finasteride  5 mg Oral QHS  . levothyroxine  75 mcg Oral QAC breakfast  . metoprolol tartrate  50 mg Oral BID  . multivitamin  1 tablet Oral Daily  . pantoprazole  40 mg Oral BID  . rosuvastatin  5 mg Oral Daily  . sodium chloride flush  3 mL Intravenous Q12H  . sodium chloride flush  3 mL Intravenous Q12H  . tamsulosin  0.4 mg Oral QHS  . Warfarin - Pharmacist Dosing Inpatient   Does not apply q1800   Continuous Infusions: . sodium chloride 1 mL/hr at 08/14/18 1437  . sodium chloride    . heparin Stopped (08/17/18 7628)   PRN Meds: sodium chloride, acetaminophen, morphine injection, ondansetron (ZOFRAN) IV, sodium chloride flush, sodium chloride flush   Vital Signs    Vitals:   08/16/18 2357 08/17/18 0232 08/17/18 0510 08/17/18 0800  BP: 120/63  (!) 127/54 (!) 115/45  Pulse:    (!) 50  Resp:  (!) 23  17  Temp: (!) 97.4 F (36.3 C)  97.9 F (36.6 C) (!) 97.3 F (36.3 C)  TempSrc: Oral  Oral Oral  SpO2:    98%  Weight:      Height:        Intake/Output Summary (Last 24 hours) at 08/17/2018 1122 Last data filed at 08/17/2018 1000 Gross per 24 hour  Intake 1019.72 ml  Output 0 ml  Net 1019.72 ml   Filed Weights   08/12/18 0002 08/14/18 0300 08/15/18 0854  Weight: 83.6 kg 83.6 kg 83.6 kg    Telemetry    Normal sinus rhythm-  Personally Reviewed  ECG    No new, left anterior fascicular block- Personally Reviewed  Physical Exam   GEN: No acute distress.   Neck: No JVD Cardiac: RRR, no murmurs, rubs, or gallops.  Respiratory: Clear to auscultation bilaterally. GI: Soft, nontender, non-distended  MS: No edema; No deformity.  Left forearm Ace bandage wrapped.  Fingers are warm to the touch.  Full sensation. Neuro:  Nonfocal  Psych: Normal affect   Labs    Chemistry Recent Labs  Lab 08/13/18 0328 08/14/18 0358 08/17/18 0222  NA 138 137 139  K 4.2 4.8 4.0  CL 104 103 104  CO2 27 22 24   GLUCOSE 145* 139* 129*  BUN 10 13 14   CREATININE 1.33* 1.44* 1.31*  CALCIUM 9.1 8.9 9.0  GFRNONAA 49* 44* 50*  GFRAA 56* 51* 58*  ANIONGAP 7 12 11      Hematology Recent Labs  Lab 08/15/18 0239 08/16/18 0755 08/17/18 0222  WBC 7.5 5.8 7.0  RBC 4.42 4.20* 3.82*  HGB 14.5 13.9 12.6*  HCT 45.9 44.5 40.7  MCV 103.8* 106.0* 106.5*  MCH 32.8 33.1 33.0  MCHC 31.6 31.2 31.0  RDW 12.8 12.9 12.9  PLT 169 145* 142*  Cardiac Enzymes Recent Labs  Lab 08/12/18 0703 08/14/18 0358 08/14/18 0622 08/14/18 0949  TROPONINI <0.03 <0.03 <0.03 <0.03    Recent Labs  Lab 08/11/18 1637  TROPIPOC 0.00     BNPNo results for input(s): BNP, PROBNP in the last 168 hours.   DDimer  Recent Labs  Lab 08/11/18 1736  DDIMER <0.27     Radiology    No results found.  Cardiac Studies   Left radial approach cardiac cath.  Widely patent grafts.  Patient Profile     84 y.o. male CABG 2000 came in with chest pain mechanical aortic valve.  Widely patent grafts on cath from 08/14/2018.  Cardioversion also performed successfully on 12/13 for atrial flutter.  Assessment & Plan    Left radial site hematoma - No significant blood loss.  Confirmative pressure.  Currently wrapped.  No pain.  Moves all digits without problems.  Well perfused. - Since INR currently is 1.6, moving in the right direction toward 2 and he  has a mechanical aortic valve, I would feel comfortable holding off of IV heparin at this time.  Let us keep him here today to monitor for any signs of worsening bleeding however.  Obviously if his hand becomes compromised or worsens, we would have a low threshold for vascular surgery consultation to assess for potential compartment syndrome.  At this time continue with keeping arm above heart, raised, wraps in place. -As INR continues to trend upward, he should be able to be discharged soon Hgb 12.6 from 13.9  Mechanical aortic valve - Goal INR 2-3 -Warfarin.  Currently 1.6 from 1.32  CAD post CABG 2000 -Cath with widely patent grafts.  Chronic kidney disease stage III -Creatinine ranging from 1.2-1.4.    Atrial flutter - Continue with amiodarone.  Post cardioversion.  Sinus rhythm.  For questions or updates, please contact [place] Please consult [email or web address] for contact info under        Signed, Candee Furbish, MD  08/17/2018, 11:22 AM

## 2018-08-17 NOTE — Plan of Care (Signed)
  Problem: Education: Goal: Knowledge of General Education information will improve Description Including pain rating scale, medication(s)/side effects and non-pharmacologic comfort measures Outcome: Progressing   Problem: Health Behavior/Discharge Planning: Goal: Ability to manage health-related needs will improve Outcome: Progressing   Problem: Clinical Measurements: Goal: Ability to maintain clinical measurements within normal limits will improve Outcome: Progressing Goal: Will remain free from infection Outcome: Progressing Goal: Respiratory complications will improve Outcome: Progressing Goal: Cardiovascular complication will be avoided Outcome: Progressing   Problem: Activity: Goal: Risk for activity intolerance will decrease Outcome: Progressing   Problem: Nutrition: Goal: Adequate nutrition will be maintained Outcome: Progressing   Problem: Coping: Goal: Level of anxiety will decrease Outcome: Progressing   Problem: Elimination: Goal: Will not experience complications related to bowel motility Outcome: Progressing Goal: Will not experience complications related to urinary retention Outcome: Progressing   Problem: Pain Managment: Goal: General experience of comfort will improve Outcome: Progressing   Problem: Safety: Goal: Ability to remain free from injury will improve Outcome: Progressing   Problem: Skin Integrity: Goal: Risk for impaired skin integrity will decrease Outcome: Progressing   Problem: Clinical Measurements: Goal: Diagnostic test results will improve Outcome: Not Progressing Note:  INR subtherapeutic at 1.61 goal is 2-3. Lab draws in AM for monitoring.

## 2018-08-17 NOTE — Progress Notes (Signed)
Pt noted to have increased pain and swelling in LUE. Paged Cards MD on call to assess pt. Given orders to wrap pts arm and elevate extremity until further assessments complete. Orders completed. Will continue to monitor.

## 2018-08-17 NOTE — Plan of Care (Signed)

## 2018-08-17 NOTE — Progress Notes (Signed)
[place] for heparin and warfarin Indication: Atrial Fibrillation + mechanical AVR  Patient Measurements: Height: 5\' 10"  (177.8 cm) Weight: 184 lb 4.9 oz (83.6 kg) IBW/kg (Calculated) : 73   Vital Signs: Temp: 97.7 F (36.5 C) (12/15 1123) Temp Source: Oral (12/15 1123) BP: 123/61 (12/15 1123) Pulse Rate: 56 (12/15 1123)  Labs: Recent Labs    08/15/18 0239  08/15/18 2009 08/16/18 0755 08/17/18 0222  HGB 14.5  --   --  13.9 12.6*  HCT 45.9  --   --  44.5 40.7  PLT 169  --   --  145* 142*  LABPROT 16.9*  --   --  16.3* 19.0*  INR 1.38  --   --  1.32 1.61  HEPARINUNFRC  --    < > 0.79* 0.84* 0.62  CREATININE  --   --   --   --  1.31*   < > = values in this interval not displayed.     Medical History: Past Medical History:  Diagnosis Date  . Atrial fibrillation ([place])   . CAD (coronary artery disease) of artery bypass graft 2000   2000 with multiple bypasses   . Chronic anticoagulation    Coumadin therapy for mechanical aortic valve. Postoperative atrial fibrillation.   . Essential hypertension   . H/O mechanical aortic valve replacement 2000   [employer] AVR  . Hypothyroidism   . Premature ventricular contractions     Assessment: 84 yo male with AFib and mechanical AVR (INR goal 2.5-3.0) admitted with recurrent AFib RVR and CP. He is now post cath to restart heparin and coumadin until INR therapeutic.   Underwent successful cardioversion on 12/13. INR 1.32 as expected after holding, CBC stable. S/p hematoma at radial cath site and heparin drip stopped this am.    PTA warfarin: 5 mg on Mon/Wed/Fri, 7.5 mg on all other days  Goal of Therapy:  Heparin level 0.3-0.7 units/ml INR goal 2.5-3.0 Monitor platelets by anticoagulation protocol: Yes   Plan:  -Coumadin 7.5mg  po tonight  -Daily PT/INR  Bonnita Nasuti Pharm.D. CPP, BCPS Clinical Pharmacist [phone] 08/17/2018 3:24 PM

## 2018-08-18 LAB — CBC
HCT: 39 % (ref 39.0–52.0)
Hemoglobin: 12.3 g/dL — ABNORMAL LOW (ref 13.0–17.0)
MCH: 33.1 pg (ref 26.0–34.0)
MCHC: 31.5 g/dL (ref 30.0–36.0)
MCV: 104.8 fL — AB (ref 80.0–100.0)
Platelets: 135 10*3/uL — ABNORMAL LOW (ref 150–400)
RBC: 3.72 MIL/uL — ABNORMAL LOW (ref 4.22–5.81)
RDW: 12.8 % (ref 11.5–15.5)
WBC: 6.6 10*3/uL (ref 4.0–10.5)
nRBC: 0 % (ref 0.0–0.2)

## 2018-08-18 LAB — BASIC METABOLIC PANEL
Anion gap: 9 (ref 5–15)
BUN: 12 mg/dL (ref 8–23)
CO2: 25 mmol/L (ref 22–32)
Calcium: 8.8 mg/dL — ABNORMAL LOW (ref 8.9–10.3)
Chloride: 105 mmol/L (ref 98–111)
Creatinine, Ser: 1.36 mg/dL — ABNORMAL HIGH (ref 0.61–1.24)
GFR calc Af Amer: 55 mL/min — ABNORMAL LOW (ref >60)
GFR calc non Af Amer: 47 mL/min — ABNORMAL LOW (ref >60)
Glucose, Bld: 139 mg/dL — ABNORMAL HIGH (ref 70–99)
Potassium: 3.8 mmol/L (ref 3.5–5.1)
Sodium: 139 mmol/L (ref 135–145)

## 2018-08-18 LAB — PROTIME-INR
INR: 2.25
Prothrombin Time: 24.6 seconds — ABNORMAL HIGH (ref 11.4–15.2)

## 2018-08-18 LAB — MAGNESIUM: Magnesium: 1.8 mg/dL (ref 1.7–2.4)

## 2018-08-18 MED ORDER — METOPROLOL TARTRATE 25 MG PO TABS: 25.0000 mg | ORAL_TABLET | Freq: Two times a day (BID) | ORAL | 0 refills | Status: DC

## 2018-08-18 MED ORDER — METOPROLOL TARTRATE 25 MG PO TABS
25.0000 mg | ORAL_TABLET | Freq: Two times a day (BID) | ORAL | Status: DC
  Administered 2018-08-18: 25 mg via ORAL
  Filled 2018-08-18: qty 1

## 2018-08-18 MED ORDER — AMIODARONE HCL 200 MG PO TABS: ORAL_TABLET | ORAL | 0 refills | Status: DC

## 2018-08-18 MED ORDER — MAGNESIUM SULFATE 2 GM/50ML IV SOLN
2.0000 g | Freq: Once | INTRAVENOUS | Status: AC
  Administered 2018-08-18: 2 g via INTRAVENOUS
  Filled 2018-08-18: qty 50

## 2018-08-18 MED FILL — METOPROLOL TARTRATE 25 MG T: 25 | 30 days supply | Qty: 60 | Fill #0

## 2018-08-18 MED FILL — AMIODARONE HCL 200 MG TAB: 200 | 30 days supply | Qty: 44 | Fill #0

## 2018-08-18 NOTE — Progress Notes (Signed)
[place] for heparin and warfarin Indication: Atrial Fibrillation + mechanical AVR  Patient Measurements: Height: 5\' 10"  (177.8 cm) Weight: 184 lb 4.9 oz (83.6 kg) IBW/kg (Calculated) : 73   Vital Signs: Temp: 97.3 F (36.3 C) (12/16 0800) Temp Source: Oral (12/16 0800) BP: 156/86 (12/16 0800) Pulse Rate: 55 (12/16 0400)  Labs: Recent Labs    08/15/18 2009  08/16/18 0755 08/17/18 0222 08/18/18 0133  HGB  --    < > 13.9 12.6* 12.3*  HCT  --   --  44.5 40.7 39.0  PLT  --   --  145* 142* 135*  LABPROT  --   --  16.3* 19.0* 24.6*  INR  --   --  1.32 1.61 2.25  HEPARINUNFRC 0.79*  --  0.84* 0.62  --   CREATININE  --   --   --  1.31* 1.36*   < > = values in this interval not displayed.     Medical History: Past Medical History:  Diagnosis Date  . Atrial fibrillation ([place])   . CAD (coronary artery disease) of artery bypass graft 2000   2000 with multiple bypasses   . Chronic anticoagulation    Coumadin therapy for mechanical aortic valve. Postoperative atrial fibrillation.   . Essential hypertension   . H/O mechanical aortic valve replacement 2000   [employer] AVR  . Hypothyroidism   . Premature ventricular contractions     Assessment: 84 yo male with AFib and mechanical AVR (INR goal 2.5-3.0) admitted with recurrent AFib RVR and CP. He is now post cath to restart heparin and coumadin until INR therapeutic.   Underwent successful cardioversion on 12/13. INR increased from 1.61 to 2.25 likely to higher doses than PTA on 12/13.  S/p hematoma at radial cath site - heparin infusion stopped 12/15, remains stable. Hgb 12.3, plt 135. Newly started on amiodarone this admission, anticipate delayed impact on INR levels.     PTA warfarin: 5 mg on Mon/Wed/Fri, 7.5 mg on all other days  Goal of Therapy:  INR goal 2.5-3.0 given hx of GI bleeding Monitor platelets by anticoagulation protocol: Yes   Plan:  -Coumadin 5 mg po tonight  -Daily  PT/INR  Anticipate able to discharge home on PTA regimen - if home today, will do another 5 mg on 12/17 instead of normal 7.5 mg given INR trend   Antonietta Jewel, PharmD, [place] Clinical Pharmacist  Pager: [phone] Phone: [phone] 08/18/2018 9:37 AM

## 2018-08-18 NOTE — Care Management Important Message (Signed)
Important Message  Patient Details  Name: Jason Santana MRN: [id number] Date of Birth: 05-Aug-1932   Medicare Important Message Given:  Yes    Maryclare Labrador, RN 08/18/2018, 11:09 AM

## 2018-08-18 NOTE — Progress Notes (Addendum)
Progress Note  Patient Name: Jason Santana Date of Encounter: 08/18/2018  Primary Cardiologist: Sinclair Grooms, MD   Subjective   Feeling well. No chest pain, sob or palpitations.   Inpatient Medications    Scheduled Meds: . amiodarone  200 mg Oral BID  . finasteride  5 mg Oral QHS  . levothyroxine  75 mcg Oral QAC breakfast  . metoprolol tartrate  50 mg Oral BID  . multivitamin  1 tablet Oral Daily  . pantoprazole  40 mg Oral BID  . rosuvastatin  5 mg Oral Daily  . sodium chloride flush  3 mL Intravenous Q12H  . sodium chloride flush  3 mL Intravenous Q12H  . tamsulosin  0.4 mg Oral QHS  . Warfarin - Pharmacist Dosing Inpatient   Does not apply q1800   Continuous Infusions: . sodium chloride 1 mL/hr at 08/14/18 1437  . sodium chloride     PRN Meds: sodium chloride, acetaminophen, morphine injection, ondansetron (ZOFRAN) IV, sodium chloride flush, sodium chloride flush   Vital Signs    Vitals:   08/17/18 1900 08/17/18 2300 08/18/18 0400 08/18/18 0800  BP: (!) 125/51 (!) 118/55 125/63 (!) 156/86  Pulse: (!) 58 (!) 56 (!) 55   Resp: (!) 22 18 20 19   Temp: (!) 97.3 F (36.3 C) 97.6 F (36.4 C) 97.9 F (36.6 C) (!) 97.3 F (36.3 C)  TempSrc: Oral Oral Oral Oral  SpO2:      Weight:      Height:        Intake/Output Summary (Last 24 hours) at 08/18/2018 0918 Last data filed at 08/17/2018 1603 Gross per 24 hour  Intake 245 ml  Output 0 ml  Net 245 ml   Filed Weights   08/12/18 0002 08/14/18 0300 08/15/18 0854  Weight: 83.6 kg 83.6 kg 83.6 kg    Telemetry    SR at rate of 50s Personally Reviewed  ECG    N/A  Physical Exam   GEN: No acute distress.   Neck: No JVD Cardiac: RRR, no murmurs, rubs, or gallops. L radial with mild hematoma.  Respiratory: Clear to auscultation bilaterally. GI: Soft, nontender, non-distended  MS: No edema; No deformity. Neuro:  Nonfocal  Psych: Normal affect   Labs    Chemistry Recent Labs  Lab 08/14/18 0358  08/17/18 0222 08/18/18 0133  NA 137 139 139  K 4.8 4.0 3.8  CL 103 104 105  CO2 22 24 25   GLUCOSE 139* 129* 139*  BUN 13 14 12   CREATININE 1.44* 1.31* 1.36*  CALCIUM 8.9 9.0 8.8*  GFRNONAA 44* 50* 47*  GFRAA 51* 58* 55*  ANIONGAP 12 11 9      Hematology Recent Labs  Lab 08/16/18 0755 08/17/18 0222 08/18/18 0133  WBC 5.8 7.0 6.6  RBC 4.20* 3.82* 3.72*  HGB 13.9 12.6* 12.3*  HCT 44.5 40.7 39.0  MCV 106.0* 106.5* 104.8*  MCH 33.1 33.0 33.1  MCHC 31.2 31.0 31.5  RDW 12.9 12.9 12.8  PLT 145* 142* 135*    Cardiac Enzymes Recent Labs  Lab 08/12/18 0703 08/14/18 0358 08/14/18 0622 08/14/18 0949  TROPONINI <0.03 <0.03 <0.03 <0.03    Recent Labs  Lab 08/11/18 1637  TROPIPOC 0.00    DDimer  Recent Labs  Lab 08/11/18 1736  DDIMER <0.27     Radiology    No results found.  Cardiac Studies   CORONARY/GRAFT ANGIOGRAPHY  08/14/18  Conclusion     Mid RCA lesion is 100% stenosed.  Ost 2nd Mrg lesion is 100% stenosed.  Prox LAD to Mid LAD lesion is 50% stenosed.  SVG.  The graft exhibits mild .   1.  Severe two-vessel coronary artery disease with total occlusion of the OM 2 and the mid RCA, nonobstructive LAD stenosis 2.  Status post remote aortocoronary bypass surgery with continued patency of the LIMA to LAD, saphenous vein graft OM 2, and saphenous vein graft to PDA  Will resume heparin and warfarin tonight with plans noted for cardioversion.  Management per primary cardiology team.    Diagnostic  Dominance: Right     Echo 08/12/18 Study Conclusions  - Left ventricle: The cavity size was normal. There was mild   concentric hypertrophy. Systolic function was normal. The   estimated ejection fraction was in the range of 55% to 60%. Wall   motion was normal; there were no regional wall motion   abnormalities. - Ventricular septum: Septal motion showed paradox. These changes   are consistent with a post-thoracotomy state. - Aortic valve: A  mechanical prosthesis was present and functioning   normally. - Mitral valve: Calcified annulus. There was mild regurgitation. - Left atrium: The atrium was mildly dilated. - Right ventricle: Systolic function was mildly to moderately   reduced. - Right atrium: The atrium was mildly dilated.   Patient Profile     84 y.o. male with a hx of hypertension, hypothyroidism,paroxysmalatrialflutteron Coumadin, history of mechanical aortic valve replacementrequiring Coumadin,and history of CADs/pCABG in 2000whopresented with chest pain and weakness and was found to be in recurrent atrial flutter with poor rate control.  Assessment & Plan    1. Atrial flutter/Fibrillation - Rate was difficulty to control initially despite IV amiodarone. Underwent successful cardioversion on 12/13. Maintaining sinus rhythm but bradycardic in 50s. Will continue Amiodarone to 200mg  BID for another 7 days then drop to 200mg  daily. Reduce metoprolol to 25mg  BID. Continue coumadin per pharmacy.   2. CAD  - Cath this admission showed patent graft. Not on ASA due to need to anticoagulation. He does have hx of prior GI bleed. Continue statin.   3. Left radial site hematoma - Improving. Heparin stopped yesterday. INR 2.25 today.   4. HLD - 08/12/2018: Cholesterol 199; HDL 26; LDL Cholesterol 125; Triglycerides 241; VLDL 48 - Hx of memory loss on Zocor. Will continue low dose Crestor. Consider outpatient Lipid clinic referral. LDL goal less than 70.   5. Mechanical aortic valve - INR 2.25 today.   Ambulate this morning. DC likely later today vs tomorrow per Dr. Burt Knack. Medications change as above.   For questions or updates, please contact [place] Please consult [email or web address] for contact info under     SignedLeanor Kail, PA  08/18/2018, 9:18 AM    Patient seen, examined. Available data reviewed. Agree with findings, assessment, and plan as outlined by Robbie Lis, PA-C.  On my exam the  patient is an alert, oriented male in no distress.  His left arm hematoma is improving.  The forearm is now soft with diffuse ecchymoses.  Radial pulses 2+.  Otherwise exam is unchanged.  Agree with documentation above.  Patient's INR is now therapeutic and I think he is stable for discharge.  I have reviewed his telemetry which demonstrates sinus bradycardia with a heart rate of about 50 bpm during sleep.  When he is awake his heart rate is 60 bpm on average at rest.  Would recommend discharge on his current medical therapy.   He is tolerating anticoagulation  with warfarin.  [name], M.D. 08/18/2018 10:14 AM

## 2018-08-18 NOTE — Progress Notes (Signed)
Ecchymotics on Lt. Arm due to [facility]. Edema was better than before. Pt wanted to be off ACE wrap. Replaced Megnesium IVPB. Removed PIV access and pt received discharge instructions. [facility] brought the medications. Pt took his all belongings. HS [employer]

## 2018-08-18 NOTE — Discharge Summary (Signed)
Physician Discharge Summary  Jason Santana QJJ:[id number] DOB: July 05, 1932 DOA: 08/11/2018  PCP: [name], MD  Admit date: 08/11/2018 Discharge date: 08/18/2018  Admitted From: Home Disposition: Home  Recommendations for Outpatient Follow-up:  1. Follow up with PCP in 1-2 weeks 2. Please obtain BMP/CBC in one week your next doctors visit.  3. Metoprolol decreased to 25 mg twice daily. 4. Amiodarone 200 mg twice daily for 7 days followed by daily 5. Follow-up outpatient with cardiology as noted below 6. Outpatient INR check in the next 2-3 days 7. Advised to elevate his left arm to promote healing, swelling and pain.  Discharge Condition: Stable CODE STATUS: Partial Diet recommendation: Cardiac  Brief/Interim Summary: 84 year old with past medical history of mitral valve replacement on Coumadin, essential hypertension, atrial fibrillation, CAD status post CABG, hypothyroidism came to the hospital with complaints of chest pain.  Patient states he mostly get this chest pain during exertion.  He was also noted to be in atrial fibrillation with RVR.  Cardiology following the patient.  Plans for left heart catheterization 12/12 which showed previously all occluded vessels and 50% LAD stenosis.  Advised medical management at this time and resuming Coumadin.  Cardioversion performed on 12/13 was successful.  2 days after his cardiac catheterization he developed left upper extremity swelling and pain concerning for hematoma.  Heparin drip was discontinued a bandage was wrapped.  Over the course of several hours this subsided and he was advised to elevate his arm.  Pain control.  Cardiology had seen the patient. Today patient has reached maximal benefit from hospital stay and stable for discharge.  He is amiodarone will be continued for 7 more days twice daily followed by 200 mg daily.  His metoprolol dose has been decreased to 25 mg twice daily.    Discharge Diagnoses:  Active Problems:    Paroxysmal atrial flutter (HCC)   Essential hypertension   H/O mechanical aortic valve replacement   CAD (coronary artery disease) of artery bypass graft   Chronic anticoagulation   Hypothyroidism   Coronary artery disease involving native coronary artery of native heart with unstable angina pectoris (HCC)   Atypical atrial flutter (HCC)   Chest pain  Left upper extremity swelling and pain with concerns of hematoma, improved - This is greatly improved.  Bandage has been unwrapped.  Swelling is improved.  Pain is improved.  He is perfusing well in that area without any evidence of numbness, weakness.  His motor function seems intact.  He is advised to elevate his arm and use Tylenol for pain as needed.  Coronary artery disease status post CABG -This is concerning given his previous history.  Cardiac enzymes remain negative at this time.  2D echo showed preserved ejection fraction without any obvious wall motion abnormality.  Currently he remains chest pain-free. -Left heart catheterization 12/12- previously occluded RCA, OM 2.  LAD is 50%.  Advised medical management -Cardioversion performed 12/13-successful  Atrial fibrillation/flutter with RVR, status post successful cardioversion 12/13 -Today's INR is 2.2.  Metoprolol has been reduced to 25 mg twice daily. - Take amiodarone 200 mg twice daily for 7 more days followed by 200 mg orally daily -Appreciate input from cardiology service  Essential hypertension -Continue metoprolol.  Hypothyroidism -continue Synthroid  History of mechanical valve replacement -On Coumadin with therapeutic INR of 2.2  GERD -Continue Protonix 40 mg daily  BPH -Continue Flomax and finasteride  DVT prophylaxis-Coumadin CODE STATUS-partial Wife at bedside Stable for discharge with outpatient follow-up.  Discharge Instructions  Allergies as of 08/18/2018      Reactions   Vicodin [hydrocodone-acetaminophen]    Severe sensitivity   Zocor  [simvastatin]    Short memory loss.   Benicar [olmesartan]    Abdominal cramping and increased stools/ irritable bowel   Codeine Nausea And Vomiting   Lopid [gemfibrozil] Other (See Comments)   transaminitis   Monopril [fosinopril]    transaminitis   Phenergan [promethazine Hcl]    Severe somnolence.   Prilosec [omeprazole]    dyspepsia      Medication List    STOP taking these medications   acetaminophen 325 MG tablet Commonly known as:  TYLENOL     TAKE these medications   amiodarone 200 MG tablet Commonly known as:  PACERONE Take 1 tablet (200 mg total) by mouth 2 (two) times daily for 7 days, THEN 1 tablet (200 mg total) daily. Start taking on:  August 18, 2018   COUMADIN 5 MG tablet Generic drug:  warfarin Take 5-7.5 mg by mouth See admin instructions. Take 5 mg by mouth once daily on Mon, Wed, and Fri, take 7.5 mg once daily on Tues, Thurs, Sat, and Sun   diphenhydramine-acetaminophen 25-500 MG Tabs tablet Commonly known as:  TYLENOL PM Take 0.5 tablets by mouth at bedtime as needed (sleep).   ferrous sulfate 325 (65 FE) MG tablet Take 325 mg by mouth daily with supper.   finasteride 5 MG tablet Commonly known as:  PROSCAR Take 5 mg by mouth at bedtime.   levothyroxine 75 MCG tablet Commonly known as:  SYNTHROID, LEVOTHROID Take 75 mcg by mouth daily before breakfast.   metoprolol tartrate 25 MG tablet Commonly known as:  LOPRESSOR Take 1 tablet (25 mg total) by mouth 2 (two) times daily. What changed:    medication strength  how much to take   pantoprazole 40 MG tablet Commonly known as:  PROTONIX Take 1 tablet (40 mg total) by mouth 2 (two) times daily.   PRESERVISION AREDS 2 Caps Take 1 capsule by mouth 2 (two) times daily.   rosuvastatin 20 MG tablet Commonly known as:  CRESTOR Take 5 mg by mouth daily.   SYSTANE OP Place 1 drop into both eyes daily as needed (dry eyes).   tamsulosin 0.4 MG Caps capsule Commonly known as:   FLOMAX Take 0.4 mg by mouth at bedtime.   tobramycin 0.3 % ophthalmic solution Commonly known as:  TOBREX Place 1 drop into the left eye See admin instructions. Instill 1 drop into left eye 1 day before, the day of, and the day after eye injections administered at Dr's office   Vitamin D 50 MCG (2000 UT) tablet Take 2,000 Units by mouth daily.      Follow-up Information    Isaiah Serge, NP. Go on 08/29/2018.   Specialties:  Cardiology, Radiology Why:  @2pm  for hospital follow up. Please arrive 10 minutes early  Contact information: [address] [place] [address] [phone]          Allergies  Allergen Reactions  . Vicodin [Hydrocodone-Acetaminophen]     Severe sensitivity  . Zocor [Simvastatin]     Short memory loss.  [name] [Olmesartan]     Abdominal cramping and increased stools/ irritable bowel  . Codeine Nausea And Vomiting  . Lopid [Gemfibrozil] Other (See Comments)    transaminitis  . Monopril [Fosinopril]     transaminitis  . Phenergan [Promethazine Hcl]     Severe somnolence.  . Prilosec [Omeprazole]  dyspepsia    You were cared for by a hospitalist during your hospital stay. If you have any questions about your discharge medications or the care you received while you were in the hospital after you are discharged, you can call the unit and asked to speak with the hospitalist on call if the hospitalist that took care of you is not available. Once you are discharged, your primary care physician will handle any further medical issues. Please note that no refills for any discharge medications will be authorized once you are discharged, as it is imperative that you return to your primary care physician (or establish a relationship with a primary care physician if you do not have one) for your aftercare needs so that they can reassess your need for medications and monitor your lab  values.  Consultations:  Cardiology   Procedures/Studies: Dg Chest 2 View  Result Date: 08/11/2018 CLINICAL DATA:  Chest tightness EXAM: CHEST - 2 VIEW COMPARISON:  02/05/2018 FINDINGS: Post sternotomy changes and valve prosthesis. No acute airspace disease or effusion. Cardiomediastinal silhouette within normal limits. Aortic atherosclerosis. No pneumothorax. IMPRESSION: No active cardiopulmonary disease. Electronically Signed   By: Donavan Foil M.D.   On: 08/11/2018 18:03      Subjective: Feels better, no complaints.  Wishes to go home.  Reports his left upper extremity pain and swelling has greatly improved and he is able to move his hand/fingers better.  General = no fevers, chills, dizziness, malaise, fatigue HEENT/EYES = negative for pain, redness, loss of vision, double vision, blurred vision, loss of hearing, sore throat, hoarseness, dysphagia Cardiovascular= negative for chest pain, palpitation, murmurs, lower extremity swelling Respiratory/lungs= negative for shortness of breath, cough, hemoptysis, wheezing, mucus production Gastrointestinal= negative for nausea, vomiting,, abdominal pain, melena, hematemesis Genitourinary= negative for Dysuria, Hematuria, Change in Urinary Frequency MSK = Negative for arthralgia, myalgias, Back Pain, Joint swelling  Neurology= Negative for headache, seizures, numbness, tingling  Psychiatry= Negative for anxiety, depression, suicidal and homocidal ideation Allergy/Immunology= Medication/Food allergy as listed  Skin= Negative for Rash, lesions, ulcers, itching    Discharge Exam: Vitals:   08/18/18 0400 08/18/18 0800  BP: 125/63 (!) 156/86  Pulse: (!) 55   Resp: 20 19  Temp: 97.9 F (36.6 C) (!) 97.3 F (36.3 C)  SpO2:     Vitals:   08/17/18 1900 08/17/18 2300 08/18/18 0400 08/18/18 0800  BP: (!) 125/51 (!) 118/55 125/63 (!) 156/86  Pulse: (!) 58 (!) 56 (!) 55   Resp: (!) 22 18 20 19   Temp: (!) 97.3 F (36.3 C) 97.6 F (36.4  C) 97.9 F (36.6 C) (!) 97.3 F (36.3 C)  TempSrc: Oral Oral Oral Oral  SpO2:      Weight:      Height:        General: Pt is alert, awake, not in acute distress Cardiovascular: RRR, S1/S2 +, no rubs, no gallops Respiratory: CTA bilaterally, no wheezing, no rhonchi Abdominal: Soft, NT, ND, bowel sounds + Extremities: no edema, no cyanosis    The results of significant diagnostics from this hospitalization (including imaging, microbiology, ancillary and laboratory) are listed below for reference.     Microbiology: Recent Results (from the past 240 hour(s))  MRSA PCR Screening     Status: None   Collection Time: 08/12/18 12:59 AM  Result Value Ref Range Status   MRSA by PCR NEGATIVE NEGATIVE Final    Comment:        The GeneXpert MRSA Assay (FDA approved for  NASAL specimens only), is one component of a comprehensive MRSA colonization surveillance program. It is not intended to diagnose MRSA infection nor to guide or monitor treatment for MRSA infections. Performed at [facility], [place] [address], [place], [place] [address]      Labs: BNP (last 3 results) No results for input(s): BNP in the last 8760 hours. Basic Metabolic Panel: Recent Labs  Lab 08/11/18 1617 08/13/18 0328 08/14/18 0358 08/17/18 0222 08/18/18 0133  NA 139 138 137 139 139  K 4.2 4.2 4.8 4.0 3.8  CL 103 104 103 104 105  CO2 26 27 22 24 25   GLUCOSE 127* 145* 139* 129* 139*  BUN 9 10 13 14 12   CREATININE 1.20 1.33* 1.44* 1.31* 1.36*  CALCIUM 9.8 9.1 8.9 9.0 8.8*  MG  --   --   --  1.9 1.8   Liver Function Tests: No results for input(s): AST, ALT, ALKPHOS, BILITOT, PROT, ALBUMIN in the last 168 hours. No results for input(s): LIPASE, AMYLASE in the last 168 hours. No results for input(s): AMMONIA in the last 168 hours. CBC: Recent Labs  Lab 08/14/18 0358 08/15/18 0239 08/16/18 0755 08/17/18 0222 08/18/18 0133  WBC 6.7 7.5 5.8 7.0 6.6  HGB 14.9 14.5 13.9 12.6* 12.3*  HCT 47.3  45.9 44.5 40.7 39.0  MCV 103.7* 103.8* 106.0* 106.5* 104.8*  PLT 173 169 145* 142* 135*   Cardiac Enzymes: Recent Labs  Lab 08/12/18 0225 08/12/18 0703 08/14/18 0358 08/14/18 0622 08/14/18 0949  TROPONINI <0.03 <0.03 <0.03 <0.03 <0.03   BNP: Invalid input(s): POCBNP CBG: No results for input(s): GLUCAP in the last 168 hours. D-Dimer No results for input(s): DDIMER in the last 72 hours. Hgb A1c No results for input(s): HGBA1C in the last 72 hours. Lipid Profile No results for input(s): CHOL, HDL, LDLCALC, TRIG, CHOLHDL, LDLDIRECT in the last 72 hours. Thyroid function studies No results for input(s): TSH, T4TOTAL, T3FREE, THYROIDAB in the last 72 hours.  Invalid input(s): FREET3 Anemia work up No results for input(s): VITAMINB12, FOLATE, FERRITIN, TIBC, IRON, RETICCTPCT in the last 72 hours. Urinalysis    Component Value Date/Time   COLORURINE STRAW (A) 02/05/2018 1941   APPEARANCEUR CLEAR 02/05/2018 1941   LABSPEC 1.004 (L) 02/05/2018 1941   PHURINE 6.0 02/05/2018 1941   GLUCOSEU NEGATIVE 02/05/2018 1941   HGBUR NEGATIVE 02/05/2018 1941   [place] NEGATIVE 02/05/2018 [place] NEGATIVE 02/05/2018 1941   PROTEINUR NEGATIVE 02/05/2018 1941   NITRITE NEGATIVE 02/05/2018 1941   LEUKOCYTESUR NEGATIVE 02/05/2018 1941   Sepsis Labs Invalid input(s): PROCALCITONIN,  WBC,  LACTICIDVEN Microbiology Recent Results (from the past 240 hour(s))  MRSA PCR Screening     Status: None   Collection Time: 08/12/18 12:59 AM  Result Value Ref Range Status   MRSA by PCR NEGATIVE NEGATIVE Final    Comment:        The GeneXpert MRSA Assay (FDA approved for NASAL specimens only), is one component of a comprehensive MRSA colonization surveillance program. It is not intended to diagnose MRSA infection nor to guide or monitor treatment for MRSA infections. Performed at [facility], [place] [address], [place], [place] [address]      Time coordinating discharge:  I  have spent 35 minutes face to face with the patient and on the ward discussing the patients care, assessment, plan and disposition with other care givers. >50% of the time was devoted counseling the patient about the risks and benefits of treatment/Discharge  disposition and coordinating care.   SIGNED:   Damita Lack, MD  [facility] 08/18/2018, 10:37 AM Pager   If 7PM-7AM, please contact night-coverage [email or web address] Password [email or web address]

## 2018-08-18 NOTE — Discharge Instructions (Signed)

## 2018-08-20 DIAGNOSIS — Z7901 Long term (current) use of anticoagulants: Secondary | ICD-10-CM | POA: Diagnosis not present

## 2018-08-20 DIAGNOSIS — Z952 Presence of prosthetic heart valve: Secondary | ICD-10-CM | POA: Diagnosis not present

## 2018-08-20 DIAGNOSIS — S40029A Contusion of unspecified upper arm, initial encounter: Secondary | ICD-10-CM | POA: Diagnosis not present

## 2018-08-20 DIAGNOSIS — I4891 Unspecified atrial fibrillation: Secondary | ICD-10-CM | POA: Diagnosis not present

## 2018-08-22 DIAGNOSIS — Z7901 Long term (current) use of anticoagulants: Secondary | ICD-10-CM | POA: Diagnosis not present

## 2018-08-25 ENCOUNTER — Other Ambulatory Visit: Payer: Self-pay

## 2018-08-25 DIAGNOSIS — H353112 Nonexudative age-related macular degeneration, right eye, intermediate dry stage: Secondary | ICD-10-CM | POA: Diagnosis not present

## 2018-08-25 DIAGNOSIS — H353222 Exudative age-related macular degeneration, left eye, with inactive choroidal neovascularization: Secondary | ICD-10-CM | POA: Diagnosis not present

## 2018-08-25 NOTE — Patient Outreach (Signed)
Patient triggered Red on EMMI General Discharge Dashboard, notification sent to Jon Billings, RN

## 2018-08-25 NOTE — Patient Outreach (Signed)
[facility]) Care Management  08/25/2018  SHAYA ALTAMURA 1932/03/30 [id number]   EMMI- General Discharge RED ON EMMI ALERT Day # 4 Date: 08/24/18 Red Alert Reason:  Questions about discharge papers? Yes  Know who to call about changes in condition? No  Wounds healing well? No  Lost interest in things? Yes  Sad/hopeless/anxious/empty? Yes  Other questions/problems? Yes    Outreach attempt: No answer.  HIPAA compliant voice message left.    Plan: RN CM will attempt patient again within 4 business days and send letter.    Jone Baseman, RN, MSN [facility] Care Management Care Management Coordinator Direct Line [phone] Toll Free: [phone]  Fax: [phone]

## 2018-08-26 ENCOUNTER — Other Ambulatory Visit: Payer: Self-pay

## 2018-08-26 NOTE — Patient Outreach (Signed)
[facility]) Care Management  08/26/2018  RODOLFO GASTER 12-11-1931 [id number]   EMMI- General Discharge RED ON EMMI ALERT Day # 4 Date:08/24/18 Red Alert Reason: Questions about discharge papers? Yes  Know who to call about changes in condition? No  Wounds healing well? No  Lost interest in things? Yes  Sad/hopeless/anxious/empty? Yes  Other questions/problems? Yes   Outreach attempt: spoke with patient. He states he is doing ok but still having some trouble with his left arm where they did the heart cath.  He states he has some pain in the area and is taking tylenol. Advised patient to continue to take tylenol as needed and to follow up with physician or go to the ER if area gets worse. He verbalize understanding.  Reviewed red alerts with patient.  He states the answers were a mistake.    Mentioned to patient he has an appointment with cardiology on Friday.  Patient states he is aware and states he will be going.  He states he has no problems with transportation.  He denies any current further needs.   Plan: RN CM will close case.    Jone Baseman, RN, MSN [facility] Care Management Care Management Coordinator Direct Line [phone] Toll Free: [phone]  Fax: [phone]

## 2018-08-29 ENCOUNTER — Ambulatory Visit (INDEPENDENT_AMBULATORY_CARE_PROVIDER_SITE_OTHER): Payer: Medicare Other | Admitting: Cardiology

## 2018-08-29 ENCOUNTER — Encounter: Payer: Self-pay | Admitting: Cardiology

## 2018-08-29 VITALS — BP 140/64 | HR 61 | Ht 70.0 in | Wt 192.8 lb

## 2018-08-29 DIAGNOSIS — I4819 Other persistent atrial fibrillation: Secondary | ICD-10-CM | POA: Diagnosis not present

## 2018-08-29 DIAGNOSIS — Z7901 Long term (current) use of anticoagulants: Secondary | ICD-10-CM | POA: Diagnosis not present

## 2018-08-29 DIAGNOSIS — Z952 Presence of prosthetic heart valve: Secondary | ICD-10-CM

## 2018-08-29 DIAGNOSIS — S40022D Contusion of left upper arm, subsequent encounter: Secondary | ICD-10-CM

## 2018-08-29 DIAGNOSIS — N289 Disorder of kidney and ureter, unspecified: Secondary | ICD-10-CM | POA: Diagnosis not present

## 2018-08-29 DIAGNOSIS — I25708 Atherosclerosis of coronary artery bypass graft(s), unspecified, with other forms of angina pectoris: Secondary | ICD-10-CM

## 2018-08-29 MED ORDER — TRAMADOL HCL 50 MG PO TABS: 50.0000 mg | ORAL_TABLET | Freq: Four times a day (QID) | ORAL | 0 refills | Status: DC | PRN

## 2018-08-29 NOTE — Progress Notes (Signed)
Cardiology Office Note   Date:  08/29/2018   ID:  Jason Santana, DOB 1932-03-03, MRN [id number]  PCP:  [name], MD  Cardiologist:  Dr. Tamala Julian    Chief Complaint  Patient presents with  . Atrial Flutter  . Hospitalization Follow-up      History of Present Illness: Jason Santana is a 84 y.o. male who presents for post hospital follow up.  08/11/18 to 08/18/18  He hasa hx of hypertension, hypothyroidism,paroxysmalatrialflutteron Coumadin, history of mechanical aortic valve replacementrequiring Coumadin,and history of CADs/pCABG in 2000whopresented with chest pain and weakness and was found to be in recurrent atrial flutter with poor rate control.  He did have cardiac cath for  with patent grafts LIMA to LAD, saphenous vein graft OM 2, and saphenous vein graft to PDA, and echo with normal EF 55-60%.  No need for ASA.  Post cath + lt radial site hematoma, improving at discharge and IV heparin had been stopped.  A fib difficult to control even with IV amiodarone so successful DCCV 08/15/18 and in SB in 50s at discharge plan for amiodarone 200 BID for 7 days then on 23rd to 200 mg daily.   Metoprolol was decreased to 25 BID for brady.  On coumadin.   INR at discharge 2.25.   Recent INR was 4.1 at Dr. Inda Merlin office.  Then the most recent 2.8 INR -  No chest pain no SOB.  He has pain with his Lt arm cath site with resolving hematoma.  Still with pain and this makes it difficult to sleep.  He has tried tylenol with no help.  We will try ultram. No lightheadedness no syncope.   He believes his amiodarone is back down to 200 mg daily.   On his crestor Dr. Inda Merlin prescribes.  Dose was changed, pt has intolerance to statins and he was able to take a quarter of 20 mg tablet 3 times per week.  This was changed to 5 mg daily and price increased to $141 that pt cannot afford.  We would like to keep him on the meds with patent grafts and would like to keep that way.       Past Medical  History:  Diagnosis Date  . Atrial fibrillation ([place])   . CAD (coronary artery disease) of artery bypass graft 2000   2000 with multiple bypasses   . Chronic anticoagulation    Coumadin therapy for mechanical aortic valve. Postoperative atrial fibrillation.   . Essential hypertension   . H/O mechanical aortic valve replacement 2000   [employer] AVR  . Hypothyroidism   . Premature ventricular contractions     Past Surgical History:  Procedure Laterality Date  . AORTIC VALVE REPLACEMENT (AVR)/CORONARY ARTERY BYPASS GRAFTING (CABG)  2000  . CARDIOVERSION N/A 02/24/2018   Procedure: CARDIOVERSION;  Surgeon: Fay Records, MD;  Location: [facility] ENDOSCOPY;  Service: Cardiovascular;  Laterality: N/A;  . CARDIOVERSION N/A 08/15/2018   Procedure: CARDIOVERSION;  Surgeon: Thayer Headings, MD;  Location: [facility];  Service: Cardiovascular;  Laterality: N/A;  . COLONOSCOPY WITH PROPOFOL N/A 01/08/2018   Procedure: COLONOSCOPY WITH PROPOFOL;  Surgeon: Arta Silence, MD;  Location: [facility] ENDOSCOPY;  Service: Gastroenterology;  Laterality: N/A;  . CORONARY/GRAFT ANGIOGRAPHY N/A 08/14/2018   Procedure: CORONARY/GRAFT ANGIOGRAPHY;  Surgeon: [name], MD;  Location: [facility];  Service: Cardiovascular;  Laterality: N/A;  . ESOPHAGOGASTRODUODENOSCOPY (EGD) WITH PROPOFOL N/A 01/06/2018   Procedure: ESOPHAGOGASTRODUODENOSCOPY (EGD) WITH PROPOFOL;  Surgeon: Otis Brace, MD;  Location: [facility] ENDOSCOPY;  Service: Gastroenterology;  Laterality: N/A;     Current Outpatient Medications  Medication Sig Dispense Refill  . amiodarone (PACERONE) 200 MG tablet Take 200 mg by mouth daily.    . Cholecalciferol (VITAMIN D) 2000 units tablet Take 2,000 Units by mouth daily.    [name] COUMADIN 5 MG tablet Take 5-7.5 mg by mouth See admin instructions. Take 5 mg by mouth once daily on Mon, Wed, and Fri, take 7.5 mg once daily on Tues, Thurs, Sat, and Sun    . diphenhydramine-acetaminophen (TYLENOL PM) 25-500 MG  TABS tablet Take 0.5 tablets by mouth at bedtime as needed (sleep).    . ferrous sulfate 325 (65 FE) MG tablet Take 325 mg by mouth daily with supper.    . finasteride (PROSCAR) 5 MG tablet Take 5 mg by mouth at bedtime.     [name] levothyroxine (SYNTHROID, LEVOTHROID) 75 MCG tablet Take 75 mcg by mouth daily before breakfast.     . metoprolol tartrate (LOPRESSOR) 25 MG tablet Take 1 tablet (25 mg total) by mouth 2 (two) times daily. 60 tablet 0  . Multiple Vitamins-Minerals (PRESERVISION AREDS 2) CAPS Take 1 capsule by mouth 2 (two) times daily.    . pantoprazole (PROTONIX) 40 MG tablet Take 1 tablet (40 mg total) by mouth 2 (two) times daily. 60 tablet 0  . Polyethyl Glycol-Propyl Glycol (SYSTANE OP) Place 1 drop into both eyes daily as needed (dry eyes).    . rosuvastatin (CRESTOR) 20 MG tablet Take 5 mg by mouth daily.     . tamsulosin (FLOMAX) 0.4 MG CAPS capsule Take 0.4 mg by mouth at bedtime.     [name] tobramycin (TOBREX) 0.3 % ophthalmic solution Place 1 drop into the left eye See admin instructions. Instill 1 drop into left eye 1 day before, the day of, and the day after eye injections administered at Dr's office    . traMADol (ULTRAM) 50 MG tablet Take 1 tablet (50 mg total) by mouth every 6 (six) hours as needed for moderate pain. 30 tablet 0   No current facility-administered medications for this visit.     Allergies:   Vicodin [hydrocodone-acetaminophen]; Zocor [simvastatin]; Benicar [olmesartan]; Codeine; Lopid [gemfibrozil]; Monopril [fosinopril]; Phenergan [promethazine hcl]; and Prilosec [omeprazole]    Social History:  The patient  reports that he has quit smoking. His smoking use included cigarettes. He has never used smokeless tobacco. He reports that he does not drink alcohol or use drugs.   Family History:  The patient's family history includes Heart attack in his mother; Hypertension in his father and mother.    ROS:  General:no colds or fevers, no weight changes Skin:no  rashes or ulcers, + bruising or Lt lower arm HEENT:no blurred vision, no congestion CV:see HPI PUL:see HPI GI:no diarrhea constipation or melena, no indigestion GU:no hematuria, no dysuria MS:no joint pain, no claudication Neuro:no syncope, no lightheadedness Endo:no diabetes, + thyroid disease  Wt Readings from Last 3 Encounters:  08/29/18 192 lb 12.8 oz (87.5 kg)  08/15/18 184 lb 4.9 oz (83.6 kg)  03/07/18 192 lb (87.1 kg)     PHYSICAL EXAM: VS:  BP 140/64   Pulse 61   Ht 5\' 10"  (1.778 m)   Wt 192 lb 12.8 oz (87.5 kg)   BMI 27.66 kg/m  , BMI Body mass index is 27.66 kg/m. General:Pleasant affect, NAD Skin:Warm and dry, brisk capillary refill HEENT:normocephalic, sclera clear, mucus membranes moist Neck:supple, no JVD, no bruits  Heart:S1S2 RRR with  crisp closure of aortic valve, heard through to back,  No obvious murmur, gallup, rub or click Lungs:clear without rales, rhonchi, or wheezes DUK:GURK, non tender, + BS, do not palpate liver spleen or masses Ext:no lower ext edema, 2+ pedal pulses, 2+ radial pulses, Lt lower arm with ecchymosis mostly near elbow now.  Neuro:alert and oriented X 3, MAE, follows commands, + facial symmetry    EKG:  EKG is ordered today. The ekg ordered today demonstrates SR at 63 no acute changes + LVH.     Recent Labs: 01/08/2018: ALT 32 08/12/2018: TSH 4.958 08/18/2018: BUN 12; Creatinine, Ser 1.36; Hemoglobin 12.3; Magnesium 1.8; Platelets 135; Potassium 3.8; Sodium 139    Lipid Panel    Component Value Date/Time   CHOL 199 08/12/2018 0225   TRIG 241 (H) 08/12/2018 0225   HDL 26 (L) 08/12/2018 0225   CHOLHDL 7.7 08/12/2018 0225   VLDL 48 (H) 08/12/2018 0225   LDLCALC 125 (H) 08/12/2018 0225       Other studies Reviewed: Additional studies/ records that were reviewed today include: . CORONARY/GRAFT ANGIOGRAPHY  08/14/18  Conclusion     Mid RCA lesion is 100% stenosed.  Ost 2nd Mrg lesion is 100% stenosed.  Prox LAD to  Mid LAD lesion is 50% stenosed.  SVG.  The graft exhibits mild .  1. Severe two-vessel coronary artery disease with total occlusion of the OM 2 and the mid RCA, nonobstructive LAD stenosis 2. Status post remote aortocoronary bypass surgery with continued patency of the LIMA to LAD, saphenous vein graft OM 2, and saphenous vein graft to PDA  Will resume heparin and warfarin tonight with plans noted for cardioversion. Management per primary cardiology team.    Diagnostic  Dominance: Right     Echo 08/12/18 Study Conclusions  - Left ventricle: The cavity size was normal. There was mild concentric hypertrophy. Systolic function was normal. The estimated ejection fraction was in the range of 55% to 60%. Wall motion was normal; there were no regional wall motion abnormalities. - Ventricular septum: Septal motion showed paradox. These changes are consistent with a post-thoracotomy state. - Aortic valve: A mechanical prosthesis was present and functioning normally. - Mitral valve: Calcified annulus. There was mild regurgitation. - Left atrium: The atrium was mildly dilated. - Right ventricle: Systolic function was mildly to moderately reduced. - Right atrium: The atrium was mildly dilated.    ASSESSMENT AND PLAN:  1.  PAF placed on amiodarone and DCCV successfully to SR-SB.  Now on po amiodarone 200 mg daily, he believes this is what he is on - he will check when he goes home.   On warfarin due to mechanical aortic valve.  He is maintaining SR.  He will follow up prn and in March with Dr. Tamala Julian. (this is 3rd episode of a flutter/fib)  2.  Lt arm hematoma improving but still with pain, will add ultram #30 no refills.  Tylenol did not help.    3.  CAD with patent grafts on cath recently.  Normal EF   4.  CKD 3 will check BMP today.   5.  HLD meds adjusted when renewed will have pt check with Dr. Inda Merlin for different script. He has intolerance to statins and  has been able to tolerate lower dose of crestor. But he cannot afford $141..    6.  On anticoagulation with mechanical Aortic valve followed by Dr. Inda Merlin.  7.  CAD, hx of CABG with patent grafts on cath this month.  Current medicines are reviewed with the patient today.  The patient Has no concerns regarding medicines.  The following changes have been made:  See above Labs/ tests ordered today include:see above  Disposition:   FU:  see above  Signed, Cecilie Kicks, NP  08/29/2018 5:40 PM    [facility] [place], [place], [address] [place], [place] Phone: [phone]; Fax: [phone]

## 2018-08-29 NOTE — Patient Instructions (Signed)
Medication Instructions:  Your physician recommends that you continue on your current medications as directed. Please refer to the Current Medication list given to you today.  If you need a refill on your cardiac medications before your next appointment, please call your pharmacy.   Lab work: TODAY:  BMET  If you have labs (blood work) drawn today and your tests are completely normal, you will receive your results only by: [name] MyChart Message (if you have MyChart) OR . A paper copy in the mail If you have any lab test that is abnormal or we need to change your treatment, we will call you to review the results.  Testing/Procedures: None ordered  Follow-Up: Your physician recommends that you schedule a follow-up appointment in: [place] Tamala Julian

## 2018-08-30 LAB — BASIC METABOLIC PANEL
BUN / CREAT RATIO: 10 (ref 10–24)
BUN: 13 mg/dL (ref 8–27)
CHLORIDE: 99 mmol/L (ref 96–106)
CO2: 19 mmol/L — ABNORMAL LOW (ref 20–29)
Calcium: 9.5 mg/dL (ref 8.6–10.2)
Creatinine, Ser: 1.35 mg/dL — ABNORMAL HIGH (ref 0.76–1.27)
GFR calc Af Amer: 55 mL/min/{1.73_m2} — ABNORMAL LOW (ref >59)
GFR calc non Af Amer: 48 mL/min/{1.73_m2} — ABNORMAL LOW (ref >59)
Glucose: 170 mg/dL — ABNORMAL HIGH (ref 65–99)
Potassium: 4.5 mmol/L (ref 3.5–5.2)
Sodium: 139 mmol/L (ref 134–144)

## 2018-09-19 DIAGNOSIS — Z7901 Long term (current) use of anticoagulants: Secondary | ICD-10-CM | POA: Diagnosis not present

## 2018-10-17 DIAGNOSIS — Z7901 Long term (current) use of anticoagulants: Secondary | ICD-10-CM | POA: Diagnosis not present

## 2018-10-22 ENCOUNTER — Encounter (HOSPITAL_COMMUNITY): Payer: Self-pay | Admitting: Emergency Medicine

## 2018-10-22 ENCOUNTER — Emergency Department (HOSPITAL_COMMUNITY): Payer: Medicare Other

## 2018-10-22 ENCOUNTER — Other Ambulatory Visit: Payer: Self-pay

## 2018-10-22 ENCOUNTER — Emergency Department (HOSPITAL_COMMUNITY)
Admission: EM | Admit: 2018-10-22 | Discharge: 2018-10-22 | Disposition: A | Discharge status: Home / Self Care | Payer: Medicare Other | Attending: Emergency Medicine | Admitting: Emergency Medicine

## 2018-10-22 DIAGNOSIS — I2510 Atherosclerotic heart disease of native coronary artery without angina pectoris: Secondary | ICD-10-CM | POA: Insufficient documentation

## 2018-10-22 DIAGNOSIS — R42 Dizziness and giddiness: Secondary | ICD-10-CM | POA: Diagnosis not present

## 2018-10-22 DIAGNOSIS — Z954 Presence of other heart-valve replacement: Secondary | ICD-10-CM | POA: Diagnosis not present

## 2018-10-22 DIAGNOSIS — Z7901 Long term (current) use of anticoagulants: Secondary | ICD-10-CM | POA: Diagnosis not present

## 2018-10-22 DIAGNOSIS — R55 Syncope and collapse: Secondary | ICD-10-CM

## 2018-10-22 DIAGNOSIS — I4891 Unspecified atrial fibrillation: Secondary | ICD-10-CM | POA: Diagnosis not present

## 2018-10-22 DIAGNOSIS — I499 Cardiac arrhythmia, unspecified: Secondary | ICD-10-CM | POA: Diagnosis not present

## 2018-10-22 DIAGNOSIS — I4892 Unspecified atrial flutter: Secondary | ICD-10-CM

## 2018-10-22 DIAGNOSIS — I10 Essential (primary) hypertension: Secondary | ICD-10-CM | POA: Insufficient documentation

## 2018-10-22 DIAGNOSIS — E039 Hypothyroidism, unspecified: Secondary | ICD-10-CM | POA: Insufficient documentation

## 2018-10-22 DIAGNOSIS — Z87891 Personal history of nicotine dependence: Secondary | ICD-10-CM | POA: Diagnosis not present

## 2018-10-22 DIAGNOSIS — Z79899 Other long term (current) drug therapy: Secondary | ICD-10-CM | POA: Insufficient documentation

## 2018-10-22 DIAGNOSIS — R0902 Hypoxemia: Secondary | ICD-10-CM | POA: Diagnosis not present

## 2018-10-22 LAB — COMPREHENSIVE METABOLIC PANEL
ALBUMIN: 3.7 g/dL (ref 3.5–5.0)
ALT: 53 U/L — ABNORMAL HIGH (ref 0–44)
AST: 67 U/L — ABNORMAL HIGH (ref 15–41)
Alkaline Phosphatase: 63 U/L (ref 38–126)
Anion gap: 10 (ref 5–15)
BILIRUBIN TOTAL: 1 mg/dL (ref 0.3–1.2)
BUN: 10 mg/dL (ref 8–23)
CO2: 25 mmol/L (ref 22–32)
Calcium: 9.1 mg/dL (ref 8.9–10.3)
Chloride: 103 mmol/L (ref 98–111)
Creatinine, Ser: 1.35 mg/dL — ABNORMAL HIGH (ref 0.61–1.24)
GFR calc Af Amer: 55 mL/min — ABNORMAL LOW (ref >60)
GFR, EST NON AFRICAN AMERICAN: 48 mL/min — AB (ref >60)
GLUCOSE: 147 mg/dL — AB (ref 70–99)
Potassium: 4.4 mmol/L (ref 3.5–5.1)
Sodium: 138 mmol/L (ref 135–145)
Total Protein: 7.4 g/dL (ref 6.5–8.1)

## 2018-10-22 LAB — MAGNESIUM: MAGNESIUM: 1.9 mg/dL (ref 1.7–2.4)

## 2018-10-22 LAB — CBC WITH DIFFERENTIAL/PLATELET
Abs Immature Granulocytes: 0.02 10*3/uL (ref 0.00–0.07)
Basophils Absolute: 0 10*3/uL (ref 0.0–0.1)
Basophils Relative: 1 %
EOS ABS: 0.1 10*3/uL (ref 0.0–0.5)
Eosinophils Relative: 2 %
HEMATOCRIT: 48.5 % (ref 39.0–52.0)
Hemoglobin: 15.9 g/dL (ref 13.0–17.0)
Immature Granulocytes: 0 %
Lymphocytes Relative: 16 %
Lymphs Abs: 1 10*3/uL (ref 0.7–4.0)
MCH: 34.2 pg — ABNORMAL HIGH (ref 26.0–34.0)
MCHC: 32.8 g/dL (ref 30.0–36.0)
MCV: 104.3 fL — ABNORMAL HIGH (ref 80.0–100.0)
Monocytes Absolute: 0.7 10*3/uL (ref 0.1–1.0)
Monocytes Relative: 11 %
Neutro Abs: 4.3 10*3/uL (ref 1.7–7.7)
Neutrophils Relative %: 70 %
Platelets: 175 10*3/uL (ref 150–400)
RBC: 4.65 MIL/uL (ref 4.22–5.81)
RDW: 13.4 % (ref 11.5–15.5)
WBC: 6.2 10*3/uL (ref 4.0–10.5)
nRBC: 0 % (ref 0.0–0.2)

## 2018-10-22 LAB — URINALYSIS, ROUTINE W REFLEX MICROSCOPIC
Bilirubin Urine: NEGATIVE
Glucose, UA: NEGATIVE mg/dL
Hgb urine dipstick: NEGATIVE
Ketones, ur: NEGATIVE mg/dL
Leukocytes,Ua: NEGATIVE
Nitrite: NEGATIVE
Protein, ur: NEGATIVE mg/dL
Specific Gravity, Urine: 1.01 (ref 1.005–1.030)
pH: 8 (ref 5.0–8.0)

## 2018-10-22 LAB — PROTIME-INR
INR: 3.58
Prothrombin Time: 35.2 seconds — ABNORMAL HIGH (ref 11.4–15.2)

## 2018-10-22 LAB — TSH: TSH: 18.08 u[IU]/mL — ABNORMAL HIGH (ref 0.350–4.500)

## 2018-10-22 LAB — BRAIN NATRIURETIC PEPTIDE: B Natriuretic Peptide: 242.5 pg/mL — ABNORMAL HIGH (ref 0.0–100.0)

## 2018-10-22 MED ORDER — PROPOFOL 10 MG/ML IV BOLUS
0.5000 mg/kg | Freq: Once | INTRAVENOUS | Status: AC
  Administered 2018-10-22: 50 mg via INTRAVENOUS
  Filled 2018-10-22: qty 20

## 2018-10-22 MED ORDER — AMIODARONE HCL 400 MG PO TABS: 400.0000 mg | ORAL_TABLET | Freq: Every day | ORAL | 0 refills | Status: DC

## 2018-10-22 MED ORDER — SODIUM CHLORIDE 0.9% FLUSH: 3.0000 mL | INTRAVENOUS | Status: DC | PRN

## 2018-10-22 MED ORDER — SODIUM CHLORIDE 0.9% FLUSH
3.0000 mL | Freq: Two times a day (BID) | INTRAVENOUS | Status: DC
  Administered 2018-10-22: 3 mL via INTRAVENOUS

## 2018-10-22 MED ORDER — SODIUM CHLORIDE 0.9 % IV SOLN
250.0000 mL | INTRAVENOUS | Status: DC
  Administered 2018-10-22: 250 mL via INTRAVENOUS

## 2018-10-22 MED ORDER — PROPOFOL 10 MG/ML IV BOLUS
INTRAVENOUS | Status: AC | PRN
  Administered 2018-10-22: 50 mg via INTRAVENOUS

## 2018-10-22 NOTE — Discharge Instructions (Signed)
Increase your amiodarone dose to 400 mg a day.  This is double the dose you were taking before.  You may either take 2 of the pills you were taking, or use the new prescription. Your TSH level today was high, which means that your thyroid function is low.  This needs to be followed up on by either cardiology or your primary care doctor. It is important that you call Dr. Tamala Julian for follow-up appointment for recheck and further evaluation of your symptoms. If you develop chest pain, shortness of breath, weakness, or feel acute in a pass out, return to the emergency room immediately.  Return with any new, worsening, concerning symptoms.

## 2018-10-22 NOTE — ED Triage Notes (Signed)
Pt arrives from home where he lives with wife- pt got out of bed this morning and had a near syncope event. Pt was noted to be in a fib with ems rate 90s-110. Pt is alert and oriented to person place and time.

## 2018-10-22 NOTE — ED Notes (Signed)
Pt ambulated on monitor and heart remained in nsr.  Denied dizziness or weakness.  Tolerating sprite.

## 2018-10-22 NOTE — ED Provider Notes (Addendum)
[facility] EMERGENCY DEPARTMENT Provider Note   CSN: [id number] Arrival date & time: 10/22/18  0720    History   Chief Complaint Chief Complaint  Patient presents with  . Near Syncope    HPI MAAN ZARCONE is a 85 y.o. male presenting for evaluation of presyncopal episode.  Patient states that when he woke up this morning, he stood up and felt like he was about to pass out.  Patient states he was able to make it to the bathroom, but was scared to stand up off the toilet due to feeling like he was going to pass out.  He denies any chest pain or shortness of breath.  Upon EMS arrival, patient was found to be tachycardic and hypotensive, especially with ambulation.  He was given 500 cc of fluid, patient reports her symptoms improved.  He reports no symptoms at rest.  He denies recent fevers, chills, chest pain, shortness breath, nausea, vomiting, abdominal pain, abnormal bowel movements.  Patient states he had to urinate frequently last night, has not urinated since.  Additional history obtained from chart review.  Patient with a history of A. fib/a flutter on Coumadin, history of aortic valve replacement, hypertension, CAD, hypothyroidism.  Patient was admitted for cardioversion in December 2019.     HPI  Past Medical History:  Diagnosis Date  . Atrial fibrillation ([place])   . CAD (coronary artery disease) of artery bypass graft 2000   2000 with multiple bypasses   . Chronic anticoagulation    Coumadin therapy for mechanical aortic valve. Postoperative atrial fibrillation.   . Essential hypertension   . H/O mechanical aortic valve replacement 2000   [employer] AVR  . Hypothyroidism   . Premature ventricular contractions     Patient Active Problem List   Diagnosis Date Noted  . Chest pain 08/11/2018  . Atypical atrial flutter ([place]) 02/07/2018  . Syncope 01/04/2018  . Coronary artery disease involving native coronary artery of native heart with unstable angina  pectoris ([place]) 01/04/2018  . GI bleed 01/03/2018  . Macular degeneration of left eye 05/26/2017  . Hypothyroidism 05/26/2017  . Hyponatremia 05/25/2017  . Sepsis ([place]) 05/25/2017  . Muscle weakness 12/13/2014  . Paroxysmal atrial flutter ([place]) 11/25/2013  . Essential hypertension 11/25/2013  . H/O mechanical aortic valve replacement 11/25/2013  . CAD (coronary artery disease) of artery bypass graft 11/25/2013  . Chronic anticoagulation 11/25/2013  . Premature ventricular contractions 11/25/2013    Past Surgical History:  Procedure Laterality Date  . AORTIC VALVE REPLACEMENT (AVR)/CORONARY ARTERY BYPASS GRAFTING (CABG)  2000  . CARDIOVERSION N/A 02/24/2018   Procedure: CARDIOVERSION;  Surgeon: Fay Records, MD;  Location: [facility] ENDOSCOPY;  Service: Cardiovascular;  Laterality: N/A;  . CARDIOVERSION N/A 08/15/2018   Procedure: CARDIOVERSION;  Surgeon: Thayer Headings, MD;  Location: [facility];  Service: Cardiovascular;  Laterality: N/A;  . COLONOSCOPY WITH PROPOFOL N/A 01/08/2018   Procedure: COLONOSCOPY WITH PROPOFOL;  Surgeon: Arta Silence, MD;  Location: [facility] ENDOSCOPY;  Service: Gastroenterology;  Laterality: N/A;  . CORONARY/GRAFT ANGIOGRAPHY N/A 08/14/2018   Procedure: CORONARY/GRAFT ANGIOGRAPHY;  Surgeon: [name], MD;  Location: [facility] CV LAB;  Service: Cardiovascular;  Laterality: N/A;  . ESOPHAGOGASTRODUODENOSCOPY (EGD) WITH PROPOFOL N/A 01/06/2018   Procedure: ESOPHAGOGASTRODUODENOSCOPY (EGD) WITH PROPOFOL;  Surgeon: Otis Brace, MD;  Location: [facility] ENDOSCOPY;  Service: Gastroenterology;  Laterality: N/A;        Home Medications    Prior to Admission medications   Medication Sig Start Date  End Date Taking? Authorizing Provider  Cholecalciferol (VITAMIN D) 2000 units tablet Take 2,000 Units by mouth daily.   Yes [provider]  COUMADIN 5 MG tablet Take 5-7.5 mg by mouth See admin instructions. Take 5 mg by mouth once daily on all days except on  Monday and Friday taking 7.5mg . 09/18/13  Yes [provider]  diphenhydramine-acetaminophen (TYLENOL PM) 25-500 MG TABS tablet Take 0.5 tablets by mouth at bedtime as needed (sleep).   Yes [provider]  ferrous sulfate 325 (65 FE) MG tablet Take 325 mg by mouth daily with supper.   Yes [provider]  finasteride (PROSCAR) 5 MG tablet Take 5 mg by mouth at bedtime.  10/07/17  Yes [provider]  levothyroxine (SYNTHROID, LEVOTHROID) 75 MCG tablet Take 75 mcg by mouth daily before breakfast.  10/06/13  Yes [provider]  metoprolol tartrate (LOPRESSOR) 25 MG tablet Take 1 tablet (25 mg total) by mouth 2 (two) times daily. 08/18/18 10/22/18 Yes Amin, Jeanella Flattery, MD  Multiple Vitamins-Minerals (PRESERVISION AREDS 2) CAPS Take 1 capsule by mouth 2 (two) times daily.   Yes [provider]  pantoprazole (PROTONIX) 40 MG tablet Take 1 tablet (40 mg total) by mouth 2 (two) times daily. Patient taking differently: Take 40 mg by mouth daily.  01/08/18  Yes Sheikh, Omair Latif, DO  Polyethyl Glycol-Propyl Glycol (SYSTANE OP) Place 1 drop into both eyes daily as needed (dry eyes).   Yes [provider]  rosuvastatin (CRESTOR) 20 MG tablet Take 5 mg by mouth daily.    Yes [provider]  tamsulosin (FLOMAX) 0.4 MG CAPS capsule Take 0.4 mg by mouth at bedtime.    Yes [provider]  tobramycin (TOBREX) 0.3 % ophthalmic solution Place 1 drop into the left eye See admin instructions. Instill 1 drop into left eye 1 day before, the day of, and the day after eye injections administered at Dr's office   Yes [provider]  traMADol (ULTRAM) 50 MG tablet Take 1 tablet (50 mg total) by mouth every 6 (six) hours as needed for moderate pain. 08/29/18  Yes Isaiah Serge, NP  amiodarone (PACERONE) 400 MG tablet Take 1 tablet (400 mg total) by mouth daily for 30 days. 10/22/18 11/21/18  Sehar Sedano, PA-C    Family  History Family History  Problem Relation Age of Onset  . Hypertension Mother   . Heart attack Mother   . Hypertension Father     Social History Social History   Tobacco Use  . Smoking status: Former Smoker    Types: Cigarettes  . Smokeless tobacco: Never Used  . Tobacco comment: quit 57 years ago  Substance Use Topics  . Alcohol use: No  . Drug use: No     Allergies   Vicodin [hydrocodone-acetaminophen]; Zocor [simvastatin]; Benicar [olmesartan]; Codeine; Lopid [gemfibrozil]; Monopril [fosinopril]; Phenergan [promethazine hcl]; and Prilosec [omeprazole]   Review of Systems Review of Systems  Genitourinary: Positive for frequency (Frequency last night).  Neurological: Positive for light-headedness (Felt like he was going to pass out, improved).  All other systems reviewed and are negative.    Physical Exam Updated Vital Signs BP 118/73   Pulse 96   Temp 97.8 F (36.6 C) (Oral)   Resp (!) 22   Ht 5\' 10"  (1.778 m)   Wt 82.1 kg   SpO2 94%   BMI 25.97 kg/m   Physical Exam Vitals signs and nursing note reviewed.  Constitutional:  General: He is not in acute distress.    Appearance: He is well-developed.     Comments: Elderly male resting comfortably in the bed in no acute distress  HENT:     Head: Normocephalic and atraumatic.  Eyes:     Extraocular Movements: Extraocular movements intact.     Conjunctiva/sclera: Conjunctivae normal.     Pupils: Pupils are equal, round, and reactive to light.  Neck:     Musculoskeletal: Normal range of motion and neck supple.  Cardiovascular:     Rate and Rhythm: Normal rate. Rhythm irregular.     Pulses: Normal pulses.     Comments: HR irregular  Pulmonary:     Effort: Pulmonary effort is normal. No respiratory distress.     Breath sounds: No wheezing.     Comments: Speaking in full sentences.  Crackles heard at bilateral bases. Abdominal:     General: There is no distension.     Palpations: Abdomen is soft. There  is no mass.     Tenderness: There is no abdominal tenderness. There is no guarding or rebound.  Musculoskeletal: Normal range of motion.     Right lower leg: No edema.     Left lower leg: No edema.     Comments: No ttp of the legs. No swelling  Skin:    General: Skin is warm and dry.     Capillary Refill: Capillary refill takes less than 2 seconds.  Neurological:     Mental Status: He is alert and oriented to person, place, and time.      ED Treatments / Results  Labs (all labs ordered are listed, but only abnormal results are displayed) Labs Reviewed  CBC WITH DIFFERENTIAL/PLATELET - Abnormal; Notable for the following components:      Result Value   MCV 104.3 (*)    MCH 34.2 (*)    All other components within normal limits  COMPREHENSIVE METABOLIC PANEL - Abnormal; Notable for the following components:   Glucose, Bld 147 (*)    Creatinine, Ser 1.35 (*)    AST 67 (*)    ALT 53 (*)    GFR calc non Af Amer 48 (*)    GFR calc Af Amer 55 (*)    All other components within normal limits  TSH - Abnormal; Notable for the following components:   TSH 18.080 (*)    All other components within normal limits  PROTIME-INR - Abnormal; Notable for the following components:   Prothrombin Time 35.2 (*)    All other components within normal limits  BRAIN NATRIURETIC PEPTIDE - Abnormal; Notable for the following components:   B Natriuretic Peptide 242.5 (*)    All other components within normal limits  URINE CULTURE  URINALYSIS, ROUTINE W REFLEX MICROSCOPIC  MAGNESIUM    EKG EKG Interpretation  Date/Time:  Wednesday October 22 2018 11:36:35 EST Ventricular Rate:  144 PR Interval:  256 QRS Duration: 116 QT Interval:  355 QTC Calculation: 550 R Axis:   -66 Text Interpretation:  Atrial fibrillation with rapid V-rate LVH with IVCD, LAD and secondary repol abnrm When compared to prior, now Afib with RVR.  No STEMI Confirmed by Antony Blackbird [phone]) on 10/22/2018 11:42:45  AM   Radiology Dg Chest 2 View  Result Date: 10/22/2018 CLINICAL DATA:  Near syncope EXAM: CHEST - 2 VIEW COMPARISON:  08/11/2018 FINDINGS: Status post median sternotomy and CABG with valvular prosthesis. Unchanged elevation of the left hemidiaphragm. No acute appearing airspace opacity. The visualized skeletal  structures are unremarkable. IMPRESSION: No acute abnormality of the lungs. Unchanged elevation of the left hemidiaphragm. No acute appearing airspace opacity. Electronically Signed   By: Eddie Candle M.D.   On: 10/22/2018 08:39    Procedures .Cardioversion Date/Time: 10/22/2018 3:17 PM Performed by: Franchot Heidelberg, PA-C Authorized by: Franchot Heidelberg, PA-C   Consent:    Consent obtained:  Verbal   Consent given by:  Patient   Risks discussed:  Cutaneous burn, death, induced arrhythmia and pain Pre-procedure details:    Cardioversion basis:  Emergent   Rhythm:  Atrial flutter   Electrode placement:  Anterior-posterior Patient sedated: Yes. Refer to sedation procedure documentation for details of sedation.  Attempt one:    Cardioversion mode:  Synchronous   Waveform:  Monophasic   Shock (Joules):  120   Shock outcome:  Conversion to normal sinus rhythm Post-procedure details:    Patient status:  Alert   Patient tolerance of procedure:  Tolerated well, no immediate complications  .Critical Care Performed by: Franchot Heidelberg, PA-C Authorized by: Franchot Heidelberg, PA-C   Critical care provider statement:    Critical care time (minutes):  50   Critical care time was exclusive of:  Separately billable procedures and treating other patients and teaching time   Critical care was necessary to treat or prevent imminent or life-threatening deterioration of the following conditions:  Cardiac failure   Critical care was time spent personally by me on the following activities:  Blood draw for specimens, development of treatment plan with patient or surrogate, discussions  with consultants, evaluation of patient's response to treatment, examination of patient, obtaining history from patient or surrogate, ordering and performing treatments and interventions, ordering and review of laboratory studies, ordering and review of radiographic studies, pulse oximetry, re-evaluation of patient's condition and review of old charts   I assumed direction of critical care for this patient from another provider in my specialty: no   Comments:     Pt with unstable arrhythmia needing procedural sedation and cardioversion.    (including critical care time)  Medications Ordered in ED Medications  sodium chloride flush (NS) 0.9 % injection 3 mL (3 mLs Intravenous Given 10/22/18 1227)  sodium chloride flush (NS) 0.9 % injection 3 mL (has no administration in time range)  0.9 %  sodium chloride infusion (0 mLs Intravenous Stopped 10/22/18 1349)  propofol (DIPRIVAN) 10 mg/mL bolus/IV push 41.1 mg (50 mg Intravenous Given 10/22/18 1212)  propofol (DIPRIVAN) 10 mg/mL bolus/IV push (50 mg Intravenous Given 10/22/18 1212)     Initial Impression / Assessment and Plan / ED Course  I have reviewed the triage vital signs and the nursing notes.  Pertinent labs & imaging results that were available during my care of the patient were reviewed by me and considered in my medical decision making (see chart for details).        Patient presenting for evaluation of presyncopal feeling.  Heart rate irregular, elevated between 90 and 110 at rest, increasing up to 180 with exertion.  Additionally, with exertion or ambulation, blood pressure drops to the 90s.  While patient improved with fluids, concern that patient has needed previous admissions with cardioversion.  Pulmonary exam concerning, patient with crackles.  Consider CHF, despite lack of peripheral edema.  Will obtain labs, chest x-ray, EKG, and reassess.  Labs without leukocytosis.  Creatinine stable.  INR at therapeutic level at 3.5.  Mag  stable.  TSH elevated at 18, significantly higher from previous values.  No recent change  in Synthroid level.  BNP mildly elevated at 242, no previous to compare.  CXR viewed and interpreted by me, no pna, pnx, effusion, or cardiomegaly. EKG without stemi. Patient remains in the 90s at rest, 140s with exertion.  Will consult with cardiology.  Discussed with Dr. Tamala Julian, who recommends cardioversion in the ER, as patient needed previous cardioversion due to concerns for onset of heart failure.  Recommends follow-up in the clinic if he converts, and increasing amiodarone dose to 400 mg daily.  He is aware that the TSH is elevated.   Cardioversion performed as described above.  Patient tolerated well, converted to normal sinus rhythm.  After cardioversion, patient ambulated without significant change in heart rate or blood pressure.  Patient states he has no further presyncopal feelings, and would like to go home.  Discussed change in medication, and importance of close follow-up with cardiology.  At this time, patient be safe discharge.  Return precautions given.  Patient states he understands and agrees to plan.  Final Clinical Impressions(s) / ED Diagnoses   Final diagnoses:  Atrial flutter, unspecified type ([place])  Near syncope  Hypothyroidism, unspecified type    ED Discharge Orders         Ordered    amiodarone (PACERONE) 400 MG tablet  Daily     10/22/18 Berino, Fountain Derusha, PA-C 10/22/18 1714    Tegeler, Gwenyth Allegra, MD 10/23/18 [address], Xayvion Shirah, PA-C 11/03/18 2153    Tegeler, Gwenyth Allegra, MD 11/04/18 [phone]

## 2018-10-22 NOTE — Sedation Documentation (Addendum)
Pt shocked @ 1214 with 120J & immediately went into NSR.

## 2018-10-22 NOTE — Progress Notes (Signed)
RT NOTE:  RT was at bedside on standby during pt's cardioversion.

## 2018-10-23 ENCOUNTER — Telehealth: Payer: Self-pay | Admitting: *Deleted

## 2018-10-23 LAB — URINE CULTURE: Culture: NO GROWTH

## 2018-10-23 NOTE — ED Provider Notes (Signed)
.  Sedation Date/Time: 10/23/2018 2:05 PM Performed by: Courtney Paris, MD Authorized by: Courtney Paris, MD   Consent:    Consent obtained:  Verbal   Consent given by:  Patient   Risks discussed:  Allergic reaction, dysrhythmia, inadequate sedation, nausea, prolonged hypoxia resulting in organ damage, prolonged sedation necessitating reversal, respiratory compromise necessitating ventilatory assistance and intubation and vomiting   Alternatives discussed:  Analgesia without sedation, anxiolysis and regional anesthesia Universal protocol:    Procedure explained and questions answered to patient or proxy's satisfaction: yes     Relevant documents present and verified: yes     Test results available and properly labeled: yes     Imaging studies available: yes     Required blood products, implants, devices, and special equipment available: yes     Site/side marked: yes     Immediately prior to procedure a time out was called: yes     Patient identity confirmation method:  Verbally with patient Indications:    Procedure necessitating sedation performed by:  Physician performing sedation Pre-sedation assessment:    Time since last food or drink:  Hours   ASA classification: class 1 - normal, healthy patient     Neck mobility: normal     Mouth opening:  3 or more finger widths   Thyromental distance:  4 finger widths   Mallampati score:  I - soft palate, uvula, fauces, pillars visible   Pre-sedation assessments completed and reviewed: airway patency, cardiovascular function, hydration status, mental status, nausea/vomiting, pain level, respiratory function and temperature   Immediate pre-procedure details:    Reassessment: Patient reassessed immediately prior to procedure     Reviewed: vital signs, relevant labs/tests and NPO status     Verified: bag valve mask available, emergency equipment available, intubation equipment available, IV patency confirmed, oxygen available and  suction available   Procedure details (see MAR for exact dosages):    Preoxygenation:  Nasal cannula   Sedation:  Propofol   Intra-procedure monitoring:  Blood pressure monitoring, cardiac monitor, continuous pulse oximetry, frequent LOC assessments, frequent vital sign checks and continuous capnometry   Intra-procedure events: none     Total Provider sedation time (minutes):  30 Post-procedure details:    Attendance: Constant attendance by certified staff until patient recovered     Recovery: Patient returned to pre-procedure baseline     Post-sedation assessments completed and reviewed: airway patency, cardiovascular function, hydration status, mental status, nausea/vomiting, pain level, respiratory function and temperature     Patient is stable for discharge or admission: yes     Patient tolerance:  Tolerated well, no immediate complications    Propofol sedation for cardioversion. No complications.    Xiomar Crompton, Gwenyth Allegra, MD 10/23/18 [phone]

## 2018-10-23 NOTE — Telephone Encounter (Signed)
Follow up:    Patient returning call back.please call patient.

## 2018-10-23 NOTE — Telephone Encounter (Signed)
Left message to call back  Ok to schedule  2/25 at 3pm or 2/27 at 3:40p or 4p

## 2018-10-23 NOTE — Telephone Encounter (Signed)
Spoke with pt and appt moved to 10/28/18 at 3:00 pm

## 2018-10-23 NOTE — Telephone Encounter (Signed)
-----   Message from Belva Crome, MD sent at 10/22/2018  9:10 PM EST ----- Regarding: Post atrial fib Please get Jason Santana in to see [place] next week about AF, hypothyroidism, and amiodarone therapy adjustment.

## 2018-10-28 ENCOUNTER — Encounter: Payer: Self-pay | Admitting: Interventional Cardiology

## 2018-10-28 ENCOUNTER — Ambulatory Visit (INDEPENDENT_AMBULATORY_CARE_PROVIDER_SITE_OTHER): Payer: Medicare Other | Admitting: Interventional Cardiology

## 2018-10-28 VITALS — BP 162/80 | HR 56 | Ht 70.0 in | Wt 192.4 lb

## 2018-10-28 DIAGNOSIS — Z7901 Long term (current) use of anticoagulants: Secondary | ICD-10-CM

## 2018-10-28 DIAGNOSIS — E038 Other specified hypothyroidism: Secondary | ICD-10-CM | POA: Diagnosis not present

## 2018-10-28 DIAGNOSIS — I10 Essential (primary) hypertension: Secondary | ICD-10-CM | POA: Diagnosis not present

## 2018-10-28 DIAGNOSIS — Z952 Presence of prosthetic heart valve: Secondary | ICD-10-CM | POA: Diagnosis not present

## 2018-10-28 DIAGNOSIS — I25708 Atherosclerosis of coronary artery bypass graft(s), unspecified, with other forms of angina pectoris: Secondary | ICD-10-CM | POA: Diagnosis not present

## 2018-10-28 DIAGNOSIS — I4892 Unspecified atrial flutter: Secondary | ICD-10-CM

## 2018-10-28 MED ORDER — LEVOTHYROXINE SODIUM 100 MCG PO TABS: 100.0000 ug | ORAL_TABLET | Freq: Every day | ORAL | 2 refills | Status: DC

## 2018-10-28 NOTE — Progress Notes (Signed)
Cardiology Office Note:    Date:  10/28/2018   ID:  Jason Santana, DOB 03-19-1932, MRN [id number]  PCP:  [name], MD  Cardiologist:  Sinclair Grooms, MD   Referring MD: [name], MD   Chief Complaint  Patient presents with  . Atrial Fibrillation  . Atrial Flutter    History of Present Illness:    Jason Santana is a 85 y.o. male with a hx of valvular heart disease with prior mechanical aortic valve replacement (2000), paroxysmal atrial fibrillation and atrial flutter, chronic anticoagulation therapy, and coronary artery disease.  Recent lower GI bleed May 2019 required pausing of anticoagulation therapy.  Electrical cardioversion performed June 2019, August 15, 2018, and October 22, 2018.  The patient has had electrical cardioversion 3 times since June 2019.  He is on amiodarone therapy.  Amiodarone therapy was increased to 400 mg/day on 10/21/2018 after emergency room discharge.  He developed atrial fibrillation on the morning of admission to the emergency department.  After conferring, we decided to perform in department electrical cardioversion if his INR was adequate.  This was carried out by Dr. Sherry Ruffing.  He has had atrial flutter in the past.  In atrial fibrillation he feels lightheaded and weak.  Atrial fibrillation occurred despite amiodarone therapy.  Amiodarone was started in December 2019.  Past Medical History:  Diagnosis Date  . Atrial fibrillation ([place])   . CAD (coronary artery disease) of artery bypass graft 2000   2000 with multiple bypasses   . Chronic anticoagulation    Coumadin therapy for mechanical aortic valve. Postoperative atrial fibrillation.   . Essential hypertension   . H/O mechanical aortic valve replacement 2000   [employer] AVR  . Hypothyroidism   . Premature ventricular contractions     Past Surgical History:  Procedure Laterality Date  . AORTIC VALVE REPLACEMENT (AVR)/CORONARY ARTERY BYPASS GRAFTING (CABG)  2000  . CARDIOVERSION N/A  02/24/2018   Procedure: CARDIOVERSION;  Surgeon: Fay Records, MD;  Location: [facility] ENDOSCOPY;  Service: Cardiovascular;  Laterality: N/A;  . CARDIOVERSION N/A 08/15/2018   Procedure: CARDIOVERSION;  Surgeon: Thayer Headings, MD;  Location: [place];  Service: Cardiovascular;  Laterality: N/A;  . COLONOSCOPY WITH PROPOFOL N/A 01/08/2018   Procedure: COLONOSCOPY WITH PROPOFOL;  Surgeon: Arta Silence, MD;  Location: [facility] ENDOSCOPY;  Service: Gastroenterology;  Laterality: N/A;  . CORONARY/GRAFT ANGIOGRAPHY N/A 08/14/2018   Procedure: CORONARY/GRAFT ANGIOGRAPHY;  Surgeon: [name], MD;  Location: [place] [facility];  Service: Cardiovascular;  Laterality: N/A;  . ESOPHAGOGASTRODUODENOSCOPY (EGD) WITH PROPOFOL N/A 01/06/2018   Procedure: ESOPHAGOGASTRODUODENOSCOPY (EGD) WITH PROPOFOL;  Surgeon: Otis Brace, MD;  Location: [facility] ENDOSCOPY;  Service: Gastroenterology;  Laterality: N/A;    Current Medications: Current Meds  Medication Sig  . amiodarone (PACERONE) 400 MG tablet Take 1 tablet (400 mg total) by mouth daily for 30 days.  . Cholecalciferol (VITAMIN D) 2000 units tablet Take 2,000 Units by mouth daily.  [name] COUMADIN 5 MG tablet Take 5-7.5 mg by mouth See admin instructions. Take 5 mg by mouth once daily on all days except on Monday and Friday taking 7.5mg .  . diphenhydramine-acetaminophen (TYLENOL PM) 25-500 MG TABS tablet Take 0.5 tablets by mouth at bedtime as needed (sleep).  . ferrous sulfate 325 (65 FE) MG tablet Take 325 mg by mouth daily with supper.  . finasteride (PROSCAR) 5 MG tablet Take 5 mg by mouth at bedtime.   . metoprolol tartrate (LOPRESSOR) 25 MG tablet Take 1 tablet (  25 mg total) by mouth 2 (two) times daily.  . Multiple Vitamins-Minerals (PRESERVISION AREDS 2) CAPS Take 1 capsule by mouth 2 (two) times daily.  . pantoprazole (PROTONIX) 40 MG tablet Take 1 tablet (40 mg total) by mouth 2 (two) times daily.  [name] Glycol-Propyl Glycol (SYSTANE OP) Place 1  drop into both eyes daily as needed (dry eyes).  . rosuvastatin (CRESTOR) 20 MG tablet Take 5 mg by mouth daily.   . tamsulosin (FLOMAX) 0.4 MG CAPS capsule Take 0.4 mg by mouth at bedtime.   [name] tobramycin (TOBREX) 0.3 % ophthalmic solution Place 1 drop into the left eye See admin instructions. Instill 1 drop into left eye 1 day before, the day of, and the day after eye injections administered at Dr's office  . traMADol (ULTRAM) 50 MG tablet Take 1 tablet (50 mg total) by mouth every 6 (six) hours as needed for moderate pain.  . [DISCONTINUED] levothyroxine (SYNTHROID, LEVOTHROID) 75 MCG tablet Take 75 mcg by mouth daily before breakfast.      Allergies:   Vicodin [hydrocodone-acetaminophen]; Zocor [simvastatin]; Benicar [olmesartan]; Codeine; Lopid [gemfibrozil]; Monopril [fosinopril]; Phenergan [promethazine hcl]; and Prilosec [omeprazole]   Social History   Socioeconomic History  . Marital status: Married    Spouse name: Not on file  . Number of children: Not on file  . Years of education: Not on file  . Highest education level: Not on file  Occupational History  . Not on file  Social Needs  . Financial resource strain: Not on file  . Food insecurity:    Worry: Not on file    Inability: Not on file  . Transportation needs:    Medical: Not on file    Non-medical: Not on file  Tobacco Use  . Smoking status: Former Smoker    Types: Cigarettes  . Smokeless tobacco: Never Used  . Tobacco comment: quit 57 years ago  Substance and Sexual Activity  . Alcohol use: No  . Drug use: No  . Sexual activity: Not on file  Lifestyle  . Physical activity:    Days per week: Not on file    Minutes per session: Not on file  . Stress: Not on file  Relationships  . Social connections:    Talks on phone: Not on file    Gets together: Not on file    Attends religious service: Not on file    Active member of club or organization: Not on file    Attends meetings of clubs or organizations: Not  on file    Relationship status: Not on file  Other Topics Concern  . Not on file  Social History Narrative  . Not on file     Family History: The patient's family history includes Heart attack in his mother; Hypertension in his father and mother.  ROS:   Please see the history of present illness.    Occasional dizziness when irregular heartbeat.  All other systems reviewed and are negative.  EKGs/Labs/Other Studies Reviewed:    The following studies were reviewed today: TSH was noted to be 18.08 on October 22, 2018.  EKG:  EKG sinus bradycardia, biatrial abnormality, interventricular conduction delay, LVH, poor R wave progression V1 through V6.  When compared to the most recent tracing on 10/22/2018 post cardioversion revealing sinus bradycardia and no change compared to today.  Recent Labs: 10/22/2018: ALT 53; B Natriuretic Peptide 242.5; BUN 10; Creatinine, Ser 1.35; Hemoglobin 15.9; Magnesium 1.9; Platelets 175; Potassium 4.4; Sodium 138;  TSH 18.080  Recent Lipid Panel    Component Value Date/Time   CHOL 199 08/12/2018 0225   TRIG 241 (H) 08/12/2018 0225   HDL 26 (L) 08/12/2018 0225   CHOLHDL 7.7 08/12/2018 0225   VLDL 48 (H) 08/12/2018 0225   LDLCALC 125 (H) 08/12/2018 0225    Physical Exam:    VS:  BP (!) 162/80   Pulse (!) 56   Ht 5\' 10"  (1.778 m)   Wt 192 lb 6.4 oz (87.3 kg)   SpO2 96%   BMI 27.61 kg/m     Wt Readings from Last 3 Encounters:  10/28/18 192 lb 6.4 oz (87.3 kg)  10/22/18 181 lb (82.1 kg)  08/29/18 192 lb 12.8 oz (87.5 kg)     GEN: Healthy appearing. No acute distress HEENT: Normal NECK: No JVD. LYMPHATICS: No lymphadenopathy CARDIAC: RRR.  2/6 systolic right upper sternal border found diastolic murmur, no gallop, no edema VASCULAR: 2+ bilateral radial and carotid pulses, no bruits RESPIRATORY:  Clear to auscultation without rales, wheezing or rhonchi  ABDOMEN: Soft, non-tender, non-distended, No pulsatile mass, MUSCULOSKELETAL: No  deformity  SKIN: Warm and dry NEUROLOGIC:  Alert and oriented x 3 PSYCHIATRIC:  Normal affect   ASSESSMENT:    1. Paroxysmal atrial flutter ([place])   2. H/O mechanical aortic valve replacement   3. Coronary artery disease of bypass graft of native heart with stable angina pectoris ([place])   4. Chronic anticoagulation   5. Essential hypertension    PLAN:    In order of problems listed above:  1. Atrial fib atrial flutter converted during recent emergency room stay.  3 cardioversions since August.  Amiodarone started in December and despite amiodarone therapy, the most recent A. fib episode occurred.  We have increased amiodarone to 400 mg/day for an additional month.  I need help from electro physiology colleagues.  He does better if we maintain rhythm.  Without rhythm control he has dizziness and decreased energy. 2. Mechanical aortic valve without significant clinical problems. 3. Asymptomatic with reference to ischemic symptoms even when heart rate is fast. 4. Will check INR today since amiodarone dose has been changed. 5. Blood pressure is high today.  This will warrant follow-up.  Target 130/80 mmHg. 6. Increase levothyroxine 100 mcg/day.  Follow-up with Dr. Mertha Finders.  TSH will be done in 4 weeks.  INR will be reported to the Coumadin clinic at [facility] [place].  We will get a consult from electrophysiology colleagues concerning management of A. Fib.  Clinical follow-up with me as required after EP consultation.   Medication Adjustments/Labs and Tests Ordered: Current medicines are reviewed at length with the patient today.  Concerns regarding medicines are outlined above.  Orders Placed This Encounter  Procedures  . Ambulatory referral to Cardiac Electrophysiology  . POCT INR  . EKG 12-Lead   Meds ordered this encounter  Medications  . levothyroxine (SYNTHROID) 100 MCG tablet    Sig: Take 1 tablet (100 mcg total) by mouth daily before breakfast.    Dispense:  30  tablet    Refill:  2    Dose change    Patient Instructions  Medication Instructions:  1) INCREASE Synthroid (Levothyroxine) to 15mcg once daily 2) On 11/20/2018, DECREASE Amiodarone to 200mg  once daily  If you need a refill on your cardiac medications before your next appointment, please call your pharmacy.   Lab work: INR today  Your physician recommends that you return for lab work in: 1 month (TSH)  If you have labs (blood work) drawn today and your tests are completely normal, you will receive your results only by: [name] MyChart Message (if you have MyChart) OR . A paper copy in the mail If you have any lab test that is abnormal or we need to change your treatment, we will call you to review the results.  Testing/Procedures: None  Follow-Up: You have been referred to our Electrophysiology department.  Dr. Tamala Julian would like for you to see someone in the next week or two. (Afib)  At [facility], you and your health needs are our priority.  As part of our continuing mission to provide you with exceptional heart care, we have created designated Provider Care Teams.  These Care Teams include your primary Cardiologist (physician) and Advanced Practice Providers (APPs -  Physician Assistants and Nurse Practitioners) who all work together to provide you with the care you need, when you need it. You will need a follow up appointment in 6 months.  Please call our office 2 months in advance to schedule this appointment.  You may see Sinclair Grooms, MD or one of the following Advanced Practice Providers on your designated Care Team:   Truitt Merle, NP Cecilie Kicks, NP . Kathyrn Drown, NP  Any Other Special Instructions Will Be Listed Below (If Applicable).       Signed, Sinclair Grooms, MD  10/28/2018 3:35 PM    [facility]

## 2018-10-28 NOTE — Patient Instructions (Addendum)
Medication Instructions:  1) INCREASE Synthroid (Levothyroxine) to 183mcg once daily 2) On 11/20/2018, DECREASE Amiodarone to 200mg  once daily  If you need a refill on your cardiac medications before your next appointment, please call your pharmacy.   Lab work: INR today  Your physician recommends that you return for lab work in: 1 month (TSH)  If you have labs (blood work) drawn today and your tests are completely normal, you will receive your results only by: [name] MyChart Message (if you have MyChart) OR . A paper copy in the mail If you have any lab test that is abnormal or we need to change your treatment, we will call you to review the results.  Testing/Procedures: None  Follow-Up: You have been referred to our Electrophysiology department.  Dr. Tamala Julian would like for you to see someone in the next week or two. (Afib)  At [facility], you and your health needs are our priority.  As part of our continuing mission to provide you with exceptional heart care, we have created designated Provider Care Teams.  These Care Teams include your primary Cardiologist (physician) and Advanced Practice Providers (APPs -  Physician Assistants and Nurse Practitioners) who all work together to provide you with the care you need, when you need it. You will need a follow up appointment in 6 months.  Please call our office 2 months in advance to schedule this appointment.  You may see Sinclair Grooms, MD or one of the following Advanced Practice Providers on your designated Care Team:   Truitt Merle, NP Cecilie Kicks, NP . Kathyrn Drown, NP  Any Other Special Instructions Will Be Listed Below (If Applicable).

## 2018-10-29 ENCOUNTER — Telehealth: Payer: Self-pay | Admitting: Cardiology

## 2018-10-29 DIAGNOSIS — H353111 Nonexudative age-related macular degeneration, right eye, early dry stage: Secondary | ICD-10-CM | POA: Diagnosis not present

## 2018-10-29 DIAGNOSIS — H43813 Vitreous degeneration, bilateral: Secondary | ICD-10-CM | POA: Diagnosis not present

## 2018-10-29 DIAGNOSIS — Z7901 Long term (current) use of anticoagulants: Secondary | ICD-10-CM | POA: Diagnosis not present

## 2018-10-29 DIAGNOSIS — H353222 Exudative age-related macular degeneration, left eye, with inactive choroidal neovascularization: Secondary | ICD-10-CM | POA: Diagnosis not present

## 2018-10-29 LAB — PROTIME-INR
INR: 6.4 (ref 0.8–1.2)
Prothrombin Time: 56.5 s — ABNORMAL HIGH (ref 9.1–12.0)

## 2018-10-29 NOTE — Telephone Encounter (Signed)
Made patient aware that I was calling earlier to check on him regarding his INR results. Made patient aware that I have already spoken to [facility] and reviewed their plan regarding his coumadin with Dr. Tamala Julian who is in agreement. Patient denies any S/Sx of bleeding. Patient appreciated the call.

## 2018-10-29 NOTE — Telephone Encounter (Signed)
  Patient is returning call to [place]

## 2018-10-29 NOTE — Telephone Encounter (Signed)
Received outpatient call from [facility] regarding critical INR of 6.4. Pt is followed at [facility], Dr. [name]. Will call patient and have him hold his dose today and follow with his Coumadin clinic after they open this AM. WiIl forward this message to Dr. [name] as well. Pt INR was checked after Cardiology appointment 10/28/2018 secondary to dose adjustment of Amiodarone. No s/s of active bleeding.   Coumadin dosing as follows- Route: Take 5-7.5 mg by mouth See admin instructions. Take 5 mg by mouth once daily on all days except on Monday and Friday taking 7.5mg .   Kathyrn Drown NP-C [facility] Pager: [phone]

## 2018-10-29 NOTE — Telephone Encounter (Signed)
Called and spoke to Coumadin Clinic at [facility] to be sure that patient was seen today. Patient was seen and repeat INR was done that came back at 5.3. Patient will hold his coumadin for 2 days and then restart taking 1/2 tablet on Tuesdays and Fridays and a whole tablet on the other days. Patient was scheduled to go back to their coumadin clinic on 3/11 repeat INR. Dr. Tamala Julian made aware.

## 2018-11-03 ENCOUNTER — Ambulatory Visit (HOSPITAL_COMMUNITY): Payer: Medicare Other | Admitting: Physician Assistant

## 2018-11-04 ENCOUNTER — Ambulatory Visit: Payer: Medicare Other | Admitting: Interventional Cardiology

## 2018-11-10 ENCOUNTER — Encounter: Payer: Self-pay | Admitting: Cardiology

## 2018-11-10 ENCOUNTER — Ambulatory Visit (INDEPENDENT_AMBULATORY_CARE_PROVIDER_SITE_OTHER): Payer: Medicare Other | Admitting: Cardiology

## 2018-11-10 VITALS — BP 156/82 | HR 55 | Ht 70.0 in | Wt 193.0 lb

## 2018-11-10 DIAGNOSIS — I4819 Other persistent atrial fibrillation: Secondary | ICD-10-CM | POA: Diagnosis not present

## 2018-11-10 NOTE — Patient Instructions (Signed)
Medication Instructions:    Your physician recommends that you continue on your current medications as directed. Please refer to the Current Medication list given to you today.  - If you need a refill on your cardiac medications before your next appointment, please call your pharmacy.   Labwork:  None ordered  Testing/Procedures:  None ordered  Follow-Up:  Your physician recommends that you schedule a follow-up appointment in: 3 months with Dr. Camnitz.  Thank you for choosing [facility]!!   Kalin Kyler, RN [phone]         

## 2018-11-10 NOTE — Progress Notes (Signed)
Electrophysiology Office Note   Date:  11/10/2018   ID:  Jason Santana, DOB [birth year], MRN [id number]  PCP:  [name], MD  Cardiologist:  Tamala Julian Primary Electrophysiologist:  Klaus Casteneda Meredith Leeds, MD    No chief complaint on file.    History of Present Illness: Jason Santana is a 85 y.o. male who is being seen today for the evaluation of atrial fibrillation at the request of Belva Crome, MD. Presenting today for electrophysiology evaluation.  He has a history of mechanical aortic valve replacement in 2000, atrial fibrillation and atrial flutter, coronary artery disease.  He had a GI bleed May 2019 requiring a pause and anticoagulation.  He has had 3 cardioversions over the past 6 months, most recently October 22, 2018.  He is currently on amiodarone which has been increased to 400 mg daily.  Symptoms with atrial fibrillation are lightheadedness and fatigue.  Amiodarone was started December 2019.  Today, he denies symptoms of palpitations, chest pain, shortness of breath, orthopnea, PND, lower extremity edema, claudication, dizziness, presyncope, syncope, bleeding, or neurologic sequela. The patient is tolerating medications without difficulties.    Past Medical History:  Diagnosis Date  . Atrial fibrillation ([place])   . CAD (coronary artery disease) of artery bypass graft 2000   2000 with multiple bypasses   . Chronic anticoagulation    Coumadin therapy for mechanical aortic valve. Postoperative atrial fibrillation.   . Essential hypertension   . H/O mechanical aortic valve replacement 2000   St Jude AVR  . Hypothyroidism   . Premature ventricular contractions    Past Surgical History:  Procedure Laterality Date  . AORTIC VALVE REPLACEMENT (AVR)/CORONARY ARTERY BYPASS GRAFTING (CABG)  2000  . CARDIOVERSION N/A 02/24/2018   Procedure: CARDIOVERSION;  Surgeon: Fay Records, MD;  Location: [facility] ENDOSCOPY;  Service: Cardiovascular;  Laterality: N/A;  . CARDIOVERSION N/A  08/15/2018   Procedure: CARDIOVERSION;  Surgeon: Thayer Headings, MD;  Location: [place];  Service: Cardiovascular;  Laterality: N/A;  . COLONOSCOPY WITH PROPOFOL N/A 01/08/2018   Procedure: COLONOSCOPY WITH PROPOFOL;  Surgeon: Arta Silence, MD;  Location: [facility] ENDOSCOPY;  Service: Gastroenterology;  Laterality: N/A;  . CORONARY/GRAFT ANGIOGRAPHY N/A 08/14/2018   Procedure: CORONARY/GRAFT ANGIOGRAPHY;  Surgeon: [name], MD;  Location: [place] CV LAB;  Service: Cardiovascular;  Laterality: N/A;  . ESOPHAGOGASTRODUODENOSCOPY (EGD) WITH PROPOFOL N/A 01/06/2018   Procedure: ESOPHAGOGASTRODUODENOSCOPY (EGD) WITH PROPOFOL;  Surgeon: Otis Brace, MD;  Location: [facility] ENDOSCOPY;  Service: Gastroenterology;  Laterality: N/A;     Current Outpatient Medications  Medication Sig Dispense Refill  . amiodarone (PACERONE) 400 MG tablet Take 1 tablet (400 mg total) by mouth daily for 30 days. 30 tablet 0  . Cholecalciferol (VITAMIN D) 2000 units tablet Take 2,000 Units by mouth daily.    [name] COUMADIN 5 MG tablet Take 5-7.5 mg by mouth See admin instructions. Take 5 mg by mouth once daily on all days except on Monday and Friday taking 7.5mg .    . diphenhydramine-acetaminophen (TYLENOL PM) 25-500 MG TABS tablet Take 0.5 tablets by mouth at bedtime as needed (sleep).    . ferrous sulfate 325 (65 FE) MG tablet Take 325 mg by mouth daily with supper.    . finasteride (PROSCAR) 5 MG tablet Take 5 mg by mouth at bedtime.     [name] levothyroxine (SYNTHROID) 100 MCG tablet Take 1 tablet (100 mcg total) by mouth daily before breakfast. 30 tablet 2  . metoprolol tartrate (LOPRESSOR) 25 MG tablet Take  1 tablet (25 mg total) by mouth 2 (two) times daily. 60 tablet 0  . Multiple Vitamins-Minerals (PRESERVISION AREDS 2) CAPS Take 1 capsule by mouth 2 (two) times daily.    . pantoprazole (PROTONIX) 40 MG tablet Take 1 tablet (40 mg total) by mouth 2 (two) times daily. 60 tablet 0  . Polyethyl Glycol-Propyl Glycol  (SYSTANE OP) Place 1 drop into both eyes daily as needed (dry eyes).    . rosuvastatin (CRESTOR) 20 MG tablet Take 5 mg by mouth daily.     . tamsulosin (FLOMAX) 0.4 MG CAPS capsule Take 0.4 mg by mouth at bedtime.     [name] tobramycin (TOBREX) 0.3 % ophthalmic solution Place 1 drop into the left eye See admin instructions. Instill 1 drop into left eye 1 day before, the day of, and the day after eye injections administered at Dr's office    . traMADol (ULTRAM) 50 MG tablet Take 1 tablet (50 mg total) by mouth every 6 (six) hours as needed for moderate pain. 30 tablet 0   No current facility-administered medications for this visit.     Allergies:   Vicodin [hydrocodone-acetaminophen]; Zocor [simvastatin]; Benicar [olmesartan]; Codeine; Lopid [gemfibrozil]; Monopril [fosinopril]; Phenergan [promethazine hcl]; and Prilosec [omeprazole]   Social History:  The patient  reports that he has quit smoking. His smoking use included cigarettes. He has never used smokeless tobacco. He reports that he does not drink alcohol or use drugs.   Family History:  The patient's family history includes Heart attack in his mother; Hypertension in his father and mother.    ROS:  Please see the history of present illness.   Otherwise, review of systems is positive for weight gain, difficulty urinating.   All other systems are reviewed and negative.    PHYSICAL EXAM: VS:  BP (!) 156/82   Pulse (!) 55   Ht 5\' 10"  (1.778 m)   Wt 193 lb (87.5 kg)   BMI 27.69 kg/m  , BMI Body mass index is 27.69 kg/m. GEN: Well nourished, well developed, in no acute distress  HEENT: normal  Neck: no JVD, carotid bruits, or masses Cardiac: RRR; no murmurs, rubs, or gallops,no edema  Respiratory:  clear to auscultation bilaterally, normal work of breathing GI: soft, nontender, nondistended, + BS MS: no deformity or atrophy  Skin: warm and dry Neuro:  Strength and sensation are intact Psych: euthymic mood, full affect  EKG:  EKG is  not ordered today. Personal review of the ekg ordered 10/28/17 shows sinus rhythm, LVH  Recent Labs: 10/22/2018: ALT 53; B Natriuretic Peptide 242.5; BUN 10; Creatinine, Ser 1.35; Hemoglobin 15.9; Magnesium 1.9; Platelets 175; Potassium 4.4; Sodium 138; TSH 18.080    Lipid Panel     Component Value Date/Time   CHOL 199 08/12/2018 0225   TRIG 241 (H) 08/12/2018 0225   HDL 26 (L) 08/12/2018 0225   CHOLHDL 7.7 08/12/2018 0225   VLDL 48 (H) 08/12/2018 0225   LDLCALC 125 (H) 08/12/2018 0225     Wt Readings from Last 3 Encounters:  11/10/18 193 lb (87.5 kg)  10/28/18 192 lb 6.4 oz (87.3 kg)  10/22/18 181 lb (82.1 kg)      Other studies Reviewed: Additional studies/ records that were reviewed today include: TTE 08/12/18  Review of the above records today demonstrates:  - Left ventricle: The cavity size was normal. There was mild   concentric hypertrophy. Systolic function was normal. The   estimated ejection fraction was in the range of 55%  to 60%. Wall   motion was normal; there were no regional wall motion   abnormalities. - Ventricular septum: Septal motion showed paradox. These changes   are consistent with a post-thoracotomy state. - Aortic valve: A mechanical prosthesis was present and functioning   normally. - Mitral valve: Calcified annulus. There was mild regurgitation. - Left atrium: The atrium was mildly dilated. - Right ventricle: Systolic function was mildly to moderately   reduced. - Right atrium: The atrium was mildly dilated.  LHC 08/14/18  Mid RCA lesion is 100% stenosed.  Ost 2nd Mrg lesion is 100% stenosed.  Prox LAD to Mid LAD lesion is 50% stenosed.  SVG.  The graft exhibits mild .  1.  Severe two-vessel coronary artery disease with total occlusion of the OM 2 and the mid RCA, nonobstructive LAD stenosis 2.  Status post remote aortocoronary bypass surgery with continued patency of the LIMA to LAD, saphenous vein graft OM 2, and saphenous vein graft  to PDA  ASSESSMENT AND PLAN:  1.  Persistent atrial fibrillation: Currently on amiodarone and warfarin.  He has been put on a higher dose of amiodarone since his most recent cardioversion.  He is remained in sinus rhythm.  He is on 400 mg daily until March 19.  At that point, I have told him to go back down to 200 mg a day.  We Orlena Garmon see how he does.  Should he have more rapid atrial fibrillation, he may require pacemaker and AV node ablation, though would certainly prefer to avoid that.  I Tyesha Joffe see him back in 3 months.  This patients CHA2DS2-VASc Score and unadjusted Ischemic Stroke Rate (% per year) is equal to 4.8 % stroke rate/year from a score of 4  Above score calculated as 1 point each if present [CHF, HTN, DM, Vascular=MI/PAD/Aortic Plaque, Age if 65-74, or Male] Above score calculated as 2 points each if present [Age > 75, or Stroke/TIA/TE]  2.  Mechanical aortic valve: Stable on most recent echo.  Plan per primary cardiology.  3.  Coronary artery disease: No current ischemic symptoms  Case discussed with primary cardiology  Current medicines are reviewed at length with the patient today.   The patient does not have concerns regarding his medicines.  The following changes were made today:  none  Labs/ tests ordered today include:  No orders of the defined types were placed in this encounter.    Disposition:   FU with Ceceilia Cephus 3 months  Signed, Zakry Caso Meredith Leeds, MD  11/10/2018 10:06 AM     [facility] [address] [place] [address] [phone] (office) [phone] (fax)

## 2018-11-11 ENCOUNTER — Other Ambulatory Visit: Payer: Self-pay

## 2018-11-11 ENCOUNTER — Encounter (HOSPITAL_COMMUNITY): Payer: Self-pay

## 2018-11-11 ENCOUNTER — Emergency Department (HOSPITAL_COMMUNITY)
Admission: EM | Admit: 2018-11-11 | Discharge: 2018-11-11 | Disposition: A | Discharge status: Home / Self Care | Payer: Medicare Other | Attending: Emergency Medicine | Admitting: Emergency Medicine

## 2018-11-11 DIAGNOSIS — Z7901 Long term (current) use of anticoagulants: Secondary | ICD-10-CM | POA: Diagnosis not present

## 2018-11-11 DIAGNOSIS — I4891 Unspecified atrial fibrillation: Secondary | ICD-10-CM | POA: Insufficient documentation

## 2018-11-11 DIAGNOSIS — I10 Essential (primary) hypertension: Secondary | ICD-10-CM | POA: Diagnosis not present

## 2018-11-11 DIAGNOSIS — I25110 Atherosclerotic heart disease of native coronary artery with unstable angina pectoris: Secondary | ICD-10-CM | POA: Insufficient documentation

## 2018-11-11 DIAGNOSIS — Z79899 Other long term (current) drug therapy: Secondary | ICD-10-CM | POA: Insufficient documentation

## 2018-11-11 DIAGNOSIS — R002 Palpitations: Secondary | ICD-10-CM

## 2018-11-11 DIAGNOSIS — E039 Hypothyroidism, unspecified: Secondary | ICD-10-CM | POA: Insufficient documentation

## 2018-11-11 DIAGNOSIS — Z87891 Personal history of nicotine dependence: Secondary | ICD-10-CM | POA: Diagnosis not present

## 2018-11-11 DIAGNOSIS — R000 Tachycardia, unspecified: Secondary | ICD-10-CM | POA: Diagnosis not present

## 2018-11-11 NOTE — ED Triage Notes (Signed)
Pt arrives from home via [facility] for chest palpitations x3-4 hours and hypertension. Pt denies current palpitations. HR with EMS 58, initial BP of 211/96. Pt has hx of HTN, denies missing any medication. Pt has significant cardiac hx including bypass, MI, and cath. Pt alert, oriented, and ambulatory and denying pain on arrival.

## 2018-11-11 NOTE — ED Notes (Signed)
Patient verbalizes understanding of discharge instructions. Opportunity for questioning and answers were provided. Armband removed by staff, pt discharged from ED. Pt ambulatory to lobby and taken home with family. Follow up care reviewed.

## 2018-11-11 NOTE — ED Provider Notes (Signed)
[place] EMERGENCY DEPARTMENT Provider Note   CSN: [id number] Arrival date & time: 11/11/18  2019    History   Chief Complaint Chief Complaint  Patient presents with  . Palpitations  . Hypertension    HPI Jason Santana is a 85 y.o. male.     The history is provided by the patient.  Palpitations  Palpitations quality:  Fast Onset quality:  At rest Timing:  Constant Progression:  Unchanged Chronicity:  Recurrent Context comment:  Hx of afib Relieved by:  Nothing Worsened by:  Nothing Associated symptoms: no back pain, no chest pain, no chest pressure, no cough, no diaphoresis, no dizziness, no hemoptysis, no leg pain, no lower extremity edema, no malaise/fatigue, no nausea, no near-syncope, no orthopnea, no PND, no shortness of breath, no syncope and no vomiting   Risk factors: hx of atrial fibrillation     Past Medical History:  Diagnosis Date  . Atrial fibrillation ([place])   . CAD (coronary artery disease) of artery bypass graft 2000   2000 with multiple bypasses   . Chronic anticoagulation    Coumadin therapy for mechanical aortic valve. Postoperative atrial fibrillation.   . Essential hypertension   . H/O mechanical aortic valve replacement 2000   St Jude AVR  . Hypothyroidism   . Premature ventricular contractions     Patient Active Problem List   Diagnosis Date Noted  . Chest pain 08/11/2018  . Atypical atrial flutter ([place]) 02/07/2018  . Syncope 01/04/2018  . Coronary artery disease involving native coronary artery of native heart with unstable angina pectoris ([place]) 01/04/2018  . GI bleed 01/03/2018  . Macular degeneration of left eye 05/26/2017  . Hypothyroidism 05/26/2017  . Hyponatremia 05/25/2017  . Sepsis ([place]) 05/25/2017  . Muscle weakness 12/13/2014  . Paroxysmal atrial flutter ([place]) 11/25/2013  . Essential hypertension 11/25/2013  . H/O mechanical aortic valve replacement 11/25/2013  . CAD (coronary artery disease) of  artery bypass graft 11/25/2013  . Chronic anticoagulation 11/25/2013  . Premature ventricular contractions 11/25/2013    Past Surgical History:  Procedure Laterality Date  . AORTIC VALVE REPLACEMENT (AVR)/CORONARY ARTERY BYPASS GRAFTING (CABG)  2000  . CARDIOVERSION N/A 02/24/2018   Procedure: CARDIOVERSION;  Surgeon: Fay Records, MD;  Location: [facility] ENDOSCOPY;  Service: Cardiovascular;  Laterality: N/A;  . CARDIOVERSION N/A 08/15/2018   Procedure: CARDIOVERSION;  Surgeon: Thayer Headings, MD;  Location: [place];  Service: Cardiovascular;  Laterality: N/A;  . COLONOSCOPY WITH PROPOFOL N/A 01/08/2018   Procedure: COLONOSCOPY WITH PROPOFOL;  Surgeon: Arta Silence, MD;  Location: [facility] ENDOSCOPY;  Service: Gastroenterology;  Laterality: N/A;  . CORONARY/GRAFT ANGIOGRAPHY N/A 08/14/2018   Procedure: CORONARY/GRAFT ANGIOGRAPHY;  Surgeon: [name], MD;  Location: [place] CV LAB;  Service: Cardiovascular;  Laterality: N/A;  . ESOPHAGOGASTRODUODENOSCOPY (EGD) WITH PROPOFOL N/A 01/06/2018   Procedure: ESOPHAGOGASTRODUODENOSCOPY (EGD) WITH PROPOFOL;  Surgeon: Otis Brace, MD;  Location: [facility] ENDOSCOPY;  Service: Gastroenterology;  Laterality: N/A;        Home Medications    Prior to Admission medications   Medication Sig Start Date End Date Taking? Authorizing Provider  amiodarone (PACERONE) 400 MG tablet Take 1 tablet (400 mg total) by mouth daily for 30 days. 10/22/18 11/21/18  Caccavale, Sophia, PA-C  Cholecalciferol (VITAMIN D) 2000 units tablet Take 2,000 Units by mouth daily.    [provider]  COUMADIN 5 MG tablet Take 5-7.5 mg by mouth See admin instructions. Take 5 mg by mouth once daily on all days  except on Monday and Friday taking 7.5mg . 09/18/13   [provider]  diphenhydramine-acetaminophen (TYLENOL PM) 25-500 MG TABS tablet Take 0.5 tablets by mouth at bedtime as needed (sleep).    [provider]  ferrous sulfate 325 (65 FE) MG tablet  Take 325 mg by mouth daily with supper.    [provider]  finasteride (PROSCAR) 5 MG tablet Take 5 mg by mouth at bedtime.  10/07/17   [provider]  levothyroxine (SYNTHROID) 100 MCG tablet Take 1 tablet (100 mcg total) by mouth daily before breakfast. 10/28/18   Belva Crome, MD  metoprolol tartrate (LOPRESSOR) 25 MG tablet Take 1 tablet (25 mg total) by mouth 2 (two) times daily. 08/18/18   Amin, Jeanella Flattery, MD  Multiple Vitamins-Minerals (PRESERVISION AREDS 2) CAPS Take 1 capsule by mouth 2 (two) times daily.    [provider]  pantoprazole (PROTONIX) 40 MG tablet Take 1 tablet (40 mg total) by mouth 2 (two) times daily. 01/08/18   Raiford Noble Latif, DO  Polyethyl Glycol-Propyl Glycol (SYSTANE OP) Place 1 drop into both eyes daily as needed (dry eyes).    [provider]  rosuvastatin (CRESTOR) 20 MG tablet Take 5 mg by mouth daily.     [provider]  tamsulosin (FLOMAX) 0.4 MG CAPS capsule Take 0.4 mg by mouth at bedtime.     [provider]  tobramycin (TOBREX) 0.3 % ophthalmic solution Place 1 drop into the left eye See admin instructions. Instill 1 drop into left eye 1 day before, the day of, and the day after eye injections administered at Dr's office    [provider]  traMADol (ULTRAM) 50 MG tablet Take 1 tablet (50 mg total) by mouth every 6 (six) hours as needed for moderate pain. 08/29/18   Isaiah Serge, NP    Family History Family History  Problem Relation Age of Onset  . Hypertension Mother   . Heart attack Mother   . Hypertension Father     Social History Social History   Tobacco Use  . Smoking status: Former Smoker    Types: Cigarettes  . Smokeless tobacco: Never Used  . Tobacco comment: quit 57 years ago  Substance Use Topics  . Alcohol use: No  . Drug use: No     Allergies   Vicodin [hydrocodone-acetaminophen]; Zocor [simvastatin]; Benicar [olmesartan]; Codeine; Lopid [gemfibrozil];  Monopril [fosinopril]; Phenergan [promethazine hcl]; and Prilosec [omeprazole]   Review of Systems Review of Systems  Constitutional: Negative for chills, diaphoresis, fever and malaise/fatigue.  HENT: Negative for ear pain and sore throat.   Eyes: Negative for pain and visual disturbance.  Respiratory: Negative for cough, hemoptysis and shortness of breath.   Cardiovascular: Positive for palpitations. Negative for chest pain, orthopnea, syncope, PND and near-syncope.  Gastrointestinal: Negative for abdominal pain, nausea and vomiting.  Genitourinary: Negative for dysuria and hematuria.  Musculoskeletal: Negative for arthralgias and back pain.  Skin: Negative for color change and rash.  Neurological: Negative for dizziness, seizures and syncope.  All other systems reviewed and are negative.    Physical Exam Updated Vital Signs BP (!) 190/94   Resp 18   Physical Exam Vitals signs and nursing note reviewed.  Constitutional:      Appearance: He is well-developed.  HENT:     Head: Normocephalic and atraumatic.     Nose: Nose normal.     Mouth/Throat:     Mouth: Mucous membranes are moist.  Eyes:  Extraocular Movements: Extraocular movements intact.     Conjunctiva/sclera: Conjunctivae normal.     Pupils: Pupils are equal, round, and reactive to light.  Neck:     Musculoskeletal: Normal range of motion and neck supple.  Cardiovascular:     Rate and Rhythm: Regular rhythm. Bradycardia present.     Pulses: Normal pulses.     Heart sounds: Normal heart sounds. No murmur.  Pulmonary:     Effort: Pulmonary effort is normal. No respiratory distress.     Breath sounds: Normal breath sounds.  Abdominal:     General: There is no distension.     Palpations: Abdomen is soft.     Tenderness: There is no abdominal tenderness.  Skin:    General: Skin is warm and dry.  Neurological:     General: No focal deficit present.     Mental Status: He is alert and oriented to person,  place, and time.     Cranial Nerves: No cranial nerve deficit.     Sensory: No sensory deficit.     Motor: No weakness.     Coordination: Coordination normal.     Gait: Gait normal.     Comments: 5+ out of 5 strength throughout, normal sensation, no drift, normal speech  Psychiatric:        Mood and Affect: Mood normal.      ED Treatments / Results  Labs (all labs ordered are listed, but only abnormal results are displayed) Labs Reviewed - No data to display  EKG EKG Interpretation  Date/Time:  Tuesday November 11 2018 20:43:53 EDT Ventricular Rate:  55 PR Interval:  160 QRS Duration: 108 QT Interval:  496 QTC Calculation: 474 R Axis:   -63 Text Interpretation:  Sinus bradycardia Left anterior fascicular block Left ventricular hypertrophy Nonspecific ST abnormality Abnormal ECG Confirmed by Lennice Sites [phone]) on 11/11/2018 9:12:51 PM   Radiology No results found.  Procedures Procedures (including critical care time)  Medications Ordered in ED Medications - No data to display   Initial Impression / Assessment and Plan / ED Course  I have reviewed the triage vital signs and the nursing notes.  Pertinent labs & imaging results that were available during my care of the patient were reviewed by me and considered in my medical decision making (see chart for details).     Jason Santana is an 85 year old male with history of atrial fibrillation, CAD, hypertension who presents to the ED with palpitations.  Patient with overall unremarkable vitals except for mild hypertension.  Patient states that he has felt palpitations for the last several hours.  However, EKG shows sinus bradycardia.  Patient denies any chest pain, shortness of breath.  No strokelike symptoms.  Has normal neurological exam.  Is overall comfortable.  Overall there is no concern for atrial fibrillation with RVR.  Patient has required multiple cardioversions over the last several months.  He had a recent change  in his amiodarone dose.  He has been compliant with his Coumadin.  Overall possible that patient might be having PVCs which he also has a history of.  At this time no concern for RVR and patient given reassurance and discharged from ED in good condition.  This chart was dictated using voice recognition software.  Despite best efforts to proofread,  errors can occur which can change the documentation meaning.    Final Clinical Impressions(s) / ED Diagnoses   Final diagnoses:  Palpitations    ED Discharge Orders  None       Lennice Sites, DO 11/11/18 2206

## 2018-11-11 NOTE — ED Notes (Signed)
ED Provider at bedside. 

## 2018-11-12 ENCOUNTER — Telehealth: Payer: Self-pay

## 2018-11-12 DIAGNOSIS — Z7901 Long term (current) use of anticoagulants: Secondary | ICD-10-CM | POA: Diagnosis not present

## 2018-11-12 MED ORDER — AMIODARONE HCL 200 MG PO TABS: 200.0000 mg | ORAL_TABLET | Freq: Every day | ORAL | 3 refills | Status: DC

## 2018-11-12 NOTE — Telephone Encounter (Signed)
Patient walked in to ask about his Amiodarone. Informed patient that he should be taking just amiodarone 400 mg daily until March 19th, then switch to amiodarone 200 mg daily, per Dr. Macky Lower office note on 11/10/18. Patient stated he has enough last him until 11/20/18, but he will need a refill for the 200 mg and 90 day supply. Will send refill for amiodarone 200 mg daily to patient's pharmacy of choice.  Patient verbalized understanding.

## 2018-11-18 ENCOUNTER — Institutional Professional Consult (permissible substitution): Payer: Medicare Other | Admitting: Cardiology

## 2018-11-20 DIAGNOSIS — Z79899 Other long term (current) drug therapy: Secondary | ICD-10-CM | POA: Diagnosis not present

## 2018-11-20 DIAGNOSIS — R001 Bradycardia, unspecified: Secondary | ICD-10-CM | POA: Diagnosis not present

## 2018-11-20 DIAGNOSIS — R002 Palpitations: Secondary | ICD-10-CM | POA: Diagnosis not present

## 2018-11-20 DIAGNOSIS — R5383 Other fatigue: Secondary | ICD-10-CM | POA: Diagnosis not present

## 2018-11-20 DIAGNOSIS — Z7901 Long term (current) use of anticoagulants: Secondary | ICD-10-CM | POA: Diagnosis not present

## 2018-11-25 DIAGNOSIS — Z7901 Long term (current) use of anticoagulants: Secondary | ICD-10-CM | POA: Diagnosis not present

## 2018-11-26 ENCOUNTER — Other Ambulatory Visit: Payer: Medicare Other

## 2018-12-23 DIAGNOSIS — Z7901 Long term (current) use of anticoagulants: Secondary | ICD-10-CM | POA: Diagnosis not present

## 2019-01-20 DIAGNOSIS — R7989 Other specified abnormal findings of blood chemistry: Secondary | ICD-10-CM | POA: Diagnosis not present

## 2019-01-20 DIAGNOSIS — E039 Hypothyroidism, unspecified: Secondary | ICD-10-CM | POA: Diagnosis not present

## 2019-01-20 DIAGNOSIS — Z7901 Long term (current) use of anticoagulants: Secondary | ICD-10-CM | POA: Diagnosis not present

## 2019-01-21 DIAGNOSIS — H353222 Exudative age-related macular degeneration, left eye, with inactive choroidal neovascularization: Secondary | ICD-10-CM | POA: Diagnosis not present

## 2019-01-21 DIAGNOSIS — H353111 Nonexudative age-related macular degeneration, right eye, early dry stage: Secondary | ICD-10-CM | POA: Diagnosis not present

## 2019-01-28 ENCOUNTER — Other Ambulatory Visit: Payer: Self-pay

## 2019-01-28 ENCOUNTER — Other Ambulatory Visit: Payer: Medicare Other | Admitting: *Deleted

## 2019-01-28 ENCOUNTER — Encounter (INDEPENDENT_AMBULATORY_CARE_PROVIDER_SITE_OTHER): Payer: Self-pay

## 2019-01-28 DIAGNOSIS — E038 Other specified hypothyroidism: Secondary | ICD-10-CM | POA: Diagnosis not present

## 2019-01-28 LAB — TSH: TSH: 7.58 u[IU]/mL — ABNORMAL HIGH (ref 0.450–4.500)

## 2019-02-04 DIAGNOSIS — R7989 Other specified abnormal findings of blood chemistry: Secondary | ICD-10-CM | POA: Diagnosis not present

## 2019-02-04 DIAGNOSIS — Z7901 Long term (current) use of anticoagulants: Secondary | ICD-10-CM | POA: Diagnosis not present

## 2019-02-12 ENCOUNTER — Telehealth: Payer: Self-pay | Admitting: *Deleted

## 2019-02-12 NOTE — Telephone Encounter (Signed)
   Primary Cardiologist: Sinclair Grooms, MD   Pt contacted.  History and symptoms reviewed.  Pt will f/u with [facility] provider as scheduled.  Pt. advised that we are restricting visitors at this time and request that only patients present for check-in prior to their appointment.  All other visitors should remain in their car.  If necessary, only one visitor may come with the patient, into the building. For everyone's safety, all patients and visitor entering our practice area should expect to be screened again prior to entering our waiting area.  Deirdre Gryder  02/12/2019 9:59 AM     COVID-19 Pre-Screening Questions:  . In the past 7 to 10 days have you had a cough,  shortness of breath, headache, congestion, fever (100 or greater) body aches, chills, sore throat, or sudden loss of taste or sense of smell?  NO . Have you been around anyone with known Covid 19.  NO . Have you been around anyone who is awaiting Covid 19 test results in the past 7 to 10 days?  NO . Have you been around anyone who has been exposed to Covid 19, or has mentioned symptoms of Covid 19 within the past 7 to 10 days?  NO  If you have any concerns/questions about symptoms patients report during screening (either on the phone or at threshold). Contact the provider seeing the patient or DOD for further guidance.  If neither are available contact a member of the leadership team.

## 2019-02-17 ENCOUNTER — Other Ambulatory Visit: Payer: Self-pay

## 2019-02-17 ENCOUNTER — Encounter: Payer: Self-pay | Admitting: Cardiology

## 2019-02-17 ENCOUNTER — Ambulatory Visit (INDEPENDENT_AMBULATORY_CARE_PROVIDER_SITE_OTHER): Payer: Medicare Other | Admitting: Cardiology

## 2019-02-17 VITALS — BP 116/62 | HR 52 | Ht 70.0 in | Wt 184.0 lb

## 2019-02-17 DIAGNOSIS — I25708 Atherosclerosis of coronary artery bypass graft(s), unspecified, with other forms of angina pectoris: Secondary | ICD-10-CM | POA: Diagnosis not present

## 2019-02-17 DIAGNOSIS — I480 Paroxysmal atrial fibrillation: Secondary | ICD-10-CM

## 2019-02-17 NOTE — Progress Notes (Signed)
Electrophysiology Office Note   Date:  02/17/2019   ID:  Jason Santana, DOB November 25, 1931, MRN [id number]  PCP:  [name], MD  Cardiologist:  Tamala Julian Primary Electrophysiologist:  Will Meredith Leeds, MD    CC: Paroxysmal Afib   History of Present Illness: Jason Santana is a 85 y.o. male who is being seen today for the evaluation of atrial fibrillation at the request of [name], MD. Presenting today for electrophysiology evaluation.  He has a history of mechanical aortic valve replacement in 2000, atrial fibrillation and atrial flutter, coronary artery disease.  He had a GI bleed May 2019 requiring a pause and anticoagulation.  He has had 3 cardioversions over the past 9 months, most recently October 22, 2018.  He is currently on amiodarone at 200 mg daily (decreased 11/2018)   He presents today for 3 month follow up. Since last visit he has been feeling good overall. He denies any symptoms of afib, of which he is always aware. His only complaint is occasional leg weakness that he has had in the past with his crestor. He denies bleeding on coumadin.   Today, he denies symptoms of palpitations, chest pain, shortness of breath, orthopnea, PND, lower extremity edema, claudication, dizziness, presyncope, syncope, or neurologic sequela.    Past Medical History:  Diagnosis Date  . Atrial fibrillation ([place])   . CAD (coronary artery disease) of artery bypass graft 2000   2000 with multiple bypasses   . Chronic anticoagulation    Coumadin therapy for mechanical aortic valve. Postoperative atrial fibrillation.   . Essential hypertension   . H/O mechanical aortic valve replacement 2000   St Jude AVR  . Hypothyroidism   . Premature ventricular contractions    Past Surgical History:  Procedure Laterality Date  . AORTIC VALVE REPLACEMENT (AVR)/CORONARY ARTERY BYPASS GRAFTING (CABG)  2000  . CARDIOVERSION N/A 02/24/2018   Procedure: CARDIOVERSION;  Surgeon: Fay Records, MD;  Location:  [facility] ENDOSCOPY;  Service: Cardiovascular;  Laterality: N/A;  . CARDIOVERSION N/A 08/15/2018   Procedure: CARDIOVERSION;  Surgeon: Thayer Headings, MD;  Location: [place];  Service: Cardiovascular;  Laterality: N/A;  . COLONOSCOPY WITH PROPOFOL N/A 01/08/2018   Procedure: COLONOSCOPY WITH PROPOFOL;  Surgeon: Arta Silence, MD;  Location: [facility] ENDOSCOPY;  Service: Gastroenterology;  Laterality: N/A;  . CORONARY/GRAFT ANGIOGRAPHY N/A 08/14/2018   Procedure: CORONARY/GRAFT ANGIOGRAPHY;  Surgeon: [name], MD;  Location: [place] CV LAB;  Service: Cardiovascular;  Laterality: N/A;  . ESOPHAGOGASTRODUODENOSCOPY (EGD) WITH PROPOFOL N/A 01/06/2018   Procedure: ESOPHAGOGASTRODUODENOSCOPY (EGD) WITH PROPOFOL;  Surgeon: Otis Brace, MD;  Location: [facility] ENDOSCOPY;  Service: Gastroenterology;  Laterality: N/A;     Current Outpatient Medications  Medication Sig Dispense Refill  . amiodarone (PACERONE) 200 MG tablet Take 1 tablet (200 mg total) by mouth daily. 90 tablet 3  . Cholecalciferol (VITAMIN D) 2000 units tablet Take 2,000 Units by mouth daily.    [name] COUMADIN 5 MG tablet Take 5-7.5 mg by mouth daily at 6 PM.     . diphenhydramine-acetaminophen (TYLENOL PM) 25-500 MG TABS tablet Take 0.5 tablets by mouth at bedtime as needed (sleep).    . ferrous sulfate 325 (65 FE) MG tablet Take 325 mg by mouth daily with supper.    . finasteride (PROSCAR) 5 MG tablet Take 5 mg by mouth at bedtime.     [name] levothyroxine (SYNTHROID) 125 MCG tablet Take 125 mcg by mouth daily.    . Multiple Vitamins-Minerals (PRESERVISION AREDS 2) CAPS  Take 1 capsule by mouth 2 (two) times daily.    . pantoprazole (PROTONIX) 40 MG tablet Take 1 tablet (40 mg total) by mouth 2 (two) times daily. 60 tablet 0  . Polyethyl Glycol-Propyl Glycol (SYSTANE OP) Place 1 drop into both eyes daily as needed (dry eyes).    . rosuvastatin (CRESTOR) 5 MG tablet Take 5 mg by mouth daily.    . tamsulosin (FLOMAX) 0.4 MG CAPS capsule Take  0.4 mg by mouth at bedtime.     [name] tobramycin (TOBREX) 0.3 % ophthalmic solution Place 1 drop into the left eye See admin instructions. Instill 1 drop into left eye 1 day before, the day of, and the day after eye injections administered at Dr's office    . traMADol (ULTRAM) 50 MG tablet Take 1 tablet (50 mg total) by mouth every 6 (six) hours as needed for moderate pain. 30 tablet 0   No current facility-administered medications for this visit.     Allergies:   Vicodin [hydrocodone-acetaminophen], Zocor [simvastatin], Benicar [olmesartan], Codeine, Lopid [gemfibrozil], Monopril [fosinopril], Phenergan [promethazine hcl], and Prilosec [omeprazole]   Social History:  The patient  reports that he has quit smoking. His smoking use included cigarettes. He has never used smokeless tobacco. He reports that he does not drink alcohol or use drugs.   Family History:  The patient's family history includes Heart attack in his mother; Hypertension in his father and mother.   Review of systems complete and found to be negative unless listed in HPI.    PHYSICAL EXAM: Vitals:   02/17/19 0809  BP: 116/62  Pulse: (!) 52  Weight: 184 lb (83.5 kg)  Height: 5\' 10"  (1.778 m)    GEN- The patient is well appearing, alert and oriented x 3 today.   Head- normocephalic, atraumatic Eyes-  Sclera clear, conjunctiva pink Ears- hearing intact Oropharynx- clear Neck- supple,  Lungs- Clear to ausculation bilaterally, normal work of breathing Heart- Regular rate and rhythm, no murmurs, rubs or gallops, PMI not laterally displaced GI- soft, NT, ND, + BS Extremities- no clubbing, cyanosis, or edema Neuro- strength and sensation are intact   EKG:  EKG is not ordered today. Personal review of the ekg ordered 10/28/17 shows sinus rhythm, LVH  Recent Labs: 10/22/2018: ALT 53; B Natriuretic Peptide 242.5; BUN 10; Creatinine, Ser 1.35; Hemoglobin 15.9; Magnesium 1.9; Platelets 175; Potassium 4.4; Sodium 138 01/28/2019:  TSH 7.580    Lipid Panel     Component Value Date/Time   CHOL 199 08/12/2018 0225   TRIG 241 (H) 08/12/2018 0225   HDL 26 (L) 08/12/2018 0225   CHOLHDL 7.7 08/12/2018 0225   VLDL 48 (H) 08/12/2018 0225   LDLCALC 125 (H) 08/12/2018 0225     Wt Readings from Last 3 Encounters:  02/17/19 184 lb (83.5 kg)  11/10/18 193 lb (87.5 kg)  10/28/18 192 lb 6.4 oz (87.3 kg)      Other studies Reviewed: Additional studies/ records that were reviewed today include: TTE 08/12/18  Review of the above records today demonstrates:  - Left ventricle: The cavity size was normal. There was mild   concentric hypertrophy. Systolic function was normal. The   estimated ejection fraction was in the range of 55% to 60%. Wall   motion was normal; there were no regional wall motion   abnormalities. - Ventricular septum: Septal motion showed paradox. These changes   are consistent with a post-thoracotomy state. - Aortic valve: A mechanical prosthesis was present and functioning  normally. - Mitral valve: Calcified annulus. There was mild regurgitation. - Left atrium: The atrium was mildly dilated. - Right ventricle: Systolic function was mildly to moderately   reduced. - Right atrium: The atrium was mildly dilated.  LHC 08/14/18  Mid RCA lesion is 100% stenosed.  Ost 2nd Mrg lesion is 100% stenosed.  Prox LAD to Mid LAD lesion is 50% stenosed.  SVG.  The graft exhibits mild .  1.  Severe two-vessel coronary artery disease with total occlusion of the OM 2 and the mid RCA, nonobstructive LAD stenosis 2.  Status post remote aortocoronary bypass surgery with continued patency of the LIMA to LAD, saphenous vein graft OM 2, and saphenous vein graft to PDA  ASSESSMENT AND PLAN:  1.  Persistent atrial fibrillation: Remains on amiodarone and warfarin. Has been on amiodarone 200 mg daily since 11/20/18. If burden worsens, he may require AV node ablation + pacing.   This patients CHA2DS2-VASc Score  and unadjusted Ischemic Stroke Rate (% per year) is equal to 4.8 % stroke rate/year from a score of 4  Above score calculated as 1 point each if present [CHF, HTN, DM, Vascular=MI/PAD/Aortic Plaque, Age if 65-74, or Male] Above score calculated as 2 points each if present [Age > 75, or Stroke/TIA/TE]  2.  Mechanical aortic valve: Stable on most recent echo 08/2018. Per primary cardiology  3. Coronary artery disease: No current ischemic symptoms.   4. Bradycardia: Sinus brady at 52 bpm on arrival. This may be contributing to his fatigue. Will stop his lopressor to see if this helps.    Current medicines are reviewed at length with the patient today.   The patient does not have concerns regarding his medicines.  The following changes were made today:  none  Labs/ tests ordered today include:  Orders Placed This Encounter  Procedures  . EKG 12-Lead     Disposition:   FU with Will Camnitz 6 months  Signed, Will Meredith Leeds, MD  02/17/2019 8:40 AM     [facility] [address] [place] [address] [phone] (office) [phone] (fax)  I have seen and examined this patient with Oda Kilts.  Agree with above, note added to reflect my findings.  On exam, RRR, no murmurs, lungs clear.  He is overall feeling well and has had no further episodes of atrial fibrillation.  His amiodarone has since been reduced to 200 mg a day.  He is having some weakness and fatigue.  We will plan to stop his metoprolol as his heart rates are in the 50s.  Will M. Camnitz MD 02/17/2019 8:41 AM

## 2019-02-17 NOTE — Patient Instructions (Addendum)
Medication Instructions:  Your physician has recommended you make the following change in your medication:  1. STOP Lopressor   * If you need a refill on your cardiac medications before your next appointment, please call your pharmacy.   Labwork: None ordered  Testing/Procedures: None ordered  Follow-Up: Your physician wants you to follow-up in: 6 months with Dr. Curt Bears.  You will receive a reminder letter in the mail two months in advance. If you don't receive a letter, please call our office to schedule the follow-up appointment.    Thank you for choosing [facility]!!   Trinidad Curet, RN [phone]

## 2019-03-02 DIAGNOSIS — Z0001 Encounter for general adult medical examination with abnormal findings: Secondary | ICD-10-CM | POA: Diagnosis not present

## 2019-03-02 DIAGNOSIS — R7989 Other specified abnormal findings of blood chemistry: Secondary | ICD-10-CM | POA: Diagnosis not present

## 2019-03-02 DIAGNOSIS — Z1389 Encounter for screening for other disorder: Secondary | ICD-10-CM | POA: Diagnosis not present

## 2019-03-02 DIAGNOSIS — N3281 Overactive bladder: Secondary | ICD-10-CM | POA: Diagnosis not present

## 2019-03-02 DIAGNOSIS — Z8719 Personal history of other diseases of the digestive system: Secondary | ICD-10-CM | POA: Diagnosis not present

## 2019-03-09 DIAGNOSIS — I4891 Unspecified atrial fibrillation: Secondary | ICD-10-CM | POA: Diagnosis not present

## 2019-03-09 DIAGNOSIS — Z7901 Long term (current) use of anticoagulants: Secondary | ICD-10-CM | POA: Diagnosis not present

## 2019-04-06 DIAGNOSIS — R946 Abnormal results of thyroid function studies: Secondary | ICD-10-CM | POA: Diagnosis not present

## 2019-04-06 DIAGNOSIS — Z7901 Long term (current) use of anticoagulants: Secondary | ICD-10-CM | POA: Diagnosis not present

## 2019-04-06 DIAGNOSIS — I4891 Unspecified atrial fibrillation: Secondary | ICD-10-CM | POA: Diagnosis not present

## 2019-04-10 ENCOUNTER — Encounter (HOSPITAL_COMMUNITY): Payer: Self-pay | Admitting: Emergency Medicine

## 2019-04-10 ENCOUNTER — Emergency Department (HOSPITAL_COMMUNITY): Payer: Medicare Other

## 2019-04-10 ENCOUNTER — Emergency Department (HOSPITAL_COMMUNITY)
Admission: EM | Admit: 2019-04-10 | Discharge: 2019-04-10 | Disposition: A | Discharge status: Home / Self Care | Payer: Medicare Other | Attending: Emergency Medicine | Admitting: Emergency Medicine

## 2019-04-10 ENCOUNTER — Other Ambulatory Visit: Payer: Self-pay

## 2019-04-10 DIAGNOSIS — Z7901 Long term (current) use of anticoagulants: Secondary | ICD-10-CM | POA: Diagnosis not present

## 2019-04-10 DIAGNOSIS — E039 Hypothyroidism, unspecified: Secondary | ICD-10-CM | POA: Diagnosis not present

## 2019-04-10 DIAGNOSIS — Z79899 Other long term (current) drug therapy: Secondary | ICD-10-CM | POA: Diagnosis not present

## 2019-04-10 DIAGNOSIS — I25110 Atherosclerotic heart disease of native coronary artery with unstable angina pectoris: Secondary | ICD-10-CM | POA: Insufficient documentation

## 2019-04-10 DIAGNOSIS — I472 Ventricular tachycardia: Secondary | ICD-10-CM

## 2019-04-10 DIAGNOSIS — I4811 Longstanding persistent atrial fibrillation: Secondary | ICD-10-CM | POA: Diagnosis not present

## 2019-04-10 DIAGNOSIS — R000 Tachycardia, unspecified: Secondary | ICD-10-CM | POA: Diagnosis not present

## 2019-04-10 DIAGNOSIS — I10 Essential (primary) hypertension: Secondary | ICD-10-CM | POA: Diagnosis not present

## 2019-04-10 DIAGNOSIS — Z87891 Personal history of nicotine dependence: Secondary | ICD-10-CM | POA: Diagnosis not present

## 2019-04-10 DIAGNOSIS — I4891 Unspecified atrial fibrillation: Secondary | ICD-10-CM | POA: Diagnosis not present

## 2019-04-10 DIAGNOSIS — Z951 Presence of aortocoronary bypass graft: Secondary | ICD-10-CM | POA: Diagnosis not present

## 2019-04-10 DIAGNOSIS — I499 Cardiac arrhythmia, unspecified: Secondary | ICD-10-CM | POA: Diagnosis not present

## 2019-04-10 LAB — POCT I-STAT EG7
Acid-Base Excess: 3 mmol/L — ABNORMAL HIGH (ref 0.0–2.0)
Bicarbonate: 26.2 mmol/L (ref 20.0–28.0)
Calcium, Ion: 1.08 mmol/L — ABNORMAL LOW (ref 1.15–1.40)
HCT: 44 % (ref 39.0–52.0)
Hemoglobin: 15 g/dL (ref 13.0–17.0)
O2 Saturation: 81 %
Potassium: 4 mmol/L (ref 3.5–5.1)
Sodium: 132 mmol/L — ABNORMAL LOW (ref 135–145)
TCO2: 27 mmol/L (ref 22–32)
pCO2, Ven: 35.4 mmHg — ABNORMAL LOW (ref 44.0–60.0)
pH, Ven: 7.477 — ABNORMAL HIGH (ref 7.250–7.430)
pO2, Ven: 42 mmHg (ref 32.0–45.0)

## 2019-04-10 LAB — CBC WITH DIFFERENTIAL/PLATELET
Abs Immature Granulocytes: 0.01 10*3/uL (ref 0.00–0.07)
Basophils Absolute: 0 10*3/uL (ref 0.0–0.1)
Basophils Relative: 0 %
Eosinophils Absolute: 0.3 10*3/uL (ref 0.0–0.5)
Eosinophils Relative: 5 %
HCT: 41.6 % (ref 39.0–52.0)
Hemoglobin: 13.9 g/dL (ref 13.0–17.0)
Immature Granulocytes: 0 %
Lymphocytes Relative: 21 %
Lymphs Abs: 1.1 10*3/uL (ref 0.7–4.0)
MCH: 34.2 pg — ABNORMAL HIGH (ref 26.0–34.0)
MCHC: 33.4 g/dL (ref 30.0–36.0)
MCV: 102.2 fL — ABNORMAL HIGH (ref 80.0–100.0)
Monocytes Absolute: 0.9 10*3/uL (ref 0.1–1.0)
Monocytes Relative: 17 %
Neutro Abs: 2.9 10*3/uL (ref 1.7–7.7)
Neutrophils Relative %: 57 %
Platelets: 227 10*3/uL (ref 150–400)
RBC: 4.07 MIL/uL — ABNORMAL LOW (ref 4.22–5.81)
RDW: 14.6 % (ref 11.5–15.5)
WBC: 5.2 10*3/uL (ref 4.0–10.5)
nRBC: 0 % (ref 0.0–0.2)

## 2019-04-10 LAB — TROPONIN I (HIGH SENSITIVITY): Troponin I (High Sensitivity): 8 ng/L (ref <18)

## 2019-04-10 LAB — COMPREHENSIVE METABOLIC PANEL
ALT: 66 U/L — ABNORMAL HIGH (ref 0–44)
AST: 127 U/L — ABNORMAL HIGH (ref 15–41)
Albumin: 3.6 g/dL (ref 3.5–5.0)
Alkaline Phosphatase: 143 U/L — ABNORMAL HIGH (ref 38–126)
Anion gap: 10 (ref 5–15)
BUN: 12 mg/dL (ref 8–23)
CO2: 22 mmol/L (ref 22–32)
Calcium: 8.9 mg/dL (ref 8.9–10.3)
Chloride: 95 mmol/L — ABNORMAL LOW (ref 98–111)
Creatinine, Ser: 1.33 mg/dL — ABNORMAL HIGH (ref 0.61–1.24)
GFR calc Af Amer: 56 mL/min — ABNORMAL LOW (ref >60)
GFR calc non Af Amer: 49 mL/min — ABNORMAL LOW (ref >60)
Glucose, Bld: 195 mg/dL — ABNORMAL HIGH (ref 70–99)
Potassium: 4 mmol/L (ref 3.5–5.1)
Sodium: 127 mmol/L — ABNORMAL LOW (ref 135–145)
Total Bilirubin: 1.3 mg/dL — ABNORMAL HIGH (ref 0.3–1.2)
Total Protein: 7.7 g/dL (ref 6.5–8.1)

## 2019-04-10 LAB — MAGNESIUM: Magnesium: 1.9 mg/dL (ref 1.7–2.4)

## 2019-04-10 LAB — PROTIME-INR
INR: 2.3 — ABNORMAL HIGH (ref 0.8–1.2)
Prothrombin Time: 25.2 seconds — ABNORMAL HIGH (ref 11.4–15.2)

## 2019-04-10 LAB — I-STAT CREATININE, ED: Creatinine, Ser: 1.2 mg/dL (ref 0.61–1.24)

## 2019-04-10 MED ORDER — ETOMIDATE 2 MG/ML IV SOLN
10.0000 mg | Freq: Once | INTRAVENOUS | Status: DC
  Filled 2019-04-10: qty 10

## 2019-04-10 MED ORDER — ETOMIDATE 2 MG/ML IV SOLN
INTRAVENOUS | Status: AC | PRN
  Administered 2019-04-10: 10 mg via INTRAVENOUS

## 2019-04-10 NOTE — ED Triage Notes (Signed)
Pt BIB [facility] from home, report that he felt his heart beating fast at 2045. Hx V-tach with cardioversion, last cardioversion March 2020. On EMS arrival, pt in [place], with a rate of 160s. Denies chest pain/shortness of breath. Pt A&O x4. Given 150mg  amiodarone IV by EMS.

## 2019-04-10 NOTE — ED Provider Notes (Addendum)
[facility] EMERGENCY DEPARTMENT Provider Note   CSN: [id number] Arrival date & time: 04/10/19  2149    History   Chief Complaint Chief Complaint  Patient presents with  . V-Tach    HPI Jason Santana is a 85 y.o. male hx of afib on coumadin, CABG status post bypass, mechanical valve on Coumadin, here with palpitations. Patient states that around 9 pm today, he was using the bathroom as sudden onset of palpitations.  He states that he can hear his heart rate in his right ear.  He states that he has a history of recurrent V. tach that required cardioversion in the past.  He is on Coumadin and has not missed any doses and states that his INR was therapeutic about a week ago. He received 150 mg of amiodarone from EMS. EMS was concerned for recurrent V tach.      The history is provided by the patient.    Past Medical History:  Diagnosis Date  . Atrial fibrillation ([place])   . CAD (coronary artery disease) of artery bypass graft 2000   2000 with multiple bypasses   . Chronic anticoagulation    Coumadin therapy for mechanical aortic valve. Postoperative atrial fibrillation.   . Essential hypertension   . H/O mechanical aortic valve replacement 2000   St Jude AVR  . Hypothyroidism   . Premature ventricular contractions     Patient Active Problem List   Diagnosis Date Noted  . Chest pain 08/11/2018  . Atypical atrial flutter ([place]) 02/07/2018  . Syncope 01/04/2018  . Coronary artery disease involving native coronary artery of native heart with unstable angina pectoris ([place]) 01/04/2018  . GI bleed 01/03/2018  . Macular degeneration of left eye 05/26/2017  . Hypothyroidism 05/26/2017  . Hyponatremia 05/25/2017  . Sepsis ([place]) 05/25/2017  . Muscle weakness 12/13/2014  . Paroxysmal atrial flutter ([place]) 11/25/2013  . Essential hypertension 11/25/2013  . H/O mechanical aortic valve replacement 11/25/2013  . CAD (coronary artery disease) of artery bypass graft  11/25/2013  . Chronic anticoagulation 11/25/2013  . Premature ventricular contractions 11/25/2013    Past Surgical History:  Procedure Laterality Date  . AORTIC VALVE REPLACEMENT (AVR)/CORONARY ARTERY BYPASS GRAFTING (CABG)  2000  . CARDIOVERSION N/A 02/24/2018   Procedure: CARDIOVERSION;  Surgeon: Fay Records, MD;  Location: [facility] ENDOSCOPY;  Service: Cardiovascular;  Laterality: N/A;  . CARDIOVERSION N/A 08/15/2018   Procedure: CARDIOVERSION;  Surgeon: Thayer Headings, MD;  Location: [place];  Service: Cardiovascular;  Laterality: N/A;  . COLONOSCOPY WITH PROPOFOL N/A 01/08/2018   Procedure: COLONOSCOPY WITH PROPOFOL;  Surgeon: Arta Silence, MD;  Location: [facility] ENDOSCOPY;  Service: Gastroenterology;  Laterality: N/A;  . CORONARY/GRAFT ANGIOGRAPHY N/A 08/14/2018   Procedure: CORONARY/GRAFT ANGIOGRAPHY;  Surgeon: [name], MD;  Location: [place] CV LAB;  Service: Cardiovascular;  Laterality: N/A;  . ESOPHAGOGASTRODUODENOSCOPY (EGD) WITH PROPOFOL N/A 01/06/2018   Procedure: ESOPHAGOGASTRODUODENOSCOPY (EGD) WITH PROPOFOL;  Surgeon: Otis Brace, MD;  Location: [facility] ENDOSCOPY;  Service: Gastroenterology;  Laterality: N/A;        Home Medications    Prior to Admission medications   Medication Sig Start Date End Date Taking? Authorizing Provider  amiodarone (PACERONE) 200 MG tablet Take 1 tablet (200 mg total) by mouth daily. 11/20/18   Camnitz, Ocie Doyne, MD  Cholecalciferol (VITAMIN D) 2000 units tablet Take 2,000 Units by mouth daily.    [provider]  COUMADIN 5 MG tablet Take 5-7.5 mg by mouth daily at 6 PM.  09/18/13   [provider]  diphenhydramine-acetaminophen (TYLENOL PM) 25-500 MG TABS tablet Take 0.5 tablets by mouth at bedtime as needed (sleep).    [provider]  ferrous sulfate 325 (65 FE) MG tablet Take 325 mg by mouth daily with supper.    [provider]  finasteride (PROSCAR) 5 MG tablet Take 5 mg by mouth at  bedtime.  10/07/17   [provider]  levothyroxine (SYNTHROID) 125 MCG tablet Take 125 mcg by mouth daily. 11/21/18   [provider]  Multiple Vitamins-Minerals (PRESERVISION AREDS 2) CAPS Take 1 capsule by mouth 2 (two) times daily.    [provider]  pantoprazole (PROTONIX) 40 MG tablet Take 1 tablet (40 mg total) by mouth 2 (two) times daily. 01/08/18   Raiford Noble Latif, DO  Polyethyl Glycol-Propyl Glycol (SYSTANE OP) Place 1 drop into both eyes daily as needed (dry eyes).    [provider]  rosuvastatin (CRESTOR) 5 MG tablet Take 5 mg by mouth daily. 11/22/18   [provider]  tamsulosin (FLOMAX) 0.4 MG CAPS capsule Take 0.4 mg by mouth at bedtime.     [provider]  tobramycin (TOBREX) 0.3 % ophthalmic solution Place 1 drop into the left eye See admin instructions. Instill 1 drop into left eye 1 day before, the day of, and the day after eye injections administered at Dr's office    [provider]  traMADol (ULTRAM) 50 MG tablet Take 1 tablet (50 mg total) by mouth every 6 (six) hours as needed for moderate pain. 08/29/18   Isaiah Serge, NP    Family History Family History  Problem Relation Age of Onset  . Hypertension Mother   . Heart attack Mother   . Hypertension Father     Social History Social History   Tobacco Use  . Smoking status: Former Smoker    Types: Cigarettes  . Smokeless tobacco: Never Used  . Tobacco comment: quit 57 years ago  Substance Use Topics  . Alcohol use: No  . Drug use: No     Allergies   Vicodin [hydrocodone-acetaminophen], Zocor [simvastatin], Benicar [olmesartan], Codeine, Lopid [gemfibrozil], Monopril [fosinopril], Phenergan [promethazine hcl], and Prilosec [omeprazole]   Review of Systems Review of Systems  Cardiovascular: Positive for palpitations.  All other systems reviewed and are negative.    Physical Exam Updated Vital Signs BP (!) 157/71   Pulse 69   Temp  (!) 97.3 F (36.3 C) (Tympanic)   Resp (!) 24   SpO2 96%   Physical Exam Vitals signs and nursing note reviewed.  HENT:     Head: Normocephalic.     Right Ear: Tympanic membrane normal.     Left Ear: Tympanic membrane normal.     Nose: Nose normal.     Mouth/Throat:     Mouth: Mucous membranes are moist.  Neck:     Musculoskeletal: Normal range of motion.  Cardiovascular:     Comments: Tachy, irregular  Pulmonary:     Effort: Pulmonary effort is normal.     Breath sounds: Normal breath sounds.  Abdominal:     General: Abdomen is flat.     Palpations: Abdomen is soft.  Musculoskeletal: Normal range of motion.  Skin:    General: Skin is warm.     Capillary Refill: Capillary refill takes less than 2 seconds.  Neurological:     General: No focal deficit present.     Mental Status: He is alert and oriented to person,  place, and time.  Psychiatric:        Mood and Affect: Mood normal.        Behavior: Behavior normal.      ED Treatments / Results  Labs (all labs ordered are listed, but only abnormal results are displayed) Labs Reviewed  CBC WITH DIFFERENTIAL/PLATELET - Abnormal; Notable for the following components:      Result Value   RBC 4.07 (*)    MCV 102.2 (*)    MCH 34.2 (*)    All other components within normal limits  COMPREHENSIVE METABOLIC PANEL - Abnormal; Notable for the following components:   Sodium 127 (*)    Chloride 95 (*)    Glucose, Bld 195 (*)    Creatinine, Ser 1.33 (*)    AST 127 (*)    ALT 66 (*)    Alkaline Phosphatase 143 (*)    Total Bilirubin 1.3 (*)    GFR calc non Af Amer 49 (*)    GFR calc Af Amer 56 (*)    All other components within normal limits  PROTIME-INR - Abnormal; Notable for the following components:   Prothrombin Time 25.2 (*)    INR 2.3 (*)    All other components within normal limits  POCT I-STAT EG7 - Abnormal; Notable for the following components:   pH, Ven 7.477 (*)    pCO2, Ven 35.4 (*)    Acid-Base Excess 3.0  (*)    Sodium 132 (*)    Calcium, Ion 1.08 (*)    All other components within normal limits  MAGNESIUM  I-STAT CREATININE, ED  TROPONIN I (HIGH SENSITIVITY)    EKG EKG Interpretation  Date/Time:  Friday April 10 2019 22:07:13 EDT Ventricular Rate:  77 PR Interval:    QRS Duration: 139 QT Interval:  445 QTC Calculation: 504 R Axis:   -61 Text Interpretation:  sinus rhythm, poor baseline  LVH with IVCD, LAD and secondary repol abnrm Inferior infarct, acute (RCA) Prolonged QT interval Probable RV involvement, suggest recording right precordial leads Artifact in lead(s) I II aVR rate slower than previous earlier in the day  Confirmed by Wandra Arthurs [phone]) on 04/10/2019 10:16:10 PM   Radiology No results found.  Procedures .Cardioversion  Date/Time: 04/10/2019 10:31 PM Performed by: Drenda Freeze, MD Authorized by: Drenda Freeze, MD   Consent:    Consent obtained:  Verbal   Consent given by:  Patient   Risks discussed:  Cutaneous burn, death and induced arrhythmia   Alternatives discussed:  No treatment Pre-procedure details:    Cardioversion basis:  Emergent   Rhythm:  Ventricular tachycardia Patient sedated: Yes. Refer to sedation procedure documentation for details of sedation.  Attempt one:    Cardioversion mode:  Synchronous   Waveform:  Monophasic   Shock (Joules):  200   Shock outcome:  No change in rhythm Post-procedure details:    Patient status:  Alert   Patient tolerance of procedure:  Tolerated well, no immediate complications  .Sedation  Date/Time: 04/11/2019 11:58 AM Performed by: Drenda Freeze, MD Authorized by: Drenda Freeze, MD   Consent:    Consent obtained:  Written (electronic informed consent)   Risks discussed:  Allergic reaction, dysrhythmia, inadequate sedation, nausea, vomiting, respiratory compromise necessitating ventilatory assistance and intubation, prolonged sedation necessitating reversal and prolonged hypoxia  resulting in organ damage Universal protocol:    Procedure explained and questions answered to patient or proxy's satisfaction: yes     Relevant documents present  and verified: yes     Test results available and properly labeled: yes     Imaging studies available: yes     Required blood products, implants, devices, and special equipment available: yes     Immediately prior to procedure a time out was called: yes     Patient identity confirmation method:  Arm band Pre-sedation assessment:    Time since last food or drink:  4   ASA classification: class 1 - normal, healthy patient     Mallampati score:  I - soft palate, uvula, fauces, pillars visible   Pre-sedation assessments completed and reviewed: airway patency   Immediate pre-procedure details:    Reassessment: Patient reassessed immediately prior to procedure     Reviewed: vital signs, relevant labs/tests and NPO status     Verified: bag valve mask available, emergency equipment available, intubation equipment available, IV patency confirmed, oxygen available, reversal medications available and suction available   Procedure details (see MAR for exact dosages):    Intra-procedure monitoring:  Blood pressure monitoring, continuous pulse oximetry, cardiac monitor, frequent vital sign checks and frequent LOC assessments   Total Provider sedation time (minutes):  20 Post-procedure details:    Attendance: Constant attendance by certified staff until patient recovered     Recovery: Patient returned to pre-procedure baseline     Post-sedation assessments completed and reviewed: airway patency, cardiovascular function, hydration status, mental status and respiratory function     Patient is stable for discharge or admission: yes     Patient tolerance:  Tolerated well, no immediate complications   (including critical care time)    Medications Ordered in ED Medications  etomidate (AMIDATE) injection 10 mg (10 mg Intravenous Not Given 04/10/19  2210)  etomidate (AMIDATE) injection (10 mg Intravenous Given 04/10/19 2205)     Initial Impression / Assessment and Plan / ED Course  I have reviewed the triage vital signs and the nursing notes.  Pertinent labs & imaging results that were available during my care of the patient were reviewed by me and considered in my medical decision making (see chart for details).       Jason Santana is a 85 y.o. male here with palpitations. Patient has recurrent Vtach vs afib with aberrancy.  Patient states that he usually needs cardioversion for this.  Patient was already given amiodarone with no relief.  Will perform cardioversion and obtain basic labs.  11:17 PM Patient awake and alert. INR 2.3. His sodium is 127 and LFTs slightly elevated.  However patient does not have any abdominal pain and states that he is eating and drinking well.  This can be secondary to his rapid heart rate.  His heart rate is down to the 70s and is back in sinus rhythm.  He has good cardiology follow-up and told him to follow-up with cardiology outpatient. He will continue amiodarone as per his cardiologist.   Final Clinical Impressions(s) / ED Diagnoses   Final diagnoses:  None    ED Discharge Orders    None       Drenda Freeze, MD 04/10/19 2319    Drenda Freeze, MD 04/11/19 1159

## 2019-04-10 NOTE — ED Notes (Signed)
Patient verbalizes understanding of discharge instructions. Opportunity for questioning and answers were provided. Armband removed by staff, pt discharged from ED in wheelchair.  

## 2019-04-10 NOTE — Discharge Instructions (Addendum)
Continue your current meds including coumadin and amiodarone.   See your cardiologist for follow up   Return to ER if you have palpitations, chest pain, shortness of breath.

## 2019-04-10 NOTE — Sedation Documentation (Signed)
Pt shocked at [facility], successful cardioversion.

## 2019-04-13 ENCOUNTER — Telehealth: Payer: Self-pay

## 2019-04-13 NOTE — Telephone Encounter (Signed)
Pt has an appt with Dr Tamala Julian on 8/12. Dr Tamala Julian will not be in the office but this pt needs to be seen in the office because he went to the ED on 8/7 and ended up having a Cardioversion. I called the pt to switch him to VB schedule on Wed but the pt did not answer.

## 2019-04-14 NOTE — Telephone Encounter (Signed)

## 2019-04-14 NOTE — Progress Notes (Signed)
CARDIOLOGY OFFICE NOTE  Date:  04/15/2019    Jason Santana Date of Birth: [birth year] Medical Record #[id number]  PCP:  [name], MD  Cardiologist:  Starlyn Skeans    Chief Complaint  Patient presents with  . Follow-up    Seen for Dr. Curt Bears & Tamala Julian    History of Present Illness: Jason Santana is a 85 y.o. male who presents today for a post ER visit. Seen for Dr. Tamala Julian & Camnitz.   He has a history of valvular heart disease with prior mechanical aortic valve replacement (2000), paroxysmal atrial fibrillation and atrial flutter, chronic anticoagulation therapy, and coronary artery disease with remote CABG.Had lower GI bleed May 2019 required pausing of anticoagulation therapy.  Electrical cardioversion performed June 2019, August 15, 2018, and October 22, 2018. He is on amiodarone (since 08/2018) and has been on increased doses due to recurrent arrhythmia. He has been referred to EP.   He was seen by Dr. Curt Bears back in June - beta blocker was stopped due to fatigue and bradycardia. To consider ablation with pacing if arrhythmia persisted.   Back in the ER last week - noted VT versus AF with aberrancy - was again cardioverted. Asked to follow up with cardiology.   The patient does not have symptoms concerning for COVID-19 infection (fever, chills, cough, or new shortness of breath).   Comes in today. Here alone. He has done ok since his cardioversion last week. He is feeling better. No more spells of "fast heart beating". He does not check his BP at home - does not "even have a thermometer" at home. BP was 157/71 while in the ER. But was fine at his last visit here in June. He has been intentionally losing weight - has stopped sweets, soda, ice cream and has been successful. Does probably get a little too much salt.   Noted increase in LFTs and last TSH was elevated - remains on 200 mg of Amiodarone.   Past Medical History:  Diagnosis Date  . Atrial fibrillation  ([place])   . CAD (coronary artery disease) of artery bypass graft 2000   2000 with multiple bypasses   . Chronic anticoagulation    Coumadin therapy for mechanical aortic valve. Postoperative atrial fibrillation.   . Essential hypertension   . H/O mechanical aortic valve replacement 2000   St Jude AVR  . Hypothyroidism   . Premature ventricular contractions     Past Surgical History:  Procedure Laterality Date  . AORTIC VALVE REPLACEMENT (AVR)/CORONARY ARTERY BYPASS GRAFTING (CABG)  2000  . CARDIOVERSION N/A 02/24/2018   Procedure: CARDIOVERSION;  Surgeon: Fay Records, MD;  Location: [facility] ENDOSCOPY;  Service: Cardiovascular;  Laterality: N/A;  . CARDIOVERSION N/A 08/15/2018   Procedure: CARDIOVERSION;  Surgeon: Thayer Headings, MD;  Location: [place];  Service: Cardiovascular;  Laterality: N/A;  . COLONOSCOPY WITH PROPOFOL N/A 01/08/2018   Procedure: COLONOSCOPY WITH PROPOFOL;  Surgeon: Arta Silence, MD;  Location: [facility] ENDOSCOPY;  Service: Gastroenterology;  Laterality: N/A;  . CORONARY/GRAFT ANGIOGRAPHY N/A 08/14/2018   Procedure: CORONARY/GRAFT ANGIOGRAPHY;  Surgeon: [name], MD;  Location: [place] CV LAB;  Service: Cardiovascular;  Laterality: N/A;  . ESOPHAGOGASTRODUODENOSCOPY (EGD) WITH PROPOFOL N/A 01/06/2018   Procedure: ESOPHAGOGASTRODUODENOSCOPY (EGD) WITH PROPOFOL;  Surgeon: Otis Brace, MD;  Location: [facility] ENDOSCOPY;  Service: Gastroenterology;  Laterality: N/A;     Medications: Current Meds  Medication Sig  . amiodarone (PACERONE) 200 MG tablet Take 1 tablet (200 mg total)  by mouth daily.  . Cholecalciferol (VITAMIN D) 2000 units tablet Take 2,000 Units by mouth daily.  [name] COUMADIN 5 MG tablet Take 5-7.5 mg by mouth daily at 6 PM.   . diphenhydramine-acetaminophen (TYLENOL PM) 25-500 MG TABS tablet Take 0.5 tablets by mouth at bedtime as needed (sleep).  . ferrous sulfate 325 (65 FE) MG tablet Take 325 mg by mouth daily with supper.  . finasteride  (PROSCAR) 5 MG tablet Take 5 mg by mouth at bedtime.   [name] levothyroxine (SYNTHROID) 125 MCG tablet Take 125 mcg by mouth daily.  . Multiple Vitamins-Minerals (PRESERVISION AREDS 2) CAPS Take 1 capsule by mouth 2 (two) times daily.  . pantoprazole (PROTONIX) 40 MG tablet Take 1 tablet (40 mg total) by mouth 2 (two) times daily.  [name] Glycol-Propyl Glycol (SYSTANE OP) Place 1 drop into both eyes daily as needed (dry eyes).  . rosuvastatin (CRESTOR) 5 MG tablet Take 5 mg by mouth daily.  . tamsulosin (FLOMAX) 0.4 MG CAPS capsule Take 0.4 mg by mouth at bedtime.   [name] tobramycin (TOBREX) 0.3 % ophthalmic solution Place 1 drop into the left eye See admin instructions. Instill 1 drop into left eye 1 day before, the day of, and the day after eye injections administered at Dr's office  . traMADol (ULTRAM) 50 MG tablet Take 1 tablet (50 mg total) by mouth every 6 (six) hours as needed for moderate pain.     Allergies: Allergies  Allergen Reactions  . Vicodin [Hydrocodone-Acetaminophen]     Severe sensitivity  . Zocor [Simvastatin]     Short memory loss.  [name] [Olmesartan]     Abdominal cramping and increased stools/ irritable bowel  . Codeine Nausea And Vomiting  . Lopid [Gemfibrozil] Other (See Comments)    transaminitis  . Monopril [Fosinopril]     transaminitis  . Phenergan [Promethazine Hcl]     Severe somnolence.  . Prilosec [Omeprazole]     dyspepsia    Social History: The patient  reports that he has quit smoking. His smoking use included cigarettes. He has never used smokeless tobacco. He reports that he does not drink alcohol or use drugs.   Family History: The patient's family history includes Heart attack in his mother; Hypertension in his father and mother.   Review of Systems: Please see the history of present illness.   All other systems are reviewed and negative.   Physical Exam: VS:  BP (!) 158/68   Pulse 63   Ht 5\' 10"  (1.778 m)   Wt 176 lb (79.8 kg)    SpO2 99%   BMI 25.25 kg/m  .  BMI Body mass index is 25.25 kg/m.  Wt Readings from Last 3 Encounters:  04/15/19 176 lb (79.8 kg)  02/17/19 184 lb (83.5 kg)  11/10/18 193 lb (87.5 kg)   Repeat BP is 160/80.   General: Pleasant. He looks younger than his stated age.  His weight is down. He is thin.  HEENT: Normal.  Neck: Supple, no JVD, carotid bruits, or masses noted.  Cardiac: Regular rate and rhythm. Aortic valve is crisp. No edema.  Respiratory:  Lungs are clear to auscultation bilaterally with normal work of breathing.  GI: Soft and nontender.  MS: No deformity or atrophy. Gait and ROM intact.  Skin: Warm and dry. Color is normal.  Neuro:  Strength and sensation are intact and no gross focal deficits noted.  Psych: Alert, appropriate and with normal affect.   LABORATORY DATA:  EKG:  EKG is ordered today. This demonstrates NSR - septal Q's and LVH.  Lab Results  Component Value Date   WBC 5.2 04/10/2019   HGB 15.0 04/10/2019   HCT 44.0 04/10/2019   PLT 227 04/10/2019   GLUCOSE 195 (H) 04/10/2019   CHOL 199 08/12/2018   TRIG 241 (H) 08/12/2018   HDL 26 (L) 08/12/2018   LDLCALC 125 (H) 08/12/2018   ALT 66 (H) 04/10/2019   AST 127 (H) 04/10/2019   NA 132 (L) 04/10/2019   K 4.0 04/10/2019   CL 95 (L) 04/10/2019   CREATININE 1.20 04/10/2019   BUN 12 04/10/2019   CO2 22 04/10/2019   TSH 7.580 (H) 01/28/2019   INR 2.3 (H) 04/10/2019   HGBA1C 5.7 (H) 08/12/2018   Lab Results  Component Value Date   INR 2.3 (H) 04/10/2019   INR 6.4 (HH) 10/28/2018   INR 3.58 10/22/2018    BNP (last 3 results) Recent Labs    10/22/18 0755  BNP 242.5*    ProBNP (last 3 results) No results for input(s): PROBNP in the last 8760 hours.   Other Studies Reviewed Today:  TTE 08/12/18   - Left ventricle: The cavity size was normal. There was mild concentric hypertrophy. Systolic function was normal. The estimated ejection fraction was in the range of 55% to 60%. Wall  motion was normal; there were no regional wall motion abnormalities. - Ventricular septum: Septal motion showed paradox. These changes are consistent with a post-thoracotomy state. - Aortic valve: A mechanical prosthesis was present and functioning normally. - Mitral valve: Calcified annulus. There was mild regurgitation. - Left atrium: The atrium was mildly dilated. - Right ventricle: Systolic function was mildly to moderately reduced. - Right atrium: The atrium was mildly dilated.  LHC 08/14/18  Mid RCA lesion is 100% stenosed.  Ost 2nd Mrg lesion is 100% stenosed.  Prox LAD to Mid LAD lesion is 50% stenosed.  SVG.  The graft exhibits mild . 1. Severe two-vessel coronary artery disease with total occlusion of the OM 2 and the mid RCA, nonobstructive LAD stenosis 2. Status post remote aortocoronary bypass surgery with continued patency of the LIMA to LAD, saphenous vein graft OM 2, and saphenous vein graft to PDA  ASSESSMENT AND PLAN:  1.  Persistent atrial fibrillation: He has had recurrent spell requiring cardioversion. He remains on low dose amiodarone - he has abnormality on his labs - rechecking this today - ?related to amiodarone - will make arrangements to see Dr. Curt Bears to discuss proceeding on with AV nodal ablation/PPM implant. He remains on Coumadin   2.  Mechanical AVR - last echo stable.   3. CAD - remote CABG - no active symptoms.   4. History of bradycardia - HR in the 60's - no longer on beta blocker.   5. Elevated BP - needs to be followed - probably needs more medicine - he has no way to monitor but wants to try using less salt - will need to follow.   6. Intentional weight loss - still little worrisome given lab abnormalities.   7. Hypothyroidism - rechecking TSH today.   8. High risk medicine - see above.   9. COVID-19 Education: The signs and symptoms of COVID-19 were discussed with the patient and how to seek care for testing  (follow up with PCP or arrange E-visit).  The importance of social distancing, staying at home, hand hygiene and wearing a mask when out in public were discussed today.  Current  medicines are reviewed with the patient today.  The patient does not have concerns regarding medicines other than what has been noted above.  The following changes have been made:  See above.  Labs/ tests ordered today include:    Orders Placed This Encounter  Procedures  . Basic metabolic panel  . Hepatic function panel  . Lipid panel  . TSH  . EKG 12-Lead     Disposition:   FU with Dr. Curt Bears to discuss ablation/PPM implant. See Dr. Tamala Julian back as planned. Recheck his labs today here.   Patient is agreeable to this plan and will call if any problems develop in the interim.   SignedTruitt Merle, NP  04/15/2019 8:57 AM  [facility] [address] [place], [place]  [address] Phone: [phone] Fax: [phone]

## 2019-04-15 ENCOUNTER — Ambulatory Visit: Payer: Medicare Other | Admitting: Interventional Cardiology

## 2019-04-15 ENCOUNTER — Encounter: Payer: Self-pay | Admitting: Nurse Practitioner

## 2019-04-15 ENCOUNTER — Ambulatory Visit (INDEPENDENT_AMBULATORY_CARE_PROVIDER_SITE_OTHER): Payer: Medicare Other | Admitting: Nurse Practitioner

## 2019-04-15 ENCOUNTER — Other Ambulatory Visit: Payer: Self-pay

## 2019-04-15 VITALS — BP 158/68 | HR 63 | Ht 70.0 in | Wt 176.0 lb

## 2019-04-15 DIAGNOSIS — R945 Abnormal results of liver function studies: Secondary | ICD-10-CM | POA: Diagnosis not present

## 2019-04-15 DIAGNOSIS — Z952 Presence of prosthetic heart valve: Secondary | ICD-10-CM | POA: Diagnosis not present

## 2019-04-15 DIAGNOSIS — Z7189 Other specified counseling: Secondary | ICD-10-CM

## 2019-04-15 DIAGNOSIS — R7989 Other specified abnormal findings of blood chemistry: Secondary | ICD-10-CM

## 2019-04-15 DIAGNOSIS — I480 Paroxysmal atrial fibrillation: Secondary | ICD-10-CM | POA: Diagnosis not present

## 2019-04-15 DIAGNOSIS — I25708 Atherosclerosis of coronary artery bypass graft(s), unspecified, with other forms of angina pectoris: Secondary | ICD-10-CM | POA: Diagnosis not present

## 2019-04-15 DIAGNOSIS — Z79899 Other long term (current) drug therapy: Secondary | ICD-10-CM | POA: Diagnosis not present

## 2019-04-15 DIAGNOSIS — Z7901 Long term (current) use of anticoagulants: Secondary | ICD-10-CM

## 2019-04-15 LAB — BASIC METABOLIC PANEL
BUN/Creatinine Ratio: 10 (ref 10–24)
BUN: 11 mg/dL (ref 8–27)
CO2: 23 mmol/L (ref 20–29)
Calcium: 9.3 mg/dL (ref 8.6–10.2)
Chloride: 95 mmol/L — ABNORMAL LOW (ref 96–106)
Creatinine, Ser: 1.08 mg/dL (ref 0.76–1.27)
GFR calc Af Amer: 72 mL/min/{1.73_m2} (ref >59)
GFR calc non Af Amer: 63 mL/min/{1.73_m2} (ref >59)
Glucose: 104 mg/dL — ABNORMAL HIGH (ref 65–99)
Potassium: 4.3 mmol/L (ref 3.5–5.2)
Sodium: 132 mmol/L — ABNORMAL LOW (ref 134–144)

## 2019-04-15 LAB — HEPATIC FUNCTION PANEL
ALT: 67 IU/L — ABNORMAL HIGH (ref 0–44)
AST: 129 IU/L — ABNORMAL HIGH (ref 0–40)
Albumin: 3.7 g/dL (ref 3.6–4.6)
Alkaline Phosphatase: 135 IU/L — ABNORMAL HIGH (ref 39–117)
Bilirubin Total: 1.2 mg/dL (ref 0.0–1.2)
Bilirubin, Direct: 0.43 mg/dL — ABNORMAL HIGH (ref 0.00–0.40)
Total Protein: 7.2 g/dL (ref 6.0–8.5)

## 2019-04-15 LAB — LIPID PANEL
Chol/HDL Ratio: 4.3 ratio (ref 0.0–5.0)
Cholesterol, Total: 130 mg/dL (ref 100–199)
HDL: 30 mg/dL — ABNORMAL LOW (ref >39)
LDL Calculated: 69 mg/dL (ref 0–99)
Triglycerides: 157 mg/dL — ABNORMAL HIGH (ref 0–149)
VLDL Cholesterol Cal: 31 mg/dL (ref 5–40)

## 2019-04-15 LAB — TSH: TSH: 4.36 u[IU]/mL (ref 0.450–4.500)

## 2019-04-15 NOTE — Patient Instructions (Addendum)
After Visit Summary:  We will be checking the following labs today - BMET, HPF and TSH   Medication Instructions:    Continue with your current medicines.    If you need a refill on your cardiac medications before your next appointment, please call your pharmacy.     Testing/Procedures To Be Arranged:  N/A  Follow-Up:   See Dr. Curt Bears to discuss ablation/possible pacemaker   See Dr. Tamala Julian as planned    At [facility], you and your health needs are our priority.  As part of our continuing mission to provide you with exceptional heart care, we have created designated Provider Care Teams.  These Care Teams include your primary Cardiologist (physician) and Advanced Practice Providers (APPs -  Physician Assistants and Nurse Practitioners) who all work together to provide you with the care you need, when you need it.  Special Instructions:  . Stay safe, stay home, wash your hands for at least 20 seconds and wear a mask when out in public.  . It was good to talk with you today.  . Try to use less salt.    Call the [place] office at [phone] if you have any questions, problems or concerns.

## 2019-04-16 ENCOUNTER — Other Ambulatory Visit: Payer: Self-pay | Admitting: *Deleted

## 2019-04-16 ENCOUNTER — Telehealth: Payer: Self-pay | Admitting: *Deleted

## 2019-04-16 DIAGNOSIS — R7989 Other specified abnormal findings of blood chemistry: Secondary | ICD-10-CM

## 2019-04-16 NOTE — Telephone Encounter (Signed)
Call placed to pt re: lab results. He has been made aware to d/c Crestor, have a US Abdomen 04/24/2019 and to see Dr. Curt Bears 05/05/2019.

## 2019-04-24 ENCOUNTER — Telehealth: Payer: Self-pay

## 2019-04-24 ENCOUNTER — Ambulatory Visit
Admission: RE | Admit: 2019-04-24 | Discharge: 2019-04-24 | Disposition: A | Discharge status: Home / Self Care | Payer: Medicare Other | Source: Ambulatory Visit | Attending: Nurse Practitioner | Admitting: Nurse Practitioner

## 2019-04-24 DIAGNOSIS — R7989 Other specified abnormal findings of blood chemistry: Secondary | ICD-10-CM

## 2019-04-24 DIAGNOSIS — N281 Cyst of kidney, acquired: Secondary | ICD-10-CM | POA: Diagnosis not present

## 2019-04-24 NOTE — Telephone Encounter (Signed)
Attempted to call pt regarding results, no answer and unable to leave a message at this time. Will try back later.

## 2019-04-25 ENCOUNTER — Emergency Department (HOSPITAL_COMMUNITY)
Admission: EM | Admit: 2019-04-25 | Discharge: 2019-04-25 | Disposition: A | Discharge status: Home / Self Care | Payer: Medicare Other | Attending: Emergency Medicine | Admitting: Emergency Medicine

## 2019-04-25 ENCOUNTER — Other Ambulatory Visit: Payer: Self-pay

## 2019-04-25 ENCOUNTER — Encounter (HOSPITAL_COMMUNITY): Payer: Self-pay | Admitting: *Deleted

## 2019-04-25 DIAGNOSIS — Z952 Presence of prosthetic heart valve: Secondary | ICD-10-CM | POA: Insufficient documentation

## 2019-04-25 DIAGNOSIS — Z87891 Personal history of nicotine dependence: Secondary | ICD-10-CM | POA: Diagnosis not present

## 2019-04-25 DIAGNOSIS — E039 Hypothyroidism, unspecified: Secondary | ICD-10-CM | POA: Diagnosis not present

## 2019-04-25 DIAGNOSIS — I2510 Atherosclerotic heart disease of native coronary artery without angina pectoris: Secondary | ICD-10-CM | POA: Insufficient documentation

## 2019-04-25 DIAGNOSIS — I10 Essential (primary) hypertension: Secondary | ICD-10-CM | POA: Insufficient documentation

## 2019-04-25 DIAGNOSIS — Z79899 Other long term (current) drug therapy: Secondary | ICD-10-CM | POA: Insufficient documentation

## 2019-04-25 DIAGNOSIS — I480 Paroxysmal atrial fibrillation: Secondary | ICD-10-CM | POA: Diagnosis not present

## 2019-04-25 DIAGNOSIS — R002 Palpitations: Secondary | ICD-10-CM | POA: Diagnosis not present

## 2019-04-25 DIAGNOSIS — R531 Weakness: Secondary | ICD-10-CM | POA: Diagnosis not present

## 2019-04-25 DIAGNOSIS — I213 ST elevation (STEMI) myocardial infarction of unspecified site: Secondary | ICD-10-CM | POA: Diagnosis not present

## 2019-04-25 DIAGNOSIS — I447 Left bundle-branch block, unspecified: Secondary | ICD-10-CM | POA: Diagnosis not present

## 2019-04-25 DIAGNOSIS — I499 Cardiac arrhythmia, unspecified: Secondary | ICD-10-CM | POA: Diagnosis not present

## 2019-04-25 LAB — CBC
HCT: 40.5 % (ref 39.0–52.0)
Hemoglobin: 13.5 g/dL (ref 13.0–17.0)
MCH: 34 pg (ref 26.0–34.0)
MCHC: 33.3 g/dL (ref 30.0–36.0)
MCV: 102 fL — ABNORMAL HIGH (ref 80.0–100.0)
Platelets: 207 10*3/uL (ref 150–400)
RBC: 3.97 MIL/uL — ABNORMAL LOW (ref 4.22–5.81)
RDW: 15 % (ref 11.5–15.5)
WBC: 4.4 10*3/uL (ref 4.0–10.5)
nRBC: 0 % (ref 0.0–0.2)

## 2019-04-25 LAB — BASIC METABOLIC PANEL
Anion gap: 8 (ref 5–15)
BUN: 13 mg/dL (ref 8–23)
CO2: 25 mmol/L (ref 22–32)
Calcium: 8.9 mg/dL (ref 8.9–10.3)
Chloride: 101 mmol/L (ref 98–111)
Creatinine, Ser: 1.25 mg/dL — ABNORMAL HIGH (ref 0.61–1.24)
GFR calc Af Amer: >60 mL/min (ref >60)
GFR calc non Af Amer: 53 mL/min — ABNORMAL LOW (ref >60)
Glucose, Bld: 125 mg/dL — ABNORMAL HIGH (ref 70–99)
Potassium: 4.1 mmol/L (ref 3.5–5.1)
Sodium: 134 mmol/L — ABNORMAL LOW (ref 135–145)

## 2019-04-25 MED ORDER — SODIUM CHLORIDE 0.9% FLUSH: 3.0000 mL | Freq: Once | INTRAVENOUS | Status: DC

## 2019-04-25 NOTE — ED Notes (Signed)
Patient verbalizes understanding of discharge instructions. Opportunity for questioning and answers were provided.  pt discharged from ED. Ambulating by self

## 2019-04-25 NOTE — Discharge Instructions (Addendum)
Please take your medications as soon as you get home Follow up with Dr. Inda Merlin as scheduled Monday Follow up with cardiology as scheduled

## 2019-04-25 NOTE — ED Provider Notes (Addendum)
[facility] EMERGENCY DEPARTMENT Provider Note   CSN: [id number] Arrival date & time: 04/25/19  [id number]     History   Chief Complaint Chief Complaint  Patient presents with   Palpitations    HPI Jason Santana is a 85 y.o. male.     HPI  85 yo male ho afib with rvr presents today with palpiatations distant with his A. fib which awoke him from sleep around 4 this morning.  He normally takes his amiodarone at 4:30 in the morning but did not take it today.  Symptoms have since resolved.  He denies any pain, dyspnea, or lightheadedness.  He reports taking his medications as prescribed.  He has been on Coumadin for 20 years and this is followed in the outpatient clinic and has not had any recent adjustments.  He denies any pain, fever, sore throat, cough, or COVID symptoms.  He has been stable here in the ED on a monitor with normal sinus rhythm.  His initial EKG obtained on arrival and he still felt like he was having some palpitations was in sinus rhythm.  He is scheduled to follow-up with electrophysiology.  He has been previously cardioverted here in the ED.  Past Medical History:  Diagnosis Date   Atrial fibrillation ([place])    CAD (coronary artery disease) of artery bypass graft 2000   2000 with multiple bypasses    Chronic anticoagulation    Coumadin therapy for mechanical aortic valve. Postoperative atrial fibrillation.    Essential hypertension    H/O mechanical aortic valve replacement 2000   St Jude AVR   Hypothyroidism    Premature ventricular contractions     Patient Active Problem List   Diagnosis Date Noted   Chest pain 08/11/2018   Atypical atrial flutter ([place]) 02/07/2018   Syncope 01/04/2018   Coronary artery disease involving native coronary artery of native heart with unstable angina pectoris ([place]) 01/04/2018   GI bleed 01/03/2018   Macular degeneration of left eye 05/26/2017   Hypothyroidism 05/26/2017   Hyponatremia 05/25/2017     Sepsis ([place]) 05/25/2017   Muscle weakness 12/13/2014   Paroxysmal atrial flutter ([place]) 11/25/2013   Essential hypertension 11/25/2013   H/O mechanical aortic valve replacement 11/25/2013   CAD (coronary artery disease) of artery bypass graft 11/25/2013   Chronic anticoagulation 11/25/2013   Premature ventricular contractions 11/25/2013    Past Surgical History:  Procedure Laterality Date   AORTIC VALVE REPLACEMENT (AVR)/CORONARY ARTERY BYPASS GRAFTING (CABG)  2000   CARDIOVERSION N/A 02/24/2018   Procedure: CARDIOVERSION;  Surgeon: Fay Records, MD;  Location: [facility] ENDOSCOPY;  Service: Cardiovascular;  Laterality: N/A;   CARDIOVERSION N/A 08/15/2018   Procedure: CARDIOVERSION;  Surgeon: Thayer Headings, MD;  Location: [place];  Service: Cardiovascular;  Laterality: N/A;   COLONOSCOPY WITH PROPOFOL N/A 01/08/2018   Procedure: COLONOSCOPY WITH PROPOFOL;  Surgeon: Arta Silence, MD;  Location: [facility] ENDOSCOPY;  Service: Gastroenterology;  Laterality: N/A;   CORONARY/GRAFT ANGIOGRAPHY N/A 08/14/2018   Procedure: CORONARY/GRAFT ANGIOGRAPHY;  Surgeon: [name], MD;  Location: [place] CV LAB;  Service: Cardiovascular;  Laterality: N/A;   ESOPHAGOGASTRODUODENOSCOPY (EGD) WITH PROPOFOL N/A 01/06/2018   Procedure: ESOPHAGOGASTRODUODENOSCOPY (EGD) WITH PROPOFOL;  Surgeon: Otis Brace, MD;  Location: [facility] ENDOSCOPY;  Service: Gastroenterology;  Laterality: N/A;        Home Medications    Prior to Admission medications   Medication Sig Start Date End Date Taking? Authorizing Provider  amiodarone (PACERONE) 200 MG tablet Take 1 tablet (  200 mg total) by mouth daily. 11/20/18   Camnitz, Ocie Doyne, MD  Cholecalciferol (VITAMIN D) 2000 units tablet Take 2,000 Units by mouth daily.    [provider]  COUMADIN 5 MG tablet Take 5-7.5 mg by mouth daily at 6 PM.  09/18/13   [provider]  diphenhydramine-acetaminophen (TYLENOL PM) 25-500 MG TABS tablet  Take 0.5 tablets by mouth at bedtime as needed (sleep).    [provider]  ferrous sulfate 325 (65 FE) MG tablet Take 325 mg by mouth daily with supper.    [provider]  finasteride (PROSCAR) 5 MG tablet Take 5 mg by mouth at bedtime.  10/07/17   [provider]  levothyroxine (SYNTHROID) 125 MCG tablet Take 125 mcg by mouth daily. 11/21/18   [provider]  Multiple Vitamins-Minerals (PRESERVISION AREDS 2) CAPS Take 1 capsule by mouth 2 (two) times daily.    [provider]  pantoprazole (PROTONIX) 40 MG tablet Take 1 tablet (40 mg total) by mouth 2 (two) times daily. 01/08/18   Raiford Noble Latif, DO  Polyethyl Glycol-Propyl Glycol (SYSTANE OP) Place 1 drop into both eyes daily as needed (dry eyes).    [provider]  tamsulosin (FLOMAX) 0.4 MG CAPS capsule Take 0.4 mg by mouth at bedtime.     [provider]  tobramycin (TOBREX) 0.3 % ophthalmic solution Place 1 drop into the left eye See admin instructions. Instill 1 drop into left eye 1 day before, the day of, and the day after eye injections administered at Dr's office    [provider]  traMADol (ULTRAM) 50 MG tablet Take 1 tablet (50 mg total) by mouth every 6 (six) hours as needed for moderate pain. 08/29/18   Isaiah Serge, NP    Family History Family History  Problem Relation Age of Onset   Hypertension Mother    Heart attack Mother    Hypertension Father     Social History Social History   Tobacco Use   Smoking status: Former Smoker    Types: Cigarettes   Smokeless tobacco: Never Used   Tobacco comment: quit 57 years ago  Substance Use Topics   Alcohol use: No   Drug use: No     Allergies   Vicodin [hydrocodone-acetaminophen], Zocor [simvastatin], Benicar [olmesartan], Codeine, Lopid [gemfibrozil], Monopril [fosinopril], Phenergan [promethazine hcl], and Prilosec [omeprazole]   Review of Systems Review of Systems  Constitutional:  Positive for fatigue.  All other systems reviewed and are negative.    Physical Exam Updated Vital Signs BP (!) 151/64    Pulse 61    Temp 98.3 F (36.8 C) (Oral)    Resp 15    SpO2 96%   Physical Exam Vitals signs and nursing note reviewed.  Constitutional:      Appearance: Normal appearance.  HENT:     Head: Normocephalic and atraumatic.     Right Ear: External ear normal.     Left Ear: External ear normal.     Nose: Nose normal.     Mouth/Throat:     Mouth: Mucous membranes are moist.  Eyes:     Extraocular Movements: Extraocular movements intact.     Pupils: Pupils are equal, round, and reactive to light.  Neck:     Musculoskeletal: Normal range of motion.  Cardiovascular:     Rate and Rhythm: Normal rate and regular rhythm.     Pulses: Normal pulses.  Pulmonary:     Effort: Pulmonary effort is normal.  Breath sounds: Normal breath sounds.  Abdominal:     General: Abdomen is flat.     Palpations: Abdomen is soft.  Musculoskeletal: Normal range of motion.  Skin:    General: Skin is warm and dry.     Capillary Refill: Capillary refill takes less than 2 seconds.  Neurological:     General: No focal deficit present.     Mental Status: He is alert and oriented to person, place, and time.  Psychiatric:        Mood and Affect: Mood normal.        Behavior: Behavior normal.      ED Treatments / Results  Labs (all labs ordered are listed, but only abnormal results are displayed) Labs Reviewed  BASIC METABOLIC PANEL - Abnormal; Notable for the following components:      Result Value   Sodium 134 (*)    Glucose, Bld 125 (*)    Creatinine, Ser 1.25 (*)    GFR calc non Af Amer 53 (*)    All other components within normal limits  CBC - Abnormal; Notable for the following components:   RBC 3.97 (*)    MCV 102.0 (*)    All other components within normal limits    EKG EKG Interpretation  Date/Time:  Saturday April 25 2019 04:05:23 EDT Ventricular Rate:   74 PR Interval:  184 QRS Duration: 128 QT Interval:  466 QTC Calculation: 517 R Axis:   -64 Text Interpretation:  Normal sinus rhythm Left axis deviation Left ventricular hypertrophy with QRS widening and repolarization abnormality Abnormal ECG No significant change since last tracing august 7 Confirmed by Merrily Pew [phone]) on 04/25/2019 5:30:08 AM   Radiology US Abdomen Complete  Result Date: 04/24/2019 CLINICAL DATA:  Elevated liver enzymes EXAM: ABDOMEN ULTRASOUND COMPLETE COMPARISON:  CT abdomen and pelvis February 26, 2018 FINDINGS: Gallbladder: Surgically absent. Common bile duct: Diameter: 2 mm. No intrahepatic, common hepatic, or common bile dilatation. Liver: No focal lesion identified. Within normal limits in parenchymal echogenicity. Portal vein is patent on color Doppler imaging with normal direction of blood flow towards the liver. IVC: No abnormality visualized. Pancreas: Visualized portion unremarkable. Portions of pancreas obscured by gas. Spleen: Size and appearance within normal limits. Right Kidney: Length: 10.6 cm. Echogenicity within normal limits. No hydronephrosis visualized. There is a cyst in the right kidney measuring 1.3 x 0.7 x 0.7 cm. Left Kidney: Length: 11.2 cm. Echogenicity within normal limits. No hydronephrosis visualized. There is a cyst in the mid kidney measuring 1.9 x 1.5 x 1.3 cm. There are scattered subcentimeter cysts in the left kidney is well. Abdominal aorta: No aneurysm visualized. Other findings: No demonstrable ascites. IMPRESSION: 1.  Gallbladder absent. 2. Portions of pancreas obscured by gas. Visualized portions of pancreas appear normal. 3.  Small renal cysts bilaterally. 4.  Study otherwise unremarkable. Electronically Signed   By: Lowella Grip III M.D.   On: 04/24/2019 09:44    Procedures Procedures (including critical care time)  Medications Ordered in ED Medications  sodium chloride flush (NS) 0.9 % injection 3 mL (has no administration  in time range)     Initial Impression / Assessment and Plan / ED Course  I have reviewed the triage vital signs and the nursing notes.  Pertinent labs & imaging results that were available during my care of the patient were reviewed by me and considered in my medical decision making (see chart for details).       Patient with ho  afib presents with palpitations. EKG here with nsr Symptoms have resolved We discussed checking his INR, but I feel that he is safe for discharge with follow-up as an outpatient as he has been on it for an extended period time and has regular checks scheduled Patient stable for d/c  Discussed return precautions and need for f/u and voices understanding Final Clinical Impressions(s) / ED Diagnoses   Final diagnoses:  Palpitations  Paroxysmal atrial fibrillation [facility])    ED Discharge Orders    None       Pattricia Boss, MD 04/25/19 [id number]    Pattricia Boss, MD 04/25/19 [phone]

## 2019-04-25 NOTE — ED Notes (Signed)
Patient woke up this morning around 3am with his heart racing. Wife called ambulance for patient. Pt states that this is has happened a few times before in the past.

## 2019-04-27 ENCOUNTER — Encounter: Payer: Self-pay | Admitting: Cardiology

## 2019-04-27 ENCOUNTER — Telehealth: Payer: Self-pay

## 2019-04-27 DIAGNOSIS — R5383 Other fatigue: Secondary | ICD-10-CM | POA: Diagnosis not present

## 2019-04-27 DIAGNOSIS — Z79899 Other long term (current) drug therapy: Secondary | ICD-10-CM

## 2019-04-27 DIAGNOSIS — I4891 Unspecified atrial fibrillation: Secondary | ICD-10-CM | POA: Diagnosis not present

## 2019-04-27 DIAGNOSIS — R740 Nonspecific elevation of levels of transaminase and lactic acid dehydrogenase [LDH]: Secondary | ICD-10-CM | POA: Diagnosis not present

## 2019-04-27 DIAGNOSIS — I10 Essential (primary) hypertension: Secondary | ICD-10-CM | POA: Diagnosis not present

## 2019-04-27 DIAGNOSIS — Z7901 Long term (current) use of anticoagulants: Secondary | ICD-10-CM | POA: Diagnosis not present

## 2019-04-27 NOTE — Telephone Encounter (Signed)
Notes recorded by Frederik Schmidt, RN on 04/27/2019 at 2:48 PM EDT  The patient has been notified of the result and verbalized understanding. All questions (if any) were answered.  Frederik Schmidt, RN 04/27/2019 2:48 PM

## 2019-04-27 NOTE — Telephone Encounter (Signed)
-----   Message from Burtis Junes, NP sent at 04/24/2019 10:22 AM EDT ----- Please report. Overall stable ultrasound of the abdomen.  He needs to see Dr. Curt Bears as planned  Would like for him to have repeat LFTs a day or so prior to that visit so we can see what his liver tests are doing and to aid Dr. Curt Bears in upcoming decision making regarding amiodarone therapy.

## 2019-04-27 NOTE — Telephone Encounter (Signed)
Notes recorded by Frederik Schmidt, RN on 04/27/2019 at 1:46 PM EDT  I tried to reach patient, our # is blocked 8/24  ------

## 2019-04-28 ENCOUNTER — Other Ambulatory Visit: Payer: Self-pay

## 2019-04-28 ENCOUNTER — Other Ambulatory Visit: Payer: Medicare Other

## 2019-04-28 DIAGNOSIS — Z79899 Other long term (current) drug therapy: Secondary | ICD-10-CM

## 2019-04-28 LAB — HEPATIC FUNCTION PANEL
ALT: 62 IU/L — ABNORMAL HIGH (ref 0–44)
AST: 115 IU/L — ABNORMAL HIGH (ref 0–40)
Albumin: 3.9 g/dL (ref 3.6–4.6)
Alkaline Phosphatase: 138 IU/L — ABNORMAL HIGH (ref 39–117)
Bilirubin Total: 1.6 mg/dL — ABNORMAL HIGH (ref 0.0–1.2)
Bilirubin, Direct: 0.51 mg/dL — ABNORMAL HIGH (ref 0.00–0.40)
Total Protein: 7.5 g/dL (ref 6.0–8.5)

## 2019-05-05 ENCOUNTER — Other Ambulatory Visit: Payer: Self-pay

## 2019-05-05 ENCOUNTER — Ambulatory Visit (INDEPENDENT_AMBULATORY_CARE_PROVIDER_SITE_OTHER): Payer: Medicare Other | Admitting: Cardiology

## 2019-05-05 ENCOUNTER — Encounter: Payer: Self-pay | Admitting: Cardiology

## 2019-05-05 VITALS — BP 140/74 | HR 67 | Ht 70.0 in | Wt 172.8 lb

## 2019-05-05 DIAGNOSIS — I4819 Other persistent atrial fibrillation: Secondary | ICD-10-CM | POA: Diagnosis not present

## 2019-05-05 DIAGNOSIS — I25708 Atherosclerosis of coronary artery bypass graft(s), unspecified, with other forms of angina pectoris: Secondary | ICD-10-CM

## 2019-05-05 DIAGNOSIS — Z79899 Other long term (current) drug therapy: Secondary | ICD-10-CM

## 2019-05-05 LAB — PROTIME-INR
INR: 3.4 — ABNORMAL HIGH (ref 0.8–1.2)
Prothrombin Time: 35.9 s — ABNORMAL HIGH (ref 9.1–12.0)

## 2019-05-05 NOTE — Progress Notes (Signed)
Electrophysiology Office Note   Date:  05/05/2019   ID:  Jason Santana, DOB 1932-02-01, MRN [id number]  PCP:  [name], MD  Cardiologist:  Tamala Julian Primary Electrophysiologist:  Miaya Lafontant Meredith Leeds, MD    No chief complaint on file.    History of Present Illness: Jason Santana is a 85 y.o. male who is being seen today for the evaluation of atrial fibrillation at the request of [name], MD. Presenting today for electrophysiology evaluation.  He has a history of mechanical aortic valve replacement in 2000, atrial fibrillation and atrial flutter, coronary artery disease.  He had a GI bleed May 2019 requiring a pause and anticoagulation.  He has had 3 cardioversions over the past 6 months, most recently October 22, 2018.  He is currently on amiodarone which has been increased to 400 mg daily.  Symptoms with atrial fibrillation are lightheadedness and fatigue.  Amiodarone was started December 2019.  He was in the emergency room 04/10/2019 with palpitations.  He was found to be in atrial fibrillation at the time.  He had a cardioversion into sinus rhythm.  Subsequent labs have shown elevated LFTs and TSH.  Today, denies symptoms of palpitations, chest pain, shortness of breath, orthopnea, PND, lower extremity edema, claudication, dizziness, presyncope, syncope, bleeding, or neurologic sequela. The patient is tolerating medications without difficulties.     Past Medical History:  Diagnosis Date  . Atrial fibrillation ([place])   . CAD (coronary artery disease) of artery bypass graft 2000   2000 with multiple bypasses   . Chronic anticoagulation    Coumadin therapy for mechanical aortic valve. Postoperative atrial fibrillation.   . Essential hypertension   . H/O mechanical aortic valve replacement 2000   [employer] AVR  . Hypothyroidism   . Premature ventricular contractions    Past Surgical History:  Procedure Laterality Date  . AORTIC VALVE REPLACEMENT (AVR)/CORONARY ARTERY BYPASS  GRAFTING (CABG)  2000  . CARDIOVERSION N/A 02/24/2018   Procedure: CARDIOVERSION;  Surgeon: Fay Records, MD;  Location: [facility] ENDOSCOPY;  Service: Cardiovascular;  Laterality: N/A;  . CARDIOVERSION N/A 08/15/2018   Procedure: CARDIOVERSION;  Surgeon: Thayer Headings, MD;  Location: [place];  Service: Cardiovascular;  Laterality: N/A;  . COLONOSCOPY WITH PROPOFOL N/A 01/08/2018   Procedure: COLONOSCOPY WITH PROPOFOL;  Surgeon: Arta Silence, MD;  Location: [facility] ENDOSCOPY;  Service: Gastroenterology;  Laterality: N/A;  . CORONARY/GRAFT ANGIOGRAPHY N/A 08/14/2018   Procedure: CORONARY/GRAFT ANGIOGRAPHY;  Surgeon: [name], MD;  Location: [place] CV LAB;  Service: Cardiovascular;  Laterality: N/A;  . ESOPHAGOGASTRODUODENOSCOPY (EGD) WITH PROPOFOL N/A 01/06/2018   Procedure: ESOPHAGOGASTRODUODENOSCOPY (EGD) WITH PROPOFOL;  Surgeon: Otis Brace, MD;  Location: [facility] ENDOSCOPY;  Service: Gastroenterology;  Laterality: N/A;     Current Outpatient Medications  Medication Sig Dispense Refill  . amLODipine (NORVASC) 2.5 MG tablet Take 1 tablet by mouth daily.    . Cholecalciferol (VITAMIN D) 2000 units tablet Take 2,000 Units by mouth daily.    [name] COUMADIN 5 MG tablet Take 5-7.5 mg by mouth daily at 6 PM.     . diphenhydramine-acetaminophen (TYLENOL PM) 25-500 MG TABS tablet Take 0.5 tablets by mouth at bedtime as needed (sleep).    . ferrous sulfate 325 (65 FE) MG tablet Take 325 mg by mouth daily with supper.    . finasteride (PROSCAR) 5 MG tablet Take 5 mg by mouth at bedtime.     [name] levothyroxine (SYNTHROID) 125 MCG tablet Take 125 mcg by mouth daily.    [name]  Multiple Vitamins-Minerals (PRESERVISION AREDS 2) CAPS Take 1 capsule by mouth 2 (two) times daily.    . pantoprazole (PROTONIX) 40 MG tablet Take 40 mg by mouth 4 (four) times a week.    [name] Glycol-Propyl Glycol (SYSTANE OP) Place 1 drop into both eyes daily as needed (dry eyes).    . tamsulosin (FLOMAX) 0.4 MG CAPS capsule  Take 0.4 mg by mouth at bedtime.     [name] tobramycin (TOBREX) 0.3 % ophthalmic solution Place 1 drop into the left eye See admin instructions. Instill 1 drop into left eye 1 day before, the day of, and the day after eye injections administered at Dr's office    . traMADol (ULTRAM) 50 MG tablet Take 1 tablet (50 mg total) by mouth every 6 (six) hours as needed for moderate pain. 30 tablet 0   No current facility-administered medications for this visit.     Allergies:   Vicodin [hydrocodone-acetaminophen], Zocor [simvastatin], Benicar [olmesartan], Codeine, Lopid [gemfibrozil], Monopril [fosinopril], Phenergan [promethazine hcl], and Prilosec [omeprazole]   Social History:  The patient  reports that he has quit smoking. His smoking use included cigarettes. He has never used smokeless tobacco. He reports that he does not drink alcohol or use drugs.   Family History:  The patient's family history includes Heart attack in his mother; Hypertension in his father and mother.   ROS:  Please see the history of present illness.   Otherwise, review of systems is positive for none.   All other systems are reviewed and negative.   PHYSICAL EXAM: VS:  BP 140/74   Pulse 67   Ht 5\' 10"  (1.778 m)   Wt 172 lb 12.8 oz (78.4 kg)   SpO2 97%   BMI 24.79 kg/m  , BMI Body mass index is 24.79 kg/m. GEN: Well nourished, well developed, in no acute distress  HEENT: normal  Neck: no JVD, carotid bruits, or masses Cardiac: RRR; no murmurs, rubs, or gallops,no edema  Respiratory:  clear to auscultation bilaterally, normal work of breathing GI: soft, nontender, nondistended, + BS MS: no deformity or atrophy  Skin: warm and dry Neuro:  Strength and sensation are intact Psych: euthymic mood, full affect  EKG:  EKG is not ordered today. Personal review of the ekg ordered 04/25/19 shows SR, LBBB   Recent Labs: 10/22/2018: B Natriuretic Peptide 242.5 04/10/2019: Magnesium 1.9 04/15/2019: TSH 4.360 04/25/2019: BUN 13;  Creatinine, Ser 1.25; Hemoglobin 13.5; Platelets 207; Potassium 4.1; Sodium 134 04/28/2019: ALT 62    Lipid Panel     Component Value Date/Time   CHOL 130 04/15/2019 0905   TRIG 157 (H) 04/15/2019 0905   HDL 30 (L) 04/15/2019 0905   CHOLHDL 4.3 04/15/2019 0905   CHOLHDL 7.7 08/12/2018 0225   VLDL 48 (H) 08/12/2018 0225   LDLCALC 69 04/15/2019 0905     Wt Readings from Last 3 Encounters:  05/05/19 172 lb 12.8 oz (78.4 kg)  04/15/19 176 lb (79.8 kg)  02/17/19 184 lb (83.5 kg)      Other studies Reviewed: Additional studies/ records that were reviewed today include: TTE 08/12/18  Review of the above records today demonstrates:  - Left ventricle: The cavity size was normal. There was mild   concentric hypertrophy. Systolic function was normal. The   estimated ejection fraction was in the range of 55% to 60%. Wall   motion was normal; there were no regional wall motion   abnormalities. - Ventricular septum: Septal motion showed paradox. These changes  are consistent with a post-thoracotomy state. - Aortic valve: A mechanical prosthesis was present and functioning   normally. - Mitral valve: Calcified annulus. There was mild regurgitation. - Left atrium: The atrium was mildly dilated. - Right ventricle: Systolic function was mildly to moderately   reduced. - Right atrium: The atrium was mildly dilated.  LHC 08/14/18  Mid RCA lesion is 100% stenosed.  Ost 2nd Mrg lesion is 100% stenosed.  Prox LAD to Mid LAD lesion is 50% stenosed.  SVG.  The graft exhibits mild .  1.  Severe two-vessel coronary artery disease with total occlusion of the OM 2 and the mid RCA, nonobstructive LAD stenosis 2.  Status post remote aortocoronary bypass surgery with continued patency of the LIMA to LAD, saphenous vein graft OM 2, and saphenous vein graft to PDA  ASSESSMENT AND PLAN:  1.  Persistent atrial fibrillation: Only on amiodarone and warfarin.  Has been on higher dose of amiodarone  which has since been decreased back to 200 mg.  This was increased after his cardioversion.  We Kelley Knoth plan to stop his amiodarone today.  We Lonn Im also plan for ablation.  Risks and benefits were discussed and include bleeding, tamponade, heart block, stroke, damage to surrounding organs.  He understands these risks and is agreed to the procedure.  I have discussed with him that due to his age, risk of complications are higher.   This patients CHA2DS2-VASc Score and unadjusted Ischemic Stroke Rate (% per year) is equal to 4.8 % stroke rate/year from a score of 4  Above score calculated as 1 point each if present [CHF, HTN, DM, Vascular=MI/PAD/Aortic Plaque, Age if 65-74, or Male] Above score calculated as 2 points each if present [Age > 75, or Stroke/TIA/TE]   2.  Mechanical aortic valve: Stable on most recent echo.  Plan per primary cardiology.  3.  Coronary artery disease: No current chest pain  Current medicines are reviewed at length with the patient today.   The patient does not have concerns regarding his medicines.  The following changes were made today:  Stop amiodarone  Labs/ tests ordered today include:  Orders Placed This Encounter  Procedures  . CT CARDIAC MORPH/PULM VEIN W/CM&W/O CA SCORE  . CT CORONARY FRACTIONAL FLOW RESERVE DATA PREP  . CT CORONARY FRACTIONAL FLOW RESERVE FLUID ANALYSIS  . Protime-INR   Case discussed with primary cardiology  Disposition:   FU with Denaya Horn 3 months  Signed, Tyreece Gelles Meredith Leeds, MD  05/05/2019 11:24 AM     [facility] [address] [place] [address] [phone] (office) [phone] (fax)

## 2019-05-05 NOTE — Patient Instructions (Addendum)
Medication Instructions:  Your physician has recommended you make the following change in your medication:  1. STOP Amiodarone  *If you need a refill on your cardiac medications before your next appointment, please call your pharmacy.  Labwork: Today: INR *Will notify you of abnormal results, otherwise continue current treatment plan.  --- make sure to get weekly INR checks until procedure. I will inform your primary doctor of this.  Want INR closer to 2.5 for procedure  Testing/Procedures: Your physician has requested that you have cardiac CT within 7 days prior to your ablation. Cardiac computed tomography (CT) is a painless test that uses an x-ray machine to take clear, detailed pictures of your heart. For further information please visit [employer]. Please follow instruction below located under special instructions.  Your physician has recommended that you have an ablation. Catheter ablation is a medical procedure used to treat some cardiac arrhythmias (irregular heartbeats). During catheter ablation, a long, thin, flexible tube is put into a blood vessel in your groin (upper thigh), or neck. This tube is called an ablation catheter. It is then guided to your heart through the blood vessel. Radio frequency waves destroy small areas of heart tissue where abnormal heartbeats may cause an arrhythmia to start. Please see the instructions below located under special instructions  Follow-Up: Your physician recommends that you schedule a follow-up appointment in: 4 weeks, after your procedure on 05/22/19, with Roderic Palau NP in the AFib clinic.  Your physician recommends that you schedule a follow up appointment in: 3 months, after your procedure on 05/22/19, with Dr. Curt Bears.  Thank you for choosing [facility]!! Trinidad Curet, RN [phone]    Any Other Special Instructions Will Be Listed Below    CT INSTRUCTIONS Your cardiac CT will be scheduled at :  [facility] [address] [place], [place] [address] [phone]  Please arrive at the [facility] main entrance of [facility] on 05/18/19 at 11:00 am.   (30-45 minutes prior to test start time) Proceed to the [facility] Radiology Department (first floor) to check-in and test prep.  Please follow these instructions carefully (unless otherwise directed):  Hold all erectile dysfunction medications at least 48 hours prior to test.  On the Night Before the Test: . Be sure to Drink plenty of water. . Do not consume any caffeinated/decaffeinated beverages or chocolate 12 hours prior to your test. . Do not take any antihistamines 12 hours prior to your test.  On the Day of the Test: . Drink plenty of water. Do not drink any water within one hour of the test. . Do not eat any food 4 hours prior to the test. . You may take your regular medications prior to the test.  . HOLD Furosemide/Hydrochlorothiazide morning of the test.      After the Test: . Drink plenty of water. . After receiving IV contrast, you may experience a mild flushed feeling. This is normal. . On occasion, you may experience a mild rash up to 24 hours after the test. This is not dangerous. If this occurs, you can take Benadryl 25 mg and increase your fluid intake. . If you experience trouble breathing, this can be serious. If it is severe call 911 IMMEDIATELY. If it is mild, please call our office.  Please contact the cardiac imaging nurse navigator should you have any questions/concerns Marchia Bond, RN Navigator Cardiac [facility] [phone] Office  [phone] Cell  Electrophysiology/Ablation Procedure Instructions   You are scheduled for a(n) ablation on 05/22/19 with Dr. Allegra Lai.   1.   Pre procedure testing-             COVID TEST-- On 05/19/19 @ 9:00 am after you get your lab work- You will go to [facility] for  your Covid testing.   This is a drive thru test site.  There will be multiple testing areas.  Be sure to share with the first checkpoint that you are there for pre-procedure/surgery testing. This will put you into the right (yellow) lane that leads to the PAT testing team. Stay in your car and the nurse team will come to your car to test you.  After you are tested please go home and self quarantine until the day of your procedure.     2. On the day of your procedure 05/22/19 you will go to [facility] hospital ([address]) at 5:30 am.  Dennis Bast will go to the main entrance A [employer]) and enter where the [employer] are.  Your driver will drop you off and you will head down the hallway to ADMITTING.  You may have one support person come in to the hospital with you.  They will be asked to wait in the waiting room.   3.   Do not eat or drink after midnight prior to your procedure.   4.   Do NOT take any medications the morning of your procedure.   5.  Plan for an overnight stay.  If you use your phone frequently bring your phone charger.   6. You will follow up with the AFIB clinic 4 weeks after your procedure.  You will follow up with Dr. Curt Bears  3 months after your procedure.  These appointments will be made for you.   * If you have ANY questions please call the office [phone] and ask for Courteney Alderete RN or send me a MyChart message   * Occasionally, EP Studies and ablations can become lengthy.  Please make your family aware of this before your procedure starts.  Average time ranges from 2-8 hours for EP studies/ablations.  Your physician will call your family after the procedure with the results.

## 2019-05-07 ENCOUNTER — Telehealth: Payer: Self-pay | Admitting: Cardiology

## 2019-05-07 NOTE — Telephone Encounter (Signed)
New Message    Jenn from [place] @ [facility] called in stating she needed to speak with you about scheduling patient for PT INR.  Please call her back when you return.

## 2019-05-08 DIAGNOSIS — I4891 Unspecified atrial fibrillation: Secondary | ICD-10-CM | POA: Diagnosis not present

## 2019-05-08 DIAGNOSIS — Z7901 Long term (current) use of anticoagulants: Secondary | ICD-10-CM | POA: Diagnosis not present

## 2019-05-08 LAB — PROTIME-INR: INR: 3.2 — AB (ref 0.9–1.1)

## 2019-05-13 DIAGNOSIS — I4891 Unspecified atrial fibrillation: Secondary | ICD-10-CM | POA: Diagnosis not present

## 2019-05-13 DIAGNOSIS — Z7901 Long term (current) use of anticoagulants: Secondary | ICD-10-CM | POA: Diagnosis not present

## 2019-05-13 DIAGNOSIS — Z23 Encounter for immunization: Secondary | ICD-10-CM | POA: Diagnosis not present

## 2019-05-15 ENCOUNTER — Telehealth (HOSPITAL_COMMUNITY): Payer: Self-pay | Admitting: Emergency Medicine

## 2019-05-15 NOTE — Telephone Encounter (Signed)
Left message on voicemail with name and callback number Ileanna Gemmill RN Navigator Cardiac Imaging [facility] [phone] Office [phone] Cell  

## 2019-05-18 ENCOUNTER — Other Ambulatory Visit: Payer: Self-pay

## 2019-05-18 ENCOUNTER — Ambulatory Visit (HOSPITAL_COMMUNITY): Admission: RE | Admit: 2019-05-18 | Payer: Medicare Other | Source: Ambulatory Visit

## 2019-05-18 ENCOUNTER — Ambulatory Visit (HOSPITAL_COMMUNITY)
Admission: RE | Admit: 2019-05-18 | Discharge: 2019-05-18 | Disposition: A | Discharge status: Home / Self Care | Payer: Medicare Other | Source: Ambulatory Visit | Attending: Cardiology | Admitting: Cardiology

## 2019-05-18 DIAGNOSIS — I4819 Other persistent atrial fibrillation: Secondary | ICD-10-CM | POA: Insufficient documentation

## 2019-05-18 MED ORDER — IOHEXOL 350 MG/ML SOLN
80.0000 mL | Freq: Once | INTRAVENOUS | Status: AC | PRN
  Administered 2019-05-18: 80 mL via INTRAVENOUS

## 2019-05-19 ENCOUNTER — Other Ambulatory Visit (HOSPITAL_COMMUNITY)
Admission: RE | Admit: 2019-05-19 | Discharge: 2019-05-19 | Disposition: A | Discharge status: Home / Self Care | Payer: Medicare Other | Source: Ambulatory Visit | Attending: Cardiology | Admitting: Cardiology

## 2019-05-19 DIAGNOSIS — Z20828 Contact with and (suspected) exposure to other viral communicable diseases: Secondary | ICD-10-CM | POA: Insufficient documentation

## 2019-05-19 DIAGNOSIS — Z01812 Encounter for preprocedural laboratory examination: Secondary | ICD-10-CM | POA: Diagnosis not present

## 2019-05-19 DIAGNOSIS — I4891 Unspecified atrial fibrillation: Secondary | ICD-10-CM | POA: Diagnosis not present

## 2019-05-19 DIAGNOSIS — Z7901 Long term (current) use of anticoagulants: Secondary | ICD-10-CM | POA: Diagnosis not present

## 2019-05-19 LAB — PROTIME-INR: INR: 2.1 — AB (ref 0.9–1.1)

## 2019-05-20 ENCOUNTER — Telehealth: Payer: Self-pay | Admitting: Interventional Cardiology

## 2019-05-20 LAB — NOVEL CORONAVIRUS, NAA (HOSP ORDER, SEND-OUT TO REF LAB; TAT 18-24 HRS): SARS-CoV-2, NAA: NOT DETECTED

## 2019-05-20 NOTE — Telephone Encounter (Signed)
Advised pt to continue to monitor for now, per Dr. Curt Bears. Explained that since his HR returned to normal, or decreased to normal range, Dr. Curt Bears would like to try and avoid adding back a BB d/t low HRs previously when on this medication - and since his ablation is this Friday. Advised pt to call back tomorrow if he was still having issues. Patient verbalized understanding and agreeable to plan.

## 2019-05-20 NOTE — Telephone Encounter (Signed)
Dr. Curt Bears please review previous messages below.  Do you want him to restart Lopressor until Friday ablation? Lopressor was stopped at 6/16 OV - per your note:  We will plan to stop his metoprolol as his heart rates are in the 50s. Please advise, thanks

## 2019-05-20 NOTE — Telephone Encounter (Signed)
Pt called back to report that his HR is 98 but he is not feeling as bad... he is resting and feels he should not get up and try to do anything..  I again advised him that he could call EMS if worsens.. he says he spoke with the MD on call and was advised that we could call in some meds for him to reduce the rate.  Will talk with Dr. Delos Haring.

## 2019-05-20 NOTE — Telephone Encounter (Signed)
Left detailed message informing pt Dr. Curt Bears recommends to continue to monitor, no changes at this time.  He does not want to restart a BB d/t low HRs. Asked pt to call office back to speak with me directly

## 2019-05-20 NOTE — Telephone Encounter (Signed)
°  STAT if HR is under 50 or over 120 (normal HR is 60-100 beats per minute)  1) What is your heart rate? 86  2) Do you have a log of your heart rate readings (document readings)?   3) Do you have any other symptoms? Heart Rate is out of rhythm and he just feels awful.  Pt spoke with an ER doctor 2x overnight. The ER doc told the pt what to do to slow his HR, and the advice worked. HE just woke up this morning and his heart was still fast and out of rhythm. Pt is scheduled to have an ablation on Friday. HE wants to be seen to bring his HR down

## 2019-05-20 NOTE — Telephone Encounter (Addendum)
Pt reports that at 9 pm last night he developed heart racing and felt dizzy and sob.. he says he could not count his HR it was too fast and irregular... he could not sleep all night and he he says it finally improved to 86 this morning but he is very weak and can barely stand up.   He is scheduled for an Afib ablation this Friday with Dr. Curt Bears. I have advised him that he if he is too weak to ambulate that he should call EMS... he is not home alone.   He declined and says that the more he sits and rests he is improving but he sounds very anxious and upset on the phone.   Will forward to Dr. Delos Haring for  Review. Pt to go to the ED if anything  worsens prior to my call back.

## 2019-05-21 NOTE — Anesthesia Preprocedure Evaluation (Addendum)
Anesthesia Evaluation  Patient identified by MRN, date of birth, ID band Patient awake    Reviewed: Allergy & Precautions, H&P , NPO status , Patient's Chart, lab work & pertinent test results  Airway Mallampati: II  TM Distance: >3 FB Neck ROM: Full    Dental no notable dental hx. (+) Lower Dentures, Partial Upper, Dental Advisory Given   Pulmonary neg pulmonary ROS, former smoker,    Pulmonary exam normal breath sounds clear to auscultation       Cardiovascular Exercise Tolerance: Good hypertension, Pt. on medications + CAD and + CABG  negative cardio ROS  + dysrhythmias Atrial Fibrillation  Rhythm:Irregular Rate:Normal     Neuro/Psych negative neurological ROS  negative psych ROS   GI/Hepatic negative GI ROS, Elevated LFT's since amiodarone was started. Amiodarone has now been d/c'd.   Endo/Other  negative endocrine ROSHypothyroidism   Renal/GU negative Renal ROS  negative genitourinary   Musculoskeletal   Abdominal   Peds  Hematology negative hematology ROS (+)   Anesthesia Other Findings   Reproductive/Obstetrics negative OB ROS                            Anesthesia Physical Anesthesia Plan  ASA: III  Anesthesia Plan: General   Post-op Pain Management:    Induction: Intravenous  PONV Risk Score and Plan: 3 and Ondansetron, Dexamethasone and Treatment may vary due to age or medical condition  Airway Management Planned: Oral ETT  Additional Equipment:   Intra-op Plan:   Post-operative Plan: Extubation in OR  Informed Consent: I have reviewed the patients History and Physical, chart, labs and discussed the procedure including the risks, benefits and alternatives for the proposed anesthesia with the patient or authorized representative who has indicated his/her understanding and acceptance.     Dental advisory given  Plan Discussed with: CRNA  Anesthesia Plan  Comments:         Anesthesia Quick Evaluation

## 2019-05-22 ENCOUNTER — Ambulatory Visit (HOSPITAL_COMMUNITY): Payer: Medicare Other | Admitting: Anesthesiology

## 2019-05-22 ENCOUNTER — Other Ambulatory Visit: Payer: Self-pay

## 2019-05-22 ENCOUNTER — Observation Stay (HOSPITAL_COMMUNITY)
Admission: RE | Admit: 2019-05-22 | Discharge: 2019-05-23 | Disposition: A | Discharge status: Home / Self Care | Payer: Medicare Other | Attending: Cardiology | Admitting: Cardiology

## 2019-05-22 ENCOUNTER — Encounter (HOSPITAL_COMMUNITY)
Admission: RE | Disposition: A | Discharge status: Home / Self Care | Payer: Medicare Other | Source: Home / Self Care | Attending: Cardiology

## 2019-05-22 ENCOUNTER — Encounter (HOSPITAL_COMMUNITY): Payer: Self-pay | Admitting: Certified Registered"

## 2019-05-22 DIAGNOSIS — Z952 Presence of prosthetic heart valve: Secondary | ICD-10-CM

## 2019-05-22 DIAGNOSIS — Z9889 Other specified postprocedural states: Secondary | ICD-10-CM

## 2019-05-22 DIAGNOSIS — I4891 Unspecified atrial fibrillation: Secondary | ICD-10-CM | POA: Diagnosis not present

## 2019-05-22 DIAGNOSIS — Z79899 Other long term (current) drug therapy: Secondary | ICD-10-CM | POA: Insufficient documentation

## 2019-05-22 DIAGNOSIS — I25810 Atherosclerosis of coronary artery bypass graft(s) without angina pectoris: Secondary | ICD-10-CM | POA: Insufficient documentation

## 2019-05-22 DIAGNOSIS — I483 Typical atrial flutter: Secondary | ICD-10-CM | POA: Insufficient documentation

## 2019-05-22 DIAGNOSIS — E039 Hypothyroidism, unspecified: Secondary | ICD-10-CM | POA: Diagnosis not present

## 2019-05-22 DIAGNOSIS — I25110 Atherosclerotic heart disease of native coronary artery with unstable angina pectoris: Secondary | ICD-10-CM | POA: Diagnosis not present

## 2019-05-22 DIAGNOSIS — Z7901 Long term (current) use of anticoagulants: Secondary | ICD-10-CM | POA: Diagnosis not present

## 2019-05-22 DIAGNOSIS — Z888 Allergy status to other drugs, medicaments and biological substances status: Secondary | ICD-10-CM | POA: Insufficient documentation

## 2019-05-22 DIAGNOSIS — Z885 Allergy status to narcotic agent status: Secondary | ICD-10-CM | POA: Insufficient documentation

## 2019-05-22 DIAGNOSIS — Z951 Presence of aortocoronary bypass graft: Secondary | ICD-10-CM | POA: Insufficient documentation

## 2019-05-22 DIAGNOSIS — I4819 Other persistent atrial fibrillation: Secondary | ICD-10-CM | POA: Diagnosis not present

## 2019-05-22 DIAGNOSIS — I10 Essential (primary) hypertension: Secondary | ICD-10-CM | POA: Diagnosis not present

## 2019-05-22 DIAGNOSIS — Z8679 Personal history of other diseases of the circulatory system: Secondary | ICD-10-CM

## 2019-05-22 HISTORY — PX: ATRIAL FIBRILLATION ABLATION: EP1191

## 2019-05-22 LAB — POCT ACTIVATED CLOTTING TIME
Activated Clotting Time: 208 seconds
Activated Clotting Time: 191 seconds
Activated Clotting Time: 186 seconds

## 2019-05-22 LAB — PROTIME-INR
INR: 2.1 — ABNORMAL HIGH (ref 0.8–1.2)
Prothrombin Time: 22.9 seconds — ABNORMAL HIGH (ref 11.4–15.2)

## 2019-05-22 SURGERY — ATRIAL FIBRILLATION ABLATION
Anesthesia: General

## 2019-05-22 MED ORDER — VITAMIN D 25 MCG (1000 UNIT) PO TABS
2000.0000 [IU] | ORAL_TABLET | Freq: Every day | ORAL | Status: DC
  Administered 2019-05-22 – 2019-05-23 (×2): 2000 [IU] via ORAL
  Filled 2019-05-22 (×2): qty 2

## 2019-05-22 MED ORDER — PROPOFOL 10 MG/ML IV BOLUS
INTRAVENOUS | Status: DC | PRN
  Administered 2019-05-22: 100 mg via INTRAVENOUS

## 2019-05-22 MED ORDER — BUPIVACAINE HCL (PF) 0.25 % IJ SOLN
INTRAMUSCULAR | Status: AC
  Filled 2019-05-22: qty 30

## 2019-05-22 MED ORDER — DOBUTAMINE IN D5W 4-5 MG/ML-% IV SOLN
INTRAVENOUS | Status: DC | PRN
  Administered 2019-05-22: 20 ug/kg/min via INTRAVENOUS

## 2019-05-22 MED ORDER — HEPARIN (PORCINE) IN NACL 1000-0.9 UT/500ML-% IV SOLN
INTRAVENOUS | Status: AC
  Filled 2019-05-22: qty 500

## 2019-05-22 MED ORDER — BUPIVACAINE HCL (PF) 0.25 % IJ SOLN
INTRAMUSCULAR | Status: DC | PRN
  Administered 2019-05-22: 20 mL

## 2019-05-22 MED ORDER — TRAMADOL HCL 50 MG PO TABS: 50.0000 mg | ORAL_TABLET | Freq: Four times a day (QID) | ORAL | Status: DC | PRN

## 2019-05-22 MED ORDER — DEXAMETHASONE SODIUM PHOSPHATE 10 MG/ML IJ SOLN
INTRAMUSCULAR | Status: DC | PRN
  Administered 2019-05-22: 10 mg via INTRAVENOUS

## 2019-05-22 MED ORDER — WARFARIN SODIUM 5 MG PO TABS: 5.0000 mg | ORAL_TABLET | Freq: Every day | ORAL | Status: DC

## 2019-05-22 MED ORDER — HEPARIN SODIUM (PORCINE) 1000 UNIT/ML IJ SOLN
INTRAMUSCULAR | Status: AC
  Filled 2019-05-22: qty 1

## 2019-05-22 MED ORDER — SUGAMMADEX SODIUM 200 MG/2ML IV SOLN
INTRAVENOUS | Status: DC | PRN
  Administered 2019-05-22: 150 mg via INTRAVENOUS

## 2019-05-22 MED ORDER — LEVOTHYROXINE SODIUM 25 MCG PO TABS
125.0000 ug | ORAL_TABLET | Freq: Every day | ORAL | Status: DC
  Administered 2019-05-23: 125 ug via ORAL
  Filled 2019-05-22: qty 1

## 2019-05-22 MED ORDER — LIDOCAINE 2% (20 MG/ML) 5 ML SYRINGE
INTRAMUSCULAR | Status: DC | PRN
  Administered 2019-05-22: 100 mg via INTRAVENOUS

## 2019-05-22 MED ORDER — HEPARIN SODIUM (PORCINE) 1000 UNIT/ML IJ SOLN
INTRAMUSCULAR | Status: DC | PRN
  Administered 2019-05-22: 1000 [IU] via INTRAVENOUS

## 2019-05-22 MED ORDER — WARFARIN - PHARMACIST DOSING INPATIENT: Freq: Every day | Status: DC

## 2019-05-22 MED ORDER — HEPARIN (PORCINE) IN NACL 1000-0.9 UT/500ML-% IV SOLN
INTRAVENOUS | Status: DC | PRN
  Administered 2019-05-22 (×5): 500 mL

## 2019-05-22 MED ORDER — SODIUM CHLORIDE 0.9 % IV SOLN: 250.0000 mL | INTRAVENOUS | Status: DC | PRN

## 2019-05-22 MED ORDER — FINASTERIDE 5 MG PO TABS
5.0000 mg | ORAL_TABLET | Freq: Every day | ORAL | Status: DC
  Administered 2019-05-22: 22:00:00 5 mg via ORAL
  Filled 2019-05-22: qty 1

## 2019-05-22 MED ORDER — PHENYLEPHRINE 40 MCG/ML (10ML) SYRINGE FOR IV PUSH (FOR BLOOD PRESSURE SUPPORT)
PREFILLED_SYRINGE | INTRAVENOUS | Status: DC | PRN
  Administered 2019-05-22 (×2): 40 ug via INTRAVENOUS
  Administered 2019-05-22 (×2): 80 ug via INTRAVENOUS

## 2019-05-22 MED ORDER — DOBUTAMINE IN D5W 4-5 MG/ML-% IV SOLN
INTRAVENOUS | Status: AC
  Filled 2019-05-22: qty 250

## 2019-05-22 MED ORDER — PROTAMINE SULFATE 10 MG/ML IV SOLN
INTRAVENOUS | Status: DC | PRN
  Administered 2019-05-22: 50 mg via INTRAVENOUS

## 2019-05-22 MED ORDER — WARFARIN SODIUM 5 MG PO TABS
5.0000 mg | ORAL_TABLET | Freq: Every day | ORAL | Status: DC
  Administered 2019-05-22: 5 mg via ORAL
  Filled 2019-05-22 (×2): qty 1

## 2019-05-22 MED ORDER — SODIUM CHLORIDE 0.9 % IV SOLN
INTRAVENOUS | Status: DC | PRN
  Administered 2019-05-22: 25 ug/min via INTRAVENOUS

## 2019-05-22 MED ORDER — PANTOPRAZOLE SODIUM 40 MG PO TBEC
40.0000 mg | DELAYED_RELEASE_TABLET | ORAL | Status: DC
  Administered 2019-05-23: 40 mg via ORAL
  Filled 2019-05-22: qty 1

## 2019-05-22 MED ORDER — HEPARIN SODIUM (PORCINE) 1000 UNIT/ML IJ SOLN
INTRAMUSCULAR | Status: DC | PRN
  Administered 2019-05-22: 1000 [IU] via INTRAVENOUS
  Administered 2019-05-22: 13000 [IU] via INTRAVENOUS
  Administered 2019-05-22 (×2): 2000 [IU] via INTRAVENOUS

## 2019-05-22 MED ORDER — ONDANSETRON HCL 4 MG/2ML IJ SOLN
INTRAMUSCULAR | Status: DC | PRN
  Administered 2019-05-22: 4 mg via INTRAVENOUS

## 2019-05-22 MED ORDER — ROCURONIUM BROMIDE 10 MG/ML (PF) SYRINGE
PREFILLED_SYRINGE | INTRAVENOUS | Status: DC | PRN
  Administered 2019-05-22: 50 mg via INTRAVENOUS
  Administered 2019-05-22: 10 mg via INTRAVENOUS

## 2019-05-22 MED ORDER — SODIUM CHLORIDE 0.9 % IV SOLN
INTRAVENOUS | Status: DC
  Administered 2019-05-22 (×2): via INTRAVENOUS

## 2019-05-22 MED ORDER — FERROUS SULFATE 325 (65 FE) MG PO TABS
325.0000 mg | ORAL_TABLET | Freq: Every day | ORAL | Status: DC
  Administered 2019-05-22: 18:00:00 325 mg via ORAL
  Filled 2019-05-22: qty 1

## 2019-05-22 MED ORDER — SODIUM CHLORIDE 0.9% FLUSH
3.0000 mL | Freq: Two times a day (BID) | INTRAVENOUS | Status: DC
  Administered 2019-05-22 – 2019-05-23 (×2): 3 mL via INTRAVENOUS

## 2019-05-22 MED ORDER — ONDANSETRON HCL 4 MG/2ML IJ SOLN: 4.0000 mg | Freq: Four times a day (QID) | INTRAMUSCULAR | Status: DC | PRN

## 2019-05-22 MED ORDER — TAMSULOSIN HCL 0.4 MG PO CAPS
0.4000 mg | ORAL_CAPSULE | Freq: Every day | ORAL | Status: DC
  Administered 2019-05-22: 0.4 mg via ORAL
  Filled 2019-05-22: qty 1

## 2019-05-22 MED ORDER — DIPHENHYDRAMINE HCL 12.5 MG/5ML PO ELIX
12.5000 mg | ORAL_SOLUTION | Freq: Every evening | ORAL | Status: DC | PRN
  Filled 2019-05-22: qty 5

## 2019-05-22 MED ORDER — PHENYLEPHRINE HCL (PRESSORS) 10 MG/ML IV SOLN: INTRAVENOUS | Status: DC | PRN

## 2019-05-22 MED ORDER — DIPHENHYDRAMINE-APAP (SLEEP) 25-500 MG PO TABS: 0.5000 | ORAL_TABLET | Freq: Every evening | ORAL | Status: DC | PRN

## 2019-05-22 MED ORDER — AMLODIPINE BESYLATE 2.5 MG PO TABS
2.5000 mg | ORAL_TABLET | Freq: Every day | ORAL | Status: DC
  Administered 2019-05-22 – 2019-05-23 (×2): 2.5 mg via ORAL
  Filled 2019-05-22 (×2): qty 1

## 2019-05-22 MED ORDER — FENTANYL CITRATE (PF) 250 MCG/5ML IJ SOLN
INTRAMUSCULAR | Status: DC | PRN
  Administered 2019-05-22 (×2): 50 ug via INTRAVENOUS

## 2019-05-22 MED ORDER — SODIUM CHLORIDE 0.9% FLUSH: 3.0000 mL | INTRAVENOUS | Status: DC | PRN

## 2019-05-22 MED ORDER — ACETAMINOPHEN 500 MG PO TABS: 250.0000 mg | ORAL_TABLET | Freq: Every evening | ORAL | Status: DC | PRN

## 2019-05-22 SURGICAL SUPPLY — 21 items
BLANKET WARM UNDERBOD FULL ACC (MISCELLANEOUS) ×3 IMPLANT
CATH MAPPNG PENTARAY F 2-6-2MM (CATHETERS) IMPLANT
CATH SMTCH THERMOCOOL SF DF (CATHETERS) ×2 IMPLANT
CATH SOUNDSTAR 3D IMAGING (CATHETERS) ×2 IMPLANT
CATH WEBSTER BI DIR CS D-F CRV (CATHETERS) ×2 IMPLANT
COVER SWIFTLINK CONNECTOR (BAG) ×3 IMPLANT
PACK EP LATEX FREE (CUSTOM PROCEDURE TRAY) ×3
PACK EP LF (CUSTOM PROCEDURE TRAY) ×1 IMPLANT
PAD PRO RADIOLUCENT 2001M-C (PAD) ×3 IMPLANT
PATCH CARTO3 (PAD) ×2 IMPLANT
PENTARAY F 2-6-2MM (CATHETERS) ×3
SHEATH AVANTI 11F 11CM (SHEATH) ×2 IMPLANT
SHEATH BAYLIS SUREFLEX  M 8.5 (SHEATH) ×2
SHEATH BAYLIS SUREFLEX M 8.5 (SHEATH) IMPLANT
SHEATH BAYLIS TRANSSEPTAL 98CM (NEEDLE) ×2 IMPLANT
SHEATH CARTO VIZIGO SM CVD (SHEATH) ×2 IMPLANT
SHEATH PINNACLE 7F 10CM (SHEATH) ×2 IMPLANT
SHEATH PINNACLE 8F 10CM (SHEATH) ×4 IMPLANT
SHEATH PINNACLE 9F 10CM (SHEATH) ×4 IMPLANT
SHEATH PROBE COVER 6X72 (BAG) ×2 IMPLANT
TUBING SMART ABLATE COOLFLOW (TUBING) ×2 IMPLANT

## 2019-05-22 NOTE — Discharge Instructions (Signed)

## 2019-05-22 NOTE — Transfer of Care (Signed)
Immediate Anesthesia Transfer of Care Note  Patient: Jason Santana  Procedure(s) Performed: ATRIAL FIBRILLATION ABLATION (N/A )  Patient Location: Cath Lab  Anesthesia Type:General  Level of Consciousness: awake, alert , oriented and patient cooperative  Airway & Oxygen Therapy: Patient Spontanous Breathing and Patient connected to nasal cannula oxygen  Post-op Assessment: Report given to RN and Post -op Vital signs reviewed and stable  Post vital signs: Reviewed and stable  Last Vitals:  Vitals Value Taken Time  BP    Temp    Pulse 70 05/22/19 1108  Resp 19 05/22/19 1108  SpO2 98 % 05/22/19 1108  Vitals shown include unvalidated device data.  Last Pain:  Vitals:   05/22/19 0550  TempSrc:   PainSc: 0-No pain      Patients Stated Pain Goal: 3 ([id number])  Complications: No apparent anesthesia complications

## 2019-05-22 NOTE — H&P (Signed)
Jason Santana has presented today for surgery, with the diagnosis of atrial fibrillation.  The various methods of treatment have been discussed with the patient and family. After consideration of risks, benefits and other options for treatment, the patient has consented to  Procedure(s): Catheter ablation as a surgical intervention .  Risks include but not limited to bleeding, tamponade, heart block, stroke, damage to surrounding organs, among others. The patient's history has been reviewed, patient examined, no change in status, stable for surgery.  I have reviewed the patient's chart and labs.  Questions were answered to the patient's satisfaction.    Emylia Latella Curt Bears, MD 05/22/2019 7:09 AM

## 2019-05-22 NOTE — Progress Notes (Signed)
Site area- bilateral  Site Prior to Removal- 0   Pressure Applied For-  25 MInutes   Bedrest Beginning at - 1400   Manual- Yes   Patient Status During Pull- Stable    Post Pull Groin Site- 0   Post Pull Instructions Given- Yes   Post Pull Pulses Present- Yes    Dressing Applied- Tegaderm and Gauze Dressing    Comments:  pulled by k Shakeitha Umbaugh rn and [employer], rn

## 2019-05-22 NOTE — Plan of Care (Signed)
  Problem: Health Behavior/Discharge Planning: Goal: Ability to manage health-related needs will improve Outcome: Progressing   Problem: Clinical Measurements: Goal: Ability to maintain clinical measurements within normal limits will improve Outcome: Progressing   Problem: Clinical Measurements: Goal: Respiratory complications will improve Outcome: Progressing   Problem: Pain Managment: Goal: General experience of comfort will improve Outcome: Progressing

## 2019-05-22 NOTE — Anesthesia Procedure Notes (Signed)
Procedure Name: Intubation Date/Time: 05/22/2019 7:42 AM Performed by: Gaylene Brooks, CRNA Pre-anesthesia Checklist: Patient identified, Emergency Drugs available, Suction available and Patient being monitored Patient Re-evaluated:Patient Re-evaluated prior to induction Oxygen Delivery Method: Circle System Utilized Preoxygenation: Pre-oxygenation with 100% oxygen Induction Type: IV induction Ventilation: Mask ventilation without difficulty Laryngoscope Size: Miller and 2 Grade View: Grade I Tube type: Oral Tube size: 7.5 mm Number of attempts: 1 Airway Equipment and Method: Stylet and Oral airway Placement Confirmation: ETT inserted through vocal cords under direct vision,  positive ETCO2 and breath sounds checked- equal and bilateral Secured at: 23 cm Tube secured with: Tape Dental Injury: Teeth and Oropharynx as per pre-operative assessment

## 2019-05-22 NOTE — Anesthesia Postprocedure Evaluation (Signed)
Anesthesia Post Note  Patient: Jason Santana  Procedure(s) Performed: ATRIAL FIBRILLATION ABLATION (N/A )     Patient location during evaluation: Cath Lab Anesthesia Type: General Level of consciousness: awake and alert Pain management: pain level controlled Vital Signs Assessment: post-procedure vital signs reviewed and stable Respiratory status: spontaneous breathing, nonlabored ventilation, respiratory function stable and patient connected to nasal cannula oxygen Cardiovascular status: blood pressure returned to baseline and stable Postop Assessment: no apparent nausea or vomiting Anesthetic complications: no    Last Vitals:  Vitals:   05/22/19 1139 05/22/19 1145  BP: (!) 108/46   Pulse: 68   Resp: 19   Temp:  (!) 36.3 C  SpO2: 98%     Last Pain:  Vitals:   05/22/19 1145  TempSrc: Temporal  PainSc: 0-No pain                 Shanoah Asbill,W. EDMOND

## 2019-05-22 NOTE — Progress Notes (Signed)
ANTICOAGULATION CONSULT NOTE - Initial Consult  Pharmacy Consult for warfarin Indication: atrial fibrillation  Allergies  Allergen Reactions  . Vicodin [Hydrocodone-Acetaminophen]     Severe sensitivity  . Zocor [Simvastatin]     Short memory loss.  [name] [Olmesartan]     Abdominal cramping and increased stools/ irritable bowel  . Codeine Nausea And Vomiting  . Lopid [Gemfibrozil] Other (See Comments)    transaminitis  . Monopril [Fosinopril]     transaminitis  . Phenergan [Promethazine Hcl]     Severe somnolence.  . Prilosec [Omeprazole]     dyspepsia    Patient Measurements: Height: 5\' 10"  (177.8 cm) Weight: 166 lb (75.3 kg) IBW/kg (Calculated) : 73   Vital Signs: Temp: 97.3 F (36.3 C) (09/18 1145) Temp Source: Temporal (09/18 1145) BP: 135/58 (09/18 1350) Pulse Rate: 67 (09/18 1350)  Labs: Recent Labs    05/22/19 0603  LABPROT 22.9*  INR 2.1*    CrCl cannot be calculated (Patient's most recent lab result is older than the maximum 21 days allowed.).   Medical History: Past Medical History:  Diagnosis Date  . Atrial fibrillation ([place])   . CAD (coronary artery disease) of artery bypass graft 2000   2000 with multiple bypasses   . Chronic anticoagulation    Coumadin therapy for mechanical aortic valve. Postoperative atrial fibrillation.   . Essential hypertension   . H/O mechanical aortic valve replacement 2000   [employer] AVR  . Hypothyroidism   . Premature ventricular contractions    Assessment: 85 year old male with history of afib and mechanical AVR on warfarin therapy now s/p ablation this morning.   Patient previously had higher INR goal of 2.5-3.5 but had a bleeding episode so it was lowered. No CBC done today.   INR 2.1 today, will continue home dose of warfarin.  Goal of Therapy:  INR 2-3 Monitor platelets by anticoagulation protocol: Yes   Plan:  Warfarin 5mg  daily INR in am if still here  Erin Hearing PharmD., BCPS Clinical  Pharmacist 05/22/2019 2:33 PM

## 2019-05-23 ENCOUNTER — Encounter (HOSPITAL_COMMUNITY): Payer: Self-pay | Admitting: Cardiology

## 2019-05-23 DIAGNOSIS — Z8679 Personal history of other diseases of the circulatory system: Secondary | ICD-10-CM

## 2019-05-23 DIAGNOSIS — Z951 Presence of aortocoronary bypass graft: Secondary | ICD-10-CM | POA: Diagnosis not present

## 2019-05-23 DIAGNOSIS — I4819 Other persistent atrial fibrillation: Principal | ICD-10-CM

## 2019-05-23 DIAGNOSIS — Z7901 Long term (current) use of anticoagulants: Secondary | ICD-10-CM | POA: Diagnosis not present

## 2019-05-23 DIAGNOSIS — I483 Typical atrial flutter: Secondary | ICD-10-CM | POA: Diagnosis not present

## 2019-05-23 DIAGNOSIS — Z79899 Other long term (current) drug therapy: Secondary | ICD-10-CM | POA: Diagnosis not present

## 2019-05-23 DIAGNOSIS — Z9889 Other specified postprocedural states: Secondary | ICD-10-CM

## 2019-05-23 DIAGNOSIS — Z885 Allergy status to narcotic agent status: Secondary | ICD-10-CM | POA: Diagnosis not present

## 2019-05-23 DIAGNOSIS — Z888 Allergy status to other drugs, medicaments and biological substances status: Secondary | ICD-10-CM | POA: Diagnosis not present

## 2019-05-23 DIAGNOSIS — I25810 Atherosclerosis of coronary artery bypass graft(s) without angina pectoris: Secondary | ICD-10-CM | POA: Diagnosis not present

## 2019-05-23 HISTORY — DX: Personal history of other diseases of the circulatory system: Z86.79

## 2019-05-23 HISTORY — DX: Long term (current) use of anticoagulants: Z79.01

## 2019-05-23 HISTORY — DX: Typical atrial flutter: I48.3

## 2019-05-23 HISTORY — DX: Other specified postprocedural states: Z98.890

## 2019-05-23 LAB — PROTIME-INR
INR: 2.5 — ABNORMAL HIGH (ref 0.8–1.2)
Prothrombin Time: 26.5 seconds — ABNORMAL HIGH (ref 11.4–15.2)

## 2019-05-23 NOTE — Discharge Summary (Addendum)
Discharge Summary    Patient ID: Jason Santana MRN: [id number]; DOB: 1931-09-22  Admit date: 05/22/2019 Discharge date: 05/23/2019  Primary Care Provider: [name], MD  Primary Cardiologist: Sinclair Grooms, MD  Primary Electrophysiologist:  Constance Haw, MD   Discharge Diagnoses    Principal Problem:   Atrial fibrillation [facility]) Active Problems:   S/P ablation of atrial fibrillation 05/22/19   H/O mechanical aortic valve replacement   CAD (coronary artery disease) of artery bypass graft   Anticoagulation adequate   Typical atrial flutter ([facility])   Allergies Allergies  Allergen Reactions   Lopid [Gemfibrozil] Other (See Comments)    transaminitis   Monopril [Fosinopril] Other (See Comments)    transaminitis   Vicodin [Hydrocodone-Acetaminophen]     Severe sensitivity   Phenergan [Promethazine Hcl] Other (See Comments)    Severe somnolence.   Zocor [Simvastatin]     Short memory loss.   Benicar [Olmesartan]     Abdominal cramping and increased stools/ irritable bowel   Codeine Nausea And Vomiting   Prilosec [Omeprazole]     dyspepsia    Diagnostic Studies/Procedures    PREPROCEDURE DIAGNOSES: 1. Persistent atrial fibrillation. 2. Typical appearing atrial flutter  POSTPROCEDURE DIAGNOSES: 1. Persistent atrial fibrillation. 2. Typical appearing atrial flutter  PROCEDURES: 1. Comprehensive electrophysiologic study. 2. Coronary sinus pacing and recording. 3. Three-dimensional mapping of atrial fibrillation with additional mapping and ablation of a second discrete focus (atrial flutter) 4. Ablation of atrial fibrillation with additional mapping and ablation of a second discrete focus (atrial flutter) 5. Intracardiac echocardiography. 6. Transseptal puncture of an intact septum. 7. Arrhythmia induction with pacing with isuprel infusion  INTRODUCTION:  Jason Santana is a 85 y.o. male with a history of paroxysmal atrial fibrillation and typical  appearing atrial flutter who now presents for EP study and radiofrequency ablation.  The patient reports initially being diagnosed with atrial fibrillation after presenting with symptomatic palpitations and fatgiue. The patient reports increasing frequency and duration of atrial arrhythmias since that time.  The patient has failed medical therapy with amiodarone.  The patient therefore presents today for catheter ablation of atrial fibrillation and atrial flutter.  DESCRIPTION OF PROCEDURE:  Informed written consent was obtained, and the patient was brought to the electrophysiology lab in a fasting state.  The patient was adequately sedated with intravenous medications as outlined in the anesthesia report.  The patient's left and right groins were prepped and draped in the usual sterile fashion by the EP lab staff.  Using a percutaneous Seldinger technique, two 8-French hemostasis sheaths were placed in the right common femoral vein; one 7-French and one 11-French hemostasis sheaths were placed into the left common femoral vein.    Catheter Placement:  A 7-French Biosense Webster Decapolar coronary sinus catheter was introduced through the right common femoral vein and advanced into the coronary sinus for recording and pacing from this location.    Initial Measurements: The patient presented to the electrophysiology lab in atrial flutter.  The patients QRS duration of 133 msec and a QT interval of 437 msec.    Intracardiac Echocardiography: A 10-French Biosense Webster AcuNav intracardiac echocardiography catheter was introduced through the right common femoral vein and advanced into the right atrium. Intracardiac echocardiography was performed of the left atrium, and a three-dimensional anatomical rendering of the left atrium was performed using CARTO sound technology.  The patient was noted to have a moderate sized left atrium.  The interatrial septum was prominent but not aneurysmal. All  4  pulmonary veins were visualized and noted to have separate ostia.  The pulmonary veins were moderate in size.  The left atrial appendage was visualized and did not reveal thrombus.   There was no evidence of pulmonary vein stenosis.   Transseptal Puncture: The right common femoral vein sheaths were was exchanged for an 8.5 [place] Agillis transseptal sheath and transseptal access was achieved in a standard fashion using a Brockenbrough needle under biplane fluoroscopy with intracardiac echocardiography confirmation of the transseptal puncture.  Once transseptal access had been achieved, heparin was administered intravenously and intra- arterially in order to maintain an ACT of greater than 350 seconds throughout the procedure.   3D Mapping and Ablation: A 3.5 mm Biosense Webster SmartTouch Thermocool ablation catheter was advanced into the right atrium.  The transseptal sheath was pulled back into the IVC over a guidewire.  The ablation catheter was advanced across the transseptal hole using the wire as a guide.  The transseptal sheath was then re-advanced over the guidewire into the left atrium.  A duodecapolar Biosense Webster circular mapping catheter was introduced through the transseptal sheath and positioned over the mouth of all 4 pulmonary veins.  Three-dimensional electroanatomical mapping was performed using CARTO technology.  This demonstrated electrical activity within all four pulmonary veins at baseline. The patient underwent successful sequential electrical isolation and anatomical encircling of all four pulmonary veins using radiofrequency current with a circular mapping catheter as a guide.  A WACA approach was used.  Entrance and exit block were achieved.  The ablation catheter was then pulled back into the right atrial and positioned along the cavo-tricuspid isthmus.  Mapping along the atrial side of the isthmus was performed.  This demonstrated a standard isthmus.  A series of  radiofrequency applications were then delivered along the isthmus.  Complete bidirectional cavotricuspid isthmus block was achieved as confirmed by differential atrial pacing from the low lateral right atrium.  A stimulus to earliest atrial activation across the isthmus measured 180 msec bi-directionally.  The patient was observe without return of conduction through the isthmus.  Measurements Following Ablation: Following ablation, dobutamine was infused up to 20 mcg/min with no inducible atrial fibrillation, atrial tachycardia, atrial flutter, or sustained PACs. In sinus rhythm with RR interval was 848 msec, with PR 132 msec, QRS 108 msec, and Qt 486 msec.  Following ablation the AH interval measured 87 msec with an HV interval of 38 msec. Ventricular pacing was performed, which revealed midline concentric decremental VA conduction with a VA WCL of 510 msec.  Rapid atrial pacing was performed, which revealed an AV Wenckebach cycle length of 440 msec.  Electroisolation was then again confirmed in all four pulmonary veins.  Intracardiac echocardiography was again performed, which revealed no pericardial effusion.  The procedure was therefore considered completed.  All catheters were removed, and the sheaths were aspirated and flushed.  The patient was transferred to the recovery area for sheath removal per protocol.  A limited bedside transthoracic echocardiogram revealed no pericardial effusion. EBL<89ml.  There were no early apparent complications.  CONCLUSIONS: 1. Sinus rhythm upon presentation.   2. Successful electrical isolation and anatomical encircling of all four pulmonary veins with radiofrequency current.    3. Cavo-tricuspid isthmus ablation was performed with complete bidirectional isthmus block achieved.  4. No inducible arrhythmias following ablation both on and off of dobutamine 5. No early apparent complications. _____________   History of Present Illness     11 YOM seen by Dr.  Curt Bears for  atrial fib.  He has hx of AVR with mechanical aortic valve replacement in 2000 and CABG.  GI bleed in 01/2018 with pause in anticoagulation.   Over last 6 months he has had 3 DCCVs last one was 8/7//20.  Pt on amiodarone at 400 mg daily.   He has labs with elevated LFTs and TSH.  Amiodarone was stopped 05/05/19.  Ablation was discussed with pt and he was agreeable to proceed after risks were discussed.    CHA2DS2Vasc of 4.  His mechanical Aortic valve was stable on most recent echo.  CAD is stable and no angina.    Pt electively admitted 05/22/19 for ablation that he underwent without complications.    Hospital Course     Consultants: none   Pt underwent procedure without complications.  He did complain of some numbness on lt hand.  He has been seen and evaluated by Dr. Lovena Le and found stable for discharge.    INR is 2.5 EKG SR with LAFB  _____________  Discharge Vitals Blood pressure (!) 148/63, pulse 69, temperature (!) 97.4 F (36.3 C), temperature source Oral, resp. rate 20, height 5\' 10"  (1.778 m), weight 78.2 kg, SpO2 100 %.  Filed Weights   05/22/19 0530 05/23/19 0542  Weight: 75.3 kg 78.2 kg   General:Pleasant affect, NAD Skin:Warm and dry, brisk capillary refill HEENT:normocephalic, sclera clear, mucus membranes moist Neck:supple, no JVD, no bruits  Heart:S1S2 RRR without murmur, gallup, rub or click Lungs:clear without rales, rhonchi, or wheezes [id number], non tender, + BS, do not palpate liver spleen or masses Ext:no lower ext edema, no hematoma Neuro:alert and oriented, MAE, follows commands, + facial symmetry  Labs & Radiologic Studies    CBC No results for input(s): WBC, NEUTROABS, HGB, HCT, MCV, PLT in the last 72 hours. Basic Metabolic Panel No results for input(s): NA, K, CL, CO2, GLUCOSE, BUN, CREATININE, CALCIUM, MG, PHOS in the last 72 hours. Liver Function Tests No results for input(s): AST, ALT, ALKPHOS, BILITOT, PROT, ALBUMIN in the last 72  hours. No results for input(s): LIPASE, AMYLASE in the last 72 hours. High Sensitivity Troponin:   No results for input(s): TROPONINIHS in the last 720 hours.  BNP Invalid input(s): POCBNP D-Dimer No results for input(s): DDIMER in the last 72 hours. Hemoglobin A1C No results for input(s): HGBA1C in the last 72 hours. Fasting Lipid Panel No results for input(s): CHOL, HDL, LDLCALC, TRIG, CHOLHDL, LDLDIRECT in the last 72 hours. Thyroid Function Tests No results for input(s): TSH, T4TOTAL, T3FREE, THYROIDAB in the last 72 hours.  Invalid input(s): FREET3 _____________  US Abdomen Complete  Result Date: 04/24/2019 CLINICAL DATA:  Elevated liver enzymes EXAM: ABDOMEN ULTRASOUND COMPLETE COMPARISON:  CT abdomen and pelvis February 26, 2018 FINDINGS: Gallbladder: Surgically absent. Common bile duct: Diameter: 2 mm. No intrahepatic, common hepatic, or common bile dilatation. Liver: No focal lesion identified. Within normal limits in parenchymal echogenicity. Portal vein is patent on color Doppler imaging with normal direction of blood flow towards the liver. IVC: No abnormality visualized. Pancreas: Visualized portion unremarkable. Portions of pancreas obscured by gas. Spleen: Size and appearance within normal limits. Right Kidney: Length: 10.6 cm. Echogenicity within normal limits. No hydronephrosis visualized. There is a cyst in the right kidney measuring 1.3 x 0.7 x 0.7 cm. Left Kidney: Length: 11.2 cm. Echogenicity within normal limits. No hydronephrosis visualized. There is a cyst in the mid kidney measuring 1.9 x 1.5 x 1.3 cm. There are scattered subcentimeter cysts in the left kidney is  well. Abdominal aorta: No aneurysm visualized. Other findings: No demonstrable ascites. IMPRESSION: 1.  Gallbladder absent. 2. Portions of pancreas obscured by gas. Visualized portions of pancreas appear normal. 3.  Small renal cysts bilaterally. 4.  Study otherwise unremarkable. Electronically Signed   By: Lowella Grip III M.D.   On: 04/24/2019 09:44   Ct Cardiac Morph/pulm Vein W/cm&w/o Ca Score  Addendum Date: 05/18/2019   ADDENDUM REPORT: 05/18/2019 23:30 CLINICAL DATA:  85 year old atrial fibrillation scheduled for an ablation. EXAM: Cardiac CT/CTA TECHNIQUE: The patient was scanned on a Siemens Somatom scanner. FINDINGS: A 120 kV prospective scan was triggered in the descending thoracic aorta at 111 HU's. Gantry rotation speed was 280 msecs and collimation was .9 mm. No beta blockade and no NTG was given. The 3D data set was reconstructed in 5% intervals of the 60-80 % of the R-R cycle. Diastolic phases were analyzed on a dedicated work station using MPR, MIP and VRT modes. The patient received 80 cc of contrast. There is normal pulmonary vein drainage into the left atrium (2 on the right and 2 on the left) with ostial measurements as follows: RUPV: 22.0 x 17.4 mm RLPV: 20.5 x 17.9 mm LUPV: 19.5 x 14.0 mm LLPV: 15.7 x 14.3 mm The left atrial appendage is large with no evidence for a thrombus. The esophagus runs to the left from the left atrial midline and is in the proximity to the ostium of the LLPV. Aorta: Normal caliber with maximum diameter 39 mm. No dissection, moderate diffuse atherosclerotic disease and calcifications. Aortic Valve: Mechanical bileaflet valve with good unrestricted leaflet excursions. IMPRESSION: 1. There is normal pulmonary vein drainage into the left atrium. 2. The left atrial appendage is large with no evidence for a thrombus. 3. The esophagus runs to the left from the left atrial midline and is in the proximity to the ostium of the LLPV. 4. Aortic Valve: Mechanical bileaflet valve with good unrestricted leaflet excursions. Electronically Signed   By: Ena Dawley   On: 05/18/2019 23:30   Result Date: 05/18/2019 EXAM: OVER-READ INTERPRETATION  CT CHEST The following report is an over-read performed by radiologist Dr. Rolm Baptise of [facility], [place] on 05/18/2019. This  over-read does not include interpretation of cardiac or coronary anatomy or pathology. The coronary CTA interpretation by the cardiologist is attached. COMPARISON:  None. FINDINGS: Vascular: Prior CABG. Heart is upper limits normal in size. Ascending thoracic aorta slightly aneurysmal at 4 cm. Aortic atherosclerosis. Prior aortic valve repair. Mediastinum/Nodes: No adenopathy in the lower mediastinum or hila. Lungs/Pleura: Emphysematous changes in the visualized lungs. No confluent opacities or effusions. Upper Abdomen: Imaging into the upper abdomen shows no acute findings. Musculoskeletal: Chest wall soft tissues are unremarkable. No acute bony abnormality. IMPRESSION: Aortic atherosclerosis. Prior CABG and aortic valve replacement. Emphysema. 4 cm ascending thoracic aortic aneurysm. Recommend annual imaging followup by CTA or MRA. This recommendation follows 2010 ACCF/AHA/AATS/ACR/ASA/SCA/SCAI/SIR/STS/SVM Guidelines for the Diagnosis and Management of Patients with Thoracic Aortic Disease. Circulation. 2010; [id number]. Aortic aneurysm NOS (ICD10-I71.9) Electronically Signed: By: Rolm Baptise M.D. On: 05/18/2019 11:03   Disposition   Pt is being discharged home today in good condition.  Follow-up Plans & Appointments    Follow-up Information    Constance Haw, MD Follow up on 08/20/2019.   Specialty: Cardiology Why: at 1030 for 3 month post ablation follow up Contact information: [place] [id number] [phone]        [facility] Follow up on 06/19/2019.   Specialty: Cardiology Why: at 0930 for 1 month post ablation follow up Contact information: [address] [id number] [place]       [name], MD Follow up.   Specialty: Internal Medicine Why: next week for INR check of coumadin.  Contact information: [address] [place]   [id number] [phone]        Belva Crome, MD .   Specialty: Cardiology Contact information: [phone] [place] [address] [phone]         post ablation information given.    Discharge Medications   Allergies as of 05/23/2019      Reactions   Lopid [gemfibrozil] Other (See Comments)   transaminitis   Monopril [fosinopril] Other (See Comments)   transaminitis   Vicodin [hydrocodone-acetaminophen]    Severe sensitivity   Phenergan [promethazine Hcl] Other (See Comments)   Severe somnolence.   Zocor [simvastatin]    Short memory loss.   Benicar [olmesartan]    Abdominal cramping and increased stools/ irritable bowel   Codeine Nausea And Vomiting   Prilosec [omeprazole]    dyspepsia      Medication List    TAKE these medications   amLODipine 2.5 MG tablet Commonly known as: NORVASC Take 2.5 mg by mouth daily.   diphenhydramine-acetaminophen 25-500 MG Tabs tablet Commonly known as: TYLENOL PM Take 0.5 tablets by mouth at bedtime as needed (sleep).   ferrous sulfate 325 (65 FE) MG tablet Take 325 mg by mouth daily with supper.   finasteride 5 MG tablet Commonly known as: PROSCAR Take 5 mg by mouth at bedtime.   levothyroxine 125 MCG tablet Commonly known as: SYNTHROID Take 125 mcg by mouth daily before breakfast.   pantoprazole 40 MG tablet Commonly known as: PROTONIX Take 40 mg by mouth 4 (four) times a week.   PreserVision AREDS 2 Caps Take 1 capsule by mouth 2 (two) times daily.   SYSTANE OP Place 1 drop into both eyes daily as needed (dry eyes).   tamsulosin 0.4 MG Caps capsule Commonly known as: FLOMAX Take 0.4 mg by mouth at bedtime.   tobramycin 0.3 % ophthalmic solution Commonly known as: TOBREX Place 1 drop into the left eye See admin instructions. Instill 1 drop into left eye 1 day before, the day of, and the day after eye injections administered at Dr's office   traMADol 50 MG tablet Commonly known as:  Ultram Take 1 tablet (50 mg total) by mouth every 6 (six) hours as needed for moderate pain.   Vitamin D 50 MCG (2000 UT) tablet Take 2,000 Units by mouth daily.   warfarin 5 MG tablet Commonly known as: COUMADIN Take 5 mg by mouth daily.        Acute coronary syndrome (MI, NSTEMI, STEMI, etc) this admission?: No.    Outstanding Labs/Studies   none  Duration of Discharge Encounter   Greater than 30 minutes including physician time.  Signed, Cecilie Kicks, NP 05/23/2019, 10:37 AM   EP Attending  Patient seen and examined. Agree with the findings as noted above. The patient is doing well, s/p ablation and appears to be maintaining NSR. He is stable for DC with followup as noted above.  Mikle Bosworth.D.

## 2019-05-23 NOTE — Progress Notes (Signed)
Patient says his fingers are feeling a little numb on his left land. Will continue to monitor.

## 2019-05-25 DIAGNOSIS — I4891 Unspecified atrial fibrillation: Secondary | ICD-10-CM | POA: Diagnosis not present

## 2019-05-25 DIAGNOSIS — Z7901 Long term (current) use of anticoagulants: Secondary | ICD-10-CM | POA: Diagnosis not present

## 2019-05-26 LAB — POCT ACTIVATED CLOTTING TIME
Activated Clotting Time: 329 seconds
Activated Clotting Time: 334 seconds
Activated Clotting Time: 345 seconds

## 2019-05-28 ENCOUNTER — Telehealth: Payer: Self-pay | Admitting: Interventional Cardiology

## 2019-05-28 NOTE — Telephone Encounter (Signed)
Pt reports HRs "jumping around".   States it was around 60 or so in the morning but jumped up to 100 yesterday evening.  This didn't last too long and the only complaint he has was he could "feel and hear it in my ear".  No other symptoms experienced. Educated pt to breakthrough AFib post procedure.  Discussed continued monitoring at this time. Advised to call office if this issue persists, HRs increase above 100 and don't come back down after period of time and/or symptoms begin.   Pt voices that he feels better just speaking with me and getting the information given above.  He better understands situation and what to watch for.  He is agreeable to keep monitoring and calling back if needed. He appreciates my return call and speaking with him.  Will forward to Dr. Curt Bears for his [facility]

## 2019-05-28 NOTE — Telephone Encounter (Signed)
  STAT if HR is under 50 or over 120 (normal HR is 60-100 beats per minute)  1) What is your heart rate? Patient states it got over 100 last night  2) Do you have a log of your heart rate readings (document readings)? no  3) Do you have any other symptoms? No  Patient is concerned because his heart rate keeps going up and down.

## 2019-05-30 ENCOUNTER — Emergency Department (HOSPITAL_COMMUNITY): Payer: Medicare Other

## 2019-05-30 ENCOUNTER — Encounter (HOSPITAL_COMMUNITY): Payer: Self-pay | Admitting: Emergency Medicine

## 2019-05-30 ENCOUNTER — Telehealth: Payer: Self-pay | Admitting: Cardiology

## 2019-05-30 ENCOUNTER — Emergency Department (HOSPITAL_COMMUNITY)
Admission: EM | Admit: 2019-05-30 | Discharge: 2019-05-31 | Disposition: A | Discharge status: Home / Self Care | Payer: Medicare Other | Attending: Emergency Medicine | Admitting: Emergency Medicine

## 2019-05-30 DIAGNOSIS — R5381 Other malaise: Secondary | ICD-10-CM | POA: Diagnosis not present

## 2019-05-30 DIAGNOSIS — M79672 Pain in left foot: Secondary | ICD-10-CM | POA: Diagnosis not present

## 2019-05-30 DIAGNOSIS — Z87891 Personal history of nicotine dependence: Secondary | ICD-10-CM | POA: Insufficient documentation

## 2019-05-30 DIAGNOSIS — I10 Essential (primary) hypertension: Secondary | ICD-10-CM | POA: Insufficient documentation

## 2019-05-30 DIAGNOSIS — Z951 Presence of aortocoronary bypass graft: Secondary | ICD-10-CM | POA: Insufficient documentation

## 2019-05-30 DIAGNOSIS — Z7901 Long term (current) use of anticoagulants: Secondary | ICD-10-CM | POA: Insufficient documentation

## 2019-05-30 DIAGNOSIS — M7732 Calcaneal spur, left foot: Secondary | ICD-10-CM | POA: Diagnosis not present

## 2019-05-30 DIAGNOSIS — I4891 Unspecified atrial fibrillation: Secondary | ICD-10-CM | POA: Insufficient documentation

## 2019-05-30 DIAGNOSIS — R52 Pain, unspecified: Secondary | ICD-10-CM | POA: Diagnosis not present

## 2019-05-30 DIAGNOSIS — Z79899 Other long term (current) drug therapy: Secondary | ICD-10-CM | POA: Diagnosis not present

## 2019-05-30 DIAGNOSIS — E039 Hypothyroidism, unspecified: Secondary | ICD-10-CM | POA: Diagnosis not present

## 2019-05-30 NOTE — ED Triage Notes (Signed)
Pt transported from home by EMS with c/o L heel/foot pain onset 2000 while eating dinner. Pt denies injury, +CMS. Pt states feels better when elevated. Pt reports he called MD line and was told to come to ED asap for stroke workup. Pt is neuro intact.

## 2019-05-30 NOTE — Telephone Encounter (Signed)
I returned a call to the patient. He is an 85 year old man with a history of atrial fibrillation/flutter who underwent catheter ablation last week.  He states that this evening he experienced sudden onset L leg weakness with difficulty moving his left foot. He denies any other neurologic symptoms or other complaints at all. He had no issues following the procedure.  Due to the unclear nature of his symptoms and inability to exclude stroke, I advised him to call 911 immediately.

## 2019-05-31 DIAGNOSIS — M79672 Pain in left foot: Secondary | ICD-10-CM | POA: Diagnosis not present

## 2019-05-31 LAB — CBC WITH DIFFERENTIAL/PLATELET
Abs Immature Granulocytes: 0.02 10*3/uL (ref 0.00–0.07)
Basophils Absolute: 0 10*3/uL (ref 0.0–0.1)
Basophils Relative: 0 %
Eosinophils Absolute: 0.2 10*3/uL (ref 0.0–0.5)
Eosinophils Relative: 4 %
HCT: 39 % (ref 39.0–52.0)
Hemoglobin: 13.1 g/dL (ref 13.0–17.0)
Immature Granulocytes: 0 %
Lymphocytes Relative: 16 %
Lymphs Abs: 0.9 10*3/uL (ref 0.7–4.0)
MCH: 34.7 pg — ABNORMAL HIGH (ref 26.0–34.0)
MCHC: 33.6 g/dL (ref 30.0–36.0)
MCV: 103.4 fL — ABNORMAL HIGH (ref 80.0–100.0)
Monocytes Absolute: 1 10*3/uL (ref 0.1–1.0)
Monocytes Relative: 19 %
Neutro Abs: 3.3 10*3/uL (ref 1.7–7.7)
Neutrophils Relative %: 61 %
Platelets: 192 10*3/uL (ref 150–400)
RBC: 3.77 MIL/uL — ABNORMAL LOW (ref 4.22–5.81)
RDW: 15.3 % (ref 11.5–15.5)
WBC: 5.5 10*3/uL (ref 4.0–10.5)
nRBC: 0 % (ref 0.0–0.2)

## 2019-05-31 LAB — PROTIME-INR
INR: 2.3 — ABNORMAL HIGH (ref 0.8–1.2)
Prothrombin Time: 25.2 seconds — ABNORMAL HIGH (ref 11.4–15.2)

## 2019-05-31 LAB — BASIC METABOLIC PANEL
Anion gap: 10 (ref 5–15)
BUN: 12 mg/dL (ref 8–23)
CO2: 21 mmol/L — ABNORMAL LOW (ref 22–32)
Calcium: 9 mg/dL (ref 8.9–10.3)
Chloride: 105 mmol/L (ref 98–111)
Creatinine, Ser: 1.15 mg/dL (ref 0.61–1.24)
GFR calc Af Amer: >60 mL/min (ref >60)
GFR calc non Af Amer: 58 mL/min — ABNORMAL LOW (ref >60)
Glucose, Bld: 128 mg/dL — ABNORMAL HIGH (ref 70–99)
Potassium: 4 mmol/L (ref 3.5–5.1)
Sodium: 136 mmol/L (ref 135–145)

## 2019-05-31 NOTE — ED Provider Notes (Signed)
[facility] EMERGENCY DEPARTMENT Provider Note   CSN: [id number] Arrival date & time: 05/30/19  2242     History   Chief Complaint Chief Complaint  Patient presents with  . Foot Pain    HPI Jason Santana is a 85 y.o. male with past medical history significant for atrial fibrillation, atrial flutter, CAD, and HTN who presents to the ED via EMS for acute onset left foot and heel discomfort.  Given that he underwent catheter ablation last week and he had a sudden onset pain and reported weakness and inability to move his left foot, he was advised to come to the ED for stroke evaluation.  When asked about the difficulty moving foot, he clarified that he simply could not bear weight on the foot due to significant discomfort, but that he maintained all sensory and motor function.  He states that he has been feeling very well since his ablation procedure last week.  He states that the pain in his heel is worse with ambulation and is relieved with elevation.  He took a Tylenol PM, but with little relief.     HPI  Past Medical History:  Diagnosis Date  . Anticoagulation adequate 05/23/2019  . Atrial fibrillation ([place])   . CAD (coronary artery disease) of artery bypass graft 2000   2000 with multiple bypasses   . Chronic anticoagulation    Coumadin therapy for mechanical aortic valve. Postoperative atrial fibrillation.   . Essential hypertension   . H/O mechanical aortic valve replacement 2000   St Jude AVR  . Hypothyroidism   . Premature ventricular contractions   . S/P ablation of atrial fibrillation 05/22/19 05/23/2019  . Typical atrial flutter ([place]) 05/23/2019    Patient Active Problem List   Diagnosis Date Noted  . Anticoagulation adequate 05/23/2019  . Typical atrial flutter ([place]) 05/23/2019  . S/P ablation of atrial fibrillation 05/22/19 05/23/2019  . Atrial fibrillation ([place]) 05/22/2019  . Chest pain 08/11/2018  . Syncope 01/04/2018  . Coronary artery disease  involving native coronary artery of native heart with unstable angina pectoris ([place]) 01/04/2018  . GI bleed 01/03/2018  . Macular degeneration of left eye 05/26/2017  . Hypothyroidism 05/26/2017  . Hyponatremia 05/25/2017  . Sepsis ([place]) 05/25/2017  . Muscle weakness 12/13/2014  . Paroxysmal atrial flutter ([place]) 11/25/2013  . Essential hypertension 11/25/2013  . H/O mechanical aortic valve replacement 11/25/2013  . CAD (coronary artery disease) of artery bypass graft 11/25/2013  . Chronic anticoagulation 11/25/2013  . Premature ventricular contractions 11/25/2013    Past Surgical History:  Procedure Laterality Date  . AORTIC VALVE REPLACEMENT (AVR)/CORONARY ARTERY BYPASS GRAFTING (CABG)  2000  . ATRIAL FIBRILLATION ABLATION N/A 05/22/2019   Procedure: ATRIAL FIBRILLATION ABLATION;  Surgeon: Constance Haw, MD;  Location: [facility] CV LAB;  Service: Cardiovascular;  Laterality: N/A;  . CARDIOVERSION N/A 02/24/2018   Procedure: CARDIOVERSION;  Surgeon: Fay Records, MD;  Location: [facility] ENDOSCOPY;  Service: Cardiovascular;  Laterality: N/A;  . CARDIOVERSION N/A 08/15/2018   Procedure: CARDIOVERSION;  Surgeon: Thayer Headings, MD;  Location: [facility];  Service: Cardiovascular;  Laterality: N/A;  . COLONOSCOPY WITH PROPOFOL N/A 01/08/2018   Procedure: COLONOSCOPY WITH PROPOFOL;  Surgeon: Arta Silence, MD;  Location: [facility] ENDOSCOPY;  Service: Gastroenterology;  Laterality: N/A;  . CORONARY/GRAFT ANGIOGRAPHY N/A 08/14/2018   Procedure: CORONARY/GRAFT ANGIOGRAPHY;  Surgeon: [name], MD;  Location: [facility] CV LAB;  Service: Cardiovascular;  Laterality: N/A;  . ESOPHAGOGASTRODUODENOSCOPY (EGD) WITH PROPOFOL N/A  01/06/2018   Procedure: ESOPHAGOGASTRODUODENOSCOPY (EGD) WITH PROPOFOL;  Surgeon: Otis Brace, MD;  Location: [facility] ENDOSCOPY;  Service: Gastroenterology;  Laterality: N/A;        Home Medications    Prior to Admission medications   Medication Sig Start Date  End Date Taking? Authorizing Provider  amLODipine (NORVASC) 2.5 MG tablet Take 2.5 mg by mouth daily.  04/27/19   [provider]  Cholecalciferol (VITAMIN D) 2000 units tablet Take 2,000 Units by mouth daily.    [provider]  diphenhydramine-acetaminophen (TYLENOL PM) 25-500 MG TABS tablet Take 0.5 tablets by mouth at bedtime as needed (sleep).    [provider]  ferrous sulfate 325 (65 FE) MG tablet Take 325 mg by mouth daily with supper.    [provider]  finasteride (PROSCAR) 5 MG tablet Take 5 mg by mouth at bedtime.  10/07/17   [provider]  levothyroxine (SYNTHROID) 125 MCG tablet Take 125 mcg by mouth daily before breakfast.  11/21/18   [provider]  Multiple Vitamins-Minerals (PRESERVISION AREDS 2) CAPS Take 1 capsule by mouth 2 (two) times daily.    [provider]  pantoprazole (PROTONIX) 40 MG tablet Take 40 mg by mouth 4 (four) times a week.    [provider]  Polyethyl Glycol-Propyl Glycol (SYSTANE OP) Place 1 drop into both eyes daily as needed (dry eyes).    [provider]  tamsulosin (FLOMAX) 0.4 MG CAPS capsule Take 0.4 mg by mouth at bedtime.     [provider]  tobramycin (TOBREX) 0.3 % ophthalmic solution Place 1 drop into the left eye See admin instructions. Instill 1 drop into left eye 1 day before, the day of, and the day after eye injections administered at Dr's office    [provider]  traMADol (ULTRAM) 50 MG tablet Take 1 tablet (50 mg total) by mouth every 6 (six) hours as needed for moderate pain. 08/29/18   Isaiah Serge, NP  warfarin (COUMADIN) 5 MG tablet Take 5 mg by mouth daily.    [provider]    Family History Family History  Problem Relation Age of Onset  . Hypertension Mother   . Heart attack Mother   . Hypertension Father     Social History Social History   Tobacco Use  . Smoking status: Former Smoker    Types: Cigarettes  .  Smokeless tobacco: Never Used  . Tobacco comment: quit 57 years ago  Substance Use Topics  . Alcohol use: No  . Drug use: No     Allergies   Lopid [gemfibrozil], Monopril [fosinopril], Vicodin [hydrocodone-acetaminophen], Phenergan [promethazine hcl], Zocor [simvastatin], Benicar [olmesartan], Codeine, and Prilosec [omeprazole]   Review of Systems Review of Systems  All other systems reviewed and are negative.    Physical Exam Updated Vital Signs BP (!) 153/66   Pulse 84   Temp 98.9 F (37.2 C) (Oral)   Resp (!) 22   Ht 5\' 10"  (1.778 m)   Wt 77 kg   SpO2 100%   BMI 24.36 kg/m   Physical Exam Vitals signs and nursing note reviewed. Exam conducted with a chaperone present.  Constitutional:      Appearance: Normal appearance.  HENT:     Head: Normocephalic and atraumatic.  Eyes:     General: No scleral icterus.    Conjunctiva/sclera: Conjunctivae normal.  Pulmonary:     Effort: Pulmonary effort is normal.  Musculoskeletal:     Comments: Left foot: No significant  swelling, erythema, or discoloration appreciated.  Pedal pulse and cap refill intact.  No obvious deformity.  No calf tenderness.  Range of motion and strength intact.  Pain worse with plantar flexion and his heel is tender to palpation.  Skin:    General: Skin is dry.  Neurological:     Mental Status: He is alert.     GCS: GCS eye subscore is 4. GCS verbal subscore is 5. GCS motor subscore is 6.  Psychiatric:        Mood and Affect: Mood normal.        Behavior: Behavior normal.        Thought Content: Thought content normal.      ED Treatments / Results  Labs (all labs ordered are listed, but only abnormal results are displayed) Labs Reviewed  PROTIME-INR - Abnormal; Notable for the following components:      Result Value   Prothrombin Time 25.2 (*)    INR 2.3 (*)    All other components within normal limits  CBC WITH DIFFERENTIAL/PLATELET - Abnormal; Notable for the following components:    RBC 3.77 (*)    MCV 103.4 (*)    MCH 34.7 (*)    All other components within normal limits  BASIC METABOLIC PANEL - Abnormal; Notable for the following components:   CO2 21 (*)    Glucose, Bld 128 (*)    GFR calc non Af Amer 58 (*)    All other components within normal limits    EKG None  Radiology Dg Foot Complete Left  Result Date: 05/30/2019 CLINICAL DATA:  Left foot/heel pain today. No known injury. EXAM: LEFT FOOT - COMPLETE 3+ VIEW COMPARISON:  None. FINDINGS: There is no evidence of fracture or dislocation. Tiny plantar calcaneal spur. Possible remote fractures of the fourth and fifth proximal phalanges. There is no evidence of arthropathy or other focal bone abnormality. Soft tissues are unremarkable. IMPRESSION: Tiny plantar calcaneal spur. No acute abnormality. Electronically Signed   By: Keith Rake M.D.   On: 05/30/2019 23:45    Procedures Procedures (including critical care time)  Medications Ordered in ED Medications - No data to display   Initial Impression / Assessment and Plan / ED Course  I have reviewed the triage vital signs and the nursing notes.  Pertinent labs & imaging results that were available during my care of the patient were reviewed by me and considered in my medical decision making (see chart for details).       X-rays of the foot reveal a small plantar calcaneal spur which could possibly explain his calcaneal discomfort, particularly with ambulation and palpation.  He also endorses worsening discomfort with plantar flexion which could suggest that tendon involvement.  Patient has extensive cardiac history which puts him at risk for stroke.  There was uncertainty as to whether or not he had lost motor function of his left foot at onset of his discomfort, but he denies loss of range of motion or strength on my evaluation.  While he is anticoagulated, obtained PT/INR to assess whether he is at therapeutic level.  His abrupt onset discomfort is  concerning for an arterial occlusion, but his pedal pulses and cap refill is intact.  Given the atraumatic nature and recent surgical ablation, was also concern for a DVT, but no swelling, erythema, or other evidence concerning for a venous thrombosis on physical exam.  Lastly there is no evidence on physical exam or imaging to suggest an infected joint.  Range of motion and strength fully intact.   Will discharge patient with return precautions discussed.  Instructed to follow-up with his PCP regarding his newly discovered bone spurs.  Do not suspect more concerning pathologic process.  Patient is feeling improved after his ED stay and feels as though he now can bear more weight.  Tylenol as needed for pain.  Patient is understanding agreeable to plan.   Final Clinical Impressions(s) / ED Diagnoses   Final diagnoses:  Foot pain, left    ED Discharge Orders    None       Reita Chard 05/31/19 [id number]    Fatima Blank, MD 05/31/19 1640

## 2019-05-31 NOTE — Discharge Instructions (Addendum)
Please follow-up with your PCP regarding this evening's visit.  Return to ER if you develop any left leg swelling, numbness, diminished range of motion, diminished strength, or other new or worsening symptoms. Tylenol as needed for pain.

## 2019-06-01 DIAGNOSIS — M79672 Pain in left foot: Secondary | ICD-10-CM | POA: Diagnosis not present

## 2019-06-01 DIAGNOSIS — I4891 Unspecified atrial fibrillation: Secondary | ICD-10-CM | POA: Diagnosis not present

## 2019-06-01 DIAGNOSIS — Z7901 Long term (current) use of anticoagulants: Secondary | ICD-10-CM | POA: Diagnosis not present

## 2019-06-10 DIAGNOSIS — I4891 Unspecified atrial fibrillation: Secondary | ICD-10-CM | POA: Diagnosis not present

## 2019-06-10 DIAGNOSIS — M79672 Pain in left foot: Secondary | ICD-10-CM | POA: Diagnosis not present

## 2019-06-10 DIAGNOSIS — H6190 Disorder of external ear, unspecified, unspecified ear: Secondary | ICD-10-CM | POA: Diagnosis not present

## 2019-06-10 DIAGNOSIS — Z7901 Long term (current) use of anticoagulants: Secondary | ICD-10-CM | POA: Diagnosis not present

## 2019-06-16 NOTE — Progress Notes (Signed)
Cardiology Office Note:    Date:  06/17/2019   ID:  Jason Santana, DOB [birth year], MRN [id number]  PCP:  [name], MD  Cardiologist:  Sinclair Grooms, MD   Referring MD: [name], MD   Chief Complaint  Patient presents with  . Coronary Artery Disease  . Atrial Fibrillation  . Cardiac Valve Problem    History of Present Illness:    Jason Santana is a 85 y.o. male with a hx of mechanical aortic valve replacement (2000),paroxysmalatrial fibrillationand atrial flutter, chronic anticoagulation therapy, and coronary artery disease with remote CABG.Had lower GI bleed May 2019 required pausing of anticoagulation therapy.Electrical cardioversion performed June 2019, August 15, 2018, and October 22, 2018.   A. fib/flutter ablation September 2020 [facility]).   He is doing well.  He has had no recurrence of atrial flutter/fibrillation since ablation by Dr. Curt Bears.  He denies angina.  There is been no recurrence of volume overload without the presence of an atrial arrhythmia.  He can lie flat in bed.  He has had no blood in his urine and stool.  Overall he feels much better.  D ablation was performed in September 2020  Past Medical History:  Diagnosis Date  . Anticoagulation adequate 05/23/2019  . Atrial fibrillation ([place])   . CAD (coronary artery disease) of artery bypass graft 2000   2000 with multiple bypasses   . Chronic anticoagulation    Coumadin therapy for mechanical aortic valve. Postoperative atrial fibrillation.   . Essential hypertension   . H/O mechanical aortic valve replacement 2000   St Jude AVR  . Hypothyroidism   . Premature ventricular contractions   . S/P ablation of atrial fibrillation 05/22/19 05/23/2019  . Typical atrial flutter ([place]) 05/23/2019    Past Surgical History:  Procedure Laterality Date  . AORTIC VALVE REPLACEMENT (AVR)/CORONARY ARTERY BYPASS GRAFTING (CABG)  2000  . ATRIAL FIBRILLATION ABLATION N/A 05/22/2019   Procedure: ATRIAL  FIBRILLATION ABLATION;  Surgeon: Constance Haw, MD;  Location: [facility] CV LAB;  Service: Cardiovascular;  Laterality: N/A;  . CARDIOVERSION N/A 02/24/2018   Procedure: CARDIOVERSION;  Surgeon: Fay Records, MD;  Location: [facility] ENDOSCOPY;  Service: Cardiovascular;  Laterality: N/A;  . CARDIOVERSION N/A 08/15/2018   Procedure: CARDIOVERSION;  Surgeon: Thayer Headings, MD;  Location: [facility];  Service: Cardiovascular;  Laterality: N/A;  . COLONOSCOPY WITH PROPOFOL N/A 01/08/2018   Procedure: COLONOSCOPY WITH PROPOFOL;  Surgeon: Arta Silence, MD;  Location: [facility] ENDOSCOPY;  Service: Gastroenterology;  Laterality: N/A;  . CORONARY/GRAFT ANGIOGRAPHY N/A 08/14/2018   Procedure: CORONARY/GRAFT ANGIOGRAPHY;  Surgeon: [name], MD;  Location: [place] CV LAB;  Service: Cardiovascular;  Laterality: N/A;  . ESOPHAGOGASTRODUODENOSCOPY (EGD) WITH PROPOFOL N/A 01/06/2018   Procedure: ESOPHAGOGASTRODUODENOSCOPY (EGD) WITH PROPOFOL;  Surgeon: Otis Brace, MD;  Location: [facility] ENDOSCOPY;  Service: Gastroenterology;  Laterality: N/A;    Current Medications: Current Meds  Medication Sig  . Cholecalciferol (VITAMIN D) 2000 units tablet Take 2,000 Units by mouth daily.  . ferrous sulfate 325 (65 FE) MG tablet Take 325 mg by mouth daily with supper.  . levothyroxine (SYNTHROID) 125 MCG tablet Take 125 mcg by mouth daily before breakfast.   . Multiple Vitamins-Minerals (PRESERVISION AREDS 2 PO) Take 1 capsule by mouth 2 (two) times daily.  . pantoprazole (PROTONIX) 40 MG tablet Take 40 mg by mouth 4 (four) times a week.  [name] Glycol-Propyl Glycol (SYSTANE OP) Place 1 drop into both eyes daily as needed (dry eyes).  [name]  tobramycin (TOBREX) 0.3 % ophthalmic solution Place 1 drop into the left eye See admin instructions. Instill 1 drop into left eye 1 day before, the day of, and the day after eye injections administered at Dr's office  . warfarin (COUMADIN) 5 MG tablet Take 5 mg by mouth  daily.  . [DISCONTINUED] amLODipine (NORVASC) 2.5 MG tablet Take 2.5 mg by mouth daily.      Allergies:   Lopid [gemfibrozil], Monopril [fosinopril], Vicodin [hydrocodone-acetaminophen], Phenergan [promethazine hcl], Zocor [simvastatin], Benicar [olmesartan], Codeine, and Prilosec [omeprazole]   Social History   Socioeconomic History  . Marital status: Married    Spouse name: Not on file  . Number of children: Not on file  . Years of education: Not on file  . Highest education level: Not on file  Occupational History  . Not on file  Social Needs  . Financial resource strain: Not on file  . Food insecurity    Worry: Not on file    Inability: Not on file  . Transportation needs    Medical: Not on file    Non-medical: Not on file  Tobacco Use  . Smoking status: Former Smoker    Types: Cigarettes  . Smokeless tobacco: Never Used  . Tobacco comment: quit 57 years ago  Substance and Sexual Activity  . Alcohol use: No  . Drug use: No  . Sexual activity: Not on file  Lifestyle  . Physical activity    Days per week: Not on file    Minutes per session: Not on file  . Stress: Not on file  Relationships  . Social Herbalist on phone: Not on file    Gets together: Not on file    Attends religious service: Not on file    Active member of club or organization: Not on file    Attends meetings of clubs or organizations: Not on file    Relationship status: Not on file  Other Topics Concern  . Not on file  Social History Narrative  . Not on file     Family History: The patient's family history includes Heart attack in his mother; Hypertension in his father and mother.  ROS:   Please see the history of present illness.    Denies chills, fever, blood in urine or stool.  No lower extremity swelling is been noted.  Appetite is good.  He has not had chills or fever.  Occasional numbness in his right foot.  He had an emergency room visit for right foot numbness and pain.   Nothing was identified from a diagnostic standpoint.  All other systems reviewed and are negative.  EKGs/Labs/Other Studies Reviewed:    The following studies were reviewed today: No new data  EKG:  EKG EKG was not repeated.  The most recent tracing performed May 23, 2019 demonstrates normal sinus rhythm, prominent voltage, normal PR interval, and interventricular conduction delay.1  Recent Labs: 10/22/2018: B Natriuretic Peptide 242.5 04/10/2019: Magnesium 1.9 04/15/2019: TSH 4.360 04/28/2019: ALT 62 05/31/2019: BUN 12; Creatinine, Ser 1.15; Hemoglobin 13.1; Platelets 192; Potassium 4.0; Sodium 136  Recent Lipid Panel    Component Value Date/Time   CHOL 130 04/15/2019 0905   TRIG 157 (H) 04/15/2019 0905   HDL 30 (L) 04/15/2019 0905   CHOLHDL 4.3 04/15/2019 0905   CHOLHDL 7.7 08/12/2018 0225   VLDL 48 (H) 08/12/2018 0225   LDLCALC 69 04/15/2019 0905    Physical Exam:    VS:  BP (!) 162/62  Pulse 76   Ht 5\' 10"  (1.778 m)   Wt 176 lb 4 oz (79.9 kg)   SpO2 98%   BMI 25.29 kg/m     Wt Readings from Last 3 Encounters:  06/17/19 176 lb 4 oz (79.9 kg)  05/30/19 169 lb 12.1 oz (77 kg)  05/23/19 172 lb 4.8 oz (78.2 kg)     GEN: Nonobese. No acute distress HEENT: Normal NECK: No JVD. LYMPHATICS: No lymphadenopathy CARDIAC: 2/6 systolic right upper sternal border murmur.  No diastolic murmurs heard.  Mechanical valve closure sounds are heard at the right upper sternal border and throughout the precordium.  RRR without , gallop, or edema. VASCULAR:  Normal Pulses. No bruits. RESPIRATORY:  Clear to auscultation without rales, wheezing or rhonchi  ABDOMEN: Soft, non-tender, non-distended, No pulsatile mass, MUSCULOSKELETAL: No deformity  SKIN: Warm and dry NEUROLOGIC:  Alert and oriented x 3 PSYCHIATRIC:  Normal affect   ASSESSMENT:    1. Coronary artery disease of bypass graft of native heart with stable angina pectoris ([place])   2. H/O mechanical aortic valve replacement    3. Paroxysmal atrial fibrillation ([facility])   4. Renal insufficiency   5. Essential hypertension   6. Educated about COVID-19 virus infection    PLAN:    In order of problems listed above:  1. Secondary prevention discussed. 2. Auscultation of the mechanical valve is normal.  No regurgitation is heard.  Soft systolic murmur is heard. 3. Resolved post ablation by Dr. Curt Bears 4. Most recent creatinine is 1.05 on 05/01/2019 5. Blood pressure is elevated with systolic between 0000000 and 0000000 on several recordings today.  Increase amlodipine to 5 mg/day. 6. The 3W's are discussed and encouraged.  Overall education and awareness concerning primary/secondary risk prevention was discussed in detail: LDL less than 70, hemoglobin A1c less than 7, blood pressure target less than 130/80 mmHg, >150 minutes of moderate aerobic activity per week, avoidance of smoking, weight control (via diet and exercise), and continued surveillance/management of/for obstructive sleep apnea.  Plan: Clinical follow-up in 8 to 12 months.  Aerobic activity is encouraged.  Medication Adjustments/Labs and Tests Ordered: Current medicines are reviewed at length with the patient today.  Concerns regarding medicines are outlined above.  No orders of the defined types were placed in this encounter.  Meds ordered this encounter  Medications  . amLODipine (NORVASC) 5 MG tablet    Sig: Take 1 tablet (5 mg total) by mouth daily.    Dispense:  90 tablet    Refill:  3    Dose change    Patient Instructions  Medication Instructions:  1) INCREASE Amlodipine to 5mg  once daily  If you need a refill on your cardiac medications before your next appointment, please call your pharmacy.   Lab work: None If you have labs (blood work) drawn today and your tests are completely normal, you will receive your results only by: [name] MyChart Message (if you have MyChart) OR . A paper copy in the mail If you have any lab test that is abnormal or  we need to change your treatment, we will call you to review the results.  Testing/Procedures: None  Follow-Up:  Your physician recommends that you schedule a follow-up appointment in: 2-3 weeks with a PA, NP or Hypertension Clinic.   At [facility], you and your health needs are our priority.  As part of our continuing mission to provide you with exceptional heart care, we have created designated Provider Care Teams.  These Care Teams include your primary Cardiologist (physician) and Advanced Practice Providers (APPs -  Physician Assistants and Nurse Practitioners) who all work together to provide you with the care you need, when you need it. You will need a follow up appointment in 12 months.  Please call our office 2 months in advance to schedule this appointment.  You may see Sinclair Grooms, MD or one of the following Advanced Practice Providers on your designated Care Team:   Truitt Merle, NP Cecilie Kicks, NP . Kathyrn Drown, NP  Any Other Special Instructions Will Be Listed Below (If Applicable).       Signed, Sinclair Grooms, MD  06/17/2019 5:09 PM    [facility]

## 2019-06-17 ENCOUNTER — Ambulatory Visit (INDEPENDENT_AMBULATORY_CARE_PROVIDER_SITE_OTHER): Payer: Medicare Other | Admitting: Interventional Cardiology

## 2019-06-17 ENCOUNTER — Encounter: Payer: Self-pay | Admitting: Interventional Cardiology

## 2019-06-17 ENCOUNTER — Other Ambulatory Visit: Payer: Self-pay

## 2019-06-17 VITALS — BP 162/62 | HR 76 | Ht 70.0 in | Wt 176.2 lb

## 2019-06-17 DIAGNOSIS — N289 Disorder of kidney and ureter, unspecified: Secondary | ICD-10-CM | POA: Diagnosis not present

## 2019-06-17 DIAGNOSIS — Z7189 Other specified counseling: Secondary | ICD-10-CM | POA: Diagnosis not present

## 2019-06-17 DIAGNOSIS — I10 Essential (primary) hypertension: Secondary | ICD-10-CM | POA: Diagnosis not present

## 2019-06-17 DIAGNOSIS — Z952 Presence of prosthetic heart valve: Secondary | ICD-10-CM | POA: Diagnosis not present

## 2019-06-17 DIAGNOSIS — I25708 Atherosclerosis of coronary artery bypass graft(s), unspecified, with other forms of angina pectoris: Secondary | ICD-10-CM | POA: Diagnosis not present

## 2019-06-17 DIAGNOSIS — I480 Paroxysmal atrial fibrillation: Secondary | ICD-10-CM | POA: Diagnosis not present

## 2019-06-17 MED ORDER — AMLODIPINE BESYLATE 5 MG PO TABS: 5.0000 mg | ORAL_TABLET | Freq: Every day | ORAL | 3 refills | Status: DC

## 2019-06-17 NOTE — Patient Instructions (Signed)
Medication Instructions:  1) INCREASE Amlodipine to 5mg  once daily  If you need a refill on your cardiac medications before your next appointment, please call your pharmacy.   Lab work: None If you have labs (blood work) drawn today and your tests are completely normal, you will receive your results only by: [name] MyChart Message (if you have MyChart) OR . A paper copy in the mail If you have any lab test that is abnormal or we need to change your treatment, we will call you to review the results.  Testing/Procedures: None  Follow-Up:  Your physician recommends that you schedule a follow-up appointment in: 2-3 weeks with a PA, NP or Hypertension Clinic.   At [facility], you and your health needs are our priority.  As part of our continuing mission to provide you with exceptional heart care, we have created designated Provider Care Teams.  These Care Teams include your primary Cardiologist (physician) and Advanced Practice Providers (APPs -  Physician Assistants and Nurse Practitioners) who all work together to provide you with the care you need, when you need it. You will need a follow up appointment in 12 months.  Please call our office 2 months in advance to schedule this appointment.  You may see Sinclair Grooms, MD or one of the following Advanced Practice Providers on your designated Care Team:   Truitt Merle, NP Cecilie Kicks, NP . Kathyrn Drown, NP  Any Other Special Instructions Will Be Listed Below (If Applicable).

## 2019-06-19 ENCOUNTER — Ambulatory Visit (HOSPITAL_COMMUNITY)
Admission: RE | Admit: 2019-06-19 | Discharge: 2019-06-19 | Disposition: A | Discharge status: Home / Self Care | Payer: Medicare Other | Source: Ambulatory Visit | Attending: Physician Assistant | Admitting: Physician Assistant

## 2019-06-19 ENCOUNTER — Other Ambulatory Visit: Payer: Self-pay

## 2019-06-19 VITALS — BP 178/60 | HR 77 | Ht 70.0 in | Wt 174.6 lb

## 2019-06-19 DIAGNOSIS — Z87891 Personal history of nicotine dependence: Secondary | ICD-10-CM | POA: Diagnosis not present

## 2019-06-19 DIAGNOSIS — Z8249 Family history of ischemic heart disease and other diseases of the circulatory system: Secondary | ICD-10-CM | POA: Diagnosis not present

## 2019-06-19 DIAGNOSIS — I2510 Atherosclerotic heart disease of native coronary artery without angina pectoris: Secondary | ICD-10-CM | POA: Diagnosis not present

## 2019-06-19 DIAGNOSIS — Z951 Presence of aortocoronary bypass graft: Secondary | ICD-10-CM | POA: Insufficient documentation

## 2019-06-19 DIAGNOSIS — R9431 Abnormal electrocardiogram [ECG] [EKG]: Secondary | ICD-10-CM | POA: Diagnosis not present

## 2019-06-19 DIAGNOSIS — Z7901 Long term (current) use of anticoagulants: Secondary | ICD-10-CM | POA: Diagnosis not present

## 2019-06-19 DIAGNOSIS — E039 Hypothyroidism, unspecified: Secondary | ICD-10-CM | POA: Diagnosis not present

## 2019-06-19 DIAGNOSIS — Z79899 Other long term (current) drug therapy: Secondary | ICD-10-CM | POA: Insufficient documentation

## 2019-06-19 DIAGNOSIS — I4892 Unspecified atrial flutter: Secondary | ICD-10-CM | POA: Diagnosis not present

## 2019-06-19 DIAGNOSIS — I4819 Other persistent atrial fibrillation: Secondary | ICD-10-CM | POA: Diagnosis not present

## 2019-06-19 DIAGNOSIS — I10 Essential (primary) hypertension: Secondary | ICD-10-CM | POA: Insufficient documentation

## 2019-06-19 NOTE — Progress Notes (Signed)
Primary Care Physician: [name], MD Primary Cardiologist: Dr Tamala Julian Primary Electrophysiologist: Dr Curt Bears Referring Physician: [facility]   Jason Santana is a 85 y.o. male with a history of CAD, HTN, mechanical AV (2000), persistent atrial fibrillation, and atrial flutter who presents for follow up in the [facility]. He is s/p afib and flutter ablation with Dr Curt Bears on 05/22/19. He reports that he has done very well since the ablation with no heart racing or palpitations. He is on bactrim for a skin infection on his ear. He denies CP, swallowing, or groin issues. His BP has been running higher recently and his amlodipine was recently increased by Dr Tamala Julian.   Today, he denies symptoms of palpitations, chest pain, shortness of breath, orthopnea, PND, lower extremity edema, dizziness, presyncope, syncope, snoring, daytime somnolence, bleeding, or neurologic sequela. The patient is tolerating medications without difficulties and is otherwise without complaint today.    Atrial Fibrillation Risk Factors:  he does not have symptoms or diagnosis of sleep apnea.  he has a BMI of Body mass index is 25.05 kg/m.[name] Filed Weights   06/19/19 0940  Weight: 79.2 kg    Family History  Problem Relation Age of Onset  . Hypertension Mother   . Heart attack Mother   . Hypertension Father      Atrial Fibrillation Management history:  Previous antiarrhythmic drugs: amiodarone (stopped 2/2 elevated LFTs and TSH) Previous cardioversions: 2019 x2 Previous ablations: 05/22/19 fib and flutter CHADS2VASC score: 4 Anticoagulation history: warfarin   Past Medical History:  Diagnosis Date  . Anticoagulation adequate 05/23/2019  . Atrial fibrillation ([place])   . CAD (coronary artery disease) of artery bypass graft 2000   2000 with multiple bypasses   . Chronic anticoagulation    Coumadin therapy for mechanical aortic valve. Postoperative atrial fibrillation.   .  Essential hypertension   . H/O mechanical aortic valve replacement 2000   St Jude AVR  . Hypothyroidism   . Premature ventricular contractions   . S/P ablation of atrial fibrillation 05/22/19 05/23/2019  . Typical atrial flutter ([place]) 05/23/2019   Past Surgical History:  Procedure Laterality Date  . AORTIC VALVE REPLACEMENT (AVR)/CORONARY ARTERY BYPASS GRAFTING (CABG)  2000  . ATRIAL FIBRILLATION ABLATION N/A 05/22/2019   Procedure: ATRIAL FIBRILLATION ABLATION;  Surgeon: Constance Haw, MD;  Location: [place] [facility];  Service: Cardiovascular;  Laterality: N/A;  . CARDIOVERSION N/A 02/24/2018   Procedure: CARDIOVERSION;  Surgeon: Fay Records, MD;  Location: [facility] ENDOSCOPY;  Service: Cardiovascular;  Laterality: N/A;  . CARDIOVERSION N/A 08/15/2018   Procedure: CARDIOVERSION;  Surgeon: Thayer Headings, MD;  Location: [place];  Service: Cardiovascular;  Laterality: N/A;  . COLONOSCOPY WITH PROPOFOL N/A 01/08/2018   Procedure: COLONOSCOPY WITH PROPOFOL;  Surgeon: Arta Silence, MD;  Location: [facility] ENDOSCOPY;  Service: Gastroenterology;  Laterality: N/A;  . CORONARY/GRAFT ANGIOGRAPHY N/A 08/14/2018   Procedure: CORONARY/GRAFT ANGIOGRAPHY;  Surgeon: [name], MD;  Location: [place] [facility];  Service: Cardiovascular;  Laterality: N/A;  . ESOPHAGOGASTRODUODENOSCOPY (EGD) WITH PROPOFOL N/A 01/06/2018   Procedure: ESOPHAGOGASTRODUODENOSCOPY (EGD) WITH PROPOFOL;  Surgeon: Otis Brace, MD;  Location: [facility] ENDOSCOPY;  Service: Gastroenterology;  Laterality: N/A;    Current Outpatient Medications  Medication Sig Dispense Refill  . amLODipine (NORVASC) 5 MG tablet Take 1 tablet (5 mg total) by mouth daily. 90 tablet 3  . Cholecalciferol (VITAMIN D) 2000 units tablet Take 2,000 Units by mouth daily.    . diphenhydramine-acetaminophen (  TYLENOL PM) 25-500 MG TABS tablet Take 0.5 tablets by mouth at bedtime as needed (sleep).    . ferrous sulfate 325 (65 FE) MG tablet Take 325 mg by  mouth daily with supper.    . levothyroxine (SYNTHROID) 125 MCG tablet Take 125 mcg by mouth daily before breakfast.     . Multiple Vitamins-Minerals (PRESERVISION AREDS 2 PO) Take 1 capsule by mouth 2 (two) times daily.    . pantoprazole (PROTONIX) 40 MG tablet Take 40 mg by mouth 4 (four) times a week.    [name] Glycol-Propyl Glycol (SYSTANE OP) Place 1 drop into both eyes daily as needed (dry eyes).    [name] sulfamethoxazole-trimethoprim (BACTRIM DS) 800-160 MG tablet     . tobramycin (TOBREX) 0.3 % ophthalmic solution Place 1 drop into the left eye See admin instructions. Instill 1 drop into left eye 1 day before, the day of, and the day after eye injections administered at Dr's office    . warfarin (COUMADIN) 5 MG tablet Take 5 mg by mouth daily. Taking 7.5mg  on Wednesday and Sunday     No current facility-administered medications for this encounter.     Allergies  Allergen Reactions  . Lopid [Gemfibrozil] Other (See Comments)    transaminitis  . Monopril [Fosinopril] Other (See Comments)    transaminitis  . Vicodin [Hydrocodone-Acetaminophen]     Severe sensitivity  . Phenergan [Promethazine Hcl] Other (See Comments)    Severe somnolence.  . Zocor [Simvastatin]     Short memory loss.  [name] [Olmesartan]     Abdominal cramping and increased stools/ irritable bowel  . Codeine Nausea And Vomiting  . Prilosec [Omeprazole]     dyspepsia    Social History   Socioeconomic History  . Marital status: Married    Spouse name: Not on file  . Number of children: Not on file  . Years of education: Not on file  . Highest education level: Not on file  Occupational History  . Not on file  Social Needs  . Financial resource strain: Not on file  . Food insecurity    Worry: Not on file    Inability: Not on file  . Transportation needs    Medical: Not on file    Non-medical: Not on file  Tobacco Use  . Smoking status: Former Smoker    Types: Cigarettes  . Smokeless tobacco:  Never Used  . Tobacco comment: quit 57 years ago  Substance and Sexual Activity  . Alcohol use: No  . Drug use: No  . Sexual activity: Not on file  Lifestyle  . Physical activity    Days per week: Not on file    Minutes per session: Not on file  . Stress: Not on file  Relationships  . Social Herbalist on phone: Not on file    Gets together: Not on file    Attends religious service: Not on file    Active member of club or organization: Not on file    Attends meetings of clubs or organizations: Not on file    Relationship status: Not on file  . Intimate partner violence    Fear of current or ex partner: Not on file    Emotionally abused: Not on file    Physically abused: Not on file    Forced sexual activity: Not on file  Other Topics Concern  . Not on file  Social History Narrative  . Not on file  ROS- All systems are reviewed and negative except as per the HPI above.  Physical Exam: Vitals:   06/19/19 0940  BP: (!) 178/60  Pulse: 77  Weight: 79.2 kg  Height: 5\' 10"  (1.778 m)    GEN- The patient is well appearing elderly male, alert and oriented x 3 today.   Head- normocephalic, atraumatic Eyes-  Sclera clear, conjunctiva pink Ears- hearing intact Oropharynx- clear Neck- supple  Lungs- Clear to ausculation bilaterally, normal work of breathing Heart- Regular rate and rhythm, no, rubs or gallops. 2/6 systolic murmur, mech S2 GI- soft, NT, ND, + BS Extremities- no clubbing, cyanosis, or edema MS- no significant deformity or atrophy Skin- no rash or lesion Psych- euthymic mood, full affect Neuro- strength and sensation are intact  Wt Readings from Last 3 Encounters:  06/19/19 79.2 kg  06/17/19 79.9 kg  05/30/19 77 kg    EKG today demonstrates SR HR 77, LAFB, slow R wave prog, PR 136, QRS 124, QTc 502  Echo 08/12/18 demonstrated  - Left ventricle: The cavity size was normal. There was mild   concentric hypertrophy. Systolic function was  normal. The   estimated ejection fraction was in the range of 55% to 60%. Wall   motion was normal; there were no regional wall motion   abnormalities. - Ventricular septum: Septal motion showed paradox. These changes   are consistent with a post-thoracotomy state. - Aortic valve: A mechanical prosthesis was present and functioning   normally. - Mitral valve: Calcified annulus. There was mild regurgitation. - Left atrium: The atrium was mildly dilated. - Right ventricle: Systolic function was mildly to moderately   reduced. - Right atrium: The atrium was mildly dilated.  Epic records are reviewed at length today  Assessment and Plan:  1. Persistent atrial fibrillation/atrial flutter S/p afib and flutter ablation with Dr Curt Bears 05/22/19. Patient appears to be maintaining SR. Off AAD. Continue warfarin  This patients CHA2DS2-VASc Score and unadjusted Ischemic Stroke Rate (% per year) is equal to 4.8 % stroke rate/year from a score of 4  Above score calculated as 1 point each if present [CHF, HTN, DM, Vascular=MI/PAD/Aortic Plaque, Age if 65-74, or Male] Above score calculated as 2 points each if present [Age > 75, or Stroke/TIA/TE]   2. CAD S/p remote CABG No anginal symptoms. Continue present therapy and risk factor modification.   3. HTN Elevated today, better on recheck. Norvasc increased by Dr Tamala Julian two days ago. Pt has appt in HTN clinic.   Follow up with Dr Curt Bears as scheduled.    [facility] [address] [place], [place] [address] [phone] 06/19/2019 10:05 AM

## 2019-06-24 DIAGNOSIS — H353222 Exudative age-related macular degeneration, left eye, with inactive choroidal neovascularization: Secondary | ICD-10-CM | POA: Diagnosis not present

## 2019-06-24 DIAGNOSIS — H43813 Vitreous degeneration, bilateral: Secondary | ICD-10-CM | POA: Diagnosis not present

## 2019-06-24 DIAGNOSIS — H353111 Nonexudative age-related macular degeneration, right eye, early dry stage: Secondary | ICD-10-CM | POA: Diagnosis not present

## 2019-06-26 DIAGNOSIS — C44629 Squamous cell carcinoma of skin of left upper limb, including shoulder: Secondary | ICD-10-CM | POA: Diagnosis not present

## 2019-06-26 DIAGNOSIS — C44229 Squamous cell carcinoma of skin of left ear and external auricular canal: Secondary | ICD-10-CM | POA: Diagnosis not present

## 2019-06-26 DIAGNOSIS — Z85828 Personal history of other malignant neoplasm of skin: Secondary | ICD-10-CM | POA: Diagnosis not present

## 2019-07-07 DIAGNOSIS — C44229 Squamous cell carcinoma of skin of left ear and external auricular canal: Secondary | ICD-10-CM | POA: Diagnosis not present

## 2019-07-07 DIAGNOSIS — Z85828 Personal history of other malignant neoplasm of skin: Secondary | ICD-10-CM | POA: Diagnosis not present

## 2019-07-08 ENCOUNTER — Other Ambulatory Visit: Payer: Self-pay

## 2019-07-08 ENCOUNTER — Ambulatory Visit (INDEPENDENT_AMBULATORY_CARE_PROVIDER_SITE_OTHER): Payer: Medicare Other

## 2019-07-08 VITALS — BP 152/68 | HR 83

## 2019-07-08 DIAGNOSIS — I10 Essential (primary) hypertension: Secondary | ICD-10-CM | POA: Diagnosis not present

## 2019-07-08 DIAGNOSIS — I25708 Atherosclerosis of coronary artery bypass graft(s), unspecified, with other forms of angina pectoris: Secondary | ICD-10-CM

## 2019-07-08 DIAGNOSIS — Z7901 Long term (current) use of anticoagulants: Secondary | ICD-10-CM | POA: Diagnosis not present

## 2019-07-08 MED ORDER — AMLODIPINE BESYLATE 10 MG PO TABS: 10.0000 mg | ORAL_TABLET | Freq: Every day | ORAL | 3 refills | Status: DC

## 2019-07-08 NOTE — Progress Notes (Signed)
Patient ID: BRONTE DEWATER                 DOB: 1932-01-16                      MRN: [id number]     HPI: Jason Santana is a 85 y.o. male referred by Dr. Tamala Julian to HTN clinic. PMH is significant for HTN, CAD s/p CABG, afib s/p ablation in September 2020, and mechanical AVR (2000) on warfarin. Pt was last seen on 06/17/19 by Dr. Tamala Julian. BP at this visit was 162/62 (HR 77 bpm) and his amlodipine was increased from 2.5 mg to 5 mg daily. Last BMET on 9/27 with SCr 1.15 (estimated CrCl 50 mL/min) and normal potassium. Last ECHO 08/2018 with LVEF 55-60%.   Patient presents for initial visit to HTN clinic today. He denies shortness of breath, swelling, dizziness, lightheadedness, or chest pain. He has taken his medications already this morning when he got up at 2:30 am. He denies any missed doses of medications. He does not check his BP at home and does not have any interest in starting to check his BP at home.   Current HTN meds: amlodipine 5 mg daily  Previously tried: fosinopril (transaminitis), olmesartan (abdominal cramping/IBS), metoprolol (bradycardia), hydrochlorothiazide (hyponatremia)  BP goal: <130/80  Family History: Heart attack in his mother; Hypertension in his father and mother.   Social History: former smoker (quit 62 years ago), never alcohol   Diet: eats 6 Cheez-its with each meal, chicken salad sandwiches, cereal, oatmeal, raisins in the morning, green beans, potatoes, okra, typically has light suppers Does not add salt to foods since surgery   Drinks water throughout the day, coffee on some mornings but not daily  Exercise: stays very active but had reconstructive surgery on ear yesterday so is instructed to limit activity for the next 2 weeks  Home BP readings: Does not take BP at home - no machine and not interested in taking at home  Wt Readings from Last 3 Encounters:  06/19/19 174 lb 9.6 oz (79.2 kg)  06/17/19 176 lb 4 oz (79.9 kg)  05/30/19 169 lb 12.1 oz (77 kg)   BP  Readings from Last 3 Encounters:  06/19/19 (!) 178/60  06/17/19 (!) 162/62  05/31/19 (!) 145/60   Pulse Readings from Last 3 Encounters:  06/19/19 77  06/17/19 76  05/31/19 80    Renal function: CrCl cannot be calculated (Patient's most recent lab result is older than the maximum 21 days allowed.).  Past Medical History:  Diagnosis Date  . Anticoagulation adequate 05/23/2019  . Atrial fibrillation ([place])   . CAD (coronary artery disease) of artery bypass graft 2000   2000 with multiple bypasses   . Chronic anticoagulation    Coumadin therapy for mechanical aortic valve. Postoperative atrial fibrillation.   . Essential hypertension   . H/O mechanical aortic valve replacement 2000   St Jude AVR  . Hypothyroidism   . Premature ventricular contractions   . S/P ablation of atrial fibrillation 05/22/19 05/23/2019  . Typical atrial flutter ([place]) 05/23/2019    Current Outpatient Medications on File Prior to Visit  Medication Sig Dispense Refill  . amLODipine (NORVASC) 5 MG tablet Take 1 tablet (5 mg total) by mouth daily. 90 tablet 3  . Cholecalciferol (VITAMIN D) 2000 units tablet Take 2,000 Units by mouth daily.    . diphenhydramine-acetaminophen (TYLENOL PM) 25-500 MG TABS tablet Take 0.5 tablets by mouth at bedtime as  needed (sleep).    . ferrous sulfate 325 (65 FE) MG tablet Take 325 mg by mouth daily with supper.    . levothyroxine (SYNTHROID) 125 MCG tablet Take 125 mcg by mouth daily before breakfast.     . Multiple Vitamins-Minerals (PRESERVISION AREDS 2 PO) Take 1 capsule by mouth 2 (two) times daily.    . pantoprazole (PROTONIX) 40 MG tablet Take 40 mg by mouth 4 (four) times a week.    [name] Glycol-Propyl Glycol (SYSTANE OP) Place 1 drop into both eyes daily as needed (dry eyes).    [name] sulfamethoxazole-trimethoprim (BACTRIM DS) 800-160 MG tablet     . tobramycin (TOBREX) 0.3 % ophthalmic solution Place 1 drop into the left eye See admin instructions. Instill 1 drop into  left eye 1 day before, the day of, and the day after eye injections administered at Dr's office    . warfarin (COUMADIN) 5 MG tablet Take 5 mg by mouth daily. Taking 7.5mg  on Wednesday and Sunday     No current facility-administered medications on file prior to visit.     Allergies  Allergen Reactions  . Lopid [Gemfibrozil] Other (See Comments)    transaminitis  . Monopril [Fosinopril] Other (See Comments)    transaminitis  . Vicodin [Hydrocodone-Acetaminophen]     Severe sensitivity  . Phenergan [Promethazine Hcl] Other (See Comments)    Severe somnolence.  . Zocor [Simvastatin]     Short memory loss.  [name] [Olmesartan]     Abdominal cramping and increased stools/ irritable bowel  . Codeine Nausea And Vomiting  . Prilosec [Omeprazole]     dyspepsia    Assessment/Plan:  1. Hypertension - BP is above goal at 152/68 (goal <130/80). Increase amlodipine to 10 mg daily. Counseled on potential side effect such as edema. Pt not ready to discuss exercise habits at this time due to mobility restrictions from recent procedure. Follow up with patient in 1 month for repeat BP check.  Vertis Kelch, PharmD PGY2 Cardiology Pharmacy Resident [place] [id number] [place], [place] [address] Phone: [phone]; Fax: [phone]

## 2019-07-08 NOTE — Patient Instructions (Addendum)
Thank you for seeing [place] today!  Your BP was 152/68. Your goal is <130/80.  Please increase your amlodipine to 10 mg once daily. You can take 2 tablets of the 5 mg strength until you run out. I have sent a prescription for the 10 mg over to the pharmacy when you need a refill.   Your next appointment with [place] is on Wednesday 12/2 at 1:30pm.

## 2019-07-09 ENCOUNTER — Ambulatory Visit: Payer: Medicare Other

## 2019-07-20 DIAGNOSIS — C44629 Squamous cell carcinoma of skin of left upper limb, including shoulder: Secondary | ICD-10-CM | POA: Diagnosis not present

## 2019-07-20 DIAGNOSIS — Z85828 Personal history of other malignant neoplasm of skin: Secondary | ICD-10-CM | POA: Diagnosis not present

## 2019-07-20 DIAGNOSIS — L988 Other specified disorders of the skin and subcutaneous tissue: Secondary | ICD-10-CM | POA: Diagnosis not present

## 2019-07-21 ENCOUNTER — Telehealth: Payer: Self-pay | Admitting: Interventional Cardiology

## 2019-07-21 NOTE — Telephone Encounter (Signed)
New Message  Pt c/o swelling: STAT is pt has developed SOB within 24 hours  1) How much weight have you gained and in what time span? None  2) If swelling, where is the swelling located? Legs, feet, and ankles  3) Are you currently taking a fluid pill? No  4) Are you currently SOB? No  5) Do you have a log of your daily weights (if so, list)? N/A  6) Have you gained 3 pounds in a day or 5 pounds in a week? No  7) Have you traveled recently? No

## 2019-07-21 NOTE — Telephone Encounter (Signed)
Pt called to report that since he has increased his Amlodipine to 10mg  a day he has been noticing worsening bilateral foot and ankle edema. He denies SOB and no change in the NA in his diet.   Pt says since staring the Amlodipine his weight has increased over 10 lbs over the past several months.. he is now 186 lbs.    I have instructed him to elevate his feet when sitting. I will forward to the [facility] Clinic / [facility] for further advice.

## 2019-07-22 MED ORDER — VALSARTAN 80 MG PO TABS: 80.0000 mg | ORAL_TABLET | Freq: Every day | ORAL | 5 refills | Status: DC

## 2019-07-22 MED ORDER — AMLODIPINE BESYLATE 5 MG PO TABS: 5.0000 mg | ORAL_TABLET | Freq: Every day | ORAL | 6 refills | Status: DC

## 2019-07-22 NOTE — Telephone Encounter (Signed)
Follow up    Please return call to patient with medication instructions

## 2019-07-22 NOTE — Telephone Encounter (Signed)
Follow Up:     Pt is calling to find out what was decided.  

## 2019-07-22 NOTE — Telephone Encounter (Signed)
Called pt to follow up but no answer - left voicemail to return call.  Instructions:  - decrease amlodipine back to 5 mg once daily - add valsartan 80 mg once daily - this has been sent over to the pharmacy to pick up (patient has failed olmesartan in the past due to abd discomfort / IBS - will retry a different ARB and monitor to see if he's able to tolerate this)

## 2019-07-22 NOTE — Telephone Encounter (Signed)
Returned pt call. Reviewed instructions with pt. Pt verbalized understanding. Will follow up in HTN clinic on 12/2.

## 2019-08-05 ENCOUNTER — Encounter: Payer: Self-pay | Admitting: Pharmacist

## 2019-08-05 ENCOUNTER — Ambulatory Visit (INDEPENDENT_AMBULATORY_CARE_PROVIDER_SITE_OTHER): Payer: Medicare Other | Admitting: Pharmacist

## 2019-08-05 ENCOUNTER — Other Ambulatory Visit: Payer: Self-pay

## 2019-08-05 VITALS — BP 162/58 | HR 85

## 2019-08-05 DIAGNOSIS — I10 Essential (primary) hypertension: Secondary | ICD-10-CM | POA: Diagnosis not present

## 2019-08-05 DIAGNOSIS — I25708 Atherosclerosis of coronary artery bypass graft(s), unspecified, with other forms of angina pectoris: Secondary | ICD-10-CM

## 2019-08-05 MED ORDER — AMLODIPINE BESYLATE 2.5 MG PO TABS: 2.5000 mg | ORAL_TABLET | Freq: Every day | ORAL | 3 refills | Status: DC

## 2019-08-05 NOTE — Progress Notes (Signed)
Patient ID: Jason Santana                 DOB: 04-04-1932                      MRN: [id number]     HPI: Jason Santana is a 85 y.o. male referred by Dr. Tamala Julian to HTN clinic. PMH is significant for HTN, CAD s/p CABG, afib s/p ablation in September 2020, and mechanical AVR (2000) on warfarin. Pt was last seen on 06/17/19 by Dr. Tamala Julian. BP at this visit was 162/62 (HR 77 bpm) and his amlodipine was increased from 2.5 mg to 5 mg daily. Last BMET on 9/27 with SCr 1.15 (estimated CrCl 50 mL/min) and normal potassium. Last ECHO 08/2018 with LVEF 55-60%.   At last visit patient blood pressure was 152/68. He stated that he does not check his BP at home and does not have any interest in starting to check his BP at home. He was not ready to discuss exercise habits at this time due to mobility restrictions from recent procedure. His amlodipine was initially increased to 10mg  daily, however patient reported feet swelling, therefore amlodipine was decreased back to 5mg  and valsartan 80mg  daily was started.  Patient presents today for follow up visit it the HTN clinic. Patient denies dizziness, lightheadedness, headache, blurred vision, SOB. States his swelling is improved. He however still has numbness in his toes and complaining of flushing. He took his blood pressure medications today at 5:00 AM. Is not checking his blood pressure at home. He needs a BMP due to initiation of valsartan. Will go to lab today.  Current HTN meds: amlodipine 5mg  daily, valsartan 80mg  daily  Previously tried: fosinopril (transaminitis), olmesartan (abdominal cramping/IBS), metoprolol (bradycardia), hydrochlorothiazide (hyponatremia), amlodipine 10mg  (foot swelling)  BP goal: <130/80  Family History: Heart attack in his mother; Hypertension in his father and mother.   Social History: former smoker (quit 62 years ago), never alcohol   Diet: eats 6 Cheez-its with each meal, chicken salad sandwiches, cereal, oatmeal, raisins in the  morning, green beans, potatoes, okra, typically has light suppers Does not add salt to foods since surgery   Drinks water throughout the day, coffee on some mornings but not daily  Exercise: stays very active but had reconstructive surgery on ear yesterday so is instructed to limit activity for the next 2 weeks  Home BP readings: Does not take BP at home - no machine and not interested in taking at home  Wt Readings from Last 3 Encounters:  06/19/19 174 lb 9.6 oz (79.2 kg)  06/17/19 176 lb 4 oz (79.9 kg)  05/30/19 169 lb 12.1 oz (77 kg)   BP Readings from Last 3 Encounters:  07/08/19 (!) 152/68  06/19/19 (!) 178/60  06/17/19 (!) 162/62   Pulse Readings from Last 3 Encounters:  07/08/19 83  06/19/19 77  06/17/19 76    Renal function: CrCl cannot be calculated (Patient's most recent lab result is older than the maximum 21 days allowed.).  Past Medical History:  Diagnosis Date  . Anticoagulation adequate 05/23/2019  . Atrial fibrillation ([place])   . CAD (coronary artery disease) of artery bypass graft 2000   2000 with multiple bypasses   . Chronic anticoagulation    Coumadin therapy for mechanical aortic valve. Postoperative atrial fibrillation.   . Essential hypertension   . H/O mechanical aortic valve replacement 2000   St Jude AVR  . Hypothyroidism   . Premature  ventricular contractions   . S/P ablation of atrial fibrillation 05/22/19 05/23/2019  . Typical atrial flutter ([place]) 05/23/2019    Current Outpatient Medications on File Prior to Visit  Medication Sig Dispense Refill  . amLODipine (NORVASC) 5 MG tablet Take 1 tablet (5 mg total) by mouth daily. 30 tablet 6  . Cholecalciferol (VITAMIN D) 2000 units tablet Take 2,000 Units by mouth daily.    . diphenhydramine-acetaminophen (TYLENOL PM) 25-500 MG TABS tablet Take 0.5 tablets by mouth at bedtime as needed (sleep).    . ferrous sulfate 325 (65 FE) MG tablet Take 325 mg by mouth daily with supper.    . levothyroxine  (SYNTHROID) 125 MCG tablet Take 125 mcg by mouth daily before breakfast.     . Multiple Vitamins-Minerals (PRESERVISION AREDS 2 PO) Take 1 capsule by mouth 2 (two) times daily.    . pantoprazole (PROTONIX) 40 MG tablet Take 40 mg by mouth 4 (four) times a week.    [name] Glycol-Propyl Glycol (SYSTANE OP) Place 1 drop into both eyes daily as needed (dry eyes).    [name] sulfamethoxazole-trimethoprim (BACTRIM DS) 800-160 MG tablet     . tobramycin (TOBREX) 0.3 % ophthalmic solution Place 1 drop into the left eye See admin instructions. Instill 1 drop into left eye 1 day before, the day of, and the day after eye injections administered at Dr's office    . valsartan (DIOVAN) 80 MG tablet Take 1 tablet (80 mg total) by mouth daily. 30 tablet 5  . warfarin (COUMADIN) 5 MG tablet Take 5 mg by mouth daily. Taking 7.5mg  on Wednesday and Sunday     No current facility-administered medications on file prior to visit.     Allergies  Allergen Reactions  . Lopid [Gemfibrozil] Other (See Comments)    transaminitis  . Monopril [Fosinopril] Other (See Comments)    transaminitis  . Vicodin [Hydrocodone-Acetaminophen]     Severe sensitivity  . Phenergan [Promethazine Hcl] Other (See Comments)    Severe somnolence.  . Zocor [Simvastatin]     Short memory loss.  [name] [Olmesartan]     Abdominal cramping and increased stools/ irritable bowel  . Codeine Nausea And Vomiting  . Prilosec [Omeprazole]     dyspepsia    Assessment/Plan:  1. Hypertension -Patient blood pressure is not at goal of <130/80 today in clinic. He does not check blood pressure at home, therefore do not know if it is better controlled there. I have asked the patient to check his blood pressure at home and recommended an OMRON upper arm BP cuff. Explained the importance and usefulness of checking blood pressure at home and patient is agreeable. Will decrease amlodipine to 2.5mg  due to peripheral neuropathy and flushing (both listed  side effects of amlodipine). Will plan to increase valsartan to 160mg  daily as long as scr and K are stable. Patient has appointment with Dr. Tomasa Blase on 12/17. Will let this serve as a blood pressure check. We will then see patient back in HTN clinic on 09/08/2019. I will call patient tomorrow with BMP results and finalize plan. Patient encouraged to resume regular exercise.   Ramond Dial, Pharm.D, BCPS, CPP [place]  [id number] [place], [place] [address]  Phone: [phone]; Fax: [phone]

## 2019-08-05 NOTE — Patient Instructions (Addendum)
It was a pleasure to meet you today!  Please pick up a blood pressure meter and start checking your blood pressure once a day. Please record your readings and bring them with you to your next visit. We recommend an OMRON meter.  Try to start riding your bike or walking more regularly. Ultimate goal is at least 150 min per week  DECREASE your amlodipine to 2.5mg  daily. I have sent in a new prescription for you. Pending your lab work results we will plan to increase your valsartan to 160mg  daily.  Call us in clinic at [phone] with any questions

## 2019-08-06 ENCOUNTER — Telehealth: Payer: Self-pay | Admitting: Pharmacist

## 2019-08-06 LAB — BASIC METABOLIC PANEL
BUN/Creatinine Ratio: 12 (ref 10–24)
BUN: 13 mg/dL (ref 8–27)
CO2: 21 mmol/L (ref 20–29)
Calcium: 9.5 mg/dL (ref 8.6–10.2)
Chloride: 104 mmol/L (ref 96–106)
Creatinine, Ser: 1.12 mg/dL (ref 0.76–1.27)
GFR calc Af Amer: 69 mL/min/{1.73_m2} (ref >59)
GFR calc non Af Amer: 60 mL/min/{1.73_m2} (ref >59)
Glucose: 166 mg/dL — ABNORMAL HIGH (ref 65–99)
Potassium: 4.2 mmol/L (ref 3.5–5.2)
Sodium: 141 mmol/L (ref 134–144)

## 2019-08-06 MED ORDER — VALSARTAN 160 MG PO TABS: 160.0000 mg | ORAL_TABLET | Freq: Every day | ORAL | 3 refills | Status: DC

## 2019-08-06 NOTE — Telephone Encounter (Signed)
Patient returned call. Advised of information below. Patient in agreement. rx for valsartan 160mg  daily sent to pharmacy. Patient advised he can take 2 of the 80mg  tablets until her runs out.

## 2019-08-06 NOTE — Addendum Note (Signed)
Addended by: Marcelle Overlie D on: 08/06/2019 11:30 AM   Modules accepted: Orders

## 2019-08-06 NOTE — Telephone Encounter (Signed)
Scr and K are stable. Will increase valsartan to 160mg  daily as planned. Decrease amlodipine to 2.5mg  daily. If blood pressure still elevated at appointment with Dr. Curt Santana should consider increasing to 320mg  daily. Patient should check blood pressure daily at home as advised.  Called and left VM on patients phone to return call.

## 2019-08-07 DIAGNOSIS — Z7901 Long term (current) use of anticoagulants: Secondary | ICD-10-CM | POA: Diagnosis not present

## 2019-08-20 ENCOUNTER — Ambulatory Visit (INDEPENDENT_AMBULATORY_CARE_PROVIDER_SITE_OTHER): Payer: Medicare Other | Admitting: Cardiology

## 2019-08-20 ENCOUNTER — Encounter: Payer: Self-pay | Admitting: Cardiology

## 2019-08-20 ENCOUNTER — Other Ambulatory Visit: Payer: Self-pay

## 2019-08-20 VITALS — BP 152/66 | HR 83 | Ht 70.0 in | Wt 181.0 lb

## 2019-08-20 DIAGNOSIS — I25708 Atherosclerosis of coronary artery bypass graft(s), unspecified, with other forms of angina pectoris: Secondary | ICD-10-CM

## 2019-08-20 DIAGNOSIS — I4819 Other persistent atrial fibrillation: Secondary | ICD-10-CM | POA: Diagnosis not present

## 2019-08-20 MED ORDER — AMLODIPINE BESYLATE 5 MG PO TABS: 5.0000 mg | ORAL_TABLET | Freq: Every day | ORAL | 1 refills | Status: DC

## 2019-08-20 MED ORDER — AMLODIPINE BESYLATE 10 MG PO TABS: 10.0000 mg | ORAL_TABLET | Freq: Every day | ORAL | 1 refills | Status: DC

## 2019-08-20 MED ORDER — CARVEDILOL 6.25 MG PO TABS: 6.2500 mg | ORAL_TABLET | Freq: Two times a day (BID) | ORAL | 3 refills | Status: DC

## 2019-08-20 NOTE — Addendum Note (Signed)
Addended by: Stanton Kidney on: 08/20/2019 11:00 AM   Modules accepted: Orders

## 2019-08-20 NOTE — Patient Instructions (Addendum)
Medication Instructions:  Your physician has recommended you make the following change in your medication:  1. INCREASE Norvasc (Amlodipine) to 5 mg once daily 2. START Carvedilol 6.25 mg TWICE a day                                                                                                                                                        * If you need a refill on your cardiac medications before your next appointment, please call your pharmacy.   Labwork: None ordered  Testing/Procedures: None ordered  Follow-Up: At [facility], you and your health needs are our priority.  As part of our continuing mission to provide you with exceptional heart care, we have created designated Provider Care Teams.  These Care Teams include your primary Cardiologist (physician) and Advanced Practice Providers (APPs -  Physician Assistants and Nurse Practitioners) who all work together to provide you with the care you need, when you need it.  You will need a follow up appointment in 3 months.  Please call our office 2 months in advance to schedule this appointment.  You may  one of the following Advanced Practice Providers on your designated Care Team:    Chanetta Marshall, NP  Tommye Standard, PA-C  Oda Kilts, [place]  Your physician wants you to follow-up in: 6 months with Dr. Curt Bears.  You will receive a reminder letter in the mail two months in advance. If you don't receive a letter, please call our office to schedule the follow-up appointment.   Thank you for choosing [facility]!!   Trinidad Curet, RN [phone]  Any Other Special Instructions Will Be Listed Below (If Applicable).  Carvedilol tablets What is this medicine? CARVEDILOL (KAR ve dil ol) is a beta-blocker. Beta-blockers reduce the workload on the heart and help it to beat more regularly. This medicine is used to treat high blood pressure and heart failure. This medicine may be used for other purposes; ask your health care  provider or pharmacist if you have questions. COMMON BRAND NAME(S): Coreg What should I tell my health care provider before I take this medicine? They need to know if you have any of these conditions:  circulation problems  diabetes  history of heart attack or heart disease  liver disease  lung or breathing disease, like asthma or emphysema  pheochromocytoma  slow or irregular heartbeat  thyroid disease  an unusual or allergic reaction to carvedilol, other beta-blockers, medicines, foods, dyes, or preservatives  pregnant or trying to get pregnant  breast-feeding How should I use this medicine? Take this medicine by mouth with a glass of water. Follow the directions on the prescription label. It is best to take the tablets with food. Take your doses at regular intervals. Do not take your medicine more often than directed. Do  not stop taking except on the advice of your doctor or health care professional. Talk to your pediatrician regarding the use of this medicine in children. Special care may be needed. Overdosage: If you think you have taken too much of this medicine contact a poison control center or emergency room at once. NOTE: This medicine is only for you. Do not share this medicine with others. What if I miss a dose? If you miss a dose, take it as soon as you can. If it is almost time for your next dose, take only that dose. Do not take double or extra doses. What may interact with this medicine? This medicine may interact with the following medications:  certain medicines for blood pressure, heart disease, irregular heart beat  certain medicines for depression, like fluoxetine or paroxetine  certain medicines for diabetes, like glipizide or glyburide  cimetidine  clonidine  cyclosporine  digoxin  MAOIs like Carbex, Eldepryl, Marplan, Nardil, and Parnate  reserpine  rifampin This list may not describe all possible interactions. Give your health care  provider a list of all the medicines, herbs, non-prescription drugs, or dietary supplements you use. Also tell them if you smoke, drink alcohol, or use illegal drugs. Some items may interact with your medicine. What should I watch for while using this medicine? Check your heart rate and blood pressure regularly while you are taking this medicine. Ask your doctor or health care professional what your heart rate and blood pressure should be, and when you should contact him or her. Do not stop taking this medicine suddenly. This could lead to serious heart-related effects. Contact your doctor or health care professional if you have difficulty breathing while taking this drug. Check your weight daily. Ask your doctor or health care professional when you should notify him/her of any weight gain. You may get drowsy or dizzy. Do not drive, use machinery, or do anything that requires mental alertness until you know how this medicine affects you. To reduce the risk of dizzy or fainting spells, do not sit or stand up quickly. Alcohol can make you more drowsy, and increase flushing and rapid heartbeats. Avoid alcoholic drinks. This medicine may increase blood sugar. Ask your healthcare provider if changes in diet or medicines are needed if you have diabetes. If you are going to have surgery, tell your doctor or health care professional that you are taking this medicine. What side effects may I notice from receiving this medicine? Side effects that you should report to your doctor or health care professional as soon as possible:  allergic reactions like skin rash, itching or hives, swelling of the face, lips, or tongue  breathing problems  dark urine  irregular heartbeat   signs and symptoms of high blood sugar such as being more thirsty or hungry or having to urinate more than normal. You may also feel very tired or have blurry vision.  swollen legs or ankles  vomiting  yellowing of the eyes or  skin Side effects that usually do not require medical attention (report to your doctor or health care professional if they continue or are bothersome):  change in sex drive or performance  diarrhea  dry eyes (especially if wearing contact lenses)  dry, itching skin  headache  nausea  unusually tired This list may not describe all possible side effects. Call your doctor for medical advice about side effects. You may report side effects to FDA at 1-800-FDA-1088. Where should I keep my medicine? Keep out of the  reach of children. Store at room temperature below 30 degrees C (86 degrees F). Protect from moisture. Keep container tightly closed. Throw away any unused medicine after the expiration date. NOTE: This sheet is a summary. It may not cover all possible information. If you have questions about this medicine, talk to your doctor, pharmacist, or health care provider.  2020 Elsevier/Gold Standard (2018-06-11 09:13:57)

## 2019-08-20 NOTE — Progress Notes (Signed)
Electrophysiology Office Note   Date:  08/20/2019   ID:  Jason Santana, DOB 1932/07/04, MRN [id number]  PCP:  [name], MD  Cardiologist:  Tamala Julian Primary Electrophysiologist:  Axten Pascucci Meredith Leeds, MD    No chief complaint on file.    History of Present Illness: Jason Santana is a 85 y.o. male who is being seen today for the evaluation of atrial fibrillation at the request of [name], MD. Presenting today for electrophysiology evaluation.  He has a history of mechanical aortic valve replacement in 2000, atrial fibrillation and atrial flutter, coronary artery disease.  He had a GI bleed May 2019 requiring a pause and anticoagulation.  He has had 3 cardioversions over the past 6 months, most recently October 22, 2018.  He is currently on amiodarone which has been increased to 400 mg daily.  Symptoms with atrial fibrillation are lightheadedness and fatigue.  Amiodarone was started December 2019.  He was in the emergency room 04/10/2019 with palpitations.  He was found to be in atrial fibrillation at the time.  He had a cardioversion into sinus rhythm.  Subsequent labs have shown elevated LFTs and TSH.  He is now status post AF ablation 05/22/2019.  Today, denies symptoms of palpitations, chest pain, shortness of breath, orthopnea, PND, lower extremity edema, claudication, dizziness, presyncope, syncope, bleeding, or neurologic sequela. The patient is tolerating medications without difficulties.  He is overall doing well.  He is noted no further episodes of atrial fibrillation.   Past Medical History:  Diagnosis Date  . Anticoagulation adequate 05/23/2019  . Atrial fibrillation ([place])   . CAD (coronary artery disease) of artery bypass graft 2000   2000 with multiple bypasses   . Chronic anticoagulation    Coumadin therapy for mechanical aortic valve. Postoperative atrial fibrillation.   . Essential hypertension   . H/O mechanical aortic valve replacement 2000   [employer] AVR  .  Hypothyroidism   . Premature ventricular contractions   . S/P ablation of atrial fibrillation 05/22/19 05/23/2019  . Typical atrial flutter ([place]) 05/23/2019   Past Surgical History:  Procedure Laterality Date  . AORTIC VALVE REPLACEMENT (AVR)/CORONARY ARTERY BYPASS GRAFTING (CABG)  2000  . ATRIAL FIBRILLATION ABLATION N/A 05/22/2019   Procedure: ATRIAL FIBRILLATION ABLATION;  Surgeon: Constance Haw, MD;  Location: [place] [facility];  Service: Cardiovascular;  Laterality: N/A;  . CARDIOVERSION N/A 02/24/2018   Procedure: CARDIOVERSION;  Surgeon: Fay Records, MD;  Location: [facility];  Service: Cardiovascular;  Laterality: N/A;  . CARDIOVERSION N/A 08/15/2018   Procedure: CARDIOVERSION;  Surgeon: Thayer Headings, MD;  Location: [facility];  Service: Cardiovascular;  Laterality: N/A;  . COLONOSCOPY WITH PROPOFOL N/A 01/08/2018   Procedure: COLONOSCOPY WITH PROPOFOL;  Surgeon: Arta Silence, MD;  Location: [facility] ENDOSCOPY;  Service: Gastroenterology;  Laterality: N/A;  . CORONARY/GRAFT ANGIOGRAPHY N/A 08/14/2018   Procedure: CORONARY/GRAFT ANGIOGRAPHY;  Surgeon: [name], MD;  Location: [place] [facility];  Service: Cardiovascular;  Laterality: N/A;  . ESOPHAGOGASTRODUODENOSCOPY (EGD) WITH PROPOFOL N/A 01/06/2018   Procedure: ESOPHAGOGASTRODUODENOSCOPY (EGD) WITH PROPOFOL;  Surgeon: Otis Brace, MD;  Location: [facility] ENDOSCOPY;  Service: Gastroenterology;  Laterality: N/A;     Current Outpatient Medications  Medication Sig Dispense Refill  . amLODipine (NORVASC) 2.5 MG tablet Take 1 tablet (2.5 mg total) by mouth daily. 90 tablet 3  . Cholecalciferol (VITAMIN D) 2000 units tablet Take 2,000 Units by mouth daily.    . diphenhydramine-acetaminophen (TYLENOL PM) 25-500 MG TABS tablet Take  0.5 tablets by mouth at bedtime as needed (sleep).    . ferrous sulfate 325 (65 FE) MG tablet Take 325 mg by mouth daily with supper.    . levothyroxine (SYNTHROID) 125 MCG tablet Take 125 mcg  by mouth daily before breakfast.     . Multiple Vitamins-Minerals (PRESERVISION AREDS 2 PO) Take 1 capsule by mouth 2 (two) times daily.    . pantoprazole (PROTONIX) 40 MG tablet Take 40 mg by mouth 4 (four) times a week.    Jason Santana Glycol-Propyl Glycol (SYSTANE OP) Place 1 drop into both eyes daily as needed (dry eyes).    Jason Santana Kitchen Santana (BACTRIM DS) 800-160 MG tablet     . tobramycin (TOBREX) 0.3 % ophthalmic solution Place 1 drop into the left eye See admin instructions. Instill 1 drop into left eye 1 day before, the day of, and the day after eye injections administered at Dr's office    . valsartan (DIOVAN) 160 MG tablet Take 1 tablet (160 mg total) by mouth daily. 90 tablet 3  . warfarin (COUMADIN) 5 MG tablet Take 5 mg by mouth daily. Taking 7.5mg  on Wednesday and Sunday     No current facility-administered medications for this visit.    Allergies:   Lopid [gemfibrozil], Monopril [fosinopril], Vicodin [hydrocodone-acetaminophen], Phenergan [promethazine hcl], Zocor [simvastatin], Benicar [olmesartan], Codeine, and Prilosec [omeprazole]   Social History:  The patient  reports that he has quit smoking. His smoking use included cigarettes. He has never used smokeless tobacco. He reports that he does not drink alcohol or use drugs.   Family History:  The patient's family history includes Heart attack in his mother; Hypertension in his father and mother.   ROS:  Please see the history of present illness.   Otherwise, review of systems is positive for none.   All other systems are reviewed and negative.   PHYSICAL EXAM: VS:  There were no vitals taken for this visit. , BMI There is no height or weight on file to calculate BMI. GEN: Well nourished, well developed, in no acute distress  HEENT: normal  Neck: no JVD, carotid bruits, or masses Cardiac: RRR; no murmurs, rubs, or gallops,no edema  Respiratory:  clear to auscultation bilaterally, normal work of breathing GI:  soft, nontender, nondistended, + BS MS: no deformity or atrophy  Skin: warm and dry Neuro:  Strength and sensation are intact Psych: euthymic mood, full affect  EKG:  EKG is ordered today. Personal review of the ekg ordered shows sinus rhythm, IVCD, rate 83  Recent Labs: 10/22/2018: B Natriuretic Peptide 242.5 04/10/2019: Magnesium 1.9 04/15/2019: TSH 4.360 04/28/2019: ALT 62 05/31/2019: Hemoglobin 13.1; Platelets 192 08/05/2019: BUN 13; Creatinine, Ser 1.12; Potassium 4.2; Sodium 141    Lipid Panel     Component Value Date/Time   CHOL 130 04/15/2019 0905   TRIG 157 (H) 04/15/2019 0905   HDL 30 (L) 04/15/2019 0905   CHOLHDL 4.3 04/15/2019 0905   CHOLHDL 7.7 08/12/2018 0225   VLDL 48 (H) 08/12/2018 0225   LDLCALC 69 04/15/2019 0905     Wt Readings from Last 3 Encounters:  06/19/19 174 lb 9.6 oz (79.2 kg)  06/17/19 176 lb 4 oz (79.9 kg)  05/30/19 169 lb 12.1 oz (77 kg)      Other studies Reviewed: Additional studies/ records that were reviewed today include: TTE 08/12/18  Review of the above records today demonstrates:  - Left ventricle: The cavity size was normal. There was mild   concentric hypertrophy. Systolic function  was normal. The   estimated ejection fraction was in the range of 55% to 60%. Wall   motion was normal; there were no regional wall motion   abnormalities. - Ventricular septum: Septal motion showed paradox. These changes   are consistent with a post-thoracotomy state. - Aortic valve: A mechanical prosthesis was present and functioning   normally. - Mitral valve: Calcified annulus. There was mild regurgitation. - Left atrium: The atrium was mildly dilated. - Right ventricle: Systolic function was mildly to moderately   reduced. - Right atrium: The atrium was mildly dilated.  LHC 08/14/18  Mid RCA lesion is 100% stenosed.  Ost 2nd Mrg lesion is 100% stenosed.  Prox LAD to Mid LAD lesion is 50% stenosed.  SVG.  The graft exhibits mild .  1.   Severe two-vessel coronary artery disease with total occlusion of the OM 2 and the mid RCA, nonobstructive LAD stenosis 2.  Status post remote aortocoronary bypass surgery with continued patency of the LIMA to LAD, saphenous vein graft OM 2, and saphenous vein graft to PDA  ASSESSMENT AND PLAN:  1.  Persistent atrial fibrillation: Currently on warfarin.  He is now status post ablation at 05/22/2019.  CHA2DS2-VASc of 4.  His heart rate has been mildly elevated.  We Areli Frary thus plan to start carvedilol 6.25 mg.   2.  Mechanical aortic valve: Stable on most recent echo.  Plan per primary cardiology.  3.  Coronary artery disease: No current chest pain.  4.  Hypertension: Blood pressure is elevated today.  Woodley Petzold increase Norvasc to 10 mg and start carvedilol.  Current medicines are reviewed at length with the patient today.   The patient does not have concerns regarding his medicines.  The following changes were made today: Start carvedilol, increase Norvasc  Labs/ tests ordered today include:  No orders of the defined types were placed in this encounter.   Disposition:   FU with Nicolus Ose 6 months  Signed, Jamiria Langill Meredith Leeds, MD  08/20/2019 9:20 AM     [facility] [address] [place] [address] [phone] (office) [phone] (fax)

## 2019-08-21 ENCOUNTER — Telehealth: Payer: Self-pay | Admitting: Cardiology

## 2019-08-21 NOTE — Telephone Encounter (Signed)
New message   Pt has some questions about his medication. He said he takes 3 BP medications. They are amlodipine, carvedilol, valsartan. He would like a call.

## 2019-08-24 NOTE — Telephone Encounter (Signed)
lmtcb

## 2019-09-03 NOTE — Telephone Encounter (Signed)
lmtcb

## 2019-09-08 ENCOUNTER — Ambulatory Visit: Payer: Medicare Other

## 2019-09-08 ENCOUNTER — Telehealth: Payer: Self-pay | Admitting: Physician Assistant

## 2019-09-08 DIAGNOSIS — R062 Wheezing: Secondary | ICD-10-CM | POA: Diagnosis not present

## 2019-09-08 DIAGNOSIS — R609 Edema, unspecified: Secondary | ICD-10-CM | POA: Diagnosis not present

## 2019-09-08 DIAGNOSIS — Z7901 Long term (current) use of anticoagulants: Secondary | ICD-10-CM | POA: Diagnosis not present

## 2019-09-08 DIAGNOSIS — I10 Essential (primary) hypertension: Secondary | ICD-10-CM | POA: Diagnosis not present

## 2019-09-08 NOTE — Telephone Encounter (Signed)
Patient called stating his pressure was 200/90 and heart rate is 110.  He denies headache and blurry vision. I had him retake his pressure on the phone and it was 183/81, heart rate 91.  He went to his PCP this week, Dr. Inda Merlin, with swelling and a 15 lb weight gain. PCP D/C'ed his amlodipine and started 20 mg furosemide. He continued valsartan 160 mg. He did not start the coreg that Dr. Curt Bears started on 08/20/19.    In review of his chart, he has had many medication changes recently. The patient told me he wanted Dr. Inda Merlin to manage his BP medications, but he also said Dr. Inda Merlin doesn't have an after hours call line.   I told him to take another 160 mg valsartan now and retake his pressure in 1 hour after resting. If his pressure was still above 0000000 systolic, he needed to call back. We reviewed when to come to the ER via EMS. I also told him he needed to see either our HTN clinic tomorrow or Dr. Inda Merlin. I will send a message to HTN clinic. He expressed understanding of the plan.

## 2019-09-09 NOTE — Telephone Encounter (Addendum)
Followed up with pt. Pt reports that when he started the carvedilol and increased Norvasc last month he experienced extensive LEE, gained 15.8 lbs.  He also started wheezing on Carvedilol (one of the reasons Dr. Inda Merlin stopped it).  He followed up w/ Dr. Inda Merlin on 09/01/19 & he stopped both medications, started Lasix 20 mg daily.  Pt followed up again yesterday at Dr. Inda Merlin, weight back to baseline, ankle/pedal swelling still present - Dr. Inda Merlin continued daily Lasix for now. Pt reports that he is currently on taking Valsartan 160 mg for BP, no other medications on board. Reports that he did take one dose of Carvedilol last night d/t elevated HRs, but he has not taken anymore since last night. Pt scheduled to see HTN clinic tomorrow afternoon to further discuss

## 2019-09-10 ENCOUNTER — Ambulatory Visit (INDEPENDENT_AMBULATORY_CARE_PROVIDER_SITE_OTHER): Payer: Medicare Other | Admitting: Pharmacist

## 2019-09-10 ENCOUNTER — Encounter: Payer: Self-pay | Admitting: Pharmacist

## 2019-09-10 ENCOUNTER — Other Ambulatory Visit: Payer: Self-pay

## 2019-09-10 DIAGNOSIS — I10 Essential (primary) hypertension: Secondary | ICD-10-CM | POA: Diagnosis not present

## 2019-09-10 MED ORDER — VALSARTAN 320 MG PO TABS: 320.0000 mg | ORAL_TABLET | Freq: Every day | ORAL | 3 refills | Status: DC

## 2019-09-10 NOTE — Progress Notes (Signed)
Patient ID: Jason Santana                 DOB: 11-24-1931                      MRN: Jason Santana     HPI: Jason Santana is a 86 y.o. male referred by Dr. Curt Bears to HTN clinic. PMH is significant for HTN, CAD s/p CABG, afib s/p ablation in September 2020, and mechanical St Jude AVR (2000) on warfarin. He had a GI bleed May 2019 requiring a pause in anticoagulation.  He has had 3 cardioversions. Last ECHO 08/2018 with LVEF 55-60%.   Pt was last seen on 08/20/19 by Dr. Curt Bears. BP at this visit was 152/66 (HR 83 bpm), instructed to increase amlodipine from 2.5 mg to 5 mg daily and start carvedilol 6.125mg  twice a day. CHA2DS2-VASc of 4.   Telephone consult with patient on 09/07/18 patient reports that when he started the carvedilol and increased Norvasc (amlodipine) last month he experienced extensive LEE, gained 15.8 lbs.  He also started wheezing on Carvedilol (one of the reasons Dr. Inda Merlin stopped it).  He followed up w/ Dr. Inda Merlin on 09/01/19 & he stopped both medications, started Lasix (furosemide) 20 mg daily. Pt back to normal weight on 09/06/18, but ankle/pedal swelling still present - Dr. Inda Merlin continued daily Lasix for now. Pt reports that he is currently taking Valsartan 160 mg for BP, no other medications on board. Reports that he did take one dose of Carvedilol (09/06/18) due to elevated HRs (110), but he has not taken anymore since last night.  BMP on 09/07/2018 showed Scr of 1 and potassium of 4.3   Patient presents today to clinic for follow up. Patient reports mild swelling but has decreased with Lasix 20 mg. Denies blurred vision, dizziness, and shortness of breath. Confirms severe wheezing with carvedilol, so medication was stopped. However, he felt palpitations in his ear and had heart rate 110 bpm and blood pressure 200/90 and was instructed to take an extra valsartan and cardvedilol. Patient did get Omron upper arm blood pressure cuff and brought home readings to clinic today. Blood pressure  reading clinic was 150/70.  Current HTN meds: valsartan 160 mg once daily, furosemide 20 mg once daily  Previously tried: fosinopril (transaminitis), olmesartan (abdominal cramping/IBS), metoprolol (bradycardia), hydrochlorothiazide (hyponatremia), carvedilol (wheezing), amlodipine (extensive LEE, gained 15.8 lbs)  BP goal: <130/80  Family History: Heart attack in his mother; Hypertension in his father and mother.   Social History: former smoker (quit 62 years ago), never alcohol   Diet: eats 6 Cheez-its with each meal, chicken salad sandwiches, cereal, oatmeal, raisins in the morning, green beans, potatoes, okra, typically has light suppers Does not add salt to foods since surgery   Drinks water throughout the day, coffee on some mornings but not daily  Exercise: Patient tries to stay active  Home BP readings: Patient had 31 home BP readings from 08/07/2019 to 09/10/2019. Avg BP readings were 130-150's / 70-80s. A few elevated BP readings at 163/79, 165/78, and 200/90. There are 27 HR readings from 08/11/2019 to 09/09/2018. Avg HR readings are 70-80's with 3 readings in 60's.  Wt Readings from Last 3 Encounters:  08/20/19 181 lb (82.1 kg)  06/19/19 174 lb 9.6 oz (79.2 kg)  06/17/19 176 lb 4 oz (79.9 kg)   BP Readings from Last 3 Encounters:  08/20/19 (!) 152/66  08/05/19 (!) 162/58  07/08/19 (!) 152/68   Pulse Readings  from Last 3 Encounters:  08/20/19 83  08/05/19 85  07/08/19 83    Renal function: CrCl cannot be calculated (Patient's most recent lab result is older than the maximum 21 days allowed.).  Past Medical History:  Diagnosis Date  . Anticoagulation adequate 05/23/2019  . Atrial fibrillation ([place])   . CAD (coronary artery disease) of artery bypass graft 2000   2000 with multiple bypasses   . Chronic anticoagulation    Coumadin therapy for mechanical aortic valve. Postoperative atrial fibrillation.   . Essential hypertension   . H/O mechanical aortic valve  replacement 2000   St Jude AVR  . Hypothyroidism   . Premature ventricular contractions   . S/P ablation of atrial fibrillation 05/22/19 05/23/2019  . Typical atrial flutter ([place]) 05/23/2019    Current Outpatient Medications on File Prior to Visit  Medication Sig Dispense Refill  . amLODipine (NORVASC) 5 MG tablet Take 1 tablet (5 mg total) by mouth daily. 180 tablet 1  . carvedilol (COREG) 6.25 MG tablet Take 1 tablet (6.25 mg total) by mouth 2 (two) times daily. 60 tablet 3  . Cholecalciferol (VITAMIN D) 2000 units tablet Take 2,000 Units by mouth daily.    . diphenhydramine-acetaminophen (TYLENOL PM) 25-500 MG TABS tablet Take 0.5 tablets by mouth at bedtime as needed (sleep).    . ferrous sulfate 325 (65 FE) MG tablet Take 325 mg by mouth daily with supper.    . levothyroxine (SYNTHROID) 125 MCG tablet Take 125 mcg by mouth daily before breakfast.     . Multiple Vitamins-Minerals (PRESERVISION AREDS 2 PO) Take 1 capsule by mouth 2 (two) times daily.    . pantoprazole (PROTONIX) 40 MG tablet Take 40 mg by mouth 4 (four) times a week.    [name] Glycol-Propyl Glycol (SYSTANE OP) Place 1 drop into both eyes daily as needed (dry eyes).    [name] sulfamethoxazole-trimethoprim (BACTRIM DS) 800-160 MG tablet     . tobramycin (TOBREX) 0.3 % ophthalmic solution Place 1 drop into the left eye See admin instructions. Instill 1 drop into left eye 1 day before, the day of, and the day after eye injections administered at Dr's office    . valsartan (DIOVAN) 160 MG tablet Take 1 tablet (160 mg total) by mouth daily. 90 tablet 3  . warfarin (COUMADIN) 5 MG tablet Take 5 mg by mouth daily. Taking 7.5mg  on Wednesday and Sunday     No current facility-administered medications on file prior to visit.    Allergies  Allergen Reactions  . Lopid [Gemfibrozil] Other (See Comments)    transaminitis  . Monopril [Fosinopril] Other (See Comments)    transaminitis  . Vicodin [Hydrocodone-Acetaminophen]      Severe sensitivity  . Phenergan [Promethazine Hcl] Other (See Comments)    Severe somnolence.  . Zocor [Simvastatin]     Short memory loss.  [name] [Olmesartan]     Abdominal cramping and increased stools/ irritable bowel  . Codeine Nausea And Vomiting  . Prilosec [Omeprazole]     dyspepsia    Assessment/Plan:  1. Hypertension - Blood pressure not at goal of <130/80 today in clinic. Increase valsartan to 320 mg once daily. Continue furosemide 20mg  daily. I will send a message to Dr. Curt Bears about break though papillations/tachycardia. I would prefer to use diltiazem for this instead of carvedilol. Patient will continue taking blood pressure and heart rate at home daily.  Julieta Bellini, PharmD Candidate  Ramond Dial, [place].D, BCPS, CPP [facility]  [facility] [address] [place], [place] [address]  Phone: [phone]; Fax: [phone]

## 2019-09-10 NOTE — Patient Instructions (Addendum)
It was a pleasure to see you again!  Please increase your valsartan to 320mg  daily. You can take 2 capsules of the 160mg  daily until you run out.   I have sent a prescription over to your pharmacy for you for the 320mg  capsules.  I will talk to Dr. Curt Bears about a medication for break through palpitations/elevated heart rate  Please call us at [phone] with any questions or concerns.

## 2019-09-11 ENCOUNTER — Telehealth: Payer: Self-pay | Admitting: Pharmacist

## 2019-09-11 MED ORDER — DILTIAZEM HCL ER COATED BEADS 120 MG PO CP24: 120.0000 mg | ORAL_CAPSULE | Freq: Every day | ORAL | 11 refills | Status: DC

## 2019-09-11 MED ORDER — DILTIAZEM HCL 30 MG PO TABS: ORAL_TABLET | ORAL | 1 refills | Status: DC

## 2019-09-11 NOTE — Telephone Encounter (Signed)
Per Dr. Curt Bears ok to use diltiazem 30mg  prn palpitations. Rx sent to [facility].  Called and left voicemail for patient to return call. Will need to educated patient on when its appropriate to take

## 2019-09-11 NOTE — Telephone Encounter (Signed)
Patient called back and said the pharmacy only give him dilitazem 30mg  and he thought that he was supposed to have 120mg . States they were working on something else while he was there. Explained to the patient that the 30mg  tablet are only for if his HR is >100 or he has palpations. He to to put that aside and only use if needed. The 120mg  of Diltiazem is for daily use to lower his blood pressure and heart rate. He should NOT take 4 of the 30mg  tablets as these are immediate release. Patient voiced understanding.

## 2019-09-11 NOTE — Telephone Encounter (Signed)
Patient returned call. States his blood pressure was 206/92 last night HR 88. 137/66 the AM HR 67 165/75  5hr after Valsartan 320mg . Will continue valsartan 320mg  daily and add diltiazem 120mg  daily. Follow up in clinic on Feb4

## 2019-09-16 ENCOUNTER — Telehealth: Payer: Self-pay | Admitting: Pharmacist

## 2019-09-16 NOTE — Telephone Encounter (Signed)
Pt called clinic stating his BP yesterday was 190/88 and heart was racing at 11 and he is wanting something else to take.

## 2019-09-16 NOTE — Telephone Encounter (Addendum)
Patient is in good spirits on the phone. He was concerned due to BP reading of 190/88 and HR of 92 last. He did feel palpitations at this time but did not take diltiazem 30 mg. Other BP readings taken in the morning after taking medications: 1/8: 137/66  1/9: 134/63 1/10: 134/61 1/11: 143/65  1/12: 146/68 1/13: 144/67  The patient confirms that he is taking valsartan 320 mg once daily, diltiazem 120 mg once daily, and diltiazem 30 mg PRN for palpitations or HR > 100 bpm. No medication changes were made at this time. I explained to the patient that sometimes the blood pressure monitor may not be accurate when he has irregular heart rhythm. Advised the patient to start checking BP 2 hours after taking medications in the morning. Changed follow-up with patient to next Tuesday, January 19.  Julieta Bellini, PharmD Candidate

## 2019-09-22 ENCOUNTER — Ambulatory Visit (INDEPENDENT_AMBULATORY_CARE_PROVIDER_SITE_OTHER): Payer: Medicare Other | Admitting: Pharmacist

## 2019-09-22 ENCOUNTER — Encounter: Payer: Self-pay | Admitting: Pharmacist

## 2019-09-22 ENCOUNTER — Other Ambulatory Visit: Payer: Self-pay

## 2019-09-22 DIAGNOSIS — I10 Essential (primary) hypertension: Secondary | ICD-10-CM

## 2019-09-22 LAB — BASIC METABOLIC PANEL
BUN/Creatinine Ratio: 12 (ref 10–24)
BUN: 14 mg/dL (ref 8–27)
CO2: 21 mmol/L (ref 20–29)
Calcium: 9.8 mg/dL (ref 8.6–10.2)
Chloride: 102 mmol/L (ref 96–106)
Creatinine, Ser: 1.15 mg/dL (ref 0.76–1.27)
GFR calc Af Amer: 67 mL/min/{1.73_m2} (ref >59)
GFR calc non Af Amer: 58 mL/min/{1.73_m2} — ABNORMAL LOW (ref >59)
Glucose: 102 mg/dL — ABNORMAL HIGH (ref 65–99)
Potassium: 4.8 mmol/L (ref 3.5–5.2)
Sodium: 136 mmol/L (ref 134–144)

## 2019-09-22 NOTE — Patient Instructions (Addendum)
It was a pleasure to see you again today!  Please continue valsartan 320 mg once daily, diltiazem 120mg  daily and furosemide 20 mg once daily.   Continue exercising 30 min a day. Ride your bike at a pace that increases heart rate and breathing, but that is tolerable.  Continue checking blood pressure once a day.  If you blood pressure is elevated >180/90 and your HR is above 80 you may take one 30mg  diltiazem to bring it down.  Call the clinic 5-7 days before you run out of diltiazem with your blood pressure readings. We will decide at that time if we need to increase diltiazem or not.  Call us at [phone] with any questions or concerns

## 2019-09-22 NOTE — Progress Notes (Signed)
Patient ID: Jason Santana                 DOB: 24-May-1932                      MRN: [id number]     HPI: Jason Santana is a 86 y.o. male referred by Dr. Curt Bears to HTN clinic. PMH is significant for HTN, CAD s/p CABG, afib s/p ablation in September 2020, and mechanical St Jude AVR (2000) on warfarin. He had a GI bleed May 2019 requiring a pause in anticoagulation.  He has had 3 cardioversions. Last ECHO 08/2018 with LVEF 55-60%.   Pt was last seen on 08/20/19 by Dr. Curt Bears. BP at this visit was 152/66 (HR 83 bpm), instructed to increase amlodipine from 2.5 mg to 5 mg daily and start carvedilol 6.125mg  twice a day. CHA2DS2-VASc of 4.   Telephone consult with patient on 09/07/18 patient reports that when he started the carvedilol and increased Norvasc (amlodipine) last month he experienced extensive LEE, gained 15.8 lbs.  He also started wheezing on Carvedilol (one of the reasons Dr. Inda Merlin stopped it).  He followed up w/ Dr. Inda Merlin on 09/01/19 & he stopped both medications, started Lasix (furosemide) 20 mg daily. Pt back to normal weight on 09/06/18, but ankle/pedal swelling still present - Dr. Inda Merlin continued daily Lasix for now.  At last visit in the HTN clinic valsartan was increased to 320mg  due to elevated blood pressure. Patient had complained of one event of palpitations in his ear and elevated heart rate. We had planned to add diltiazem 30mg  prn palpitation, however patient called the clinic a few days later complaining of elevated blood pressures (206/92) and 165/75. Diltiazem 120mg  daily was added instead. Patient called the clinic last week reporting a reading of 190/88 HR of 92. All other readings were relatively well controlled in the 130's-140's. His appointment was moved up per request. Patient does have afib and he was advised that sometime home monitors are not accurate if he has an irregular heart beat.  Patient presents today for follow up. Patient provides his blood pressure readings. Most of  them are in the 130's-140's with a few in the evening in the 160-180. Patient started riding his stationary bike for 30-40 min every day, about 8 days ago. Patient states he is happy with his better heart rate control.   Current HTN meds: valsartan 320 mg once daily, diltiazem 120mg  daily, furosemide 20 mg once daily  Previously tried: fosinopril (transaminitis), olmesartan (abdominal cramping/IBS), metoprolol (bradycardia), hydrochlorothiazide (hyponatremia), carvedilol (wheezing), amlodipine (extensive LEE, gained 15.8 lbs)  BP goal: <130/80  Family History: Heart attack in his mother; Hypertension in his father and mother.   Social History: former smoker (quit 62 years ago), never alcohol   Diet: eats 6 Cheez-its with each meal, chicken salad sandwiches, cereal, oatmeal, raisins in the morning, green beans, potatoes, okra, typically has light suppers Does not add salt to foods since surgery   Drinks water throughout the day, coffee on some mornings but not daily  Exercise: Patient tries to stay active- riding stationary bike about 30 every day (started about 8-9 days ago)  Home BP readings:  1/8: 137/66  1/9: 134/63 1/10: 134/61 1/11: 143/65  1/12: 146/68, 6PM 190/88 HR 92 1/13: 144/67 6PM 165/69 HR 92, 183/77 HR 87 1/14: 144/68, 138/60 1/15: 145/72 1/16: 139/67 1/17: 137/68, 140/62 1/18: 136/66 1/19: 137/67 HR 67-83  Wt Readings from Last 3 Encounters:  08/20/19 181 lb (82.1 kg)  06/19/19 174 lb 9.6 oz (79.2 kg)  06/17/19 176 lb 4 oz (79.9 kg)   BP Readings from Last 3 Encounters:  08/20/19 (!) 152/66  08/05/19 (!) 162/58  07/08/19 (!) 152/68   Pulse Readings from Last 3 Encounters:  08/20/19 83  08/05/19 85  07/08/19 83    Renal function: CrCl cannot be calculated (Patient's most recent lab result is older than the maximum 21 days allowed.).  Past Medical History:  Diagnosis Date  . Anticoagulation adequate 05/23/2019  . Atrial fibrillation ([place])   . CAD  (coronary artery disease) of artery bypass graft 2000   2000 with multiple bypasses   . Chronic anticoagulation    Coumadin therapy for mechanical aortic valve. Postoperative atrial fibrillation.   . Essential hypertension   . H/O mechanical aortic valve replacement 2000   St Jude AVR  . Hypothyroidism   . Premature ventricular contractions   . S/P ablation of atrial fibrillation 05/22/19 05/23/2019  . Typical atrial flutter ([place]) 05/23/2019    Current Outpatient Medications on File Prior to Visit  Medication Sig Dispense Refill  . Cholecalciferol (VITAMIN D) 2000 units tablet Take 2,000 Units by mouth daily.    [name] diltiazem (CARDIZEM CD) 120 MG 24 hr capsule Take 1 capsule (120 mg total) by mouth daily. 30 capsule 11  . diltiazem (CARDIZEM) 30 MG tablet Take 1 tablet as needed for palpitations or heart rate >100 30 tablet 1  . diphenhydramine-acetaminophen (TYLENOL PM) 25-500 MG TABS tablet Take 0.5 tablets by mouth at bedtime as needed (sleep).    . ferrous sulfate 325 (65 FE) MG tablet Take 325 mg by mouth daily with supper.    . levothyroxine (SYNTHROID) 125 MCG tablet Take 125 mcg by mouth daily before breakfast.     . Multiple Vitamins-Minerals (PRESERVISION AREDS 2 PO) Take 1 capsule by mouth 2 (two) times daily.    . pantoprazole (PROTONIX) 40 MG tablet Take 40 mg by mouth 4 (four) times a week.    [name] Glycol-Propyl Glycol (SYSTANE OP) Place 1 drop into both eyes daily as needed (dry eyes).    [name] sulfamethoxazole-trimethoprim (BACTRIM DS) 800-160 MG tablet     . tobramycin (TOBREX) 0.3 % ophthalmic solution Place 1 drop into the left eye See admin instructions. Instill 1 drop into left eye 1 day before, the day of, and the day after eye injections administered at Dr's office    . valsartan (DIOVAN) 320 MG tablet Take 1 tablet (320 mg total) by mouth daily. 90 tablet 3  . warfarin (COUMADIN) 5 MG tablet Take 5 mg by mouth daily. Taking 7.5mg  on Wednesday and Sunday     No  current facility-administered medications on file prior to visit.    Allergies  Allergen Reactions  . Lopid [Gemfibrozil] Other (See Comments)    transaminitis  . Monopril [Fosinopril] Other (See Comments)    transaminitis  . Vicodin [Hydrocodone-Acetaminophen]     Severe sensitivity  . Phenergan [Promethazine Hcl] Other (See Comments)    Severe somnolence.  . Zocor [Simvastatin]     Short memory loss.  [name] [Olmesartan]     Abdominal cramping and increased stools/ irritable bowel  . Codeine Nausea And Vomiting  . Prilosec [Omeprazole]     dyspepsia    Assessment/Plan:  1. Hypertension - Blood pressure is above goal of <130/80 in clinic today. His home readings are also above goal, however many of them are in the 130's.  I wonder if his outlier blood pressures are due to inaccuracies due to afib or due to stress.  He recently started exercising on a regular basis. Patient states he plans to continue. We discussed several options including working on exercising, increasing diltiazem to 180mg  daily, adding spironolactone or adding hydralazine. Since patient still has about 20 days left of diltiazem, we have decided to hold off on any medications changes today. Patient will focus on exercise. He will call the clinic when he has about 5 days of diltiazem left to review his blood pressure readings. Will decide at that time to increase diltiazem or not. I advised the patient to check his blood pressure just once a day. If his blood pressure is elevated (>180/90) and HR is at least 80, he may take one diltiazem 30mg  tablet. Follow up via telephone in about 2 weeks. Checking a BMP today due to increase in valsartan at last visit.  Ramond Dial, Pharm.D, BCPS, CPP [facility]  [id number], [place], [place] [address]  Phone: [phone]; Fax: [phone]

## 2019-09-25 ENCOUNTER — Telehealth: Payer: Self-pay | Admitting: Cardiology

## 2019-09-25 NOTE — Telephone Encounter (Signed)
New Message  Pt was returning phone call that was received this morning.  Please call back

## 2019-09-25 NOTE — Telephone Encounter (Signed)
Returned call back to pt and he has been made aware of his lab results. See result note.

## 2019-10-03 ENCOUNTER — Other Ambulatory Visit: Payer: Self-pay | Admitting: Cardiology

## 2019-10-06 DIAGNOSIS — R062 Wheezing: Secondary | ICD-10-CM | POA: Diagnosis not present

## 2019-10-06 DIAGNOSIS — R609 Edema, unspecified: Secondary | ICD-10-CM | POA: Diagnosis not present

## 2019-10-06 DIAGNOSIS — M7918 Myalgia, other site: Secondary | ICD-10-CM | POA: Diagnosis not present

## 2019-10-06 DIAGNOSIS — I10 Essential (primary) hypertension: Secondary | ICD-10-CM | POA: Diagnosis not present

## 2019-10-06 DIAGNOSIS — Z7901 Long term (current) use of anticoagulants: Secondary | ICD-10-CM | POA: Diagnosis not present

## 2019-10-08 ENCOUNTER — Ambulatory Visit: Payer: Medicare Other

## 2019-10-13 ENCOUNTER — Ambulatory Visit: Payer: Medicare Other

## 2019-10-14 ENCOUNTER — Telehealth: Payer: Self-pay | Admitting: Pharmacist

## 2019-10-14 DIAGNOSIS — Z7901 Long term (current) use of anticoagulants: Secondary | ICD-10-CM | POA: Diagnosis not present

## 2019-10-14 NOTE — Telephone Encounter (Signed)
Called patient to follow up on his blood pressure. He states that it has been running good in the AM, but still high in the afternoons. He states that he saw Dr. Inda Merlin last week and he started him on amlodipine 2.5mg  daily. However patient is on diltiazem. I asked the patient if Dr. Inda Merlin knew he was on diltiazem and he said he thought he did, but then realized that it was not on his list from Dr. Inda Merlin. I explained that these medications are in the same class and we don't typically use them together. Patient is on diltiazem for heart rate control due to complaints of palpitations and tachycardia. He did not tolerate carvedilol- caused him to wheeze per patient, therefore hesitant to use another beta blocker I have scheduled patient an appointment in HTN clinic next week. I will reach out to Dr. Inda Merlin. I think it would be best to stop amlodipine- but will see if Dr. Inda Merlin feels strongly about continuing. I think our next step would be to either titrate diltiazem or start hydralazine due to his intolerances (fosinopril (transaminitis), olmesartan (abdominal cramping/IBS), metoprolol (bradycardia), hydrochlorothiazide (hyponatremia), carvedilol (wheezing), amlodipine (extensive LEE, gained 15.8 lbs)  AM:138/64, 150/66 Afternoons 167/68, 168/70

## 2019-10-21 ENCOUNTER — Ambulatory Visit (INDEPENDENT_AMBULATORY_CARE_PROVIDER_SITE_OTHER): Payer: Medicare Other | Admitting: Pharmacist

## 2019-10-21 ENCOUNTER — Encounter: Payer: Self-pay | Admitting: Pharmacist

## 2019-10-21 ENCOUNTER — Other Ambulatory Visit: Payer: Self-pay

## 2019-10-21 VITALS — BP 152/59 | HR 85

## 2019-10-21 DIAGNOSIS — I10 Essential (primary) hypertension: Secondary | ICD-10-CM | POA: Diagnosis not present

## 2019-10-21 MED ORDER — SPIRONOLACTONE 25 MG PO TABS: 25.0000 mg | ORAL_TABLET | Freq: Every day | ORAL | 3 refills | Status: DC

## 2019-10-21 NOTE — Patient Instructions (Addendum)
It was a pleasure to see you again!  From our visit today:  STOP taking amlodipine STOP furosemide daily and START taking 1 tablet daily only AS NEEDED START taking spironolactone 25mg  daily  Please come to lab on 2/24 anytime from 7:30-4:15  Follow up on 3/3 @ 10:30  Call us at [phone] with any questions or concerns

## 2019-10-21 NOTE — Progress Notes (Signed)
Patient ID: KEAGIN ARNTZEN                 DOB: 08/16/1932                      MRN: [id number]     HPI: Jason Santana is a 86 y.o. male referred by Dr. Curt Bears to HTN clinic. PMH is significant for HTN, CAD s/p CABG, afib s/p ablation in September 2020, and mechanical St Jude AVR (2000) on warfarin. He had a GI bleed May 2019 requiring a pause in anticoagulation.  He has had 3 cardioversions. Last ECHO 08/2018 with LVEF 55-60%. Recently patient has complained about palpitations in his ears and fast heart rate. He could not tolerate carvedilol due to wheezing, therefore he was started on diltiazem.  Pt was last seen in HTN clinic on 09/22/2019  BP at this visit was 140/54 (HR 79 bpm). He had recently started exercising and it was decided to allow a little more time before making a medication change. Options included increases diltiazem, adding spironolactone or adding hydralazine.   However on follow up phone call on 10/14/19 patient reported that he saw Dr. Inda Merlin in the mean time and his blood pressure was elevated (reported being in pain) and Dr. Inda Merlin re-started him on amlodipine 2.5mg  daily. We discussed that this is duplicate therapy as he is on diltiazem and I would reach out to Dr. Ninetta Lights. I sent fax over to office, but never heard back.   Patient presents today for follow up. He denies dizziness, lightheadedness, headache, blurred vision, SOB. Reports swelling (minor swelling in ankle). He states he has been riding his exercise bike, although he had to stop for a few days a few weeks ago due to a muscle spasm in his leg. He states his "heart is not out of rhythm as much- doesn't hear it in his ear as often"  Both him and his wife have had both their COVID shots.  Current HTN meds: valsartan 320 mg once daily, diltiazem 120mg  daily, furosemide 20 mg once daily, amlodipine 2.5mg  daily  Previously tried: fosinopril (transaminitis), olmesartan (abdominal cramping/IBS), metoprolol (bradycardia),  hydrochlorothiazide (hyponatremia), carvedilol (wheezing), amlodipine (extensive LEE, gained 15.8 lbs)  BP goal: <130/80  Family History: Heart attack in his mother; Hypertension in his father and mother.   Social History: former smoker (quit 62 years ago), never alcohol   Diet: eats 6 Cheez-its with each meal, chicken salad sandwiches, cereal, oatmeal, raisins in the morning, green beans, potatoes, okra, typically has light suppers Does not add salt to foods since surgery   Drinks water throughout the day, coffee on some mornings but not daily  Exercise: Patient tries to stay active- riding stationary bike about 30 every day   Home BP readings: 150/66, 168/70, 147/66, 186/71, 145/65, 167/68, 142/66, 138/70, 139/70, 137/63, 130/65, 134/62, 138/64, 138/65, 144/65, 167/73, 144/65, 146/62, 137/64, 131/62, 154/63, 134/66, 177/73, 154/70 HR 69-82   Wt Readings from Last 3 Encounters:  08/20/19 181 lb (82.1 kg)  06/19/19 174 lb 9.6 oz (79.2 kg)  06/17/19 176 lb 4 oz (79.9 kg)   BP Readings from Last 3 Encounters:  09/22/19 (!) 140/54  08/20/19 (!) 152/66  08/05/19 (!) 162/58   Pulse Readings from Last 3 Encounters:  09/22/19 79  08/20/19 83  08/05/19 85    Renal function: CrCl cannot be calculated (Patient's most recent lab result is older than the maximum 21 days allowed.).  Past Medical History:  Diagnosis  Date  . Anticoagulation adequate 05/23/2019  . Atrial fibrillation ([place])   . CAD (coronary artery disease) of artery bypass graft 2000   2000 with multiple bypasses   . Chronic anticoagulation    Coumadin therapy for mechanical aortic valve. Postoperative atrial fibrillation.   . Essential hypertension   . H/O mechanical aortic valve replacement 2000   St Jude AVR  . Hypothyroidism   . Premature ventricular contractions   . S/P ablation of atrial fibrillation 05/22/19 05/23/2019  . Typical atrial flutter ([place]) 05/23/2019    Current Outpatient Medications on File Prior  to Visit  Medication Sig Dispense Refill  . Cholecalciferol (VITAMIN D) 2000 units tablet Take 2,000 Units by mouth daily.    [name] diltiazem (CARDIZEM CD) 120 MG 24 hr capsule Take 1 capsule (120 mg total) by mouth daily. 30 capsule 11  . diltiazem (CARDIZEM) 30 MG tablet TAKE 1 TABLET AS NEEDED FOR PALPITATIONS OR HEART RATE >100 30 tablet 6  . diphenhydramine-acetaminophen (TYLENOL PM) 25-500 MG TABS tablet Take 0.5 tablets by mouth at bedtime as needed (sleep).    . ferrous sulfate 325 (65 FE) MG tablet Take 325 mg by mouth daily with supper.    . levothyroxine (SYNTHROID) 125 MCG tablet Take 125 mcg by mouth daily before breakfast.     . Multiple Vitamins-Minerals (PRESERVISION AREDS 2 PO) Take 1 capsule by mouth 2 (two) times daily.    . pantoprazole (PROTONIX) 40 MG tablet Take 40 mg by mouth 4 (four) times a week.    [name] Glycol-Propyl Glycol (SYSTANE OP) Place 1 drop into both eyes daily as needed (dry eyes).    [name] sulfamethoxazole-trimethoprim (BACTRIM DS) 800-160 MG tablet     . tobramycin (TOBREX) 0.3 % ophthalmic solution Place 1 drop into the left eye See admin instructions. Instill 1 drop into left eye 1 day before, the day of, and the day after eye injections administered at Dr's office    . valsartan (DIOVAN) 320 MG tablet Take 1 tablet (320 mg total) by mouth daily. 90 tablet 3  . warfarin (COUMADIN) 5 MG tablet Take 5 mg by mouth daily. Taking 7.5mg  on Wednesday and Sunday     No current facility-administered medications on file prior to visit.    Allergies  Allergen Reactions  . Carvedilol Shortness Of Breath    wheezing  . Lopid [Gemfibrozil] Other (See Comments)    transaminitis  . Monopril [Fosinopril] Other (See Comments)    transaminitis  . Vicodin [Hydrocodone-Acetaminophen]     Severe sensitivity  . Phenergan [Promethazine Hcl] Other (See Comments)    Severe somnolence.  . Zocor [Simvastatin]     Short memory loss.  [name] [Olmesartan]     Abdominal  cramping and increased stools/ irritable bowel  . Codeine Nausea And Vomiting  . Prilosec [Omeprazole]     dyspepsia    Assessment/Plan:  1. Hypertension - Blood pressure is above goal of <130/80. Will start spironolactone 25mg  daily. Check BMP in 1 week. I have asked the patient to stop taking furosemide daily and start taking only as needed. Will also stop amlodipine due to swelling and duplicate therapy. Follow up in clinic in 2 weeks.  Ramond Dial, Pharm.D, BCPS, CPP [place]  [id number] [place], [place] [address]  Phone: [phone]; Fax: [phone]

## 2019-10-26 ENCOUNTER — Telehealth: Payer: Self-pay | Admitting: Pharmacist

## 2019-10-26 DIAGNOSIS — R002 Palpitations: Secondary | ICD-10-CM

## 2019-10-26 NOTE — Telephone Encounter (Signed)
Patient states his rhythm and HR have gone wild over the last few days. States it is worse than before he had ablation.  Last night BP was 181/71 HR 95 Will increase diltiazem to 240mg  daily. Patient has follow up appointment with me on 3/3. He has requested that I send note to Dr. Curt Bears RN as he feels his rhythm is worse than before ablation. He does have appointment with Amber on 3/18 but patient seems to think that's too far away.

## 2019-10-27 ENCOUNTER — Other Ambulatory Visit: Payer: Medicare Other

## 2019-10-27 ENCOUNTER — Other Ambulatory Visit: Payer: Self-pay

## 2019-10-27 DIAGNOSIS — I10 Essential (primary) hypertension: Secondary | ICD-10-CM | POA: Diagnosis not present

## 2019-10-27 LAB — BASIC METABOLIC PANEL
BUN/Creatinine Ratio: 14 (ref 10–24)
BUN: 17 mg/dL (ref 8–27)
CO2: 20 mmol/L (ref 20–29)
Calcium: 9.8 mg/dL (ref 8.6–10.2)
Chloride: 100 mmol/L (ref 96–106)
Creatinine, Ser: 1.25 mg/dL (ref 0.76–1.27)
GFR calc Af Amer: 60 mL/min/{1.73_m2} (ref >59)
GFR calc non Af Amer: 52 mL/min/{1.73_m2} — ABNORMAL LOW (ref >59)
Glucose: 131 mg/dL — ABNORMAL HIGH (ref 65–99)
Potassium: 4.9 mmol/L (ref 3.5–5.2)
Sodium: 134 mmol/L (ref 134–144)

## 2019-10-27 NOTE — Telephone Encounter (Signed)
Pt reports doing better since increasing Diltiazem yesterday. However he does still feel "palpitations several times a week".   Feels different that before his ablation. Pt agreeable to a 2 week monitor to evaluate. Dr. Curt Bears agreeable. Aware office will call to arrange monitor to be mailed to home address and we will call once monitor returned/reviewed by MD. Patient verbalized understanding and agreeable to plan.

## 2019-10-28 ENCOUNTER — Other Ambulatory Visit: Payer: Medicare Other

## 2019-10-28 ENCOUNTER — Telehealth: Payer: Self-pay | Admitting: *Deleted

## 2019-10-28 NOTE — Telephone Encounter (Signed)
Attempt to contact patient to inform he has been enrolled for [employer] to mail a 14 day ZIO XT long term holter monitor to his home.  No answer, mail box full on home phone.  No answer, no mail box on cell phone.

## 2019-10-28 NOTE — Telephone Encounter (Signed)
Patient enrolled for [employer] to mail a 14 day ZIO XT long term monitor to his home.  Reviewed instructions briefly as they are included in the monitor kit.

## 2019-10-29 ENCOUNTER — Telehealth: Payer: Self-pay | Admitting: Pharmacist

## 2019-10-29 NOTE — Telephone Encounter (Signed)
Informed patient that labs are stable. Continue current medications and will see in clinic on 3/3. Patient states that he feels like its doing a lot better after increasing diltiazem.

## 2019-10-30 ENCOUNTER — Telehealth: Payer: Self-pay

## 2019-10-30 NOTE — Telephone Encounter (Signed)
-----   Message from Will Meredith Leeds, MD sent at 10/27/2019  3:44 PM EST ----- Stable labs

## 2019-10-30 NOTE — Telephone Encounter (Signed)
The patient has been notified of the result and verbalized understanding.  All questions (if any) were answered. Wilma Flavin, RN 10/30/2019 10:44 AM

## 2019-10-31 ENCOUNTER — Ambulatory Visit (INDEPENDENT_AMBULATORY_CARE_PROVIDER_SITE_OTHER): Payer: Medicare Other

## 2019-10-31 DIAGNOSIS — R002 Palpitations: Secondary | ICD-10-CM

## 2019-11-02 DIAGNOSIS — Z7901 Long term (current) use of anticoagulants: Secondary | ICD-10-CM | POA: Diagnosis not present

## 2019-11-02 DIAGNOSIS — M7918 Myalgia, other site: Secondary | ICD-10-CM | POA: Diagnosis not present

## 2019-11-02 DIAGNOSIS — I10 Essential (primary) hypertension: Secondary | ICD-10-CM | POA: Diagnosis not present

## 2019-11-02 DIAGNOSIS — R062 Wheezing: Secondary | ICD-10-CM | POA: Diagnosis not present

## 2019-11-02 DIAGNOSIS — R609 Edema, unspecified: Secondary | ICD-10-CM | POA: Diagnosis not present

## 2019-11-04 ENCOUNTER — Ambulatory Visit: Payer: Medicare Other

## 2019-11-05 ENCOUNTER — Ambulatory Visit (INDEPENDENT_AMBULATORY_CARE_PROVIDER_SITE_OTHER): Payer: Medicare Other | Admitting: Pharmacist

## 2019-11-05 ENCOUNTER — Other Ambulatory Visit: Payer: Self-pay

## 2019-11-05 VITALS — BP 140/50 | HR 74

## 2019-11-05 DIAGNOSIS — I10 Essential (primary) hypertension: Secondary | ICD-10-CM

## 2019-11-05 MED ORDER — DILTIAZEM HCL ER COATED BEADS 240 MG PO CP24: 240.0000 mg | ORAL_CAPSULE | Freq: Every day | ORAL | 3 refills | Status: DC

## 2019-11-05 NOTE — Progress Notes (Signed)
Patient ID: Jason Santana                 DOB: 1932-03-05                      MRN: [id number]     HPI: Jason Santana is a 86 y.o. male referred by Dr. Curt Santana to HTN clinic. PMH is significant for HTN, CAD s/p CABG, afib s/p ablation in September 2020, and mechanical St Jude AVR (2000) on warfarin. He had a GI bleed May 2019 requiring a pause in anticoagulation.  He has had 3 cardioversions. Last ECHO 08/2018 with LVEF 55-60%. Recently patient has complained about palpitations in his ears and fast heart rate. He could not tolerate carvedilol due to wheezing, therefore he was started on diltiazem.  Pt was last seen in HTN clinic on 10/21/2019  BP at this visit was 152/59 (HR 85 bpm). Spironolactone 25mg  daily was started. He called the clinic a few days later stating his rhythm and heartrate have gone wild. Diltiazem was increased to 240mg  daily and patient was set up with a holter monitor.  Patient presents today for follow up. He states that he feels more tired, worn out from his normal active lifestyle for the past week. He denies dizziness, lightheadedness, blurred vision or SOB. Has minor swelling that is almost gone. Still taking furosemide daily. States he gets a headache over his left eye sometime but this is a chronic thing. Blood pressure pretty good in the AM, still running higher around dinner time. Currently wearing a zio patch.  Both him and his wife have had both their COVID shots.  Current HTN meds: valsartan 320 mg once daily, diltiazem 240mg  daily, furosemide 20 mg once daily, spironolactone 25mg  daily  Previously tried: fosinopril (transaminitis), olmesartan (abdominal cramping/IBS), metoprolol (bradycardia), hydrochlorothiazide (hyponatremia), carvedilol (wheezing), amlodipine (extensive LEE, gained 15.8 lbs)  BP goal: <130/80  Family History: Heart attack in his mother; Hypertension in his father and mother.   Social History: former smoker (quit 62 years ago), never alcohol    Diet: eats 6 Cheez-its with each meal, chicken salad sandwiches, cereal, oatmeal, raisins in the morning, green beans, potatoes, okra, typically has light suppers Does not add salt to foods since surgery   Drinks water throughout the day, coffee on some mornings but not daily  Exercise: Patient tries to stay active- riding stationary bike about 30 every day   Home BP readings: 134/69, 152/62 PM, 135/62, 152/62PM, 123/65 152/63 PM, 141/63, 149/59 PM, 143/66, 157/65 PM, 138/63, 151/56 PM, 141/58 158/63 PM, 133/60 160/63 PM, 173/65 PM 144/61 HR 69-74  Wt Readings from Last 3 Encounters:  08/20/19 181 lb (82.1 kg)  06/19/19 174 lb 9.6 oz (79.2 kg)  06/17/19 176 lb 4 oz (79.9 kg)   BP Readings from Last 3 Encounters:  10/21/19 (!) 152/59  09/22/19 (!) 140/54  08/20/19 (!) 152/66   Pulse Readings from Last 3 Encounters:  10/21/19 85  09/22/19 79  08/20/19 83    Renal function: CrCl cannot be calculated (Unknown ideal weight.).  Past Medical History:  Diagnosis Date  . Anticoagulation adequate 05/23/2019  . Atrial fibrillation ([place])   . CAD (coronary artery disease) of artery bypass graft 2000   2000 with multiple bypasses   . Chronic anticoagulation    Coumadin therapy for mechanical aortic valve. Postoperative atrial fibrillation.   . Essential hypertension   . H/O mechanical aortic valve replacement 2000   St Jude AVR  .  Hypothyroidism   . Premature ventricular contractions   . S/P ablation of atrial fibrillation 05/22/19 05/23/2019  . Typical atrial flutter ([place]) 05/23/2019    Current Outpatient Medications on File Prior to Visit  Medication Sig Dispense Refill  . Cholecalciferol (VITAMIN D) 2000 units tablet Take 2,000 Units by mouth daily.    [name] diltiazem (CARDIZEM CD) 120 MG 24 hr capsule Take 1 capsule (120 mg total) by mouth daily. 30 capsule 11  . diltiazem (CARDIZEM) 30 MG tablet TAKE 1 TABLET AS NEEDED FOR PALPITATIONS OR HEART RATE >100 30 tablet 6  .  diphenhydramine-acetaminophen (TYLENOL PM) 25-500 MG TABS tablet Take 0.5 tablets by mouth at bedtime as needed (sleep).    . ferrous sulfate 325 (65 FE) MG tablet Take 325 mg by mouth daily with supper.    . levothyroxine (SYNTHROID) 125 MCG tablet Take 125 mcg by mouth daily before breakfast.     . Multiple Vitamins-Minerals (PRESERVISION AREDS 2 PO) Take 1 capsule by mouth 2 (two) times daily.    . pantoprazole (PROTONIX) 40 MG tablet Take 40 mg by mouth 4 (four) times a week.    [name] Glycol-Propyl Glycol (SYSTANE OP) Place 1 drop into both eyes daily as needed (dry eyes).    [name] spironolactone (ALDACTONE) 25 MG tablet Take 1 tablet (25 mg total) by mouth daily. 90 tablet 3  . sulfamethoxazole-trimethoprim (BACTRIM DS) 800-160 MG tablet     . tobramycin (TOBREX) 0.3 % ophthalmic solution Place 1 drop into the left eye See admin instructions. Instill 1 drop into left eye 1 day before, the day of, and the day after eye injections administered at Dr's office    . valsartan (DIOVAN) 320 MG tablet Take 1 tablet (320 mg total) by mouth daily. 90 tablet 3  . warfarin (COUMADIN) 5 MG tablet Take 5 mg by mouth daily. Taking 7.5mg  on Wednesday and Sunday     No current facility-administered medications on file prior to visit.    Allergies  Allergen Reactions  . Carvedilol Shortness Of Breath    wheezing  . Lopid [Gemfibrozil] Other (See Comments)    transaminitis  . Monopril [Fosinopril] Other (See Comments)    transaminitis  . Vicodin [Hydrocodone-Acetaminophen]     Severe sensitivity  . Phenergan [Promethazine Hcl] Other (See Comments)    Severe somnolence.  . Zocor [Simvastatin]     Short memory loss.  [name] [Olmesartan]     Abdominal cramping and increased stools/ irritable bowel  . Codeine Nausea And Vomiting  . Prilosec [Omeprazole]     dyspepsia    Assessment/Plan:  1. Hypertension - Blood pressure is above goal of <130/80 in clinic. Will increase spironolactone to 50mg   daily. Check BMP in 1 week. His fatigue could potentially be from diltiazem increase and lower HR (although still within normal limits). I will have patient continue same diltiazem dose for another week. If no improvements in fatigue, will try lowering dose back to 120mg  daily. Patient has appointment with Chanetta Marshall in 2 weeks. Will see patient back in clinic after that if needed. He is to call if fatigue does not improve.   Ramond Dial, Pharm.D, BCPS, CPP [facility] [facility]  [id number] [place], [place] [address]  Phone: [phone]; Fax: [phone]

## 2019-11-05 NOTE — Patient Instructions (Addendum)
It was a pleasure to see you again.  Please increase spironolactone to 50mg  daily. You may take 2 tablets of the 25mg  tablets to equal 50mg  daily.  Continue valsartan 320 mg once daily, diltiazem 240mg  daily and furosemide 20 mg once daily as needed.   Please call us at [phone] if your fatigue does not improve or worsens.

## 2019-11-08 ENCOUNTER — Encounter: Payer: Self-pay | Admitting: Interventional Cardiology

## 2019-11-08 DIAGNOSIS — I712 Thoracic aortic aneurysm, without rupture, unspecified: Secondary | ICD-10-CM | POA: Insufficient documentation

## 2019-11-12 ENCOUNTER — Telehealth: Payer: Self-pay | Admitting: Pharmacist

## 2019-11-12 ENCOUNTER — Other Ambulatory Visit: Payer: Medicare Other

## 2019-11-12 ENCOUNTER — Other Ambulatory Visit: Payer: Self-pay

## 2019-11-12 DIAGNOSIS — I10 Essential (primary) hypertension: Secondary | ICD-10-CM

## 2019-11-12 LAB — BASIC METABOLIC PANEL
BUN/Creatinine Ratio: 16 (ref 10–24)
BUN: 25 mg/dL (ref 8–27)
CO2: 21 mmol/L (ref 20–29)
Calcium: 9.6 mg/dL (ref 8.6–10.2)
Chloride: 99 mmol/L (ref 96–106)
Creatinine, Ser: 1.59 mg/dL — ABNORMAL HIGH (ref 0.76–1.27)
GFR calc Af Amer: 45 mL/min/{1.73_m2} — ABNORMAL LOW (ref >59)
GFR calc non Af Amer: 39 mL/min/{1.73_m2} — ABNORMAL LOW (ref >59)
Glucose: 138 mg/dL — ABNORMAL HIGH (ref 65–99)
Potassium: 4.6 mmol/L (ref 3.5–5.2)
Sodium: 134 mmol/L (ref 134–144)

## 2019-11-12 NOTE — Telephone Encounter (Signed)
Patient called and states that his fatigue has not improved and it has actually gotten worse. Feels weak and tired. HR in the 70's. Blood pressure was 131/61 this AM. May be a side effect of diltiazem. Patient did not have any issue until increasing diltiazem to 240mg . Will try decreasing diltiazem to 120mg  daily to see if patient has improvement in fatigue. Patient to call me in a week or two if no improvement. Patient reports he still has some 120mg  tabs at home.

## 2019-11-13 NOTE — Telephone Encounter (Signed)
Called patient to review labs. Left message for him to return call. Scr and bumped a little. It is less than 30% increase and patient blood pressure is improved. If patient swelling is ok, will try having to stop taking lasix daily and try taking prn. Will recheck scr on 3/18. If worsens will need to decrease spironolactone.

## 2019-11-13 NOTE — Telephone Encounter (Signed)
Spoke to patient. Advised him of his lab results. Patient still has some swelling, therefore will continue lasix, but will recheck BMP in 1 week on 3/18. If Scr worsens will need to reduce dose of spironolactone.

## 2019-11-17 NOTE — Progress Notes (Signed)
Electrophysiology Office Note Date: 11/19/2019  ID:  Jason Santana, DOB Mar 05, 1932, MRN [id number]  PCP: [name], MD Primary Cardiologist: Tamala Julian Electrophysiologist: Curt Bears  CC: AF follow up  Jason Santana is a 86 y.o. male seen today for Dr Curt Bears.  He presents today for routine electrophysiology followup.  Since last being seen in our clinic, the patient reports doing very well.  He denies chest pain, palpitations, dyspnea, PND, orthopnea, nausea, vomiting, dizziness, syncope, edema, weight gain, or early satiety.  Past Medical History:  Diagnosis Date  . Anticoagulation adequate 05/23/2019  . Atrial fibrillation ([place])   . CAD (coronary artery disease) of artery bypass graft 2000   2000 with multiple bypasses   . Chronic anticoagulation    Coumadin therapy for mechanical aortic valve. Postoperative atrial fibrillation.   . Essential hypertension   . H/O mechanical aortic valve replacement 2000   St Jude AVR  . Hypothyroidism   . Premature ventricular contractions   . S/P ablation of atrial fibrillation 05/22/19 05/23/2019  . Typical atrial flutter ([place]) 05/23/2019   Past Surgical History:  Procedure Laterality Date  . AORTIC VALVE REPLACEMENT (AVR)/CORONARY ARTERY BYPASS GRAFTING (CABG)  2000  . ATRIAL FIBRILLATION ABLATION N/A 05/22/2019   Procedure: ATRIAL FIBRILLATION ABLATION;  Surgeon: Constance Haw, MD;  Location: [place] [facility];  Service: Cardiovascular;  Laterality: N/A;  . CARDIOVERSION N/A 02/24/2018   Procedure: CARDIOVERSION;  Surgeon: Fay Records, MD;  Location: [facility];  Service: Cardiovascular;  Laterality: N/A;  . CARDIOVERSION N/A 08/15/2018   Procedure: CARDIOVERSION;  Surgeon: Thayer Headings, MD;  Location: [place];  Service: Cardiovascular;  Laterality: N/A;  . COLONOSCOPY WITH PROPOFOL N/A 01/08/2018   Procedure: COLONOSCOPY WITH PROPOFOL;  Surgeon: Arta Silence, MD;  Location: [facility];  Service: Gastroenterology;   Laterality: N/A;  . CORONARY/GRAFT ANGIOGRAPHY N/A 08/14/2018   Procedure: CORONARY/GRAFT ANGIOGRAPHY;  Surgeon: [name], MD;  Location: [place] [facility];  Service: Cardiovascular;  Laterality: N/A;  . ESOPHAGOGASTRODUODENOSCOPY (EGD) WITH PROPOFOL N/A 01/06/2018   Procedure: ESOPHAGOGASTRODUODENOSCOPY (EGD) WITH PROPOFOL;  Surgeon: Otis Brace, MD;  Location: [facility];  Service: Gastroenterology;  Laterality: N/A;    Current Outpatient Medications  Medication Sig Dispense Refill  . Cholecalciferol (VITAMIN D) 2000 units tablet Take 2,000 Units by mouth daily.    [name] diltiazem (CARDIZEM CD) 120 MG 24 hr capsule Take 1 capsule (120 mg total) by mouth daily. 90 capsule 3  . diltiazem (CARDIZEM) 30 MG tablet TAKE 1 TABLET AS NEEDED FOR PALPITATIONS OR HEART RATE >100 30 tablet 6  . diphenhydramine-acetaminophen (TYLENOL PM) 25-500 MG TABS tablet Take 0.5 tablets by mouth at bedtime as needed (sleep).    . ferrous sulfate 325 (65 FE) MG tablet Take 325 mg by mouth daily with supper.    . furosemide (LASIX) 20 MG tablet Take 20 mg by mouth daily.    [name] levothyroxine (SYNTHROID) 125 MCG tablet Take 125 mcg by mouth daily before breakfast.     . Multiple Vitamins-Minerals (PRESERVISION AREDS 2 PO) Take 1 capsule by mouth 2 (two) times daily.    . pantoprazole (PROTONIX) 40 MG tablet Take 40 mg by mouth 4 (four) times a week.    [name] Glycol-Propyl Glycol (SYSTANE OP) Place 1 drop into both eyes daily as needed (dry eyes).    [name] spironolactone (ALDACTONE) 25 MG tablet Take 2 tablets (50 mg total) by mouth daily. 90 tablet 3  . sulfamethoxazole-trimethoprim (BACTRIM DS) 800-160  MG tablet     . tobramycin (TOBREX) 0.3 % ophthalmic solution Place 1 drop into the left eye See admin instructions. Instill 1 drop into left eye 1 day before, the day of, and the day after eye injections administered at Dr's office    . valsartan (DIOVAN) 320 MG tablet Take 1 tablet (320 mg total) by mouth  daily. 90 tablet 3  . warfarin (COUMADIN) 7.5 MG tablet Take 7.5 mg by mouth daily. Per patient taking 5mg  on Sundays     No current facility-administered medications for this visit.    Allergies:   Carvedilol, Lopid [gemfibrozil], Monopril [fosinopril], Vicodin [hydrocodone-acetaminophen], Phenergan [promethazine hcl], Zocor [simvastatin], Benicar [olmesartan], Codeine, and Prilosec [omeprazole]   Social History: Social History   Socioeconomic History  . Marital status: Married    Spouse name: Not on file  . Number of children: Not on file  . Years of education: Not on file  . Highest education level: Not on file  Occupational History  . Not on file  Tobacco Use  . Smoking status: Former Smoker    Types: Cigarettes  . Smokeless tobacco: Never Used  . Tobacco comment: quit 57 years ago  Substance and Sexual Activity  . Alcohol use: No  . Drug use: No  . Sexual activity: Not on file  Other Topics Concern  . Not on file  Social History Narrative  . Not on file   Social Determinants of Health   Financial Resource Strain:   . Difficulty of Paying Living Expenses:   Food Insecurity:   . Worried About Charity fundraiser in the Last Year:   . Arboriculturist in the Last Year:   Transportation Needs:   . Film/video editor (Medical):   [name] Lack of Transportation (Non-Medical):   Physical Activity:   . Days of Exercise per Week:   . Minutes of Exercise per Session:   Stress:   . Feeling of Stress :   Social Connections:   . Frequency of Communication with Friends and Family:   . Frequency of Social Gatherings with Friends and Family:   . Attends Religious Services:   . Active Member of Clubs or Organizations:   . Attends Archivist Meetings:   [name] Marital Status:   Intimate Partner Violence:   . Fear of Current or Ex-Partner:   . Emotionally Abused:   [name] Physically Abused:   . Sexually Abused:     Family History: Family History  Problem Relation Age of  Onset  . Hypertension Mother   . Heart attack Mother   . Hypertension Father     Review of Systems: All other systems reviewed and are otherwise negative except as noted above.   Physical Exam: VS:  BP (!) 140/58   Pulse 79   Ht 5\' 10"  (1.778 m)   Wt 180 lb 12.8 oz (82 kg)   SpO2 97%   BMI 25.94 kg/m  , BMI Body mass index is 25.94 kg/m. Wt Readings from Last 3 Encounters:  11/19/19 180 lb 12.8 oz (82 kg)  08/20/19 181 lb (82.1 kg)  06/19/19 174 lb 9.6 oz (79.2 kg)    GEN- The patient is elderly appearing, alert and oriented x 3 today.   HEENT: normocephalic, atraumatic; sclera clear, conjunctiva pink; hearing intact; oropharynx clear; neck supple Lungs- Clear to ausculation bilaterally, normal work of breathing.  No wheezes, rales, rhonchi Heart- Regular rate and rhythm GI- soft, non-tender, non-distended, bowel sounds present  Extremities- no clubbing, cyanosis, or edema MS- no significant deformity or atrophy Skin- warm and dry, no rash or lesion  Psych- euthymic mood, full affect Neuro- strength and sensation are intact   EKG:  EKG is ordered today. The ekg ordered today shows SR, rate 79, LAFB  Recent Labs: 04/10/2019: Magnesium 1.9 04/15/2019: TSH 4.360 04/28/2019: ALT 62 05/31/2019: Hemoglobin 13.1; Platelets 192 11/12/2019: BUN 25; Creatinine, Ser 1.59; Potassium 4.6; Sodium 134    Other studies Reviewed: Additional studies/ records that were reviewed today include: Dr Curt Bears office notes  Assessment and Plan:  1. Persistent atrial fibrillation S/p ablation 05/2019 Maintaining SR by symptoms Continue Warfarin for CHADS2VASC of 3 Previously on amiodarone - discontinued 2/2 LFT and TSH elevation  2.  S/p mechanical AVR Followed by Dr Tamala Julian  3.  HTN Stable No change required today BMET today per HTN clinic    Current medicines are reviewed at length with the patient today.   The patient does not have concerns regarding his medicines.  The following  changes were made today:  none  Labs/ tests ordered today include: BMET Orders Placed This Encounter  Procedures  . EKG 12-Lead     Disposition:   Follow up with Dr Curt Bears 3 months     Signed, Chanetta Marshall, NP 11/19/2019 11:19 AM   [facility] [address] [place]  [address] [phone] (office) [phone] (fax)

## 2019-11-19 ENCOUNTER — Encounter: Payer: Self-pay | Admitting: Nurse Practitioner

## 2019-11-19 ENCOUNTER — Other Ambulatory Visit: Payer: Self-pay

## 2019-11-19 ENCOUNTER — Ambulatory Visit (INDEPENDENT_AMBULATORY_CARE_PROVIDER_SITE_OTHER): Payer: Medicare Other | Admitting: Nurse Practitioner

## 2019-11-19 ENCOUNTER — Other Ambulatory Visit: Payer: Medicare Other | Admitting: *Deleted

## 2019-11-19 VITALS — BP 140/58 | HR 79 | Ht 70.0 in | Wt 180.8 lb

## 2019-11-19 DIAGNOSIS — I10 Essential (primary) hypertension: Secondary | ICD-10-CM | POA: Diagnosis not present

## 2019-11-19 DIAGNOSIS — I4819 Other persistent atrial fibrillation: Secondary | ICD-10-CM

## 2019-11-19 DIAGNOSIS — Z952 Presence of prosthetic heart valve: Secondary | ICD-10-CM

## 2019-11-19 DIAGNOSIS — D6869 Other thrombophilia: Secondary | ICD-10-CM

## 2019-11-19 LAB — BASIC METABOLIC PANEL
BUN/Creatinine Ratio: 11 (ref 10–24)
BUN: 16 mg/dL (ref 8–27)
CO2: 21 mmol/L (ref 20–29)
Calcium: 9.8 mg/dL (ref 8.6–10.2)
Chloride: 99 mmol/L (ref 96–106)
Creatinine, Ser: 1.41 mg/dL — ABNORMAL HIGH (ref 0.76–1.27)
GFR calc Af Amer: 52 mL/min/{1.73_m2} — ABNORMAL LOW (ref >59)
GFR calc non Af Amer: 45 mL/min/{1.73_m2} — ABNORMAL LOW (ref >59)
Glucose: 151 mg/dL — ABNORMAL HIGH (ref 65–99)
Potassium: 4.3 mmol/L (ref 3.5–5.2)
Sodium: 133 mmol/L — ABNORMAL LOW (ref 134–144)

## 2019-11-19 NOTE — Addendum Note (Signed)
Addended by: Marcelle Overlie D on: 11/19/2019 07:12 AM   Modules accepted: Orders

## 2019-11-19 NOTE — Patient Instructions (Addendum)
Medication Instructions:  none *If you need a refill on your cardiac medications before your next appointment, please call your pharmacy*   Lab Work: none If you have labs (blood work) drawn today and your tests are completely normal, you will receive your results only by: [name] MyChart Message (if you have MyChart) OR . A paper copy in the mail If you have any lab test that is abnormal or we need to change your treatment, we will call you to review the results.   Testing/Procedures: none   Follow-Up: Dr Curt Bears in June At [facility], you and your health needs are our priority.  As part of our continuing mission to provide you with exceptional heart care, we have created designated Provider Care Teams.  These Care Teams include your primary Cardiologist (physician) and Advanced Practice Providers (APPs -  Physician Assistants and Nurse Practitioners) who all work together to provide you with the care you need, when you need it.  We recommend signing up for the patient portal called "MyChart".  Sign up information is provided on this After Visit Summary.  MyChart is used to connect with patients for Virtual Visits (Telemedicine).  Patients are able to view lab/test results, encounter notes, upcoming appointments, etc.  Non-urgent messages can be sent to your provider as well.   To learn more about what you can do with MyChart, go to [email or web address].      Other Instructions

## 2019-11-20 ENCOUNTER — Telehealth: Payer: Self-pay | Admitting: Pharmacist

## 2019-11-20 NOTE — Telephone Encounter (Addendum)
SCr has improved a bit, BMET overall stable. BP remained elevated at visit yesterday. Spoke with pt and advised him to continue current meds for now. Scheduled f/u in HTN clinic in ~3 weeks. He will bring in log of home BP readings to his visit.

## 2019-11-25 DIAGNOSIS — R002 Palpitations: Secondary | ICD-10-CM | POA: Diagnosis not present

## 2019-12-03 ENCOUNTER — Telehealth: Payer: Self-pay | Admitting: Pharmacist

## 2019-12-03 DIAGNOSIS — Z7901 Long term (current) use of anticoagulants: Secondary | ICD-10-CM | POA: Diagnosis not present

## 2019-12-03 NOTE — Telephone Encounter (Signed)
Called patient to see if he would be willing to move his appointment from Monday at 8:30 to tomorrow Friday 4/2 @ 1:00. Left VM to return call.

## 2019-12-06 NOTE — Progress Notes (Signed)
Patient ID: Jason Santana                 DOB: 1932/04/22                      MRN: [id number]     HPI: Jason Santana is a 86 y.o. male referred by Dr. Curt Bears to HTN clinic. PMH is significant for HTN, CAD s/p CABG, afib s/p ablation in September 2020, and mechanical St Jude AVR (2000) on warfarin. He had a GI bleed May 2019 requiring a pause in anticoagulation.  He has had 3 cardioversions. Last ECHO 08/2018 with LVEF 55-60%. Recently patient has complained about palpitations in his ears and fast heart rate. He could not tolerate carvedilol due to wheezing, therefore he was started on diltiazem.  At HTN clinic visit on 10/21/2019, BP was 152/59 (HR 85 bpm) so spironolactone 25mg  daily was started. He called the clinic a few days later stating his rhythm and heartrate have gone wild. Diltiazem was increased to 240mg  daily and patient was set up with a holter monitor.  At follow-up visit on 11/19/19, BP was improved but still elevated at 140/50. His home BP was pretty good in the AM but was still running higher around dinner time. Spironolactone was increased to 50mg  daily. He continued to have chronic HA over left eye and endorsed increasing fatigue. On 11/12/19, patient called stating that his fatigue had gotten worse but BP was improved at 131/61. He was instructed to decrease diltiazem to 120mg  daily. He was then seen by Chanetta Marshall, NP on 11/19/19 where BP was still elevated at 140/58 and Scr had improved. No changes were made at that time.  Patient presents today for follow up. He states that he feels much better on the diltiazem 120mg  daily. He is back to being active, working on his cars. He is not biking like he use to, but that is just because he has been busy doing other things. He denies dizziness, lightheadedness, headache, blurred vision, SOB or swelling. He brings in an extensive blood pressure log with 2 readings per day. His AM readings are before medications. Average is [id number] systolic. PM  average was 132.5 (mainly due to a few outlier elevated BPs).   Current HTN meds: valsartan 320 mg once daily, diltiazem 120mg  daily, furosemide 20 mg once daily, spironolactone 50mg  daily  Previously tried: fosinopril (transaminitis), olmesartan (abdominal cramping/IBS), metoprolol (bradycardia), hydrochlorothiazide (hyponatremia), carvedilol (wheezing), amlodipine (extensive LEE, gained 15.8 lbs)  BP goal: <130/80  Family History: Heart attack in his mother; Hypertension in his father and mother.   Social History: former smoker (quit 62 years ago), never alcohol   Diet: eats 6 Cheez-its with each meal, chicken salad sandwiches, cereal, oatmeal, raisins in the morning, green beans, potatoes, okra, typically has light suppers Does not add salt to foods since surgery   Drinks water throughout the day, coffee on some mornings but not daily  Exercise: Patient tries to stay active- riding stationary bike about 30 every day   Home BP readings: AM before meds 129/54, 136/62, 129/61, 144/65, 139/64, 143/66, 136/64, 121/60, 132/63, 125/61, 129/62, 128/62, 122/58, 134/63, 130/63, 127/61, 135/61 PM: 136/56, 131/54, 155/59, 149/63, 133/56, 131/55, 145/61, 116/52, 132/56, 136/58, 122/54, 134/58, 128/54, 150/61, 124/54, 115/54, 117/51 HR 70-80's  Wt Readings from Last 3 Encounters:  11/19/19 180 lb 12.8 oz (82 kg)  08/20/19 181 lb (82.1 kg)  06/19/19 174 lb 9.6 oz (79.2 kg)   BP Readings from  Last 3 Encounters:  11/19/19 (!) 140/58  11/05/19 (!) 140/50  10/21/19 (!) 152/59   Pulse Readings from Last 3 Encounters:  11/19/19 79  11/05/19 74  10/21/19 85    Renal function: CrCl cannot be calculated (Unknown ideal weight.).  Past Medical History:  Diagnosis Date  . Anticoagulation adequate 05/23/2019  . Atrial fibrillation ([place])   . CAD (coronary artery disease) of artery bypass graft 2000   2000 with multiple bypasses   . Chronic anticoagulation    Coumadin therapy for mechanical  aortic valve. Postoperative atrial fibrillation.   . Essential hypertension   . H/O mechanical aortic valve replacement 2000   St Jude AVR  . Hypothyroidism   . Premature ventricular contractions   . S/P ablation of atrial fibrillation 05/22/19 05/23/2019  . Typical atrial flutter ([place]) 05/23/2019    Current Outpatient Medications on File Prior to Visit  Medication Sig Dispense Refill  . Cholecalciferol (VITAMIN D) 2000 units tablet Take 2,000 Units by mouth daily.    [name] diltiazem (CARDIZEM CD) 120 MG 24 hr capsule Take 1 capsule (120 mg total) by mouth daily. 90 capsule 3  . diltiazem (CARDIZEM) 30 MG tablet TAKE 1 TABLET AS NEEDED FOR PALPITATIONS OR HEART RATE >100 30 tablet 6  . diphenhydramine-acetaminophen (TYLENOL PM) 25-500 MG TABS tablet Take 0.5 tablets by mouth at bedtime as needed (sleep).    . ferrous sulfate 325 (65 FE) MG tablet Take 325 mg by mouth daily with supper.    . furosemide (LASIX) 20 MG tablet Take 20 mg by mouth daily.    [name] levothyroxine (SYNTHROID) 125 MCG tablet Take 125 mcg by mouth daily before breakfast.     . Multiple Vitamins-Minerals (PRESERVISION AREDS 2 PO) Take 1 capsule by mouth 2 (two) times daily.    . pantoprazole (PROTONIX) 40 MG tablet Take 40 mg by mouth 4 (four) times a week.    [name] Glycol-Propyl Glycol (SYSTANE OP) Place 1 drop into both eyes daily as needed (dry eyes).    [name] spironolactone (ALDACTONE) 25 MG tablet Take 2 tablets (50 mg total) by mouth daily. 90 tablet 3  . sulfamethoxazole-trimethoprim (BACTRIM DS) 800-160 MG tablet     . tobramycin (TOBREX) 0.3 % ophthalmic solution Place 1 drop into the left eye See admin instructions. Instill 1 drop into left eye 1 day before, the day of, and the day after eye injections administered at Dr's office    . valsartan (DIOVAN) 320 MG tablet Take 1 tablet (320 mg total) by mouth daily. 90 tablet 3  . warfarin (COUMADIN) 7.5 MG tablet Take 7.5 mg by mouth daily. Per patient taking 5mg  on  Sundays     No current facility-administered medications on file prior to visit.    Allergies  Allergen Reactions  . Carvedilol Shortness Of Breath    wheezing  . Lopid [Gemfibrozil] Other (See Comments)    transaminitis  . Monopril [Fosinopril] Other (See Comments)    transaminitis  . Vicodin [Hydrocodone-Acetaminophen]     Severe sensitivity  . Phenergan [Promethazine Hcl] Other (See Comments)    Severe somnolence.  . Zocor [Simvastatin]     Short memory loss.  [name] [Olmesartan]     Abdominal cramping and increased stools/ irritable bowel  . Codeine Nausea And Vomiting  . Prilosec [Omeprazole]     dyspepsia    Assessment/Plan:  1. Hypertension - Patients blood pressure is not at goal of <130/80 today in clinic. However, his home readings have  been very close to goal and the most recent evening readings have been at goal. I have encouraged patient to start biking or walking again and to continue to be active in his garage. No medication changes today. I will follow up with patient in 3-4 weeks to see how his blood pressures are going. It appears patients blood pressure does run higher in clinic than at home. Refills were sent to pharmacy for diltiazem and spironolactone per patient request.  Ramond Dial, Pharm.D, BCPS, CPP [place]  [id number], [place], [place] [address]  Phone: [phone]; Fax: [phone]

## 2019-12-07 ENCOUNTER — Encounter: Payer: Self-pay | Admitting: Pharmacist

## 2019-12-07 ENCOUNTER — Ambulatory Visit (INDEPENDENT_AMBULATORY_CARE_PROVIDER_SITE_OTHER): Payer: Medicare Other | Admitting: Pharmacist

## 2019-12-07 ENCOUNTER — Other Ambulatory Visit: Payer: Self-pay

## 2019-12-07 VITALS — BP 140/50 | HR 78

## 2019-12-07 DIAGNOSIS — I10 Essential (primary) hypertension: Secondary | ICD-10-CM | POA: Diagnosis not present

## 2019-12-07 MED ORDER — SPIRONOLACTONE 50 MG PO TABS: 50.0000 mg | ORAL_TABLET | Freq: Every day | ORAL | 3 refills | Status: DC

## 2019-12-07 MED ORDER — DILTIAZEM HCL ER COATED BEADS 120 MG PO CP24: 120.0000 mg | ORAL_CAPSULE | Freq: Every day | ORAL | 3 refills | Status: DC

## 2019-12-07 NOTE — Patient Instructions (Addendum)
It was a pleasure to see you today!  Please continue taking valsartan 320 mg once daily, diltiazem 120mg  daily, furosemide 20 mg once daily, spironolactone 50mg  daily  Continue checking your blood pressure once a day, at least 2 hours after taking your medications  Your blood pressure goal is <130/80. Your blood pressure numbers appear to be trending towards consistently at goal.  Make sure you are getting 150 min of aerobic (walking/biking) exercise per week.  Give [place] a call at [phone] with any questions or concerns

## 2019-12-22 DIAGNOSIS — H353222 Exudative age-related macular degeneration, left eye, with inactive choroidal neovascularization: Secondary | ICD-10-CM | POA: Diagnosis not present

## 2019-12-31 DIAGNOSIS — Z7901 Long term (current) use of anticoagulants: Secondary | ICD-10-CM | POA: Diagnosis not present

## 2020-01-04 ENCOUNTER — Telehealth: Payer: Self-pay | Admitting: Pharmacist

## 2020-01-04 NOTE — Telephone Encounter (Signed)
101/48 126/55 130/55-60 126/50 130/60 129/59 117/52  Denies dizziness/lightheaded. Some headache with neck ache- suggested he do neck stretches to help aleviate tightness in neck.  Patient to continue valsartan 320mg  daily, diltiazem 120mg  daily and spironolactone 50mg  daily. Patient to call if there are any issues. At this time no planned follow up is needed.

## 2020-01-10 IMAGING — CT CT ABD-PELV W/ CM
2 of 5 series · 15 of 46 positions shown, 17 images · IV contrast (iopamidol)
Comparison: Prior CT scan of the abdomen and pelvis 05/26/2017

CLINICAL DATA: 85-year-old male with upper abdominal pain, severe
nausea and weight loss. She has also had 2 episodes of bloody
stools.

EXAM:
CT ABDOMEN AND PELVIS WITH CONTRAST
TECHNIQUE: Multidetector CT imaging of the abdomen and pelvis was performed
using the standard protocol following bolus administration of
intravenous contrast.
CONTRAST:  100mL 2AZJLT-SHH IOPAMIDOL (2AZJLT-SHH) INJECTION 61%

[Series 2: abd pelvis 5.00 br40 s3 ax · axial · 0.68mm/px · z∈[+1439,+1889]mm · 12 of 101 slices shown, 14 images]
[im 6/101  soft-tissue]
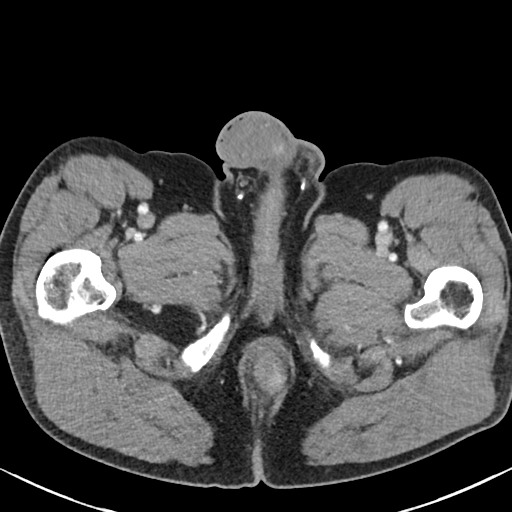
[im 6/101  bone]
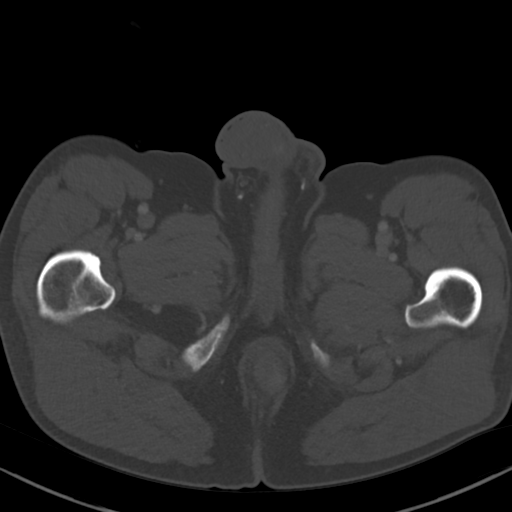
[im 16/101  soft-tissue]
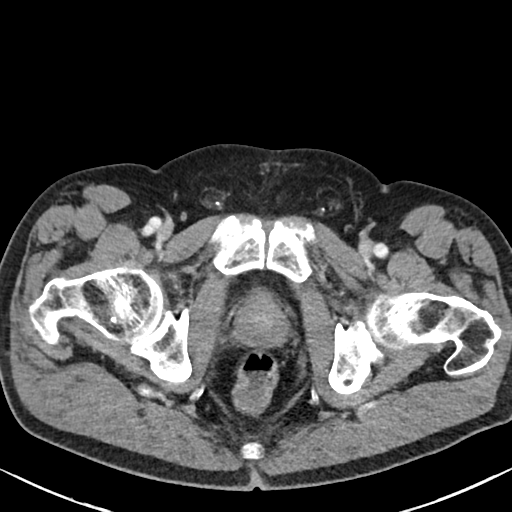
[im 21/101  soft-tissue]
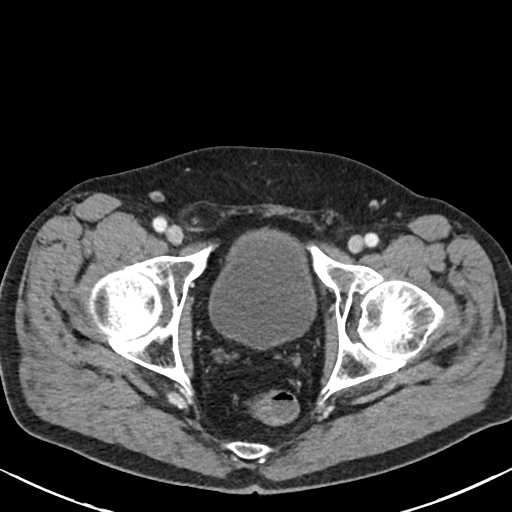
[im 31/101  soft-tissue]
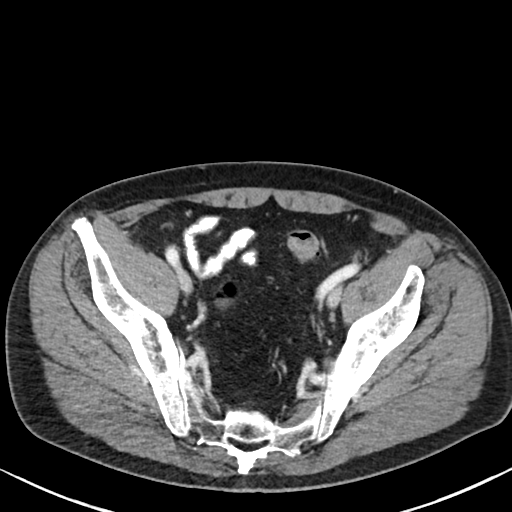
[im 41/101  soft-tissue]
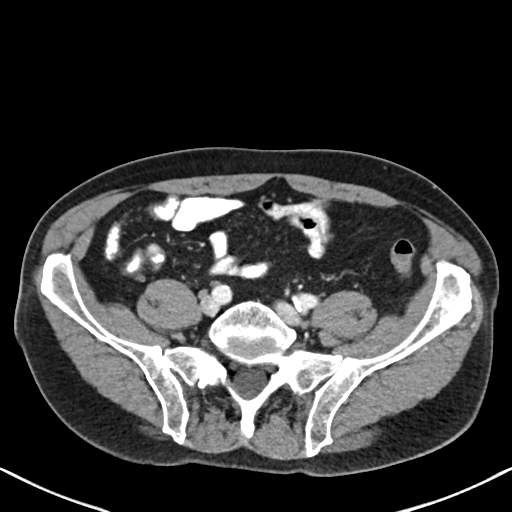
[im 46/101  soft-tissue]
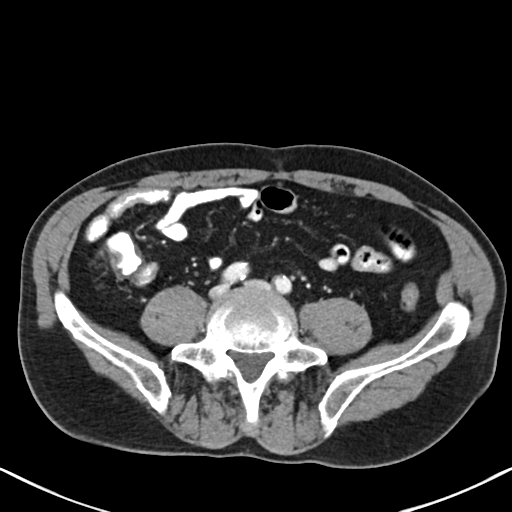
[im 56/101  soft-tissue]
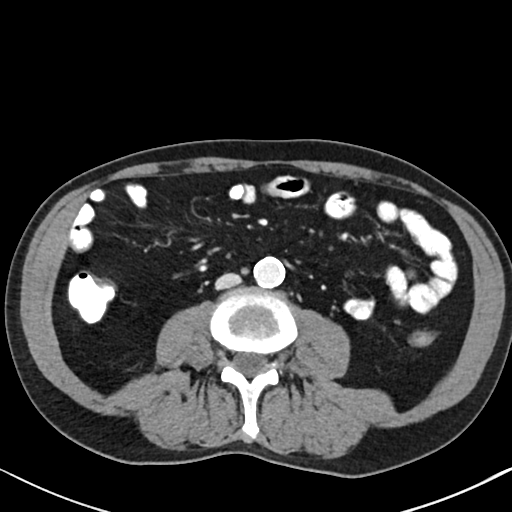
[im 61/101  soft-tissue]
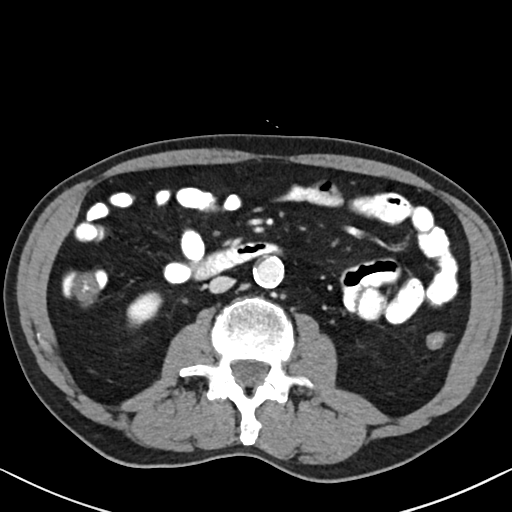
[im 71/101  soft-tissue]
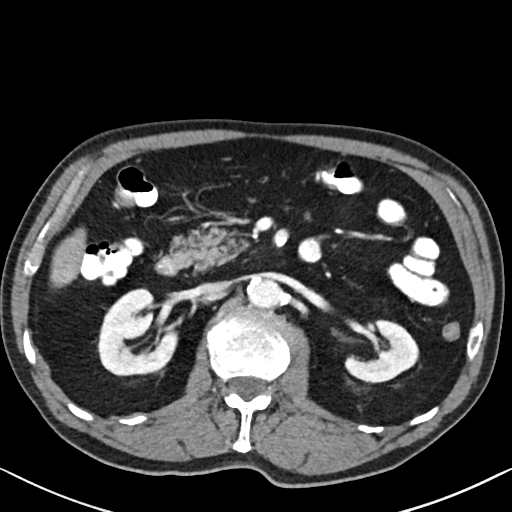
[im 71/101  bone]
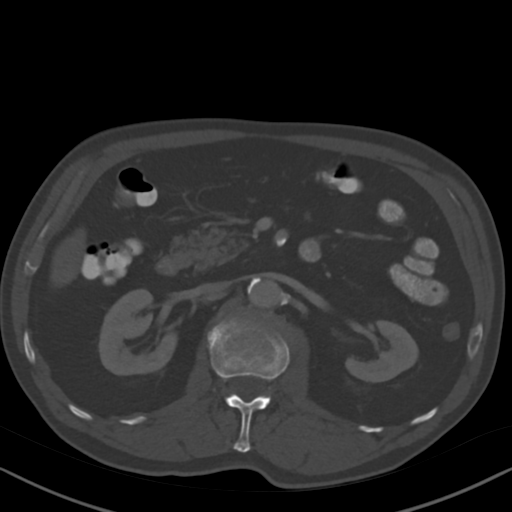
[im 81/101  soft-tissue]
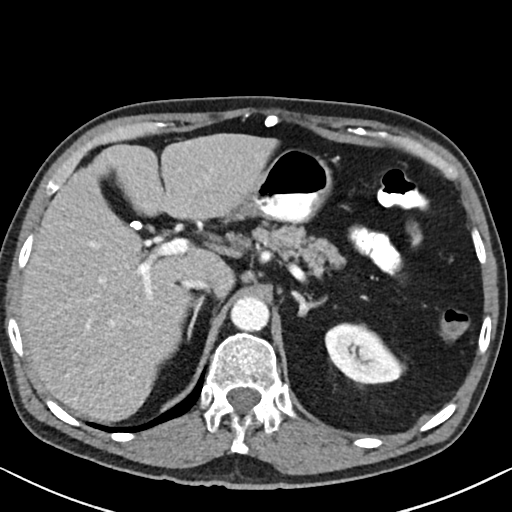
[im 86/101  soft-tissue]
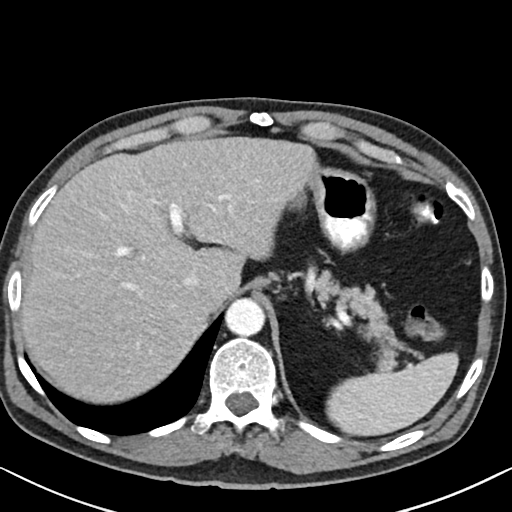
[im 96/101  soft-tissue]
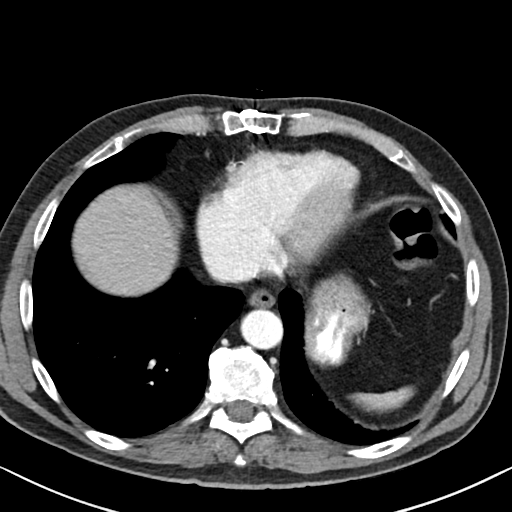

[Series 6: abd pelvis 2.00 br40 s3 cor · coronal · 0.68mm/px · 3 of 132 slices shown]
[im 44/132  soft-tissue]
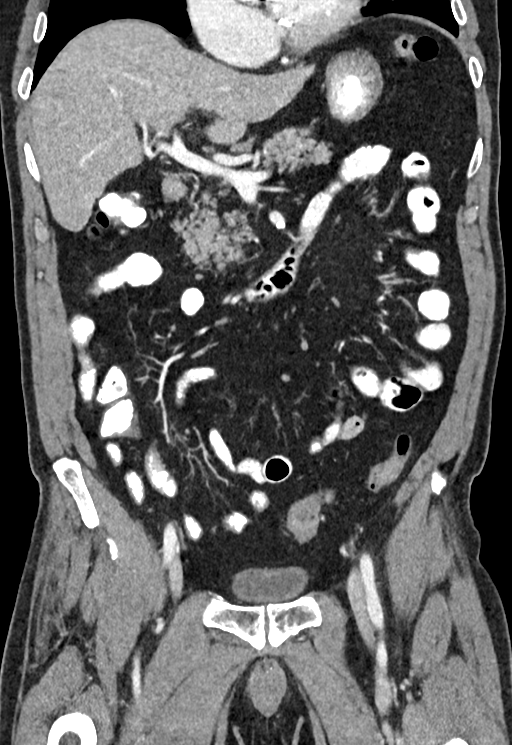
[im 59/132  soft-tissue]
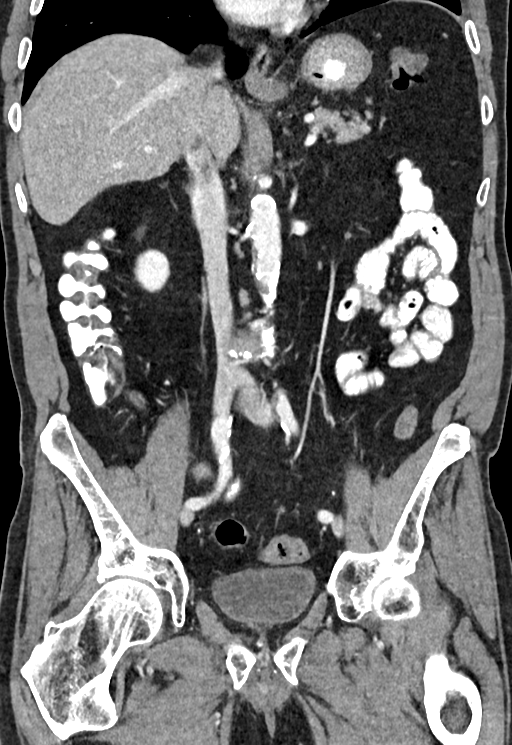
[im 73/132  soft-tissue]
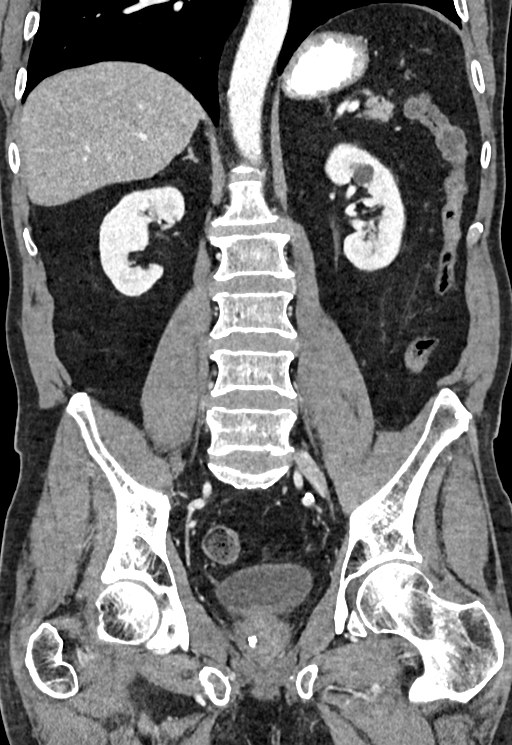

[15 of 46 positions shown; findings below may reference images not displayed]

FINDINGS: Lower chest: Paraseptal pulmonary emphysema. Incompletely imaged
prosthetic aortic valve. The heart is normal in size. No pericardial
effusion. Unremarkable visualized distal thoracic esophagus.

Hepatobiliary: No focal liver abnormality is seen. Status post
cholecystectomy. No biliary dilatation.

Pancreas: Unremarkable. No pancreatic ductal dilatation or
surrounding inflammatory changes.

Spleen: Normal in size without focal abnormality.

Adrenals/Urinary Tract: Normal adrenal glands. No enhancing renal
lesion, hydronephrosis or nephrolithiasis. Circumscribed
low-attenuation lesions present bilaterally. The largest measures
1.6 cm and is consistent with a simple cyst. The smaller lesions are
too small to characterize but also statistically highly likely
benign cysts.

Stomach/Bowel: Colonic diverticular disease without CT evidence of
active inflammation. No evidence of obstruction or focal bowel wall
thickening. Normal appendix in the right lower quadrant. The
terminal ileum is unremarkable.

Vascular/Lymphatic: Atherosclerotic calcifications present
throughout the abdominal aorta. Probable moderate focal stenosis of
the origin of the superior mesenteric artery secondary to calcified
atherosclerotic plaque. The celiac artery is widely patent. The IMA
remains patent. No aneurysm or dissection.

Reproductive: Prostate is unremarkable.

Other: No abdominal wall hernia or abnormality. No abdominopelvic
ascites.

Musculoskeletal: No acute fracture or aggressive appearing lytic or
blastic osseous lesion. Incompletely imaged healed median
sternotomy.
IMPRESSION: 1. No acute abnormality within the abdomen or pelvis.
2. Calcified atherosclerotic plaque likely results in at least
moderate stenosis of the origin of the superior mesenteric artery.
The celiac artery remains widely patent as does the IMA.
3. Colonic diverticular disease without CT evidence of active
inflammation.
4. Surgical changes of prior cholecystectomy.
5.  Aortic Atherosclerosis (G6UD5-170.0)
6.  Emphysema (G6UD5-ZVQ.1).

## 2020-01-28 DIAGNOSIS — Z7901 Long term (current) use of anticoagulants: Secondary | ICD-10-CM | POA: Diagnosis not present

## 2020-02-22 ENCOUNTER — Telehealth: Payer: Self-pay | Admitting: Pharmacist

## 2020-02-22 MED ORDER — SPIRONOLACTONE 25 MG PO TABS: 25.0000 mg | ORAL_TABLET | Freq: Every day | ORAL | 3 refills | Status: DC

## 2020-02-22 NOTE — Telephone Encounter (Signed)
Pt called to inquire if his home BP readings are running too low.   Home readings: 114/57, 120/51, 115/55, 110/45, 124/56, 124/56, 120/55, 107/52, 112/57, 106/48, 105/55, 124/56, 120/55. HR 60-70.  He has been feeling more fatigued over the last month or so. States he can only work for about 30 minutes on the cars in his garage before feeling tired and needing to rest, used to work up to 12 hours at a time. Balance is worse but has not had any falls.   Confirmed medication regimen with pt, correlates with our med list. Will decrease spironolactone from 50mg  to 25mg  daily. New rx sent in. Encouraged pt to continue monitoring BP and to call us with an update in 1-2 weeks. He verbalized understanding.

## 2020-02-27 ENCOUNTER — Emergency Department (HOSPITAL_COMMUNITY): Payer: Medicare Other

## 2020-02-27 ENCOUNTER — Inpatient Hospital Stay (HOSPITAL_COMMUNITY)
Admission: AD | Admit: 2020-02-27 | Discharge: 2020-03-01 | DRG: 872 | Disposition: A | Discharge status: Home / Self Care | Payer: Medicare Other | Attending: Internal Medicine | Admitting: Internal Medicine

## 2020-02-27 ENCOUNTER — Encounter (HOSPITAL_COMMUNITY): Payer: Self-pay | Admitting: Emergency Medicine

## 2020-02-27 ENCOUNTER — Other Ambulatory Visit: Payer: Self-pay

## 2020-02-27 DIAGNOSIS — K649 Unspecified hemorrhoids: Secondary | ICD-10-CM | POA: Diagnosis present

## 2020-02-27 DIAGNOSIS — Z79890 Hormone replacement therapy (postmenopausal): Secondary | ICD-10-CM

## 2020-02-27 DIAGNOSIS — D631 Anemia in chronic kidney disease: Secondary | ICD-10-CM | POA: Diagnosis present

## 2020-02-27 DIAGNOSIS — I4891 Unspecified atrial fibrillation: Secondary | ICD-10-CM | POA: Diagnosis present

## 2020-02-27 DIAGNOSIS — Z7901 Long term (current) use of anticoagulants: Secondary | ICD-10-CM

## 2020-02-27 DIAGNOSIS — Z66 Do not resuscitate: Secondary | ICD-10-CM | POA: Diagnosis present

## 2020-02-27 DIAGNOSIS — Z87440 Personal history of urinary (tract) infections: Secondary | ICD-10-CM | POA: Diagnosis not present

## 2020-02-27 DIAGNOSIS — Z20822 Contact with and (suspected) exposure to covid-19: Secondary | ICD-10-CM | POA: Diagnosis present

## 2020-02-27 DIAGNOSIS — E039 Hypothyroidism, unspecified: Secondary | ICD-10-CM

## 2020-02-27 DIAGNOSIS — Z888 Allergy status to other drugs, medicaments and biological substances status: Secondary | ICD-10-CM | POA: Diagnosis not present

## 2020-02-27 DIAGNOSIS — I483 Typical atrial flutter: Secondary | ICD-10-CM | POA: Diagnosis present

## 2020-02-27 DIAGNOSIS — N1832 Chronic kidney disease, stage 3b: Secondary | ICD-10-CM | POA: Diagnosis present

## 2020-02-27 DIAGNOSIS — I25810 Atherosclerosis of coronary artery bypass graft(s) without angina pectoris: Secondary | ICD-10-CM | POA: Diagnosis present

## 2020-02-27 DIAGNOSIS — N179 Acute kidney failure, unspecified: Secondary | ICD-10-CM | POA: Diagnosis present

## 2020-02-27 DIAGNOSIS — D649 Anemia, unspecified: Secondary | ICD-10-CM | POA: Diagnosis present

## 2020-02-27 DIAGNOSIS — Z87891 Personal history of nicotine dependence: Secondary | ICD-10-CM

## 2020-02-27 DIAGNOSIS — I129 Hypertensive chronic kidney disease with stage 1 through stage 4 chronic kidney disease, or unspecified chronic kidney disease: Secondary | ICD-10-CM | POA: Diagnosis present

## 2020-02-27 DIAGNOSIS — Z8249 Family history of ischemic heart disease and other diseases of the circulatory system: Secondary | ICD-10-CM | POA: Diagnosis not present

## 2020-02-27 DIAGNOSIS — Z885 Allergy status to narcotic agent status: Secondary | ICD-10-CM

## 2020-02-27 DIAGNOSIS — I2510 Atherosclerotic heart disease of native coronary artery without angina pectoris: Secondary | ICD-10-CM | POA: Diagnosis present

## 2020-02-27 DIAGNOSIS — I4892 Unspecified atrial flutter: Secondary | ICD-10-CM | POA: Diagnosis not present

## 2020-02-27 DIAGNOSIS — I10 Essential (primary) hypertension: Secondary | ICD-10-CM | POA: Diagnosis not present

## 2020-02-27 DIAGNOSIS — R195 Other fecal abnormalities: Secondary | ICD-10-CM | POA: Diagnosis present

## 2020-02-27 DIAGNOSIS — I25700 Atherosclerosis of coronary artery bypass graft(s), unspecified, with unstable angina pectoris: Secondary | ICD-10-CM

## 2020-02-27 DIAGNOSIS — H3530 Unspecified macular degeneration: Secondary | ICD-10-CM | POA: Diagnosis present

## 2020-02-27 DIAGNOSIS — Z79899 Other long term (current) drug therapy: Secondary | ICD-10-CM

## 2020-02-27 DIAGNOSIS — A419 Sepsis, unspecified organism: Secondary | ICD-10-CM | POA: Diagnosis not present

## 2020-02-27 DIAGNOSIS — R6520 Severe sepsis without septic shock: Secondary | ICD-10-CM | POA: Diagnosis present

## 2020-02-27 DIAGNOSIS — N390 Urinary tract infection, site not specified: Secondary | ICD-10-CM | POA: Diagnosis present

## 2020-02-27 DIAGNOSIS — Z952 Presence of prosthetic heart valve: Secondary | ICD-10-CM

## 2020-02-27 DIAGNOSIS — A4151 Sepsis due to Escherichia coli [E. coli]: Secondary | ICD-10-CM | POA: Diagnosis present

## 2020-02-27 LAB — CBC WITH DIFFERENTIAL/PLATELET
Abs Immature Granulocytes: 0.05 10*3/uL (ref 0.00–0.07)
Basophils Absolute: 0 10*3/uL (ref 0.0–0.1)
Basophils Relative: 0 %
Eosinophils Absolute: 0.1 10*3/uL (ref 0.0–0.5)
Eosinophils Relative: 0 %
HCT: 29.9 % — ABNORMAL LOW (ref 39.0–52.0)
Hemoglobin: 9.7 g/dL — ABNORMAL LOW (ref 13.0–17.0)
Immature Granulocytes: 0 %
Lymphocytes Relative: 9 %
Lymphs Abs: 1.2 10*3/uL (ref 0.7–4.0)
MCH: 34.6 pg — ABNORMAL HIGH (ref 26.0–34.0)
MCHC: 32.4 g/dL (ref 30.0–36.0)
MCV: 106.8 fL — ABNORMAL HIGH (ref 80.0–100.0)
Monocytes Absolute: 1.7 10*3/uL — ABNORMAL HIGH (ref 0.1–1.0)
Monocytes Relative: 14 %
Neutro Abs: 9.3 10*3/uL — ABNORMAL HIGH (ref 1.7–7.7)
Neutrophils Relative %: 77 %
Platelets: 141 10*3/uL — ABNORMAL LOW (ref 150–400)
RBC: 2.8 MIL/uL — ABNORMAL LOW (ref 4.22–5.81)
RDW: 13.2 % (ref 11.5–15.5)
WBC: 12.3 10*3/uL — ABNORMAL HIGH (ref 4.0–10.5)
nRBC: 0 % (ref 0.0–0.2)

## 2020-02-27 LAB — POC OCCULT BLOOD, ED: Fecal Occult Bld: NEGATIVE

## 2020-02-27 LAB — URINALYSIS, ROUTINE W REFLEX MICROSCOPIC
Bilirubin Urine: NEGATIVE
Glucose, UA: NEGATIVE mg/dL
Hgb urine dipstick: NEGATIVE
Ketones, ur: NEGATIVE mg/dL
Nitrite: POSITIVE — AB
Protein, ur: NEGATIVE mg/dL
Specific Gravity, Urine: 1.017 (ref 1.005–1.030)
WBC, UA: >50 WBC/hpf — ABNORMAL HIGH (ref 0–5)
pH: 5 (ref 5.0–8.0)

## 2020-02-27 LAB — COMPREHENSIVE METABOLIC PANEL
ALT: 23 U/L (ref 0–44)
AST: 56 U/L — ABNORMAL HIGH (ref 15–41)
Albumin: 3.3 g/dL — ABNORMAL LOW (ref 3.5–5.0)
Alkaline Phosphatase: 63 U/L (ref 38–126)
Anion gap: 10 (ref 5–15)
BUN: 31 mg/dL — ABNORMAL HIGH (ref 8–23)
CO2: 18 mmol/L — ABNORMAL LOW (ref 22–32)
Calcium: 8.9 mg/dL (ref 8.9–10.3)
Chloride: 108 mmol/L (ref 98–111)
Creatinine, Ser: 1.94 mg/dL — ABNORMAL HIGH (ref 0.61–1.24)
GFR calc Af Amer: 36 mL/min — ABNORMAL LOW (ref >60)
GFR calc non Af Amer: 31 mL/min — ABNORMAL LOW (ref >60)
Glucose, Bld: 152 mg/dL — ABNORMAL HIGH (ref 70–99)
Potassium: 5.1 mmol/L (ref 3.5–5.1)
Sodium: 136 mmol/L (ref 135–145)
Total Bilirubin: 1.9 mg/dL — ABNORMAL HIGH (ref 0.3–1.2)
Total Protein: 6.6 g/dL (ref 6.5–8.1)

## 2020-02-27 LAB — LACTIC ACID, PLASMA: Lactic Acid, Venous: 2.3 mmol/L (ref 0.5–1.9)

## 2020-02-27 LAB — APTT: aPTT: 50 seconds — ABNORMAL HIGH (ref 24–36)

## 2020-02-27 LAB — PROTIME-INR
INR: 2.6 — ABNORMAL HIGH (ref 0.8–1.2)
Prothrombin Time: 26.9 seconds — ABNORMAL HIGH (ref 11.4–15.2)

## 2020-02-27 LAB — SARS CORONAVIRUS 2 BY RT PCR (HOSPITAL ORDER, PERFORMED IN ~~LOC~~ HOSPITAL LAB): SARS Coronavirus 2: NEGATIVE

## 2020-02-27 MED ORDER — LACTATED RINGERS IV BOLUS (SEPSIS): 1000.0000 mL | Freq: Once | INTRAVENOUS | Status: DC

## 2020-02-27 MED ORDER — SODIUM CHLORIDE 0.9 % IV SOLN
1.0000 g | INTRAVENOUS | Status: DC
  Administered 2020-02-27: 1 g via INTRAVENOUS
  Filled 2020-02-27: qty 10

## 2020-02-27 MED ORDER — LACTATED RINGERS IV BOLUS (SEPSIS): 500.0000 mL | Freq: Once | INTRAVENOUS | Status: DC

## 2020-02-27 MED ORDER — LACTATED RINGERS IV BOLUS (SEPSIS)
1000.0000 mL | Freq: Once | INTRAVENOUS | Status: AC
  Administered 2020-02-27: 1000 mL via INTRAVENOUS

## 2020-02-27 MED ORDER — SODIUM CHLORIDE 0.9 % IV SOLN
1.0000 g | INTRAVENOUS | Status: DC
  Administered 2020-02-28 – 2020-02-29 (×2): 1 g via INTRAVENOUS
  Filled 2020-02-27 (×2): qty 10

## 2020-02-27 MED ORDER — LACTATED RINGERS IV BOLUS (SEPSIS)
250.0000 mL | Freq: Once | INTRAVENOUS | Status: AC
  Administered 2020-02-27: 250 mL via INTRAVENOUS

## 2020-02-27 NOTE — ED Provider Notes (Signed)
[facility] EMERGENCY DEPARTMENT Provider Note   CSN: [id number] Arrival date & time: 02/27/20  2001     History Chief Complaint  Patient presents with  . Fever    Jason Santana is a 86 y.o. male.  Jason Santana is a 86 y.o. male with a history of A. fib on chronic anticoagulation, aortic valve replacement, CAD, hypothyroidism, hypertension, who presents to the emergency department via EMS for evaluation of fever.  Patient reports he had a fever of about 102 at home today, with EMS temperature of 103 documented.  He states that today he started to feel unwell, he had noticed some urinary frequency beginning yesterday that became significantly worse today after lunch.  He has not had dysuria, but feels that he has to go to the bathroom frequently, but then does not empty his bladder.  He has had some suprapubic pressure but otherwise denies associated abdominal pain.  No nausea or vomiting.  No flank pain.  No hematuria.  Patient states he had similar symptoms a few years ago and was diagnosed with a UTI.  He has not had any cough, chest pain or shortness of breath.  Denies any known sick contacts.  Had his Covid vaccine several months ago.  Patient was given Tylenol and 300 cc of IV fluids in route.        Past Medical History:  Diagnosis Date  . Anticoagulation adequate 05/23/2019  . Atrial fibrillation ([place])   . CAD (coronary artery disease) of artery bypass graft 2000   2000 with multiple bypasses   . Chronic anticoagulation    Coumadin therapy for mechanical aortic valve. Postoperative atrial fibrillation.   . Essential hypertension   . H/O mechanical aortic valve replacement 2000   [employer] AVR  . Hypothyroidism   . Premature ventricular contractions   . S/P ablation of atrial fibrillation 05/22/19 05/23/2019  . Typical atrial flutter ([place]) 05/23/2019    Patient Active Problem List   Diagnosis Date Noted  . Symptomatic anemia 02/27/2020  . Thoracic aortic  aneurysm ([place]) 11/08/2019  . Anticoagulation adequate 05/23/2019  . Typical atrial flutter ([place]) 05/23/2019  . S/P ablation of atrial fibrillation 05/22/19 05/23/2019  . Atrial fibrillation ([place]) 05/22/2019  . Chest pain 08/11/2018  . Syncope 01/04/2018  . Coronary artery disease involving native coronary artery of native heart with unstable angina pectoris ([place]) 01/04/2018  . GI bleed 01/03/2018  . Macular degeneration of left eye 05/26/2017  . Hypothyroidism 05/26/2017  . Hyponatremia 05/25/2017  . Sepsis ([place]) 05/25/2017  . Muscle weakness 12/13/2014  . Paroxysmal atrial flutter ([place]) 11/25/2013  . Essential hypertension 11/25/2013  . H/O mechanical aortic valve replacement 11/25/2013  . CAD (coronary artery disease) of artery bypass graft 11/25/2013  . Chronic anticoagulation 11/25/2013  . Premature ventricular contractions 11/25/2013    Past Surgical History:  Procedure Laterality Date  . AORTIC VALVE REPLACEMENT (AVR)/CORONARY ARTERY BYPASS GRAFTING (CABG)  2000  . ATRIAL FIBRILLATION ABLATION N/A 05/22/2019   Procedure: ATRIAL FIBRILLATION ABLATION;  Surgeon: Constance Haw, MD;  Location: [facility] LAB;  Service: Cardiovascular;  Laterality: N/A;  . CARDIOVERSION N/A 02/24/2018   Procedure: CARDIOVERSION;  Surgeon: Fay Records, MD;  Location: [facility] ENDOSCOPY;  Service: Cardiovascular;  Laterality: N/A;  . CARDIOVERSION N/A 08/15/2018   Procedure: CARDIOVERSION;  Surgeon: Thayer Headings, MD;  Location: [facility];  Service: Cardiovascular;  Laterality: N/A;  . COLONOSCOPY WITH PROPOFOL N/A 01/08/2018   Procedure: COLONOSCOPY WITH  PROPOFOL;  Surgeon: Arta Silence, MD;  Location: [facility];  Service: Gastroenterology;  Laterality: N/A;  . CORONARY/GRAFT ANGIOGRAPHY N/A 08/14/2018   Procedure: CORONARY/GRAFT ANGIOGRAPHY;  Surgeon: [name], MD;  Location: [facility];  Service: Cardiovascular;  Laterality: N/A;  . ESOPHAGOGASTRODUODENOSCOPY (EGD)  WITH PROPOFOL N/A 01/06/2018   Procedure: ESOPHAGOGASTRODUODENOSCOPY (EGD) WITH PROPOFOL;  Surgeon: Otis Brace, MD;  Location: [facility] ENDOSCOPY;  Service: Gastroenterology;  Laterality: N/A;       Family History  Problem Relation Age of Onset  . Hypertension Mother   . Heart attack Mother   . Hypertension Father     Social History   Tobacco Use  . Smoking status: Former Smoker    Types: Cigarettes  . Smokeless tobacco: Never Used  . Tobacco comment: quit 57 years ago  Vaping Use  . Vaping Use: Never used  Substance Use Topics  . Alcohol use: No  . Drug use: No    Home Medications Prior to Admission medications   Medication Sig Start Date End Date Taking? Authorizing Provider  Cholecalciferol (VITAMIN D) 2000 units tablet Take 2,000 Units by mouth daily.   Yes [provider]  diltiazem (CARDIZEM CD) 120 MG 24 hr capsule Take 1 capsule (120 mg total) by mouth daily. 12/07/19 03/06/20 Yes Camnitz, Will Hassell Done, MD  diltiazem (CARDIZEM) 30 MG tablet TAKE 1 TABLET AS NEEDED FOR PALPITATIONS OR HEART RATE >100 Patient taking differently: Take 30 mg by mouth See admin instructions. Take 30mg  as needed for palpitations or heart rate >100 10/05/19  Yes Camnitz, Will Hassell Done, MD  diphenhydramine-acetaminophen (TYLENOL PM) 25-500 MG TABS tablet Take 0.5 tablets by mouth at bedtime as needed (sleep).   Yes [provider]  ferrous sulfate 325 (65 FE) MG tablet Take 325 mg by mouth daily with supper.   Yes [provider]  furosemide (LASIX) 20 MG tablet Take 20 mg by mouth daily.   Yes [provider]  levothyroxine (SYNTHROID) 125 MCG tablet Take 125 mcg by mouth daily before breakfast.  11/21/18  Yes [provider]  Multiple Vitamins-Minerals (PRESERVISION AREDS 2 PO) Take 1 capsule by mouth 2 (two) times daily.   Yes [provider]  pantoprazole (PROTONIX) 40 MG tablet Take 40 mg by mouth 4 (four) times a week.   Yes [provider]  Polyethyl Glycol-Propyl Glycol (SYSTANE OP) Place 1 drop into both eyes daily as needed (dry eyes).   Yes [provider]  spironolactone (ALDACTONE) 25 MG tablet Take 1 tablet (25 mg total) by mouth daily. 02/22/20  Yes Camnitz, Will Hassell Done, MD  tobramycin (TOBREX) 0.3 % ophthalmic solution Place 1 drop into the left eye See admin instructions. Instill 1 drop into left eye 1 day before, the day of, and the day after eye injections administered at Dr's office   Yes [provider]  valsartan (DIOVAN) 320 MG tablet Take 1 tablet (320 mg total) by mouth daily. 09/10/19  Yes Camnitz, Will Hassell Done, MD  warfarin (COUMADIN) 7.5 MG tablet Take 5-7.5 mg by mouth See admin instructions. Per patient taking 5mg  on Sundays    Yes [provider]  sulfamethoxazole-trimethoprim (BACTRIM DS) 800-160 MG tablet  06/15/19   [provider]    Allergies    Carvedilol, Lopid [gemfibrozil], Monopril [fosinopril], Vicodin [hydrocodone-acetaminophen], Phenergan [promethazine hcl], Zocor [simvastatin], Benicar [olmesartan], Codeine, and Prilosec [omeprazole]  Review of Systems   Review of Systems  Constitutional: Positive for chills and fever.  HENT: Negative for congestion,  rhinorrhea and sore throat.   Respiratory: Negative for cough and shortness of breath.   Cardiovascular: Negative for chest pain.  Gastrointestinal: Negative for abdominal pain, constipation, diarrhea, nausea and vomiting.  Genitourinary: Positive for frequency. Negative for dysuria, flank pain, hematuria and testicular pain.  Musculoskeletal: Negative for arthralgias and myalgias.  Skin: Negative for color change and rash.  Neurological: Negative for dizziness, syncope and light-headedness.  All other systems reviewed and are negative.   Physical Exam Updated Vital Signs BP (!) 114/45 (BP Location: Right Arm)   Pulse 81   Temp 99.2 F (37.3 C) (Oral)   Resp 16   Ht 5\' 10"  (1.778 m)   Wt 76.7 kg    SpO2 96%   BMI 24.25 kg/m   Physical Exam Vitals and nursing note reviewed.  Constitutional:      General: He is not in acute distress.    Appearance: He is well-developed. He is not diaphoretic.  HENT:     Head: Normocephalic and atraumatic.  Eyes:     General:        Right eye: No discharge.        Left eye: No discharge.     Pupils: Pupils are equal, round, and reactive to light.  Cardiovascular:     Rate and Rhythm: Normal rate and regular rhythm.     Heart sounds: Normal heart sounds.  Pulmonary:     Effort: Pulmonary effort is normal. No respiratory distress.     Breath sounds: Normal breath sounds. No wheezing or rales.     Comments: Respirations equal and unlabored, patient able to speak in full sentences, lungs clear to auscultation bilaterally Abdominal:     General: Bowel sounds are normal. There is no distension.     Palpations: Abdomen is soft. There is no mass.     Tenderness: There is abdominal tenderness. There is no guarding.     Comments: Abdomen is soft, nondistended, bowel sounds present throughout, there is some mild tenderness over the suprapubic region, all other quadrants nontender to palpation, no CVA tenderness laterally.  Musculoskeletal:        General: No deformity.     Cervical back: Neck supple.  Skin:    General: Skin is warm and dry.     Capillary Refill: Capillary refill takes less than 2 seconds.  Neurological:     Mental Status: He is alert.     Coordination: Coordination normal.     Comments: Speech is clear, able to follow commands Moves extremities without ataxia, coordination intact  Psychiatric:        Mood and Affect: Mood normal.        Behavior: Behavior normal.     ED Results / Procedures / Treatments   Labs (all labs ordered are listed, but only abnormal results are displayed) Labs Reviewed  LACTIC ACID, PLASMA - Abnormal; Notable for the following components:      Result Value   Lactic Acid, Venous 2.3 (*)    All other  components within normal limits  COMPREHENSIVE METABOLIC PANEL - Abnormal; Notable for the following components:   CO2 18 (*)    Glucose, Bld 152 (*)    BUN 31 (*)    Creatinine, Ser 1.94 (*)    Albumin 3.3 (*)    AST 56 (*)    Total Bilirubin 1.9 (*)    GFR calc non Af Amer 31 (*)    GFR calc Af Amer 36 (*)    All  other components within normal limits  CBC WITH DIFFERENTIAL/PLATELET - Abnormal; Notable for the following components:   WBC 12.3 (*)    RBC 2.80 (*)    Hemoglobin 9.7 (*)    HCT 29.9 (*)    MCV 106.8 (*)    MCH 34.6 (*)    Platelets 141 (*)    Neutro Abs 9.3 (*)    Monocytes Absolute 1.7 (*)    All other components within normal limits  APTT - Abnormal; Notable for the following components:   aPTT 50 (*)    All other components within normal limits  PROTIME-INR - Abnormal; Notable for the following components:   Prothrombin Time 26.9 (*)    INR 2.6 (*)    All other components within normal limits  URINALYSIS, ROUTINE W REFLEX MICROSCOPIC - Abnormal; Notable for the following components:   APPearance HAZY (*)    Nitrite POSITIVE (*)    Leukocytes,Ua LARGE (*)    WBC, UA >50 (*)    Bacteria, UA FEW (*)    All other components within normal limits  SARS CORONAVIRUS 2 BY RT PCR (HOSPITAL ORDER, [facility])  CULTURE, BLOOD (ROUTINE X 2)  CULTURE, BLOOD (ROUTINE X 2)  URINE CULTURE  LACTIC ACID, PLASMA  VITAMIN B12  FOLATE  IRON AND TIBC  FERRITIN  RETICULOCYTES  LACTIC ACID, PLASMA  LACTIC ACID, PLASMA  PROCALCITONIN  POC OCCULT BLOOD, ED  TYPE AND SCREEN    EKG EKG Interpretation  Date/Time:  Saturday February 27 2020 20:20:54 EDT Ventricular Rate:  82 PR Interval:    QRS Duration: 102 QT Interval:  405 QTC Calculation: 473 R Axis:   -60 Text Interpretation: Sinus rhythm Left anterior fascicular block Abnormal R-wave progression, early transition LVH with secondary repolarization abnormality ST depression V1-V3, suggest  recording posterior leads Baseline wander in lead(s) II III aVL aVF limits interpretation Confirmed by Virgel Manifold [phone]) on 02/27/2020 11:39:56 PM   Radiology DG Chest Port 1 View  Result Date: 02/27/2020 CLINICAL DATA:  Sepsis EXAM: PORTABLE CHEST 1 VIEW COMPARISON:  04/10/2019 FINDINGS: Single frontal view of the chest demonstrates a stable cardiac silhouette with postsurgical changes from median sternotomy. No airspace disease, effusion, or pneumothorax. No acute bony abnormalities. IMPRESSION: 1. No acute intrathoracic process. Electronically Signed   By: Randa Ngo M.D.   On: 02/27/2020 20:51    Procedures .Critical Care Performed by: Jacqlyn Larsen, PA-C Authorized by: Jacqlyn Larsen, PA-C   Critical care provider statement:    Critical care time (minutes):  45   Critical care was necessary to treat or prevent imminent or life-threatening deterioration of the following conditions:  Sepsis   Critical care was time spent personally by me on the following activities:  Discussions with consultants, evaluation of patient's response to treatment, examination of patient, ordering and performing treatments and interventions, ordering and review of laboratory studies, ordering and review of radiographic studies, pulse oximetry, re-evaluation of patient's condition, obtaining history from patient or surrogate and review of old charts   (including critical care time)  Medications Ordered in ED Medications  cefTRIAXone (ROCEPHIN) 1 g in sodium chloride 0.9 % 100 mL IVPB (has no administration in time range)  lactated ringers bolus 1,000 mL (0 mLs Intravenous Stopped 02/27/20 2151)    And  lactated ringers bolus 1,000 mL (0 mLs Intravenous Stopped 02/27/20 2140)    And  lactated ringers bolus 250 mL (0 mLs Intravenous Stopped 02/27/20 2235)  ED Course  I have reviewed the triage vital signs and the nursing notes.  Pertinent labs & imaging results that were available during my care  of the patient were reviewed by me and considered in my medical decision making (see chart for details).    MDM Rules/Calculators/A&P                         86 year old male presents with fever and urinary frequency.  Temp of 103 with EMS, improved to 99.2 after Tylenol, on arrival though patient does have some soft blood pressures initially 114/45, and then 92/42, all other vitals stable, code sepsis initiated with likely urinary source, 30 cc/kg fluid bolus ordered and patient started on Rocephin for likely UTI.  Mild suprapubic tenderness but no flank tenderness patient has not had nausea or vomiting and is nontoxic-appearing, doubt pyelonephritis or need for abdominal imaging.  I have independently ordered, reviewed and interpreted all labs and imaging: CBC: Mild leukocytosis of 12.3, hemoglobin is 9.7 today, a significant drop from 13 9 months ago CMP: CO2 18, likely in the setting of elevated lactic acid, no other significant electrolyte derangements, creatinine is slightly elevated today at 1.94, usually between 1.2-1.4.,  Normal liver function. Lactic acid: 2.3 Urinalysis: consistent with infection with positive nitrites, large leukocytes, greater than 50 WBCs and few bacteria Coags: INR therapeutic on Coumadin EKG: Unremarkable CXR: No active cardiopulmonary disease  Given drop in hemoglobin I discussed further with patient, he has not noticed any recent dark stools or bright red blood in his stool, does state that sometimes when he strains he has some bleeding from hemorrhoids, Hemoccult completed today which was negative.  Given UTI with initial soft blood pressures, they have improved with fluids and remained stable, as well as slight bump in creatinine and low hemoglobin will admit for sepsis with urinary source.  Case discussed with Dr. Roel Cluck with [facility] hospitalist who will see and admit the patient.  Final Clinical Impression(s) / ED Diagnoses Final diagnoses:  Sepsis  secondary to UTI [facility])    Rx / DC Orders ED Discharge Orders    None       Janet Berlin 02/28/20 0142    Virgel Manifold, MD 02/28/20 1525

## 2020-02-27 NOTE — ED Triage Notes (Signed)
Pt arrived from home via [facility] c/o fever of 101 at home. Pt also experiencing frequent urination. Per pt last time he felt this way he had a UTI.

## 2020-02-27 NOTE — H&P (Addendum)
Jason Santana TEL:[id number] DOB: June 22, 1932 DOA: 02/27/2020    PCP: Marden Noble, MD   Outpatient Specialists:   CARDS:  Dr.Ross   GI Dr.  Levora Angel,   Patient arrived to ER on 02/27/20 at 2001 Referred by Attending Raeford Razor, MD   Patient coming from: home Lives  With family    Chief Complaint:   Chief Complaint  Patient presents with  . Fever    HPI: Jason Santana is a 86 y.o. male with medical history significant of hypertension, CAD status post CABG, atrial fibrillation status post ablation in September 2020, mechanical [place] Jude AVR on Coumadin, GI bleed in 2019, hypothyroidism    Presented with fever and frequent urination fever up to 101 in the similar presentation the past 5 UTIs Denies any melena, no blood in stool He has hemorrhoids  Reports fever today up to 103 Patient has difficult to manage hypertension followed by cardiology  Infectious risk factors:  Reports  severe fatigue     Has   been vaccinated against COVID    Initial COVID TEST  NEGATIVE   Lab Results  Component Value Date   SARSCOV2NAA NEGATIVE 02/27/2020   SARSCOV2NAA NOT DETECTED 05/19/2019    Regarding pertinent Chronic problems:    Hyperlipidemia -  Not on statins Lipid Panel     Component Value Date/Time   CHOL 130 04/15/2019 0905   TRIG 157 (H) 04/15/2019 0905   HDL 30 (L) 04/15/2019 0905   CHOLHDL 4.3 04/15/2019 0905   CHOLHDL 7.7 08/12/2018 0225   VLDL 48 (H) 08/12/2018 0225   LDLCALC 69 04/15/2019 0905   LABVLDL 31 04/15/2019 0905   Status post mechanical aortic valve replacement on Coumadin  History of hypothyroidism on Synthroid    HTN on valsartan, diltiazem, furosemide, spironolactone could not tolerate beta-blockers     CAD  - On Aspirin, statin,                  -  followed by cardiology                - last cardiac cath 2000 status post CABG x3      A. Fib -  CHA2DS2/VAS  4    current  on anticoagulation with  Coumadin           -  Rate control:   Currently controlled with  Diltiazem,    CKD stage III - baseline Cr 1.6 Estimated Creatinine Clearance: 28.7 mL/min (A) (by C-G formula based on SCr of 1.94 mg/dL (H)).  Lab Results  Component Value Date   CREATININE 1.94 (H) 02/27/2020   CREATININE 1.41 (H) 11/19/2019   CREATININE 1.59 (H) 11/12/2019     While in ER: Noted to have leukocytosis hemoglobin down to 9.7 from baseline around 13. INR 2.6 UA worrisome for UTI Patient was treated for sepsis started on ceftriaxone given IV fluids  Hospitalist was called for admission for Sepsis  The following Work up has been ordered so far:  Orders Placed This Encounter  Procedures  . Blood Culture (routine x 2)  . Urine culture  . SARS Coronavirus 2 by RT PCR (hospital order, performed in [facility] hospital lab) Nasopharyngeal Nasopharyngeal Swab  . DG Chest Port 1 View  . Lactic acid, plasma  . Comprehensive metabolic panel  . CBC WITH DIFFERENTIAL  . APTT  . Protime-INR  . Urinalysis, Routine w reflex microscopic  . Diet NPO time specified  . Cardiac monitoring  .  Refer to Sidebar Report: Sepsis Sidebar ED/IP  . Document vital signs within 1-hour of fluid bolus completion and notify provider of bolus completion  . Document height and weight  . Insert peripheral IV x 2  . Initiate Carrier Fluid Protocol  . Initiate Code Sepsis ([facility] [phone])  When activated, this will prioritize pharmacy, lab, and radiology services for this patient for STAT collections and interventions.  . pharmacy consult  . Consult to hospitalist  ALL PATIENTS BEING ADMITTED/HAVING PROCEDURES NEED COVID-19 SCREENING  . Pulse oximetry, continuous  . POC occult blood, ED  . EKG 12-Lead  . ED EKG 12-Lead    Following Medications were ordered in ER: Medications  cefTRIAXone (ROCEPHIN) 1 g in sodium chloride 0.9 % 100 mL IVPB (0 g Intravenous Stopped 02/27/20 2144)  lactated ringers bolus 1,000 mL (0 mLs Intravenous Stopped 02/27/20 2151)     And  lactated ringers bolus 1,000 mL (0 mLs Intravenous Stopped 02/27/20 2140)    And  lactated ringers bolus 250 mL (0 mLs Intravenous Stopped 02/27/20 2235)        Consult Orders  (From admission, onward)         Start     Ordered   02/27/20 2305  Consult to hospitalist  ALL PATIENTS BEING ADMITTED/HAVING PROCEDURES NEED COVID-19 SCREENING  Once       Comments: ALL PATIENTS BEING ADMITTED/HAVING PROCEDURES NEED COVID-19 SCREENING  Provider:  (Not yet assigned)  Question Answer Comment  Place call to: [facility]   Reason for Consult Admit      02/27/20 2304         Significant initial  Findings: Abnormal Labs Reviewed  LACTIC ACID, PLASMA - Abnormal; Notable for the following components:      Result Value   Lactic Acid, Venous 2.3 (*)    All other components within normal limits  COMPREHENSIVE METABOLIC PANEL - Abnormal; Notable for the following components:   CO2 18 (*)    Glucose, Bld 152 (*)    BUN 31 (*)    Creatinine, Ser 1.94 (*)    Albumin 3.3 (*)    AST 56 (*)    Total Bilirubin 1.9 (*)    GFR calc non Af Amer 31 (*)    GFR calc Af Amer 36 (*)    All other components within normal limits  CBC WITH DIFFERENTIAL/PLATELET - Abnormal; Notable for the following components:   WBC 12.3 (*)    RBC 2.80 (*)    Hemoglobin 9.7 (*)    HCT 29.9 (*)    MCV 106.8 (*)    MCH 34.6 (*)    Platelets 141 (*)    Neutro Abs 9.3 (*)    Monocytes Absolute 1.7 (*)    All other components within normal limits  APTT - Abnormal; Notable for the following components:   aPTT 50 (*)    All other components within normal limits  PROTIME-INR - Abnormal; Notable for the following components:   Prothrombin Time 26.9 (*)    INR 2.6 (*)    All other components within normal limits  URINALYSIS, ROUTINE W REFLEX MICROSCOPIC - Abnormal; Notable for the following components:   APPearance HAZY (*)    Nitrite POSITIVE (*)    Leukocytes,Ua LARGE (*)    WBC, UA >50 (*)    Bacteria,  UA FEW (*)    All other components within normal limits   Otherwise labs showing:    Recent Labs  Lab 02/27/20 2105  NA 136  K 5.1  CO2 18*  GLUCOSE 152*  BUN 31*  CREATININE 1.94*  CALCIUM 8.9    Cr  Up from baseline see below Lab Results  Component Value Date   CREATININE 1.94 (H) 02/27/2020   CREATININE 1.41 (H) 11/19/2019   CREATININE 1.59 (H) 11/12/2019    Recent Labs  Lab 02/27/20 2105  AST 56*  ALT 23  ALKPHOS 63  BILITOT 1.9*  PROT 6.6  ALBUMIN 3.3*   Lab Results  Component Value Date   CALCIUM 8.9 02/27/2020   PHOS 3.0 01/08/2018   WBC     Component Value Date/Time   WBC 12.3 (H) 02/27/2020 2105   ANC    Component Value Date/Time   NEUTROABS 9.3 (H) 02/27/2020 2105   ALC No components found for: LYMPHAB  Plt: Lab Results  Component Value Date   PLT 141 (L) 02/27/2020   Lactic Acid, Venous    Component Value Date/Time   LATICACIDVEN 2.3 ([place]) 02/27/2020 2105    Procalcitonin  Ordered   COVID-19 Labs  No results for input(s): DDIMER, FERRITIN, LDH, CRP in the last 72 hours.  Lab Results  Component Value Date   [place] NEGATIVE 02/27/2020   SARSCOV2NAA NOT DETECTED 05/19/2019   HG/HCT Down  from baseline see below    Component Value Date/Time   HGB 9.7 (L) 02/27/2020 2105   HCT 29.9 (L) 02/27/2020 2105      ECG: Ordered Personally reviewed by me showing: HR : 82 Rhythm:  SR, poor baseline, Left anterior fascicular block   nonspecific changes  QTC 473      UA    evidence of UTI      Urine analysis:    Component Value Date/Time   COLORURINE YELLOW 02/27/2020 2105   APPEARANCEUR HAZY (A) 02/27/2020 2105   LABSPEC 1.017 02/27/2020 2105   PHURINE 5.0 02/27/2020 2105   GLUCOSEU NEGATIVE 02/27/2020 2105   HGBUR NEGATIVE 02/27/2020 2105   BILIRUBINUR NEGATIVE 02/27/2020 2105   [place] 02/27/2020 2105   PROTEINUR NEGATIVE 02/27/2020 2105   NITRITE POSITIVE (A) 02/27/2020 2105   LEUKOCYTESUR LARGE (A)  02/27/2020 2105      Ordered   CXR -  NON acute     ED Triage Vitals  Enc Vitals Group     BP 02/27/20 2014 (!) 114/45     Pulse Rate 02/27/20 2014 81     Resp 02/27/20 2014 16     Temp 02/27/20 2014 99.2 F (37.3 C)     Temp Source 02/27/20 2014 Oral     SpO2 02/27/20 2007 97 %     Weight 02/27/20 2010 169 lb (76.7 kg)     Height 02/27/20 2010 '5\' 10"'$  (1.778 m)     Head Circumference --      Peak Flow --      Pain Score 02/27/20 2010 0     Pain Loc --      Pain Edu? --      Excl. in [place]? --   TMAX(24)@       Latest  Blood pressure (!) 110/59, pulse 72, temperature 99.2 F (37.3 C), temperature source Oral, resp. rate 18, height '5\' 10"'$  (1.778 m), weight 76.7 kg, SpO2 96 %.   Review of Systems:    Pertinent positives include:  Fevers, chills, fatigue,   Constitutional:  No weight loss, night sweats, weight loss  HEENT:  No headaches, Difficulty swallowing,Tooth/dental problems,Sore throat,  No sneezing, itching, ear ache, nasal  congestion, post nasal drip,  Cardio-vascular:  No chest pain, Orthopnea, PND, anasarca, dizziness, palpitations.no Bilateral lower extremity swelling  GI:  No heartburn, indigestion, abdominal pain, nausea, vomiting, diarrhea, change in bowel habits, loss of appetite, melena, blood in stool, hematemesis Resp:  no shortness of breath at rest. No dyspnea on exertion, No excess mucus, no productive cough, No non-productive cough, No coughing up of blood.No change in color of mucus.No wheezing. Skin:  no rash or lesions. No jaundice GU:  no dysuria, change in color of urine, no urgency or frequency. No straining to urinate.  No flank pain.  Musculoskeletal:  No joint pain or no joint swelling. No decreased range of motion. No back pain.  Psych:  No change in mood or affect. No depression or anxiety. No memory loss.  Neuro: no localizing neurological complaints, no tingling, no weakness, no double vision, no gait abnormality, no slurred speech, no  confusion  All systems reviewed and apart from [place] all are negative  Past Medical History:   Past Medical History:  Diagnosis Date  . Anticoagulation adequate 05/23/2019  . Atrial fibrillation ([place])   . CAD (coronary artery disease) of artery bypass graft 2000   2000 with multiple bypasses   . Chronic anticoagulation    Coumadin therapy for mechanical aortic valve. Postoperative atrial fibrillation.   . Essential hypertension   . H/O mechanical aortic valve replacement 2000   [employer] AVR  . Hypothyroidism   . Premature ventricular contractions   . S/P ablation of atrial fibrillation 05/22/19 05/23/2019  . Typical atrial flutter ([place]) 05/23/2019     Past Surgical History:  Procedure Laterality Date  . AORTIC VALVE REPLACEMENT (AVR)/CORONARY ARTERY BYPASS GRAFTING (CABG)  2000  . ATRIAL FIBRILLATION ABLATION N/A 05/22/2019   Procedure: ATRIAL FIBRILLATION ABLATION;  Surgeon: Constance Haw, MD;  Location: [place] [facility];  Service: Cardiovascular;  Laterality: N/A;  . CARDIOVERSION N/A 02/24/2018   Procedure: CARDIOVERSION;  Surgeon: Fay Records, MD;  Location: [facility] ENDOSCOPY;  Service: Cardiovascular;  Laterality: N/A;  . CARDIOVERSION N/A 08/15/2018   Procedure: CARDIOVERSION;  Surgeon: Thayer Headings, MD;  Location: [place];  Service: Cardiovascular;  Laterality: N/A;  . COLONOSCOPY WITH PROPOFOL N/A 01/08/2018   Procedure: COLONOSCOPY WITH PROPOFOL;  Surgeon: Arta Silence, MD;  Location: [facility] ENDOSCOPY;  Service: Gastroenterology;  Laterality: N/A;  . CORONARY/GRAFT ANGIOGRAPHY N/A 08/14/2018   Procedure: CORONARY/GRAFT ANGIOGRAPHY;  Surgeon: [name], MD;  Location: [place] [facility];  Service: Cardiovascular;  Laterality: N/A;  . ESOPHAGOGASTRODUODENOSCOPY (EGD) WITH PROPOFOL N/A 01/06/2018   Procedure: ESOPHAGOGASTRODUODENOSCOPY (EGD) WITH PROPOFOL;  Surgeon: Otis Brace, MD;  Location: [facility] ENDOSCOPY;  Service: Gastroenterology;  Laterality: N/A;     Social History:  Ambulatory   independently       reports that he has quit smoking. His smoking use included cigarettes. He has never used smokeless tobacco. He reports that he does not drink alcohol and does not use drugs.     Family History:   Family History  Problem Relation Age of Onset  . Hypertension Mother   . Heart attack Mother   . Hypertension Father     Allergies: Allergies  Allergen Reactions  . Carvedilol Shortness Of Breath    wheezing  . Lopid [Gemfibrozil] Other (See Comments)    transaminitis  . Monopril [Fosinopril] Other (See Comments)    transaminitis  . Vicodin [Hydrocodone-Acetaminophen]     Severe sensitivity  . Phenergan [Promethazine Hcl] Other (See Comments)  Severe somnolence.  . Zocor [Simvastatin]     Short memory loss.  [name] [Olmesartan]     Abdominal cramping and increased stools/ irritable bowel  . Codeine Nausea And Vomiting  . Prilosec [Omeprazole]     dyspepsia      Prior to Admission medications   Medication Sig Start Date End Date Taking? Authorizing Provider  Cholecalciferol (VITAMIN D) 2000 units tablet Take 2,000 Units by mouth daily.    [provider]  diltiazem (CARDIZEM CD) 120 MG 24 hr capsule Take 1 capsule (120 mg total) by mouth daily. 12/07/19 03/06/20  Camnitz, Ocie Doyne, MD  diltiazem (CARDIZEM) 30 MG tablet TAKE 1 TABLET AS NEEDED FOR PALPITATIONS OR HEART RATE >100 10/05/19   Camnitz, Ocie Doyne, MD  diphenhydramine-acetaminophen (TYLENOL PM) 25-500 MG TABS tablet Take 0.5 tablets by mouth at bedtime as needed (sleep).    [provider]  ferrous sulfate 325 (65 FE) MG tablet Take 325 mg by mouth daily with supper.    [provider]  furosemide (LASIX) 20 MG tablet Take 20 mg by mouth daily.    [provider]  levothyroxine (SYNTHROID) 125 MCG tablet Take 125 mcg by mouth daily before breakfast.  11/21/18   [provider]  Multiple Vitamins-Minerals  (PRESERVISION AREDS 2 PO) Take 1 capsule by mouth 2 (two) times daily.    [provider]  pantoprazole (PROTONIX) 40 MG tablet Take 40 mg by mouth 4 (four) times a week.    [provider]  Polyethyl Glycol-Propyl Glycol (SYSTANE OP) Place 1 drop into both eyes daily as needed (dry eyes).    [provider]  spironolactone (ALDACTONE) 25 MG tablet Take 1 tablet (25 mg total) by mouth daily. 02/22/20   Camnitz, Ocie Doyne, MD  sulfamethoxazole-trimethoprim (BACTRIM DS) 800-160 MG tablet  06/15/19   [provider]  tobramycin (TOBREX) 0.3 % ophthalmic solution Place 1 drop into the left eye See admin instructions. Instill 1 drop into left eye 1 day before, the day of, and the day after eye injections administered at Dr's office    [provider]  valsartan (DIOVAN) 320 MG tablet Take 1 tablet (320 mg total) by mouth daily. 09/10/19   Camnitz, Ocie Doyne, MD  warfarin (COUMADIN) 7.5 MG tablet Take 7.5 mg by mouth daily. Per patient taking '5mg'$  on Sundays    [provider]   Physical Exam: Vitals with BMI 02/27/2020 02/27/2020 02/27/2020  Height - - -  Weight - - -  BMI - - -  Systolic 106 269 485  Diastolic 59 51 49  Pulse 72 69 71     1. General:  in No  Acute distress   Chronically ill  -appearing 2. Psychological: Alert and   Oriented 3. Head/ENT:    Dry Mucous Membranes                          Head Non traumatic, neck supple                          Poor Dentition 4. SKIN:   decreased Skin turgor,  Skin clean Dry and intact no rash 5. Heart: Regular rate and rhythm no Murmur, no Rub or gallop 6. Lungs:  no wheezes or crackles   7. Abdomen: Soft,  Suprapubic tender, Non distended  bowel sounds present 8. Lower extremities: no clubbing, cyanosis, no  edema 9. Neurologically  Grossly intact, moving all 4 extremities equally   10. MSK: Normal range of motion   All other LABS:     Recent Labs  Lab 02/27/20 2105  WBC 12.3*   NEUTROABS 9.3*  HGB 9.7*  HCT 29.9*  MCV 106.8*  PLT 141*     Recent Labs  Lab 02/27/20 2105  NA 136  K 5.1  CL 108  CO2 18*  GLUCOSE 152*  BUN 31*  CREATININE 1.94*  CALCIUM 8.9     Recent Labs  Lab 02/27/20 2105  AST 56*  ALT 23  ALKPHOS 63  BILITOT 1.9*  PROT 6.6  ALBUMIN 3.3*       Cultures:    Component Value Date/Time   SDES URINE, CLEAN CATCH 10/22/2018 1103   SPECREQUEST NONE 10/22/2018 1103   CULT  10/22/2018 1103    NO GROWTH Performed at [facility], [place] [address], [place], [place] [address]    REPTSTATUS 10/23/2018 FINAL 10/22/2018 1103     Radiological Exams on Admission: DG Chest Port 1 View  Result Date: 02/27/2020 CLINICAL DATA:  Sepsis EXAM: PORTABLE CHEST 1 VIEW COMPARISON:  04/10/2019 FINDINGS: Single frontal view of the chest demonstrates a stable cardiac silhouette with postsurgical changes from median sternotomy. No airspace disease, effusion, or pneumothorax. No acute bony abnormalities. IMPRESSION: 1. No acute intrathoracic process. Electronically Signed   By: Randa Ngo M.D.   On: 02/27/2020 20:51    Chart has been reviewed    Assessment/Plan  86 y.o. male with medical history significant of hypertension, CAD status post CABG, atrial fibrillation status post ablation in September 2020, mechanical [employer] Jude AVR on Coumadin, GI bleed in 2019, hypothyroidism  Admitted for Sepsis due to UTI  Present on Admission: . Sepsis ([place]) -  -SIRS criteria met with   elevated white blood cell count,  fever.    With  evidence of end organ damage such as acute renal failure, elevated lactic acid   -Most likely source being  Urinary,   - Obtain serial lactic acid and procalcitonin level.  - Initiate IV antibiotics   - await results of blood and urine culture  - Rehydrate   . Symptomatic anemia -patient on chronic anticoagulation for aortic valve replacement with mechanical valve...  Currently on Coumadin.  Denies any melena or  bright red blood per rectum.  Hemoccult positive in ER.  Obtain anemia panel continue anticoagulation for right now as it is no evidence of ongoing blood loss.  May need further investigation pending results of anemia panel it is possible patient has slow GI blood loss that is not picked up in the ER   . Paroxysmal atrial flutter ([place]) -currently on anticoagulation with Coumadin.  Hold home medications given her hypotension  . Essential hypertension -hold home medications given hypotension   . CAD (coronary artery disease) of artery bypass graft -continue aspirin patient does not tolerate beta-blocker  . Hypothyroidism chronic stable continue Synthroid restart home meds  . AKI acute on chronic (acute kidney injury) ([place]) -gently rehydrate obtain urine electrolytes and continue to follow.  Given underlying CKD avoid nephrotoxic medication   History of aortic valve replacement -for now continue on Coumadin monitor  hemoglobin  Other plan as per orders.  DVT prophylaxis:  On coumadin    Code Status:    Code Status: Prior  DNR/DNI   as per patient   I had personally discussed CODE STATUS with patient       Family Communication:  Family not at  Bedside    Disposition Plan:   To home once workup is complete and patient is stable   Following barriers for discharge:                               Pain controlled with PO medications                               Afebrile, white count improving able to transition to PO antibiotics                     Would benefit from PT/OT eval prior to DC  Ordered                                       Consults called: none  Admission status:  ED Disposition    ED Disposition Condition [place]: [place] [[id number]]  Level of Care: Progressive [102]  Admit to Progressive based on following criteria: MULTISYSTEM THREATS such as stable sepsis, metabolic/electrolyte imbalance with or without encephalopathy  that is responding to early treatment.  May admit patient to [facility] or [facility] if equivalent level of care is available:: No  Covid Evaluation: Confirmed COVID Negative  Admission Type: Urgent [2]  Diagnosis: Sepsis [facility]) [[id number]]  Admitting Physician: Toy Baker [[id number]]  Attending Physician: Toy Baker [[id number]]  Estimated length of stay: past midnight tomorrow  Certification:: I certify this patient will need inpatient services for at least 2 midnights         inpatient     I Expect 2 midnight stay secondary to severity of patient's current illness need for inpatient interventions justified by the following:  hemodynamic instability despite optimal treatment (  Hypotension )   Severe lab/radiological/exam abnormalities including:    AKI and extensive comorbidities including:  CAD  CKD . Chronic anticoagulation  That are currently affecting medical management.  I expect  patient to be hospitalized for 2 midnights requiring inpatient medical care.  Patient is at high risk for adverse outcome (such as loss of life or disability) if not treated.  Indication for inpatient stay as follows:   Hemodynamic instability despite maximal medical therapy,    Need for IV antibiotics, IV fluids,      Level of care  Progressive  tele indefinitely please discontinue once patient no longer qualifies COVID-19 Labs    Lab Results  Component Value Date   [place] 02/27/2020     Precautions: admitted as  Covid Negative     PPE: Used by the provider:   N95  eye Goggles,  Gloves     Phillp Dolores 02/28/2020, 12:20 AM     [facility]     after 2 AM please page floor coverage PA If 7AM-7PM, please contact the day team taking care of the patient using [email or web address]   Patient was evaluated in the context of the global COVID-19 pandemic, which necessitated consideration that the patient might be at risk for infection with the SARS-CoV-2 virus  that causes COVID-19. Institutional protocols and algorithms that pertain to the evaluation of patients at risk for COVID-19 are in a state of rapid change based on information released by regulatory bodies including the CDC and federal  and state organizations. These policies and algorithms were followed during the patient's care.

## 2020-02-28 DIAGNOSIS — Z952 Presence of prosthetic heart valve: Secondary | ICD-10-CM

## 2020-02-28 DIAGNOSIS — N179 Acute kidney failure, unspecified: Secondary | ICD-10-CM | POA: Diagnosis present

## 2020-02-28 LAB — PROTIME-INR
INR: 2.6 — ABNORMAL HIGH (ref 0.8–1.2)
Prothrombin Time: 27.1 seconds — ABNORMAL HIGH (ref 11.4–15.2)

## 2020-02-28 LAB — CBC WITH DIFFERENTIAL/PLATELET
Abs Immature Granulocytes: 0.03 10*3/uL (ref 0.00–0.07)
Basophils Absolute: 0 10*3/uL (ref 0.0–0.1)
Basophils Relative: 0 %
Eosinophils Absolute: 0.2 10*3/uL (ref 0.0–0.5)
Eosinophils Relative: 3 %
HCT: 30.7 % — ABNORMAL LOW (ref 39.0–52.0)
Hemoglobin: 10.2 g/dL — ABNORMAL LOW (ref 13.0–17.0)
Immature Granulocytes: 0 %
Lymphocytes Relative: 14 %
Lymphs Abs: 1.2 10*3/uL (ref 0.7–4.0)
MCH: 35.2 pg — ABNORMAL HIGH (ref 26.0–34.0)
MCHC: 33.2 g/dL (ref 30.0–36.0)
MCV: 105.9 fL — ABNORMAL HIGH (ref 80.0–100.0)
Monocytes Absolute: 1 10*3/uL (ref 0.1–1.0)
Monocytes Relative: 12 %
Neutro Abs: 5.9 10*3/uL (ref 1.7–7.7)
Neutrophils Relative %: 71 %
Platelets: 127 10*3/uL — ABNORMAL LOW (ref 150–400)
RBC: 2.9 MIL/uL — ABNORMAL LOW (ref 4.22–5.81)
RDW: 13.2 % (ref 11.5–15.5)
WBC: 8.4 10*3/uL (ref 4.0–10.5)
nRBC: 0 % (ref 0.0–0.2)

## 2020-02-28 LAB — RETICULOCYTES
Immature Retic Fract: 11.1 % (ref 2.3–15.9)
RBC.: 2.92 MIL/uL — ABNORMAL LOW (ref 4.22–5.81)
Retic Count, Absolute: 45 10*3/uL (ref 19.0–186.0)
Retic Ct Pct: 1.5 % (ref 0.4–3.1)

## 2020-02-28 LAB — IRON AND TIBC
Iron: 61 ug/dL (ref 45–182)
Saturation Ratios: 20 % (ref 17.9–39.5)
TIBC: 309 ug/dL (ref 250–450)
UIBC: 248 ug/dL

## 2020-02-28 LAB — TSH: TSH: 0.253 u[IU]/mL — ABNORMAL LOW (ref 0.350–4.500)

## 2020-02-28 LAB — TYPE AND SCREEN
ABO/RH(D): A POS
Antibody Screen: NEGATIVE

## 2020-02-28 LAB — LACTIC ACID, PLASMA
Lactic Acid, Venous: 1 mmol/L (ref 0.5–1.9)
Lactic Acid, Venous: 1.2 mmol/L (ref 0.5–1.9)

## 2020-02-28 LAB — VITAMIN B12: Vitamin B-12: 378 pg/mL (ref 180–914)

## 2020-02-28 LAB — ABO/RH: ABO/RH(D): A POS

## 2020-02-28 LAB — PHOSPHORUS: Phosphorus: 2.7 mg/dL (ref 2.5–4.6)

## 2020-02-28 LAB — MAGNESIUM: Magnesium: 1.8 mg/dL (ref 1.7–2.4)

## 2020-02-28 LAB — PROCALCITONIN: Procalcitonin: 0.11 ng/mL

## 2020-02-28 LAB — FERRITIN: Ferritin: 88 ng/mL (ref 24–336)

## 2020-02-28 LAB — FOLATE: Folate: 12 ng/mL (ref >5.9)

## 2020-02-28 MED ORDER — DOCUSATE SODIUM 100 MG PO CAPS
100.0000 mg | ORAL_CAPSULE | Freq: Two times a day (BID) | ORAL | Status: DC
  Administered 2020-02-28 – 2020-03-01 (×4): 100 mg via ORAL
  Filled 2020-02-28 (×5): qty 1

## 2020-02-28 MED ORDER — DIPHENHYDRAMINE HCL 25 MG PO CAPS
25.0000 mg | ORAL_CAPSULE | Freq: Once | ORAL | Status: DC
  Filled 2020-02-28: qty 1

## 2020-02-28 MED ORDER — LEVOTHYROXINE SODIUM 25 MCG PO TABS
125.0000 ug | ORAL_TABLET | Freq: Every day | ORAL | Status: DC
  Administered 2020-02-28 – 2020-03-01 (×3): 125 ug via ORAL
  Filled 2020-02-28 (×3): qty 1

## 2020-02-28 MED ORDER — WARFARIN SODIUM 7.5 MG PO TABS
7.5000 mg | ORAL_TABLET | ORAL | Status: DC
  Administered 2020-02-28 – 2020-02-29 (×2): 7.5 mg via ORAL
  Filled 2020-02-28 (×2): qty 1

## 2020-02-28 MED ORDER — WARFARIN - PHARMACIST DOSING INPATIENT: Freq: Every day | Status: DC

## 2020-02-28 MED ORDER — FERROUS SULFATE 325 (65 FE) MG PO TABS
325.0000 mg | ORAL_TABLET | Freq: Every day | ORAL | Status: DC
  Administered 2020-02-28 – 2020-02-29 (×2): 325 mg via ORAL
  Filled 2020-02-28 (×2): qty 1

## 2020-02-28 MED ORDER — SODIUM CHLORIDE 0.9% FLUSH
3.0000 mL | Freq: Two times a day (BID) | INTRAVENOUS | Status: DC
  Administered 2020-02-28 – 2020-03-01 (×6): 3 mL via INTRAVENOUS

## 2020-02-28 MED ORDER — ACETAMINOPHEN 650 MG RE SUPP: 650.0000 mg | Freq: Four times a day (QID) | RECTAL | Status: DC | PRN

## 2020-02-28 MED ORDER — PANTOPRAZOLE SODIUM 40 MG PO TBEC
40.0000 mg | DELAYED_RELEASE_TABLET | ORAL | Status: DC
  Administered 2020-02-28 – 2020-03-01 (×2): 40 mg via ORAL
  Filled 2020-02-28 (×2): qty 1

## 2020-02-28 MED ORDER — SODIUM CHLORIDE 0.9 % IV SOLN: INTRAVENOUS | Status: AC

## 2020-02-28 MED ORDER — ONDANSETRON HCL 4 MG PO TABS: 4.0000 mg | ORAL_TABLET | Freq: Four times a day (QID) | ORAL | Status: DC | PRN

## 2020-02-28 MED ORDER — ONDANSETRON HCL 4 MG/2ML IJ SOLN: 4.0000 mg | Freq: Four times a day (QID) | INTRAMUSCULAR | Status: DC | PRN

## 2020-02-28 MED ORDER — WARFARIN SODIUM 5 MG PO TABS
5.0000 mg | ORAL_TABLET | ORAL | Status: DC
  Administered 2020-02-28: 5 mg via ORAL
  Filled 2020-02-28: qty 1

## 2020-02-28 MED ORDER — ACETAMINOPHEN 325 MG PO TABS
650.0000 mg | ORAL_TABLET | Freq: Four times a day (QID) | ORAL | Status: DC | PRN
  Administered 2020-02-28 – 2020-02-29 (×3): 650 mg via ORAL
  Filled 2020-02-28 (×3): qty 2

## 2020-02-28 NOTE — Progress Notes (Signed)
Pt. Requesting med for sleep. On call for [facility] paged to make aware.

## 2020-02-28 NOTE — ED Notes (Signed)
RN attempted to call report, secretary requested to call back in 25 minutes , Nurse is in reports

## 2020-02-28 NOTE — ED Notes (Signed)
Breakfast tray delivered

## 2020-02-28 NOTE — ED Notes (Signed)
SDU Breakfast Ordered 

## 2020-02-28 NOTE — Progress Notes (Signed)
PROGRESS NOTE  Jason Santana KCM:[id number] DOB: 09-Dec-1931 DOA: 02/27/2020 PCP: [name], MD   LOS: 1 day   Brief Narrative / Interim history: 86 year old male with history of hypertension, CAD post CABG, A. fib with history of ablation, mechanical Saint Jude AVR on Coumadin, GI bleed in 2019, hypothyroidism, comes to the hospital with complaints of fever at home as well as generalized malaise and increased in urinary frequency.  He denies any nausea or vomiting, denies any chest pain, denies any shortness of breath.  He denies any flank pain.  Subjective / 24h Interval events: He is feeling a little bit better in the emergency room  Assessment & Plan: Principal Problem Sesis due to urinary tract infectionp-met criteria with elevated WBC, fever, as well as acute kidney injury.  Patient was given IV fluids, repeat renal function in the morning.  Continue IV antibiotics and monitor cultures.  Active Problems Symptomatic anemia-anemia panel pending, no evidence of bleeding, fecal occult was negative.  Paroxysmal a flutter-currently anticoagulated with Coumadin  Essential hypertension-hold home medications given sepsis  CAD with history of CABG-continue aspirin, no chest pain and this appears stable  Hypothyroidism-stable, continue Synthroid  Acute kidney injury on chronic kidney disease stage IIIb-Baseline creatinine 1.2-1.6, currently 1.9.  Repeat in the morning.  History of AVR-continue Coumadin  Scheduled Meds: . docusate sodium  100 mg Oral BID  . ferrous sulfate  325 mg Oral Q supper  . levothyroxine  125 mcg Oral QAC breakfast  . pantoprazole  40 mg Oral Once per day on Sun Tue Thu Sat  . sodium chloride flush  3 mL Intravenous Q12H  . warfarin  5 mg Oral Once per day on Sun  . warfarin  7.5 mg Oral Once per day on Mon Tue Wed Thu Fri Sat  . Warfarin - Pharmacist Dosing Inpatient   Does not apply q1600   Continuous Infusions: . sodium chloride    . cefTRIAXone  (ROCEPHIN)  IV     PRN Meds:.acetaminophen **OR** acetaminophen, ondansetron **OR** ondansetron (ZOFRAN) IV  Diet Orders (From admission, onward)    Start     Ordered   02/28/20 0703  Diet regular Room service appropriate? Yes; Fluid consistency: Thin  Diet effective now       Question Answer Comment  Room service appropriate? Yes   Fluid consistency: Thin      02/28/20 0702          DVT prophylaxis:  warfarin (COUMADIN) tablet 7.5 mg  warfarin (COUMADIN) tablet 5 mg     Code Status: DNR  Family Communication: no family at bedside   Status is: Inpatient  Dispo:  Patient From: Home  Planned Disposition: Home  Expected discharge date: 02/29/20  Medically stable for discharge: No  Consultants:  None   Procedures:  none  Microbiology  Blood and urine cultures - pending  Antimicrobials: Ceftriaxone 6/26 >>    Objective: Vitals:   02/28/20 0400 02/28/20 0500 02/28/20 0638 02/28/20 0730  BP: (!) 123/50 (!) 126/51 (!) 144/58 (!) 108/93  Pulse: (!) 59 63 70 70  Resp: '16 15 16 15  ' Temp:   (!) 97.4 F (36.3 C) 97.7 F (36.5 C)  TempSrc:   Oral Oral  SpO2: 94% 95% 99% 98%  Weight:      Height:       No intake or output data in the 24 hours ending 02/28/20 0907 Filed Weights   02/27/20 2010  Weight: 76.7 kg    Examination:  Constitutional: NAD Eyes: no scleral icterus ENMT: Mucous membranes are moist.  Neck: normal, supple Respiratory: clear to auscultation bilaterally, no wheezing, no crackles.  Cardiovascular: Regular rate and rhythm, 3/6 SEM. No LE edema.  Abdomen: non distended, no tenderness. Bowel sounds positive.  Musculoskeletal: no clubbing / cyanosis.  Skin: no rashes Neurologic: CN 2-12 grossly intact. Strength 5/5 in all 4.  Psychiatric: Normal judgment and insight. Alert and oriented x 3. Normal mood.    Data Reviewed: I have independently reviewed following labs and imaging studies   CBC: Recent Labs  Lab 02/27/20 2105  02/28/20 0530  WBC 12.3* 8.4  NEUTROABS 9.3* 5.9  HGB 9.7* 10.2*  HCT 29.9* 30.7*  MCV 106.8* 105.9*  PLT 141* 196*   Basic Metabolic Panel: Recent Labs  Lab 02/27/20 2105 02/28/20 0530  NA 136  --   K 5.1  --   CL 108  --   CO2 18*  --   GLUCOSE 152*  --   BUN 31*  --   CREATININE 1.94*  --   CALCIUM 8.9  --   MG  --  1.8  PHOS  --  2.7   Liver Function Tests: Recent Labs  Lab 02/27/20 2105  AST 56*  ALT 23  ALKPHOS 63  BILITOT 1.9*  PROT 6.6  ALBUMIN 3.3*   Coagulation Profile: Recent Labs  Lab 02/27/20 2105 02/28/20 0530  INR 2.6* 2.6*   HbA1C: No results for input(s): HGBA1C in the last 72 hours. CBG: No results for input(s): GLUCAP in the last 168 hours.  Recent Results (from the past 240 hour(s))  Blood Culture (routine x 2)     Status: None (Preliminary result)   Collection Time: 02/27/20  9:05 PM   Specimen: BLOOD LEFT FOREARM  Result Value Ref Range Status   Specimen Description BLOOD LEFT FOREARM  Final   Special Requests   Final    BOTTLES DRAWN AEROBIC AND ANAEROBIC Blood Culture results may not be optimal due to an excessive volume of blood received in culture bottles   Culture   Final    NO GROWTH < 12 HOURS Performed at [facility], [place] [address], [place], [place] [address]    Report Status PENDING  Incomplete  Blood Culture (routine x 2)     Status: None (Preliminary result)   Collection Time: 02/27/20  9:05 PM   Specimen: BLOOD  Result Value Ref Range Status   Specimen Description BLOOD RIGHT ANTECUBITAL  Final   Special Requests   Final    BOTTLES DRAWN AEROBIC AND ANAEROBIC Blood Culture results may not be optimal due to an excessive volume of blood received in culture bottles   Culture   Final    NO GROWTH < 12 HOURS Performed at [facility], [place] [address], [place], [place] [address]    Report Status PENDING  Incomplete  SARS Coronavirus 2 by RT PCR (hospital order, performed in [facility] hospital lab)  Nasopharyngeal Nasopharyngeal Swab     Status: None   Collection Time: 02/27/20  9:05 PM   Specimen: Nasopharyngeal Swab  Result Value Ref Range Status   SARS Coronavirus 2 NEGATIVE NEGATIVE Final    Comment: (NOTE) SARS-CoV-2 target nucleic acids are NOT DETECTED.  The SARS-CoV-2 RNA is generally detectable in upper and lower respiratory specimens during the acute phase of infection. The lowest concentration of SARS-CoV-2 viral copies this assay can detect is 250 copies / mL. A negative result does  not preclude SARS-CoV-2 infection and should not be used as the sole basis for treatment or other patient management decisions.  A negative result may occur with improper specimen collection / handling, submission of specimen other than nasopharyngeal swab, presence of viral mutation(s) within the areas targeted by this assay, and inadequate number of viral copies (<250 copies / mL). A negative result must be combined with clinical observations, patient history, and epidemiological information.  Fact Sheet for Patients:   StrictlyIdeas.no  Fact Sheet for Healthcare Providers: BankingDealers.co.za  This test is not yet approved or  cleared by the [place] FDA and has been authorized for detection and/or diagnosis of SARS-CoV-2 by FDA under an Emergency Use Authorization (EUA).  This EUA will remain in effect (meaning this test can be used) for the duration of the COVID-19 declaration under Section 564(b)(1) of the Act, 21 U.S.C. section 360bbb-3(b)(1), unless the authorization is terminated or revoked sooner.  Performed at [facility], [place] [address], [place], [place] [address]      Radiology Studies: DG Chest Port 1 View  Result Date: 02/27/2020 CLINICAL DATA:  Sepsis EXAM: PORTABLE CHEST 1 VIEW COMPARISON:  04/10/2019 FINDINGS: Single frontal view of the chest demonstrates a stable cardiac silhouette with postsurgical  changes from median sternotomy. No airspace disease, effusion, or pneumothorax. No acute bony abnormalities. IMPRESSION: 1. No acute intrathoracic process. Electronically Signed   By: Randa Ngo M.D.   On: 02/27/2020 20:51    Marzetta Board, MD, PhD [facility]  Between 7 am - 7 pm I am available, please contact me via Amion or Securechat  Between 7 pm - 7 am I am not available, please contact night coverage MD/APP via Amion

## 2020-02-28 NOTE — Plan of Care (Signed)
  Problem: Activity: Goal: Risk for activity intolerance will decrease Outcome: Progressing   Problem: Coping: Goal: Level of anxiety will decrease Outcome: Progressing   Problem: Safety: Goal: Ability to remain free from injury will improve Outcome: Progressing   

## 2020-02-28 NOTE — ED Notes (Signed)
Lunch tray delivered.

## 2020-02-28 NOTE — ED Notes (Signed)
Dinner tray delivered.

## 2020-02-28 NOTE — Progress Notes (Signed)
ANTICOAGULATION CONSULT NOTE - Initial Consult  Pharmacy Consult for Coumadin Indication: AVR/Afib  Allergies  Allergen Reactions  . Carvedilol Shortness Of Breath    wheezing  . Lopid [Gemfibrozil] Other (See Comments)    transaminitis  . Monopril [Fosinopril] Other (See Comments)    transaminitis  . Vicodin [Hydrocodone-Acetaminophen]     Severe sensitivity  . Phenergan [Promethazine Hcl] Other (See Comments)    Severe somnolence.  . Zocor [Simvastatin]     Short memory loss.  [name] [Olmesartan]     Abdominal cramping and increased stools/ irritable bowel  . Codeine Nausea And Vomiting  . Prilosec [Omeprazole]     dyspepsia    Patient Measurements: Height: 5\' 10"  (177.8 cm) Weight: 76.7 kg (169 lb) IBW/kg (Calculated) : 73  Vital Signs: Temp: 99.2 F (37.3 C) (06/26 2014) Temp Source: Oral (06/26 2014) BP: 110/59 (06/26 2217) Pulse Rate: 72 (06/26 2217)  Labs: Recent Labs    02/27/20 2105  HGB 9.7*  HCT 29.9*  PLT 141*  APTT 50*  LABPROT 26.9*  INR 2.6*  CREATININE 1.94*    Estimated Creatinine Clearance: 28.7 mL/min (A) (by C-G formula based on SCr of 1.94 mg/dL (H)).   Medical History: Past Medical History:  Diagnosis Date  . Anticoagulation adequate 05/23/2019  . Atrial fibrillation ([place])   . CAD (coronary artery disease) of artery bypass graft 2000   2000 with multiple bypasses   . Chronic anticoagulation    Coumadin therapy for mechanical aortic valve. Postoperative atrial fibrillation.   . Essential hypertension   . H/O mechanical aortic valve replacement 2000   St Jude AVR  . Hypothyroidism   . Premature ventricular contractions   . S/P ablation of atrial fibrillation 05/22/19 05/23/2019  . Typical atrial flutter ([place]) 05/23/2019    Assessment: 86yo male c/o fever and frequent urination, admitted for urosepsis, to continue Coumadin for AVR/Afib; current INR at goal w/ last dose taken 6/25.  Goal of Therapy:  INR 2-3  (per records pt  had higher goal but was decreased for h/o bleed)   Plan:  Continue home Coumadin dose of 7.5mg  daily except 5mg  Sun and monitor INR for dose adjustments.  Wynona Neat, PharmD, BCPS  02/28/2020,12:33 AM

## 2020-02-29 LAB — CBC
HCT: 32.1 % — ABNORMAL LOW (ref 39.0–52.0)
Hemoglobin: 10.7 g/dL — ABNORMAL LOW (ref 13.0–17.0)
MCH: 34.5 pg — ABNORMAL HIGH (ref 26.0–34.0)
MCHC: 33.3 g/dL (ref 30.0–36.0)
MCV: 103.5 fL — ABNORMAL HIGH (ref 80.0–100.0)
Platelets: 141 10*3/uL — ABNORMAL LOW (ref 150–400)
RBC: 3.1 MIL/uL — ABNORMAL LOW (ref 4.22–5.81)
RDW: 13.2 % (ref 11.5–15.5)
WBC: 7 10*3/uL (ref 4.0–10.5)
nRBC: 0 % (ref 0.0–0.2)

## 2020-02-29 LAB — BASIC METABOLIC PANEL
Anion gap: 9 (ref 5–15)
BUN: 27 mg/dL — ABNORMAL HIGH (ref 8–23)
CO2: 18 mmol/L — ABNORMAL LOW (ref 22–32)
Calcium: 9.1 mg/dL (ref 8.9–10.3)
Chloride: 111 mmol/L (ref 98–111)
Creatinine, Ser: 1.55 mg/dL — ABNORMAL HIGH (ref 0.61–1.24)
GFR calc Af Amer: 47 mL/min — ABNORMAL LOW (ref >60)
GFR calc non Af Amer: 40 mL/min — ABNORMAL LOW (ref >60)
Glucose, Bld: 107 mg/dL — ABNORMAL HIGH (ref 70–99)
Potassium: 4.8 mmol/L (ref 3.5–5.1)
Sodium: 138 mmol/L (ref 135–145)

## 2020-02-29 LAB — PROTIME-INR
INR: 2.5 — ABNORMAL HIGH (ref 0.8–1.2)
Prothrombin Time: 26.1 seconds — ABNORMAL HIGH (ref 11.4–15.2)

## 2020-02-29 NOTE — Evaluation (Addendum)
Occupational Therapy Evaluation Patient Details Name: Jason Santana MRN: [id number] DOB: [birth year] Today's Date: 02/29/2020    History of Present Illness 86 year old male with history of hypertension, CAD post CABG, A. fib with history of ablation, mechanical Saint Jude AVR on Coumadin, GI bleed in 2019, hypothyroidism, comes to the hospital with complaints of fever at home as well as generalized malaise and increased in urinary frequency.    Clinical Impression   This 86 y/o male presents with the above. PTA pt reports independence with ADL and functional mobility. Pt currently presenting with decreased activity tolerance and impaired balance with mobility/functional tasks. Pt tolerating room and short distance mobility into hallway, requiring minA with increased distances as pt with x2 mild LOB requiring minA to correct. Discussed option of using DME for increased stability with pt declining today, reports he tends to move better when wearing his shoes. Overall he is performing ADL tasks at supervision -minguard assist level, plans to return home with available assist from his spouse. Pt on RA with SpO2 >/=94% throughout. Pt to benefit from continued acute OT services to maximize his overall safety and independence with ADL and mobility. Do not anticipate pt will require follow up OT services.     Follow Up Recommendations  No OT follow up;Supervision/Assistance - 24 hour    Equipment Recommendations  Tub/shower seat (vs none)           Precautions / Restrictions Precautions Precautions: Fall Restrictions Weight Bearing Restrictions: No      Mobility Bed Mobility               General bed mobility comments: OOB in recliner upon arrival  Transfers Overall transfer level: Needs assistance Equipment used: None Transfers: Sit to/from Stand Sit to Stand: Supervision         General transfer comment: for balance and safety    Balance Overall balance assessment: Needs  assistance Sitting-balance support: Feet supported Sitting balance-Leahy Scale: Good     Standing balance support: No upper extremity supported;During functional activity Standing balance-Leahy Scale: Fair                             ADL either performed or assessed with clinical judgement   ADL Overall ADL's : Needs assistance/impaired Eating/Feeding: Modified independent;Sitting   Grooming: Supervision/safety;Standing   Upper Body Bathing: Modified independent;Sitting   Lower Body Bathing: Min guard;Sit to/from stand   Upper Body Dressing : Modified independent;Sitting   Lower Body Dressing: Min guard;Sit to/from stand Lower Body Dressing Details (indicate cue type and reason): utilizing figure 4 for socks  Toilet Transfer: Ambulation;Minimal assistance Toilet Transfer Details (indicate cue type and reason): simulated via transfer to/from [place] and Hygiene: Min guard;Sit to/from stand       Functional mobility during ADLs: Minimal assistance General ADL Comments: pt with x2 LOB with hallway level mobility - discussed possible need for using RW however pt with preference not to use AD, reports will do better with his shoes on                          Pertinent Vitals/Pain Pain Assessment: No/denies pain     Hand Dominance Right   Extremity/Trunk Assessment Upper Extremity Assessment Upper Extremity Assessment: Overall WFL for tasks assessed   Lower Extremity Assessment Lower Extremity Assessment: Defer to PT evaluation   Cervical / Trunk Assessment Cervical / Trunk Assessment:  Normal   Communication Communication Communication: No difficulties   Cognition Arousal/Alertness: Awake/alert Behavior During Therapy: WFL for tasks assessed/performed Overall Cognitive Status: Within Functional Limits for tasks assessed                                     General Comments       Exercises      Shoulder Instructions      Home Living Family/patient expects to be discharged to:: Private residence Living Arrangements: Spouse/significant other Available Help at Discharge: Family;Available 24 hours/day Type of Home: House Home Access: Stairs to enter [employer] of Steps: 5 Entrance Stairs-Rails: Right Home Layout: Laundry or work area in basement;Two level;Able to live on main level with bedroom/bathroom     Bathroom Shower/Tub: Walk-in shower   Bathroom Toilet: Handicapped height     Home Equipment: Walker - 2 wheels          Prior Functioning/Environment Level of Independence: Independent                 OT Problem List: Impaired balance (sitting and/or standing);Decreased activity tolerance;Cardiopulmonary status limiting activity      OT Treatment/Interventions: Self-care/ADL training;Therapeutic exercise;Energy conservation;DME and/or AE instruction;Therapeutic activities;Patient/family education;Balance training    OT Goals(Current goals can be found in the care plan section) Acute Rehab OT Goals Patient Stated Goal: to go home OT Goal Formulation: With patient Time For Goal Achievement: 03/14/20 Potential to Achieve Goals: Good  OT Frequency: Min 2X/week   Barriers to D/C:            Co-evaluation              AM-PAC OT "6 Clicks" Daily Activity     Outcome Measure Help from another person eating meals?: None Help from another person taking care of personal grooming?: A Little Help from another person toileting, which includes using toliet, bedpan, or urinal?: A Little Help from another person bathing (including washing, rinsing, drying)?: A Little Help from another person to put on and taking off regular upper body clothing?: None Help from another person to put on and taking off regular lower body clothing?: A Little 6 Click Score: 20   End of Session Equipment Utilized During Treatment: Gait belt Nurse Communication:  Mobility status  Activity Tolerance: Patient tolerated treatment well Patient left: in chair;with call bell/phone within reach  OT Visit Diagnosis: Unsteadiness on feet (R26.81)                Time: 3817-7116 OT Time Calculation (min): 15 min Charges:  OT General Charges $OT Visit: 1 Visit OT Evaluation $OT Eval Moderate Complexity: 1 Mod  Lou Cal, OT Acute Rehabilitation Services Pager [phone] Office [phone]   Raymondo Band 02/29/2020, 5:24 PM

## 2020-02-29 NOTE — Discharge Instructions (Signed)

## 2020-02-29 NOTE — Evaluation (Signed)
Physical Therapy Evaluation Patient Details Name: Jason Santana MRN: [id number] DOB: 1932-06-22 Today's Date: 02/29/2020   History of Present Illness  86 year old male with history of hypertension, CAD post CABG, A. fib with history of ablation, mechanical Saint Jude AVR on Coumadin, GI bleed in 2019, hypothyroidism, comes to the hospital with complaints of fever at home as well as generalized malaise and increased in urinary frequency.   Clinical Impression  Pt admitted with above diagnosis. Pt was able to ambulate without device with need for min assist and cues due to slightly unsteady on feet. Pt also DOE 3/4 needing incr time to recover. Pt O2 sats did not pick up well on portable monitor therefore asked OT to check with dynamap when she sees pt later today. Should progress well.   Pt currently with functional limitations due to the deficits listed below (see PT Problem List). Pt will benefit from skilled PT to increase their independence and safety with mobility to allow discharge to the venue listed below.    Follow Up Recommendations Home health PT;Supervision/Assistance - 24 hour    Equipment Recommendations  None recommended by PT    Recommendations for Other Services       Precautions / Restrictions Precautions Precautions: Fall Restrictions Weight Bearing Restrictions: No      Mobility  Bed Mobility Overal bed mobility: Independent                Transfers Overall transfer level: Needs assistance Equipment used: None Transfers: Sit to/from Stand Sit to Stand: Min guard         General transfer comment: somewhat shaky upon standing therefore guard assist. Also stood rather impulseively.   Ambulation/Gait Ambulation/Gait assistance: Min guard;Min assist Gait Distance (Feet): 110 Feet Assistive device: None Gait Pattern/deviations: Step-through pattern;Decreased stride length   Gait velocity interpretation: <1.31 ft/sec, indicative of household  ambulator General Gait Details: Pt DOE 3/4 with ambulation making pt want to return to room rather quickly.  Pt also somewhat shaky and needs min assist at times due to unsteady gait.  Did discuss use of his RW at home and pt and wife state that will be no problem to use that at home until he regains strength.  Did try to monitor O2 sats while ambulating but the finger monitor was not seeming to pick up well.  Asked OT to try dynamap in pm to see if pt actually does desat. Of note, it did take pt 4-5 min to catch his breath at end of walk.   Stairs            Wheelchair Mobility    Modified Rankin (Stroke Patients Only)       Balance Overall balance assessment: Needs assistance Sitting-balance support: No upper extremity supported;Feet supported Sitting balance-Leahy Scale: Fair     Standing balance support: No upper extremity supported;During functional activity Standing balance-Leahy Scale: Poor Standing balance comment: relies on at least 1 UE supported as he fatigues.                              Pertinent Vitals/Pain Pain Assessment: No/denies pain    Home Living Family/patient expects to be discharged to:: Private residence Living Arrangements: Spouse/significant other Available Help at Discharge: Family;Available 24 hours/day Type of Home: House Home Access: Stairs to enter Entrance Stairs-Rails: Right Entrance Stairs-Number of Steps: 5 Home Layout: Laundry or work area in basement;Two level;Able to live on main level  with bedroom/bathroom Home Equipment: Walker - 2 wheels      Prior Function Level of Independence: Independent               Hand Dominance   Dominant Hand: Right    Extremity/Trunk Assessment   Upper Extremity Assessment Upper Extremity Assessment: Defer to OT evaluation    Lower Extremity Assessment Lower Extremity Assessment: Generalized weakness    Cervical / Trunk Assessment Cervical / Trunk Assessment: Normal   Communication   Communication: No difficulties  Cognition Arousal/Alertness: Awake/alert Behavior During Therapy: WFL for tasks assessed/performed Overall Cognitive Status: Within Functional Limits for tasks assessed                                        General Comments      Exercises     Assessment/Plan    PT Assessment Patient needs continued PT services  PT Problem List Decreased activity tolerance;Decreased balance;Decreased mobility;Decreased knowledge of use of DME;Decreased safety awareness;Cardiopulmonary status limiting activity;Decreased knowledge of precautions       PT Treatment Interventions DME instruction;Gait training;Functional mobility training;Therapeutic activities;Therapeutic exercise;Balance training;Patient/family education;Stair training    PT Goals (Current goals can be found in the Care Plan section)  Acute Rehab PT Goals Patient Stated Goal: to go home PT Goal Formulation: With patient Time For Goal Achievement: 03/14/20 Potential to Achieve Goals: Good    Frequency Min 3X/week   Barriers to discharge        Co-evaluation               AM-PAC PT "6 Clicks" Mobility  Outcome Measure Help needed turning from your back to your side while in a flat bed without using bedrails?: A Little Help needed moving from lying on your back to sitting on the side of a flat bed without using bedrails?: A Little Help needed moving to and from a bed to a chair (including a wheelchair)?: A Little Help needed standing up from a chair using your arms (e.g., wheelchair or bedside chair)?: A Little Help needed to walk in hospital room?: A Little Help needed climbing 3-5 steps with a railing? : A Little 6 Click Score: 18    End of Session Equipment Utilized During Treatment: Gait belt Activity Tolerance: Patient limited by fatigue Patient left: in chair;with call bell/phone within reach;with chair alarm set;with family/visitor  present Nurse Communication: Mobility status PT Visit Diagnosis: Unsteadiness on feet (R26.81);Muscle weakness (generalized) (M62.81)    Time: 6384-5364 PT Time Calculation (min) (ACUTE ONLY): 25 min   Charges:   PT Evaluation $PT Eval Moderate Complexity: 1 Mod PT Treatments $Gait Training: 8-22 mins        Mahalia Dykes W,PT Acute Rehabilitation Services Pager:  [phone]  Office:  [place] 02/29/2020, 2:09 PM

## 2020-02-29 NOTE — Progress Notes (Signed)
ANTICOAGULATION CONSULT NOTE - Initial Consult  Pharmacy Consult for Coumadin Indication: AVR/Afib  Allergies  Allergen Reactions  . Carvedilol Shortness Of Breath    wheezing  . Lopid [Gemfibrozil] Other (See Comments)    transaminitis  . Monopril [Fosinopril] Other (See Comments)    transaminitis  . Vicodin [Hydrocodone-Acetaminophen]     Severe sensitivity  . Phenergan [Promethazine Hcl] Other (See Comments)    Severe somnolence.  . Zocor [Simvastatin]     Short memory loss.  [name] [Olmesartan]     Abdominal cramping and increased stools/ irritable bowel  . Codeine Nausea And Vomiting  . Prilosec [Omeprazole]     dyspepsia    Patient Measurements: Height: 5\' 10"  (177.8 cm) Weight: 78.4 kg (172 lb 12.8 oz) IBW/kg (Calculated) : 73  Vital Signs: Temp: 100.2 F (37.9 C) (06/28 0402) Temp Source: Oral (06/28 0402) BP: 144/60 (06/28 0402) Pulse Rate: 90 (06/28 0402)  Labs: Recent Labs    02/27/20 2105 02/27/20 2105 02/28/20 0530 02/29/20 0418  HGB 9.7*   < > 10.2* 10.7*  HCT 29.9*  --  30.7* 32.1*  PLT 141*  --  127* 141*  APTT 50*  --   --   --   LABPROT 26.9*  --  27.1* 26.1*  INR 2.6*  --  2.6* 2.5*  CREATININE 1.94*  --   --  1.55*   < > = values in this interval not displayed.    Estimated Creatinine Clearance: 36 mL/min (A) (by C-G formula based on SCr of 1.55 mg/dL (H)).   Medical History: Past Medical History:  Diagnosis Date  . Anticoagulation adequate 05/23/2019  . Atrial fibrillation ([place])   . CAD (coronary artery disease) of artery bypass graft 2000   2000 with multiple bypasses   . Chronic anticoagulation    Coumadin therapy for mechanical aortic valve. Postoperative atrial fibrillation.   . Essential hypertension   . H/O mechanical aortic valve replacement 2000   St Jude AVR  . Hypothyroidism   . Premature ventricular contractions   . S/P ablation of atrial fibrillation 05/22/19 05/23/2019  . Typical atrial flutter ([place]) 05/23/2019     Assessment: 86yo male c/o fever and frequent urination, admitted for urosepsis, to continue Coumadin for AVR/Afib; current INR at goal w/ last dose taken 6/25.  INR 2.5 therapeutic on home regimen, H/H low but stable, plts up 141.    Goal of Therapy:  INR 2-3  (per records pt had higher goal but was decreased for h/o bleed)   Plan:  Continue home Coumadin dose of 7.5mg  daily except 5mg  Sun and monitor INR for dose adjustments. Daily INR, s/s bleeding  Bertis Ruddy, PharmD Clinical Pharmacist Please check AMION for all [place] numbers 02/29/2020 7:49 AM

## 2020-02-29 NOTE — Progress Notes (Signed)
PROGRESS NOTE  Jason Santana KNL:[id number] DOB: Dec 06, 1931 DOA: 02/27/2020 PCP: [name], MD   LOS: 2 days   Brief Narrative / Interim history: 86 year old male with history of hypertension, CAD post CABG, A. fib with history of ablation, mechanical [employer] AVR on Coumadin, GI bleed in 2019, hypothyroidism, comes to the hospital with complaints of fever at home as well as generalized malaise and increased in urinary frequency.  He denies any nausea or vomiting, denies any chest pain, denies any shortness of breath.  He denies any flank pain.  Subjective / 24h Interval events: Feeling okay this morning, does report that he had a fever overnight from 100, but says that it is gone now.  Overall he is feeling better.  He is asking about going home  Assessment & Plan: Principal Problem Sesis due to urinary tract infectionp-met criteria with elevated WBC, fever, as well as acute kidney injury.  Clinically he has improved however he still continues to spike a temp including of 101 this afternoon.  He is very weak and has little to no p.o. intake because he feels like he cannot eat that he is about to throw up.  Continue intravenous antibiotics, urine cultures are showing gram-negative rods.  Active Problems Anemia of likely chronic kidney disease-anemia panel pending fairly unremarkable, hemoglobin stable and there is no evidence of bleed  Paroxysmal a flutter-currently anticoagulated with Coumadin  Essential hypertension-hold home medications given sepsis  CAD with history of CABG-continue aspirin, no chest pain and this appears stable  Hypothyroidism-stable, continue Synthroid  Acute kidney injury on chronic kidney disease stage IIIb-Baseline creatinine 1.2-1.6, improved after IV fluids and normalized this morning to his baseline at 1.5.  History of AVR-continue Coumadin  Scheduled Meds: . diphenhydrAMINE  25 mg Oral Once  . docusate sodium  100 mg Oral BID  . ferrous sulfate   325 mg Oral Q supper  . levothyroxine  125 mcg Oral QAC breakfast  . pantoprazole  40 mg Oral Once per day on Sun Tue Thu Sat  . sodium chloride flush  3 mL Intravenous Q12H  . warfarin  5 mg Oral Once per day on Sun  . warfarin  7.5 mg Oral Once per day on Mon Tue Wed Thu Fri Sat  . Warfarin - Pharmacist Dosing Inpatient   Does not apply q1600   Continuous Infusions: . cefTRIAXone (ROCEPHIN)  IV 1 g (02/28/20 1919)   PRN Meds:.acetaminophen **OR** acetaminophen, ondansetron **OR** ondansetron (ZOFRAN) IV  Diet Orders (From admission, onward)    Start     Ordered   02/28/20 0703  Diet regular Room service appropriate? Yes; Fluid consistency: Thin  Diet effective now       Question Answer Comment  Room service appropriate? Yes   Fluid consistency: Thin      02/28/20 0702          DVT prophylaxis:  warfarin (COUMADIN) tablet 7.5 mg  warfarin (COUMADIN) tablet 5 mg     Code Status: DNR  Family Communication: no family at bedside   Status is: Inpatient  Dispo:  Patient From: Home  Planned Disposition: Home  Expected discharge date: 02/29/20  Medically stable for discharge: No  Consultants:  None   Procedures:  none  Microbiology  Blood and urine cultures - pending  Antimicrobials: Ceftriaxone 6/26 >>    Objective: Vitals:   02/29/20 0124 02/29/20 0402 02/29/20 0812 02/29/20 1231  BP: (!) 169/64 (!) 144/60 138/63 (!) 152/64  Pulse: 95 90 85  94  Resp: '19 19 18 18  ' Temp: 98.1 F (36.7 C) 100.2 F (37.9 C) 99.7 F (37.6 C) (!) 101 F (38.3 C)  TempSrc: Oral Oral Oral Oral  SpO2: 96% 98% 98% 97%  Weight:  78.4 kg    Height:        Intake/Output Summary (Last 24 hours) at 02/29/2020 1332 Last data filed at 02/29/2020 1200 Gross per 24 hour  Intake 580 ml  Output 1525 ml  Net -945 ml   Filed Weights   02/27/20 2010 02/29/20 0402  Weight: 76.7 kg 78.4 kg    Examination:  Constitutional: No distress Eyes: No icterus ENMT: Moist mucous  membranes Neck: normal, supple Respiratory: Clear bilaterally without wheezing or crackles Cardiovascular: Regular rate and rhythm, 3/6 SEM with a mechanical click, no edema Abdomen: Soft, nontender, nondistended, positive bowel sounds Musculoskeletal: no clubbing / cyanosis.  Skin: No rashes seen Neurologic: Nonfocal, equal strength  Data Reviewed: I have independently reviewed following labs and imaging studies   CBC: Recent Labs  Lab 02/27/20 2105 02/28/20 0530 02/29/20 0418  WBC 12.3* 8.4 7.0  NEUTROABS 9.3* 5.9  --   HGB 9.7* 10.2* 10.7*  HCT 29.9* 30.7* 32.1*  MCV 106.8* 105.9* 103.5*  PLT 141* 127* 025*   Basic Metabolic Panel: Recent Labs  Lab 02/27/20 2105 02/28/20 0530 02/29/20 0418  NA 136  --  138  K 5.1  --  4.8  CL 108  --  111  CO2 18*  --  18*  GLUCOSE 152*  --  107*  BUN 31*  --  27*  CREATININE 1.94*  --  1.55*  CALCIUM 8.9  --  9.1  MG  --  1.8  --   PHOS  --  2.7  --    Liver Function Tests: Recent Labs  Lab 02/27/20 2105  AST 56*  ALT 23  ALKPHOS 63  BILITOT 1.9*  PROT 6.6  ALBUMIN 3.3*   Coagulation Profile: Recent Labs  Lab 02/27/20 2105 02/28/20 0530 02/29/20 0418  INR 2.6* 2.6* 2.5*   HbA1C: No results for input(s): HGBA1C in the last 72 hours. CBG: No results for input(s): GLUCAP in the last 168 hours.  Recent Results (from the past 240 hour(s))  Blood Culture (routine x 2)     Status: None (Preliminary result)   Collection Time: 02/27/20  9:05 PM   Specimen: BLOOD LEFT FOREARM  Result Value Ref Range Status   Specimen Description BLOOD LEFT FOREARM  Final   Special Requests   Final    BOTTLES DRAWN AEROBIC AND ANAEROBIC Blood Culture results may not be optimal due to an excessive volume of blood received in culture bottles   Culture   Final    NO GROWTH < 24 HOURS Performed at [facility], [place] [address], [place], [place] [address]    Report Status PENDING  Incomplete  Blood Culture (routine x 2)      Status: None (Preliminary result)   Collection Time: 02/27/20  9:05 PM   Specimen: BLOOD  Result Value Ref Range Status   Specimen Description BLOOD RIGHT ANTECUBITAL  Final   Special Requests   Final    BOTTLES DRAWN AEROBIC AND ANAEROBIC Blood Culture results may not be optimal due to an excessive volume of blood received in culture bottles   Culture   Final    NO GROWTH < 24 HOURS Performed at [facility], [place] [address], [place], [place] [address]  Report Status PENDING  Incomplete  Urine culture     Status: Abnormal (Preliminary result)   Collection Time: 02/27/20  9:05 PM   Specimen: In/Out Cath Urine  Result Value Ref Range Status   Specimen Description IN/OUT CATH URINE  Final   Special Requests   Final    NONE Performed at [facility], [place] [address] [place], [place] [address]    Culture >=100,000 COLONIES/mL GRAM NEGATIVE RODS (A)  Final   Report Status PENDING  Incomplete  SARS Coronavirus 2 by RT PCR (hospital order, performed in [facility] hospital lab) Nasopharyngeal Nasopharyngeal Swab     Status: None   Collection Time: 02/27/20  9:05 PM   Specimen: Nasopharyngeal Swab  Result Value Ref Range Status   SARS Coronavirus 2 NEGATIVE NEGATIVE Final    Comment: (NOTE) SARS-CoV-2 target nucleic acids are NOT DETECTED.  The SARS-CoV-2 RNA is generally detectable in upper and lower respiratory specimens during the acute phase of infection. The lowest concentration of SARS-CoV-2 viral copies this assay can detect is 250 copies / mL. A negative result does not preclude SARS-CoV-2 infection and should not be used as the sole basis for treatment or other patient management decisions.  A negative result may occur with improper specimen collection / handling, submission of specimen other than nasopharyngeal swab, presence of viral mutation(s) within the areas targeted by this assay, and inadequate number of viral copies (<250 copies / mL). A negative result  must be combined with clinical observations, patient history, and epidemiological information.  Fact Sheet for Patients:   StrictlyIdeas.no  Fact Sheet for Healthcare Providers: BankingDealers.co.za  This test is not yet approved or  cleared by the [place] FDA and has been authorized for detection and/or diagnosis of SARS-CoV-2 by FDA under an Emergency Use Authorization (EUA).  This EUA will remain in effect (meaning this test can be used) for the duration of the COVID-19 declaration under Section 564(b)(1) of the Act, 21 U.S.C. section 360bbb-3(b)(1), unless the authorization is terminated or revoked sooner.  Performed at [facility], [place] [address], [place], [place] [address]      Radiology Studies: No results found.  Marzetta Board, MD, PhD [facility]  Between 7 am - 7 pm I am available, please contact me via Amion or Securechat  Between 7 pm - 7 am I am not available, please contact night coverage MD/APP via Amion

## 2020-03-01 DIAGNOSIS — N390 Urinary tract infection, site not specified: Secondary | ICD-10-CM

## 2020-03-01 LAB — CBC
HCT: 28.6 % — ABNORMAL LOW (ref 39.0–52.0)
Hemoglobin: 9.5 g/dL — ABNORMAL LOW (ref 13.0–17.0)
MCH: 34.3 pg — ABNORMAL HIGH (ref 26.0–34.0)
MCHC: 33.2 g/dL (ref 30.0–36.0)
MCV: 103.2 fL — ABNORMAL HIGH (ref 80.0–100.0)
Platelets: 127 10*3/uL — ABNORMAL LOW (ref 150–400)
RBC: 2.77 MIL/uL — ABNORMAL LOW (ref 4.22–5.81)
RDW: 13.1 % (ref 11.5–15.5)
WBC: 4.4 10*3/uL (ref 4.0–10.5)
nRBC: 0 % (ref 0.0–0.2)

## 2020-03-01 LAB — URINE CULTURE: Culture: >=100000 — AB

## 2020-03-01 LAB — BASIC METABOLIC PANEL
Anion gap: 7 (ref 5–15)
BUN: 25 mg/dL — ABNORMAL HIGH (ref 8–23)
CO2: 20 mmol/L — ABNORMAL LOW (ref 22–32)
Calcium: 8.6 mg/dL — ABNORMAL LOW (ref 8.9–10.3)
Chloride: 108 mmol/L (ref 98–111)
Creatinine, Ser: 1.3 mg/dL — ABNORMAL HIGH (ref 0.61–1.24)
GFR calc Af Amer: 58 mL/min — ABNORMAL LOW (ref >60)
GFR calc non Af Amer: 50 mL/min — ABNORMAL LOW (ref >60)
Glucose, Bld: 105 mg/dL — ABNORMAL HIGH (ref 70–99)
Potassium: 4.1 mmol/L (ref 3.5–5.1)
Sodium: 135 mmol/L (ref 135–145)

## 2020-03-01 LAB — PROTIME-INR
INR: 2.4 — ABNORMAL HIGH (ref 0.8–1.2)
Prothrombin Time: 25.5 seconds — ABNORMAL HIGH (ref 11.4–15.2)

## 2020-03-01 MED ORDER — AMOXICILLIN-POT CLAVULANATE 875-125 MG PO TABS: 1.0000 | ORAL_TABLET | Freq: Two times a day (BID) | ORAL | 0 refills | Status: AC

## 2020-03-01 NOTE — Progress Notes (Signed)
[place] for Coumadin Indication: AVR/Afib  Allergies  Allergen Reactions  . Carvedilol Shortness Of Breath    wheezing  . Lopid [Gemfibrozil] Other (See Comments)    transaminitis  . Monopril [Fosinopril] Other (See Comments)    transaminitis  . Vicodin [Hydrocodone-Acetaminophen]     Severe sensitivity  . Phenergan [Promethazine Hcl] Other (See Comments)    Severe somnolence.  . Zocor [Simvastatin]     Short memory loss.  [name] [Olmesartan]     Abdominal cramping and increased stools/ irritable bowel  . Codeine Nausea And Vomiting  . Prilosec [Omeprazole]     dyspepsia    Patient Measurements: Height: 5\' 10"  (177.8 cm) Weight: 78 kg (172 lb) (scale b) IBW/kg (Calculated) : 73  Vital Signs: Temp: 99 F (37.2 C) (06/29 0430) Temp Source: Oral (06/29 0430) BP: 112/59 (06/29 0430) Pulse Rate: 66 (06/29 0430)  Labs: Recent Labs    02/27/20 2105 02/27/20 2105 02/28/20 0530 02/28/20 0530 02/29/20 0418 03/01/20 0341  HGB 9.7*   < > 10.2*   < > 10.7* 9.5*  HCT 29.9*   < > 30.7*  --  32.1* 28.6*  PLT 141*   < > 127*  --  141* 127*  APTT 50*  --   --   --   --   --   LABPROT 26.9*   < > 27.1*  --  26.1* 25.5*  INR 2.6*   < > 2.6*  --  2.5* 2.4*  CREATININE 1.94*  --   --   --  1.55* 1.30*   < > = values in this interval not displayed.    Estimated Creatinine Clearance: 42.9 mL/min (A) (by C-G formula based on SCr of 1.3 mg/dL (H)).   Medical History: Past Medical History:  Diagnosis Date  . Anticoagulation adequate 05/23/2019  . Atrial fibrillation ([place])   . CAD (coronary artery disease) of artery bypass graft 2000   2000 with multiple bypasses   . Chronic anticoagulation    Coumadin therapy for mechanical aortic valve. Postoperative atrial fibrillation.   . Essential hypertension   . H/O mechanical aortic valve replacement 2000   [employer] Jude AVR  . Hypothyroidism   . Premature ventricular contractions   . S/P  ablation of atrial fibrillation 05/22/19 05/23/2019  . Typical atrial flutter ([place]) 05/23/2019    Assessment: 86yo male c/o fever and frequent urination, admitted for urosepsis, to continue Coumadin for AVR/Afib; current INR at goal w/ last dose taken 6/25.  INR 2.4 therapeutic on home regimen, chronic anemia stable, plts 127.    Goal of Therapy:  INR 2-3  (per records pt had higher goal but was decreased for h/o bleed)   Plan:  Continue home Coumadin dose of 7.5mg  daily except 5mg  Sun and monitor INR for dose adjustments. Daily INR, s/s bleeding  Bertis Ruddy, PharmD Clinical Pharmacist Please check AMION for all [place] numbers 03/01/2020 7:57 AM

## 2020-03-01 NOTE — Discharge Summary (Signed)
Physician Discharge Summary  [name] ZPH:[id number] DOB: Dec 04, 1931 DOA: 02/27/2020  PCP: [name], MD  Admit date: 02/27/2020 Discharge date: 03/01/2020  Admitted From: home Disposition:  home  Recommendations for Outpatient Follow-up:  1. Follow up with PCP in 1-2 weeks 2. Continue Augmentin for 1 week  Home Health: PT Equipment/Devices: none  Discharge Condition: stable CODE STATUS: DNR Diet recommendation: heart healthy  HPI: Per admitting MD, Jason Santana is a 86 y.o. male with medical history significant of hypertension, CAD status post CABG, atrial fibrillation status post ablation in September 2020, mechanical [place] AVR on Coumadin, GI bleed in 2019, hypothyroidism Presented with fever and frequent urination fever up to 101 in the similar presentation the past 5 UTIs Denies any melena, no blood in stool He has hemorrhoids  Reports fever today up to 103 Patient has difficult to manage hypertension followed by cardiology  Hospital Course / Discharge diagnoses: Principal Problem Sesis due to urinary tract infectionp-met criteria with elevated WBC, fever, as well as acute kidney injury.   Patient was placed on ceftriaxone and clinically improved.  His sepsis physiology resolved, fever curve improved and he is afebrile.  His WBC has normalized.  His weakness has improved.  Patient initially had poor p.o. intake due to nausea however today his appetite has returned to normal, no longer has nausea.  His urine culture speciated E. coli which is pansensitive, he will be transitioned to Augmentin for 7 additional days for complicated UTI.  His blood cultures have remained negative, final results still pending.  Active Problems Anemia of likely chronic kidney disease-anemia panel pending fairly unremarkable, hemoglobin stable and there is no evidence of bleed Paroxysmal a flutter-currently anticoagulated with Coumadin Essential hypertension-resume home medications CAD  with history of CABG-continue aspirin, no chest pain and this appears stable Hypothyroidism-stable, continue Synthroid Acute kidney injury on chronic kidney disease stage IIIb-Baseline creatinine 1.2-1.6, improved after IV fluids and returned to baseline.  Outpatient follow-up, advised blood work in a week History of AVR-continue Coumadin  Discharge Instructions   Allergies as of 03/01/2020      Reactions   Carvedilol Shortness Of Breath   wheezing   Lopid [gemfibrozil] Other (See Comments)   transaminitis   Monopril [fosinopril] Other (See Comments)   transaminitis   Vicodin [hydrocodone-acetaminophen]    Severe sensitivity   Phenergan [promethazine Hcl] Other (See Comments)   Severe somnolence.   Zocor [simvastatin]    Short memory loss.   Benicar [olmesartan]    Abdominal cramping and increased stools/ irritable bowel   Codeine Nausea And Vomiting   Prilosec [omeprazole]    dyspepsia      Medication List    STOP taking these medications   sulfamethoxazole-trimethoprim 800-160 MG tablet Commonly known as: BACTRIM DS     TAKE these medications   amoxicillin-clavulanate 875-125 MG tablet Commonly known as: Augmentin Take 1 tablet by mouth every 12 (twelve) hours for 7 days.   diltiazem 120 MG 24 hr capsule Commonly known as: CARDIZEM CD Take 1 capsule (120 mg total) by mouth daily.   diltiazem 30 MG tablet Commonly known as: CARDIZEM TAKE 1 TABLET AS NEEDED FOR PALPITATIONS OR HEART RATE >100 What changed: See the new instructions.   diphenhydramine-acetaminophen 25-500 MG Tabs tablet Commonly known as: TYLENOL PM Take 0.5 tablets by mouth at bedtime as needed (sleep).   ferrous sulfate 325 (65 FE) MG tablet Take 325 mg by mouth daily with supper.   furosemide 20 MG tablet  Commonly known as: LASIX Take 20 mg by mouth daily.   levothyroxine 125 MCG tablet Commonly known as: SYNTHROID Take 125 mcg by mouth daily before breakfast.   pantoprazole 40 MG  tablet Commonly known as: PROTONIX Take 40 mg by mouth 4 (four) times a week.   PRESERVISION AREDS 2 PO Take 1 capsule by mouth 2 (two) times daily.   spironolactone 25 MG tablet Commonly known as: Aldactone Take 1 tablet (25 mg total) by mouth daily.   SYSTANE OP Place 1 drop into both eyes daily as needed (dry eyes).   tobramycin 0.3 % ophthalmic solution Commonly known as: TOBREX Place 1 drop into the left eye See admin instructions. Instill 1 drop into left eye 1 day before, the day of, and the day after eye injections administered at Dr's office   valsartan 320 MG tablet Commonly known as: DIOVAN Take 1 tablet (320 mg total) by mouth daily.   Vitamin D 50 MCG (2000 UT) tablet Take 2,000 Units by mouth daily.   warfarin 7.5 MG tablet Commonly known as: COUMADIN Take 5-7.5 mg by mouth See admin instructions. Per patient taking 55m on Sundays       Follow-up Information    GJosetta Huddle MD. Schedule an appointment as soon as possible for a visit in 1 week(s).   Specialty: Internal Medicine Contact information: [address], [address] [place] [phone]        SBelva Crome MD .   Specialty: Cardiology Contact information: [phone]. [phone]        CConstance Haw MD .   Specialty: Cardiology Contact information: [place] [phone]              Consultations:  None   Procedures/Studies:  DG Chest Port 1 View  Result Date: 02/27/2020 CLINICAL DATA:  Sepsis EXAM: PORTABLE CHEST 1 VIEW COMPARISON:  04/10/2019 FINDINGS: Single frontal view of the chest demonstrates a stable cardiac silhouette with postsurgical changes from median sternotomy. No airspace disease, effusion, or pneumothorax. No acute bony abnormalities. IMPRESSION: 1. No acute intrathoracic process. Electronically Signed   By: MRanda NgoM.D.   On: 02/27/2020  20:51     Subjective: - no chest pain, shortness of breath, no abdominal pain, nausea or vomiting.   Discharge Exam: BP 139/61 (BP Location: Left Arm)   Pulse 78   Temp 98 F (36.7 C) (Oral)   Resp 18   Ht '5\' 10"'  (1.778 m)   Wt 78 kg Comment: scale b  SpO2 99%   BMI 24.68 kg/m   General: Pt is alert, awake, not in acute distress Cardiovascular: RRR, S1/S2 +, no rubs, no gallops Respiratory: CTA bilaterally, no wheezing, no rhonchi Abdominal: Soft, NT, ND, bowel sounds + Extremities: no edema, no cyanosis    The results of significant diagnostics from this hospitalization (including imaging, microbiology, ancillary and laboratory) are listed below for reference.     Microbiology: Recent Results (from the past 240 hour(s))  Blood Culture (routine x 2)     Status: None (Preliminary result)   Collection Time: 02/27/20  9:05 PM   Specimen: BLOOD LEFT FOREARM  Result Value Ref Range Status   Specimen Description BLOOD LEFT FOREARM  Final   Special Requests   Final    BOTTLES DRAWN AEROBIC AND ANAEROBIC Blood Culture results may not be optimal due to an excessive volume of  blood received in culture bottles   Culture   Final    NO GROWTH 3 DAYS Performed at [facility], [place] [address], [place], [place] [address]    Report Status PENDING  Incomplete  Blood Culture (routine x 2)     Status: None (Preliminary result)   Collection Time: 02/27/20  9:05 PM   Specimen: BLOOD  Result Value Ref Range Status   Specimen Description BLOOD RIGHT ANTECUBITAL  Final   Special Requests   Final    BOTTLES DRAWN AEROBIC AND ANAEROBIC Blood Culture results may not be optimal due to an excessive volume of blood received in culture bottles   Culture   Final    NO GROWTH 3 DAYS Performed at [facility], [place] [address], [place], [place] [address]    Report Status PENDING  Incomplete  Urine culture     Status: Abnormal   Collection Time: 02/27/20  9:05 PM   Specimen: In/Out  Cath Urine  Result Value Ref Range Status   Specimen Description IN/OUT CATH URINE  Final   Special Requests   Final    NONE Performed at [facility], [place] [address], [place],  [address]    Culture >=100,000 COLONIES/mL ESCHERICHIA COLI (A)  Final   Report Status 03/01/2020 FINAL  Final   Organism ID, Bacteria ESCHERICHIA COLI (A)  Final      Susceptibility   Escherichia coli - MIC*    AMPICILLIN 8 SENSITIVE Sensitive     CEFAZOLIN <=4 SENSITIVE Sensitive     CEFTRIAXONE <=0.25 SENSITIVE Sensitive     CIPROFLOXACIN <=0.25 SENSITIVE Sensitive     GENTAMICIN <=1 SENSITIVE Sensitive     IMIPENEM <=0.25 SENSITIVE Sensitive     NITROFURANTOIN <=16 SENSITIVE Sensitive     TRIMETH/SULFA <=20 SENSITIVE Sensitive     AMPICILLIN/SULBACTAM <=2 SENSITIVE Sensitive     PIP/TAZO <=4 SENSITIVE Sensitive     * >=100,000 COLONIES/mL ESCHERICHIA COLI  SARS Coronavirus 2 by RT PCR (hospital order, performed in [facility] hospital lab) Nasopharyngeal Nasopharyngeal Swab     Status: None   Collection Time: 02/27/20  9:05 PM   Specimen: Nasopharyngeal Swab  Result Value Ref Range Status   SARS Coronavirus 2 NEGATIVE NEGATIVE Final    Comment: (NOTE) SARS-CoV-2 target nucleic acids are NOT DETECTED.  The SARS-CoV-2 RNA is generally detectable in upper and lower respiratory specimens during the acute phase of infection. The lowest concentration of SARS-CoV-2 viral copies this assay can detect is 250 copies / mL. A negative result does not preclude SARS-CoV-2 infection and should not be used as the sole basis for treatment or other patient management decisions.  A negative result may occur with improper specimen collection / handling, submission of specimen other than nasopharyngeal swab, presence of viral mutation(s) within the areas targeted by this assay, and inadequate number of viral copies (<250 copies / mL). A negative result must be combined with clinical observations, patient  history, and epidemiological information.  Fact Sheet for Patients:   StrictlyIdeas.no  Fact Sheet for Healthcare Providers: BankingDealers.co.za  This test is not yet approved or  cleared by the [place] FDA and has been authorized for detection and/or diagnosis of SARS-CoV-2 by FDA under an Emergency Use Authorization (EUA).  This EUA will remain in effect (meaning this test can be used) for the duration of the COVID-19 declaration under Section 564(b)(1) of the Act, 21 U.S.C. section 360bbb-3(b)(1), unless the authorization is terminated or  revoked sooner.  Performed at [facility], [place] [address], [place], [place] [address]      Labs: Basic Metabolic Panel: Recent Labs  Lab 02/27/20 2105 02/28/20 0530 02/29/20 0418 03/01/20 0341  NA 136  --  138 135  K 5.1  --  4.8 4.1  CL 108  --  111 108  CO2 18*  --  18* 20*  GLUCOSE 152*  --  107* 105*  BUN 31*  --  27* 25*  CREATININE 1.94*  --  1.55* 1.30*  CALCIUM 8.9  --  9.1 8.6*  MG  --  1.8  --   --   PHOS  --  2.7  --   --    Liver Function Tests: Recent Labs  Lab 02/27/20 2105  AST 56*  ALT 23  ALKPHOS 63  BILITOT 1.9*  PROT 6.6  ALBUMIN 3.3*   CBC: Recent Labs  Lab 02/27/20 2105 02/28/20 0530 02/29/20 0418 03/01/20 0341  WBC 12.3* 8.4 7.0 4.4  NEUTROABS 9.3* 5.9  --   --   HGB 9.7* 10.2* 10.7* 9.5*  HCT 29.9* 30.7* 32.1* 28.6*  MCV 106.8* 105.9* 103.5* 103.2*  PLT 141* 127* 141* 127*   CBG: No results for input(s): GLUCAP in the last 168 hours. Hgb A1c No results for input(s): HGBA1C in the last 72 hours. Lipid Profile No results for input(s): CHOL, HDL, LDLCALC, TRIG, CHOLHDL, LDLDIRECT in the last 72 hours. Thyroid function studies Recent Labs    02/28/20 0530  TSH 0.253*   Urinalysis    Component Value Date/Time   COLORURINE YELLOW 02/27/2020 2105   APPEARANCEUR HAZY (A) 02/27/2020 2105   LABSPEC 1.017 02/27/2020 2105    PHURINE 5.0 02/27/2020 2105   GLUCOSEU NEGATIVE 02/27/2020 2105   HGBUR NEGATIVE 02/27/2020 2105   BILIRUBINUR NEGATIVE 02/27/2020 2105   KETONESUR NEGATIVE 02/27/2020 2105   PROTEINUR NEGATIVE 02/27/2020 2105   NITRITE POSITIVE (A) 02/27/2020 2105   LEUKOCYTESUR LARGE (A) 02/27/2020 2105    FURTHER DISCHARGE INSTRUCTIONS:   Get Medicines reviewed and adjusted: Please take all your medications with you for your next visit with your Primary MD   Laboratory/radiological data: Please request your Primary MD to go over all hospital tests and procedure/radiological results at the follow up, please ask your Primary MD to get all Hospital records sent to his/her office.   In some cases, they will be blood work, cultures and biopsy results pending at the time of your discharge. Please request that your primary care M.D. goes through all the records of your hospital data and follows up on these results.   Also Note the following: If you experience worsening of your admission symptoms, develop shortness of breath, life threatening emergency, suicidal or homicidal thoughts you must seek medical attention immediately by calling 911 or calling your MD immediately  if symptoms less severe.   You must read complete instructions/literature along with all the possible adverse reactions/side effects for all the Medicines you take and that have been prescribed to you. Take any new Medicines after you have completely understood and accpet all the possible adverse reactions/side effects.    Do not drive when taking Pain medications or sleeping medications (Benzodaizepines)   Do not take more than prescribed Pain, Sleep and Anxiety Medications. It is not advisable to combine anxiety,sleep and pain medications without talking with your primary care practitioner   Special Instructions: If you have smoked or chewed Tobacco  in the last 2 yrs  please stop smoking, stop any regular Alcohol  and or any Recreational  drug use.   Wear Seat belts while driving.   Please note: You were cared for by a hospitalist during your hospital stay. Once you are discharged, your primary care physician will handle any further medical issues. Please note that NO REFILLS for any discharge medications will be authorized once you are discharged, as it is imperative that you return to your primary care physician (or establish a relationship with a primary care physician if you do not have one) for your post hospital discharge needs so that they can reassess your need for medications and monitor your lab values.  Time coordinating discharge: 35 minutes  SIGNED:  Marzetta Board, MD, PhD 03/01/2020, 1:50 PM

## 2020-03-01 NOTE — Progress Notes (Signed)
Patient alert and oriented, denies pain VSS, tele and iv removed, d/c instruction explained and given to the patient all questions answered. Patient d/c home per order.

## 2020-03-03 ENCOUNTER — Telehealth: Payer: Self-pay | Admitting: Pharmacist

## 2020-03-03 LAB — CULTURE, BLOOD (ROUTINE X 2)
Culture: NO GROWTH
Culture: NO GROWTH

## 2020-03-03 NOTE — Telephone Encounter (Signed)
Pt left message regarding low blood pressure.  Called patient back, he reports HR has gone up and BP has gone down. BP today was 86/47, HR has increased from 60s to 80s. He was working in the garage this morning, BP after was 93/44. Has checked BP 4x today and systolic BP < 138 in each case. States he has been staying hydrated with water. He was discharged from the hospital yesterday for sepsis secondary to UTI.  Advised pt that HR likely increased as a response to his low BP. Will stop spironolactone 25mg  daily and continue diltiazem and valsartan for now. He will keep a close eye on BP and call us again in a few days to let us know how his BP looks. If BP remains low, may need dose decrease of valsartan as well.

## 2020-03-04 DIAGNOSIS — H6120 Impacted cerumen, unspecified ear: Secondary | ICD-10-CM | POA: Diagnosis not present

## 2020-03-04 DIAGNOSIS — H6123 Impacted cerumen, bilateral: Secondary | ICD-10-CM | POA: Diagnosis not present

## 2020-03-04 DIAGNOSIS — A4151 Sepsis due to Escherichia coli [E. coli]: Secondary | ICD-10-CM | POA: Diagnosis not present

## 2020-03-24 DIAGNOSIS — Z7901 Long term (current) use of anticoagulants: Secondary | ICD-10-CM | POA: Diagnosis not present

## 2020-03-24 DIAGNOSIS — A4151 Sepsis due to Escherichia coli [E. coli]: Secondary | ICD-10-CM | POA: Diagnosis not present

## 2020-04-25 DIAGNOSIS — Z7901 Long term (current) use of anticoagulants: Secondary | ICD-10-CM | POA: Diagnosis not present

## 2020-05-11 DIAGNOSIS — Z7901 Long term (current) use of anticoagulants: Secondary | ICD-10-CM | POA: Diagnosis not present

## 2020-05-11 DIAGNOSIS — I2510 Atherosclerotic heart disease of native coronary artery without angina pectoris: Secondary | ICD-10-CM | POA: Diagnosis not present

## 2020-05-11 DIAGNOSIS — L309 Dermatitis, unspecified: Secondary | ICD-10-CM | POA: Diagnosis not present

## 2020-05-11 DIAGNOSIS — E039 Hypothyroidism, unspecified: Secondary | ICD-10-CM | POA: Diagnosis not present

## 2020-05-11 DIAGNOSIS — Z1389 Encounter for screening for other disorder: Secondary | ICD-10-CM | POA: Diagnosis not present

## 2020-05-11 DIAGNOSIS — Z23 Encounter for immunization: Secondary | ICD-10-CM | POA: Diagnosis not present

## 2020-05-11 DIAGNOSIS — Z0000 Encounter for general adult medical examination without abnormal findings: Secondary | ICD-10-CM | POA: Diagnosis not present

## 2020-05-11 DIAGNOSIS — I10 Essential (primary) hypertension: Secondary | ICD-10-CM | POA: Diagnosis not present

## 2020-05-11 DIAGNOSIS — I4891 Unspecified atrial fibrillation: Secondary | ICD-10-CM | POA: Diagnosis not present

## 2020-05-11 DIAGNOSIS — E782 Mixed hyperlipidemia: Secondary | ICD-10-CM | POA: Diagnosis not present

## 2020-05-11 DIAGNOSIS — D6869 Other thrombophilia: Secondary | ICD-10-CM | POA: Diagnosis not present

## 2020-05-11 DIAGNOSIS — R600 Localized edema: Secondary | ICD-10-CM | POA: Diagnosis not present

## 2020-05-11 DIAGNOSIS — Z952 Presence of prosthetic heart valve: Secondary | ICD-10-CM | POA: Diagnosis not present

## 2020-05-11 DIAGNOSIS — E559 Vitamin D deficiency, unspecified: Secondary | ICD-10-CM | POA: Diagnosis not present

## 2020-05-11 DIAGNOSIS — N183 Chronic kidney disease, stage 3 unspecified: Secondary | ICD-10-CM | POA: Diagnosis not present

## 2020-05-11 DIAGNOSIS — H3530 Unspecified macular degeneration: Secondary | ICD-10-CM | POA: Diagnosis not present

## 2020-05-12 ENCOUNTER — Other Ambulatory Visit: Payer: Self-pay

## 2020-05-12 ENCOUNTER — Encounter: Payer: Self-pay | Admitting: Cardiology

## 2020-05-12 ENCOUNTER — Ambulatory Visit (INDEPENDENT_AMBULATORY_CARE_PROVIDER_SITE_OTHER): Payer: Medicare Other | Admitting: Cardiology

## 2020-05-12 VITALS — BP 135/60 | HR 75 | Ht 70.0 in | Wt 176.6 lb

## 2020-05-12 DIAGNOSIS — I712 Thoracic aortic aneurysm, without rupture, unspecified: Secondary | ICD-10-CM

## 2020-05-12 DIAGNOSIS — I4819 Other persistent atrial fibrillation: Secondary | ICD-10-CM

## 2020-05-12 NOTE — Progress Notes (Addendum)
Electrophysiology Office Note   Date:  05/12/2020   ID:  Jason Santana, DOB 04-25-1932, MRN [id number]  PCP:  [name], MD  Cardiologist:  Tamala Julian Primary Electrophysiologist:  Jayel Scaduto Meredith Leeds, MD    No chief complaint on file.    History of Present Illness: Jason Santana is a 86 y.o. male who is being seen today for the evaluation of atrial fibrillation at the request of [name], MD. Presenting today for electrophysiology evaluation.  He has a history of mechanical aortic valve replacement in 2000, atrial fibrillation and atrial flutter, coronary artery disease.  He had a GI bleed May 2019 requiring a pause and anticoagulation.    His atrial fibrillation symptoms include lightheadedness and fatigue.  He was started on amiodarone in December 2018.  Emergency room 04/10/2019 with palpitations.  Was found to be in atrial fibrillation.  Cardioversion to sinus rhythm.  He is now status post AF ablation 05/22/2019.    Today, denies symptoms of palpitations, chest pain, shortness of breath, orthopnea, PND, lower extremity edema, claudication, dizziness, presyncope, syncope, bleeding, or neurologic sequela. The patient is tolerating medications without difficulties.  He was admitted to the hospital June 2021 with sepsis of urinary origin.  He fortunately remains in sinus rhythm throughout his hospitalization.  Since last being seen he is done well.  He has no chest pain or shortness of breath.  He is able do all of the daily activities.  He feels a few early beats, but no prolonged episodes of atrial fibrillation.  He is able to exercise and exert himself without issue.  He has done well since his hospitalization for UTI and sepsis.   Past Medical History:  Diagnosis Date   Anticoagulation adequate 05/23/2019   Atrial fibrillation ([place])    CAD (coronary artery disease) of artery bypass graft 2000   2000 with multiple bypasses    Chronic anticoagulation    Coumadin therapy for  mechanical aortic valve. Postoperative atrial fibrillation.    Essential hypertension    H/O mechanical aortic valve replacement 2000   [employer] AVR   Hypothyroidism    Premature ventricular contractions    S/P ablation of atrial fibrillation 05/22/19 05/23/2019   Typical atrial flutter ([place]) 05/23/2019   Past Surgical History:  Procedure Laterality Date   AORTIC VALVE REPLACEMENT (AVR)/CORONARY ARTERY BYPASS GRAFTING (CABG)  2000   ATRIAL FIBRILLATION ABLATION N/A 05/22/2019   Procedure: ATRIAL FIBRILLATION ABLATION;  Surgeon: Constance Haw, MD;  Location: [place] [facility];  Service: Cardiovascular;  Laterality: N/A;   CARDIOVERSION N/A 02/24/2018   Procedure: CARDIOVERSION;  Surgeon: Fay Records, MD;  Location: [facility] ENDOSCOPY;  Service: Cardiovascular;  Laterality: N/A;   CARDIOVERSION N/A 08/15/2018   Procedure: CARDIOVERSION;  Surgeon: Thayer Headings, MD;  Location: [place];  Service: Cardiovascular;  Laterality: N/A;   COLONOSCOPY WITH PROPOFOL N/A 01/08/2018   Procedure: COLONOSCOPY WITH PROPOFOL;  Surgeon: Arta Silence, MD;  Location: [facility] ENDOSCOPY;  Service: Gastroenterology;  Laterality: N/A;   CORONARY/GRAFT ANGIOGRAPHY N/A 08/14/2018   Procedure: CORONARY/GRAFT ANGIOGRAPHY;  Surgeon: [name], MD;  Location: [place] [facility];  Service: Cardiovascular;  Laterality: N/A;   ESOPHAGOGASTRODUODENOSCOPY (EGD) WITH PROPOFOL N/A 01/06/2018   Procedure: ESOPHAGOGASTRODUODENOSCOPY (EGD) WITH PROPOFOL;  Surgeon: Otis Brace, MD;  Location: [facility] ENDOSCOPY;  Service: Gastroenterology;  Laterality: N/A;     Current Outpatient Medications  Medication Sig Dispense Refill   Cholecalciferol (VITAMIN D) 2000 units tablet Take 2,000 Units by mouth daily.  diltiazem (CARDIZEM CD) 120 MG 24 hr capsule Take 1 capsule (120 mg total) by mouth daily. 90 capsule 3   diltiazem (CARDIZEM) 30 MG tablet TAKE 1 TABLET AS NEEDED FOR PALPITATIONS OR HEART RATE >100 30  tablet 6   diphenhydramine-acetaminophen (TYLENOL PM) 25-500 MG TABS tablet Take 0.5 tablets by mouth at bedtime as needed (sleep).     ferrous sulfate 325 (65 FE) MG tablet Take 325 mg by mouth daily with supper.     furosemide (LASIX) 20 MG tablet Take 20 mg by mouth daily.     levothyroxine (SYNTHROID) 125 MCG tablet Take 125 mcg by mouth daily before breakfast.      Multiple Vitamins-Minerals (PRESERVISION AREDS 2 PO) Take 1 capsule by mouth 2 (two) times daily.     pantoprazole (PROTONIX) 40 MG tablet Take 40 mg by mouth 4 (four) times a week.     Polyethyl Glycol-Propyl Glycol (SYSTANE OP) Place 1 drop into both eyes daily as needed (dry eyes).     tobramycin (TOBREX) 0.3 % ophthalmic solution Place 1 drop into the left eye See admin instructions. Instill 1 drop into left eye 1 day before, the day of, and the day after eye injections administered at Dr's office     valsartan (DIOVAN) 320 MG tablet Take 1 tablet (320 mg total) by mouth daily. 90 tablet 3   warfarin (COUMADIN) 7.5 MG tablet Take 5-7.5 mg by mouth See admin instructions. Per patient taking 5mg  on Sundays      No current facility-administered medications for this visit.    Allergies:   Carvedilol, Lopid [gemfibrozil], Monopril [fosinopril], Vicodin [hydrocodone-acetaminophen], Phenergan [promethazine hcl], Zocor [simvastatin], Benicar [olmesartan], Codeine, and Prilosec [omeprazole]   Social History:  The patient  reports that he has quit smoking. His smoking use included cigarettes. He has never used smokeless tobacco. He reports that he does not drink alcohol and does not use drugs.   Family History:  The patient's family history includes Heart attack in his mother; Hypertension in his father and mother.   ROS:  Please see the history of present illness.   Otherwise, review of systems is positive for none.   All other systems are reviewed and negative.   PHYSICAL EXAM: VS:  BP 135/60    Pulse 75    Ht 5\' 10"   (1.778 m)    Wt 176 lb 9.6 oz (80.1 kg)    SpO2 98%    BMI 25.34 kg/m  , BMI Body mass index is 25.34 kg/m. GEN: Well nourished, well developed, in no acute distress  HEENT: normal  Neck: no JVD, carotid bruits, or masses Cardiac: RRR; no murmurs, rubs, or gallops,no edema  Respiratory:  clear to auscultation bilaterally, normal work of breathing GI: soft, nontender, nondistended, + BS MS: no deformity or atrophy  Skin: warm and dry Neuro:  Strength and sensation are intact Psych: euthymic mood, full affect  EKG:  EKG is ordered today. Personal review of the ekg ordered shows sinus rhythm, IVCD, rate 70  Recent Labs: 02/27/2020: ALT 23 02/28/2020: Magnesium 1.8; TSH 0.253 03/01/2020: BUN 25; Creatinine, Ser 1.30; Hemoglobin 9.5; Platelets 127; Potassium 4.1; Sodium 135    Lipid Panel     Component Value Date/Time   CHOL 130 04/15/2019 0905   TRIG 157 (H) 04/15/2019 0905   HDL 30 (L) 04/15/2019 0905   CHOLHDL 4.3 04/15/2019 0905   CHOLHDL 7.7 08/12/2018 0225   VLDL 48 (H) 08/12/2018 0225   LDLCALC 69 04/15/2019 0905  Wt Readings from Last 3 Encounters:  05/12/20 176 lb 9.6 oz (80.1 kg)  03/01/20 172 lb (78 kg)  11/19/19 180 lb 12.8 oz (82 kg)      Other studies Reviewed: Additional studies/ records that were reviewed today include: TTE 08/12/18  Review of the above records today demonstrates:  - Left ventricle: The cavity size was normal. There was mild   concentric hypertrophy. Systolic function was normal. The   estimated ejection fraction was in the range of 55% to 60%. Wall   motion was normal; there were no regional wall motion   abnormalities. - Ventricular septum: Septal motion showed paradox. These changes   are consistent with a post-thoracotomy state. - Aortic valve: A mechanical prosthesis was present and functioning   normally. - Mitral valve: Calcified annulus. There was mild regurgitation. - Left atrium: The atrium was mildly dilated. - Right  ventricle: Systolic function was mildly to moderately   reduced. - Right atrium: The atrium was mildly dilated.  LHC 08/14/18  Mid RCA lesion is 100% stenosed.  Ost 2nd Mrg lesion is 100% stenosed.  Prox LAD to Mid LAD lesion is 50% stenosed.  SVG.  The graft exhibits mild .  1.  Severe two-vessel coronary artery disease with total occlusion of the OM 2 and the mid RCA, nonobstructive LAD stenosis 2.  Status post remote aortocoronary bypass surgery with continued patency of the LIMA to LAD, saphenous vein graft OM 2, and saphenous vein graft to PDA  ASSESSMENT AND PLAN:  1.  Persistent atrial fibrillation: Currently on warfarin.  Status post ablation 05/22/2019.  CHA2DS2-VASc 4.  He is currently felt well and is noted no further episodes of atrial fibrillation.   2.  Mechanical aortic valve: Stable on most recent echo.  Plan per primary cardiology.    3.  Coronary artery disease: No current chest pain.  4.  Hypertension: Currently well controlled  5.  Aortic aneurysm: Found on CT scanning prior to his ablation.  Wafaa Deemer repeat CT scan as it has been 1 year.  Case discussed with primary cardiology  Current medicines are reviewed at length with the patient today.   The patient does not have concerns regarding his medicines.  The following changes were made today: None  Labs/ tests ordered today include:  Orders Placed This Encounter  Procedures   CT ANGIO CHEST AORTA W/CM & OR WO/CM   EKG 12-Lead    Disposition:   FU with Jiana Lemaire six months  Signed, Alishia Lebo Meredith Leeds, MD  05/12/2020 1:36 PM     [facility] [address] [place] [address] [phone] (office) [phone] (fax)

## 2020-05-12 NOTE — Patient Instructions (Signed)
Medication Instructions:  Your physician recommends that you continue on your current medications as directed. Please refer to the Current Medication list given to you today.  *If you need a refill on your cardiac medications before your next appointment, please call your pharmacy*   Lab Work: None ordered If you have labs (blood work) drawn today and your tests are completely normal, you will receive your results only by: [name] MyChart Message (if you have MyChart) OR . A paper copy in the mail If you have any lab test that is abnormal or we need to change your treatment, we will call you to review the results.   Testing/Procedures: Your physician has requested that you have cardiac CT. Cardiac computed tomography (CT) is a painless test that uses an x-ray machine to take clear, detailed pictures of your heart. Please follow instruction sheet as given.   Follow-Up: At [facility], you and your health needs are our priority.  As part of our continuing mission to provide you with exceptional heart care, we have created designated Provider Care Teams.  These Care Teams include your primary Cardiologist (physician) and Advanced Practice Providers (APPs -  Physician Assistants and Nurse Practitioners) who all work together to provide you with the care you need, when you need it.  We recommend signing up for the patient portal called "MyChart".  Sign up information is provided on this After Visit Summary.  MyChart is used to connect with patients for Virtual Visits (Telemedicine).  Patients are able to view lab/test results, encounter notes, upcoming appointments, etc.  Non-urgent messages can be sent to your provider as well.   To learn more about what you can do with MyChart, go to [email or web address].    Your next appointment:   6 month(s)  The format for your next appointment:   In Person  Provider:   Allegra Lai, MD   Thank you for choosing [facility]!!   Trinidad Curet,  RN [phone]    Other Instructions  Your cardiac CT will be scheduled at one of the below locations:   [facility] [address] [place], [place] [address] [phone]  [facility] [address] [place],  [address] [phone]  If scheduled at [facility], please arrive at the [facility] main entrance of [facility] 30 minutes prior to test start time. Proceed to the [facility] Radiology Department (first floor) to check-in and test prep.  If scheduled at [facility], please arrive 15 mins early for check-in and test prep.  Please follow these instructions carefully (unless otherwise directed):  Hold all erectile dysfunction medications at least 3 days (72 hrs) prior to test.  On the Night Before the Test: . Be sure to Drink plenty of water. . Do not consume any caffeinated/decaffeinated beverages or chocolate 12 hours prior to your test. . Do not take any antihistamines 12 hours prior to your test.   On the Day of the Test: . Drink plenty of water. Do not drink any water within one hour of the test. . Do not eat any food 4 hours prior to the test. . You may take your regular medications prior to the test.  . Take metoprolol (Lopressor) two hours prior to test. . HOLD Furosemide/Hydrochlorothiazide morning of the test.       After the Test: . Drink plenty of water. . After receiving IV contrast, you may experience a mild  flushed feeling. This is normal. . On occasion, you may experience a mild rash up to 24 hours after the test. This is not dangerous. If this occurs, you can take Benadryl 25 mg and increase your fluid intake. . If you experience trouble breathing, this can be serious. If it is severe call 911 IMMEDIATELY. If it is mild, please call our office. . If you take any of these medications: Glipizide/Metformin, Avandament, Glucavance,  please do not take 48 hours after completing test unless otherwise instructed.   Once we have confirmed authorization from your insurance company, we will call you to set up a date and time for your test. Based on how quickly your insurance processes prior authorizations requests, please allow up to 4 weeks to be contacted for scheduling your Cardiac CT appointment. Be advised that routine Cardiac CT appointments could be scheduled as many as 8 weeks after your provider has ordered it.  For non-scheduling related questions, please contact the cardiac imaging nurse navigator should you have any questions/concerns: Marchia Bond, Cardiac Imaging Nurse Navigator Burley Saver, Interim Cardiac Imaging Nurse [place] [facility] Direct Office Dial: [phone]   For scheduling needs, including cancellations and rescheduling, please call Vivien Rota at [phone], option 3.

## 2020-05-12 NOTE — Addendum Note (Signed)
Addended by: Stanton Kidney on: 05/12/2020 01:25 PM   Modules accepted: Orders

## 2020-05-18 ENCOUNTER — Telehealth: Payer: Self-pay | Admitting: *Deleted

## 2020-05-18 DIAGNOSIS — I712 Thoracic aortic aneurysm, without rupture, unspecified: Secondary | ICD-10-CM

## 2020-05-18 DIAGNOSIS — Z01812 Encounter for preprocedural laboratory examination: Secondary | ICD-10-CM

## 2020-05-18 NOTE — Telephone Encounter (Signed)
-----   Message from Jason Santana sent at 05/17/2020 10:46 AM EDT ----- Regarding: BMET Patient is scheduled for a CT angio chest on 05-25-20 at [facility].  He will come in on Friday, sept 17th for labs.  Please put in order for BMET.  Thanks

## 2020-05-20 ENCOUNTER — Other Ambulatory Visit: Payer: Self-pay

## 2020-05-20 ENCOUNTER — Other Ambulatory Visit: Payer: Medicare Other | Admitting: *Deleted

## 2020-05-20 DIAGNOSIS — Z01812 Encounter for preprocedural laboratory examination: Secondary | ICD-10-CM | POA: Diagnosis not present

## 2020-05-20 DIAGNOSIS — I712 Thoracic aortic aneurysm, without rupture, unspecified: Secondary | ICD-10-CM

## 2020-05-20 LAB — BASIC METABOLIC PANEL
BUN/Creatinine Ratio: 14 (ref 10–24)
BUN: 17 mg/dL (ref 8–27)
CO2: 24 mmol/L (ref 20–29)
Calcium: 9.7 mg/dL (ref 8.6–10.2)
Chloride: 102 mmol/L (ref 96–106)
Creatinine, Ser: 1.23 mg/dL (ref 0.76–1.27)
GFR calc Af Amer: 61 mL/min/{1.73_m2} (ref >59)
GFR calc non Af Amer: 53 mL/min/{1.73_m2} — ABNORMAL LOW (ref >59)
Glucose: 108 mg/dL — ABNORMAL HIGH (ref 65–99)
Potassium: 4.3 mmol/L (ref 3.5–5.2)
Sodium: 137 mmol/L (ref 134–144)

## 2020-05-25 ENCOUNTER — Other Ambulatory Visit: Payer: Self-pay

## 2020-05-25 ENCOUNTER — Ambulatory Visit (HOSPITAL_COMMUNITY)
Admission: RE | Admit: 2020-05-25 | Discharge: 2020-05-25 | Disposition: A | Discharge status: Home / Self Care | Payer: Medicare Other | Source: Ambulatory Visit | Attending: Cardiology | Admitting: Cardiology

## 2020-05-25 DIAGNOSIS — I712 Thoracic aortic aneurysm, without rupture, unspecified: Secondary | ICD-10-CM

## 2020-05-25 MED ORDER — IOHEXOL 350 MG/ML SOLN
100.0000 mL | Freq: Once | INTRAVENOUS | Status: AC | PRN
  Administered 2020-05-25: 100 mL via INTRAVENOUS

## 2020-06-10 DIAGNOSIS — Z7901 Long term (current) use of anticoagulants: Secondary | ICD-10-CM | POA: Diagnosis not present

## 2020-06-20 ENCOUNTER — Telehealth: Payer: Self-pay | Admitting: Pharmacist

## 2020-06-20 DIAGNOSIS — I10 Essential (primary) hypertension: Secondary | ICD-10-CM

## 2020-06-20 NOTE — Telephone Encounter (Signed)
Patient called stating his blood pressure has been elevated for about a week now. BP in the 160's/70. No changes in diet. He was previously on spironolactone 50mg - decreased to 25mg  then eventually stopped due to lower BP. I will resume spironolactone at 12.5mg  daily. BMP in 10 days. I will follow up with patient about his BP after BMP results.

## 2020-06-21 DIAGNOSIS — H353111 Nonexudative age-related macular degeneration, right eye, early dry stage: Secondary | ICD-10-CM | POA: Diagnosis not present

## 2020-06-21 DIAGNOSIS — H43813 Vitreous degeneration, bilateral: Secondary | ICD-10-CM | POA: Diagnosis not present

## 2020-06-21 DIAGNOSIS — H353221 Exudative age-related macular degeneration, left eye, with active choroidal neovascularization: Secondary | ICD-10-CM | POA: Diagnosis not present

## 2020-06-24 DIAGNOSIS — H353221 Exudative age-related macular degeneration, left eye, with active choroidal neovascularization: Secondary | ICD-10-CM | POA: Diagnosis not present

## 2020-06-29 ENCOUNTER — Other Ambulatory Visit: Payer: Medicare Other | Admitting: *Deleted

## 2020-06-29 ENCOUNTER — Other Ambulatory Visit: Payer: Self-pay

## 2020-06-29 DIAGNOSIS — I10 Essential (primary) hypertension: Secondary | ICD-10-CM | POA: Diagnosis not present

## 2020-06-29 LAB — BASIC METABOLIC PANEL
BUN/Creatinine Ratio: 15 (ref 10–24)
BUN: 18 mg/dL (ref 8–27)
CO2: 21 mmol/L (ref 20–29)
Calcium: 9.8 mg/dL (ref 8.6–10.2)
Chloride: 105 mmol/L (ref 96–106)
Creatinine, Ser: 1.17 mg/dL (ref 0.76–1.27)
GFR calc Af Amer: 65 mL/min/{1.73_m2} (ref >59)
GFR calc non Af Amer: 57 mL/min/{1.73_m2} — ABNORMAL LOW (ref >59)
Glucose: 150 mg/dL — ABNORMAL HIGH (ref 65–99)
Potassium: 4.8 mmol/L (ref 3.5–5.2)
Sodium: 137 mmol/L (ref 134–144)

## 2020-06-30 ENCOUNTER — Telehealth: Payer: Self-pay | Admitting: Pharmacist

## 2020-06-30 DIAGNOSIS — I10 Essential (primary) hypertension: Secondary | ICD-10-CM

## 2020-06-30 NOTE — Telephone Encounter (Signed)
Called pt to review BMP. Scr and K stable. Patient reports his BP today 147/67. States that's about what its been since starting spironolactone 12.5mg  daily. It has come down about 10 points. Will increase spironolactone to 25mg  daily and repeat BMP on 11/8. I will call pt on the 9th with lab results and to follow up on how his BP is doing.

## 2020-07-11 ENCOUNTER — Other Ambulatory Visit: Payer: Medicare Other | Admitting: *Deleted

## 2020-07-11 ENCOUNTER — Other Ambulatory Visit: Payer: Self-pay

## 2020-07-11 DIAGNOSIS — I10 Essential (primary) hypertension: Secondary | ICD-10-CM | POA: Diagnosis not present

## 2020-07-11 DIAGNOSIS — Z7901 Long term (current) use of anticoagulants: Secondary | ICD-10-CM | POA: Diagnosis not present

## 2020-07-11 LAB — BASIC METABOLIC PANEL
BUN/Creatinine Ratio: 17 (ref 10–24)
BUN: 25 mg/dL (ref 8–27)
CO2: 19 mmol/L — ABNORMAL LOW (ref 20–29)
Calcium: 10.3 mg/dL — ABNORMAL HIGH (ref 8.6–10.2)
Chloride: 104 mmol/L (ref 96–106)
Creatinine, Ser: 1.48 mg/dL — ABNORMAL HIGH (ref 0.76–1.27)
GFR calc Af Amer: 49 mL/min/{1.73_m2} — ABNORMAL LOW (ref >59)
GFR calc non Af Amer: 43 mL/min/{1.73_m2} — ABNORMAL LOW (ref >59)
Glucose: 114 mg/dL — ABNORMAL HIGH (ref 65–99)
Potassium: 5.2 mmol/L (ref 3.5–5.2)
Sodium: 135 mmol/L (ref 134–144)

## 2020-07-12 ENCOUNTER — Other Ambulatory Visit: Payer: Self-pay | Admitting: Pharmacist

## 2020-07-12 ENCOUNTER — Telehealth: Payer: Self-pay | Admitting: Pharmacist

## 2020-07-12 DIAGNOSIS — I10 Essential (primary) hypertension: Secondary | ICD-10-CM

## 2020-07-12 NOTE — Telephone Encounter (Signed)
Patient has had bump in scr since increasing spironolactone to 25mg  daily on 10/28. Will need to decrease spironolactone back to 12.5mg  daily. Spoke with patient. He endorses drinking lots of water. I have asked him to increase water intake. BP has been in the 130's for the past 3 days, 140's prior to that. He will decrease spironolactone to 12.5mg  daily and repeat BMP in 1 week. I will give his BP a little more time, if still >140 than will need to discuss other options. He has been very active lately per pt report.

## 2020-07-12 NOTE — Addendum Note (Signed)
Addended by: Marcelle Overlie D on: 07/12/2020 08:26 AM   Modules accepted: Orders

## 2020-07-20 ENCOUNTER — Other Ambulatory Visit: Payer: Medicare Other | Admitting: *Deleted

## 2020-07-20 ENCOUNTER — Other Ambulatory Visit: Payer: Self-pay

## 2020-07-20 DIAGNOSIS — I10 Essential (primary) hypertension: Secondary | ICD-10-CM | POA: Diagnosis not present

## 2020-07-20 LAB — BASIC METABOLIC PANEL
BUN/Creatinine Ratio: 19 (ref 10–24)
BUN: 25 mg/dL (ref 8–27)
CO2: 22 mmol/L (ref 20–29)
Calcium: 9.7 mg/dL (ref 8.6–10.2)
Chloride: 106 mmol/L (ref 96–106)
Creatinine, Ser: 1.3 mg/dL — ABNORMAL HIGH (ref 0.76–1.27)
GFR calc Af Amer: 58 mL/min/{1.73_m2} — ABNORMAL LOW (ref >59)
GFR calc non Af Amer: 50 mL/min/{1.73_m2} — ABNORMAL LOW (ref >59)
Glucose: 170 mg/dL — ABNORMAL HIGH (ref 65–99)
Potassium: 4.7 mmol/L (ref 3.5–5.2)
Sodium: 137 mmol/L (ref 134–144)

## 2020-07-22 ENCOUNTER — Telehealth: Payer: Self-pay | Admitting: Pharmacist

## 2020-07-22 DIAGNOSIS — H3562 Retinal hemorrhage, left eye: Secondary | ICD-10-CM | POA: Diagnosis not present

## 2020-07-22 DIAGNOSIS — H353111 Nonexudative age-related macular degeneration, right eye, early dry stage: Secondary | ICD-10-CM | POA: Diagnosis not present

## 2020-07-22 DIAGNOSIS — H353221 Exudative age-related macular degeneration, left eye, with active choroidal neovascularization: Secondary | ICD-10-CM | POA: Diagnosis not present

## 2020-07-22 DIAGNOSIS — H43813 Vitreous degeneration, bilateral: Secondary | ICD-10-CM | POA: Diagnosis not present

## 2020-07-22 NOTE — Telephone Encounter (Signed)
Scr and K improved with decreased spironolactone dose. Do not want to increase diltiazem as patient felt very tired on higher dose. Already on max dose valsartan. Had hyponatremia with HCTZ.  Called pt to discuss BP. BP has been about 140/60 per pt report. He has had some in the 130's and BP was 811 systolic the other day after working hard in the garage. At this point we would need to add another medication to lower BP further. Patient has been very active. Will not make any changes at this point. Pt to call if BP is >914 systolic consistently.

## 2020-08-08 DIAGNOSIS — Z7901 Long term (current) use of anticoagulants: Secondary | ICD-10-CM | POA: Diagnosis not present

## 2020-08-09 DIAGNOSIS — Z23 Encounter for immunization: Secondary | ICD-10-CM | POA: Diagnosis not present

## 2020-08-19 DIAGNOSIS — H353111 Nonexudative age-related macular degeneration, right eye, early dry stage: Secondary | ICD-10-CM | POA: Diagnosis not present

## 2020-08-19 DIAGNOSIS — H43813 Vitreous degeneration, bilateral: Secondary | ICD-10-CM | POA: Diagnosis not present

## 2020-08-19 DIAGNOSIS — H353222 Exudative age-related macular degeneration, left eye, with inactive choroidal neovascularization: Secondary | ICD-10-CM | POA: Diagnosis not present

## 2020-09-05 DIAGNOSIS — Z7901 Long term (current) use of anticoagulants: Secondary | ICD-10-CM | POA: Diagnosis not present

## 2020-09-13 ENCOUNTER — Telehealth: Payer: Self-pay | Admitting: Pharmacist

## 2020-09-13 MED ORDER — DILTIAZEM HCL ER 120 MG PO CP24: 120.0000 mg | ORAL_CAPSULE | Freq: Every day | ORAL | 3 refills | Status: DC

## 2020-09-13 MED ORDER — DILTIAZEM HCL ER COATED BEADS 180 MG PO TB24: 180.0000 mg | ORAL_TABLET | Freq: Every day | ORAL | 3 refills | Status: DC

## 2020-09-13 NOTE — Telephone Encounter (Signed)
Patient called stating his diltazem is now $45.  I called his pharmacy and asked them to run a claim with a different diltiazem formulation. Per pharmacy, plain diltiazem ER tabs are cheap. I sent in new Rx Pt made aware to call if there are any other issues.  [facility]  ID [id number] [phone]

## 2020-09-17 ENCOUNTER — Other Ambulatory Visit: Payer: Self-pay | Admitting: Cardiology

## 2020-09-27 ENCOUNTER — Telehealth: Payer: Self-pay | Admitting: Pharmacist

## 2020-09-27 DIAGNOSIS — I10 Essential (primary) hypertension: Secondary | ICD-10-CM

## 2020-09-27 MED ORDER — SPIRONOLACTONE 25 MG PO TABS
25.0000 mg | ORAL_TABLET | Freq: Every day | ORAL | 3 refills | Status: DC
Start: 2020-09-27 — End: 2021-09-28

## 2020-09-27 NOTE — Telephone Encounter (Signed)
Patient called stating his BP has been up for about a week now. 149/61 this AM 175/68 before meds I have asked him to increase his spironolactone to 25mg  daily. I have asked him to decrease his K rich foods. Hyponatremia to thiazides, fatigue to diltiazem 180mg , wheezing with beta blockers. On max dose of ARB BMP in 1 week. Getting exercise working in his garage walking up and down many steps.

## 2020-10-04 ENCOUNTER — Other Ambulatory Visit: Payer: Self-pay

## 2020-10-04 ENCOUNTER — Other Ambulatory Visit: Payer: Medicare Other | Admitting: *Deleted

## 2020-10-04 DIAGNOSIS — I10 Essential (primary) hypertension: Secondary | ICD-10-CM | POA: Diagnosis not present

## 2020-10-04 DIAGNOSIS — Z7901 Long term (current) use of anticoagulants: Secondary | ICD-10-CM | POA: Diagnosis not present

## 2020-10-04 LAB — BASIC METABOLIC PANEL
BUN/Creatinine Ratio: 19 (ref 10–24)
BUN: 24 mg/dL (ref 8–27)
CO2: 23 mmol/L (ref 20–29)
Calcium: 9.8 mg/dL (ref 8.6–10.2)
Chloride: 103 mmol/L (ref 96–106)
Creatinine, Ser: 1.29 mg/dL — ABNORMAL HIGH (ref 0.76–1.27)
GFR calc Af Amer: 58 mL/min/{1.73_m2} — ABNORMAL LOW (ref >59)
GFR calc non Af Amer: 50 mL/min/{1.73_m2} — ABNORMAL LOW (ref >59)
Glucose: 140 mg/dL — ABNORMAL HIGH (ref 65–99)
Potassium: 4.9 mmol/L (ref 3.5–5.2)
Sodium: 137 mmol/L (ref 134–144)

## 2020-10-06 ENCOUNTER — Telehealth: Payer: Self-pay | Admitting: Pharmacist

## 2020-10-06 NOTE — Telephone Encounter (Signed)
Called pt to review labs and blood pressure States his SBP has been 141, 145, 141, 153, 154 Feels fine. Labs stable. Has been on higher dose of spironolactone for about 1 week. Will continue same dose 25mg  of spironolatone plus his diovan 320mg  and diltiazem 120mg  and follow up in 1 week.

## 2020-10-13 ENCOUNTER — Telehealth: Payer: Self-pay | Admitting: Pharmacist

## 2020-10-13 NOTE — Telephone Encounter (Signed)
Called pt to follow up on his blood pressure. States its going much better.  131/62 561/53 794 systolic 327 systolic [place] spironolactone 25mg  daily, valsartan 320mg  daily and diltiazem 120mg  daily. Follow up as needed.

## 2020-10-18 DIAGNOSIS — H353112 Nonexudative age-related macular degeneration, right eye, intermediate dry stage: Secondary | ICD-10-CM | POA: Diagnosis not present

## 2020-10-18 DIAGNOSIS — H43813 Vitreous degeneration, bilateral: Secondary | ICD-10-CM | POA: Diagnosis not present

## 2020-10-18 DIAGNOSIS — H34831 Tributary (branch) retinal vein occlusion, right eye, with macular edema: Secondary | ICD-10-CM | POA: Diagnosis not present

## 2020-10-18 DIAGNOSIS — H353221 Exudative age-related macular degeneration, left eye, with active choroidal neovascularization: Secondary | ICD-10-CM | POA: Diagnosis not present

## 2020-10-26 DIAGNOSIS — R300 Dysuria: Secondary | ICD-10-CM | POA: Diagnosis not present

## 2020-11-07 DIAGNOSIS — Z7901 Long term (current) use of anticoagulants: Secondary | ICD-10-CM | POA: Diagnosis not present

## 2020-11-07 DIAGNOSIS — N390 Urinary tract infection, site not specified: Secondary | ICD-10-CM | POA: Diagnosis not present

## 2020-11-09 DIAGNOSIS — E039 Hypothyroidism, unspecified: Secondary | ICD-10-CM | POA: Diagnosis not present

## 2020-11-09 DIAGNOSIS — N644 Mastodynia: Secondary | ICD-10-CM | POA: Diagnosis not present

## 2020-11-09 DIAGNOSIS — I4891 Unspecified atrial fibrillation: Secondary | ICD-10-CM | POA: Diagnosis not present

## 2020-11-09 DIAGNOSIS — Z952 Presence of prosthetic heart valve: Secondary | ICD-10-CM | POA: Diagnosis not present

## 2020-11-09 DIAGNOSIS — D509 Iron deficiency anemia, unspecified: Secondary | ICD-10-CM | POA: Diagnosis not present

## 2020-11-09 DIAGNOSIS — I10 Essential (primary) hypertension: Secondary | ICD-10-CM | POA: Diagnosis not present

## 2020-11-09 DIAGNOSIS — D6869 Other thrombophilia: Secondary | ICD-10-CM | POA: Diagnosis not present

## 2020-11-09 DIAGNOSIS — R030 Elevated blood-pressure reading, without diagnosis of hypertension: Secondary | ICD-10-CM | POA: Diagnosis not present

## 2020-11-09 DIAGNOSIS — D539 Nutritional anemia, unspecified: Secondary | ICD-10-CM | POA: Diagnosis not present

## 2020-11-11 ENCOUNTER — Ambulatory Visit (INDEPENDENT_AMBULATORY_CARE_PROVIDER_SITE_OTHER): Payer: Medicare Other | Admitting: Cardiology

## 2020-11-11 ENCOUNTER — Other Ambulatory Visit: Payer: Self-pay

## 2020-11-11 ENCOUNTER — Telehealth: Payer: Self-pay | Admitting: Pharmacist

## 2020-11-11 ENCOUNTER — Encounter: Payer: Self-pay | Admitting: Cardiology

## 2020-11-11 VITALS — BP 156/56 | HR 72 | Ht 70.0 in | Wt 183.0 lb

## 2020-11-11 DIAGNOSIS — I4819 Other persistent atrial fibrillation: Secondary | ICD-10-CM | POA: Diagnosis not present

## 2020-11-11 NOTE — Patient Instructions (Addendum)
Medication Instructions:  Your physician recommends that you continue on your current medications as directed. Please refer to the Current Medication list given to you today.  *If you need a refill on your cardiac medications before your next appointment, please call your pharmacy*   Lab Work: None ordered   Testing/Procedures: None ordered   Follow-Up: At [facility], you and your health needs are our priority.  As part of our continuing mission to provide you with exceptional heart care, we have created designated Provider Care Teams.  These Care Teams include your primary Cardiologist (physician) and Advanced Practice Providers (APPs -  Physician Assistants and Nurse Practitioners) who all work together to provide you with the care you need, when you need it.  We recommend signing up for the patient portal called "MyChart".  Sign up information is provided on this After Visit Summary.  MyChart is used to connect with patients for Virtual Visits (Telemedicine).  Patients are able to view lab/test results, encounter notes, upcoming appointments, etc.  Non-urgent messages can be sent to your provider as well.   To learn more about what you can do with MyChart, go to [email or web address].    Your next appointment:   6 month(s)  The format for your next appointment:   In Person  Provider:   You may see Will Meredith Leeds, MD or one of the following Advanced Practice Providers on your designated Care Team:    Chanetta Marshall, NP  Tommye Standard, [place]  Legrand Como "Oda Kilts, [place]   Your physician recommends that you schedule an overdue follow-up appointment in: 1-2 months with Dr. Tamala Julian    Thank you for choosing [facility]!!   Trinidad Curet, RN [phone]

## 2020-11-11 NOTE — Telephone Encounter (Signed)
Patient called stating he doesn't think his blood pressure medications are working. States his BP when he first gets up is lower than after he takes his meds. This AM his BP was 149 systolic when he work up and then 142 after his meds and then 145 a little bit later. I advised that he is probably checking his blood pressure too much and causing himself anxiety,making it go up. BP goal for Jason Santana is <140/90. He has multiple intolerances and there really is not anymore changes we can make. BP is relatively well controlled.  Advised that he could bring his cuff into clinic today when he see's Dr.Camnitz for calibration if he would like

## 2020-11-11 NOTE — Progress Notes (Signed)
Electrophysiology Office Note   Date:  11/11/2020   ID:  Jason Santana, DOB 1932-08-10, MRN [id number]  PCP:  Jason Huddle, MD  Cardiologist:  Tamala Julian Primary Electrophysiologist:  Will Meredith Leeds, MD    No chief complaint on file.    History of Present Illness: Jason Santana is a 87 y.o. male who is being seen today for the evaluation of atrial fibrillation at the request of Jason Huddle, MD. Presenting today for electrophysiology evaluation.    He has a history of mechanical aortic valve replacement in 2000, atrial fibrillation, atrial flutter, coronary artery disease.  He had a GI bleed May 2019 because of falls and his anticoagulation.  His atrial fibrillation symptoms are lightheadedness and fatigue.  He was started on amiodarone December 2018.  He presented emergency room 04/10/2019 with palpitations and was found to be in atrial fibrillation.  He is now status post AF ablation 05/22/2019.  Today, denies symptoms of palpitations, chest pain, shortness of breath, orthopnea, PND, lower extremity edema, claudication, dizziness, presyncope, syncope, bleeding, or neurologic sequela. The patient is tolerating medications without difficulties.  Since last being seen he has done well.  He has no chest pain or shortness of breath.  He is able do all of his daily activities without restriction.   Past Medical History:  Diagnosis Date  . Anticoagulation adequate 05/23/2019  . Atrial fibrillation ([place])   . CAD (coronary artery disease) of artery bypass graft 2000   2000 with multiple bypasses   . Chronic anticoagulation    Coumadin therapy for mechanical aortic valve. Postoperative atrial fibrillation.   . Essential hypertension   . H/O mechanical aortic valve replacement 2000   St Jude AVR  . Hypothyroidism   . Premature ventricular contractions   . S/P ablation of atrial fibrillation 05/22/19 05/23/2019  . Typical atrial flutter ([place]) 05/23/2019   Past Surgical History:  Procedure  Laterality Date  . AORTIC VALVE REPLACEMENT (AVR)/CORONARY ARTERY BYPASS GRAFTING (CABG)  2000  . ATRIAL FIBRILLATION ABLATION N/A 05/22/2019   Procedure: ATRIAL FIBRILLATION ABLATION;  Surgeon: Constance Haw, MD;  Location: [place] [facility];  Service: Cardiovascular;  Laterality: N/A;  . CARDIOVERSION N/A 02/24/2018   Procedure: CARDIOVERSION;  Surgeon: Fay Records, MD;  Location: [facility];  Service: Cardiovascular;  Laterality: N/A;  . CARDIOVERSION N/A 08/15/2018   Procedure: CARDIOVERSION;  Surgeon: Thayer Headings, MD;  Location: [place];  Service: Cardiovascular;  Laterality: N/A;  . COLONOSCOPY WITH PROPOFOL N/A 01/08/2018   Procedure: COLONOSCOPY WITH PROPOFOL;  Surgeon: Arta Silence, MD;  Location: [facility] ENDOSCOPY;  Service: Gastroenterology;  Laterality: N/A;  . CORONARY/GRAFT ANGIOGRAPHY N/A 08/14/2018   Procedure: CORONARY/GRAFT ANGIOGRAPHY;  Surgeon: [name], MD;  Location: [place] [facility];  Service: Cardiovascular;  Laterality: N/A;  . ESOPHAGOGASTRODUODENOSCOPY (EGD) WITH PROPOFOL N/A 01/06/2018   Procedure: ESOPHAGOGASTRODUODENOSCOPY (EGD) WITH PROPOFOL;  Surgeon: Otis Brace, MD;  Location: [facility]OS[facility];  Service: Gastroenterology;  Laterality: N/A;     Current Outpatient Medications  Medication Sig Dispense Refill  . Cholecalciferol (VITAMIN D) 2000 units tablet Take 2,000 Units by mouth daily.    [name] diltiazem (CARDIZEM LA) 120 MG 24 hr tablet Take 1 tablet (120 mg total) by mouth daily. 90 tablet 3  . diltiazem (CARDIZEM) 30 MG tablet TAKE 1 TABLET AS NEEDED FOR PALPITATIONS OR HEART RATE >100 30 tablet 6  . diphenhydramine-acetaminophen (TYLENOL PM) 25-500 MG TABS tablet Take 0.5 tablets by mouth at bedtime as needed (sleep).    [name]  ferrous sulfate 325 (65 FE) MG tablet Take 325 mg by mouth daily with supper.    . furosemide (LASIX) 20 MG tablet Take 20 mg by mouth daily.    [name] levothyroxine (SYNTHROID) 125 MCG tablet Take 125 mcg by mouth  daily before breakfast.     . Multiple Vitamins-Minerals (PRESERVISION AREDS 2 PO) Take 1 capsule by mouth 2 (two) times daily.    . pantoprazole (PROTONIX) 40 MG tablet Take 40 mg by mouth 4 (four) times a week.    [name] Glycol-Propyl Glycol (SYSTANE OP) Place 1 drop into both eyes daily as needed (dry eyes).    [name] spironolactone (ALDACTONE) 25 MG tablet Take 1 tablet (25 mg total) by mouth daily. 90 tablet 3  . tobramycin (TOBREX) 0.3 % ophthalmic solution Place 1 drop into the left eye See admin instructions. Instill 1 drop into left eye 1 day before, the day of, and the day after eye injections administered at Dr's office    . valsartan (DIOVAN) 320 MG tablet TAKE 1 TABLET BY MOUTH EVERY DAY 90 tablet 2  . warfarin (COUMADIN) 7.5 MG tablet Take 5-7.5 mg by mouth See admin instructions. Per patient taking 5mg  on Sundays     No current facility-administered medications for this visit.    Allergies:   Carvedilol, Lopid [gemfibrozil], Monopril [fosinopril], Vicodin [hydrocodone-acetaminophen], Phenergan [promethazine hcl], Zocor [simvastatin], Benicar [olmesartan], Codeine, and Prilosec [omeprazole]   Social History:  The patient  reports that he has quit smoking. His smoking use included cigarettes. He has never used smokeless tobacco. He reports that he does not drink alcohol and does not use drugs.   Family History:  The patient's family history includes Heart attack in his mother; Hypertension in his father and mother.   ROS:  Please see the history of present illness.   Otherwise, review of systems is positive for none.   All other systems are reviewed and negative.   PHYSICAL EXAM: VS:  BP (!) 156/56   Pulse 72   Ht 5\' 10"  (1.778 m)   Wt 183 lb (83 kg)   SpO2 98%   BMI 26.26 kg/m  , BMI Body mass index is 26.26 kg/m. GEN: Well nourished, well developed, in no acute distress  HEENT: normal  Neck: no JVD, carotid bruits, or masses Cardiac: RRR; no murmurs, rubs, or gallops,no  edema  Respiratory:  clear to auscultation bilaterally, normal work of breathing GI: soft, nontender, nondistended, + BS MS: no deformity or atrophy  Skin: warm and dry Neuro:  Strength and sensation are intact Psych: euthymic mood, full affect  EKG:  EKG is ordered today. Personal review of the ekg ordered shows sinus rhythm, rate 72  Recent Labs: 02/27/2020: ALT 23 02/28/2020: Magnesium 1.8; TSH 0.253 03/01/2020: Hemoglobin 9.5; Platelets 127 10/04/2020: BUN 24; Creatinine, Ser 1.29; Potassium 4.9; Sodium 137    Lipid Panel     Component Value Date/Time   CHOL 130 04/15/2019 0905   TRIG 157 (H) 04/15/2019 0905   HDL 30 (L) 04/15/2019 0905   CHOLHDL 4.3 04/15/2019 0905   CHOLHDL 7.7 08/12/2018 0225   VLDL 48 (H) 08/12/2018 0225   LDLCALC 69 04/15/2019 0905     Wt Readings from Last 3 Encounters:  11/11/20 183 lb (83 kg)  05/12/20 176 lb 9.6 oz (80.1 kg)  03/01/20 172 lb (78 kg)      Other studies Reviewed: Additional studies/ records that were reviewed today include: TTE 08/12/18  Review of the above records  today demonstrates:  - Left ventricle: The cavity size was normal. There was mild   concentric hypertrophy. Systolic function was normal. The   estimated ejection fraction was in the range of 55% to 60%. Wall   motion was normal; there were no regional wall motion   abnormalities. - Ventricular septum: Septal motion showed paradox. These changes   are consistent with a post-thoracotomy state. - Aortic valve: A mechanical prosthesis was present and functioning   normally. - Mitral valve: Calcified annulus. There was mild regurgitation. - Left atrium: The atrium was mildly dilated. - Right ventricle: Systolic function was mildly to moderately   reduced. - Right atrium: The atrium was mildly dilated.  LHC 08/14/18  Mid RCA lesion is 100% stenosed.  Ost 2nd Mrg lesion is 100% stenosed.  Prox LAD to Mid LAD lesion is 50% stenosed.  SVG.  The graft exhibits  mild .  1.  Severe two-vessel coronary artery disease with total occlusion of the OM 2 and the mid RCA, nonobstructive LAD stenosis 2.  Status post remote aortocoronary bypass surgery with continued patency of the LIMA to LAD, saphenous vein graft OM 2, and saphenous vein graft to PDA  ASSESSMENT AND PLAN:  1.  Persistent atrial fibrillation: Currently on warfarin.  Status post ablation 05/22/2019.  CHA2DS2-VASc of 4.  He has had no further episodes.  No changes.  2.  Mechanical aortic valve: Stable on most recent echo.  Plan per primary cardiology.  3.  Coronary artery disease: No current chest pain  4.  Hypertension:Elevated today.  He says it is usually well controlled.  No changes.  5.  Aortic aneurysm: Found on CT scan.  Will need yearly echoes.  Current medicines are reviewed at length with the patient today.   The patient does not have concerns regarding his medicines.  The following changes were made today: None  Labs/ tests ordered today include:  Orders Placed This Encounter  Procedures  . EKG 12-Lead    Disposition:   FU with Will Camnitz 6 months  Signed, Will Meredith Leeds, MD  11/11/2020 11:02 AM     [facility] [address] [place] [address] [phone] (office) [phone] (fax)

## 2020-11-15 DIAGNOSIS — H353112 Nonexudative age-related macular degeneration, right eye, intermediate dry stage: Secondary | ICD-10-CM | POA: Diagnosis not present

## 2020-11-15 DIAGNOSIS — H34831 Tributary (branch) retinal vein occlusion, right eye, with macular edema: Secondary | ICD-10-CM | POA: Diagnosis not present

## 2020-11-15 DIAGNOSIS — H353221 Exudative age-related macular degeneration, left eye, with active choroidal neovascularization: Secondary | ICD-10-CM | POA: Diagnosis not present

## 2020-12-05 DIAGNOSIS — N390 Urinary tract infection, site not specified: Secondary | ICD-10-CM | POA: Diagnosis not present

## 2020-12-05 DIAGNOSIS — R7989 Other specified abnormal findings of blood chemistry: Secondary | ICD-10-CM | POA: Diagnosis not present

## 2020-12-05 DIAGNOSIS — Z7901 Long term (current) use of anticoagulants: Secondary | ICD-10-CM | POA: Diagnosis not present

## 2020-12-05 DIAGNOSIS — M7021 Olecranon bursitis, right elbow: Secondary | ICD-10-CM | POA: Diagnosis not present

## 2020-12-05 DIAGNOSIS — I10 Essential (primary) hypertension: Secondary | ICD-10-CM | POA: Diagnosis not present

## 2020-12-12 DIAGNOSIS — N390 Urinary tract infection, site not specified: Secondary | ICD-10-CM | POA: Diagnosis not present

## 2020-12-12 DIAGNOSIS — R7989 Other specified abnormal findings of blood chemistry: Secondary | ICD-10-CM | POA: Diagnosis not present

## 2020-12-13 DIAGNOSIS — H353112 Nonexudative age-related macular degeneration, right eye, intermediate dry stage: Secondary | ICD-10-CM | POA: Diagnosis not present

## 2020-12-13 DIAGNOSIS — H34831 Tributary (branch) retinal vein occlusion, right eye, with macular edema: Secondary | ICD-10-CM | POA: Diagnosis not present

## 2020-12-13 DIAGNOSIS — H353221 Exudative age-related macular degeneration, left eye, with active choroidal neovascularization: Secondary | ICD-10-CM | POA: Diagnosis not present

## 2020-12-13 DIAGNOSIS — H43813 Vitreous degeneration, bilateral: Secondary | ICD-10-CM | POA: Diagnosis not present

## 2020-12-16 ENCOUNTER — Other Ambulatory Visit: Payer: Self-pay

## 2020-12-16 ENCOUNTER — Ambulatory Visit (INDEPENDENT_AMBULATORY_CARE_PROVIDER_SITE_OTHER): Payer: Medicare Other | Admitting: Interventional Cardiology

## 2020-12-16 ENCOUNTER — Telehealth: Payer: Self-pay | Admitting: Pharmacist

## 2020-12-16 ENCOUNTER — Encounter: Payer: Self-pay | Admitting: Interventional Cardiology

## 2020-12-16 VITALS — BP 142/68 | HR 74 | Ht 70.0 in | Wt 181.2 lb

## 2020-12-16 DIAGNOSIS — I25708 Atherosclerosis of coronary artery bypass graft(s), unspecified, with other forms of angina pectoris: Secondary | ICD-10-CM | POA: Diagnosis not present

## 2020-12-16 DIAGNOSIS — I712 Thoracic aortic aneurysm, without rupture, unspecified: Secondary | ICD-10-CM

## 2020-12-16 DIAGNOSIS — D6869 Other thrombophilia: Secondary | ICD-10-CM | POA: Diagnosis not present

## 2020-12-16 DIAGNOSIS — I10 Essential (primary) hypertension: Secondary | ICD-10-CM

## 2020-12-16 DIAGNOSIS — Z952 Presence of prosthetic heart valve: Secondary | ICD-10-CM | POA: Diagnosis not present

## 2020-12-16 DIAGNOSIS — I4819 Other persistent atrial fibrillation: Secondary | ICD-10-CM | POA: Diagnosis not present

## 2020-12-16 NOTE — Patient Instructions (Signed)
Medication Instructions:  Your physician recommends that you continue on your current medications as directed. Please refer to the Current Medication list given to you today.  *If you need a refill on your cardiac medications before your next appointment, please call your pharmacy*   Lab Work: None If you have labs (blood work) drawn today and your tests are completely normal, you will receive your results only by: [name] MyChart Message (if you have MyChart) OR . A paper copy in the mail If you have any lab test that is abnormal or we need to change your treatment, we will call you to review the results.   Testing/Procedures: None   Follow-Up: At [facility], you and your health needs are our priority.  As part of our continuing mission to provide you with exceptional heart care, we have created designated Provider Care Teams.  These Care Teams include your primary Cardiologist (physician) and Advanced Practice Providers (APPs -  Physician Assistants and Nurse Practitioners) who all work together to provide you with the care you need, when you need it.  We recommend signing up for the patient portal called "MyChart".  Sign up information is provided on this After Visit Summary.  MyChart is used to connect with patients for Virtual Visits (Telemedicine).  Patients are able to view lab/test results, encounter notes, upcoming appointments, etc.  Non-urgent messages can be sent to your provider as well.   To learn more about what you can do with MyChart, go to [email or web address].    Your next appointment:   6-8 month(s)  The format for your next appointment:   In Person  Provider:   You may see Sinclair Grooms, MD or one of the following Advanced Practice Providers on your designated Care Team:    Kathyrn Drown, NP    Other Instructions

## 2020-12-16 NOTE — Telephone Encounter (Signed)
Received call from pt that pharmacy dispensed Tiadylt ER 120mg  daily and he's inquiring if it's the same as the diltiazem we prescribed. This is still a diltiazem extended release formulation to take once a day; advised pt that pharmacy will sometimes interchange 1 brand for another based on availability and that it's a comparable product that's safe for him to take. He was appreciative for assistance.

## 2020-12-16 NOTE — Progress Notes (Signed)
Cardiology Office Note:    Date:  12/16/2020   ID:  Jason Santana, DOB [birth year], MRN [id number]  PCP:  [name], MD  Cardiologist:  Sinclair Grooms, MD   Referring MD: [name], MD   Chief Complaint  Patient presents with  . Atrial Fibrillation  . Coronary Artery Disease  . Cardiac Valve Problem    History of Present Illness:    Jason Santana is a 87 y.o. male with a hx of  mechanical aortic valve replacement (2000),paroxysmalatrial fibrillationand atrial flutter and ablation 2020, chronic anticoagulation therapy, coronary artery diseasewith remote CABG, lower GI bleed May 2019 required pausing of anticoagulation therapy.Electrical cardioversion performed June 2019, August 15, 2018, and October 22, 2018 prior to A. fib/flutter ablation September 2020 [facility]).  Is here today for follow-up.  No cardiac complaints.  Atrial fibrillation has been significantly improved by ablation.  He is concerned that [place] told him his aorta has a problem.  He was told that he needed to see me about this.  He denies orthopnea, PND, edema, palpitations, syncope, and bleeding.  Past Medical History:  Diagnosis Date  . Anticoagulation adequate 05/23/2019  . Atrial fibrillation ([place])   . CAD (coronary artery disease) of artery bypass graft 2000   2000 with multiple bypasses   . Chronic anticoagulation    Coumadin therapy for mechanical aortic valve. Postoperative atrial fibrillation.   . Essential hypertension   . H/O mechanical aortic valve replacement 2000   St Jude AVR  . Hypothyroidism   . Premature ventricular contractions   . S/P ablation of atrial fibrillation 05/22/19 05/23/2019  . Typical atrial flutter ([place]) 05/23/2019    Past Surgical History:  Procedure Laterality Date  . AORTIC VALVE REPLACEMENT (AVR)/CORONARY ARTERY BYPASS GRAFTING (CABG)  2000  . ATRIAL FIBRILLATION ABLATION N/A 05/22/2019   Procedure: ATRIAL FIBRILLATION ABLATION;  Surgeon: Constance Haw, MD;  Location: [place] [facility];  Service: Cardiovascular;  Laterality: N/A;  . CARDIOVERSION N/A 02/24/2018   Procedure: CARDIOVERSION;  Surgeon: Fay Records, MD;  Location: [facility] ENDOSCOPY;  Service: Cardiovascular;  Laterality: N/A;  . CARDIOVERSION N/A 08/15/2018   Procedure: CARDIOVERSION;  Surgeon: Thayer Headings, MD;  Location: [place];  Service: Cardiovascular;  Laterality: N/A;  . COLONOSCOPY WITH PROPOFOL N/A 01/08/2018   Procedure: COLONOSCOPY WITH PROPOFOL;  Surgeon: Arta Silence, MD;  Location: [facility] ENDOSCOPY;  Service: Gastroenterology;  Laterality: N/A;  . CORONARY/GRAFT ANGIOGRAPHY N/A 08/14/2018   Procedure: CORONARY/GRAFT ANGIOGRAPHY;  Surgeon: [name], MD;  Location: [place] [facility];  Service: Cardiovascular;  Laterality: N/A;  . ESOPHAGOGASTRODUODENOSCOPY (EGD) WITH PROPOFOL N/A 01/06/2018   Procedure: ESOPHAGOGASTRODUODENOSCOPY (EGD) WITH PROPOFOL;  Surgeon: Otis Brace, MD;  Location: [facility] ENDOSCOPY;  Service: Gastroenterology;  Laterality: N/A;    Current Medications: Current Meds  Medication Sig  . Cholecalciferol (VITAMIN D) 2000 units tablet Take 2,000 Units by mouth daily.  [name] diltiazem (CARDIZEM LA) 120 MG 24 hr tablet Take 1 tablet (120 mg total) by mouth daily.  [name] diltiazem (CARDIZEM) 30 MG tablet TAKE 1 TABLET AS NEEDED FOR PALPITATIONS OR HEART RATE >100  . diphenhydramine-acetaminophen (TYLENOL PM) 25-500 MG TABS tablet Take 0.5 tablets by mouth at bedtime as needed (sleep).  . ferrous sulfate 325 (65 FE) MG tablet Take 325 mg by mouth daily with supper.  . furosemide (LASIX) 20 MG tablet Take 20 mg by mouth as needed.  [name] levothyroxine (SYNTHROID) 125 MCG tablet Take 125 mcg by mouth daily  before breakfast.   . Multiple Vitamins-Minerals (PRESERVISION AREDS 2 PO) Take 1 capsule by mouth 2 (two) times daily.  . pantoprazole (PROTONIX) 40 MG tablet Take 40 mg by mouth 4 (four) times a week.  [name] Glycol-Propyl Glycol  (SYSTANE OP) Place 1 drop into both eyes daily as needed (dry eyes).  [name] spironolactone (ALDACTONE) 25 MG tablet Take 1 tablet (25 mg total) by mouth daily.  [name] tobramycin (TOBREX) 0.3 % ophthalmic solution Place 1 drop into the left eye See admin instructions. Instill 1 drop into left eye 1 day before, the day of, and the day after eye injections administered at Dr's office  . valsartan (DIOVAN) 320 MG tablet TAKE 1 TABLET BY MOUTH EVERY DAY  . warfarin (COUMADIN) 7.5 MG tablet Take 5-7.5 mg by mouth See admin instructions. Per patient taking 5mg  on Sundays     Allergies:   Carvedilol, Lopid [gemfibrozil], Monopril [fosinopril], Vicodin [hydrocodone-acetaminophen], Phenergan [promethazine hcl], Zocor [simvastatin], Benicar [olmesartan], Codeine, and Prilosec [omeprazole]   Social History   Socioeconomic History  . Marital status: Married    Spouse name: Not on file  . Number of children: Not on file  . Years of education: Not on file  . Highest education level: Not on file  Occupational History  . Not on file  Tobacco Use  . Smoking status: Former Smoker    Types: Cigarettes  . Smokeless tobacco: Never Used  . Tobacco comment: quit 57 years ago  Vaping Use  . Vaping Use: Never used  Substance and Sexual Activity  . Alcohol use: No  . Drug use: No  . Sexual activity: Not on file  Other Topics Concern  . Not on file  Social History Narrative  . Not on file   Social Determinants of Health   Financial Resource Strain: Not on file  Food Insecurity: Not on file  Transportation Needs: Not on file  Physical Activity: Not on file  Stress: Not on file  Social Connections: Not on file     Family History: The patient's family history includes Heart attack in his mother; Hypertension in his father and mother.  ROS:   Please see the history of present illness.    Feels well, has been active, no blood in the urine or stool.  No medication side effects.  All other systems reviewed  and are negative.  EKGs/Labs/Other Studies Reviewed:    The following studies were reviewed today:  CT angiogram of the chest 05/25/2020: IMPRESSION: Stable mild aneurysmal dilatation of the ascending thoracic aorta. Maximal diameter 4 cm.  Recommend annual imaging followup by CTA or MRA. This recommendation follows 2010 ACCF/AHA/AATS/ACR/ASA/SCA/SCAI/SIR/STS/SVM Guidelines for the Diagnosis and Management of Patients with Thoracic Aortic Disease. Circulation. 2010; 121: C144-Y185. Aortic aneurysm NOS (ICD10-I71.9)  No other acute intrathoracic finding  Aortic Atherosclerosis (ICD10-I70.0).  2D Doppler echocardiogram 2019: Study Conclusions   - Left ventricle: The cavity size was normal. There was mild  concentric hypertrophy. Systolic function was normal. The  estimated ejection fraction was in the range of 55% to 60%. Wall  motion was normal; there were no regional wall motion  abnormalities.  - Ventricular septum: Septal motion showed paradox. These changes  are consistent with a post-thoracotomy state.  - Aortic valve: A mechanical prosthesis was present and functioning  normally.  - Mitral valve: Calcified annulus. There was mild regurgitation.  - Left atrium: The atrium was mildly dilated.  - Right ventricle: Systolic function was mildly to moderately  reduced.  -  Right atrium: The atrium was mildly dilated.   EKG:  EKG last EKG done in March 2022 demonstrated this rhythm with left ventricular conduction delay  Recent Labs: 02/27/2020: ALT 23 02/28/2020: Magnesium 1.8; TSH 0.253 03/01/2020: Hemoglobin 9.5; Platelets 127 10/04/2020: BUN 24; Creatinine, Ser 1.29; Potassium 4.9; Sodium 137  Recent Lipid Panel    Component Value Date/Time   CHOL 130 04/15/2019 0905   TRIG 157 (H) 04/15/2019 0905   HDL 30 (L) 04/15/2019 0905   CHOLHDL 4.3 04/15/2019 0905   CHOLHDL 7.7 08/12/2018 0225   VLDL 48 (H) 08/12/2018 0225   LDLCALC 69 04/15/2019 0905     Physical Exam:    VS:  BP (!) 142/68   Pulse 74   Ht 5\' 10"  (1.778 m)   Wt 181 lb 3.2 oz (82.2 kg)   SpO2 99%   BMI 26.00 kg/m     Wt Readings from Last 3 Encounters:  12/16/20 181 lb 3.2 oz (82.2 kg)  11/11/20 183 lb (83 kg)  05/12/20 176 lb 9.6 oz (80.1 kg)     GEN: Appears younger than stated age. No acute distress HEENT: Normal NECK: No JVD. LYMPHATICS: No lymphadenopathy CARDIAC: Mechanical aortic valve closure sounds are crisp.  2/6 right upper sternal systolic but no diastolic murmur. RRR no gallop, or edema. VASCULAR:  Normal Pulses. No bruits. RESPIRATORY:  Clear to auscultation without rales, wheezing or rhonchi  ABDOMEN: Soft, non-tender, non-distended, No pulsatile mass, MUSCULOSKELETAL: No deformity  SKIN: Warm and dry NEUROLOGIC:  Alert and oriented x 3 PSYCHIATRIC:  Normal affect   ASSESSMENT:    1. H/O mechanical aortic valve replacement   2. Persistent atrial fibrillation ([place])   3. Coronary artery disease of bypass graft of native heart with stable angina pectoris ([place])   4. Essential hypertension   5. Thoracic aortic aneurysm without rupture ([place])   6. Secondary hypercoagulable state ([place])   7. Chronic anticoagulation    PLAN:    In order of problems listed above:  1. Clinically normal valve function. 2. Maintaining stable rhythm without clinical evidence of atrial fibrillation or flutter. 3. Secondary prevention discussed. 4. Excellent blood pressure for age at 56/68.  Continue diltiazem, furosemide, and Aldactone. 5. Ascending aorta measures 4 cm.  Needs to be followed up upon within the next 12 months. 6. Continue Coumadin therapy.    Medication Adjustments/Labs and Tests Ordered: Current medicines are reviewed at length with the patient today.  Concerns regarding medicines are outlined above.  No orders of the defined types were placed in this encounter.  No orders of the defined types were placed in this encounter.   There are  no Patient Instructions on file for this visit.   Signed, Sinclair Grooms, MD  12/16/2020 2:28 PM    [facility]

## 2020-12-23 DIAGNOSIS — N1330 Unspecified hydronephrosis: Secondary | ICD-10-CM | POA: Diagnosis not present

## 2020-12-23 DIAGNOSIS — R944 Abnormal results of kidney function studies: Secondary | ICD-10-CM | POA: Diagnosis not present

## 2020-12-23 DIAGNOSIS — N261 Atrophy of kidney (terminal): Secondary | ICD-10-CM | POA: Diagnosis not present

## 2020-12-23 DIAGNOSIS — R7989 Other specified abnormal findings of blood chemistry: Secondary | ICD-10-CM | POA: Diagnosis not present

## 2021-01-05 DIAGNOSIS — Z7901 Long term (current) use of anticoagulants: Secondary | ICD-10-CM | POA: Diagnosis not present

## 2021-01-28 ENCOUNTER — Other Ambulatory Visit: Payer: Self-pay

## 2021-01-28 ENCOUNTER — Encounter: Payer: Self-pay | Admitting: Emergency Medicine

## 2021-01-28 ENCOUNTER — Ambulatory Visit
Admission: EM | Admit: 2021-01-28 | Discharge: 2021-01-28 | Disposition: A | Discharge status: Home / Self Care | Payer: Medicare Other | Attending: Emergency Medicine | Admitting: Emergency Medicine

## 2021-01-28 DIAGNOSIS — N390 Urinary tract infection, site not specified: Secondary | ICD-10-CM | POA: Diagnosis not present

## 2021-01-28 DIAGNOSIS — R319 Hematuria, unspecified: Secondary | ICD-10-CM | POA: Insufficient documentation

## 2021-01-28 LAB — POCT URINALYSIS DIP (MANUAL ENTRY)
Bilirubin, UA: NEGATIVE
Glucose, UA: NEGATIVE mg/dL
Ketones, POC UA: NEGATIVE mg/dL
Nitrite, UA: NEGATIVE
Protein Ur, POC: NEGATIVE mg/dL
Spec Grav, UA: 1.02 (ref 1.010–1.025)
Urobilinogen, UA: 0.2 E.U./dL
pH, UA: 5.5 (ref 5.0–8.0)

## 2021-01-28 MED ORDER — SULFAMETHOXAZOLE-TRIMETHOPRIM 800-160 MG PO TABS: 1.0000 | ORAL_TABLET | Freq: Two times a day (BID) | ORAL | 0 refills | Status: AC

## 2021-01-28 NOTE — ED Triage Notes (Signed)
Pt sts dysuria starting at 0100 this am; pt sts voided multiple times

## 2021-01-28 NOTE — Discharge Instructions (Addendum)
Begin Bactrim twice daily for 1 week Drink plenty of fluids Please take Coumadin 5 mg daily and follow-up on Thursday as planned for your INR  Please return for any concerns

## 2021-01-28 NOTE — ED Provider Notes (Signed)
[facility]    CSN: [id number] Arrival date & time: 01/28/21  0806      History   Chief Complaint Chief Complaint  Patient presents with  . Dysuria    HPI Jason Santana is a 87 y.o. male history of CAD, A. fib, on Coumadin, hypertension, presenting today for evaluation of possible UTI.  Reports since 1 AM this morning has developed dysuria, urinary frequency urgency and incomplete voiding.  Does report history of prior UTIs.  Declines history of problems with prostate.  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  Is on Coumadin, last INR check was approximately 2 weeks ago and was supratherapeutic, had dosages adjusted to taking 7.5 mg on Wednesday and Sunday, 5 mg every other day.  HPI  Past Medical History:  Diagnosis Date  . Anticoagulation adequate 05/23/2019  . Atrial fibrillation ([place])   . CAD (coronary artery disease) of artery bypass graft 2000   2000 with multiple bypasses   . Chronic anticoagulation    Coumadin therapy for mechanical aortic valve. Postoperative atrial fibrillation.   . Essential hypertension   . H/O mechanical aortic valve replacement 2000   St Jude AVR  . Hypothyroidism   . Premature ventricular contractions   . S/P ablation of atrial fibrillation 05/22/19 05/23/2019  . Typical atrial flutter ([place]) 05/23/2019    Patient Active Problem List   Diagnosis Date Noted  . AKI (acute kidney injury) ([place]) 02/28/2020  . Symptomatic anemia 02/27/2020  . Thoracic aortic aneurysm ([place]) 11/08/2019  . Anticoagulation adequate 05/23/2019  . Typical atrial flutter ([place]) 05/23/2019  . S/P ablation of atrial fibrillation 05/22/19 05/23/2019  . Atrial fibrillation ([place]) 05/22/2019  . Chest pain 08/11/2018  . Syncope 01/04/2018  . Coronary artery disease involving native coronary artery of native heart with unstable angina pectoris ([place]) 01/04/2018  . GI bleed 01/03/2018  . Macular degeneration of left eye  05/26/2017  . Hypothyroidism 05/26/2017  . Hyponatremia 05/25/2017  . Sepsis ([place]) 05/25/2017  . Muscle weakness 12/13/2014  . Paroxysmal atrial flutter ([place]) 11/25/2013  . Essential hypertension 11/25/2013  . H/O mechanical aortic valve replacement 11/25/2013  . CAD (coronary artery disease) of artery bypass graft 11/25/2013  . Chronic anticoagulation 11/25/2013  . Premature ventricular contractions 11/25/2013    Past Surgical History:  Procedure Laterality Date  . AORTIC VALVE REPLACEMENT (AVR)/CORONARY ARTERY BYPASS GRAFTING (CABG)  2000  . ATRIAL FIBRILLATION ABLATION N/A 05/22/2019   Procedure: ATRIAL FIBRILLATION ABLATION;  Surgeon: Constance Haw, MD;  Location: [place] [facility];  Service: Cardiovascular;  Laterality: N/A;  . CARDIOVERSION N/A 02/24/2018   Procedure: CARDIOVERSION;  Surgeon: Fay Records, MD;  Location: [facility] ENDOSCOPY;  Service: Cardiovascular;  Laterality: N/A;  . CARDIOVERSION N/A 08/15/2018   Procedure: CARDIOVERSION;  Surgeon: Thayer Headings, MD;  Location: [place];  Service: Cardiovascular;  Laterality: N/A;  . COLONOSCOPY WITH PROPOFOL N/A 01/08/2018   Procedure: COLONOSCOPY WITH PROPOFOL;  Surgeon: Arta Silence, MD;  Location: [facility] ENDOSCOPY;  Service: Gastroenterology;  Laterality: N/A;  . CORONARY/GRAFT ANGIOGRAPHY N/A 08/14/2018   Procedure: CORONARY/GRAFT ANGIOGRAPHY;  Surgeon: [name], MD;  Location: [place] [facility];  Service: Cardiovascular;  Laterality: N/A;  . ESOPHAGOGASTRODUODENOSCOPY (EGD) WITH PROPOFOL N/A 01/06/2018   Procedure: ESOPHAGOGASTRODUODENOSCOPY (EGD) WITH PROPOFOL;  Surgeon: Otis Brace, MD;  Location: [facility] ENDOSCOPY;  Service: Gastroenterology;  Laterality: N/A;       Home Medications    Prior to  Admission medications   Medication Sig Start Date End Date Taking? Authorizing Provider  sulfamethoxazole-trimethoprim (BACTRIM DS) 800-160 MG tablet Take 1 tablet by mouth 2 (two) times daily for 7 days.  01/28/21 02/04/21 Yes Lesleyanne Politte C, PA-C  Cholecalciferol (VITAMIN D) 2000 units tablet Take 2,000 Units by mouth daily.    [provider]  diltiazem (CARDIZEM LA) 120 MG 24 hr tablet Take 1 tablet (120 mg total) by mouth daily. 09/27/20   Camnitz, Ocie Doyne, MD  diltiazem (CARDIZEM) 30 MG tablet TAKE 1 TABLET AS NEEDED FOR PALPITATIONS OR HEART RATE >100 10/05/19   Camnitz, Ocie Doyne, MD  diphenhydramine-acetaminophen (TYLENOL PM) 25-500 MG TABS tablet Take 0.5 tablets by mouth at bedtime as needed (sleep).    [provider]  ferrous sulfate 325 (65 FE) MG tablet Take 325 mg by mouth daily with supper.    [provider]  furosemide (LASIX) 20 MG tablet Take 20 mg by mouth as needed.    [provider]  levothyroxine (SYNTHROID) 125 MCG tablet Take 125 mcg by mouth daily before breakfast.  11/21/18   [provider]  Multiple Vitamins-Minerals (PRESERVISION AREDS 2 PO) Take 1 capsule by mouth 2 (two) times daily.    [provider]  pantoprazole (PROTONIX) 40 MG tablet Take 40 mg by mouth 4 (four) times a week.    [provider]  Polyethyl Glycol-Propyl Glycol (SYSTANE OP) Place 1 drop into both eyes daily as needed (dry eyes).    [provider]  spironolactone (ALDACTONE) 25 MG tablet Take 1 tablet (25 mg total) by mouth daily. 09/27/20   Camnitz, Will Hassell Done, MD  tobramycin (TOBREX) 0.3 % ophthalmic solution Place 1 drop into the left eye See admin instructions. Instill 1 drop into left eye 1 day before, the day of, and the day after eye injections administered at Dr's office    [provider]  valsartan (DIOVAN) 320 MG tablet TAKE 1 TABLET BY MOUTH EVERY DAY 09/19/20   Camnitz, Ocie Doyne, MD  warfarin (COUMADIN) 7.5 MG tablet Take 5-7.5 mg by mouth See admin instructions. Per patient taking 5mg  on Sundays    [provider]    Family History Family History  Problem Relation Age of Onset  .  Hypertension Mother   . Heart attack Mother   . Hypertension Father     Social History Social History   Tobacco Use  . Smoking status: Former Smoker    Types: Cigarettes  . Smokeless tobacco: Never Used  . Tobacco comment: quit 57 years ago  Vaping Use  . Vaping Use: Never used  Substance Use Topics  . Alcohol use: No  . Drug use: No     Allergies   Carvedilol, Lopid [gemfibrozil], Monopril [fosinopril], Vicodin [hydrocodone-acetaminophen], Phenergan [promethazine hcl], Zocor [simvastatin], Benicar [olmesartan], Codeine, and Prilosec [omeprazole]   Review of Systems Review of Systems  Constitutional: Negative for fever.  HENT: Negative for sore throat.   Respiratory: Negative for shortness of breath.   Cardiovascular: Negative for chest pain.  Gastrointestinal: Negative for abdominal pain, nausea and vomiting.  Genitourinary: Positive for dysuria, frequency and hematuria. Negative for difficulty urinating, penile discharge, penile pain, penile swelling, scrotal swelling and testicular pain.  Skin: Negative for rash.  Neurological: Negative for dizziness, light-headedness and headaches.     Physical Exam Triage Vital Signs ED Triage Vitals  Enc Vitals Group     BP      Pulse      Resp  Temp      Temp src      SpO2      Weight      Height      Head Circumference      Peak Flow      Pain Score      Pain Loc      Pain Edu?      Excl. in [place]?    No data found.  Updated Vital Signs BP (!) 116/58 (BP Location: Left Arm)   Pulse 85   Temp 98.6 F (37 C) (Oral)   Resp 18   SpO2 96%   Visual Acuity Right Eye Distance:   Left Eye Distance:   Bilateral Distance:    Right Eye Near:   Left Eye Near:    Bilateral Near:     Physical Exam Vitals and nursing note reviewed.  Constitutional:      Appearance: He is well-developed.     Comments: No acute distress  HENT:     Head: Normocephalic and atraumatic.     Nose: Nose normal.  Eyes:      Conjunctiva/sclera: Conjunctivae normal.  Cardiovascular:     Rate and Rhythm: Normal rate and regular rhythm.  Pulmonary:     Effort: Pulmonary effort is normal. No respiratory distress.     Comments: Breathing comfortably at rest, CTABL, no wheezing, rales or other adventitious sounds auscultated Abdominal:     General: There is no distension.     Comments: Soft, nondistended, nontender lightly palpation throughout abdomen  Musculoskeletal:        General: Normal range of motion.     Cervical back: Neck supple.  Skin:    General: Skin is warm and dry.  Neurological:     Mental Status: He is alert and oriented to person, place, and time.      UC Treatments / Results  Labs (all labs ordered are listed, but only abnormal results are displayed) Labs Reviewed  POCT URINALYSIS DIP (MANUAL ENTRY) - Abnormal; Notable for the following components:      Result Value   Clarity, UA cloudy (*)    Blood, UA small (*)    Leukocytes, UA Small (1+) (*)    All other components within normal limits  URINE CULTURE    EKG   Radiology No results found.  Procedures Procedures (including critical care time)  Medications Ordered in UC Medications - No data to display  Initial Impression / Assessment and Plan / UC Course  I have reviewed the triage vital signs and the nursing notes.  Pertinent labs & imaging results that were available during my care of the patient were reviewed by me and considered in my medical decision making (see chart for details).     UA with small leuks and blood, treating for UTI.  Placing on Bactrim, discussed concern over increasing INR, advised to stick with 5 mg daily and do not take the 7.5 on Wednesday and Sunday.  Patient has plans to have INR checked on 6/2, this Thursday.  Urine culture pending.  Discussed strict return precautions. Patient verbalized understanding and is agreeable with plan.    Final Clinical Impressions(s) / UC Diagnoses   Final  diagnoses:  Urinary tract infection with hematuria, site unspecified     Discharge Instructions     Begin Bactrim twice daily for 1 week Drink plenty of fluids Please take Coumadin 5 mg daily and follow-up on Thursday as planned for your INR  Please return  for any concerns    ED Prescriptions    Medication Sig Dispense Auth. Provider   sulfamethoxazole-trimethoprim (BACTRIM DS) 800-160 MG tablet Take 1 tablet by mouth 2 (two) times daily for 7 days. 14 tablet Wylma Tatem, [place] C, PA-C     PDMP not reviewed this encounter.   Janith Lima, [place] 01/28/21 [phone]

## 2021-01-30 LAB — URINE CULTURE: Culture: 20000 — AB

## 2021-02-02 DIAGNOSIS — Z7901 Long term (current) use of anticoagulants: Secondary | ICD-10-CM | POA: Diagnosis not present

## 2021-02-06 ENCOUNTER — Ambulatory Visit
Admission: EM | Admit: 2021-02-06 | Discharge: 2021-02-06 | Disposition: A | Discharge status: Home / Self Care | Payer: Medicare Other | Attending: Emergency Medicine | Admitting: Emergency Medicine

## 2021-02-06 ENCOUNTER — Encounter: Payer: Self-pay | Admitting: Emergency Medicine

## 2021-02-06 ENCOUNTER — Other Ambulatory Visit: Payer: Self-pay

## 2021-02-06 DIAGNOSIS — R300 Dysuria: Secondary | ICD-10-CM | POA: Insufficient documentation

## 2021-02-06 DIAGNOSIS — R3915 Urgency of urination: Secondary | ICD-10-CM | POA: Insufficient documentation

## 2021-02-06 LAB — POCT URINALYSIS DIP (MANUAL ENTRY)
Bilirubin, UA: NEGATIVE
Glucose, UA: NEGATIVE mg/dL
Ketones, POC UA: NEGATIVE mg/dL
Leukocytes, UA: NEGATIVE
Nitrite, UA: NEGATIVE
Protein Ur, POC: NEGATIVE mg/dL
Spec Grav, UA: 1.01 (ref 1.010–1.025)
Urobilinogen, UA: 0.2 E.U./dL
pH, UA: 6 (ref 5.0–8.0)

## 2021-02-06 MED ORDER — TRIAMCINOLONE ACETONIDE 0.1 % EX CREA: 1.0000 "application " | TOPICAL_CREAM | Freq: Two times a day (BID) | CUTANEOUS | 0 refills | Status: DC

## 2021-02-06 MED ORDER — CEPHALEXIN 500 MG PO CAPS: 500.0000 mg | ORAL_CAPSULE | Freq: Two times a day (BID) | ORAL | 0 refills | Status: AC

## 2021-02-06 MED ORDER — PHENAZOPYRIDINE HCL 200 MG PO TABS: 200.0000 mg | ORAL_TABLET | Freq: Three times a day (TID) | ORAL | 0 refills | Status: DC

## 2021-02-06 NOTE — Discharge Instructions (Signed)
Begin keflex twice daily for 5 days May use pyridium as needed three times daily for burning Follow up with primary care/urology Triamcinolone cream twice daily to rash on feet Please monitor for rash and muscle cramps to resolve Continue regular dose of coumadin

## 2021-02-06 NOTE — ED Provider Notes (Signed)
[facility]    CSN: [id number] Arrival date & time: 02/06/21  0813      History   Chief Complaint Chief Complaint  Patient presents with  . Dysuria    HPI Jason Santana is a 87 y.o. male history of hypertension, A. fib on Coumadin, presenting today for evaluation of UTI.  Patient was seen here approximately 1 week ago for similar symptoms and was started on Bactrim.  Urine culture grew out 20,000 colonies of E. coli, continued on Bactrim.  Presenting today for continued symptoms.  Reports dysuria in the evening, urgency during the day.  Denies history of issues with prostate.  Has had prior UTIs.  Completed course of Bactrim without problems, but does note that he developed lower leg cramping as well as rash to both feet with associated itching.  Had INR checked this past week and was 2.0, goal is 2.5-3.5.  Return to his normal dosing.  HPI  Past Medical History:  Diagnosis Date  . Anticoagulation adequate 05/23/2019  . Atrial fibrillation ([place])   . CAD (coronary artery disease) of artery bypass graft 2000   2000 with multiple bypasses   . Chronic anticoagulation    Coumadin therapy for mechanical aortic valve. Postoperative atrial fibrillation.   . Essential hypertension   . H/O mechanical aortic valve replacement 2000   St Jude AVR  . Hypothyroidism   . Premature ventricular contractions   . S/P ablation of atrial fibrillation 05/22/19 05/23/2019  . Typical atrial flutter ([place]) 05/23/2019    Patient Active Problem List   Diagnosis Date Noted  . AKI (acute kidney injury) ([place]) 02/28/2020  . Symptomatic anemia 02/27/2020  . Thoracic aortic aneurysm ([place]) 11/08/2019  . Anticoagulation adequate 05/23/2019  . Typical atrial flutter ([place]) 05/23/2019  . S/P ablation of atrial fibrillation 05/22/19 05/23/2019  . Atrial fibrillation ([place]) 05/22/2019  . Chest pain 08/11/2018  . Syncope 01/04/2018  . Coronary artery disease involving native coronary artery of native  heart with unstable angina pectoris ([place]) 01/04/2018  . GI bleed 01/03/2018  . Macular degeneration of left eye 05/26/2017  . Hypothyroidism 05/26/2017  . Hyponatremia 05/25/2017  . Sepsis ([place]) 05/25/2017  . Muscle weakness 12/13/2014  . Paroxysmal atrial flutter ([place]) 11/25/2013  . Essential hypertension 11/25/2013  . H/O mechanical aortic valve replacement 11/25/2013  . CAD (coronary artery disease) of artery bypass graft 11/25/2013  . Chronic anticoagulation 11/25/2013  . Premature ventricular contractions 11/25/2013    Past Surgical History:  Procedure Laterality Date  . AORTIC VALVE REPLACEMENT (AVR)/CORONARY ARTERY BYPASS GRAFTING (CABG)  2000  . ATRIAL FIBRILLATION ABLATION N/A 05/22/2019   Procedure: ATRIAL FIBRILLATION ABLATION;  Surgeon: Constance Haw, MD;  Location: [place] [facility];  Service: Cardiovascular;  Laterality: N/A;  . CARDIOVERSION N/A 02/24/2018   Procedure: CARDIOVERSION;  Surgeon: Fay Records, MD;  Location: [facility] ENDOSCOPY;  Service: Cardiovascular;  Laterality: N/A;  . CARDIOVERSION N/A 08/15/2018   Procedure: CARDIOVERSION;  Surgeon: Thayer Headings, MD;  Location: [place];  Service: Cardiovascular;  Laterality: N/A;  . COLONOSCOPY WITH PROPOFOL N/A 01/08/2018   Procedure: COLONOSCOPY WITH PROPOFOL;  Surgeon: Arta Silence, MD;  Location: [facility];  Service: Gastroenterology;  Laterality: N/A;  . CORONARY/GRAFT ANGIOGRAPHY N/A 08/14/2018   Procedure: CORONARY/GRAFT ANGIOGRAPHY;  Surgeon: [name], MD;  Location: [place] [facility];  Service: Cardiovascular;  Laterality: N/A;  . ESOPHAGOGASTRODUODENOSCOPY (EGD) WITH PROPOFOL N/A 01/06/2018   Procedure: ESOPHAGOGASTRODUODENOSCOPY (EGD) WITH PROPOFOL;  Surgeon: Otis Brace, MD;  Location: [facility] ENDOSCOPY;  Service: Gastroenterology;  Laterality: N/A;       Home Medications    Prior to Admission medications   Medication Sig Start Date End Date Taking? Authorizing Provider   cephALEXin (KEFLEX) 500 MG capsule Take 1 capsule (500 mg total) by mouth 2 (two) times daily for 5 days. 02/06/21 02/11/21 Yes Maeleigh Buschman C, PA-C  phenazopyridine (PYRIDIUM) 200 MG tablet Take 1 tablet (200 mg total) by mouth 3 (three) times daily. 02/06/21  Yes Serapio Edelson C, PA-C  triamcinolone cream (KENALOG) 0.1 % Apply 1 application topically 2 (two) times daily. 02/06/21  Yes Keyira Mondesir C, PA-C  Cholecalciferol (VITAMIN D) 2000 units tablet Take 2,000 Units by mouth daily.    [provider]  diltiazem (CARDIZEM LA) 120 MG 24 hr tablet Take 1 tablet (120 mg total) by mouth daily. 09/27/20   Camnitz, Ocie Doyne, MD  diltiazem (CARDIZEM) 30 MG tablet TAKE 1 TABLET AS NEEDED FOR PALPITATIONS OR HEART RATE >100 10/05/19   Camnitz, Ocie Doyne, MD  diphenhydramine-acetaminophen (TYLENOL PM) 25-500 MG TABS tablet Take 0.5 tablets by mouth at bedtime as needed (sleep).    [provider]  ferrous sulfate 325 (65 FE) MG tablet Take 325 mg by mouth daily with supper.    [provider]  furosemide (LASIX) 20 MG tablet Take 20 mg by mouth as needed.    [provider]  levothyroxine (SYNTHROID) 125 MCG tablet Take 125 mcg by mouth daily before breakfast.  11/21/18   [provider]  Multiple Vitamins-Minerals (PRESERVISION AREDS 2 PO) Take 1 capsule by mouth 2 (two) times daily.    [provider]  pantoprazole (PROTONIX) 40 MG tablet Take 40 mg by mouth 4 (four) times a week.    [provider]  Polyethyl Glycol-Propyl Glycol (SYSTANE OP) Place 1 drop into both eyes daily as needed (dry eyes).    [provider]  spironolactone (ALDACTONE) 25 MG tablet Take 1 tablet (25 mg total) by mouth daily. 09/27/20   Camnitz, Will Hassell Done, MD  tobramycin (TOBREX) 0.3 % ophthalmic solution Place 1 drop into the left eye See admin instructions. Instill 1 drop into left eye 1 day before, the day of, and the day after eye injections  administered at Dr's office    [provider]  valsartan (DIOVAN) 320 MG tablet TAKE 1 TABLET BY MOUTH EVERY DAY 09/19/20   Camnitz, Ocie Doyne, MD  warfarin (COUMADIN) 7.5 MG tablet Take 5-7.5 mg by mouth See admin instructions. Per patient taking 5mg  on Sundays    [provider]    Family History Family History  Problem Relation Age of Onset  . Hypertension Mother   . Heart attack Mother   . Hypertension Father     Social History Social History   Tobacco Use  . Smoking status: Former Smoker    Types: Cigarettes  . Smokeless tobacco: Never Used  . Tobacco comment: quit 57 years ago  Vaping Use  . Vaping Use: Never used  Substance Use Topics  . Alcohol use: No  . Drug use: No     Allergies   Carvedilol, Lopid [gemfibrozil], Monopril [fosinopril], Vicodin [hydrocodone-acetaminophen], Phenergan [promethazine hcl], Zocor [simvastatin], Benicar [olmesartan], Codeine, and Prilosec [omeprazole]   Review of Systems Review of Systems  Constitutional: Negative for fever.  HENT: Negative for sore throat.   Respiratory: Negative for shortness of breath.   Cardiovascular: Negative for chest pain.  Gastrointestinal: Negative for abdominal pain, nausea and  vomiting.  Genitourinary: Positive for dysuria and urgency. Negative for difficulty urinating, frequency, penile discharge, penile pain, penile swelling, scrotal swelling and testicular pain.  Skin: Negative for rash.  Neurological: Negative for dizziness, light-headedness and headaches.     Physical Exam Triage Vital Signs ED Triage Vitals  Enc Vitals Group     BP      Pulse      Resp      Temp      Temp src      SpO2      Weight      Height      Head Circumference      Peak Flow      Pain Score      Pain Loc      Pain Edu?      Excl. in [place]?    No data found.  Updated Vital Signs BP (!) 159/70 (BP Location: Left Arm)   Pulse 79   Temp (!) 97.5 F (36.4 C) (Oral)   Resp 18   SpO2 97%    Visual Acuity Right Eye Distance:   Left Eye Distance:   Bilateral Distance:    Right Eye Near:   Left Eye Near:    Bilateral Near:     Physical Exam Vitals and nursing note reviewed.  Constitutional:      Appearance: He is well-developed.     Comments: No acute distress  HENT:     Head: Normocephalic and atraumatic.     Nose: Nose normal.  Eyes:     Conjunctiva/sclera: Conjunctivae normal.  Cardiovascular:     Rate and Rhythm: Normal rate.  Pulmonary:     Effort: Pulmonary effort is normal. No respiratory distress.  Abdominal:     General: There is no distension.  Musculoskeletal:        General: Normal range of motion.     Cervical back: Neck supple.     Comments: Bilateral lower legs without swelling or erythema, does have some tenderness to palpation of calf bilaterally  Skin:    General: Skin is warm and dry.  Neurological:     Mental Status: He is alert and oriented to person, place, and time.      UC Treatments / Results  Labs (all labs ordered are listed, but only abnormal results are displayed) Labs Reviewed  POCT URINALYSIS DIP (MANUAL ENTRY) - Abnormal; Notable for the following components:      Result Value   Blood, UA trace-intact (*)    All other components within normal limits  URINE CULTURE    EKG   Radiology No results found.  Procedures Procedures (including critical care time)  Medications Ordered in UC Medications - No data to display  Initial Impression / Assessment and Plan / UC Course  I have reviewed the triage vital signs and the nursing notes.  Pertinent labs & imaging results that were available during my care of the patient were reviewed by me and considered in my medical decision making (see chart for details).     UA with negative leuks and nitrites today, trace hemoglobin.  Given patient's history as well as recent urine culture which was positive with persistent sitting still opting to proceed with alternative  antibiotic, will place on Keflex, safer than Cipro with Coumadin.  For Imodium as needed.  Discussed following up with primary care/urology for further evaluation if symptoms persisting and urine culture negative.  Possible drug rash to feet, triamcinolone topically, monitor for gradual  resolution.  No signs of blood clot, this is some bilateral calf tenderness without redness or swelling, recommending returning to normal dose of Coumadin and monitor for gradual resolution as well.  Discussed strict return precautions. Patient verbalized understanding and is agreeable with plan.  Final Clinical Impressions(s) / UC Diagnoses   Final diagnoses:  Dysuria  Urinary urgency     Discharge Instructions     Begin keflex twice daily for 5 days May use pyridium as needed three times daily for burning Follow up with primary care/urology Triamcinolone cream twice daily to rash on feet Please monitor for rash and muscle cramps to resolve Continue regular dose of coumadin     ED Prescriptions    Medication Sig Dispense Auth. Provider   cephALEXin (KEFLEX) 500 MG capsule Take 1 capsule (500 mg total) by mouth 2 (two) times daily for 5 days. 10 capsule Leigha Olberding C, PA-C   triamcinolone cream (KENALOG) 0.1 % Apply 1 application topically 2 (two) times daily. 30 g Aza Dantes C, PA-C   phenazopyridine (PYRIDIUM) 200 MG tablet Take 1 tablet (200 mg total) by mouth 3 (three) times daily. 6 tablet Elon Lomeli, [place] C, PA-C     PDMP not reviewed this encounter.   Janith Lima, [place] 02/06/21 [phone]

## 2021-02-06 NOTE — ED Triage Notes (Signed)
Pt here for dysuria and frequency; pt just completed antibiotics for UTI last Friday

## 2021-02-07 LAB — URINE CULTURE: Culture: NO GROWTH

## 2021-02-21 DIAGNOSIS — H348312 Tributary (branch) retinal vein occlusion, right eye, stable: Secondary | ICD-10-CM | POA: Diagnosis not present

## 2021-02-21 DIAGNOSIS — H353221 Exudative age-related macular degeneration, left eye, with active choroidal neovascularization: Secondary | ICD-10-CM | POA: Diagnosis not present

## 2021-02-21 DIAGNOSIS — H353112 Nonexudative age-related macular degeneration, right eye, intermediate dry stage: Secondary | ICD-10-CM | POA: Diagnosis not present

## 2021-02-21 DIAGNOSIS — H43813 Vitreous degeneration, bilateral: Secondary | ICD-10-CM | POA: Diagnosis not present

## 2021-03-07 DIAGNOSIS — Z7901 Long term (current) use of anticoagulants: Secondary | ICD-10-CM | POA: Diagnosis not present

## 2021-03-24 ENCOUNTER — Other Ambulatory Visit: Payer: Self-pay | Admitting: *Deleted

## 2021-03-24 DIAGNOSIS — I10 Essential (primary) hypertension: Secondary | ICD-10-CM

## 2021-03-24 DIAGNOSIS — Z79899 Other long term (current) drug therapy: Secondary | ICD-10-CM

## 2021-04-04 DIAGNOSIS — Z7901 Long term (current) use of anticoagulants: Secondary | ICD-10-CM | POA: Diagnosis not present

## 2021-04-13 DIAGNOSIS — R3912 Poor urinary stream: Secondary | ICD-10-CM | POA: Diagnosis not present

## 2021-04-13 DIAGNOSIS — N3020 Other chronic cystitis without hematuria: Secondary | ICD-10-CM | POA: Diagnosis not present

## 2021-05-05 DIAGNOSIS — Z7901 Long term (current) use of anticoagulants: Secondary | ICD-10-CM | POA: Diagnosis not present

## 2021-05-10 DIAGNOSIS — H353221 Exudative age-related macular degeneration, left eye, with active choroidal neovascularization: Secondary | ICD-10-CM | POA: Diagnosis not present

## 2021-05-10 DIAGNOSIS — H35372 Puckering of macula, left eye: Secondary | ICD-10-CM | POA: Diagnosis not present

## 2021-05-10 DIAGNOSIS — H353112 Nonexudative age-related macular degeneration, right eye, intermediate dry stage: Secondary | ICD-10-CM | POA: Diagnosis not present

## 2021-05-10 DIAGNOSIS — H43813 Vitreous degeneration, bilateral: Secondary | ICD-10-CM | POA: Diagnosis not present

## 2021-05-16 ENCOUNTER — Other Ambulatory Visit: Payer: Self-pay

## 2021-05-16 ENCOUNTER — Other Ambulatory Visit: Payer: Medicare Other | Admitting: *Deleted

## 2021-05-16 ENCOUNTER — Encounter: Payer: Self-pay | Admitting: Cardiology

## 2021-05-16 ENCOUNTER — Ambulatory Visit (INDEPENDENT_AMBULATORY_CARE_PROVIDER_SITE_OTHER): Payer: Medicare Other | Admitting: Cardiology

## 2021-05-16 VITALS — BP 140/68 | HR 77 | Ht 70.0 in | Wt 180.4 lb

## 2021-05-16 DIAGNOSIS — I25708 Atherosclerosis of coronary artery bypass graft(s), unspecified, with other forms of angina pectoris: Secondary | ICD-10-CM

## 2021-05-16 DIAGNOSIS — I10 Essential (primary) hypertension: Secondary | ICD-10-CM | POA: Diagnosis not present

## 2021-05-16 DIAGNOSIS — I4819 Other persistent atrial fibrillation: Secondary | ICD-10-CM | POA: Diagnosis not present

## 2021-05-16 DIAGNOSIS — I712 Thoracic aortic aneurysm, without rupture, unspecified: Secondary | ICD-10-CM

## 2021-05-16 DIAGNOSIS — Z79899 Other long term (current) drug therapy: Secondary | ICD-10-CM

## 2021-05-16 NOTE — Progress Notes (Signed)
Electrophysiology Office Note   Date:  05/16/2021   ID:  Jason Santana, DOB 1932-03-12, MRN [id number]  PCP:  [name], MD  Cardiologist:  Tamala Julian Primary Electrophysiologist:  Jcion Buddenhagen Meredith Leeds, MD    No chief complaint on file.    History of Present Illness: Jason Santana is a 87 y.o. male who is being seen today for the evaluation of atrial fibrillation at the request of [name], MD. Presenting today for electrophysiology evaluation.    He has a history of mechanical aortic valve replacement in 2000, atrial fibrillation, atrial flutter, and coronary artery disease.    He has atrial fibrillation with symptoms of lightheadedness and fatigue.  He was started on amiodarone December 2018.  He presented emergency room 04/10/2019 with palpitations and was found to be in atrial fibrillation.  He is now status post ablation 05/22/2019.  Today, denies symptoms of palpitations, chest pain, shortness of breath, orthopnea, PND, lower extremity edema, claudication, dizziness, presyncope, syncope, bleeding, or neurologic sequela. The patient is tolerating medications without difficulties.  Since being seen he has done well.  He does have some fatigue.  He is in sinus rhythm today and does not feel that he has been back in atrial fibrillation.  Other than his fatigue he feels well.  He currently takes diltiazem in the morning, he Juwon Scripter switch it to nighttime dosing.     Past Medical History:  Diagnosis Date   Anticoagulation adequate 05/23/2019   Atrial fibrillation ([place])    CAD (coronary artery disease) of artery bypass graft 2000   2000 with multiple bypasses    Chronic anticoagulation    Coumadin therapy for mechanical aortic valve. Postoperative atrial fibrillation.    Essential hypertension    H/O mechanical aortic valve replacement 2000   St Jude AVR   Hypothyroidism    Premature ventricular contractions    S/P ablation of atrial fibrillation 05/22/19 05/23/2019   Typical atrial  flutter ([place]) 05/23/2019   Past Surgical History:  Procedure Laterality Date   AORTIC VALVE REPLACEMENT (AVR)/CORONARY ARTERY BYPASS GRAFTING (CABG)  2000   ATRIAL FIBRILLATION ABLATION N/A 05/22/2019   Procedure: ATRIAL FIBRILLATION ABLATION;  Surgeon: Constance Haw, MD;  Location: [place] [facility];  Service: Cardiovascular;  Laterality: N/A;   CARDIOVERSION N/A 02/24/2018   Procedure: CARDIOVERSION;  Surgeon: Fay Records, MD;  Location: [facility]OSCOPY;  Service: Cardiovascular;  Laterality: N/A;   CARDIOVERSION N/A 08/15/2018   Procedure: CARDIOVERSION;  Surgeon: Thayer Headings, MD;  Location: [place];  Service: Cardiovascular;  Laterality: N/A;   COLONOSCOPY WITH PROPOFOL N/A 01/08/2018   Procedure: COLONOSCOPY WITH PROPOFOL;  Surgeon: Arta Silence, MD;  Location: [facility]COPY;  Service: Gastroenterology;  Laterality: N/A;   CORONARY/GRAFT ANGIOGRAPHY N/A 08/14/2018   Procedure: CORONARY/GRAFT ANGIOGRAPHY;  Surgeon: [name], MD;  Location: [place] [facility];  Service: Cardiovascular;  Laterality: N/A;   ESOPHAGOGASTRODUODENOSCOPY (EGD) WITH PROPOFOL N/A 01/06/2018   Procedure: ESOPHAGOGASTRODUODENOSCOPY (EGD) WITH PROPOFOL;  Surgeon: Otis Brace, MD;  Location: [facility];  Service: Gastroenterology;  Laterality: N/A;     Current Outpatient Medications  Medication Sig Dispense Refill   Cholecalciferol (VITAMIN D) 2000 units tablet Take 2,000 Units by mouth daily.     diltiazem (CARDIZEM LA) 120 MG 24 hr tablet Take 1 tablet (120 mg total) by mouth daily. 90 tablet 3   diltiazem (CARDIZEM) 30 MG tablet TAKE 1 TABLET AS NEEDED FOR PALPITATIONS OR HEART RATE >100 30 tablet 6   diphenhydramine-acetaminophen (  TYLENOL PM) 25-500 MG TABS tablet Take 0.5 tablets by mouth at bedtime as needed (sleep).     ferrous sulfate 325 (65 FE) MG tablet Take 325 mg by mouth daily with supper.     furosemide (LASIX) 20 MG tablet Take 20 mg by mouth as needed.     levothyroxine  (SYNTHROID) 125 MCG tablet Take 125 mcg by mouth daily before breakfast.      Multiple Vitamins-Minerals (PRESERVISION AREDS 2 PO) Take 1 capsule by mouth 2 (two) times daily.     pantoprazole (PROTONIX) 40 MG tablet Take 40 mg by mouth 4 (four) times a week.     phenazopyridine (PYRIDIUM) 200 MG tablet Take 1 tablet (200 mg total) by mouth 3 (three) times daily. 6 tablet 0   Polyethyl Glycol-Propyl Glycol (SYSTANE OP) Place 1 drop into both eyes daily as needed (dry eyes).     spironolactone (ALDACTONE) 25 MG tablet Take 1 tablet (25 mg total) by mouth daily. 90 tablet 3   tobramycin (TOBREX) 0.3 % ophthalmic solution Place 1 drop into the left eye See admin instructions. Instill 1 drop into left eye 1 day before, the day of, and the day after eye injections administered at Dr's office     triamcinolone cream (KENALOG) 0.1 % Apply 1 application topically 2 (two) times daily. 30 g 0   valsartan (DIOVAN) 320 MG tablet TAKE 1 TABLET BY MOUTH EVERY DAY 90 tablet 2   warfarin (COUMADIN) 7.5 MG tablet Take 5-7.5 mg by mouth See admin instructions. Per patient taking '5mg'$  on Sundays     No current facility-administered medications for this visit.    Allergies:   Carvedilol, Lopid [gemfibrozil], Monopril [fosinopril], Vicodin [hydrocodone-acetaminophen], Phenergan [promethazine hcl], Zocor [simvastatin], Benicar [olmesartan], Codeine, and Prilosec [omeprazole]   Social History:  The patient  reports that he has quit smoking. His smoking use included cigarettes. He has never used smokeless tobacco. He reports that he does not drink alcohol and does not use drugs.   Family History:  The patient's family history includes Heart attack in his mother; Hypertension in his father and mother.   ROS:  Please see the history of present illness.   Otherwise, review of systems is positive for none.   All other systems are reviewed and negative.   PHYSICAL EXAM: VS:  BP 140/68   Pulse 77   Ht '5\' 10"'$  (1.778 m)    Wt 180 lb 6.4 oz (81.8 kg)   SpO2 98%   BMI 25.88 kg/m  , BMI Body mass index is 25.88 kg/m. GEN: Well nourished, well developed, in no acute distress  HEENT: normal  Neck: no JVD, carotid bruits, or masses Cardiac: RRR; no murmurs, rubs, or gallops,no edema  Respiratory:  clear to auscultation bilaterally, normal work of breathing GI: soft, nontender, nondistended, + BS MS: no deformity or atrophy  Skin: warm and dry Neuro:  Strength and sensation are intact Psych: euthymic mood, full affect  EKG:  EKG is ordered today. Personal review of the ekg ordered shows sinus rhythm, PVCs  Recent Labs: 10/04/2020: BUN 24; Creatinine, Ser 1.29; Potassium 4.9; Sodium 137    Lipid Panel     Component Value Date/Time   CHOL 130 04/15/2019 0905   TRIG 157 (H) 04/15/2019 0905   HDL 30 (L) 04/15/2019 0905   CHOLHDL 4.3 04/15/2019 0905   CHOLHDL 7.7 08/12/2018 0225   VLDL 48 (H) 08/12/2018 0225   LDLCALC 69 04/15/2019 0905     Wt Readings  from Last 3 Encounters:  05/16/21 180 lb 6.4 oz (81.8 kg)  12/16/20 181 lb 3.2 oz (82.2 kg)  11/11/20 183 lb (83 kg)      Other studies Reviewed: Additional studies/ records that were reviewed today include: TTE 08/12/18  Review of the above records today demonstrates:  - Left ventricle: The cavity size was normal. There was mild   concentric hypertrophy. Systolic function was normal. The   estimated ejection fraction was in the range of 55% to 60%. Wall   motion was normal; there were no regional wall motion   abnormalities. - Ventricular septum: Septal motion showed paradox. These changes   are consistent with a post-thoracotomy state. - Aortic valve: A mechanical prosthesis was present and functioning   normally. - Mitral valve: Calcified annulus. There was mild regurgitation. - Left atrium: The atrium was mildly dilated. - Right ventricle: Systolic function was mildly to moderately   reduced. - Right atrium: The atrium was mildly  dilated.  LHC 08/14/18 Mid RCA lesion is 100% stenosed. Ost 2nd Mrg lesion is 100% stenosed. Prox LAD to Mid LAD lesion is 50% stenosed. SVG. The graft exhibits mild .  1.  Severe two-vessel coronary artery disease with total occlusion of the OM 2 and the mid RCA, nonobstructive LAD stenosis 2.  Status post remote aortocoronary bypass surgery with continued patency of the LIMA to LAD, saphenous vein graft OM 2, and saphenous vein graft to PDA  ASSESSMENT AND PLAN:  1.  Persistent atrial fibrillation: Currently on warfarin.  Status post ablation 05/22/2019.  CHA2DS2-VASc of 4.  Has not no further episodes.  No changes.  2.  Mechanical aortic valve: Stable on most recent echo.  Plan per primary cardiology.  3.  Coronary artery disease: No current chest pain  4.  Hypertension: Currently well controlled  5.  Aortic aneurysm: Found on CT scan.  We Rhaelyn Giron order a CTA today.  Current medicines are reviewed at length with the patient today.   The patient does not have concerns regarding his medicines.  The following changes were made today: None  Labs/ tests ordered today include:  Orders Placed This Encounter  Procedures   CT ANGIO CHEST AORTA W/CM & OR WO/CM   EKG 12-Lead     Disposition:   FU with Raghav Verrilli 6 months  Signed, Chyla Schlender Meredith Leeds, MD  05/16/2021 2:31 PM     [facility] [address] [place] [address] [phone] (office) [phone] (fax)

## 2021-05-16 NOTE — Patient Instructions (Addendum)
Medication Instructions:  Your physician has recommended you make the following change in your medication:  TAKE Diltiazem at night  *If you need a refill on your cardiac medications before your next appointment, please call your pharmacy*   Lab Work: None ordered   Testing/Procedures: Non-Cardiac CT scanning, (CAT scanning), is a noninvasive, special x-ray that produces cross-sectional images of the body using x-rays and a computer. CT scans help physicians diagnose and treat medical conditions. For some CT exams, a contrast material is used to enhance visibility in the area of the body being studied. CT scans provide greater clarity and reveal more details than regular x-ray exams.    Follow-Up: At [facility], you and your health needs are our priority.  As part of our continuing mission to provide you with exceptional heart care, we have created designated Provider Care Teams.  These Care Teams include your primary Cardiologist (physician) and Advanced Practice Providers (APPs -  Physician Assistants and Nurse Practitioners) who all work together to provide you with the care you need, when you need it.  We recommend signing up for the patient portal called "MyChart".  Sign up information is provided on this After Visit Summary.  MyChart is used to connect with patients for Virtual Visits (Telemedicine).  Patients are able to view lab/test results, encounter notes, upcoming appointments, etc.  Non-urgent messages can be sent to your provider as well.   To learn more about what you can do with MyChart, go to [email or web address].    Your next appointment:   7 month(s)  The format for your next appointment:   In Person  Provider:   Allegra Lai, MD    Thank you for choosing [facility]!!   Trinidad Curet, RN [phone]   Other Instructions

## 2021-05-17 LAB — BASIC METABOLIC PANEL
BUN/Creatinine Ratio: 16 (ref 10–24)
BUN: 25 mg/dL (ref 8–27)
CO2: 20 mmol/L (ref 20–29)
Calcium: 9.5 mg/dL (ref 8.6–10.2)
Chloride: 102 mmol/L (ref 96–106)
Creatinine, Ser: 1.53 mg/dL — ABNORMAL HIGH (ref 0.76–1.27)
Glucose: 173 mg/dL — ABNORMAL HIGH (ref 65–99)
Potassium: 4.8 mmol/L (ref 3.5–5.2)
Sodium: 137 mmol/L (ref 134–144)
eGFR: 44 mL/min/{1.73_m2} — ABNORMAL LOW (ref >59)

## 2021-05-18 DIAGNOSIS — D6869 Other thrombophilia: Secondary | ICD-10-CM | POA: Diagnosis not present

## 2021-05-18 DIAGNOSIS — R0989 Other specified symptoms and signs involving the circulatory and respiratory systems: Secondary | ICD-10-CM | POA: Diagnosis not present

## 2021-05-18 DIAGNOSIS — Z1389 Encounter for screening for other disorder: Secondary | ICD-10-CM | POA: Diagnosis not present

## 2021-05-18 DIAGNOSIS — N5201 Erectile dysfunction due to arterial insufficiency: Secondary | ICD-10-CM | POA: Diagnosis not present

## 2021-05-18 DIAGNOSIS — I4891 Unspecified atrial fibrillation: Secondary | ICD-10-CM | POA: Diagnosis not present

## 2021-05-18 DIAGNOSIS — E782 Mixed hyperlipidemia: Secondary | ICD-10-CM | POA: Diagnosis not present

## 2021-05-18 DIAGNOSIS — E039 Hypothyroidism, unspecified: Secondary | ICD-10-CM | POA: Diagnosis not present

## 2021-05-18 DIAGNOSIS — I2510 Atherosclerotic heart disease of native coronary artery without angina pectoris: Secondary | ICD-10-CM | POA: Diagnosis not present

## 2021-05-18 DIAGNOSIS — N401 Enlarged prostate with lower urinary tract symptoms: Secondary | ICD-10-CM | POA: Diagnosis not present

## 2021-05-18 DIAGNOSIS — I10 Essential (primary) hypertension: Secondary | ICD-10-CM | POA: Diagnosis not present

## 2021-05-18 DIAGNOSIS — E559 Vitamin D deficiency, unspecified: Secondary | ICD-10-CM | POA: Diagnosis not present

## 2021-05-18 DIAGNOSIS — Z23 Encounter for immunization: Secondary | ICD-10-CM | POA: Diagnosis not present

## 2021-05-18 DIAGNOSIS — G609 Hereditary and idiopathic neuropathy, unspecified: Secondary | ICD-10-CM | POA: Diagnosis not present

## 2021-05-18 DIAGNOSIS — Z0000 Encounter for general adult medical examination without abnormal findings: Secondary | ICD-10-CM | POA: Diagnosis not present

## 2021-05-18 DIAGNOSIS — H3530 Unspecified macular degeneration: Secondary | ICD-10-CM | POA: Diagnosis not present

## 2021-05-19 DIAGNOSIS — Z87891 Personal history of nicotine dependence: Secondary | ICD-10-CM | POA: Diagnosis not present

## 2021-05-19 DIAGNOSIS — I10 Essential (primary) hypertension: Secondary | ICD-10-CM | POA: Diagnosis not present

## 2021-05-19 DIAGNOSIS — R55 Syncope and collapse: Secondary | ICD-10-CM | POA: Diagnosis not present

## 2021-05-19 DIAGNOSIS — R0989 Other specified symptoms and signs involving the circulatory and respiratory systems: Secondary | ICD-10-CM | POA: Diagnosis not present

## 2021-05-30 ENCOUNTER — Other Ambulatory Visit: Payer: Self-pay

## 2021-05-30 ENCOUNTER — Encounter (HOSPITAL_COMMUNITY): Payer: Self-pay

## 2021-05-30 ENCOUNTER — Ambulatory Visit (HOSPITAL_COMMUNITY)
Admission: RE | Admit: 2021-05-30 | Discharge: 2021-05-30 | Disposition: A | Discharge status: Home / Self Care | Payer: Medicare Other | Source: Ambulatory Visit | Attending: Cardiology | Admitting: Cardiology

## 2021-05-30 DIAGNOSIS — I712 Thoracic aortic aneurysm, without rupture, unspecified: Secondary | ICD-10-CM

## 2021-05-30 DIAGNOSIS — I700 Atherosclerosis of aorta: Secondary | ICD-10-CM | POA: Diagnosis not present

## 2021-05-30 MED ORDER — IOHEXOL 350 MG/ML SOLN
100.0000 mL | Freq: Once | INTRAVENOUS | Status: AC | PRN
  Administered 2021-05-30: 80 mL via INTRAVENOUS

## 2021-06-09 DIAGNOSIS — Z7901 Long term (current) use of anticoagulants: Secondary | ICD-10-CM | POA: Diagnosis not present

## 2021-06-09 DIAGNOSIS — R932 Abnormal findings on diagnostic imaging of liver and biliary tract: Secondary | ICD-10-CM | POA: Diagnosis not present

## 2021-06-12 ENCOUNTER — Emergency Department (HOSPITAL_COMMUNITY)
Admission: EM | Admit: 2021-06-12 | Discharge: 2021-06-13 | Disposition: A | Discharge status: Home / Self Care | Payer: Medicare Other | Attending: Emergency Medicine | Admitting: Emergency Medicine

## 2021-06-12 ENCOUNTER — Encounter: Payer: Self-pay | Admitting: Emergency Medicine

## 2021-06-12 ENCOUNTER — Other Ambulatory Visit: Payer: Self-pay | Admitting: Cardiology

## 2021-06-12 ENCOUNTER — Other Ambulatory Visit: Payer: Self-pay

## 2021-06-12 ENCOUNTER — Encounter (HOSPITAL_COMMUNITY): Payer: Self-pay

## 2021-06-12 ENCOUNTER — Ambulatory Visit
Admission: EM | Admit: 2021-06-12 | Discharge: 2021-06-12 | Disposition: A | Discharge status: Other Acute Inpatient Hospital | Payer: Medicare Other | Attending: Physician Assistant | Admitting: Physician Assistant

## 2021-06-12 DIAGNOSIS — Z5321 Procedure and treatment not carried out due to patient leaving prior to being seen by health care provider: Secondary | ICD-10-CM | POA: Diagnosis not present

## 2021-06-12 DIAGNOSIS — I10 Essential (primary) hypertension: Secondary | ICD-10-CM | POA: Diagnosis not present

## 2021-06-12 DIAGNOSIS — R42 Dizziness and giddiness: Secondary | ICD-10-CM | POA: Insufficient documentation

## 2021-06-12 LAB — URINALYSIS, ROUTINE W REFLEX MICROSCOPIC
Bacteria, UA: NONE SEEN
Bilirubin Urine: NEGATIVE
Glucose, UA: NEGATIVE mg/dL
Hgb urine dipstick: NEGATIVE
Ketones, ur: NEGATIVE mg/dL
Nitrite: NEGATIVE
Protein, ur: NEGATIVE mg/dL
Specific Gravity, Urine: 1.01 (ref 1.005–1.030)
pH: 5 (ref 5.0–8.0)

## 2021-06-12 LAB — CBC WITH DIFFERENTIAL/PLATELET
Abs Immature Granulocytes: 0.01 10*3/uL (ref 0.00–0.07)
Basophils Absolute: 0 10*3/uL (ref 0.0–0.1)
Basophils Relative: 1 %
Eosinophils Absolute: 0.3 10*3/uL (ref 0.0–0.5)
Eosinophils Relative: 4 %
HCT: 35.9 % — ABNORMAL LOW (ref 39.0–52.0)
Hemoglobin: 11.8 g/dL — ABNORMAL LOW (ref 13.0–17.0)
Immature Granulocytes: 0 %
Lymphocytes Relative: 17 %
Lymphs Abs: 1 10*3/uL (ref 0.7–4.0)
MCH: 34.3 pg — ABNORMAL HIGH (ref 26.0–34.0)
MCHC: 32.9 g/dL (ref 30.0–36.0)
MCV: 104.4 fL — ABNORMAL HIGH (ref 80.0–100.0)
Monocytes Absolute: 0.6 10*3/uL (ref 0.1–1.0)
Monocytes Relative: 9 %
Neutro Abs: 4.3 10*3/uL (ref 1.7–7.7)
Neutrophils Relative %: 69 %
Platelets: 218 10*3/uL (ref 150–400)
RBC: 3.44 MIL/uL — ABNORMAL LOW (ref 4.22–5.81)
RDW: 13.7 % (ref 11.5–15.5)
WBC: 6.2 10*3/uL (ref 4.0–10.5)
nRBC: 0 % (ref 0.0–0.2)

## 2021-06-12 LAB — POCT URINALYSIS DIP (MANUAL ENTRY)
Bilirubin, UA: NEGATIVE
Glucose, UA: NEGATIVE mg/dL
Ketones, POC UA: NEGATIVE mg/dL
Leukocytes, UA: NEGATIVE
Nitrite, UA: NEGATIVE
Protein Ur, POC: NEGATIVE mg/dL
Spec Grav, UA: 1.02 (ref 1.010–1.025)
Urobilinogen, UA: 0.2 E.U./dL
pH, UA: 6 (ref 5.0–8.0)

## 2021-06-12 LAB — COMPREHENSIVE METABOLIC PANEL
ALT: 25 U/L (ref 0–44)
AST: 52 U/L — ABNORMAL HIGH (ref 15–41)
Albumin: 4.2 g/dL (ref 3.5–5.0)
Alkaline Phosphatase: 75 U/L (ref 38–126)
Anion gap: 8 (ref 5–15)
BUN: 19 mg/dL (ref 8–23)
CO2: 23 mmol/L (ref 22–32)
Calcium: 9.6 mg/dL (ref 8.9–10.3)
Chloride: 103 mmol/L (ref 98–111)
Creatinine, Ser: 1.19 mg/dL (ref 0.61–1.24)
GFR, Estimated: 59 mL/min — ABNORMAL LOW (ref >60)
Glucose, Bld: 128 mg/dL — ABNORMAL HIGH (ref 70–99)
Potassium: 4.5 mmol/L (ref 3.5–5.1)
Sodium: 134 mmol/L — ABNORMAL LOW (ref 135–145)
Total Bilirubin: 1.2 mg/dL (ref 0.3–1.2)
Total Protein: 8.1 g/dL (ref 6.5–8.1)

## 2021-06-12 LAB — MAGNESIUM: Magnesium: 1.9 mg/dL (ref 1.7–2.4)

## 2021-06-12 LAB — TSH: TSH: 0.757 u[IU]/mL (ref 0.350–4.500)

## 2021-06-12 NOTE — ED Triage Notes (Signed)
Patient states that when he got up this morning, he became really dizzy.  Patient checked his BP 187/72, patient has been having a lot of urgency and frequency with urination as well.

## 2021-06-12 NOTE — ED Provider Notes (Signed)
[facility]    CSN: [id number] Arrival date & time: 06/12/21  1741      History   Chief Complaint Chief Complaint  Patient presents with   Dizziness    HPI Jason Santana is a 87 y.o. male.   Patient here today for evaluation of dizziness that started this morning.  He reports is not currently dizzy, but states he is worried because his blood pressure seem to be elevated throughout the day.  He has significant medical history involving both cardiac and thyroid issues.  He states when he has issues with infections will have elevated blood pressures, and is concerned maybe he has a UTI.  He denies any chest pain or shortness of breath.  He has not had any headaches.  His brother drove him here today.  The history is provided by the patient.  Dizziness Associated symptoms: no chest pain, no headaches, no nausea, no shortness of breath and no vomiting    Past Medical History:  Diagnosis Date   Anticoagulation adequate 05/23/2019   Atrial fibrillation ([facility])    CAD (coronary artery disease) of artery bypass graft 2000   2000 with multiple bypasses    Chronic anticoagulation    Coumadin therapy for mechanical aortic valve. Postoperative atrial fibrillation.    Essential hypertension    H/O mechanical aortic valve replacement 2000   St Jude AVR   Hypothyroidism    Premature ventricular contractions    S/P ablation of atrial fibrillation 05/22/19 05/23/2019   Typical atrial flutter ([place]) 05/23/2019    Patient Active Problem List   Diagnosis Date Noted   AKI (acute kidney injury) ([place]) 02/28/2020   Symptomatic anemia 02/27/2020   Thoracic aortic aneurysm 11/08/2019   Anticoagulation adequate 05/23/2019   Typical atrial flutter ([place]) 05/23/2019   S/P ablation of atrial fibrillation 05/22/19 05/23/2019   Atrial fibrillation ([place]) 05/22/2019   Chest pain 08/11/2018   Syncope 01/04/2018   Coronary artery disease involving native coronary artery of native heart with  unstable angina pectoris ([place]) 01/04/2018   GI bleed 01/03/2018   Macular degeneration of left eye 05/26/2017   Hypothyroidism 05/26/2017   Hyponatremia 05/25/2017   Sepsis ([place]) 05/25/2017   Muscle weakness 12/13/2014   Paroxysmal atrial flutter ([place]) 11/25/2013   Essential hypertension 11/25/2013   H/O mechanical aortic valve replacement 11/25/2013   CAD (coronary artery disease) of artery bypass graft 11/25/2013   Chronic anticoagulation 11/25/2013   Premature ventricular contractions 11/25/2013    Past Surgical History:  Procedure Laterality Date   AORTIC VALVE REPLACEMENT (AVR)/CORONARY ARTERY BYPASS GRAFTING (CABG)  2000   ATRIAL FIBRILLATION ABLATION N/A 05/22/2019   Procedure: ATRIAL FIBRILLATION ABLATION;  Surgeon: Constance Haw, MD;  Location: [place] [facility];  Service: Cardiovascular;  Laterality: N/A;   CARDIOVERSION N/A 02/24/2018   Procedure: CARDIOVERSION;  Surgeon: Fay Records, MD;  Location: [facility] ENDOSCOPY;  Service: Cardiovascular;  Laterality: N/A;   CARDIOVERSION N/A 08/15/2018   Procedure: CARDIOVERSION;  Surgeon: Thayer Headings, MD;  Location: [place];  Service: Cardiovascular;  Laterality: N/A;   COLONOSCOPY WITH PROPOFOL N/A 01/08/2018   Procedure: COLONOSCOPY WITH PROPOFOL;  Surgeon: Arta Silence, MD;  Location: [facility];  Service: Gastroenterology;  Laterality: N/A;   CORONARY/GRAFT ANGIOGRAPHY N/A 08/14/2018   Procedure: CORONARY/GRAFT ANGIOGRAPHY;  Surgeon: [name], MD;  Location: [place] [facility];  Service: Cardiovascular;  Laterality: N/A;   ESOPHAGOGASTRODUODENOSCOPY (EGD) WITH PROPOFOL N/A 01/06/2018   Procedure: ESOPHAGOGASTRODUODENOSCOPY (EGD) WITH PROPOFOL;  Surgeon:  Otis Brace, MD;  Location: [facility]OS[facility];  Service: Gastroenterology;  Laterality: N/A;       Home Medications    Prior to Admission medications   Medication Sig Start Date End Date Taking? Authorizing Provider  Cholecalciferol (VITAMIN D) 2000  units tablet Take 2,000 Units by mouth daily.   Yes [provider]  diltiazem (CARDIZEM LA) 120 MG 24 hr tablet Take 1 tablet (120 mg total) by mouth daily. 09/27/20  Yes Camnitz, Will Hassell Done, MD  diltiazem (CARDIZEM) 30 MG tablet TAKE 1 TABLET AS NEEDED FOR PALPITATIONS OR HEART RATE >100 10/05/19  Yes Camnitz, Will Hassell Done, MD  diphenhydramine-acetaminophen (TYLENOL PM) 25-500 MG TABS tablet Take 0.5 tablets by mouth at bedtime as needed (sleep).   Yes [provider]  ferrous sulfate 325 (65 FE) MG tablet Take 325 mg by mouth daily with supper.   Yes [provider]  furosemide (LASIX) 20 MG tablet Take 20 mg by mouth as needed.   Yes [provider]  levothyroxine (SYNTHROID) 125 MCG tablet Take 125 mcg by mouth daily before breakfast.  11/21/18  Yes [provider]  Multiple Vitamins-Minerals (PRESERVISION AREDS 2 PO) Take 1 capsule by mouth 2 (two) times daily.   Yes [provider]  pantoprazole (PROTONIX) 40 MG tablet Take 40 mg by mouth 4 (four) times a week.   Yes [provider]  phenazopyridine (PYRIDIUM) 200 MG tablet Take 1 tablet (200 mg total) by mouth 3 (three) times daily. 02/06/21  Yes Wieters, Hallie C, PA-C  Polyethyl Glycol-Propyl Glycol (SYSTANE OP) Place 1 drop into both eyes daily as needed (dry eyes).   Yes [provider]  spironolactone (ALDACTONE) 25 MG tablet Take 1 tablet (25 mg total) by mouth daily. 09/27/20  Yes Camnitz, Will Hassell Done, MD  tobramycin (TOBREX) 0.3 % ophthalmic solution Place 1 drop into the left eye See admin instructions. Instill 1 drop into left eye 1 day before, the day of, and the day after eye injections administered at Dr's office   Yes [provider]  triamcinolone cream (KENALOG) 0.1 % Apply 1 application topically 2 (two) times daily. 02/06/21  Yes Wieters, Hallie C, PA-C  valsartan (DIOVAN) 320 MG tablet TAKE 1 TABLET BY MOUTH EVERY DAY 09/19/20  Yes Camnitz, Ocie Doyne, MD   warfarin (COUMADIN) 7.5 MG tablet Take 5-7.5 mg by mouth See admin instructions. Per patient taking 5mg  on Sundays   Yes [provider]    Family History Family History  Problem Relation Age of Onset   Hypertension Mother    Heart attack Mother    Hypertension Father     Social History Social History   Tobacco Use   Smoking status: Former    Types: Cigarettes   Smokeless tobacco: Never   Tobacco comments:    quit 57 years ago  Vaping Use   Vaping Use: Never used  Substance Use Topics   Alcohol use: No   Drug use: No     Allergies   Carvedilol, Lopid [gemfibrozil], Monopril [fosinopril], Vicodin [hydrocodone-acetaminophen], Phenergan [promethazine hcl], Zocor [simvastatin], Benicar [olmesartan], Codeine, and Prilosec [omeprazole]   Review of Systems Review of Systems  Constitutional:  Negative for chills and fever.  Eyes:  Negative for discharge and redness.  Respiratory:  Negative for shortness of breath and wheezing.   Cardiovascular:  Negative for chest pain.  Gastrointestinal:  Negative for nausea and vomiting.  Neurological:  Positive for dizziness. Negative for headaches.    Physical Exam Triage  Vital Signs ED Triage Vitals  Enc Vitals Group     BP 06/12/21 1916 (!) 179/65     Pulse Rate 06/12/21 1916 81     Resp --      Temp 06/12/21 1916 97.7 F (36.5 C)     Temp Source 06/12/21 1916 Oral     SpO2 06/12/21 1916 96 %     Weight 06/12/21 1917 180 lb (81.6 kg)     Height 06/12/21 1917 5\' 10"  (1.778 m)     Head Circumference --      Peak Flow --      Pain Score 06/12/21 1917 0     Pain Loc --      Pain Edu? --      Excl. in [place]? --    No data found.  Updated Vital Signs BP (!) 179/65 (BP Location: Right Arm)   Pulse 81   Temp 97.7 F (36.5 C) (Oral)   Ht 5\' 10"  (1.778 m)   Wt 180 lb (81.6 kg)   SpO2 96%   BMI 25.83 kg/m    Physical Exam Vitals and nursing note reviewed.  Constitutional:      General: He is not in acute  distress.    Appearance: Normal appearance. He is not ill-appearing.  HENT:     Head: Normocephalic and atraumatic.  Eyes:     Conjunctiva/sclera: Conjunctivae normal.  Cardiovascular:     Rate and Rhythm: Normal rate and regular rhythm.     Heart sounds: Normal heart sounds. No murmur heard. Pulmonary:     Effort: Pulmonary effort is normal. No respiratory distress.     Breath sounds: Normal breath sounds. No wheezing, rhonchi or rales.  Neurological:     Mental Status: He is alert.  Psychiatric:        Mood and Affect: Mood normal.        Behavior: Behavior normal.     UC Treatments / Results  Labs (all labs ordered are listed, but only abnormal results are displayed) Labs Reviewed  POCT URINALYSIS DIP (MANUAL ENTRY) - Abnormal; Notable for the following components:      Result Value   Blood, UA trace-intact (*)    All other components within normal limits    EKG   Radiology No results found.  Procedures Procedures (including critical care time)  Medications Ordered in UC Medications - No data to display  Initial Impression / Assessment and Plan / UC Course  I have reviewed the triage vital signs and the nursing notes.  Pertinent labs & imaging results that were available during my care of the patient were reviewed by me and considered in my medical decision making (see chart for details).  Given significant past medical history recommended patient report to ED for further evaluation.  I suspect he will need stat labs to rule out any issues with his thyroid versus other causes of elevated blood pressure.  Patient is agreeable to same.  Final Clinical Impressions(s) / UC Diagnoses   Final diagnoses:  Dizziness and giddiness  Essential hypertension   Discharge Instructions   None    ED Prescriptions   None    PDMP not reviewed this encounter.   Francene Finders, PA-C 06/12/21 1946

## 2021-06-12 NOTE — ED Provider Notes (Signed)
Emergency Medicine Provider Triage Evaluation Note  Jason Santana , a 87 y.o. male  was evaluated in triage.  Pt complains of hypertension and dizziness.  Patient reports that he woke up 0 500 this morning with dizziness.  Dizziness was constant until approximately 1200.  Denies any dizziness at present.  Patient went to urgent care and found that his blood pressure was elevated.  Patient reports he has history of hypertension but reports taking blood pressure medication as prescribed.  Review of Systems  Positive: Dizziness, hypertension Negative: Headache, visual disturbance, numbness, weakness, facial asymmetry, dysarthria, chest pain, shortness of breath  Physical Exam  BP (!) 162/66   Pulse 80   Temp 98 F (36.7 C) (Oral)   Resp 18   SpO2 99%  Gen:   Awake, no distress   Resp:  Normal effort  MSK:   Moves extremities without difficulty  Other:  Abdomen soft, nondistended, nontender, no mass or pulsatile mass.  Medical Decision Making  Medically screening exam initiated at 8:55 PM.  Appropriate orders placed.  [name] was informed that the remainder of the evaluation will be completed by another provider, this initial triage assessment does not replace that evaluation, and the importance of remaining in the ED until their evaluation is complete.  The patient appears stable so that the remainder of the work up may be completed by another provider.      Loni Beckwith, PA-C 06/12/21 2057    Valarie Merino, MD 06/12/21 [phone]

## 2021-06-12 NOTE — ED Triage Notes (Signed)
Pt states he had some dizziness this morning, denies dizziness at this time. Pt states he went to UC and had high BP and was told to come to ED. Pt ambulatory. BP 162/66 here in triage.

## 2021-06-13 ENCOUNTER — Telehealth: Payer: Self-pay | Admitting: Pharmacist

## 2021-06-13 ENCOUNTER — Other Ambulatory Visit: Payer: Self-pay | Admitting: Cardiology

## 2021-06-13 MED ORDER — DILTIAZEM HCL ER COATED BEADS 120 MG PO TB24: 120.0000 mg | ORAL_TABLET | Freq: Every day | ORAL | 3 refills | Status: DC

## 2021-06-13 NOTE — ED Notes (Signed)
Pt called x2 in ED lobby. Pt did not respond to name call and is currently not present.

## 2021-06-13 NOTE — Telephone Encounter (Addendum)
Patient called stating yesterday he woke up and felt dizzy. Checked his BP and it was high. Went to urgent care. BP was 179/65. They sent him to hospital. They drew blood, but they said it would be 8-10hr before MD would see so he left. This AM BP is 160/61. Reviewed medications with him. Patient has multiple intolerances. I have asked patient to monitor for another few days. Patient to call if BP doesn't improve.

## 2021-06-13 NOTE — ED Notes (Signed)
Pt called x3. Currently not present and did not respond to name call. Triage RN notified.

## 2021-06-13 NOTE — ED Notes (Signed)
Pt called in ED lobby for vitals reassessment- no answer x1. [employer]

## 2021-06-14 NOTE — Telephone Encounter (Signed)
Reviewed w/ pharmD Ok to fill alternative formulary requested

## 2021-06-15 ENCOUNTER — Encounter: Payer: Self-pay | Admitting: Nurse Practitioner

## 2021-07-05 ENCOUNTER — Ambulatory Visit (INDEPENDENT_AMBULATORY_CARE_PROVIDER_SITE_OTHER): Payer: Medicare Other | Admitting: Nurse Practitioner

## 2021-07-05 ENCOUNTER — Encounter: Payer: Self-pay | Admitting: Nurse Practitioner

## 2021-07-05 ENCOUNTER — Other Ambulatory Visit: Payer: Medicare Other

## 2021-07-05 VITALS — BP 140/58 | HR 71 | Ht 69.0 in | Wt 189.0 lb

## 2021-07-05 DIAGNOSIS — I25708 Atherosclerosis of coronary artery bypass graft(s), unspecified, with other forms of angina pectoris: Secondary | ICD-10-CM

## 2021-07-05 DIAGNOSIS — R932 Abnormal findings on diagnostic imaging of liver and biliary tract: Secondary | ICD-10-CM

## 2021-07-05 DIAGNOSIS — R609 Edema, unspecified: Secondary | ICD-10-CM

## 2021-07-05 NOTE — Progress Notes (Signed)
ASSESSMENT AND PLAN    # 87 yo male with recent incidental finding of nodular liver on chest CTA .  No evidence for decompensation if he does have cirrhosis .  --Mild thrombocytopenia but no other laboratory findings to suggest advanced liver disease.  His serum albumin is normal.  Bilirubin in normal. INR is not helpful in setting of warfarin.   Clinically he has no signs of advanced liver disease such as ascites, muscle wasting, palmar erythema --Possible ultrasound at PCPs office a few months ago, will request report.  If he did not have an [place] then will schedule him for one . Preferably a complete abdominal US to simultaneously evaluate for ascites and splenomegaly --Holding off on extensive work up for possible causes of cirrhosis since it is not yet clear that he has it.   It is reasonable to at least obtain an ANA and check for viral chronic hepatitis --If proves to have cirrhosis then he will need [place] screening.  Additionally, consider repeating EGD to evaluate for esophageal varices especially given need for chronic anticoagulation.  .  Of note there was no evidence for portal hypertension on EGD in 2019 .   --No obvious ascites on exam but he does have swelling in his legs.  He is on a low-dose of Aldactone, not taking Lasix on a regular basis due to the excessive urination it causes.  Encouraged him to take Lasix as prescribed.  Additionally we spoke briefly about hidden sources of sodium in diet  #History of GI bleed on warfarin /PUD  possible source after upper and lower endoscopic evaluation by [facility] in May 2019 --On chronic PPI    # Chronic , stable macrocytic anemia. Anemia monitored by Dr. Inda Merlin ( PCP). He is on iron.  No polyps / cancer on colonoscopy in May 2019. Rare blood with BM when straining  # Mechanical aortic valve replacement/A. fib/a flutter.  On chronic warfarin   HISTORY OF PRESENT ILLNESS     Chief Complaint : Evaluate for cirrhosis  Jason Santana is a  87 y.o. male with a past medical history significant for AFIB, atrial flutter, CAD , mechanical valve replacement chronically anticoagulated, hypothyroidism, GI bleed in 2019, PUD,  duodenal diverticulum, colonic diverticulosis. See PMH below for any additional medical issues.   Patient referred by PCP for evaluation of possible cirrhosis, do not have records from PCP .  I see a CTA chest from Sept 2022 which suggests cirrhosis based on view of upper abdomen. Patient describes having an abdominal US done at PCP's office a few months back but doesn't seem to think it showed cirrhosis. Seen in ED on 06/12/21 for dizziness. Labs unremarkable except for AST of 52. INR was elevated on warfarin .  His serum albumin was normal.  Platelets were normal but they have historically been low .  Per wife there is been no unusual periods of confusion at home . No history of viral hepatitis. He has never consumed Etoh. No [place] of liver disease. He has recently gained about 10 pounds and complains of swelling in his legs but not his abdomen. [name]  He takes a low-dose of Aldactone daily.  He is supposed to be on a low-dose of furosemide but not taking it on a regular basis since it causes him to urinate too frequently when he is away from home.  He does not add salt to his food but otherwise does not monitor sodium intake.  Data Reviewed:  Oct 2022 Cr 1.19, TSH 0.757, alk phos 75, Tbili 1.2, AST 52, ALT 25, albumin 4.2, WBC 6.2, hgb 11.8, MCV 104, platelets 218 ( his baseline in 120s to 140s)  CTA chest 05/30/21  IMPRESSION: 1. Stable top-normal/mildly enlarged ascending thoracic aorta measuring 3.9 cm by CTA today. 2. Stable chronic lung disease. 3. Nodular contour of liver likely reflecting underlying cirrhosis.  PREVIOUS GI EVALUATIONS:   May 2019 EGD for melena ( [facility]) -Z-line regular, 42 cm from the incisors. - Non-bleeding gastric ulcer with no stigmata of bleeding. Biopsied. - Erosive gastropathy. -  Nodular mucosa in the duodenal bulb. Biopsied. - Normal first portion of the duodenum and second portion of the duodenum. - Duodenal diverticulum.  May 2019 Colonoscopy for hematochezia ([facility]) --Hemorrhoids found on perianal exam. - Internal hemorrhoids. - Diverticulosis in the sigmoid colon and in the descending colon. - The exam was otherwise normal to the cecum. Hemorrhoids could cause some sporadic blood in stool. No source of anemia seen on colonoscopy; possibly chronic disease +/- ulcer? Diagnosis 1. Duodenum, Biopsy, abnormal / polypoid mucosa duodenal bulb - INFLAMMATION AND PYLORIC METAPLASIA CONSISTENT WITH PEPTIC INJURY. - NO FEATURES OF SPRUE OR GRANULOMAS. - NO ADENOMATOUS CHANGE OR MALIGNANCY. 2. Stomach, biopsy, random - ANTRAL MUCOSA WITH SLIGHT CHRONIC INFLAMMATION. [place] STAIN NEGATIVE FOR HELICOBACTER PYLORI. - NO INTESTINAL METAPLASIA, DYSPLASIA OR MALIGNANCY    Past Medical History:  Diagnosis Date   Anticoagulation adequate 05/23/2019   Atrial fibrillation (HCC)    CAD (coronary artery disease) of artery bypass graft 2000   2000 with multiple bypasses    Chronic anticoagulation    Coumadin therapy for mechanical aortic valve. Postoperative atrial fibrillation.    Essential hypertension    H/O mechanical aortic valve replacement 2000   St Jude AVR   Hypothyroidism    Premature ventricular contractions    S/P ablation of atrial fibrillation 05/22/19 05/23/2019   Typical atrial flutter ([place]) 05/23/2019     Past Surgical History:  Procedure Laterality Date   AORTIC VALVE REPLACEMENT (AVR)/CORONARY ARTERY BYPASS GRAFTING (CABG)  2000   ATRIAL FIBRILLATION ABLATION N/A 05/22/2019   Procedure: ATRIAL FIBRILLATION ABLATION;  Surgeon: Constance Haw, MD;  Location: [place] [facility];  Service: Cardiovascular;  Laterality: N/A;   CARDIOVERSION N/A 02/24/2018   Procedure: CARDIOVERSION;  Surgeon: Fay Records, MD;  Location: [facility];   Service: Cardiovascular;  Laterality: N/A;   CARDIOVERSION N/A 08/15/2018   Procedure: CARDIOVERSION;  Surgeon: Thayer Headings, MD;  Location: [place];  Service: Cardiovascular;  Laterality: N/A;   COLONOSCOPY WITH PROPOFOL N/A 01/08/2018   Procedure: COLONOSCOPY WITH PROPOFOL;  Surgeon: Arta Silence, MD;  Location: [facility];  Service: Gastroenterology;  Laterality: N/A;   CORONARY/GRAFT ANGIOGRAPHY N/A 08/14/2018   Procedure: CORONARY/GRAFT ANGIOGRAPHY;  Surgeon: [name], MD;  Location: [place] [facility];  Service: Cardiovascular;  Laterality: N/A;   ESOPHAGOGASTRODUODENOSCOPY (EGD) WITH PROPOFOL N/A 01/06/2018   Procedure: ESOPHAGOGASTRODUODENOSCOPY (EGD) WITH PROPOFOL;  Surgeon: Otis Brace, MD;  Location: [facility];  Service: Gastroenterology;  Laterality: N/A;   Family History  Problem Relation Age of Onset   Hypertension Mother    Heart attack Mother    Hypertension Father    Social History   Tobacco Use   Smoking status: Former    Types: Cigarettes   Smokeless tobacco: Never   Tobacco comments:    quit 57 years ago  Vaping Use   Vaping Use: Never used  Substance Use Topics   Alcohol use: No   Drug use: No   Current Outpatient Medications  Medication Sig Dispense Refill   Cholecalciferol (VITAMIN D) 2000 units tablet Take 2,000 Units by mouth daily.     diltiazem (CARDIZEM) 30 MG tablet TAKE 1 TABLET AS NEEDED FOR PALPITATIONS OR HEART RATE >100 30 tablet 6   diltiazem (TIADYLT ER) 120 MG 24 hr capsule Take 1 capsule (120 mg total) by mouth daily. 90 capsule 3   diphenhydramine-acetaminophen (TYLENOL PM) 25-500 MG TABS tablet Take 0.5 tablets by mouth at bedtime as needed (sleep).     ferrous sulfate 325 (65 FE) MG tablet Take 325 mg by mouth daily with supper.     furosemide (LASIX) 20 MG tablet Take 20 mg by mouth as needed.     levothyroxine (SYNTHROID) 125 MCG tablet Take 125 mcg by mouth daily before breakfast.      Multiple  Vitamins-Minerals (PRESERVISION AREDS 2 PO) Take 1 capsule by mouth 2 (two) times daily.     pantoprazole (PROTONIX) 40 MG tablet Take 40 mg by mouth 4 (four) times a week.     Polyethyl Glycol-Propyl Glycol (SYSTANE OP) Place 1 drop into both eyes daily as needed (dry eyes).     spironolactone (ALDACTONE) 25 MG tablet Take 1 tablet (25 mg total) by mouth daily. 90 tablet 3   tobramycin (TOBREX) 0.3 % ophthalmic solution Place 1 drop into the left eye See admin instructions. Instill 1 drop into left eye 1 day before, the day of, and the day after eye injections administered at Dr's office     triamcinolone cream (KENALOG) 0.1 % Apply 1 application topically 2 (two) times daily. 30 g 0   valsartan (DIOVAN) 320 MG tablet TAKE 1 TABLET BY MOUTH EVERY DAY 90 tablet 2   warfarin (COUMADIN) 7.5 MG tablet Take 5-7.5 mg by mouth See admin instructions. Per patient taking 26m on Sundays     No current facility-administered medications for this visit.   Allergies  Allergen Reactions   Carvedilol Shortness Of Breath    wheezing   Lopid [Gemfibrozil] Other (See Comments)    transaminitis   Monopril [Fosinopril] Other (See Comments)    transaminitis   Vicodin [Hydrocodone-Acetaminophen]     Severe sensitivity   Phenergan [Promethazine Hcl] Other (See Comments)    Severe somnolence.   Zocor [Simvastatin]     Short memory loss.   Benicar [Olmesartan]     Abdominal cramping and increased stools/ irritable bowel   Codeine Nausea And Vomiting   Prilosec [Omeprazole]     dyspepsia     Review of Systems: Positive for swelling of feet and legs, nosebleeds.  All other systems reviewed and negative except where noted in HPI.    PHYSICAL EXAM :    Wt Readings from Last 3 Encounters:  07/05/21 189 lb (85.7 kg)  06/12/21 180 lb (81.6 kg)  05/16/21 180 lb 6.4 oz (81.8 kg)    BP (!) 140/58   Pulse 71   Ht _0  (1.753 m)   Wt 189 lb (85.7 kg)   SpO2 98%   BMI 27.91 kg/m  Constitutional:   Generally well appearing male in no acute distress. Psychiatric: Pleasant. Normal mood and affect. Behavior is normal. EENT: Pupils normal.  Conjunctivae are normal. No scleral icterus. Neck supple.  Cardiovascular: Normal rate, regular rhythm, mechanical valve click,  2+ BLE edema Pulmonary/chest: Effort normal and breath sounds normal. No wheezing, rales or rhonchi. Abdominal: Soft,  nondistended, nontender. Bowel sounds active throughout. There are no masses palpable. No hepatomegaly. Neurological: Alert and oriented to person place and time. Skin: Skin is warm and dry. No rashes noted.  No palmar erythema  Tye Savoy, NP  07/05/2021, 2:43 PM  Cc:  Referring Provider [name], MD

## 2021-07-05 NOTE — Patient Instructions (Signed)
If you are age 85 or older, your body mass index should be between 23-30. Your Body mass index is 27.91 kg/m. If this is out of the aforementioned range listed, please consider follow up with your Primary Care Provider.  LABS:  Lab work has been ordered for you today. Our lab is located in the basement. Press "B" on the elevator. The lab is located at the first door on the left as you exit the elevator.  HEALTHCARE LAWS AND MY CHART RESULTS: Due to recent changes in healthcare laws, you may see the results of your imaging and laboratory studies on MyChart before your provider has had a chance to review them.   We understand that in some cases there may be results that are confusing or concerning to you. Not all laboratory results come back in the same time frame and the provider may be waiting for multiple results in order to interpret others.  Please give [place] 48 hours in order for your provider to thoroughly review all the results before contacting the office for clarification of your results.   We have scheduled you a follow up with Alonza Bogus, PA-C  on 08/02/21 at 3:00 pm.  It was great seeing you today! Thank you for entrusting me with your care and choosing [facility].  Tye Savoy, NP

## 2021-07-06 DIAGNOSIS — Z20828 Contact with and (suspected) exposure to other viral communicable diseases: Secondary | ICD-10-CM | POA: Diagnosis not present

## 2021-07-06 NOTE — Progress Notes (Signed)
I agree with the above note, plan 

## 2021-07-07 LAB — HEPATITIS B CORE ANTIBODY, TOTAL: Hep B Core Total Ab: NONREACTIVE

## 2021-07-07 LAB — HEPATITIS C ANTIBODY
Hepatitis C Ab: NONREACTIVE
SIGNAL TO CUT-OFF: 0.28 (ref <1.00)

## 2021-07-07 LAB — HEPATITIS A ANTIBODY, TOTAL: Hepatitis A AB,Total: NONREACTIVE

## 2021-07-07 LAB — HEPATITIS B SURFACE ANTIGEN: Hepatitis B Surface Ag: NONREACTIVE

## 2021-07-07 LAB — ANA: Anti Nuclear Antibody (ANA): NEGATIVE

## 2021-07-07 LAB — HEPATITIS B SURFACE ANTIBODY,QUALITATIVE: Hep B S Ab: NONREACTIVE

## 2021-07-10 NOTE — Progress Notes (Signed)
Cardiology Office Note:    Date:  07/10/2021   ID:  Jason Santana, DOB [birth year], MRN [id number]  PCP:  [name], MD  Cardiologist:  Sinclair Grooms, MD   Referring MD: [name], MD   No chief complaint on file.   History of Present Illness:    Jason Santana is a 87 y.o. male with a hx of mechanical aortic valve replacement (2000), paroxysmal atrial fibrillation and atrial flutter and ablation 2020, chronic anticoagulation therapy, coronary artery disease with remote CABG, lower GI bleed May 2019 required pausing of anticoagulation therapy.  Electrical cardioversion performed June 2019, August 15, 2018, and October 22, 2018 prior to  A. fib/flutter ablation September 2020 ([place])--> NSR.  [name] is doing well.  He has not had any substantial atrial fibrillation which causes significant symptoms since his ablation in September 2020 by Dr. Curt Bears.  He is compliant with his medical regimen.  He has had no significant bleeding on Coumadin therapy.  He monitors his blood pressure regularly.  Past Medical History:  Diagnosis Date   Anticoagulation adequate 05/23/2019   Atrial fibrillation ([place])    CAD (coronary artery disease) of artery bypass graft 2000   2000 with multiple bypasses    Chronic anticoagulation    Coumadin therapy for mechanical aortic valve. Postoperative atrial fibrillation.    Essential hypertension    H/O mechanical aortic valve replacement 2000   St Jude AVR   Hypothyroidism    Premature ventricular contractions    S/P ablation of atrial fibrillation 05/22/19 05/23/2019   Typical atrial flutter ([place]) 05/23/2019    Past Surgical History:  Procedure Laterality Date   AORTIC VALVE REPLACEMENT (AVR)/CORONARY ARTERY BYPASS GRAFTING (CABG)  2000   ATRIAL FIBRILLATION ABLATION N/A 05/22/2019   Procedure: ATRIAL FIBRILLATION ABLATION;  Surgeon: Constance Haw, MD;  Location: [facility];  Service: Cardiovascular;  Laterality: N/A;    CARDIOVERSION N/A 02/24/2018   Procedure: CARDIOVERSION;  Surgeon: Fay Records, MD;  Location: [facility] ENDOSCOPY;  Service: Cardiovascular;  Laterality: N/A;   CARDIOVERSION N/A 08/15/2018   Procedure: CARDIOVERSION;  Surgeon: Thayer Headings, MD;  Location: [place];  Service: Cardiovascular;  Laterality: N/A;   COLONOSCOPY WITH PROPOFOL N/A 01/08/2018   Procedure: COLONOSCOPY WITH PROPOFOL;  Surgeon: Arta Silence, MD;  Location: [facility];  Service: Gastroenterology;  Laterality: N/A;   CORONARY/GRAFT ANGIOGRAPHY N/A 08/14/2018   Procedure: CORONARY/GRAFT ANGIOGRAPHY;  Surgeon: [name], MD;  Location: [place] [facility];  Service: Cardiovascular;  Laterality: N/A;   ESOPHAGOGASTRODUODENOSCOPY (EGD) WITH PROPOFOL N/A 01/06/2018   Procedure: ESOPHAGOGASTRODUODENOSCOPY (EGD) WITH PROPOFOL;  Surgeon: Otis Brace, MD;  Location: [facility]OSCOPY;  Service: Gastroenterology;  Laterality: N/A;    Current Medications: No outpatient medications have been marked as taking for the 07/11/21 encounter (Appointment) with Belva Crome, MD.     Allergies:   Carvedilol, Lopid [gemfibrozil], Monopril [fosinopril], Vicodin [hydrocodone-acetaminophen], Phenergan [promethazine hcl], Zocor [simvastatin], Benicar [olmesartan], Codeine, and Prilosec [omeprazole]   Social History   Socioeconomic History   Marital status: Married    Spouse name: Not on file   Number of children: Not on file   Years of education: Not on file   Highest education level: Not on file  Occupational History   Not on file  Tobacco Use   Smoking status: Former    Types: Cigarettes   Smokeless tobacco: Never   Tobacco comments:    quit 57 years ago  Vaping Use   Vaping  Use: Never used  Substance and Sexual Activity   Alcohol use: No   Drug use: No   Sexual activity: Not on file  Other Topics Concern   Not on file  Social History Narrative   Not on file   Social Determinants of Health   Financial Resource  Strain: Not on file  Food Insecurity: Not on file  Transportation Needs: Not on file  Physical Activity: Not on file  Stress: Not on file  Social Connections: Not on file     Family History: The patient's family history includes Heart attack in his mother; Hypertension in his father and mother.  ROS:   Please see the history of present illness.    Developed a rash on an antibiotic for UTI.  Still having some difficulty with skin.  All other systems reviewed and are negative.  EKGs/Labs/Other Studies Reviewed:    The following studies were reviewed today: No new data  Chest CT scan 05/2021: IMPRESSION: 1. Stable top-normal/mildly enlarged ascending thoracic aorta measuring 3.9 cm by CTA today. 2. Stable chronic lung disease. 3. Nodular contour of liver likely reflecting underlying cirrhosis.   Aortic Atherosclerosis (ICD10-I70.0) and Emphysema (ICD10-J43.9).   EKG:  EKG performed 06/13/2021 reveals normal sinus rhythm.  Recent Labs: 06/12/2021: ALT 25; BUN 19; Creatinine, Ser 1.19; Hemoglobin 11.8; Magnesium 1.9; Platelets 218; Potassium 4.5; Sodium 134; TSH 0.757  Recent Lipid Panel    Component Value Date/Time   CHOL 130 04/15/2019 0905   TRIG 157 (H) 04/15/2019 0905   HDL 30 (L) 04/15/2019 0905   CHOLHDL 4.3 04/15/2019 0905   CHOLHDL 7.7 08/12/2018 0225   VLDL 48 (H) 08/12/2018 0225   LDLCALC 69 04/15/2019 0905    Physical Exam:    VS:  There were no vitals taken for this visit.    Wt Readings from Last 3 Encounters:  07/05/21 189 lb (85.7 kg)  06/12/21 180 lb (81.6 kg)  05/16/21 180 lb 6.4 oz (81.8 kg)     GEN: Healthy and appearing younger than stated age of 74.. No acute distress HEENT: Normal NECK: No JVD. LYMPHATICS: No lymphadenopathy CARDIAC: 1/6 systolic right upper sternal mechanical flow murmur. RRR crisp mechanical valve closure sounds.  N gallop, or edema. VASCULAR:  Normal Pulses. No bruits. RESPIRATORY:  Clear to auscultation without rales,  wheezing or rhonchi  ABDOMEN: Soft, non-tender, non-distended, No pulsatile mass, MUSCULOSKELETAL: No deformity  SKIN: Warm and dry NEUROLOGIC:  Alert and oriented x 3 PSYCHIATRIC:  Normal affect   ASSESSMENT:    1. Persistent atrial fibrillation ([place])   2. Aneurysm of ascending aorta without rupture   3. Essential hypertension   4. H/O mechanical aortic valve replacement   5. Coronary artery disease of bypass graft of native heart with stable angina pectoris ([place])   6. Secondary hypercoagulable state ([place])    PLAN:    In order of problems listed above:  Maintaining sinus rhythm.  Last EKG done June 12, 2021 revealed normal sinus rhythm although computer interpretation was atrial flutter with 3-1 AV block.  Heart rate on the EKG was 80 bpm.  Continue current therapy. Ascending aortic diameter 3.9 cm by CT scan in September.  Stable compared to prior. Blood pressures well controlled and is at target of 140/60 mmHg for an 87 year old. Mechanical valve function appears normal. He denies angina. Continue Coumadin and watch for bleeding.   Medication Adjustments/Labs and Tests Ordered: Current medicines are reviewed at length with the patient today.  Concerns regarding medicines  are outlined above.  No orders of the defined types were placed in this encounter.  No orders of the defined types were placed in this encounter.   There are no Patient Instructions on file for this visit.   Signed, Sinclair Grooms, MD  07/10/2021 5:42 PM    [facility]

## 2021-07-11 ENCOUNTER — Encounter: Payer: Self-pay | Admitting: Interventional Cardiology

## 2021-07-11 ENCOUNTER — Ambulatory Visit (INDEPENDENT_AMBULATORY_CARE_PROVIDER_SITE_OTHER): Payer: Medicare Other | Admitting: Interventional Cardiology

## 2021-07-11 ENCOUNTER — Other Ambulatory Visit: Payer: Self-pay

## 2021-07-11 VITALS — BP 142/56 | HR 87 | Ht 69.0 in | Wt 185.2 lb

## 2021-07-11 DIAGNOSIS — I25708 Atherosclerosis of coronary artery bypass graft(s), unspecified, with other forms of angina pectoris: Secondary | ICD-10-CM | POA: Diagnosis not present

## 2021-07-11 DIAGNOSIS — I7121 Aneurysm of the ascending aorta, without rupture: Secondary | ICD-10-CM | POA: Diagnosis not present

## 2021-07-11 DIAGNOSIS — Z7901 Long term (current) use of anticoagulants: Secondary | ICD-10-CM | POA: Diagnosis not present

## 2021-07-11 DIAGNOSIS — Z952 Presence of prosthetic heart valve: Secondary | ICD-10-CM

## 2021-07-11 DIAGNOSIS — I4819 Other persistent atrial fibrillation: Secondary | ICD-10-CM | POA: Diagnosis not present

## 2021-07-11 DIAGNOSIS — D6869 Other thrombophilia: Secondary | ICD-10-CM | POA: Diagnosis not present

## 2021-07-11 DIAGNOSIS — I10 Essential (primary) hypertension: Secondary | ICD-10-CM | POA: Diagnosis not present

## 2021-07-11 NOTE — Patient Instructions (Signed)

## 2021-07-13 ENCOUNTER — Other Ambulatory Visit: Payer: Self-pay

## 2021-07-13 ENCOUNTER — Telehealth: Payer: Self-pay

## 2021-07-13 DIAGNOSIS — R932 Abnormal findings on diagnostic imaging of liver and biliary tract: Secondary | ICD-10-CM

## 2021-07-13 DIAGNOSIS — D696 Thrombocytopenia, unspecified: Secondary | ICD-10-CM

## 2021-07-13 NOTE — Telephone Encounter (Signed)
-----   Message from Willia Craze, NP sent at 07/13/2021 11:54 AM EST ----- Jason Santana,  I was waiting on ultrasound report from the patient's PCP. I saw him to evaluate for possible cirrhosis. Turns out the ultrasound was a renal ultrasound so not helpful for [place]. Will you please arrange for an abdominal ultrasound to evaluation for cirrhosis and also splenomegatly. Dx is abnormal liver on chest CT scan ( nodular liver ) and thrombocytopenia. Patient is supposed to come back to see me in follow up but no need to come back until I he has had this done ( and results reviewed) Thanks

## 2021-07-13 NOTE — Telephone Encounter (Signed)
Patient's spouse notified. Presently both she and Jason Santana have COVID. They will wait to be contacted by radiology for scheduling.

## 2021-07-24 ENCOUNTER — Other Ambulatory Visit: Payer: Self-pay

## 2021-07-24 ENCOUNTER — Ambulatory Visit (HOSPITAL_COMMUNITY)
Admission: RE | Admit: 2021-07-24 | Discharge: 2021-07-24 | Disposition: A | Discharge status: Home / Self Care | Payer: Medicare Other | Source: Ambulatory Visit | Attending: Nurse Practitioner | Admitting: Nurse Practitioner

## 2021-07-24 DIAGNOSIS — R932 Abnormal findings on diagnostic imaging of liver and biliary tract: Secondary | ICD-10-CM | POA: Diagnosis not present

## 2021-07-24 DIAGNOSIS — D696 Thrombocytopenia, unspecified: Secondary | ICD-10-CM | POA: Insufficient documentation

## 2021-07-24 DIAGNOSIS — K7460 Unspecified cirrhosis of liver: Secondary | ICD-10-CM | POA: Diagnosis not present

## 2021-07-24 DIAGNOSIS — N281 Cyst of kidney, acquired: Secondary | ICD-10-CM | POA: Diagnosis not present

## 2021-07-24 DIAGNOSIS — K7689 Other specified diseases of liver: Secondary | ICD-10-CM | POA: Diagnosis not present

## 2021-08-02 ENCOUNTER — Ambulatory Visit: Payer: Medicare Other | Admitting: Gastroenterology

## 2021-08-04 ENCOUNTER — Ambulatory Visit (INDEPENDENT_AMBULATORY_CARE_PROVIDER_SITE_OTHER): Payer: Medicare Other | Admitting: Nurse Practitioner

## 2021-08-04 ENCOUNTER — Encounter: Payer: Self-pay | Admitting: Nurse Practitioner

## 2021-08-04 ENCOUNTER — Other Ambulatory Visit: Payer: Medicare Other

## 2021-08-04 VITALS — BP 160/68 | HR 68 | Ht 69.0 in | Wt 184.0 lb

## 2021-08-04 DIAGNOSIS — I25708 Atherosclerosis of coronary artery bypass graft(s), unspecified, with other forms of angina pectoris: Secondary | ICD-10-CM

## 2021-08-04 DIAGNOSIS — K7460 Unspecified cirrhosis of liver: Secondary | ICD-10-CM

## 2021-08-04 DIAGNOSIS — I8510 Secondary esophageal varices without bleeding: Secondary | ICD-10-CM | POA: Diagnosis not present

## 2021-08-04 DIAGNOSIS — Z7901 Long term (current) use of anticoagulants: Secondary | ICD-10-CM

## 2021-08-04 NOTE — Progress Notes (Signed)
ASSESSMENT AND PLAN    #Newly diagnosed cirrhosis (by imaging) without evidence for portal hypertension.  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 Etiology of cirrhosis unclear at this point.  He has never consumed alcohol, no family history of liver disease.  No personal history of obesity. --We will obtain hepatic serologic work-up to evaluate for etiologies of cirrhosis.  Thus far, ANA and chronic viral hepatitis studies negative --He is clearly doing well but we talked about some things to look for such as dark urine, yellowing of the eyes, swelling of his abdomen.  Spoke to the wife about watching for any behavioral changes, sleeping pattern changes --Varices screening: Patient needs an EGD for varices screening especially since he is on chronic Coumadin. The risks and benefits of EGD with possible biopsies were discussed with the patient who agrees to proceed.  --Adrian screening.  No focal liver lesions on ultrasound earlier this month.  Will obtain AFP.  --He is not immune to hepatitis A nor hepatitis B, I have recommended vaccination.  --I advised him to make his other healthcare providers aware of this new diagnosis of cirrhosis as some medications may require dose adjustments  --Follow-up with myself or Dr. Ardis Hughs after EGD for ongoing management of newly diagnosed cirrhosis   # Mechanical valve replacement, on chronic Coumadin --Hold Coumadinx for 5 days before procedure - will instruct when and how to resume after procedure. Patient understands that there is a low but real risk of cardiovascular event such as heart attack, stroke, or embolism /  thrombosis, or ischemia while off Coumadin. The patient consents to proceed. Will communicate by phone or EMR with patient's prescribing provider to confirm that holding Coumadin is reasonable in this case.  --Patient is followed by Dr. Tamala Julian.  It is possible he may require Lovenox bridge but will see what Cardiologist has to say.      HISTORY OF PRESENT ILLNESS    Chief Complaint : Cirrhosis follow-up  Jason Santana is a 87 y.o. male with a past medical history of AFIB, atrial flutter, CAD , mechanical valve replacement chronically anticoagulated, hypothyroidism, GI bleed in 2019, PUD,  duodenal diverticulum, colonic diverticulosis. See PMH below for any additional medical issue.   Patient  established care the beginning of November after being referred for findings of possible cirrhosis on imaging.  Patient had undergone a CT angio of the chest which incidentally noticed nodularity of the top part of his liver.   He had mild thrombocytopenia but no other laboratory findings to suggest cirrhosis.  No stigmata of chronic liver disease on exam.  I obtained a few basic labs including an ANA and labs for chronic viral hepatitis.  I arranged for an abdominal ultrasound.  Patient is here with his wife for follow-up  INTERVAL HISTORY ANA, viral hepatitis studies. Abdominal ultrasound did show a cirrhotic appearing liver.  No evidence for hepatoma.  His spleen appeared normal.  Patient has no complaints.  He has had no confusion.  No dark urine urine.  No yellowing of his eyes.  Patient has never consumed alcohol.  No family history of liver disease.  No personal history of obesity.  He has had some minor swelling in his RLE but that is possibly due to remote surgery/hardware in his leg   Current Medications, Allergies, Past Medical History, Past Surgical History, Family History and Social History were reviewed in [employer] record.     Current Outpatient Medications  Medication  Sig Dispense Refill   Cholecalciferol (VITAMIN D) 2000 units tablet Take 2,000 Units by mouth daily.     clobetasol ointment (TEMOVATE) 2.24 % 1 application to affected area     diltiazem (CARDIZEM) 30 MG tablet TAKE 1 TABLET AS NEEDED FOR PALPITATIONS OR HEART RATE >100 30 tablet 6   diltiazem (TIADYLT ER) 120 MG 24 hr capsule  Take 1 capsule (120 mg total) by mouth daily. 90 capsule 3   diphenhydramine-acetaminophen (TYLENOL PM) 25-500 MG TABS tablet Take 0.5 tablets by mouth at bedtime as needed (sleep).     ferrous sulfate 325 (65 FE) MG tablet Take 325 mg by mouth daily with supper.     furosemide (LASIX) 20 MG tablet Take 20 mg by mouth as needed.     levothyroxine (SYNTHROID) 125 MCG tablet Take 125 mcg by mouth daily before breakfast.      Multiple Vitamins-Minerals (PRESERVISION AREDS 2 PO) Take 1 capsule by mouth 2 (two) times daily.     pantoprazole (PROTONIX) 40 MG tablet Take 40 mg by mouth 4 (four) times a week.     Polyethyl Glycol-Propyl Glycol (SYSTANE OP) Place 1 drop into both eyes daily as needed (dry eyes).     spironolactone (ALDACTONE) 25 MG tablet Take 1 tablet (25 mg total) by mouth daily. 90 tablet 3   tamsulosin (FLOMAX) 0.4 MG CAPS capsule Take 0.4 mg by mouth daily.     tobramycin (TOBREX) 0.3 % ophthalmic solution Place 1 drop into the left eye See admin instructions. Instill 1 drop into left eye 1 day before, the day of, and the day after eye injections administered at Dr's office     triamcinolone cream (KENALOG) 0.1 % Apply 1 application topically 2 (two) times daily. 30 g 0   valsartan (DIOVAN) 320 MG tablet TAKE 1 TABLET BY MOUTH EVERY DAY 90 tablet 2   warfarin (COUMADIN) 7.5 MG tablet Take 5-7.5 mg by mouth See admin instructions. Per patient taking 5mg  on Sundays     No current facility-administered medications for this visit.    Review of Systems: No chest pain. No shortness of breath. No urinary complaints.   PHYSICAL EXAM :    Wt Readings from Last 3 Encounters:  08/04/21 184 lb (83.5 kg)  07/11/21 185 lb 3.2 oz (84 kg)  07/05/21 189 lb (85.7 kg)    BP (!) 160/68   Pulse 68   Ht 5\' 9"  (1.753 m)   Wt 184 lb (83.5 kg)   BMI 27.17 kg/m  Constitutional:  Generally well appearing male in no acute distress. Psychiatric: Pleasant. Normal mood and affect. Behavior is  normal. EENT: Pupils normal.  Conjunctivae are normal. No scleral icterus. Neck supple.  Cardiovascular: Normal rate, regular rhythm, 1+  RLE edema Pulmonary/chest: Effort normal and breath sounds normal. No wheezing, rales or rhonchi. Abdominal: Soft, nondistended, nontender. Bowel sounds active throughout. There are no masses palpable. No hepatomegaly. Neurological: Alert and oriented to person place and time. Skin: Skin is warm and dry. No rashes noted.  Tye Savoy, NP  08/04/2021, 5:02 PM  Cc:  [name], MD

## 2021-08-04 NOTE — Patient Instructions (Addendum)
If you are age 84 or older, your body mass index should be between 23-30. Your Body mass index is 27.17 kg/m. If this is out of the aforementioned range listed, please consider follow up with your Primary Care Provider.   The [facility] providers would like to encourage you to use [facility] to communicate with providers for non-urgent requests or questions.  Due to long hold times on the telephone, sending your provider a message by [facility] may be faster and more efficient way to get a response. Please allow 48 business hours for a response.  Please remember that this is for non-urgent requests/questions.  PROCEDURES: You have been scheduled for a EGD. Please follow the written instructions given to you at your visit today. If you use inhalers (even only as needed), please bring them with you on the day of your procedure.  LABS:  Lab work has been ordered for you today. Our lab is located in the basement. Press "B" on the elevator. The lab is located at the first door on the left as you exit the elevator.  HEALTHCARE LAWS AND MY CHART RESULTS: Due to recent changes in healthcare laws, you may see the results of your imaging and laboratory studies on MyChart before your provider has had a chance to review them.   We understand that in some cases there may be results that are confusing or concerning to you. Not all laboratory results come back in the same time frame and the provider may be waiting for multiple results in order to interpret others.  Please give [place] 48 hours in order for your provider to thoroughly review all the results before contacting the office for clarification of your results.    We recommend going by your pharmacy to get a Hepatitis A and B vaccine. Check with them regarding if it is covered by your insurance.  It was great seeing you today! Thank you for entrusting me with your care and choosing [facility].  Tye Savoy, NP

## 2021-08-04 NOTE — H&P (View-Only) (Signed)
ASSESSMENT AND PLAN    #Newly diagnosed cirrhosis (by imaging) without evidence for portal hypertension.  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 Etiology of cirrhosis unclear at this point.  He has never consumed alcohol, no family history of liver disease.  No personal history of obesity. --We will obtain hepatic serologic work-up to evaluate for etiologies of cirrhosis.  Thus far, ANA and chronic viral hepatitis studies negative --He is clearly doing well but we talked about some things to look for such as dark urine, yellowing of the eyes, swelling of his abdomen.  Spoke to the wife about watching for any behavioral changes, sleeping pattern changes --Varices screening: Patient needs an EGD for varices screening especially since he is on chronic Coumadin. The risks and benefits of EGD with possible biopsies were discussed with the patient who agrees to proceed.  --Lasana screening.  No focal liver lesions on ultrasound earlier this month.  Will obtain AFP.  --He is not immune to hepatitis A nor hepatitis B, I have recommended vaccination.  --I advised him to make his other healthcare providers aware of this new diagnosis of cirrhosis as some medications may require dose adjustments  --Follow-up with myself or Dr. Ardis Hughs after EGD for ongoing management of newly diagnosed cirrhosis   # Mechanical valve replacement, on chronic Coumadin --Hold Coumadinx for 5 days before procedure - will instruct when and how to resume after procedure. Patient understands that there is a low but real risk of cardiovascular event such as heart attack, stroke, or embolism /  thrombosis, or ischemia while off Coumadin. The patient consents to proceed. Will communicate by phone or EMR with patient's prescribing provider to confirm that holding Coumadin is reasonable in this case.  --Patient is followed by Dr. Tamala Julian.  It is possible he may require Lovenox bridge but will see what Cardiologist has to say.      HISTORY OF PRESENT ILLNESS    Chief Complaint : Cirrhosis follow-up  Jason Santana is a 87 y.o. male with a past medical history of AFIB, atrial flutter, CAD , mechanical valve replacement chronically anticoagulated, hypothyroidism, GI bleed in 2019, PUD,  duodenal diverticulum, colonic diverticulosis. See PMH below for any additional medical issue.   Patient  established care the beginning of November after being referred for findings of possible cirrhosis on imaging.  Patient had undergone a CT angio of the chest which incidentally noticed nodularity of the top part of his liver.   He had mild thrombocytopenia but no other laboratory findings to suggest cirrhosis.  No stigmata of chronic liver disease on exam.  I obtained a few basic labs including an ANA and labs for chronic viral hepatitis.  I arranged for an abdominal ultrasound.  Patient is here with his wife for follow-up  INTERVAL HISTORY ANA, viral hepatitis studies. Abdominal ultrasound did show a cirrhotic appearing liver.  No evidence for hepatoma.  His spleen appeared normal.  Patient has no complaints.  He has had no confusion.  No dark urine urine.  No yellowing of his eyes.  Patient has never consumed alcohol.  No family history of liver disease.  No personal history of obesity.  He has had some minor swelling in his RLE but that is possibly due to remote surgery/hardware in his leg   Current Medications, Allergies, Past Medical History, Past Surgical History, Family History and Social History were reviewed in [employer] record.     Current Outpatient Medications  Medication  Sig Dispense Refill   Cholecalciferol (VITAMIN D) 2000 units tablet Take 2,000 Units by mouth daily.     clobetasol ointment (TEMOVATE) 1.61 % 1 application to affected area     diltiazem (CARDIZEM) 30 MG tablet TAKE 1 TABLET AS NEEDED FOR PALPITATIONS OR HEART RATE >100 30 tablet 6   diltiazem (TIADYLT ER) 120 MG 24 hr capsule  Take 1 capsule (120 mg total) by mouth daily. 90 capsule 3   diphenhydramine-acetaminophen (TYLENOL PM) 25-500 MG TABS tablet Take 0.5 tablets by mouth at bedtime as needed (sleep).     ferrous sulfate 325 (65 FE) MG tablet Take 325 mg by mouth daily with supper.     furosemide (LASIX) 20 MG tablet Take 20 mg by mouth as needed.     levothyroxine (SYNTHROID) 125 MCG tablet Take 125 mcg by mouth daily before breakfast.      Multiple Vitamins-Minerals (PRESERVISION AREDS 2 PO) Take 1 capsule by mouth 2 (two) times daily.     pantoprazole (PROTONIX) 40 MG tablet Take 40 mg by mouth 4 (four) times a week.     Polyethyl Glycol-Propyl Glycol (SYSTANE OP) Place 1 drop into both eyes daily as needed (dry eyes).     spironolactone (ALDACTONE) 25 MG tablet Take 1 tablet (25 mg total) by mouth daily. 90 tablet 3   tamsulosin (FLOMAX) 0.4 MG CAPS capsule Take 0.4 mg by mouth daily.     tobramycin (TOBREX) 0.3 % ophthalmic solution Place 1 drop into the left eye See admin instructions. Instill 1 drop into left eye 1 day before, the day of, and the day after eye injections administered at Dr's office     triamcinolone cream (KENALOG) 0.1 % Apply 1 application topically 2 (two) times daily. 30 g 0   valsartan (DIOVAN) 320 MG tablet TAKE 1 TABLET BY MOUTH EVERY DAY 90 tablet 2   warfarin (COUMADIN) 7.5 MG tablet Take 5-7.5 mg by mouth See admin instructions. Per patient taking 5mg  on Sundays     No current facility-administered medications for this visit.    Review of Systems: No chest pain. No shortness of breath. No urinary complaints.   PHYSICAL EXAM :    Wt Readings from Last 3 Encounters:  08/04/21 184 lb (83.5 kg)  07/11/21 185 lb 3.2 oz (84 kg)  07/05/21 189 lb (85.7 kg)    BP (!) 160/68    Pulse 68    Ht 5\' 9"  (1.753 m)    Wt 184 lb (83.5 kg)    BMI 27.17 kg/m  Constitutional:  Generally well appearing male in no acute distress. Psychiatric: Pleasant. Normal mood and affect. Behavior is  normal. EENT: Pupils normal.  Conjunctivae are normal. No scleral icterus. Neck supple.  Cardiovascular: Normal rate, regular rhythm, 1+  RLE edema Pulmonary/chest: Effort normal and breath sounds normal. No wheezing, rales or rhonchi. Abdominal: Soft, nondistended, nontender. Bowel sounds active throughout. There are no masses palpable. No hepatomegaly. Neurological: Alert and oriented to person place and time. Skin: Skin is warm and dry. No rashes noted.  Tye Savoy, NP  08/04/2021, 5:02 PM  Cc:  [name], MD

## 2021-08-05 NOTE — Progress Notes (Signed)
Jason Santana,  Thanks for helping with him. He's 89 and at elevated risk for procedures and coming off his blood thinner.  EGD 2019 Dr. Rosalie Gums images reviewed (several very good pics), he had no esophageal varices or portal HTN.  He did however had a gastric ulcer back on that exam and so probably should have had a repeat EGD within a few months to check for ulcer healing.   Patty, Can you contact [facility], see if they have any records or another EGD being done in 2019. As you know their outpatient procedures notes, office notes are unfortunately not visible to the epic community and so we really cannot tell what was done for him very easily.  In the meantime please cancel his [facility] EGD scheduled for mid January. If he must have a repeat EGD I prefer it be done in hospital given his age, comorbid conditions.    Dr. Inda Merlin, [facility] sort all this out and keep you informed.    Thanks ALL

## 2021-08-07 ENCOUNTER — Telehealth: Payer: Self-pay

## 2021-08-07 NOTE — Telephone Encounter (Signed)
-----   Message from Milus Banister, MD sent at 08/05/2021  7:18 AM EST -----    ----- Message ----- From: Willia Craze, NP Sent: 08/04/2021   5:16 PM EST To: Milus Banister, MD

## 2021-08-07 NOTE — Telephone Encounter (Signed)
Author: Milus Banister, MD Service: Gastroenterology Author Type: Physician  Filed: 08/05/2021  7:29 AM Encounter Date: 08/04/2021 Status: Signed  Editor: Milus Banister, MD (Physician)        Nevin Bloodgood,  Thanks for helping with him. He's 89 and at elevated risk for procedures and coming off his blood thinner.  EGD 2019 Dr. Rosalie Gums images reviewed (several very good pics), he had no esophageal varices or portal HTN.  He did however had a gastric ulcer back on that exam and so probably should have had a repeat EGD within a few months to check for ulcer healing.    Namira Rosekrans, Can you contact [facility], see if they have any records or another EGD being done in 2019. As you know their outpatient procedures notes, office notes are unfortunately not visible to the epic community and so we really cannot tell what was done for him very easily.   In the meantime please cancel his [facility] EGD scheduled for mid January. If he must have a repeat EGD I prefer it be done in hospital given his age, comorbid conditions.     Dr. Inda Merlin, [facility] sort all this out and keep you informed.     Thanks ALL

## 2021-08-08 DIAGNOSIS — Z7901 Long term (current) use of anticoagulants: Secondary | ICD-10-CM | POA: Diagnosis not present

## 2021-08-08 NOTE — Telephone Encounter (Signed)
I spoke with [facility] and confirmed that colon EGD was completed in 2019.  I am not able to get records faxed until the pt signs a release.  I have left a message for the pt to call to discuss.

## 2021-08-08 NOTE — Telephone Encounter (Signed)
Procedure was cancelled the pt has been advised and will await a call if he needs to have repeat EGD in the hospital  [facility] [address] [place], [place] [address]  Call us Call: [phone]  Fax: [phone]

## 2021-08-08 NOTE — Telephone Encounter (Signed)
Patient returned your phone call.  Please call back.  Thank you. 

## 2021-08-08 NOTE — Telephone Encounter (Signed)
I spoke with the pt and he has signed a release with [facility] and the records will be faxed as soon as possible to Dr Ardis Hughs attention.  FYI Dr Ardis Hughs

## 2021-08-09 DIAGNOSIS — H353221 Exudative age-related macular degeneration, left eye, with active choroidal neovascularization: Secondary | ICD-10-CM | POA: Diagnosis not present

## 2021-08-09 DIAGNOSIS — H35372 Puckering of macula, left eye: Secondary | ICD-10-CM | POA: Diagnosis not present

## 2021-08-09 DIAGNOSIS — H43813 Vitreous degeneration, bilateral: Secondary | ICD-10-CM | POA: Diagnosis not present

## 2021-08-09 DIAGNOSIS — H353112 Nonexudative age-related macular degeneration, right eye, intermediate dry stage: Secondary | ICD-10-CM | POA: Diagnosis not present

## 2021-08-09 LAB — IGA: Immunoglobulin A: 365 mg/dL — ABNORMAL HIGH (ref 70–320)

## 2021-08-09 LAB — ALPHA-1-ANTITRYPSIN: A-1 Antitrypsin, Ser: 135 mg/dL (ref 83–199)

## 2021-08-09 LAB — AFP TUMOR MARKER: AFP-Tumor Marker: 5 ng/mL (ref <6.1)

## 2021-08-09 LAB — TISSUE TRANSGLUTAMINASE ABS,IGG,IGA
(tTG) Ab, IgA: <1 U/mL
(tTG) Ab, IgG: 2.3 U/mL

## 2021-08-09 LAB — MITOCHONDRIAL ANTIBODIES: Mitochondrial M2 Ab, IgG: 37.3 U — ABNORMAL HIGH (ref <=20.0)

## 2021-08-09 LAB — ANTI-SMOOTH MUSCLE ANTIBODY, IGG: Actin (Smooth Muscle) Antibody (IGG): <20 U (ref <20)

## 2021-08-15 ENCOUNTER — Telehealth: Payer: Self-pay

## 2021-08-15 ENCOUNTER — Other Ambulatory Visit: Payer: Self-pay

## 2021-08-15 DIAGNOSIS — R899 Unspecified abnormal finding in specimens from other organs, systems and tissues: Secondary | ICD-10-CM

## 2021-08-15 DIAGNOSIS — R932 Abnormal findings on diagnostic imaging of liver and biliary tract: Secondary | ICD-10-CM

## 2021-08-15 DIAGNOSIS — K7460 Unspecified cirrhosis of liver: Secondary | ICD-10-CM

## 2021-08-15 DIAGNOSIS — I8500 Esophageal varices without bleeding: Secondary | ICD-10-CM

## 2021-08-15 NOTE — Telephone Encounter (Signed)
Patient's coumadin is being managed by his PCP's coumadin clinic. Please reach out to Dr. [name]' office

## 2021-08-15 NOTE — Telephone Encounter (Signed)
Letter has been sent to Dr Inda Merlin.

## 2021-08-15 NOTE — Telephone Encounter (Signed)
[facility] Pre-operative Risk Assessment     Request for surgical clearance:     Endoscopy Procedure  What type of surgery is being performed?     EGD  When is this surgery scheduled?     12/22  What type of clearance is required ?   Pharmacy  Are there any medications that need to be held prior to surgery and how long? Coumadin  Practice name and name of physician performing surgery?      [facility]  What is your office phone and fax number?      Phone- [phone]  [phone]  Anesthesia type (None, local, MAC, general) ?       MAC

## 2021-08-16 ENCOUNTER — Other Ambulatory Visit: Payer: Medicare Other

## 2021-08-16 DIAGNOSIS — R899 Unspecified abnormal finding in specimens from other organs, systems and tissues: Secondary | ICD-10-CM

## 2021-08-16 DIAGNOSIS — R932 Abnormal findings on diagnostic imaging of liver and biliary tract: Secondary | ICD-10-CM

## 2021-08-16 DIAGNOSIS — K7460 Unspecified cirrhosis of liver: Secondary | ICD-10-CM

## 2021-08-17 ENCOUNTER — Telehealth: Payer: Self-pay

## 2021-08-17 NOTE — Telephone Encounter (Signed)
Left a message with Dr Inda Merlin office in response to coumadin hold.  Will need a response by tomorrow. Will call again in the morning if I do not get a call back today.

## 2021-08-17 NOTE — Telephone Encounter (Signed)
-----   Message from Timothy Lasso, RN sent at 08/17/2021  8:36 AM EST -----  ----- Message ----- From: Timothy Lasso, RN Sent: 08/17/2021  12:00 AM EST To: Timothy Lasso, RN  Make sure to have ok to hold coumadin for 12/22 EGD

## 2021-08-18 NOTE — Telephone Encounter (Signed)
Placed a call to the pt to make him aware he should hold his coumadin from 12/17.  His EGD is on 12/22 at [facility].  His voice mail has not been set up, tried the alternate number which is the same as the wifes number and no voice mail.  I placed a call to the daughter who is on the [facility] and left a detailed message to hold coumadin and appt information for EGD on 12/22.  All information was also mailed to the pt last week.  I will try again to reach pt this afternoon.

## 2021-08-18 NOTE — Telephone Encounter (Signed)
The pt returned call and has been instructed of the 12/22 EGD appt and made aware of the coumadin hold.  He is aware that Dr Inda Merlin office will call to discuss lovenox.  He will call if he has any further questions or concerns

## 2021-08-18 NOTE — Telephone Encounter (Signed)
Tried again to reach the pt by all numbers listed with out success.  Will try again

## 2021-08-18 NOTE — Telephone Encounter (Signed)
Spoke with [facility] regarding clearance on Coumadin hold. RN stated that per MD's orders it is okay to stop coumadin 4 days prior, and to resume the day after the procedure. Nurse stated that she would fax over the coumadin instructions from Dr. Inda Merlin today, and that she would contact the patient regarding Lovenox orders.

## 2021-08-20 LAB — BILE ACIDS, TOTAL: Bile Acids Total: 37 umol/L — ABNORMAL HIGH (ref 0–19)

## 2021-08-23 NOTE — Anesthesia Preprocedure Evaluation (Addendum)
Anesthesia Evaluation  Patient identified by MRN, date of birth, ID band Patient awake    Reviewed: Allergy & Precautions, NPO status , Patient's Chart, lab work & pertinent test results  History of Anesthesia Complications Negative for: history of anesthetic complications  Airway Mallampati: III  TM Distance: >3 FB Neck ROM: Full  Mouth opening: Limited Mouth Opening  Dental  (+) Partial Lower, Partial Upper   Pulmonary neg pulmonary ROS, former smoker,    Pulmonary exam normal        Cardiovascular hypertension, + CAD and + CABG  Normal cardiovascular exam+ dysrhythmias Atrial Fibrillation + Valvular Problems/Murmurs (s/p AVR)    Echo 2019: EF 55-60%, no RWMA, mechanical AV functioning normally, mild MR, mild-moderately reduced RVSF  Cath 2019:  1.  Severe two-vessel coronary artery disease with total occlusion of the OM 2 and the mid RCA, nonobstructive LAD stenosis 2.  Status post remote aortocoronary bypass surgery with continued patency of the LIMA to LAD, saphenous vein graft OM 2, and saphenous vein graft to PDA   Neuro/Psych negative neurological ROS     GI/Hepatic negative GI ROS, (+) Cirrhosis       ,   Endo/Other  Hypothyroidism   Renal/GU negative Renal ROS  negative genitourinary   Musculoskeletal negative musculoskeletal ROS (+)   Abdominal   Peds  Hematology negative hematology ROS (+)   Anesthesia Other Findings   Reproductive/Obstetrics                            Anesthesia Physical Anesthesia Plan  ASA: 3  Anesthesia Plan: MAC   Post-op Pain Management: Minimal or no pain anticipated   Induction: Intravenous  PONV Risk Score and Plan: 1 and Propofol infusion, TIVA and Treatment may vary due to age or medical condition  Airway Management Planned: Natural Airway, Nasal Cannula and Simple Face Mask  Additional Equipment: None  Intra-op Plan:    Post-operative Plan:   Informed Consent: I have reviewed the patients History and Physical, chart, labs and discussed the procedure including the risks, benefits and alternatives for the proposed anesthesia with the patient or authorized representative who has indicated his/her understanding and acceptance.       Plan Discussed with:   Anesthesia Plan Comments:        Anesthesia Quick Evaluation

## 2021-08-24 ENCOUNTER — Encounter (HOSPITAL_COMMUNITY)
Admission: RE | Disposition: A | Discharge status: Home / Self Care | Payer: Self-pay | Source: Home / Self Care | Attending: Gastroenterology

## 2021-08-24 ENCOUNTER — Ambulatory Visit (HOSPITAL_COMMUNITY): Payer: Medicare Other | Admitting: Anesthesiology

## 2021-08-24 ENCOUNTER — Encounter (HOSPITAL_COMMUNITY): Payer: Self-pay | Admitting: Gastroenterology

## 2021-08-24 ENCOUNTER — Ambulatory Visit (HOSPITAL_COMMUNITY)
Admission: RE | Admit: 2021-08-24 | Discharge: 2021-08-24 | Disposition: A | Discharge status: Home / Self Care | Payer: Medicare Other | Attending: Gastroenterology | Admitting: Gastroenterology

## 2021-08-24 ENCOUNTER — Other Ambulatory Visit: Payer: Self-pay

## 2021-08-24 DIAGNOSIS — I2510 Atherosclerotic heart disease of native coronary artery without angina pectoris: Secondary | ICD-10-CM | POA: Insufficient documentation

## 2021-08-24 DIAGNOSIS — Z952 Presence of prosthetic heart valve: Secondary | ICD-10-CM | POA: Diagnosis not present

## 2021-08-24 DIAGNOSIS — K2970 Gastritis, unspecified, without bleeding: Secondary | ICD-10-CM | POA: Insufficient documentation

## 2021-08-24 DIAGNOSIS — K259 Gastric ulcer, unspecified as acute or chronic, without hemorrhage or perforation: Secondary | ICD-10-CM | POA: Insufficient documentation

## 2021-08-24 DIAGNOSIS — K7460 Unspecified cirrhosis of liver: Secondary | ICD-10-CM | POA: Insufficient documentation

## 2021-08-24 DIAGNOSIS — I864 Gastric varices: Secondary | ICD-10-CM | POA: Diagnosis not present

## 2021-08-24 DIAGNOSIS — I4891 Unspecified atrial fibrillation: Secondary | ICD-10-CM | POA: Diagnosis not present

## 2021-08-24 DIAGNOSIS — Z8711 Personal history of peptic ulcer disease: Secondary | ICD-10-CM | POA: Insufficient documentation

## 2021-08-24 DIAGNOSIS — Z87891 Personal history of nicotine dependence: Secondary | ICD-10-CM | POA: Insufficient documentation

## 2021-08-24 DIAGNOSIS — I10 Essential (primary) hypertension: Secondary | ICD-10-CM | POA: Diagnosis not present

## 2021-08-24 DIAGNOSIS — Z951 Presence of aortocoronary bypass graft: Secondary | ICD-10-CM | POA: Diagnosis not present

## 2021-08-24 DIAGNOSIS — E039 Hypothyroidism, unspecified: Secondary | ICD-10-CM | POA: Insufficient documentation

## 2021-08-24 DIAGNOSIS — I483 Typical atrial flutter: Secondary | ICD-10-CM | POA: Diagnosis not present

## 2021-08-24 DIAGNOSIS — I25110 Atherosclerotic heart disease of native coronary artery with unstable angina pectoris: Secondary | ICD-10-CM | POA: Diagnosis not present

## 2021-08-24 HISTORY — PX: ESOPHAGOGASTRODUODENOSCOPY (EGD) WITH PROPOFOL: SHX5813

## 2021-08-24 HISTORY — PX: BIOPSY: SHX5522

## 2021-08-24 SURGERY — ESOPHAGOGASTRODUODENOSCOPY (EGD) WITH PROPOFOL
Anesthesia: Monitor Anesthesia Care

## 2021-08-24 MED ORDER — PROPOFOL 500 MG/50ML IV EMUL
INTRAVENOUS | Status: DC | PRN
  Administered 2021-08-24: 125 ug/kg/min via INTRAVENOUS

## 2021-08-24 MED ORDER — PROPOFOL 10 MG/ML IV BOLUS
INTRAVENOUS | Status: DC | PRN
  Administered 2021-08-24: 10 mg via INTRAVENOUS
  Administered 2021-08-24: 30 mg via INTRAVENOUS

## 2021-08-24 MED ORDER — PROPOFOL 500 MG/50ML IV EMUL
INTRAVENOUS | Status: AC
  Filled 2021-08-24: qty 50

## 2021-08-24 MED ORDER — LACTATED RINGERS IV SOLN: INTRAVENOUS | Status: DC

## 2021-08-24 SURGICAL SUPPLY — 15 items

## 2021-08-24 NOTE — Op Note (Signed)
[facility] Patient Name: Jason Santana Procedure Date: 08/24/2021 MRN: [id number] Attending MD: Milus Banister , MD Date of Birth: 1932-06-05 CSN: [id number] Age: 87 Admit Type: Outpatient Procedure:                Upper GI endoscopy Indications:              Recently diagnosed cirrhosis Providers:                Milus Banister, MD, Dulcy Fanny, Cherylynn Ridges, Technician, [address]. Jason Resides CRNA, CRNA Referring MD:              Medicines:                Monitored Anesthesia Care Complications:            No immediate complications. Estimated blood loss:                            None. Estimated Blood Loss:     Estimated blood loss: none. Procedure:                Pre-Anesthesia Assessment:                           - Prior to the procedure, a History and Physical                            was performed, and patient medications and                            allergies were reviewed. The patient's tolerance of                            previous anesthesia was also reviewed. The risks                            and benefits of the procedure and the sedation                            options and risks were discussed with the patient.                            All questions were answered, and informed consent                            was obtained. Prior Anticoagulants: The patient has                            taken Coumadin (warfarin), last dose was 5 days                            prior to procedure. ASA Grade Assessment: III - A  patient with severe systemic disease. After                            reviewing the risks and benefits, the patient was                            deemed in satisfactory condition to undergo the                            procedure.                           After obtaining informed consent, the endoscope was                            passed under direct vision. Throughout the                             procedure, the patient's blood pressure, pulse, and                            oxygen saturations were monitored continuously. The                            [id number]-H190 ([id number]) Olympus endoscope was introduced                            through the mouth, and advanced to the second part                            of duodenum. The upper GI endoscopy was                            accomplished without difficulty. The patient                            tolerated the procedure well. Scope In: Scope Out: Findings:      Mild inflammation characterized by erosions, erythema, friability and       granularity was found in the gastric antrum. Biopsies were taken with a       cold forceps for histology.      The exam was otherwise without abnormality. No portal gastropathy, no       gastric varices, no esophageal varices. Impression:               - Moderate, non-specific gastritis. Biopsied to                            check for H. pylori.                           - No signs of portal hypertension (no varices, no                            portal gastropathy). Moderate Sedation:      Not Applicable -  Patient had care per Anesthesia. Recommendation:           - Patient has a contact number available for                            emergencies. The signs and symptoms of potential                            delayed complications were discussed with the                            patient. Return to normal activities tomorrow.                            Written discharge instructions were provided to the                            patient.                           - Resume previous diet.                           - Continue present medications. You can resume your                            coumadin today.                           - Await pathology results.                           - Dr. Ardis Hughs' office will contact you to arrange                            follow up  appt in the office in the next few weeks. Procedure Code(s):        --- Professional ---                           [phone], Esophagogastroduodenoscopy, flexible,                            transoral; with biopsy, single or multiple Diagnosis Code(s):        --- Professional ---                           K29.70, Gastritis, unspecified, without bleeding                           R10.13, Epigastric pain CPT copyright 2019 American [employer] Association. All rights reserved. The codes documented in this report are preliminary and upon coder review may  be revised to meet current compliance requirements. Milus Banister, MD 08/24/2021 8:49:18 AM This report has been signed electronically. Number of Addenda: 0

## 2021-08-24 NOTE — Interval H&P Note (Signed)
History and Physical Interval Note:  08/24/2021 7:20 AM  Jason Santana  has presented today for surgery, with the diagnosis of varicies screen.  The various methods of treatment have been discussed with the patient and family. After consideration of risks, benefits and other options for treatment, the patient has consented to  Procedure(s): ESOPHAGOGASTRODUODENOSCOPY (EGD) WITH PROPOFOL (N/A) as a surgical intervention.  The patient's history has been reviewed, patient examined, no change in status, stable for surgery.  I have reviewed the patient's chart and labs.  Questions were answered to the patient's satisfaction.     Milus Banister

## 2021-08-24 NOTE — Anesthesia Postprocedure Evaluation (Signed)
Anesthesia Post Note  Patient: ROGEN PORTE  Procedure(s) Performed: ESOPHAGOGASTRODUODENOSCOPY (EGD) WITH PROPOFOL BIOPSY     Patient location during evaluation: Endoscopy Anesthesia Type: MAC Level of consciousness: awake and alert Pain management: pain level controlled Vital Signs Assessment: post-procedure vital signs reviewed and stable Respiratory status: spontaneous breathing, nonlabored ventilation and respiratory function stable Cardiovascular status: blood pressure returned to baseline and stable Postop Assessment: no apparent nausea or vomiting Anesthetic complications: no   No notable events documented.  Last Vitals:  Vitals:   08/24/21 0910 08/24/21 0911  BP:  (!) 163/63  Pulse: 70 68  Resp: 18 16  Temp:    SpO2: 100% 100%    Last Pain:  Vitals:   08/24/21 0854  TempSrc: Oral                 Lidia Collum

## 2021-08-24 NOTE — Anesthesia Procedure Notes (Signed)
Procedure Name: MAC Date/Time: 08/24/2021 8:35 AM Performed by: Lollie Sails, CRNA Pre-anesthesia Checklist: Patient identified, Emergency Drugs available, Suction available, Patient being monitored and Timeout performed Oxygen Delivery Method: Simple face mask Preoxygenation: POM used. Placement Confirmation: positive ETCO2

## 2021-08-24 NOTE — Transfer of Care (Signed)
Immediate Anesthesia Transfer of Care Note  Patient: Jason Santana  Procedure(s) Performed: ESOPHAGOGASTRODUODENOSCOPY (EGD) WITH PROPOFOL BIOPSY  Patient Location: PACU  Anesthesia Type:MAC  Level of Consciousness: drowsy  Airway & Oxygen Therapy: Patient Spontanous Breathing and Patient connected to face mask oxygen  Post-op Assessment: Report given to RN, Post -op Vital signs reviewed and stable and Patient moving all extremities X 4  Post vital signs: Reviewed and stable  Last Vitals:  Vitals Value Taken Time  BP 140/47   Temp    Pulse 66   Resp    SpO2 100     Last Pain:  Vitals:   08/24/21 0734  TempSrc: Oral         Complications: No notable events documented.

## 2021-08-25 ENCOUNTER — Encounter (HOSPITAL_COMMUNITY): Payer: Self-pay | Admitting: Gastroenterology

## 2021-08-29 DIAGNOSIS — Z7901 Long term (current) use of anticoagulants: Secondary | ICD-10-CM | POA: Diagnosis not present

## 2021-08-29 LAB — SURGICAL PATHOLOGY

## 2021-08-31 DIAGNOSIS — Z7901 Long term (current) use of anticoagulants: Secondary | ICD-10-CM | POA: Diagnosis not present

## 2021-09-05 DIAGNOSIS — R3912 Poor urinary stream: Secondary | ICD-10-CM | POA: Diagnosis not present

## 2021-09-06 ENCOUNTER — Encounter: Payer: Medicare Other | Admitting: Gastroenterology

## 2021-09-06 DIAGNOSIS — R051 Acute cough: Secondary | ICD-10-CM | POA: Diagnosis not present

## 2021-09-06 DIAGNOSIS — Z03818 Encounter for observation for suspected exposure to other biological agents ruled out: Secondary | ICD-10-CM | POA: Diagnosis not present

## 2021-09-14 DIAGNOSIS — Z7901 Long term (current) use of anticoagulants: Secondary | ICD-10-CM | POA: Diagnosis not present

## 2021-09-15 ENCOUNTER — Encounter: Payer: Medicare Other | Admitting: Gastroenterology

## 2021-09-27 ENCOUNTER — Ambulatory Visit (INDEPENDENT_AMBULATORY_CARE_PROVIDER_SITE_OTHER): Payer: Medicare Other | Admitting: Gastroenterology

## 2021-09-27 ENCOUNTER — Encounter: Payer: Self-pay | Admitting: Gastroenterology

## 2021-09-27 VITALS — BP 146/52 | HR 78 | Ht 69.0 in | Wt 188.4 lb

## 2021-09-27 DIAGNOSIS — K7460 Unspecified cirrhosis of liver: Secondary | ICD-10-CM | POA: Diagnosis not present

## 2021-09-27 NOTE — Patient Instructions (Signed)
If you are age 86 or older, your body mass index should be between 23-30. Your Body mass index is 27.82 kg/m. If this is out of the aforementioned range listed, please consider follow up with your Primary Care Provider. ________________________________________________________  The [facility] providers would like to encourage you to use [facility] to communicate with providers for non-urgent requests or questions.  Due to long hold times on the telephone, sending your provider a message by [employer] may be a faster and more efficient way to get a response.  Please allow 48 business hours for a response.  Please remember that this is for non-urgent requests.  _______________________________________________________  Jason Santana will be due for follow up in 6 months (July 2023).  We will contact you to schedule this appointment.   You will also need blood work and a RUQ ultrasound a few weeks before you follow up appointment.  We will contact you to schedule the ultrasound. The lab work can be done anytime Mon-Fri from 800am-500pm and no appointment is needed.  Thank you for entrusting me with your care and choosing [facility].  Dr Ardis Hughs

## 2021-09-27 NOTE — Progress Notes (Signed)
Review of pertinent gastrointestinal problems: 1.  Cirrhosis (cryptogenic, negative alcohol drinker, negative general work-up below): Normal platelets AST slightly elevated at 52, normal bilirubin) EGD December 2022 Dr. Ardis Hughs mild nonspecific gastritis.  No esophageal varices, no gastric varices, no portal gastropathy.  Biopsies were negative for H. Pylori Laboratory work-up late 2022 TTG negative, IgA normal, anti-smooth muscle antibody negative, antimitochondrial antibodies positive at 37, alpha-1 antitrypsin level normal, alpha-fetoprotein level normal, ANA negative, hepatitis C antibody negative, hepatitis B surface antigen nonreactive, hepatitis B surface antibody nonreactive, hepatitis A total antibody negative, Alpha-fetoprotein December 2022 normal Abdominal ultrasound November 2022 showed cirrhosis without evidence of discrete tumors Meld score: Cannot calculate due to Coumadin use Hepatitis A/B immunization series started 2022 by primary care physician 2.  Routine risk for colon cancer.  Colonoscopy Dr. Arta Silence May 2019 while hospitalized for bleeding showed diverticulosis, hemorrhoids, no polyps.    HPI: This is a very pleasant 88 year old man who is here with his wife of 59 years  He was previously under the care of Dr. Shirley Muscat from [facility] gastroenterology.  He underwent an EGD 2019 Dr. Alessandra Bevels that showed no sign of esophageal varices or portal hypertension.  He did have a gastric ulcer however.  I last saw him at the time of his upper endoscopy.  See that results summarized above.  For the last month or 2 he has never heard that he had chronic liver disease.  He has never been a big alcohol drinker.  Liver disease does not run in his family.  He does intermittently have trouble with swelling of his right lower extremity.  He has had surgery there twice on his ankle.  He takes low-dose Lasix 2 to 3 days a week only.  He has never been bothered by ascites, no overt GI  bleeding, no signs of encephalopathy.   ROS: complete GI ROS as described in HPI, all other review negative.  Constitutional:  No unintentional weight loss   Past Medical History:  Diagnosis Date   Anticoagulation adequate 05/23/2019   Atrial fibrillation (HCC)    CAD (coronary artery disease) of artery bypass graft 2000   2000 with multiple bypasses    Chronic anticoagulation    Coumadin therapy for mechanical aortic valve. Postoperative atrial fibrillation.    Essential hypertension    H/O mechanical aortic valve replacement 2000   [employer] AVR   Hypothyroidism    Premature ventricular contractions    S/P ablation of atrial fibrillation 05/22/19 05/23/2019   Typical atrial flutter ([place]) 05/23/2019    Past Surgical History:  Procedure Laterality Date   AORTIC VALVE REPLACEMENT (AVR)/CORONARY ARTERY BYPASS GRAFTING (CABG)  2000   ATRIAL FIBRILLATION ABLATION N/A 05/22/2019   Procedure: ATRIAL FIBRILLATION ABLATION;  Surgeon: Constance Haw, MD;  Location: [facility];  Service: Cardiovascular;  Laterality: N/A;   BIOPSY  08/24/2021   Procedure: BIOPSY;  Surgeon: Milus Banister, MD;  Location: [facility];  Service: Endoscopy;;   CARDIOVERSION N/A 02/24/2018   Procedure: CARDIOVERSION;  Surgeon: Fay Records, MD;  Location: [facility] ENDOSCOPY;  Service: Cardiovascular;  Laterality: N/A;   CARDIOVERSION N/A 08/15/2018   Procedure: CARDIOVERSION;  Surgeon: Thayer Headings, MD;  Location[facility] ENDOSCOPY;  Service: Cardiovascular;  Laterality: N/A;   COLONOSCOPY WITH PROPOFOL N/A 01/08/2018   Procedure: COLONOSCOPY WITH PROPOFOL;  Surgeon: Arta Silence, MD;  Location: [facility] ENDOSCOPY;  Service: Gastroenterology;  Laterality: N/A;   CORONARY/GRAFT ANGIOGRAPHY N/A 08/14/2018   Procedure: CORONARY/GRAFT ANGIOGRAPHY;  Surgeon:  [name], MD;  Location: [facility];  Service: Cardiovascular;  Laterality: N/A;   ESOPHAGOGASTRODUODENOSCOPY (EGD) WITH PROPOFOL N/A  01/06/2018   Procedure: ESOPHAGOGASTRODUODENOSCOPY (EGD) WITH PROPOFOL;  Surgeon: Otis Brace, MD;  Location: [facility] ENDOSCOPY;  Service: Gastroenterology;  Laterality: N/A;   ESOPHAGOGASTRODUODENOSCOPY (EGD) WITH PROPOFOL N/A 08/24/2021   Procedure: ESOPHAGOGASTRODUODENOSCOPY (EGD) WITH PROPOFOL;  Surgeon: Milus Banister, MD;  Location: [facility] ENDOSCOPY;  Service: Endoscopy;  Laterality: N/A;   FOOT SURGERY Right    x 2    Current Outpatient Medications  Medication Sig Dispense Refill   Cholecalciferol (VITAMIN D) 2000 units tablet Take 2,000 Units by mouth daily.     diltiazem (CARDIZEM) 30 MG tablet TAKE 1 TABLET AS NEEDED FOR PALPITATIONS OR HEART RATE >100 30 tablet 6   diltiazem (TIADYLT ER) 120 MG 24 hr capsule Take 1 capsule (120 mg total) by mouth daily. 90 capsule 3   diphenhydramine-acetaminophen (TYLENOL PM) 25-500 MG TABS tablet Take 0.5 tablets by mouth at bedtime as needed (sleep).     ferrous sulfate 325 (65 FE) MG tablet Take 325 mg by mouth daily with supper.     furosemide (LASIX) 20 MG tablet Take 20 mg by mouth daily as needed for fluid.     levothyroxine (SYNTHROID) 125 MCG tablet Take 125 mcg by mouth daily before breakfast.      Multiple Vitamins-Minerals (PRESERVISION AREDS 2 PO) Take 1 capsule by mouth 2 (two) times daily.     pantoprazole (PROTONIX) 40 MG tablet Take 40 mg by mouth 4 (four) times a week.     Polyethyl Glycol-Propyl Glycol (SYSTANE OP) Place 1 drop into both eyes daily as needed (dry eyes).     spironolactone (ALDACTONE) 25 MG tablet Take 1 tablet (25 mg total) by mouth daily. 90 tablet 3   tobramycin (TOBREX) 0.3 % ophthalmic solution Place 1 drop into the left eye See admin instructions. Instill 1 drop into left eye 1 day before, the day of, and the day after eye injections administered at Dr's office     triamcinolone cream (KENALOG) 0.1 % Apply 1 application topically 2 (two) times daily. 30 g 0   valsartan (DIOVAN) 320 MG tablet TAKE 1 TABLET BY  MOUTH EVERY DAY (Patient taking differently: Take 320 mg by mouth daily.) 90 tablet 2   warfarin (COUMADIN) 5 MG tablet Take 5-7.5 mg by mouth See admin instructions. Per patient Sunday, Harrell Lark, & Saturday taking 7.5 mg = 1 & 1/2 tab. All other days taking 1  tab = 5mg  mg on  Mon, Wed, Friday     No current facility-administered medications for this visit.    Allergies as of 09/27/2021 - Review Complete 09/27/2021  Allergen Reaction Noted   Carvedilol Shortness Of Breath 10/14/2019   Lopid [gemfibrozil] Other (See Comments) 02/25/2014   Monopril [fosinopril] Other (See Comments) 02/25/2014   Vicodin [hydrocodone-acetaminophen]  02/25/2014   Phenergan [promethazine hcl] Other (See Comments) 02/25/2014   Zocor [simvastatin]  02/25/2014   Benicar [olmesartan]  02/25/2014   Codeine Nausea And Vomiting 11/25/2013   Prilosec [omeprazole]  02/25/2014    Family History  Problem Relation Age of Onset   Hypertension Mother    Heart attack Mother    Hypertension Father    Colon cancer Neg Hx    Esophageal cancer Neg Hx    Pancreatic cancer Neg Hx    Stomach cancer Neg Hx     Social History   Socioeconomic History   Marital status:  Married    Spouse name: Not on file   Number of children: Not on file   Years of education: Not on file   Highest education level: Not on file  Occupational History   Not on file  Tobacco Use   Smoking status: Former    Types: Cigarettes   Smokeless tobacco: Never   Tobacco comments:    quit 57 years ago  Vaping Use   Vaping Use: Never used  Substance and Sexual Activity   Alcohol use: No   Drug use: No   Sexual activity: Not on file  Other Topics Concern   Not on file  Social History Narrative   Not on file   Social Determinants of Health   Financial Resource Strain: Not on file  Food Insecurity: Not on file  Transportation Needs: Not on file  Physical Activity: Not on file  Stress: Not on file  Social Connections: Not on file   Intimate Partner Violence: Not on file     Physical Exam: BP (!) 146/52    Pulse 78    Ht 5\' 9"  (1.753 m)    Wt 188 lb 6.4 oz (85.5 kg)    BMI 27.82 kg/m  Constitutional: generally well-appearing Psychiatric: alert and oriented x3 Abdomen: soft, nontender, nondistended, no obvious ascites, no peritoneal signs, normal bowel sounds Trace lower extremity edema right ankle only  Assessment and plan: 88 y.o. male with cryptogenic cirrhosis, well compensated  We had a very nice discussion about the etiology of cirrhosis, long-term consequences.  He seems to have very good overall liver function based on blood work and history and physical.  He is getting immunized for hepatitis a and B through his primary care physician.  He is currently up-to-date on hepatoma screening.  We will arrange return office visit in about 6 months and shortly before that he will have right upper quadrant ultrasound for hepatoma screening as well as repeat battery of blood tests including CBC, complete metabolic profile.  Coags are not helpful for him because he is on Coumadin.  Please see the "Patient Instructions" section for addition details about the plan.  Owens Loffler, MD [facility] 09/27/2021, 10:37 AM   Total time on date of encounter was 35 minutes (this included time spent preparing to see the patient reviewing records; obtaining and/or reviewing separately obtained history; performing a medically appropriate exam and/or evaluation; counseling and educating the patient and family if present; ordering medications, tests or procedures if applicable; and documenting clinical information in the health record).

## 2021-09-28 ENCOUNTER — Other Ambulatory Visit: Payer: Self-pay | Admitting: Cardiology

## 2021-09-29 DIAGNOSIS — N183 Chronic kidney disease, stage 3 unspecified: Secondary | ICD-10-CM | POA: Diagnosis not present

## 2021-09-29 DIAGNOSIS — I2510 Atherosclerotic heart disease of native coronary artery without angina pectoris: Secondary | ICD-10-CM | POA: Diagnosis not present

## 2021-09-29 DIAGNOSIS — I10 Essential (primary) hypertension: Secondary | ICD-10-CM | POA: Diagnosis not present

## 2021-09-29 DIAGNOSIS — I4891 Unspecified atrial fibrillation: Secondary | ICD-10-CM | POA: Diagnosis not present

## 2021-09-29 DIAGNOSIS — N401 Enlarged prostate with lower urinary tract symptoms: Secondary | ICD-10-CM | POA: Diagnosis not present

## 2021-09-29 DIAGNOSIS — E782 Mixed hyperlipidemia: Secondary | ICD-10-CM | POA: Diagnosis not present

## 2021-09-29 DIAGNOSIS — D509 Iron deficiency anemia, unspecified: Secondary | ICD-10-CM | POA: Diagnosis not present

## 2021-09-29 DIAGNOSIS — M169 Osteoarthritis of hip, unspecified: Secondary | ICD-10-CM | POA: Diagnosis not present

## 2021-09-29 DIAGNOSIS — E039 Hypothyroidism, unspecified: Secondary | ICD-10-CM | POA: Diagnosis not present

## 2021-10-05 DIAGNOSIS — L0390 Cellulitis, unspecified: Secondary | ICD-10-CM | POA: Diagnosis not present

## 2021-10-05 DIAGNOSIS — R609 Edema, unspecified: Secondary | ICD-10-CM | POA: Diagnosis not present

## 2021-10-05 DIAGNOSIS — Z5181 Encounter for therapeutic drug level monitoring: Secondary | ICD-10-CM | POA: Diagnosis not present

## 2021-10-16 DIAGNOSIS — Z23 Encounter for immunization: Secondary | ICD-10-CM | POA: Diagnosis not present

## 2021-10-16 DIAGNOSIS — T148XXA Other injury of unspecified body region, initial encounter: Secondary | ICD-10-CM | POA: Diagnosis not present

## 2021-10-16 DIAGNOSIS — L0390 Cellulitis, unspecified: Secondary | ICD-10-CM | POA: Diagnosis not present

## 2021-10-18 DIAGNOSIS — Z20822 Contact with and (suspected) exposure to covid-19: Secondary | ICD-10-CM | POA: Diagnosis not present

## 2021-10-21 ENCOUNTER — Ambulatory Visit
Admission: EM | Admit: 2021-10-21 | Discharge: 2021-10-21 | Disposition: A | Discharge status: Home / Self Care | Payer: Medicare Other | Attending: Physician Assistant | Admitting: Physician Assistant

## 2021-10-21 ENCOUNTER — Encounter: Payer: Self-pay | Admitting: Emergency Medicine

## 2021-10-21 ENCOUNTER — Other Ambulatory Visit: Payer: Self-pay

## 2021-10-21 DIAGNOSIS — N3000 Acute cystitis without hematuria: Secondary | ICD-10-CM

## 2021-10-21 LAB — POCT URINALYSIS DIP (MANUAL ENTRY)
Bilirubin, UA: NEGATIVE
Glucose, UA: NEGATIVE mg/dL
Ketones, POC UA: NEGATIVE mg/dL
Nitrite, UA: NEGATIVE
Protein Ur, POC: 30 mg/dL — AB
Spec Grav, UA: 1.01 (ref 1.010–1.025)
Urobilinogen, UA: 0.2 E.U./dL
pH, UA: 6 (ref 5.0–8.0)

## 2021-10-21 MED ORDER — AMOXICILLIN-POT CLAVULANATE 875-125 MG PO TABS: 1.0000 | ORAL_TABLET | Freq: Two times a day (BID) | ORAL | 0 refills | Status: DC

## 2021-10-21 NOTE — ED Provider Notes (Signed)
[facility]    CSN: [id number] Arrival date & time: 10/21/21  0810      History   Chief Complaint Chief Complaint  Patient presents with   Urinary Frequency    HPI Jason Santana is a 88 y.o. male.   Patient here today for evaluation of urinary frequency that started last night.  He has not had any hematuria.  He denies any fever.  He has had UTIs in the past and states symptoms are similar.  He has seen urology who did prescribe medication for his bladder.  The history is provided by the patient.  Urinary Frequency Pertinent negatives include no abdominal pain.   Past Medical History:  Diagnosis Date   Anticoagulation adequate 05/23/2019   Atrial fibrillation ([place])    CAD (coronary artery disease) of artery bypass graft 2000   2000 with multiple bypasses    Chronic anticoagulation    Coumadin therapy for mechanical aortic valve. Postoperative atrial fibrillation.    Essential hypertension    H/O mechanical aortic valve replacement 2000   St Jude AVR   Hypothyroidism    Premature ventricular contractions    S/P ablation of atrial fibrillation 05/22/19 05/23/2019   Typical atrial flutter ([place]) 05/23/2019    Patient Active Problem List   Diagnosis Date Noted   AKI (acute kidney injury) ([place]) 02/28/2020   Symptomatic anemia 02/27/2020   Thoracic aortic aneurysm 11/08/2019   Anticoagulation adequate 05/23/2019   Typical atrial flutter ([place]) 05/23/2019   S/P ablation of atrial fibrillation 05/22/19 05/23/2019   Atrial fibrillation ([place]) 05/22/2019   Chest pain 08/11/2018   Syncope 01/04/2018   Coronary artery disease involving native coronary artery of native heart with unstable angina pectoris ([place]) 01/04/2018   GI bleed 01/03/2018   Macular degeneration of left eye 05/26/2017   Hypothyroidism 05/26/2017   Hyponatremia 05/25/2017   Sepsis ([place]) 05/25/2017   Muscle weakness 12/13/2014   Paroxysmal atrial flutter ([place]) 11/25/2013   Essential hypertension  11/25/2013   H/O mechanical aortic valve replacement 11/25/2013   CAD (coronary artery disease) of artery bypass graft 11/25/2013   Chronic anticoagulation 11/25/2013   Premature ventricular contractions 11/25/2013    Past Surgical History:  Procedure Laterality Date   AORTIC VALVE REPLACEMENT (AVR)/CORONARY ARTERY BYPASS GRAFTING (CABG)  2000   ATRIAL FIBRILLATION ABLATION N/A 05/22/2019   Procedure: ATRIAL FIBRILLATION ABLATION;  Surgeon: Constance Haw, MD;  Location: [facility];  Service: Cardiovascular;  Laterality: N/A;   BIOPSY  08/24/2021   Procedure: BIOPSY;  Surgeon: Milus Banister, MD;  Location: [facility];  Service: Endoscopy;;   CARDIOVERSION N/A 02/24/2018   Procedure: CARDIOVERSION;  Surgeon: Fay Records, MD;  Location: [facility] ENDOSCOPY;  Service: Cardiovascular;  Laterality: N/A;   CARDIOVERSION N/A 08/15/2018   Procedure: CARDIOVERSION;  Surgeon: Thayer Headings, MD;  Location: [facility] ENDOSCOPY;  Service: Cardiovascular;  Laterality: N/A;   COLONOSCOPY WITH PROPOFOL N/A 01/08/2018   Procedure: COLONOSCOPY WITH PROPOFOL;  Surgeon: Arta Silence, MD;  Location: [facility];  Service: Gastroenterology;  Laterality: N/A;   CORONARY/GRAFT ANGIOGRAPHY N/A 08/14/2018   Procedure: CORONARY/GRAFT ANGIOGRAPHY;  Surgeon: [name], MD;  Location: [facility];  Service: Cardiovascular;  Laterality: N/A;   ESOPHAGOGASTRODUODENOSCOPY (EGD) WITH PROPOFOL N/A 01/06/2018   Procedure: ESOPHAGOGASTRODUODENOSCOPY (EGD) WITH PROPOFOL;  Surgeon: Otis Brace, MD;  Location: [facility];  Service: Gastroenterology;  Laterality: N/A;   ESOPHAGOGASTRODUODENOSCOPY (EGD) WITH PROPOFOL N/A 08/24/2021   Procedure: ESOPHAGOGASTRODUODENOSCOPY (EGD) WITH PROPOFOL;  Surgeon:  Milus Banister, MD;  Location: [facility];  Service: Endoscopy;  Laterality: N/A;   FOOT SURGERY Right    x 2       Home Medications    Prior to Admission medications   Medication Sig  Start Date End Date Taking? Authorizing Provider  amoxicillin-clavulanate (AUGMENTIN) 875-125 MG tablet Take 1 tablet by mouth every 12 (twelve) hours. 10/21/21  Yes Francene Finders, PA-C  Cholecalciferol (VITAMIN D) 2000 units tablet Take 2,000 Units by mouth daily.   Yes [provider]  diltiazem (CARDIZEM) 30 MG tablet TAKE 1 TABLET AS NEEDED FOR PALPITATIONS OR HEART RATE >100 10/05/19  Yes Camnitz, Ocie Doyne, MD  diltiazem (TIADYLT ER) 120 MG 24 hr capsule Take 1 capsule (120 mg total) by mouth daily. 06/14/21  Yes Camnitz, Will Hassell Done, MD  diphenhydramine-acetaminophen (TYLENOL PM) 25-500 MG TABS tablet Take 0.5 tablets by mouth at bedtime as needed (sleep).   Yes [provider]  ferrous sulfate 325 (65 FE) MG tablet Take 325 mg by mouth daily with supper.   Yes [provider]  furosemide (LASIX) 20 MG tablet Take 20 mg by mouth daily as needed for fluid.   Yes [provider]  levothyroxine (SYNTHROID) 125 MCG tablet Take 125 mcg by mouth daily before breakfast.  11/21/18  Yes [provider]  Multiple Vitamins-Minerals (PRESERVISION AREDS 2 PO) Take 1 capsule by mouth 2 (two) times daily.   Yes [provider]  pantoprazole (PROTONIX) 40 MG tablet Take 40 mg by mouth 4 (four) times a week.   Yes [provider]  Polyethyl Glycol-Propyl Glycol (SYSTANE OP) Place 1 drop into both eyes daily as needed (dry eyes).   Yes [provider]  spironolactone (ALDACTONE) 25 MG tablet TAKE 1 TABLET (25 MG TOTAL) BY MOUTH DAILY. 09/28/21  Yes Camnitz, Will Hassell Done, MD  tobramycin (TOBREX) 0.3 % ophthalmic solution Place 1 drop into the left eye See admin instructions. Instill 1 drop into left eye 1 day before, the day of, and the day after eye injections administered at Dr's office   Yes [provider]  triamcinolone cream (KENALOG) 0.1 % Apply 1 application topically 2 (two) times daily. 02/06/21  Yes Wieters, Hallie C, PA-C   valsartan (DIOVAN) 320 MG tablet TAKE 1 TABLET BY MOUTH EVERY DAY Patient taking differently: Take 320 mg by mouth daily. 06/14/21  Yes Camnitz, Will Hassell Done, MD  warfarin (COUMADIN) 5 MG tablet Take 5-7.5 mg by mouth See admin instructions. Per patient Sunday, Harrell Lark, & Saturday taking 7.5 mg = 1 & 1/2 tab. All other days taking 1  tab = 5mg  mg on  Mon, Wed, Friday   Yes [provider]    Family History Family History  Problem Relation Age of Onset   Hypertension Mother    Heart attack Mother    Hypertension Father    Colon cancer Neg Hx    Esophageal cancer Neg Hx    Pancreatic cancer Neg Hx    Stomach cancer Neg Hx     Social History Social History   Tobacco Use   Smoking status: Former    Types: Cigarettes   Smokeless tobacco: Never   Tobacco comments:    quit 57 years ago  Vaping Use   Vaping Use: Never used  Substance Use Topics   Alcohol use: No   Drug use: No     Allergies   Carvedilol, Lopid [gemfibrozil], Monopril [fosinopril], Vicodin [hydrocodone-acetaminophen], Phenergan [promethazine hcl], Zocor [  simvastatin], Benicar [olmesartan], Codeine, and Prilosec [omeprazole]   Review of Systems Review of Systems  Constitutional:  Negative for chills and fever.  Eyes:  Negative for discharge and redness.  Gastrointestinal:  Negative for abdominal pain, nausea and vomiting.  Genitourinary:  Positive for frequency. Negative for dysuria.  Musculoskeletal:  Negative for back pain.  Skin:  Positive for color change and wound.    Physical Exam Triage Vital Signs ED Triage Vitals [10/21/21 0842]  Enc Vitals Group     BP (!) 177/65     Pulse Rate 76     Resp      Temp 97.9 F (36.6 C)     Temp Source Oral     SpO2 97 %     Weight 188 lb 7.9 oz (85.5 kg)     Height 5\' 9"  (1.753 m)     Head Circumference      Peak Flow      Pain Score 0     Pain Loc      Pain Edu?      Excl. in [place]?    No data found.  Updated Vital Signs BP (!) 177/65 (BP  Location: Left Arm)    Pulse 76    Temp 97.9 F (36.6 C) (Oral)    Ht 5\' 9"  (1.753 m)    Wt 188 lb 7.9 oz (85.5 kg)    SpO2 97%    BMI 27.84 kg/m      Physical Exam Vitals and nursing note reviewed.  Constitutional:      General: He is not in acute distress.    Appearance: Normal appearance. He is not ill-appearing.  HENT:     Head: Normocephalic and atraumatic.  Eyes:     Conjunctiva/sclera: Conjunctivae normal.  Cardiovascular:     Rate and Rhythm: Normal rate.  Pulmonary:     Effort: Pulmonary effort is normal.  Neurological:     Mental Status: He is alert.  Psychiatric:        Mood and Affect: Mood normal.        Behavior: Behavior normal.        Thought Content: Thought content normal.     UC Treatments / Results  Labs (all labs ordered are listed, but only abnormal results are displayed) Labs Reviewed  POCT URINALYSIS DIP (MANUAL ENTRY) - Abnormal; Notable for the following components:      Result Value   Blood, UA moderate (*)    Protein Ur, POC =30 (*)    Leukocytes, UA Large (3+) (*)    All other components within normal limits  URINE CULTURE    EKG   Radiology No results found.  Procedures Procedures (including critical care time)  Medications Ordered in UC Medications - No data to display  Initial Impression / Assessment and Plan / UC Course  I have reviewed the triage vital signs and the nursing notes.  Pertinent labs & imaging results that were available during my care of the patient were reviewed by me and considered in my medical decision making (see chart for details).    Antibiotic prescribed to cover UTI.  Urine culture ordered.  Encouraged follow-up if symptoms fail to improve or worsen.  Final Clinical Impressions(s) / UC Diagnoses   Final diagnoses:  Acute cystitis without hematuria   Discharge Instructions   None    ED Prescriptions     Medication Sig Dispense Auth. Provider   amoxicillin-clavulanate (AUGMENTIN) 875-125 MG  tablet Take 1 tablet  by mouth every 12 (twelve) hours. 14 tablet Francene Finders, PA-C      PDMP not reviewed this encounter.   Francene Finders, PA-C 10/21/21 [phone]

## 2021-10-21 NOTE — ED Triage Notes (Signed)
Patient c/o urinary frequency since last night.  Denies any hematuria, afebrile.

## 2021-10-22 LAB — URINE CULTURE: Culture: <10000 — AB

## 2021-11-02 DIAGNOSIS — Z7901 Long term (current) use of anticoagulants: Secondary | ICD-10-CM | POA: Diagnosis not present

## 2021-11-08 ENCOUNTER — Other Ambulatory Visit: Payer: Self-pay

## 2021-11-08 ENCOUNTER — Ambulatory Visit
Admission: EM | Admit: 2021-11-08 | Discharge: 2021-11-08 | Disposition: A | Discharge status: Home / Self Care | Payer: Medicare Other | Attending: Physician Assistant | Admitting: Physician Assistant

## 2021-11-08 DIAGNOSIS — N3000 Acute cystitis without hematuria: Secondary | ICD-10-CM | POA: Insufficient documentation

## 2021-11-08 LAB — POCT URINALYSIS DIP (MANUAL ENTRY)
Bilirubin, UA: NEGATIVE
Glucose, UA: NEGATIVE mg/dL
Ketones, POC UA: NEGATIVE mg/dL
Nitrite, UA: NEGATIVE
Protein Ur, POC: NEGATIVE mg/dL
Spec Grav, UA: 1.015 (ref 1.010–1.025)
Urobilinogen, UA: 0.2 E.U./dL
pH, UA: 5.5 (ref 5.0–8.0)

## 2021-11-08 MED ORDER — AMOXICILLIN-POT CLAVULANATE 875-125 MG PO TABS: 1.0000 | ORAL_TABLET | Freq: Two times a day (BID) | ORAL | 0 refills | Status: DC

## 2021-11-08 NOTE — ED Provider Notes (Signed)
[facility]    CSN: [id number] Arrival date & time: 11/08/21  0848      History   Chief Complaint Chief Complaint  Patient presents with   Dysuria    HPI Jason Santana is a 88 y.o. male.   Patient here today for evaluation of possible recurrent UTI. He reports he has had urinary urgency, frequency with dysuria that started about 3 days ago. He has had UTI lead to sepsis in the past. He has not had any fever, nausea or vomiting. He has seen urology in the past who cleared patient but did give medication to help with "flow  The history is provided by the patient.   Past Medical History:  Diagnosis Date   Anticoagulation adequate 05/23/2019   Atrial fibrillation ([place])    CAD (coronary artery disease) of artery bypass graft 2000   2000 with multiple bypasses    Chronic anticoagulation    Coumadin therapy for mechanical aortic valve. Postoperative atrial fibrillation.    Essential hypertension    H/O mechanical aortic valve replacement 2000   St Jude AVR   Hypothyroidism    Premature ventricular contractions    S/P ablation of atrial fibrillation 05/22/19 05/23/2019   Typical atrial flutter ([place]) 05/23/2019    Patient Active Problem List   Diagnosis Date Noted   AKI (acute kidney injury) ([place]) 02/28/2020   Symptomatic anemia 02/27/2020   Thoracic aortic aneurysm 11/08/2019   Anticoagulation adequate 05/23/2019   Typical atrial flutter ([place]) 05/23/2019   S/P ablation of atrial fibrillation 05/22/19 05/23/2019   Atrial fibrillation ([place]) 05/22/2019   Chest pain 08/11/2018   Syncope 01/04/2018   Coronary artery disease involving native coronary artery of native heart with unstable angina pectoris ([place]) 01/04/2018   GI bleed 01/03/2018   Macular degeneration of left eye 05/26/2017   Hypothyroidism 05/26/2017   Hyponatremia 05/25/2017   Sepsis ([place]) 05/25/2017   Muscle weakness 12/13/2014   Paroxysmal atrial flutter ([place]) 11/25/2013   Essential hypertension  11/25/2013   H/O mechanical aortic valve replacement 11/25/2013   CAD (coronary artery disease) of artery bypass graft 11/25/2013   Chronic anticoagulation 11/25/2013   Premature ventricular contractions 11/25/2013    Past Surgical History:  Procedure Laterality Date   AORTIC VALVE REPLACEMENT (AVR)/CORONARY ARTERY BYPASS GRAFTING (CABG)  2000   ATRIAL FIBRILLATION ABLATION N/A 05/22/2019   Procedure: ATRIAL FIBRILLATION ABLATION;  Surgeon: Constance Haw, MD;  Location: [facility];  Service: Cardiovascular;  Laterality: N/A;   BIOPSY  08/24/2021   Procedure: BIOPSY;  Surgeon: Milus Banister, MD;  Location: [facility];  Service: Endoscopy;;   CARDIOVERSION N/A 02/24/2018   Procedure: CARDIOVERSION;  Surgeon: Fay Records, MD;  Location: [facility] ENDOSCOPY;  Service: Cardiovascular;  Laterality: N/A;   CARDIOVERSION N/A 08/15/2018   Procedure: CARDIOVERSION;  Surgeon: Thayer Headings, MD;  Location: [facility]OSCOPY;  Service: Cardiovascular;  Laterality: N/A;   COLONOSCOPY WITH PROPOFOL N/A 01/08/2018   Procedure: COLONOSCOPY WITH PROPOFOL;  Surgeon: Arta Silence, MD;  Location: [facility];  Service: Gastroenterology;  Laterality: N/A;   CORONARY/GRAFT ANGIOGRAPHY N/A 08/14/2018   Procedure: CORONARY/GRAFT ANGIOGRAPHY;  Surgeon: [name], MD;  Location: [facility];  Service: Cardiovascular;  Laterality: N/A;   ESOPHAGOGASTRODUODENOSCOPY (EGD) WITH PROPOFOL N/A 01/06/2018   Procedure: ESOPHAGOGASTRODUODENOSCOPY (EGD) WITH PROPOFOL;  Surgeon: Otis Brace, MD;  Location: [facility];  Service: Gastroenterology;  Laterality: N/A;   ESOPHAGOGASTRODUODENOSCOPY (EGD) WITH PROPOFOL N/A 08/24/2021   Procedure: ESOPHAGOGASTRODUODENOSCOPY (EGD) WITH  PROPOFOL;  Surgeon: Milus Banister, MD;  Location: [facility];  Service: Endoscopy;  Laterality: N/A;   FOOT SURGERY Right    x 2       Home Medications    Prior to Admission medications   Medication Sig  Start Date End Date Taking? Authorizing Provider  amoxicillin-clavulanate (AUGMENTIN) 875-125 MG tablet Take 1 tablet by mouth every 12 (twelve) hours. 11/08/21  Yes Francene Finders, PA-C  Cholecalciferol (VITAMIN D) 2000 units tablet Take 2,000 Units by mouth daily.    [provider]  diltiazem (CARDIZEM) 30 MG tablet TAKE 1 TABLET AS NEEDED FOR PALPITATIONS OR HEART RATE >100 10/05/19   Camnitz, Ocie Doyne, MD  diltiazem (TIADYLT ER) 120 MG 24 hr capsule Take 1 capsule (120 mg total) by mouth daily. 06/14/21   Camnitz, Will Hassell Done, MD  diphenhydramine-acetaminophen (TYLENOL PM) 25-500 MG TABS tablet Take 0.5 tablets by mouth at bedtime as needed (sleep).    [provider]  ferrous sulfate 325 (65 FE) MG tablet Take 325 mg by mouth daily with supper.    [provider]  furosemide (LASIX) 20 MG tablet Take 20 mg by mouth daily as needed for fluid.    [provider]  levothyroxine (SYNTHROID) 125 MCG tablet Take 125 mcg by mouth daily before breakfast.  11/21/18   [provider]  Multiple Vitamins-Minerals (PRESERVISION AREDS 2 PO) Take 1 capsule by mouth 2 (two) times daily.    [provider]  pantoprazole (PROTONIX) 40 MG tablet Take 40 mg by mouth 4 (four) times a week.    [provider]  Polyethyl Glycol-Propyl Glycol (SYSTANE OP) Place 1 drop into both eyes daily as needed (dry eyes).    [provider]  spironolactone (ALDACTONE) 25 MG tablet TAKE 1 TABLET (25 MG TOTAL) BY MOUTH DAILY. 09/28/21   Camnitz, Will Hassell Done, MD  tobramycin (TOBREX) 0.3 % ophthalmic solution Place 1 drop into the left eye See admin instructions. Instill 1 drop into left eye 1 day before, the day of, and the day after eye injections administered at Dr's office    [provider]  triamcinolone cream (KENALOG) 0.1 % Apply 1 application topically 2 (two) times daily. 02/06/21   Wieters, Hallie C, PA-C  valsartan (DIOVAN) 320 MG tablet TAKE 1  TABLET BY MOUTH EVERY DAY Patient taking differently: Take 320 mg by mouth daily. 06/14/21   Camnitz, Ocie Doyne, MD  warfarin (COUMADIN) 5 MG tablet Take 5-7.5 mg by mouth See admin instructions. Per patient Sunday, Harrell Lark, & Saturday taking 7.5 mg = 1 & 1/2 tab. All other days taking 1  tab = '5mg'$  mg on  Mon, Wed, Friday    [provider]    Family History Family History  Problem Relation Age of Onset   Hypertension Mother    Heart attack Mother    Hypertension Father    Colon cancer Neg Hx    Esophageal cancer Neg Hx    Pancreatic cancer Neg Hx    Stomach cancer Neg Hx     Social History Social History   Tobacco Use   Smoking status: Former    Types: Cigarettes   Smokeless tobacco: Never   Tobacco comments:    quit 57 years ago  Vaping Use   Vaping Use: Never used  Substance Use Topics   Alcohol use: No   Drug use: No     Allergies   Carvedilol, Lopid [gemfibrozil], Monopril [fosinopril], Vicodin [hydrocodone-acetaminophen], Phenergan [  promethazine hcl], Zocor [simvastatin], Benicar [olmesartan], Codeine, and Prilosec [omeprazole]   Review of Systems Review of Systems  Constitutional:  Negative for chills and fever.  Eyes:  Negative for discharge and redness.  Respiratory:  Negative for shortness of breath.   Gastrointestinal:  Negative for abdominal pain, nausea and vomiting.  Genitourinary:  Positive for frequency and urgency. Negative for dysuria.  Musculoskeletal:  Negative for back pain.    Physical Exam Triage Vital Signs ED Triage Vitals  Enc Vitals Group     BP      Pulse      Resp      Temp      Temp src      SpO2      Weight      Height      Head Circumference      Peak Flow      Pain Score      Pain Loc      Pain Edu?      Excl. in [place]?    No data found.  Updated Vital Signs BP (!) 169/65 (BP Location: Left Arm)    Pulse 68    Temp 97.6 F (36.4 C) (Oral)    Resp 18    SpO2 95%   Physical Exam Vitals and nursing note  reviewed.  Constitutional:      General: He is not in acute distress.    Appearance: Normal appearance. He is not ill-appearing.  HENT:     Head: Normocephalic and atraumatic.  Eyes:     Conjunctiva/sclera: Conjunctivae normal.  Cardiovascular:     Rate and Rhythm: Normal rate.  Pulmonary:     Effort: Pulmonary effort is normal.  Neurological:     Mental Status: He is alert.  Psychiatric:        Mood and Affect: Mood normal.        Behavior: Behavior normal.        Thought Content: Thought content normal.     UC Treatments / Results  Labs (all labs ordered are listed, but only abnormal results are displayed) Labs Reviewed  POCT URINALYSIS DIP (MANUAL ENTRY) - Abnormal; Notable for the following components:      Result Value   Blood, UA trace-intact (*)    Leukocytes, UA Trace (*)    All other components within normal limits  URINE CULTURE    EKG   Radiology No results found.  Procedures Procedures (including critical care time)  Medications Ordered in UC Medications - No data to display  Initial Impression / Assessment and Plan / UC Course  I have reviewed the triage vital signs and the nursing notes.  Pertinent labs & imaging results that were available during my care of the patient were reviewed by me and considered in my medical decision making (see chart for details).    Will treat to cover UTI given history and urine culture ordered. Recommended follow up with urology. Patient expresses understanding.   Final Clinical Impressions(s) / UC Diagnoses   Final diagnoses:  Acute cystitis without hematuria     Discharge Instructions       Please follow up with urology as soon as possible.        ED Prescriptions     Medication Sig Dispense Auth. Provider   amoxicillin-clavulanate (AUGMENTIN) 875-125 MG tablet Take 1 tablet by mouth every 12 (twelve) hours. 14 tablet Francene Finders, PA-C      PDMP not reviewed this encounter.  Francene Finders, PA-C 11/08/21 [phone]

## 2021-11-08 NOTE — ED Triage Notes (Signed)
Pt c/o urgency, dysuria, onset ~ Monday. Pt states this is recurring and once caused sepsis/hospitalization. States was seen by urology and given "something to help with my flow."  ?

## 2021-11-08 NOTE — Discharge Instructions (Signed)
?  Please follow up with urology as soon as possible.  ? ? ?

## 2021-11-09 LAB — URINE CULTURE: Culture: <10000 — AB

## 2021-11-16 DIAGNOSIS — N3020 Other chronic cystitis without hematuria: Secondary | ICD-10-CM | POA: Diagnosis not present

## 2021-11-17 DIAGNOSIS — Z7901 Long term (current) use of anticoagulants: Secondary | ICD-10-CM | POA: Diagnosis not present

## 2021-11-22 DIAGNOSIS — G609 Hereditary and idiopathic neuropathy, unspecified: Secondary | ICD-10-CM | POA: Diagnosis not present

## 2021-11-22 DIAGNOSIS — I10 Essential (primary) hypertension: Secondary | ICD-10-CM | POA: Diagnosis not present

## 2021-11-22 DIAGNOSIS — I2510 Atherosclerotic heart disease of native coronary artery without angina pectoris: Secondary | ICD-10-CM | POA: Diagnosis not present

## 2021-11-22 DIAGNOSIS — I4891 Unspecified atrial fibrillation: Secondary | ICD-10-CM | POA: Diagnosis not present

## 2021-11-22 DIAGNOSIS — E782 Mixed hyperlipidemia: Secondary | ICD-10-CM | POA: Diagnosis not present

## 2021-11-22 DIAGNOSIS — E559 Vitamin D deficiency, unspecified: Secondary | ICD-10-CM | POA: Diagnosis not present

## 2021-11-22 DIAGNOSIS — E039 Hypothyroidism, unspecified: Secondary | ICD-10-CM | POA: Diagnosis not present

## 2021-11-22 DIAGNOSIS — N5201 Erectile dysfunction due to arterial insufficiency: Secondary | ICD-10-CM | POA: Diagnosis not present

## 2021-11-22 DIAGNOSIS — D6869 Other thrombophilia: Secondary | ICD-10-CM | POA: Diagnosis not present

## 2021-11-22 DIAGNOSIS — H3530 Unspecified macular degeneration: Secondary | ICD-10-CM | POA: Diagnosis not present

## 2021-11-22 DIAGNOSIS — Z952 Presence of prosthetic heart valve: Secondary | ICD-10-CM | POA: Diagnosis not present

## 2021-11-22 DIAGNOSIS — N401 Enlarged prostate with lower urinary tract symptoms: Secondary | ICD-10-CM | POA: Diagnosis not present

## 2021-11-30 DIAGNOSIS — Z7901 Long term (current) use of anticoagulants: Secondary | ICD-10-CM | POA: Diagnosis not present

## 2021-12-12 DIAGNOSIS — H35372 Puckering of macula, left eye: Secondary | ICD-10-CM | POA: Diagnosis not present

## 2021-12-12 DIAGNOSIS — H43813 Vitreous degeneration, bilateral: Secondary | ICD-10-CM | POA: Diagnosis not present

## 2021-12-12 DIAGNOSIS — H353111 Nonexudative age-related macular degeneration, right eye, early dry stage: Secondary | ICD-10-CM | POA: Diagnosis not present

## 2021-12-12 DIAGNOSIS — H353222 Exudative age-related macular degeneration, left eye, with inactive choroidal neovascularization: Secondary | ICD-10-CM | POA: Diagnosis not present

## 2021-12-15 ENCOUNTER — Ambulatory Visit
Admission: EM | Admit: 2021-12-15 | Discharge: 2021-12-15 | Disposition: A | Discharge status: Home / Self Care | Payer: Medicare Other | Attending: Family Medicine | Admitting: Family Medicine

## 2021-12-15 DIAGNOSIS — R5381 Other malaise: Secondary | ICD-10-CM | POA: Diagnosis not present

## 2021-12-15 DIAGNOSIS — N390 Urinary tract infection, site not specified: Secondary | ICD-10-CM

## 2021-12-15 LAB — POCT URINALYSIS DIP (MANUAL ENTRY)
Bilirubin, UA: NEGATIVE
Glucose, UA: NEGATIVE mg/dL
Ketones, POC UA: NEGATIVE mg/dL
Leukocytes, UA: NEGATIVE
Nitrite, UA: NEGATIVE
Protein Ur, POC: NEGATIVE mg/dL
Spec Grav, UA: 1.015 (ref 1.010–1.025)
Urobilinogen, UA: 1 E.U./dL
pH, UA: 5.5 (ref 5.0–8.0)

## 2021-12-15 MED ORDER — LEVOFLOXACIN 500 MG PO TABS: 500.0000 mg | ORAL_TABLET | Freq: Every day | ORAL | 0 refills | Status: DC

## 2021-12-15 MED ORDER — CEPHALEXIN 250 MG PO CAPS: 250.0000 mg | ORAL_CAPSULE | Freq: Three times a day (TID) | ORAL | 0 refills | Status: AC

## 2021-12-15 NOTE — Discharge Instructions (Addendum)
Your urinalysis showed a trace amount of blood only ? ?Take cephalexin 250 mg--1 capsule 3 times daily for 7 days ? ?Let your urologist know this is been happening. ? ?Do not take nitrofurantoin while you are on this antibiotic ? ?A culture has been sent of the urine, and staff will call you if it looks like the antibiotic should be changed ?

## 2021-12-15 NOTE — ED Provider Notes (Signed)
?[facility] ? ? ? ?CSN: [id number] ?Arrival date & time: 12/15/21  0932 ? ? ?  ? ?History   ?Chief Complaint ?Chief Complaint  ?Patient presents with  ? Recurrent UTI  ? ? ?HPI ?Jason Santana is a 88 y.o. male.  ? ?HPI ?Here for "feeling bad" since early this morning.  He has a hard time being more specific than that with his symptoms.  He states he feels bad inside. ? ?He does have a history of recurrent UTI, and has been septic previously due to UTI.  At this clinic in March she received a prescription for Augmentin for up possible UTI.  He states that for the last 3 weeks he has been taking Macrobid as a preventative nightly per his urologist. ? ?Usually with his UTIs, he gets some urinary urgency.  He does not have that at present.  He denies any headache, cough, congestion, or sore throat.  Also he is not short of breath. ? ?Most recent creatinine in epic was 1.19 with an EGFR of 59 ? ?Past Medical History:  ?Diagnosis Date  ? Anticoagulation adequate 05/23/2019  ? Atrial fibrillation ([place])   ? CAD (coronary artery disease) of artery bypass graft 2000  ? 2000 with multiple bypasses   ? Chronic anticoagulation   ? Coumadin therapy for mechanical aortic valve. Postoperative atrial fibrillation.   ? Essential hypertension   ? H/O mechanical aortic valve replacement 2000  ? St Jude AVR  ? Hypothyroidism   ? Premature ventricular contractions   ? S/P ablation of atrial fibrillation 05/22/19 05/23/2019  ? Typical atrial flutter ([place]) 05/23/2019  ? ? ?Patient Active Problem List  ? Diagnosis Date Noted  ? AKI (acute kidney injury) ([place]) 02/28/2020  ? Symptomatic anemia 02/27/2020  ? Thoracic aortic aneurysm ([place]) 11/08/2019  ? Anticoagulation adequate 05/23/2019  ? Typical atrial flutter ([place]) 05/23/2019  ? S/P ablation of atrial fibrillation 05/22/19 05/23/2019  ? Atrial fibrillation ([place]) 05/22/2019  ? Chest pain 08/11/2018  ? Syncope 01/04/2018  ? Coronary artery disease involving native coronary artery of  native heart with unstable angina pectoris ([place]) 01/04/2018  ? GI bleed 01/03/2018  ? Macular degeneration of left eye 05/26/2017  ? Hypothyroidism 05/26/2017  ? Hyponatremia 05/25/2017  ? Sepsis ([place]) 05/25/2017  ? Muscle weakness 12/13/2014  ? Paroxysmal atrial flutter ([place]) 11/25/2013  ? Essential hypertension 11/25/2013  ? H/O mechanical aortic valve replacement 11/25/2013  ? CAD (coronary artery disease) of artery bypass graft 11/25/2013  ? Chronic anticoagulation 11/25/2013  ? Premature ventricular contractions 11/25/2013  ? ? ?Past Surgical History:  ?Procedure Laterality Date  ? AORTIC VALVE REPLACEMENT (AVR)/CORONARY ARTERY BYPASS GRAFTING (CABG)  2000  ? ATRIAL FIBRILLATION ABLATION N/A 05/22/2019  ? Procedure: ATRIAL FIBRILLATION ABLATION;  Surgeon: Constance Haw, MD;  Location: [facility];  Service: Cardiovascular;  Laterality: N/A;  ? BIOPSY  08/24/2021  ? Procedure: BIOPSY;  Surgeon: Milus Rylynn Schoneman, MD;  Location: [facility];  Service: Endoscopy;;  ? CARDIOVERSION N/A 02/24/2018  ? Procedure: CARDIOVERSION;  Surgeon: Fay Records, MD;  Location: [facility];  Service: Cardiovascular;  Laterality: N/A;  ? CARDIOVERSION N/A 08/15/2018  ? Procedure: CARDIOVERSION;  Surgeon: Thayer Headings, MD;  Location: [facility];  Service: Cardiovascular;  Laterality: N/A;  ? COLONOSCOPY WITH PROPOFOL N/A 01/08/2018  ? Procedure: COLONOSCOPY WITH PROPOFOL;  Surgeon: Arta Silence, MD;  Location: [facility];  Service: Gastroenterology;  Laterality: N/A;  ? CORONARY/GRAFT ANGIOGRAPHY N/A 08/14/2018  ?  Procedure: CORONARY/GRAFT ANGIOGRAPHY;  Surgeon: [name], MD;  Location: [facility];  Service: Cardiovascular;  Laterality: N/A;  ? ESOPHAGOGASTRODUODENOSCOPY (EGD) WITH PROPOFOL N/A 01/06/2018  ? Procedure: ESOPHAGOGASTRODUODENOSCOPY (EGD) WITH PROPOFOL;  Surgeon: Otis Brace, MD;  Location: [facility];  Service: Gastroenterology;  Laterality: N/A;  ?  ESOPHAGOGASTRODUODENOSCOPY (EGD) WITH PROPOFOL N/A 08/24/2021  ? Procedure: ESOPHAGOGASTRODUODENOSCOPY (EGD) WITH PROPOFOL;  Surgeon: Milus Abbygale Lapid, MD;  Location: [facility];  Service: Endoscopy;  Laterality: N/A;  ? FOOT SURGERY Right   ? x 2  ? ? ? ? ? ?Home Medications   ? ?Prior to Admission medications   ?Medication Sig Start Date End Date Taking? Authorizing Provider  ?cephALEXin (KEFLEX) 250 MG capsule Take 1 capsule (250 mg total) by mouth 3 (three) times daily for 7 days. 12/15/21 12/22/21 Yes Barrett Henle, MD  ?Cholecalciferol (VITAMIN D) 2000 units tablet Take 2,000 Units by mouth daily.    [provider]  ?diltiazem (CARDIZEM) 30 MG tablet TAKE 1 TABLET AS NEEDED FOR PALPITATIONS OR HEART RATE >100 10/05/19   Camnitz, Ocie Doyne, MD  ?diltiazem (TIADYLT ER) 120 MG 24 hr capsule Take 1 capsule (120 mg total) by mouth daily. 06/14/21   Camnitz, Ocie Doyne, MD  ?diphenhydramine-acetaminophen (TYLENOL PM) 25-500 MG TABS tablet Take 0.5 tablets by mouth at bedtime as needed (sleep).    [provider]  ?ferrous sulfate 325 (65 FE) MG tablet Take 325 mg by mouth daily with supper.    [provider]  ?furosemide (LASIX) 20 MG tablet Take 20 mg by mouth daily as needed for fluid.    [provider]  ?levothyroxine (SYNTHROID) 125 MCG tablet Take 125 mcg by mouth daily before breakfast.  11/21/18   [provider]  ?Multiple Vitamins-Minerals (PRESERVISION AREDS 2 PO) Take 1 capsule by mouth 2 (two) times daily.    [provider]  ?pantoprazole (PROTONIX) 40 MG tablet Take 40 mg by mouth 4 (four) times a week.    [provider]  ?Polyethyl Glycol-Propyl Glycol (SYSTANE OP) Place 1 drop into both eyes daily as needed (dry eyes).    [provider]  ?spironolactone (ALDACTONE) 25 MG tablet TAKE 1 TABLET (25 MG TOTAL) BY MOUTH DAILY. 09/28/21   Camnitz, Ocie Doyne, MD  ?tobramycin (TOBREX) 0.3 % ophthalmic solution Place 1 drop into  the left eye See admin instructions. Instill 1 drop into left eye 1 day before, the day of, and the day after eye injections administered at Dr's office    [provider]  ?triamcinolone cream (KENALOG) 0.1 % Apply 1 application topically 2 (two) times daily. 02/06/21   Wieters, Hallie C, PA-C  ?valsartan (DIOVAN) 320 MG tablet TAKE 1 TABLET BY MOUTH EVERY DAY ?Patient taking differently: Take 320 mg by mouth daily. 06/14/21   Camnitz, Ocie Doyne, MD  ?warfarin (COUMADIN) 5 MG tablet Take 5-7.5 mg by mouth See admin instructions. Per patient Sunday, Harrell Lark, & Saturday taking 7.5 mg = 1 & 1/2 tab. All other days taking 1  tab = 32m mg on  Mon, Wed, Friday    [provider]  ? ? ?Family History ?Family History  ?Problem Relation Age of Onset  ? Hypertension Mother   ? Heart attack Mother   ? Hypertension Father   ? Colon cancer Neg Hx   ? Esophageal cancer Neg Hx   ? Pancreatic cancer Neg Hx   ? Stomach cancer Neg Hx   ? ? ?Social History ?  Social History  ? ?Tobacco Use  ? Smoking status: Former  ?  Types: Cigarettes  ? Smokeless tobacco: Never  ? Tobacco comments:  ?  quit 57 years ago  ?Vaping Use  ? Vaping Use: Never used  ?Substance Use Topics  ? Alcohol use: No  ? Drug use: No  ? ? ? ?Allergies   ?Carvedilol, Lopid [gemfibrozil], Monopril [fosinopril], Vicodin [hydrocodone-acetaminophen], Phenergan [promethazine hcl], Zocor [simvastatin], Benicar [olmesartan], Codeine, and Prilosec [omeprazole] ? ? ?Review of Systems ?Review of Systems ? ? ?Physical Exam ?Triage Vital Signs ?ED Triage Vitals  ?Enc Vitals Group  ?   BP 12/15/21 0942 (!) 167/66  ?   Pulse Rate 12/15/21 0942 80  ?   Resp 12/15/21 0942 18  ?   Temp 12/15/21 0942 98.1 ?F (36.7 ?C)  ?   Temp Source 12/15/21 0942 Oral  ?   SpO2 12/15/21 0942 97 %  ?   Weight --   ?   Height --   ?   Head Circumference --   ?   Peak Flow --   ?   Pain Score 12/15/21 0943 0  ?   Pain Loc --   ?   Pain Edu? --   ?   Excl. in [place]? --   ? ?No data  found. ? ?Updated Vital Signs ?BP (!) 167/66 (BP Location: Left Arm)   Pulse 80   Temp 98.1 ?F (36.7 ?C) (Oral)   Resp 18   SpO2 97%  ? ?Visual Acuity ?Right Eye Distance:   ?Left Eye Distance:   ?Bilateral Distance:   ? ?Rig

## 2021-12-15 NOTE — ED Triage Notes (Signed)
Pt here states feels "rotten" concerned for UTI re-infection. Onset Monday. Hx of uti-related sepsis. States has been taking Macrobid for 3 weeks now given by [employer].  ?

## 2021-12-16 LAB — URINE CULTURE: Culture: <10000 — AB

## 2021-12-18 ENCOUNTER — Telehealth: Payer: Self-pay | Admitting: Cardiology

## 2021-12-18 DIAGNOSIS — N3020 Other chronic cystitis without hematuria: Secondary | ICD-10-CM | POA: Diagnosis not present

## 2021-12-18 NOTE — Telephone Encounter (Signed)
Medication prescribed at HTN clinic in 2021 and for some reason has been refilled by Dr. Curt Bears. ?Will forward to general cardiology to address, along with pharmD ?

## 2021-12-18 NOTE — Telephone Encounter (Signed)
Pt c/o medication issue: ? ?1. Name of Medication: spironolactone (ALDACTONE) 25 MG tablet ? ?2. How are you currently taking this medication (dosage and times per day)? As directed ? ?3. Are you having a reaction (difficulty breathing--STAT)? no ? ?4. What is your medication issue? Patient went to his Urologist and was told he had some breast swelling. The patient wanted to know if Dr. Curt Bears wanted to put the patient on another medication ?

## 2021-12-19 DIAGNOSIS — Z20822 Contact with and (suspected) exposure to covid-19: Secondary | ICD-10-CM | POA: Diagnosis not present

## 2021-12-19 MED ORDER — HYDRALAZINE HCL 25 MG PO TABS: 12.5000 mg | ORAL_TABLET | Freq: Three times a day (TID) | ORAL | 3 refills | Status: DC

## 2021-12-19 NOTE — Telephone Encounter (Signed)
Lets stop spironolactone. Patient has multiple intolerances so options are limited. Lets try adding hydralazine 12.'5mg'$  three times a day.  ?Spoke with patient who is in agreement with plan. F.u apt scheduled for 5/15. ?

## 2021-12-30 DIAGNOSIS — I959 Hypotension, unspecified: Secondary | ICD-10-CM | POA: Diagnosis not present

## 2021-12-30 DIAGNOSIS — R509 Fever, unspecified: Secondary | ICD-10-CM | POA: Diagnosis not present

## 2022-01-01 DIAGNOSIS — N3020 Other chronic cystitis without hematuria: Secondary | ICD-10-CM | POA: Diagnosis not present

## 2022-01-01 DIAGNOSIS — R3912 Poor urinary stream: Secondary | ICD-10-CM | POA: Diagnosis not present

## 2022-01-04 DIAGNOSIS — Z7901 Long term (current) use of anticoagulants: Secondary | ICD-10-CM | POA: Diagnosis not present

## 2022-01-09 DIAGNOSIS — R21 Rash and other nonspecific skin eruption: Secondary | ICD-10-CM | POA: Diagnosis not present

## 2022-01-09 DIAGNOSIS — R609 Edema, unspecified: Secondary | ICD-10-CM | POA: Diagnosis not present

## 2022-01-09 DIAGNOSIS — R0602 Shortness of breath: Secondary | ICD-10-CM | POA: Diagnosis not present

## 2022-01-11 NOTE — Progress Notes (Signed)
Patient ID: AMAREON PHUNG                 DOB: 19-Apr-1932                      MRN: [id number] ? ? ? ? ?HPI: ?Jason Santana is a 88 y.o. male referred by Dr. Curt Bears to HTN clinic. PMH is significant for HTN, CAD s/p CABG, afib s/p ablation in September 2020, stable aortic aneurysm on CT scan (2022), mechanical St Jude AVR (2000) on warfarin, and recurrent UTIs (3 ED visits since February). He had a GI bleed May 2019 requiring a pause in anticoagulation.  He has had 3 cardioversions. Last ECHO 08/2018 with LVEF 55-60%. Recently patient has complained about palpitations in his ears and fast heart rate. He could not tolerate carvedilol due to wheezing, therefore he was started on diltiazem. ? ?Pt last seen in person by PharmD in 2021, though multiple adjustments have been made to medications since then. Pt has history of fluctuating blood pressures. Last seen by MD on 07/11/21 where BP was controlled for goal <140/90 and no medication changes were made. On 12/18/21, pt reported gynecomastia on spironolactone. Stopped spironolactone in setting of gynecomastia and started hydralazine 12.5 mg TID.  ? ?Pt presents today for HTN follow-up. Shares that his urologist Dr. Louis Meckel started him on ciprofloxacin 500 mg BID for 4 weeks. He has been taking cipro for two weeks, and is having a lot of GI upset (waking up in the night to eat to improve tolerability) and fatigue. Cannot see urology note in chart, but pt wrote down that bacteria being treated was "aeruginosa." Also notes increase lower extremity edema in feet and ankles (10 lbs weight gain in one week) that correlates with stopping spironolactone and starting hydralazine, though pt wondering if it is a side effect of ciprofloxacin. PCP Dr. Koleen Nimrod increased furosemide to 40 mg (two tablets) once daily for ~1.5 weeks. Pt reports that swelling has improved since increasing furosemide, as he was able to get his shoes on and tie the laces today. Still has edema on exam today.  He is drinking ~70 oz of water/day to stay hydrated in the setting of his UTI and abx. Also reports exertional SOB that is worse than baseline. Pt has compression stockings at home- not currently using. He is elevating his feet while resting to improve swelling. Pt is taking BP once a day ~5-6AM before taking medications at breakfast. Endorses white coat HTN. Denies any dizziness, lightheadedness, blurred vision, HA, or chest pain. Of note, pt has had his INR checked since starting cipro.  ? ?Current HTN meds: valsartan 320 mg once daily, diltiazem ER '120mg'$  daily, furosemide 20 mg once daily, hydralazine 12.5 mg (1/2 25 mg tablets) TID (5AM, 12PM, 6PM).  ? ?Previously tried: fosinopril (transaminitis), olmesartan (abdominal cramping/IBS), metoprolol (bradycardia), hydrochlorothiazide (hyponatremia), carvedilol (wheezing), amlodipine (extensive LEE, gained 15.8 lbs), spironolactone (gynecomastia) ? ?BP goal: <140/90 ? ?Family History: Heart attack in his mother; Hypertension in his father and mother.  ? ?Social History: former smoker (quit 62 years ago), never alcohol  ? ?Exercise: Patient tries to stay active- riding stationary bike about 30 min every day  ? ?Home BP readings (taking prior to medications): Today 125/60, lowest ~121/51. Reports SBP consistently <140 since starting hydralazine.  ? ?Clinic BP: 140/56 mmHg, repeat 148/55 mmHg ? ?Wt Readings from Last 3 Encounters:  ?10/21/21 188 lb 7.9 oz (85.5 kg)  ?09/27/21 188 lb 6.4 oz (  85.5 kg)  ?08/04/21 184 lb (83.5 kg)  ? ?BP Readings from Last 3 Encounters:  ?12/15/21 (!) 167/66  ?11/08/21 (!) 169/65  ?10/21/21 (!) 177/65  ? ?Pulse Readings from Last 3 Encounters:  ?12/15/21 80  ?11/08/21 68  ?10/21/21 76  ? ? ?Renal function: ?CrCl cannot be calculated (Patient's most recent lab result is older than the maximum 21 days allowed.). ? ?Past Medical History:  ?Diagnosis Date  ? Anticoagulation adequate 05/23/2019  ? Atrial fibrillation ([place])   ? CAD (coronary  artery disease) of artery bypass graft 2000  ? 2000 with multiple bypasses   ? Chronic anticoagulation   ? Coumadin therapy for mechanical aortic valve. Postoperative atrial fibrillation.   ? Essential hypertension   ? H/O mechanical aortic valve replacement 2000  ? St Jude AVR  ? Hypothyroidism   ? Premature ventricular contractions   ? S/P ablation of atrial fibrillation 05/22/19 05/23/2019  ? Typical atrial flutter ([place]) 05/23/2019  ? ? ?Current Outpatient Medications on File Prior to Visit  ?Medication Sig Dispense Refill  ? Cholecalciferol (VITAMIN D) 2000 units tablet Take 2,000 Units by mouth daily.    ? diltiazem (CARDIZEM) 30 MG tablet TAKE 1 TABLET AS NEEDED FOR PALPITATIONS OR HEART RATE >100 30 tablet 6  ? diltiazem (TIADYLT ER) 120 MG 24 hr capsule Take 1 capsule (120 mg total) by mouth daily. 90 capsule 3  ? diphenhydramine-acetaminophen (TYLENOL PM) 25-500 MG TABS tablet Take 0.5 tablets by mouth at bedtime as needed (sleep).    ? ferrous sulfate 325 (65 FE) MG tablet Take 325 mg by mouth daily with supper.    ? furosemide (LASIX) 20 MG tablet Take 20 mg by mouth daily as needed for fluid.    ? hydrALAZINE (APRESOLINE) 25 MG tablet Take 0.5 tablets (12.5 mg total) by mouth 3 (three) times daily. 45 tablet 3  ? levothyroxine (SYNTHROID) 125 MCG tablet Take 125 mcg by mouth daily before breakfast.     ? Multiple Vitamins-Minerals (PRESERVISION AREDS 2 PO) Take 1 capsule by mouth 2 (two) times daily.    ? pantoprazole (PROTONIX) 40 MG tablet Take 40 mg by mouth 4 (four) times a week.    ? Polyethyl Glycol-Propyl Glycol (SYSTANE OP) Place 1 drop into both eyes daily as needed (dry eyes).    ? tobramycin (TOBREX) 0.3 % ophthalmic solution Place 1 drop into the left eye See admin instructions. Instill 1 drop into left eye 1 day before, the day of, and the day after eye injections administered at Dr's office    ? triamcinolone cream (KENALOG) 0.1 % Apply 1 application topically 2 (two) times daily. 30 g 0  ?  valsartan (DIOVAN) 320 MG tablet TAKE 1 TABLET BY MOUTH EVERY DAY (Patient taking differently: Take 320 mg by mouth daily.) 90 tablet 2  ? warfarin (COUMADIN) 5 MG tablet Take 5-7.5 mg by mouth See admin instructions. Per patient Sunday, Jason Santana, & Saturday taking 7.5 mg = 1 & 1/2 tab. All other days taking 1  tab = '5mg'$  mg on  Mon, Wed, Friday    ? ?No current facility-administered medications on file prior to visit.  ? ? ?Allergies  ?Allergen Reactions  ? Carvedilol Shortness Of Breath  ?  wheezing  ? Lopid [Gemfibrozil] Other (See Comments)  ?  transaminitis  ? Monopril [Fosinopril] Other (See Comments)  ?  transaminitis  ? Vicodin [Hydrocodone-Acetaminophen]   ?  Severe sensitivity  ? Phenergan [Promethazine Hcl] Other (See Comments)  ?  Severe somnolence.  ? Zocor [Simvastatin]   ?  Short memory loss.  ? Benicar [Olmesartan]   ?  Abdominal cramping and increased stools/ irritable bowel  ? Codeine Nausea And Vomiting  ? Prilosec [Omeprazole]   ?  dyspepsia  ? ? ?Assessment/Plan: ? ?1. Hypertension - Clinic BP slightly uncontrolled at 148/55 mmHg for goal <140/90, though home readings prior to taking medications are well controlled at <130/80. Suspect that stopping spironolactone and starting hydralazine contributed to fluid status changes, LEE, and weight gain. Fluid retention is not a common AE with ciprofloxacin. Stopped hydralazine 12.5 mg TID. Instructed pt to closely monitor swelling and continue higher dose of furosemide 40 mg once daily until given further directions from PCP, who he is seeing tomorrow. Continue valsartan 320 mg once daily and diltiazem ER '120mg'$  daily. Pt agreeable to wearing compression stockings during the day; will continue to elevate feet at rest. Pt aware to contact office with BP elevated >>140/90 or major changes in fluid status. Recommended limiting fluid intake to ~64 oz/day while increasing diuresis. Since patient has several intolerances and treatment options are very  limited, will follow-up in clinic in ~ 1 month to validate home BP cuff and review home readings with recent medication changes. If further BP control needed, eplerenone would have similar effect as spironolactone

## 2022-01-15 ENCOUNTER — Ambulatory Visit (INDEPENDENT_AMBULATORY_CARE_PROVIDER_SITE_OTHER): Payer: Medicare Other | Admitting: Pharmacist

## 2022-01-15 VITALS — BP 140/56

## 2022-01-15 DIAGNOSIS — I10 Essential (primary) hypertension: Secondary | ICD-10-CM

## 2022-01-15 NOTE — Patient Instructions (Addendum)
Thank you for coming in today! It was great to see you.  ? ?Stop hydralazine 12.5 mg (1/2 tablet) three times day. If the swelling in your ankles has not improved in about 1 week, let us know.  ?Continue taking furosemide 40 mg (2 tablets) once a day. Let Dr. Koleen Nimrod know about stopping hydralazine at your appointment tomorrow.  ?Continue taking all other medications as prescribed.  ?Start wearing compression stockings and keep your feet elevated when resting.  ? ?Please take your blood pressure at home about 1 hour after taking your medications. Your blood pressure goal is <140/90. Bring your home blood pressure cuff and logs to our next appointment.  ? ? ? ? ?

## 2022-01-16 DIAGNOSIS — R609 Edema, unspecified: Secondary | ICD-10-CM | POA: Diagnosis not present

## 2022-02-05 DIAGNOSIS — Z7901 Long term (current) use of anticoagulants: Secondary | ICD-10-CM | POA: Diagnosis not present

## 2022-02-06 DIAGNOSIS — R3912 Poor urinary stream: Secondary | ICD-10-CM | POA: Diagnosis not present

## 2022-02-06 DIAGNOSIS — N3020 Other chronic cystitis without hematuria: Secondary | ICD-10-CM | POA: Diagnosis not present

## 2022-02-12 ENCOUNTER — Ambulatory Visit (INDEPENDENT_AMBULATORY_CARE_PROVIDER_SITE_OTHER): Payer: Medicare Other | Admitting: Pharmacist

## 2022-02-12 ENCOUNTER — Encounter: Payer: Self-pay | Admitting: Pharmacist

## 2022-02-12 ENCOUNTER — Ambulatory Visit (INDEPENDENT_AMBULATORY_CARE_PROVIDER_SITE_OTHER): Payer: Medicare Other | Admitting: Cardiology

## 2022-02-12 ENCOUNTER — Encounter: Payer: Self-pay | Admitting: Cardiology

## 2022-02-12 VITALS — BP 138/54 | HR 65

## 2022-02-12 VITALS — BP 154/52 | HR 63 | Ht 70.0 in | Wt 179.8 lb

## 2022-02-12 DIAGNOSIS — D6869 Other thrombophilia: Secondary | ICD-10-CM

## 2022-02-12 DIAGNOSIS — I4819 Other persistent atrial fibrillation: Secondary | ICD-10-CM | POA: Diagnosis not present

## 2022-02-12 DIAGNOSIS — I10 Essential (primary) hypertension: Secondary | ICD-10-CM

## 2022-02-12 NOTE — Progress Notes (Signed)
Patient ID: Jason Santana                 DOB: 06/11/1932                      MRN: [id number]     HPI: Jason Santana is a 88 y.o. male referred by Dr. Curt Bears to HTN clinic. PMH is significant for HTN, CAD s/p CABG, afib s/p ablation in September 2020, stable aortic aneurysm on CT scan (2022), mechanical St Jude AVR (2000) on warfarin, and recurrent UTIs (3 ED visits since February). He had a GI bleed May 2019 requiring a pause in anticoagulation.  He has had 3 cardioversions. Last ECHO 08/2018 with LVEF 55-60%. Recently patient has complained of palpitations in his ears and fast heart rate. He could not tolerate carvedilol due to wheezing, therefore he was started on diltiazem.  Pt has history of fluctuating blood pressures. Last seen by MD on 07/11/21 where BP was controlled for goal <140/90 and no medication changes were made. On 12/18/21, pt reported gynecomastia on spironolactone. Stopped spironolactone in setting of gynecomastia and started hydralazine 12.5 mg TID. At last visit with PharmD on 01/15/22, BP elevated above goal and pt noted increased lower extremity edema in feet and ankles (10 lbs weight gain in one week) that correlated with stopping spironolactone and starting hydralazine. During that visit, hydralazine was stopped due to swelling and patient was told to bring in home BP monitor at this visit before making further medication changes. Pt's Scr taken at PCP since last visit was elevated from baseline.  Pt presents today in good spirits. Reports much improvement in swelling since Lasix was initially increased by PCP to 60 mg by PCP and is not on 40 mg daily. Pt reported taking medications as instructed, stopping hydralazine after the last visit with PharmD. Pt reported timeline of swelling improvement did not correlate with discontinuation of hydralazine. Pt denies any symptoms today including swelling, shortness of breath, palpitations, chest pain, headache, or dizziness. Pt's home BP  readings were largely at goal. Pt reports taking home BP readings before medications because pt reports being too busy afterwards. BP is checked before morning coffee and while resting. Pt brought in home BP device and demonstrated proper technique in clinic accept for speaking while measuring BP. Pt watched BP rise on the meter and commented on the number as it rose. Nervousness and speaking may have contributed to the higher reading in clinic because manual check was at goal x 2. Pt reports exercise at goal and a relatively healthy diet.   Current HTN meds: valsartan 320 mg once daily, diltiazem ER '120mg'$  daily, furosemide 40 mg once daily  Previously tried: fosinopril (transaminitis), olmesartan (abdominal cramping/IBS), metoprolol (bradycardia), hydrochlorothiazide (hyponatremia), carvedilol (wheezing), amlodipine (extensive LEE, gained 15.8 lbs), spironolactone (gynecomastia), hydralazine (swelling)  BP goal: <140/90 mmHg  Family History: Heart attack in his mother; Hypertension in his father and mother.   Social History: former smoker (quit 62 years ago), never alcohol   Diet: Reports eating a big breakfast (generally cereal and fruit), a large lunch (generally avoids fried foods), and rarely eats in the evenings.  Exercise: Patient tries to stay active- riding stationary bike about 20 min 5 days a week  Home BP readings (taking prior to medications): Today 127/60 HR 63,  6/11 131/61 HR 63, 6/10 141/65 HR 63, 6/9 140/67 HR 59, 6/8 139/65 HR 58, 6/6 130/62 HR 63  Clinic BP:   Wt  Readings from Last 3 Encounters:  10/21/21 188 lb 7.9 oz (85.5 kg)  09/27/21 188 lb 6.4 oz (85.5 kg)  08/04/21 184 lb (83.5 kg)   BP Readings from Last 3 Encounters:  01/15/22 (!) 140/56  12/15/21 (!) 167/66  11/08/21 (!) 169/65   Pulse Readings from Last 3 Encounters:  12/15/21 80  11/08/21 68  10/21/21 76    Renal function: CrCl cannot be calculated (Patient's most recent lab result is older than  the maximum 21 days allowed.).  Past Medical History:  Diagnosis Date   Anticoagulation adequate 05/23/2019   Atrial fibrillation ([place])    CAD (coronary artery disease) of artery bypass graft 2000   2000 with multiple bypasses    Chronic anticoagulation    Coumadin therapy for mechanical aortic valve. Postoperative atrial fibrillation.    Essential hypertension    H/O mechanical aortic valve replacement 2000   St Jude AVR   Hypothyroidism    Premature ventricular contractions    S/P ablation of atrial fibrillation 05/22/19 05/23/2019   Typical atrial flutter ([place]) 05/23/2019    Current Outpatient Medications on File Prior to Visit  Medication Sig Dispense Refill   Cholecalciferol (VITAMIN D) 2000 units tablet Take 2,000 Units by mouth daily.     ciprofloxacin (CIPRO) 500 MG/5ML (10%) suspension Take 500 mg by mouth 2 (two) times daily.     diltiazem (CARDIZEM) 30 MG tablet TAKE 1 TABLET AS NEEDED FOR PALPITATIONS OR HEART RATE >100 30 tablet 6   diltiazem (TIADYLT ER) 120 MG 24 hr capsule Take 1 capsule (120 mg total) by mouth daily. 90 capsule 3   diphenhydramine-acetaminophen (TYLENOL PM) 25-500 MG TABS tablet Take 0.5 tablets by mouth at bedtime as needed (sleep).     ferrous sulfate 325 (65 FE) MG tablet Take 325 mg by mouth daily with supper.     furosemide (LASIX) 20 MG tablet Take 20 mg by mouth daily as needed for fluid.     hydrALAZINE (APRESOLINE) 25 MG tablet Take 0.5 tablets (12.5 mg total) by mouth 3 (three) times daily. 45 tablet 3   levothyroxine (SYNTHROID) 125 MCG tablet Take 125 mcg by mouth daily before breakfast.      Multiple Vitamins-Minerals (PRESERVISION AREDS 2 PO) Take 1 capsule by mouth 2 (two) times daily.     pantoprazole (PROTONIX) 40 MG tablet Take 40 mg by mouth 4 (four) times a week.     Polyethyl Glycol-Propyl Glycol (SYSTANE OP) Place 1 drop into both eyes daily as needed (dry eyes).     tamsulosin (FLOMAX) 0.4 MG CAPS capsule Take 0.8 mg by mouth  daily.     tobramycin (TOBREX) 0.3 % ophthalmic solution Place 1 drop into the left eye See admin instructions. Instill 1 drop into left eye 1 day before, the day of, and the day after eye injections administered at Dr's office     triamcinolone cream (KENALOG) 0.1 % Apply 1 application topically 2 (two) times daily. 30 g 0   valsartan (DIOVAN) 320 MG tablet TAKE 1 TABLET BY MOUTH EVERY DAY (Patient taking differently: Take 320 mg by mouth daily.) 90 tablet 2   warfarin (COUMADIN) 5 MG tablet Take 5-7.5 mg by mouth See admin instructions. Per patient Sunday, Harrell Lark, & Saturday taking 7.5 mg = 1 & 1/2 tab. All other days taking 1  tab = '5mg'$  mg on  Mon, Wed, Friday     No current facility-administered medications on file prior to visit.    Allergies  Allergen Reactions   Carvedilol Shortness Of Breath    wheezing   Lopid [Gemfibrozil] Other (See Comments)    transaminitis   Monopril [Fosinopril] Other (See Comments)    transaminitis   Vicodin [Hydrocodone-Acetaminophen]     Severe sensitivity   Phenergan [Promethazine Hcl] Other (See Comments)    Severe somnolence.   Zocor [Simvastatin]     Short memory loss.   Benicar [Olmesartan]     Abdominal cramping and increased stools/ irritable bowel   Codeine Nausea And Vomiting   Prilosec [Omeprazole]     dyspepsia    Assessment/Plan:  1. Hypertension - Clinic BP of 138/54 mmHg is slightly above goal of <140/90 mmHg. Pt's BP cuff at home consistently showed Bps at goal and pt is asymptomatic. Will continue valsartan 320 mg once daily, diltiazem ER '120mg'$  daily, and furosemide 40 mg once daily. Follow up in clinic in 3 months for BP recheck.  Patient seen by PGY1 pharmacy resident with me in room.  Agree with plan.  Karren Cobble, PharmD, BCACP, [place], [place] [address], [place], [place] [address] Phone: [phone]; Fax: [phone] 02/12/2022 9:18 AM

## 2022-02-12 NOTE — Patient Instructions (Addendum)
Blood pressure at goal of <140/90  Continue current medications Follow up 04/17/22 at 8:30 am Continue monitoring BP at home

## 2022-02-12 NOTE — Patient Instructions (Signed)
Medication Instructions:  Your physician recommends that you continue on your current medications as directed. Please refer to the Current Medication list given to you today.  *If you need a refill on your cardiac medications before your next appointment, please call your pharmacy*   Lab Work: None ordered   Testing/Procedures: None ordered   Follow-Up: At [facility], you and your health needs are our priority.  As part of our continuing mission to provide you with exceptional heart care, we have created designated Provider Care Teams.  These Care Teams include your primary Cardiologist (physician) and Advanced Practice Providers (APPs -  Physician Assistants and Nurse Practitioners) who all work together to provide you with the care you need, when you need it.  Your next appointment:   1 year(s)  The format for your next appointment:   In Person  Provider:   You will follow up in the Atrial Fibrillation Clinic located at [facility]. Your provider will be: Clint R. Fenton, PA-C\    Thank you for choosing [facility]!!   Rawn Quiroa, RN [phone]  Other Instructions   Important Information About Sugar           

## 2022-02-12 NOTE — Progress Notes (Signed)
Electrophysiology Office Note   Date:  02/12/2022   ID:  Jason Santana, DOB July 05, 1932, MRN [id number]  PCP:  [name], MD  Cardiologist:  Tamala Julian Primary Electrophysiologist:  Brennley Curtice Meredith Leeds, MD    No chief complaint on file.     History of Present Illness: Jason Santana is a 88 y.o. male who is being seen today for the evaluation of atrial fibrillation at the request of [name], MD. Presenting today for electrophysiology evaluation.    He has a history of mechanical aortic valve replacement in 2000, atrial fibrillation, atrial flutter, coronary artery disease.  He is status post atrial fibrillation ablation 05/22/2019.  He is fortunately not had any further episodes of atrial fibrillation.  Today, denies symptoms of palpitations, chest pain, shortness of breath, orthopnea, PND, lower extremity edema, claudication, dizziness, presyncope, syncope, bleeding, or neurologic sequela. The patient is tolerating medications without difficulties.  He has not had any further episodes of atrial fibrillation.  He was in the emergency room multiple times with issues of UTIs.  He also developed significant lower extremity edema during this therapy.  He has lost a significant amount of weight, with therapy per his primary physician.  He feels well and is able to do all of his daily activities.    Past Medical History:  Diagnosis Date   Anticoagulation adequate 05/23/2019   Atrial fibrillation ([place])    CAD (coronary artery disease) of artery bypass graft 2000   2000 with multiple bypasses    Chronic anticoagulation    Coumadin therapy for mechanical aortic valve. Postoperative atrial fibrillation.    Essential hypertension    H/O mechanical aortic valve replacement 2000   St Jude AVR   Hypothyroidism    Premature ventricular contractions    S/P ablation of atrial fibrillation 05/22/19 05/23/2019   Typical atrial flutter ([place]) 05/23/2019   Past Surgical History:  Procedure  Laterality Date   AORTIC VALVE REPLACEMENT (AVR)/CORONARY ARTERY BYPASS GRAFTING (CABG)  2000   ATRIAL FIBRILLATION ABLATION N/A 05/22/2019   Procedure: ATRIAL FIBRILLATION ABLATION;  Surgeon: Constance Haw, MD;  Location: [facility];  Service: Cardiovascular;  Laterality: N/A;   BIOPSY  08/24/2021   Procedure: BIOPSY;  Surgeon: Milus Banister, MD;  Location: [facility];  Service: Endoscopy;;   CARDIOVERSION N/A 02/24/2018   Procedure: CARDIOVERSION;  Surgeon: Fay Records, MD;  Location: [facility] ENDOSCOPY;  Service: Cardiovascular;  Laterality: N/A;   CARDIOVERSION N/A 08/15/2018   Procedure: CARDIOVERSION;  Surgeon: Thayer Headings, MD;  Location: [facility] ENDOSCOPY;  Service: Cardiovascular;  Laterality: N/A;   COLONOSCOPY WITH PROPOFOL N/A 01/08/2018   Procedure: COLONOSCOPY WITH PROPOFOL;  Surgeon: Arta Silence, MD;  Location: [facility] ENDOSCOPY;  Service: Gastroenterology;  Laterality: N/A;   CORONARY/GRAFT ANGIOGRAPHY N/A 08/14/2018   Procedure: CORONARY/GRAFT ANGIOGRAPHY;  Surgeon: [name], MD;  Location: [facility];  Service: Cardiovascular;  Laterality: N/A;   ESOPHAGOGASTRODUODENOSCOPY (EGD) WITH PROPOFOL N/A 01/06/2018   Procedure: ESOPHAGOGASTRODUODENOSCOPY (EGD) WITH PROPOFOL;  Surgeon: Otis Brace, MD;  Location: [facility] ENDOSCOPY;  Service: Gastroenterology;  Laterality: N/A;   ESOPHAGOGASTRODUODENOSCOPY (EGD) WITH PROPOFOL N/A 08/24/2021   Procedure: ESOPHAGOGASTRODUODENOSCOPY (EGD) WITH PROPOFOL;  Surgeon: Milus Banister, MD;  Location: [facility] ENDOSCOPY;  Service: Endoscopy;  Laterality: N/A;   FOOT SURGERY Right    x 2     Current Outpatient Medications  Medication Sig Dispense Refill   Cholecalciferol (VITAMIN D) 2000 units tablet Take 2,000 Units by mouth daily.  ciprofloxacin (CIPRO) 500 MG/5ML (10%) suspension Take 500 mg by mouth 2 (two) times daily.     diltiazem (CARDIZEM) 30 MG tablet TAKE 1 TABLET AS NEEDED FOR PALPITATIONS OR HEART RATE  >100 30 tablet 6   diltiazem (TIADYLT ER) 120 MG 24 hr capsule Take 1 capsule (120 mg total) by mouth daily. 90 capsule 3   diphenhydramine-acetaminophen (TYLENOL PM) 25-500 MG TABS tablet Take 0.5 tablets by mouth at bedtime as needed (sleep).     ferrous sulfate 325 (65 FE) MG tablet Take 325 mg by mouth daily with supper.     furosemide (LASIX) 20 MG tablet Take 20 mg by mouth daily as needed for fluid.     hydrALAZINE (APRESOLINE) 25 MG tablet Take 0.5 tablets (12.5 mg total) by mouth 3 (three) times daily. 45 tablet 3   levothyroxine (SYNTHROID) 125 MCG tablet Take 125 mcg by mouth daily before breakfast.      Multiple Vitamins-Minerals (PRESERVISION AREDS 2 PO) Take 1 capsule by mouth 2 (two) times daily.     pantoprazole (PROTONIX) 40 MG tablet Take 40 mg by mouth 4 (four) times a week.     Polyethyl Glycol-Propyl Glycol (SYSTANE OP) Place 1 drop into both eyes daily as needed (dry eyes).     tamsulosin (FLOMAX) 0.4 MG CAPS capsule Take 0.8 mg by mouth daily.     tobramycin (TOBREX) 0.3 % ophthalmic solution Place 1 drop into the left eye See admin instructions. Instill 1 drop into left eye 1 day before, the day of, and the day after eye injections administered at Dr's office     triamcinolone cream (KENALOG) 0.1 % Apply 1 application topically 2 (two) times daily. 30 g 0   valsartan (DIOVAN) 320 MG tablet TAKE 1 TABLET BY MOUTH EVERY DAY (Patient taking differently: Take 320 mg by mouth daily.) 90 tablet 2   warfarin (COUMADIN) 5 MG tablet Take 5-7.5 mg by mouth See admin instructions. Per patient Sunday, Jason Santana, & Saturday taking 7.5 mg = 1 & 1/2 tab. All other days taking 1  tab = '5mg'$  mg on  Mon, Wed, Friday     No current facility-administered medications for this visit.    Allergies:   Carvedilol, Lopid [gemfibrozil], Monopril [fosinopril], Vicodin [hydrocodone-acetaminophen], Phenergan [promethazine hcl], Zocor [simvastatin], Benicar [olmesartan], Codeine, and Prilosec  [omeprazole]   Social History:  The patient  reports that he has quit smoking. His smoking use included cigarettes. He has never used smokeless tobacco. He reports that he does not drink alcohol and does not use drugs.   Family History:  The patient's family history includes Heart attack in his mother; Hypertension in his father and mother.   ROS:  Please see the history of present illness.   Otherwise, review of systems is positive for none.   All other systems are reviewed and negative.   PHYSICAL EXAM: VS:  BP (!) 154/52 (BP Location: Left Arm, Patient Position: Sitting, Cuff Size: Normal)   Pulse 63   Ht '5\' 10"'$  (1.778 m)   Wt 179 lb 12.8 oz (81.6 kg)   BMI 25.80 kg/m  , BMI Body mass index is 25.8 kg/m. GEN: Well nourished, well developed, in no acute distress  HEENT: normal  Neck: no JVD, carotid bruits, or masses Cardiac: RRR; no murmurs, rubs, or gallops,no edema  Respiratory:  clear to auscultation bilaterally, normal work of breathing GI: soft, nontender, nondistended, + BS MS: no deformity or atrophy  Skin: warm and dry Neuro:  Strength and sensation are intact Psych: euthymic mood, full affect  EKG:  EKG is ordered today. Personal review of the ekg ordered shows sinus rhythm   Recent Labs: 06/12/2021: ALT 25; BUN 19; Creatinine, Ser 1.19; Hemoglobin 11.8; Magnesium 1.9; Platelets 218; Potassium 4.5; Sodium 134; TSH 0.757    Lipid Panel     Component Value Date/Time   CHOL 130 04/15/2019 0905   TRIG 157 (H) 04/15/2019 0905   HDL 30 (L) 04/15/2019 0905   CHOLHDL 4.3 04/15/2019 0905   CHOLHDL 7.7 08/12/2018 0225   VLDL 48 (H) 08/12/2018 0225   LDLCALC 69 04/15/2019 0905     Wt Readings from Last 3 Encounters:  02/12/22 179 lb 12.8 oz (81.6 kg)  10/21/21 188 lb 7.9 oz (85.5 kg)  09/27/21 188 lb 6.4 oz (85.5 kg)      Other studies Reviewed: Additional studies/ records that were reviewed today include: TTE 08/12/18  Review of the above records today  demonstrates:  - Left ventricle: The cavity size was normal. There was mild   concentric hypertrophy. Systolic function was normal. The   estimated ejection fraction was in the range of 55% to 60%. Wall   motion was normal; there were no regional wall motion   abnormalities. - Ventricular septum: Septal motion showed paradox. These changes   are consistent with a post-thoracotomy state. - Aortic valve: A mechanical prosthesis was present and functioning   normally. - Mitral valve: Calcified annulus. There was mild regurgitation. - Left atrium: The atrium was mildly dilated. - Right ventricle: Systolic function was mildly to moderately   reduced. - Right atrium: The atrium was mildly dilated.  LHC 08/14/18 Mid RCA lesion is 100% stenosed. Ost 2nd Mrg lesion is 100% stenosed. Prox LAD to Mid LAD lesion is 50% stenosed. SVG. The graft exhibits mild .  1.  Severe two-vessel coronary artery disease with total occlusion of the OM 2 and the mid RCA, nonobstructive LAD stenosis 2.  Status post remote aortocoronary bypass surgery with continued patency of the LIMA to LAD, saphenous vein graft OM 2, and saphenous vein graft to PDA  ASSESSMENT AND PLAN:  1.  Persistent atrial fibrillation: Currently on warfarin.  Status post ablation 05/22/2019.  CHA2DS2-VASc of 4.  No further episodes.  No changes.  2.  Mechanical aortic valve: Stable on most recent echo.  Plan per primary cardiology.  3.  Coronary artery disease: No current chest pain  4.  Hypertension: Followed in pharmacy hypertension clinic  5.  Aortic aneurysm: Found on preablation CT.  Repeat CT without issue.  6.  Secondary hypercoagulable state: Currently on warfarin for atrial fibrillation as above  Current medicines are reviewed at length with the patient today.   The patient does not have concerns regarding his medicines.  The following changes were made today: none  Labs/ tests ordered today include:  Orders Placed This  Encounter  Procedures   EKG 12-Lead     Disposition:   [facility] A-fib clinic 12 months  Signed, Jenevieve Kirschbaum Meredith Leeds, MD  02/12/2022 9:40 AM     [address] [place] [id number] [phone] (office) [phone] (fax)

## 2022-02-13 ENCOUNTER — Telehealth: Payer: Self-pay

## 2022-02-13 ENCOUNTER — Other Ambulatory Visit: Payer: Self-pay

## 2022-02-13 DIAGNOSIS — K7460 Unspecified cirrhosis of liver: Secondary | ICD-10-CM

## 2022-02-13 DIAGNOSIS — I8500 Esophageal varices without bleeding: Secondary | ICD-10-CM

## 2022-02-13 NOTE — Telephone Encounter (Signed)
Patient aware that he has been scheduled for a RUQ abdominal ultrasound at [facility] on 03-08-22 at [facility].  Patient advised to arrive at 8:30 am and NPO 6 hours prior.  Patient also instructed that he will need lab work as well.   Patient will be seen by Dr Ardis Hughs on 03-21-22 at 11:10am.  Patient agreed to plan and verbalized understanding.  No further questions.

## 2022-03-07 DIAGNOSIS — Z7901 Long term (current) use of anticoagulants: Secondary | ICD-10-CM | POA: Diagnosis not present

## 2022-03-08 ENCOUNTER — Other Ambulatory Visit (INDEPENDENT_AMBULATORY_CARE_PROVIDER_SITE_OTHER): Payer: Medicare Other

## 2022-03-08 ENCOUNTER — Ambulatory Visit (HOSPITAL_COMMUNITY)
Admission: RE | Admit: 2022-03-08 | Discharge: 2022-03-08 | Disposition: A | Discharge status: Home / Self Care | Payer: Medicare Other | Source: Ambulatory Visit | Attending: Gastroenterology | Admitting: Gastroenterology

## 2022-03-08 DIAGNOSIS — I8500 Esophageal varices without bleeding: Secondary | ICD-10-CM

## 2022-03-08 DIAGNOSIS — Z9049 Acquired absence of other specified parts of digestive tract: Secondary | ICD-10-CM | POA: Diagnosis not present

## 2022-03-08 DIAGNOSIS — K7460 Unspecified cirrhosis of liver: Secondary | ICD-10-CM | POA: Insufficient documentation

## 2022-03-08 LAB — COMPREHENSIVE METABOLIC PANEL
ALT: 22 U/L (ref 0–53)
AST: 44 U/L — ABNORMAL HIGH (ref 0–37)
Albumin: 3.9 g/dL (ref 3.5–5.2)
Alkaline Phosphatase: 89 U/L (ref 39–117)
BUN: 20 mg/dL (ref 6–23)
CO2: 25 mEq/L (ref 19–32)
Calcium: 9.7 mg/dL (ref 8.4–10.5)
Chloride: 100 mEq/L (ref 96–112)
Creatinine, Ser: 1.29 mg/dL (ref 0.40–1.50)
GFR: 49.81 mL/min — ABNORMAL LOW (ref >60.00)
Glucose, Bld: 115 mg/dL — ABNORMAL HIGH (ref 70–99)
Potassium: 4.2 mEq/L (ref 3.5–5.1)
Sodium: 132 mEq/L — ABNORMAL LOW (ref 135–145)
Total Bilirubin: 1.4 mg/dL — ABNORMAL HIGH (ref 0.2–1.2)
Total Protein: 7.6 g/dL (ref 6.0–8.3)

## 2022-03-08 LAB — CBC
HCT: 35.5 % — ABNORMAL LOW (ref 39.0–52.0)
Hemoglobin: 11.8 g/dL — ABNORMAL LOW (ref 13.0–17.0)
MCHC: 33.2 g/dL (ref 30.0–36.0)
MCV: 100.3 fl — ABNORMAL HIGH (ref 78.0–100.0)
Platelets: 184 10*3/uL (ref 150.0–400.0)
RBC: 3.53 Mil/uL — ABNORMAL LOW (ref 4.22–5.81)
RDW: 13.6 % (ref 11.5–15.5)
WBC: 4.3 10*3/uL (ref 4.0–10.5)

## 2022-03-11 ENCOUNTER — Other Ambulatory Visit: Payer: Self-pay | Admitting: Cardiology

## 2022-03-14 LAB — MITOCHONDRIAL ANTIBODIES: Mitochondrial M2 Ab, IgG: 30 U — ABNORMAL HIGH (ref <=20.0)

## 2022-03-16 ENCOUNTER — Other Ambulatory Visit: Payer: Self-pay | Admitting: Interventional Cardiology

## 2022-03-21 ENCOUNTER — Encounter: Payer: Self-pay | Admitting: Gastroenterology

## 2022-03-21 ENCOUNTER — Ambulatory Visit (INDEPENDENT_AMBULATORY_CARE_PROVIDER_SITE_OTHER): Payer: Medicare Other | Admitting: Gastroenterology

## 2022-03-21 VITALS — BP 160/60 | HR 68 | Ht 68.5 in | Wt 179.0 lb

## 2022-03-21 DIAGNOSIS — K7460 Unspecified cirrhosis of liver: Secondary | ICD-10-CM

## 2022-03-21 NOTE — Patient Instructions (Signed)
If you are age 86 or older, your body mass index should be between 23-30. Your Body mass index is 26.82 kg/m. If this is out of the aforementioned range listed, please consider follow up with your Primary Care Provider. ________________________________________________________  The [facility] providers would like to encourage you to use [facility] to communicate with providers for non-urgent requests or questions.  Due to long hold times on the telephone, sending your provider a message by [facility] may be a faster and more efficient way to get a response.  Please allow 48 business hours for a response.  Please remember that this is for non-urgent requests.  _______________________________________________________  Your provider has requested that you go to the basement level for lab work in 6 months (January 2024).   Due to recent changes in healthcare laws, you may see the results of your imaging and laboratory studies on MyChart before your provider has had a chance to review them.  We understand that in some cases there may be results that are confusing or concerning to you. Not all laboratory results come back in the same time frame and the provider may be waiting for multiple results in order to interpret others.  Please give [place] 48 hours in order for your provider to thoroughly review all the results before contacting the office for clarification of your results.   You will also need a RUQ ultrasound in January 2024.  We will contact you to schedule this appointment.  You will need a follow up appointment in 1 year (July 2024).  We will contact you to schedule this appointment.  Thank you for entrusting me with your care and choosing [facility].  Dr Ardis Hughs

## 2022-03-21 NOTE — Progress Notes (Signed)
Review of pertinent gastrointestinal problems: 1.  Cirrhosis (cryptogenic, never alcohol drinker, negative general work-up below): Normal platelets AST slightly elevated at 52, normal bilirubin) EGD December 2022 Dr. Ardis Hughs mild nonspecific gastritis.  No esophageal varices, no gastric varices, no portal gastropathy.  Biopsies were negative for H. Pylori Laboratory work-up late 2022 TTG negative, IgA normal, anti-smooth muscle antibody negative, antimitochondrial antibodies positive at 37, alpha-1 antitrypsin level normal, alpha-fetoprotein level normal, ANA negative, hepatitis C antibody negative, hepatitis B surface antigen nonreactive, hepatitis B surface antibody nonreactive, hepatitis A total antibody negative, July 2023 AMA recheck, still positive but lower at 30. Alpha-fetoprotein December 2022 normal Abdominal ultrasound November 2022 showed cirrhosis without evidence of discrete tumors.  Ultrasound 03/2022 cirrhosis without mass lesions Meld score: Cannot calculate due to Coumadin use Hepatitis A/B immunization series started 2022 by primary care physician 2.  Routine risk for colon cancer.  Colonoscopy Dr. Arta Silence May 2019 while hospitalized for bleeding showed diverticulosis, hemorrhoids, no polyps.   HPI: This is a very pleasant 88 year old man  I last saw him here in the office about 6 months ago.   He had an ultrasound recently and it showed cirrhosis without mass lesions.  Blood work last week showed total bilirubin 1.4, AST 44, antimitochondrial antibodies were rechecked and they were still elevated but a bit lower at 30, he has normal platelets, hemoglobin 11.8 with an MCV of 100.  He has his usual trouble with lower extremity edema right greater than left.  This has been a problem of his for about 18 years since he had pins put in his ankle.  It is not worse lately.  He has no ascites, no obvious encephalopathic events and no overt GI bleeding.  ROS: complete GI ROS as  described in HPI, all other review negative.  Constitutional:  No unintentional weight loss   Past Medical History:  Diagnosis Date   Anticoagulation adequate 05/23/2019   Atrial fibrillation (HCC)    CAD (coronary artery disease) of artery bypass graft 2000   2000 with multiple bypasses    Chronic anticoagulation    Coumadin therapy for mechanical aortic valve. Postoperative atrial fibrillation.    Essential hypertension    H/O mechanical aortic valve replacement 2000   St Jude AVR   Hypothyroidism    Premature ventricular contractions    S/P ablation of atrial fibrillation 05/22/19 05/23/2019   Typical atrial flutter ([place]) 05/23/2019    Past Surgical History:  Procedure Laterality Date   AORTIC VALVE REPLACEMENT (AVR)/CORONARY ARTERY BYPASS GRAFTING (CABG)  2000   ATRIAL FIBRILLATION ABLATION N/A 05/22/2019   Procedure: ATRIAL FIBRILLATION ABLATION;  Surgeon: Constance Haw, MD;  Location: [facility];  Service: Cardiovascular;  Laterality: N/A;   BIOPSY  08/24/2021   Procedure: BIOPSY;  Surgeon: Milus Banister, MD;  Location: [facility];  Service: Endoscopy;;   CARDIOVERSION N/A 02/24/2018   Procedure: CARDIOVERSION;  Surgeon: Fay Records, MD;  Location: [facility];  Service: Cardiovascular;  Laterality: N/A;   CARDIOVERSION N/A 08/15/2018   Procedure: CARDIOVERSION;  Surgeon: Thayer Headings, MD;  Location: [facility];  Service: Cardiovascular;  Laterality: N/A;   COLONOSCOPY WITH PROPOFOL N/A 01/08/2018   Procedure: COLONOSCOPY WITH PROPOFOL;  Surgeon: Arta Silence, MD;  Location: [facility];  Service: Gastroenterology;  Laterality: N/A;   CORONARY/GRAFT ANGIOGRAPHY N/A 08/14/2018   Procedure: CORONARY/GRAFT ANGIOGRAPHY;  Surgeon: [name], MD;  Location: [place] [facility];  Service: Cardiovascular;  Laterality: N/A;   ESOPHAGOGASTRODUODENOSCOPY (EGD)  WITH PROPOFOL N/A 01/06/2018   Procedure: ESOPHAGOGASTRODUODENOSCOPY (EGD) WITH PROPOFOL;   Surgeon: Otis Brace, MD;  Location: [facility];  Service: Gastroenterology;  Laterality: N/A;   ESOPHAGOGASTRODUODENOSCOPY (EGD) WITH PROPOFOL N/A 08/24/2021   Procedure: ESOPHAGOGASTRODUODENOSCOPY (EGD) WITH PROPOFOL;  Surgeon: Milus Banister, MD;  Location: [facility];  Service: Endoscopy;  Laterality: N/A;   FOOT SURGERY Right    x 2    Current Outpatient Medications  Medication Instructions   diltiazem (CARDIZEM) 30 MG tablet TAKE 1 TABLET AS NEEDED FOR PALPITATIONS OR HEART RATE >100   diltiazem (TIADYLT ER) 120 mg, Oral, Daily   diphenhydramine-acetaminophen (TYLENOL PM) 25-500 MG TABS tablet 0.5 tablets, Oral, At bedtime PRN   ferrous sulfate 325 mg, Oral, Daily with supper   furosemide (LASIX) 20 mg, Oral, Daily PRN   hydrALAZINE (APRESOLINE) 25 MG tablet TAKE 1/2 OF A TABLET (12.5 MG TOTAL) BY MOUTH 3 TIMES A DAY   levothyroxine (SYNTHROID) 125 mcg, Oral, Daily before breakfast   Multiple Vitamins-Minerals (PRESERVISION AREDS 2 PO) 1 capsule, Oral, 2 times daily   pantoprazole (PROTONIX) 40 mg, Oral, 4 times weekly   Polyethyl Glycol-Propyl Glycol (SYSTANE OP) 1 drop, Both Eyes, Daily PRN   tamsulosin (FLOMAX) 0.8 mg, Oral, Daily   tobramycin (TOBREX) 0.3 % ophthalmic solution 1 drop, Left Eye, See admin instructions, Instill 1 drop into left eye 1 day before, the day of, and the day after eye injections administered at Dr's office    triamcinolone cream (KENALOG) 0.1 % 1 application , Topical, 2 times daily   valsartan (DIOVAN) 320 MG tablet TAKE 1 TABLET BY MOUTH EVERY DAY   Vitamin D 2,000 Units, Oral, Daily   warfarin (COUMADIN) 5-7.5 mg, Oral, See admin instructions, Per patient Sunday, Harrell Lark, & Saturday taking 7.5 mg = 1 & 1/2 tab. All other days taking 1  tab = '5mg'$  mg on  Mon, Wed, Friday    Allergies as of 03/21/2022 - Review Complete 03/21/2022  Allergen Reaction Noted   Carvedilol Shortness Of Breath 10/14/2019   Lopid [gemfibrozil] Other (See  Comments) 02/25/2014   Monopril [fosinopril] Other (See Comments) 02/25/2014   Vicodin [hydrocodone-acetaminophen]  02/25/2014   Phenergan [promethazine hcl] Other (See Comments) 02/25/2014   Zocor [simvastatin]  02/25/2014   Benicar [olmesartan]  02/25/2014   Codeine Nausea And Vomiting 11/25/2013   Prilosec [omeprazole]  02/25/2014    Family History  Problem Relation Age of Onset   Hypertension Mother    Heart attack Mother    Hypertension Father    Colon cancer Neg Hx    Esophageal cancer Neg Hx    Pancreatic cancer Neg Hx    Stomach cancer Neg Hx     Social History   Socioeconomic History   Marital status: Married    Spouse name: Not on file   Number of children: Not on file   Years of education: Not on file   Highest education level: Not on file  Occupational History   Not on file  Tobacco Use   Smoking status: Former    Types: Cigarettes   Smokeless tobacco: Never   Tobacco comments:    quit 57 years ago  Vaping Use   Vaping Use: Never used  Substance and Sexual Activity   Alcohol use: No   Drug use: No   Sexual activity: Not on file  Other Topics Concern   Not on file  Social History Narrative   Not on file   Social Determinants of Health  Financial Resource Strain: Not on file  Food Insecurity: Not on file  Transportation Needs: Not on file  Physical Activity: Not on file  Stress: Not on file  Social Connections: Not on file  Intimate Partner Violence: Not on file     Physical Exam: BP (!) 160/60 (BP Location: Left Arm, Patient Position: Sitting, Cuff Size: Normal)   Pulse 68   Ht 5' 8.5" (1.74 m) Comment: height measured without shoes  Wt 179 lb (81.2 kg)   BMI 26.82 kg/m  Constitutional: generally well-appearing Psychiatric: alert and oriented x3 Abdomen: soft, nontender, nondistended, no obvious ascites, no peritoneal signs, normal bowel sounds No peripheral edema noted in lower extremities  Assessment and plan: 87 y.o. male with  well compensated cryptogenic cirrhosis  He is really in amazing shape for 88 years old, he works around the house works in the yard.  His cirrhosis is well compensated and hopefully will remain that way.  We will get repeat imaging of his liver in 6 months with ultrasound and routine blood work at the same time including CBC, complete metabolic profile and alpha-fetoprotein.  He is on Coumadin and so coags are not helpful.  He will return to see me in 1 year and sooner if needed.  Please see the "Patient Instructions" section for addition details about the plan.  Owens Loffler, MD [facility] 03/21/2022, 11:16 AM   Total time on date of encounter was 20 minutes (this included time spent preparing to see the patient reviewing records; obtaining and/or reviewing separately obtained history; performing a medically appropriate exam and/or evaluation; counseling and educating the patient and family if present; ordering medications, tests or procedures if applicable; and documenting clinical information in the health record).      [name]

## 2022-03-28 ENCOUNTER — Ambulatory Visit: Payer: Self-pay

## 2022-04-02 NOTE — Patient Outreach (Signed)
  Care Coordination   Initial Visit Note    Name: Jason Santana MRN: [id number] DOB: 1932/03/21  Jason Santana is a 88 y.o. year old male who sees Jason Huddle, MD for primary care. I spoke with  Jason Santana by phone today  What matters to the patients health and wellness today?  Manage Swelling/Maintain Daily Weights   Goals Addressed             This Visit's Progress    Maintain Daily Weights and Manage  Swelling       Care Coordination Interventions: Reviewed medications and discussed current plan for edema management. Reports taking medications, monitoring weights and monitoring BP as advised. Reports restarting Lasix as advised by the Cardiology team. Notes significant improvement with edema and daily weight management. Reports minimal swelling today. Weight this morning was 175 lbs. Reports BP today was 127/58. Reviewed established weight parameters. Reviewed indications for notifying a provider for weight gain greater and 3 lbs overnight and greater than 5 lbs within a week. Reviewed s/sx and indications for seeking medical attention.        SDOH assessments and interventions completed:   Yes  SDOH Interventions    Flowsheet Row Most Recent Value  SDOH Interventions   Food Insecurity Interventions Intervention Not Indicated  Transportation Interventions Intervention Not Indicated        Care Coordination Interventions Activated:  Yes Care Coordination Interventions:  Yes, provided  Follow up plan: Follow up call scheduled for June 01, 2022.  Encounter Outcome:  Pt. Visit Completed  [facility] [phone]

## 2022-04-02 NOTE — Patient Instructions (Signed)
Visit Information Thank you for allowing the Care Management team to participate in your care.   Following are the goals we discussed:   Goals Addressed             This Visit's Progress    Maintain Daily Weights and Manage  Swelling       Care Coordination Interventions: Reviewed medications and discussed current plan for edema management. Reports taking medications, monitoring weights and monitoring BP as advised. Reports restarting Lasix as advised by the Cardiology team. Notes significant improvement with edema and daily weight management. Reports minimal swelling today. Weight this morning was 175 lbs. Reports BP today was 127/58. Reviewed established weight parameters. Reviewed indications for notifying a provider for weight gain greater and 3 lbs overnight and greater than 5 lbs within a week. Reviewed s/sx and indications for seeking medical attention.        Our next appointment is by telephone on June 01, 2022 at 1000. Please do not hesitate to contact me if you require assistance prior to the next outreach. Please call the care guide team at [phone] if you need to cancel or reschedule your appointment.    The patient verbalized understanding of information discussed. Declined need for mailed instructions or educational materials.  A member of the care team will follow up in September.  [facility] [phone]

## 2022-04-04 DIAGNOSIS — Z7901 Long term (current) use of anticoagulants: Secondary | ICD-10-CM | POA: Diagnosis not present

## 2022-05-02 DIAGNOSIS — Z7901 Long term (current) use of anticoagulants: Secondary | ICD-10-CM | POA: Diagnosis not present

## 2022-05-08 DIAGNOSIS — N3020 Other chronic cystitis without hematuria: Secondary | ICD-10-CM | POA: Diagnosis not present

## 2022-05-08 DIAGNOSIS — R3912 Poor urinary stream: Secondary | ICD-10-CM | POA: Diagnosis not present

## 2022-05-18 ENCOUNTER — Ambulatory Visit: Payer: Medicare Other | Attending: Cardiovascular Disease | Admitting: Pharmacist

## 2022-05-18 VITALS — BP 160/62 | HR 68

## 2022-05-18 DIAGNOSIS — I10 Essential (primary) hypertension: Secondary | ICD-10-CM | POA: Diagnosis not present

## 2022-05-18 NOTE — Patient Instructions (Addendum)
Continue valsartan 320 mg once daily, diltiazem ER '120mg'$  daily, furosemide 40 mg once daily Call me with any questions or concerns [phone]

## 2022-05-18 NOTE — Progress Notes (Signed)
Patient ID: [name]                 DOB: 12/13/1931                      MRN: [id number]     HPI: Jason Santana is a 88 y.o. male referred by Dr. Curt Santana to HTN clinic. PMH is significant for HTN, CAD s/p CABG, afib s/p ablation in September 2020, stable aortic aneurysm on CT scan (2022), mechanical St Jude AVR (2000) on warfarin, and recurrent UTIs (3 ED visits since February). He had a GI bleed May 2019 requiring a pause in anticoagulation.  He has had 3 cardioversions. Last ECHO 08/2018 with LVEF 55-60%. Recently patient has complained of palpitations in his ears and fast heart rate. He could not tolerate carvedilol due to wheezing, therefore he was started on diltiazem.  Pt has history of fluctuating blood pressures. Last seen by MD on 07/11/21 where BP was controlled for goal <140/90 and no medication changes were made. On 12/18/21, pt reported gynecomastia on spironolactone. Stopped spironolactone in setting of gynecomastia and started hydralazine 12.5 mg TID. At visit with PharmD on 01/15/22, BP elevated above goal and pt noted increased lower extremity edema in feet and ankles (10 lbs weight gain in one week) that correlated with stopping spironolactone and starting hydralazine. During that visit, hydralazine was stopped due to swelling and patient was told to bring in home BP monitor at this visit before making further medication changes. Pt's Scr taken at PCP since last visit was elevated from baseline.  At last visit on 6/12, no medication changes were made. Home reading were within goal.   Patient presents today to clinic. Reports BP doing well. Does have some swelling. Does not take lasix everyday. Does not take if he has somewhere to be. Having issues with his ankle that has pins in it. Is seeing a Psychologist, sport and exercise at [name] soon. Sometimes can barely walk. Stays busy, still fixing up cars.   Current HTN meds: valsartan 320 mg once daily, diltiazem ER '120mg'$  daily, furosemide 40 mg once  daily  Previously tried: fosinopril (transaminitis), olmesartan (abdominal cramping/IBS), metoprolol (bradycardia), hydrochlorothiazide (hyponatremia), carvedilol (wheezing), amlodipine (extensive LEE, gained 15.8 lbs), spironolactone (gynecomastia), hydralazine (swelling)  BP goal: <140/90 mmHg  Family History: Heart attack in his mother; Hypertension in his father and mother.   Social History: former smoker (quit 62 years ago), never alcohol   Diet: Reports eating a big breakfast (generally cereal and fruit), a large lunch (generally avoids fried foods), and rarely eats in the evenings.  Exercise: Patient tries to stay active- riding stationary bike about 20 min 5 days a week  Home BP readings (taking prior to medications): 130/64, 128/60, 138/63, 128/58, 125/57, 127/57, 139/65, 141/65, 138/66, 134/62, 124/57, 136/64 HR 60's  Clinic BP:   Wt Readings from Last 3 Encounters:  03/21/22 179 lb (81.2 kg)  02/12/22 179 lb 12.8 oz (81.6 kg)  10/21/21 188 lb 7.9 oz (85.5 kg)   BP Readings from Last 3 Encounters:  03/21/22 (!) 160/60  02/12/22 (!) 154/52  02/12/22 (!) 138/54   Pulse Readings from Last 3 Encounters:  03/21/22 68  02/12/22 63  02/12/22 65    Renal function: CrCl cannot be calculated (Patient's most recent lab result is older than the maximum 21 days allowed.).  Past Medical History:  Diagnosis Date   Anticoagulation adequate 05/23/2019   Atrial fibrillation (HCC)    CAD (coronary artery  disease) of artery bypass graft 2000   2000 with multiple bypasses    Chronic anticoagulation    Coumadin therapy for mechanical aortic valve. Postoperative atrial fibrillation.    Essential hypertension    H/O mechanical aortic valve replacement 2000   St Jude AVR   Hypothyroidism    Premature ventricular contractions    S/P ablation of atrial fibrillation 05/22/19 05/23/2019   Typical atrial flutter ([place]) 05/23/2019    Current Outpatient Medications on File Prior to Visit   Medication Sig Dispense Refill   Cholecalciferol (VITAMIN D) 2000 units tablet Take 2,000 Units by mouth daily.     diltiazem (CARDIZEM) 30 MG tablet TAKE 1 TABLET AS NEEDED FOR PALPITATIONS OR HEART RATE >100 30 tablet 6   diltiazem (TIADYLT ER) 120 MG 24 hr capsule Take 1 capsule (120 mg total) by mouth daily. 90 capsule 3   diphenhydramine-acetaminophen (TYLENOL PM) 25-500 MG TABS tablet Take 0.5 tablets by mouth at bedtime as needed (sleep).     ferrous sulfate 325 (65 FE) MG tablet Take 325 mg by mouth daily with supper.     furosemide (LASIX) 20 MG tablet Take 20 mg by mouth daily as needed for fluid.     hydrALAZINE (APRESOLINE) 25 MG tablet TAKE 1/2 OF A TABLET (12.5 MG TOTAL) BY MOUTH 3 TIMES A DAY (Patient not taking: Reported on 03/21/2022) 135 tablet 0   levothyroxine (SYNTHROID) 125 MCG tablet Take 125 mcg by mouth daily before breakfast.      Multiple Vitamins-Minerals (PRESERVISION AREDS 2 PO) Take 1 capsule by mouth 2 (two) times daily.     pantoprazole (PROTONIX) 40 MG tablet Take 40 mg by mouth 4 (four) times a week.     Polyethyl Glycol-Propyl Glycol (SYSTANE OP) Place 1 drop into both eyes daily as needed (dry eyes).     tamsulosin (FLOMAX) 0.4 MG CAPS capsule Take 0.8 mg by mouth daily.     tobramycin (TOBREX) 0.3 % ophthalmic solution Place 1 drop into the left eye See admin instructions. Instill 1 drop into left eye 1 day before, the day of, and the day after eye injections administered at Dr's office     triamcinolone cream (KENALOG) 0.1 % Apply 1 application topically 2 (two) times daily. 30 g 0   valsartan (DIOVAN) 320 MG tablet TAKE 1 TABLET BY MOUTH EVERY DAY 90 tablet 3   warfarin (COUMADIN) 5 MG tablet Take 5-7.5 mg by mouth See admin instructions. Per patient Sunday, Harrell Lark, & Saturday taking 7.5 mg = 1 & 1/2 tab. All other days taking 1  tab = '5mg'$  mg on  Mon, Wed, Friday     No current facility-administered medications on file prior to visit.    Allergies   Allergen Reactions   Carvedilol Shortness Of Breath    wheezing   Lopid [Gemfibrozil] Other (See Comments)    transaminitis   Monopril [Fosinopril] Other (See Comments)    transaminitis   Vicodin [Hydrocodone-Acetaminophen]     Severe sensitivity   Phenergan [Promethazine Hcl] Other (See Comments)    Severe somnolence.   Zocor [Simvastatin]     Short memory loss.   Benicar [Olmesartan]     Abdominal cramping and increased stools/ irritable bowel   Codeine Nausea And Vomiting   Prilosec [Omeprazole]     dyspepsia    Assessment/Plan:  1. Hypertension - Bp in clinic is above goal, but home readings at goal. Patient's BP in clinic is historically high. Home meter had been  previously found to be accurate. Continue valsartan 320 mg once daily, diltiazem ER '120mg'$  daily, furosemide 40 mg once daily. Follow up as needed   Thank you,  Ramond Dial, Pharm.D, BCPS, CPP [facility] [address], [place], [place] [address]  Phone: [phone]; Fax: [phone]    05/18/2022 8:10 AM

## 2022-05-29 DIAGNOSIS — G609 Hereditary and idiopathic neuropathy, unspecified: Secondary | ICD-10-CM | POA: Diagnosis not present

## 2022-05-29 DIAGNOSIS — I10 Essential (primary) hypertension: Secondary | ICD-10-CM | POA: Diagnosis not present

## 2022-05-29 DIAGNOSIS — N1831 Chronic kidney disease, stage 3a: Secondary | ICD-10-CM | POA: Diagnosis not present

## 2022-05-29 DIAGNOSIS — H3530 Unspecified macular degeneration: Secondary | ICD-10-CM | POA: Diagnosis not present

## 2022-05-29 DIAGNOSIS — E559 Vitamin D deficiency, unspecified: Secondary | ICD-10-CM | POA: Diagnosis not present

## 2022-05-29 DIAGNOSIS — Z23 Encounter for immunization: Secondary | ICD-10-CM | POA: Diagnosis not present

## 2022-05-29 DIAGNOSIS — E039 Hypothyroidism, unspecified: Secondary | ICD-10-CM | POA: Diagnosis not present

## 2022-05-29 DIAGNOSIS — Z0000 Encounter for general adult medical examination without abnormal findings: Secondary | ICD-10-CM | POA: Diagnosis not present

## 2022-05-29 DIAGNOSIS — I4891 Unspecified atrial fibrillation: Secondary | ICD-10-CM | POA: Diagnosis not present

## 2022-05-29 DIAGNOSIS — N401 Enlarged prostate with lower urinary tract symptoms: Secondary | ICD-10-CM | POA: Diagnosis not present

## 2022-05-29 DIAGNOSIS — I2510 Atherosclerotic heart disease of native coronary artery without angina pectoris: Secondary | ICD-10-CM | POA: Diagnosis not present

## 2022-05-29 DIAGNOSIS — D6869 Other thrombophilia: Secondary | ICD-10-CM | POA: Diagnosis not present

## 2022-05-29 DIAGNOSIS — Z1331 Encounter for screening for depression: Secondary | ICD-10-CM | POA: Diagnosis not present

## 2022-05-29 DIAGNOSIS — E782 Mixed hyperlipidemia: Secondary | ICD-10-CM | POA: Diagnosis not present

## 2022-05-29 DIAGNOSIS — L309 Dermatitis, unspecified: Secondary | ICD-10-CM | POA: Diagnosis not present

## 2022-06-20 DIAGNOSIS — H353111 Nonexudative age-related macular degeneration, right eye, early dry stage: Secondary | ICD-10-CM | POA: Diagnosis not present

## 2022-06-20 DIAGNOSIS — H35372 Puckering of macula, left eye: Secondary | ICD-10-CM | POA: Diagnosis not present

## 2022-06-20 DIAGNOSIS — H353222 Exudative age-related macular degeneration, left eye, with inactive choroidal neovascularization: Secondary | ICD-10-CM | POA: Diagnosis not present

## 2022-06-20 DIAGNOSIS — H348312 Tributary (branch) retinal vein occlusion, right eye, stable: Secondary | ICD-10-CM | POA: Diagnosis not present

## 2022-06-21 DIAGNOSIS — N3020 Other chronic cystitis without hematuria: Secondary | ICD-10-CM | POA: Diagnosis not present

## 2022-06-21 DIAGNOSIS — R3912 Poor urinary stream: Secondary | ICD-10-CM | POA: Diagnosis not present

## 2022-06-27 DIAGNOSIS — Z7901 Long term (current) use of anticoagulants: Secondary | ICD-10-CM | POA: Diagnosis not present

## 2022-07-18 DIAGNOSIS — Z7901 Long term (current) use of anticoagulants: Secondary | ICD-10-CM | POA: Diagnosis not present

## 2022-07-31 NOTE — Progress Notes (Unsigned)
Cardiology Office Note:    Date:  08/01/2022   ID:  JOAQUIM Santana, DOB May 27, 1932, MRN [id number]  PCP:  [name], [facility] Cardiologist:  None Electrophysiologist:  Will Meredith Leeds, MD     Referring MD: [name], MD   Chief Complaint:  No chief complaint on file.     History of Present Illness:   Jason Santana is a 88 y.o. male with  a hx of mechanical aortic valve replacement (2000), paroxysmal atrial fibrillation and atrial flutter and ablation 2020, chronic anticoagulation therapy, coronary artery disease with remote CABG, lower GI bleed May 2019 required pausing of anticoagulation therapy.  Electrical cardioversion performed June 2019, August 15, 2018, and October 22, 2018 prior to  A. fib/flutter ablation September 2020 ([place])--> [facility].  Patient last saw Dr. Tamala Julian 07/2021 and doing well.   Was seen in HTN clinic 05/2022 and stable.  Patient comes in for f/u. Denies chest pain, dyspnea, palpitations, bleeding issues. Last week he had leg swelling after stopping lasix for 2 months. He restarted it and it resolved. Eats out a lot-fast food.  Has lost 6 lbs. He rides his exercise bike 30 min daily.       Past Medical History:  Diagnosis Date   Anticoagulation adequate 05/23/2019   Atrial fibrillation ([place])    CAD (coronary artery disease) of artery bypass graft 2000   2000 with multiple bypasses    Chronic anticoagulation    Coumadin therapy for mechanical aortic valve. Postoperative atrial fibrillation.    Essential hypertension    H/O mechanical aortic valve replacement 2000   [employer] AVR   Hypothyroidism    Premature ventricular contractions    S/P ablation of atrial fibrillation 05/22/19 05/23/2019   Typical atrial flutter ([place]) 05/23/2019   Current Medications: Current Meds  Medication Sig   Cholecalciferol (VITAMIN D) 2000 units tablet Take 2,000 Units by mouth daily.   diltiazem (CARDIZEM) 30 MG tablet TAKE 1 TABLET AS  NEEDED FOR PALPITATIONS OR HEART RATE >100   diltiazem (TIADYLT ER) 120 MG 24 hr capsule Take 1 capsule (120 mg total) by mouth daily.   diphenhydramine-acetaminophen (TYLENOL PM) 25-500 MG TABS tablet Take 0.5 tablets by mouth at bedtime as needed (sleep).   ferrous sulfate 325 (65 FE) MG tablet Take 325 mg by mouth daily with supper.   furosemide (LASIX) 20 MG tablet Take 20 mg by mouth daily as needed for fluid.   levothyroxine (SYNTHROID) 125 MCG tablet Take 125 mcg by mouth daily before breakfast.    Multiple Vitamins-Minerals (PRESERVISION AREDS 2 PO) Take 1 capsule by mouth 2 (two) times daily.   pantoprazole (PROTONIX) 40 MG tablet Take 40 mg by mouth 4 (four) times a week.   Polyethyl Glycol-Propyl Glycol (SYSTANE OP) Place 1 drop into both eyes daily as needed (dry eyes).   tobramycin (TOBREX) 0.3 % ophthalmic solution Place 1 drop into the left eye See admin instructions. Instill 1 drop into left eye 1 day before, the day of, and the day after eye injections administered at Dr's office   triamcinolone cream (KENALOG) 0.1 % Apply 1 application topically 2 (two) times daily.   valsartan (DIOVAN) 320 MG tablet TAKE 1 TABLET BY MOUTH EVERY DAY   warfarin (COUMADIN) 5 MG tablet Take 5-7.5 mg by mouth See admin instructions. Per patient Sunday, Harrell Lark, & Saturday taking 7.5 mg = 1 & 1/2 tab. All other days taking 1  tab = '5mg'$  mg  on  Mon, Wed, Friday    Allergies:   Carvedilol, Lopid [gemfibrozil], Monopril [fosinopril], Vicodin [hydrocodone-acetaminophen], Phenergan [promethazine hcl], Zocor [simvastatin], Benicar [olmesartan], Codeine, and Prilosec [omeprazole]   Social History   Tobacco Use   Smoking status: Former    Types: Cigarettes   Smokeless tobacco: Never   Tobacco comments:    quit 54 years ago  Vaping Use   Vaping Use: Never used  Substance Use Topics   Alcohol use: No   Drug use: No    Family Hx: The patient's family history includes Heart attack in his mother;  Hypertension in his father and mother. There is no history of Colon cancer, Esophageal cancer, Pancreatic cancer, or Stomach cancer.  ROS     Physical Exam:    VS:  BP 138/68   Pulse 74   Ht 5' 8.5" (1.74 m)   Wt 183 lb (83 kg)   SpO2 97%   BMI 27.42 kg/m     Wt Readings from Last 3 Encounters:  08/01/22 183 lb (83 kg)  03/21/22 179 lb (81.2 kg)  02/12/22 179 lb 12.8 oz (81.6 kg)    Physical Exam  GEN: Well nourished, well developed, in no acute distress  Neck: no JVD, carotid bruits, or masses Cardiac:RRR; crisp valve sounds with 2/6 systolic murmur LSB Respiratory:  clear to auscultation bilaterally, normal work of breathing GI: soft, nontender, nondistended, + BS Ext: trace edema R>L Neuro:  Alert and Oriented x 3,   Psych: euthymic mood, full affect        EKGs/Labs/Other Test Reviewed:    EKG:  EKG is not ordered today.     Recent Labs: 03/08/2022: ALT 22; BUN 20; Creatinine, Ser 1.29; Hemoglobin 11.8; Platelets 184.0; Potassium 4.2; Sodium 132   Recent Lipid Panel No results for input(s): "CHOL", "TRIG", "HDL", "VLDL", "LDLCALC", "LDLDIRECT" in the last 8760 hours.   Prior CV Studies:    Chest CT scan 05/2021: IMPRESSION: 1. Stable top-normal/mildly enlarged ascending thoracic aorta measuring 3.9 cm by CTA today. 2. Stable chronic lung disease. 3. Nodular contour of liver likely reflecting underlying cirrhosis.   Aortic Atherosclerosis (ICD10-I70.0) and Emphysema (ICD10-J43.9).  TTE 08/12/18  Review of the above records today demonstrates:  - Left ventricle: The cavity size was normal. There was mild   concentric hypertrophy. Systolic function was normal. The   estimated ejection fraction was in the range of 55% to 60%. Wall   motion was normal; there were no regional wall motion   abnormalities. - Ventricular septum: Septal motion showed paradox. These changes   are consistent with a post-thoracotomy state. - Aortic valve: A mechanical prosthesis  was present and functioning   normally. - Mitral valve: Calcified annulus. There was mild regurgitation. - Left atrium: The atrium was mildly dilated. - Right ventricle: Systolic function was mildly to moderately   reduced. - Right atrium: The atrium was mildly dilated.   LHC 08/14/18 Mid RCA lesion is 100% stenosed. Ost 2nd Mrg lesion is 100% stenosed. Prox LAD to Mid LAD lesion is 50% stenosed. SVG. The graft exhibits mild .  1.  Severe two-vessel coronary artery disease with total occlusion of the OM 2 and the mid RCA, nonobstructive LAD stenosis 2.  Status post remote aortocoronary bypass surgery with continued patency of the LIMA to LAD, saphenous vein graft OM 2, and saphenous vein graft to PDA     Risk Assessment/Calculations/Metrics:    CHA2DS2-VASc Score = 5   This indicates a 7.2% annual risk  of stroke. The patient's score is based upon: CHF History: 1 HTN History: 1 Diabetes History: 0 Stroke History: 0 Vascular Disease History: 1 Age Score: 2 Gender Score: 0             ASSESSMENT & PLAN:   No problem-specific Assessment & Plan notes found for this encounter.   LE edema for the past week-has been off lasix for a couple of months and eats out regularly. Has lost 6 lbs since resuming lasix 20 mg daily. Still with a trace of edema. Check bmet and update echo. Also has Nonalcoholic cirrhosis liver.   Persistent atrial fibrillation: Currently on warfarin.  Status post ablation 05/22/2019.  CHA2DS2-VASc of 4.  No further episodes maintaining NSR, last EKG reviewed 02/2022 in [facility]     Mechanical aortic valve replacement 2020 : last echo 2019 but with recent edema and murmur will update echo.      Coronary artery disease remote CABG: No current chest pain    Hypertension: controlled on valsartan, diltiazem, lasix.   Aortic aneurysm: Found on preablation CT.  Repeat CT 05/2021 3.9 cm      Secondary hypercoagulable state: Currently on warfarin for atrial fibrillation  as above              Dispo:  No follow-ups on file.   Medication Adjustments/Labs and Tests Ordered: Current medicines are reviewed at length with the patient today.  Concerns regarding medicines are outlined above.  Tests Ordered: Orders Placed This Encounter  Procedures   Basic Metabolic Panel (BMET)   ECHOCARDIOGRAM COMPLETE   Medication Changes: No orders of the defined types were placed in this encounter.  Signed, Ermalinda Barrios, PA-C  08/01/2022 11:11 AM    [address] [place], [place], [place]  [address] Phone: [phone]; Fax: [phone]

## 2022-08-01 ENCOUNTER — Ambulatory Visit: Payer: Medicare Other | Attending: Physician Assistant | Admitting: Physician Assistant

## 2022-08-01 ENCOUNTER — Encounter: Payer: Self-pay | Admitting: Physician Assistant

## 2022-08-01 VITALS — BP 138/68 | HR 74 | Ht 68.5 in | Wt 183.0 lb

## 2022-08-01 DIAGNOSIS — I4819 Other persistent atrial fibrillation: Secondary | ICD-10-CM | POA: Insufficient documentation

## 2022-08-01 DIAGNOSIS — I25810 Atherosclerosis of coronary artery bypass graft(s) without angina pectoris: Secondary | ICD-10-CM | POA: Diagnosis not present

## 2022-08-01 DIAGNOSIS — R600 Localized edema: Secondary | ICD-10-CM | POA: Insufficient documentation

## 2022-08-01 DIAGNOSIS — I10 Essential (primary) hypertension: Secondary | ICD-10-CM | POA: Diagnosis not present

## 2022-08-01 DIAGNOSIS — D6869 Other thrombophilia: Secondary | ICD-10-CM | POA: Diagnosis not present

## 2022-08-01 DIAGNOSIS — Z952 Presence of prosthetic heart valve: Secondary | ICD-10-CM | POA: Diagnosis not present

## 2022-08-01 DIAGNOSIS — I7121 Aneurysm of the ascending aorta, without rupture: Secondary | ICD-10-CM | POA: Insufficient documentation

## 2022-08-01 NOTE — Patient Instructions (Signed)
Medication Instructions:  Your physician recommends that you continue on your current medications as directed. Please refer to the Current Medication list given to you today. *If you need a refill on your cardiac medications before your next appointment, please call your pharmacy*   Lab Work: TODAY-BMET If you have labs (blood work) drawn today and your tests are completely normal, you will receive your results only by: [place] (if you have MyChart) OR A paper copy in the mail If you have any lab test that is abnormal or we need to change your treatment, we will call you to review the results.   Testing/Procedures: Your physician has requested that you have an echocardiogram. Echocardiography is a painless test that uses sound waves to create images of your heart. It provides your doctor with information about the size and shape of your heart and how well your heart's chambers and valves are working. This procedure takes approximately one hour. There are no restrictions for this procedure. Please do NOT wear cologne, perfume, aftershave, or lotions (deodorant is allowed). Please arrive 15 minutes prior to your appointment time.   Follow-Up: At [facility], you and your health needs are our priority.  As part of our continuing mission to provide you with exceptional heart care, we have created designated Provider Care Teams.  These Care Teams include your primary Cardiologist (physician) and Advanced Practice Providers (APPs -  Physician Assistants and Nurse Practitioners) who all work together to provide you with the care you need, when you need it.  We recommend signing up for the patient portal called "MyChart".  Sign up information is provided on this After Visit Summary.  MyChart is used to connect with patients for Virtual Visits (Telemedicine).  Patients are able to view lab/test results, encounter notes, upcoming appointments, etc.  Non-urgent messages can be sent to  your provider as well.   To learn more about what you can do with MyChart, go to [email or web address].    Your next appointment:   AFTER ECHO   The format for your next appointment:   In Person  Provider:   Lenna Sciara, MD or Ermalinda Barrios, PA  Other Instructions   Important Information About Sugar

## 2022-08-02 LAB — BASIC METABOLIC PANEL
BUN/Creatinine Ratio: 14 (ref 10–24)
BUN: 18 mg/dL (ref 8–27)
CO2: 24 mmol/L (ref 20–29)
Calcium: 9.7 mg/dL (ref 8.6–10.2)
Chloride: 103 mmol/L (ref 96–106)
Creatinine, Ser: 1.33 mg/dL — ABNORMAL HIGH (ref 0.76–1.27)
Glucose: 109 mg/dL — ABNORMAL HIGH (ref 70–99)
Potassium: 4.6 mmol/L (ref 3.5–5.2)
Sodium: 138 mmol/L (ref 134–144)
eGFR: 51 mL/min/{1.73_m2} — ABNORMAL LOW (ref >59)

## 2022-08-03 ENCOUNTER — Telehealth: Payer: Self-pay | Admitting: Physician Assistant

## 2022-08-03 DIAGNOSIS — Z79899 Other long term (current) drug therapy: Secondary | ICD-10-CM

## 2022-08-03 NOTE — Telephone Encounter (Signed)
-----   Message from Imogene Burn, PA-C sent at 08/02/2022  7:48 AM EST ----- Kidney function up on lasix. Continue current dose and recheck bmet next week. If still high we will need to back off on lasix. Reiterate 2 gm sodium diet. Is there a way we can get him in for an echo before Jan as scheduled? thanks

## 2022-08-03 NOTE — Telephone Encounter (Signed)
° °  Pt is returning call to get lab result °

## 2022-08-03 NOTE — Telephone Encounter (Signed)
Patient made aware of results. Pt is scheduled to come in for repeat lab work next Wednesday. The earliest I was able to reschedule the echo was for 08/24/22.

## 2022-08-08 ENCOUNTER — Ambulatory Visit: Payer: Medicare Other | Attending: Physician Assistant

## 2022-08-08 DIAGNOSIS — Z79899 Other long term (current) drug therapy: Secondary | ICD-10-CM | POA: Diagnosis not present

## 2022-08-08 LAB — BASIC METABOLIC PANEL
BUN/Creatinine Ratio: 16 (ref 10–24)
BUN: 19 mg/dL (ref 8–27)
CO2: 22 mmol/L (ref 20–29)
Calcium: 9.5 mg/dL (ref 8.6–10.2)
Chloride: 104 mmol/L (ref 96–106)
Creatinine, Ser: 1.22 mg/dL (ref 0.76–1.27)
Glucose: 130 mg/dL — ABNORMAL HIGH (ref 70–99)
Potassium: 4.4 mmol/L (ref 3.5–5.2)
Sodium: 138 mmol/L (ref 134–144)
eGFR: 57 mL/min/{1.73_m2} — ABNORMAL LOW (ref >59)

## 2022-08-15 DIAGNOSIS — Z7901 Long term (current) use of anticoagulants: Secondary | ICD-10-CM | POA: Diagnosis not present

## 2022-08-24 ENCOUNTER — Ambulatory Visit (HOSPITAL_COMMUNITY): Payer: Medicare Other | Attending: Cardiology

## 2022-08-24 DIAGNOSIS — Z952 Presence of prosthetic heart valve: Secondary | ICD-10-CM | POA: Diagnosis not present

## 2022-08-24 LAB — ECHOCARDIOGRAM COMPLETE
AV Mean grad: 14.3 mmHg
AV Peak grad: 32 mmHg
Ao pk vel: 2.83 m/s
Area-P 1/2: 2.62 cm2
S' Lateral: 2.95 cm

## 2022-09-02 ENCOUNTER — Other Ambulatory Visit: Payer: Self-pay | Admitting: Cardiology

## 2022-09-04 ENCOUNTER — Telehealth: Payer: Self-pay

## 2022-09-04 DIAGNOSIS — K7460 Unspecified cirrhosis of liver: Secondary | ICD-10-CM

## 2022-09-04 NOTE — Telephone Encounter (Signed)
This is a patient of Dr Ardis Hughs.  Patient needs to have RUQ ultrasound for hepatoma screening.  He also needs to have CBC, CMET, AFP.    Is it OK to order these labs and ultrasound under your name as you are DOD pm?  Please advise.

## 2022-09-04 NOTE — Telephone Encounter (Signed)
-----   Message from Stevan Born, [place] sent at 03/21/2022 11:37 AM EDT ----- Regarding: January 2024 RUQ ultrasound and lab work CBC, CMET, AFP, DJ, dx cirrhosis

## 2022-09-04 NOTE — Telephone Encounter (Signed)
That is fine, thanks for asking.

## 2022-09-05 NOTE — Telephone Encounter (Addendum)
Patient aware that he is scheduled for RUQ abdominal ultrasound on 09-12-22 at 8:00 at [facility].  He was advised to check in at 7:30am, and NPO after midnight.  Patient instructed to have lab work drawn on the day of the ultrasound. Patient agreed to plan and verbalized understanding.  No further questions.

## 2022-09-06 ENCOUNTER — Other Ambulatory Visit (HOSPITAL_COMMUNITY): Payer: Medicare Other

## 2022-09-12 ENCOUNTER — Ambulatory Visit (HOSPITAL_COMMUNITY)
Admission: RE | Admit: 2022-09-12 | Discharge: 2022-09-12 | Disposition: A | Discharge status: Home / Self Care | Payer: Medicare Other | Source: Ambulatory Visit | Attending: Gastroenterology | Admitting: Gastroenterology

## 2022-09-12 ENCOUNTER — Other Ambulatory Visit (INDEPENDENT_AMBULATORY_CARE_PROVIDER_SITE_OTHER): Payer: Medicare Other

## 2022-09-12 DIAGNOSIS — K7460 Unspecified cirrhosis of liver: Secondary | ICD-10-CM | POA: Insufficient documentation

## 2022-09-12 LAB — COMPREHENSIVE METABOLIC PANEL
ALT: 20 U/L (ref 0–53)
AST: 49 U/L — ABNORMAL HIGH (ref 0–37)
Albumin: 3.8 g/dL (ref 3.5–5.2)
Alkaline Phosphatase: 81 U/L (ref 39–117)
BUN: 27 mg/dL — ABNORMAL HIGH (ref 6–23)
CO2: 22 mEq/L (ref 19–32)
Calcium: 9.8 mg/dL (ref 8.4–10.5)
Chloride: 106 mEq/L (ref 96–112)
Creatinine, Ser: 1.39 mg/dL (ref 0.40–1.50)
GFR: 45.38 mL/min — ABNORMAL LOW (ref >60.00)
Glucose, Bld: 110 mg/dL — ABNORMAL HIGH (ref 70–99)
Potassium: 4.5 mEq/L (ref 3.5–5.1)
Sodium: 139 mEq/L (ref 135–145)
Total Bilirubin: 1.7 mg/dL — ABNORMAL HIGH (ref 0.2–1.2)
Total Protein: 7.9 g/dL (ref 6.0–8.3)

## 2022-09-12 LAB — CBC
HCT: 38.8 % — ABNORMAL LOW (ref 39.0–52.0)
Hemoglobin: 13.1 g/dL (ref 13.0–17.0)
MCHC: 33.8 g/dL (ref 30.0–36.0)
MCV: 103.1 fl — ABNORMAL HIGH (ref 78.0–100.0)
Platelets: 193 10*3/uL (ref 150.0–400.0)
RBC: 3.77 Mil/uL — ABNORMAL LOW (ref 4.22–5.81)
RDW: 13.7 % (ref 11.5–15.5)
WBC: 5.1 10*3/uL (ref 4.0–10.5)

## 2022-09-14 DIAGNOSIS — Z7901 Long term (current) use of anticoagulants: Secondary | ICD-10-CM | POA: Diagnosis not present

## 2022-09-14 LAB — AFP TUMOR MARKER: AFP-Tumor Marker: 5.5 ng/mL (ref <6.1)

## 2022-09-27 ENCOUNTER — Ambulatory Visit: Payer: Medicare Other | Admitting: Internal Medicine

## 2022-10-05 ENCOUNTER — Encounter: Payer: Self-pay | Admitting: Cardiology

## 2022-10-05 ENCOUNTER — Ambulatory Visit: Payer: Medicare Other | Attending: Internal Medicine | Admitting: Cardiology

## 2022-10-05 VITALS — BP 160/60 | HR 95 | Ht 70.0 in | Wt 182.6 lb

## 2022-10-05 DIAGNOSIS — I4819 Other persistent atrial fibrillation: Secondary | ICD-10-CM | POA: Insufficient documentation

## 2022-10-05 DIAGNOSIS — I25810 Atherosclerosis of coronary artery bypass graft(s) without angina pectoris: Secondary | ICD-10-CM | POA: Insufficient documentation

## 2022-10-05 DIAGNOSIS — Z952 Presence of prosthetic heart valve: Secondary | ICD-10-CM | POA: Insufficient documentation

## 2022-10-05 NOTE — Patient Instructions (Signed)
Medication Instructions:  The current medical regimen is effective;  continue present plan and medications.  *If you need a refill on your cardiac medications before your next appointment, please call your pharmacy*  Follow-Up: At [facility], you and your health needs are our priority.  As part of our continuing mission to provide you with exceptional heart care, we have created designated Provider Care Teams.  These Care Teams include your primary Cardiologist (physician) and Advanced Practice Providers (APPs -  Physician Assistants and Nurse Practitioners) who all work together to provide you with the care you need, when you need it.  We recommend signing up for the patient portal called "MyChart".  Sign up information is provided on this After Visit Summary.  MyChart is used to connect with patients for Virtual Visits (Telemedicine).  Patients are able to view lab/test results, encounter notes, upcoming appointments, etc.  Non-urgent messages can be sent to your provider as well.   To learn more about what you can do with MyChart, go to [email or web address].    Your next appointment:   6 month(s)  Provider:   Ermalinda Barrios, PA-C     Then, Dr Candee Furbish  will plan to see you again in 1 year(s).

## 2022-10-05 NOTE — Progress Notes (Signed)
Cardiology Office Note:    Date:  10/05/2022   ID:  Jason Santana, DOB Jan 24, 1932, MRN [id number]  PCP:  [name], MD   [facility] Cardiologist:  Candee Furbish, MD Electrophysiologist:  Will Meredith Leeds, MD     Referring MD: [name], MD    History of Present Illness:    Jason Santana is a 89 y.o. male  hx of mechanical aortic valve replacement (2000), paroxysmal atrial fibrillation and atrial flutter and ablation 2020, chronic anticoagulation therapy, coronary artery disease with remote CABG, lower GI bleed May 2019 required pausing of anticoagulation therapy.  Electrical cardioversion performed June 2019, August 15, 2018, and October 22, 2018 prior to  A. fib/flutter ablation September 2020 ([place])--> NSR.   Patient last saw Dr. Tamala Julian 07/2021 and doing well.   Was seen in HTN clinic 05/2022 and stable.  Past Medical History:  Diagnosis Date   Anticoagulation adequate 05/23/2019   Atrial fibrillation ([place])    CAD (coronary artery disease) of artery bypass graft 2000   2000 with multiple bypasses    Chronic anticoagulation    Coumadin therapy for mechanical aortic valve. Postoperative atrial fibrillation.    Essential hypertension    H/O mechanical aortic valve replacement 2000   St Jude AVR   Hypothyroidism    Premature ventricular contractions    S/P ablation of atrial fibrillation 05/22/19 05/23/2019   Typical atrial flutter ([place]) 05/23/2019    Past Surgical History:  Procedure Laterality Date   AORTIC VALVE REPLACEMENT (AVR)/CORONARY ARTERY BYPASS GRAFTING (CABG)  2000   ATRIAL FIBRILLATION ABLATION N/A 05/22/2019   Procedure: ATRIAL FIBRILLATION ABLATION;  Surgeon: Constance Haw, MD;  Location: [facility];  Service: Cardiovascular;  Laterality: N/A;   BIOPSY  08/24/2021   Procedure: BIOPSY;  Surgeon: Milus Banister, MD;  Location: [facility];  Service: Endoscopy;;   CARDIOVERSION N/A 02/24/2018   Procedure:  CARDIOVERSION;  Surgeon: Fay Records, MD;  Location: [facility] ENDOSCOPY;  Service: Cardiovascular;  Laterality: N/A;   CARDIOVERSION N/A 08/15/2018   Procedure: CARDIOVERSION;  Surgeon: Thayer Headings, MD;  Location: [facility] ENDOSCOPY;  Service: Cardiovascular;  Laterality: N/A;   COLONOSCOPY WITH PROPOFOL N/A 01/08/2018   Procedure: COLONOSCOPY WITH PROPOFOL;  Surgeon: Arta Silence, MD;  Location: [facility];  Service: Gastroenterology;  Laterality: N/A;   CORONARY/GRAFT ANGIOGRAPHY N/A 08/14/2018   Procedure: CORONARY/GRAFT ANGIOGRAPHY;  Surgeon: [name], MD;  Location: [facility];  Service: Cardiovascular;  Laterality: N/A;   ESOPHAGOGASTRODUODENOSCOPY (EGD) WITH PROPOFOL N/A 01/06/2018   Procedure: ESOPHAGOGASTRODUODENOSCOPY (EGD) WITH PROPOFOL;  Surgeon: Otis Brace, MD;  Location: [facility];  Service: Gastroenterology;  Laterality: N/A;   ESOPHAGOGASTRODUODENOSCOPY (EGD) WITH PROPOFOL N/A 08/24/2021   Procedure: ESOPHAGOGASTRODUODENOSCOPY (EGD) WITH PROPOFOL;  Surgeon: Milus Banister, MD;  Location: [facility];  Service: Endoscopy;  Laterality: N/A;   FOOT SURGERY Right    x 2    Current Medications: Current Meds  Medication Sig   Cholecalciferol (VITAMIN D) 2000 units tablet Take 2,000 Units by mouth daily.   diltiazem (CARDIZEM) 30 MG tablet TAKE 1 TABLET AS NEEDED FOR PALPITATIONS OR HEART RATE >100   diphenhydramine-acetaminophen (TYLENOL PM) 25-500 MG TABS tablet Take 0.5 tablets by mouth at bedtime as needed (sleep).   ferrous sulfate 325 (65 FE) MG tablet Take 325 mg by mouth daily with supper.   furosemide (LASIX) 20 MG tablet Take 20 mg by mouth daily as needed for fluid.   levothyroxine (SYNTHROID) 125  MCG tablet Take 125 mcg by mouth daily before breakfast.    Multiple Vitamins-Minerals (PRESERVISION AREDS 2 PO) Take 1 capsule by mouth 2 (two) times daily.   pantoprazole (PROTONIX) 40 MG tablet Take 40 mg by mouth 4 (four) times a week.    Polyethyl Glycol-Propyl Glycol (SYSTANE OP) Place 1 drop into both eyes daily as needed (dry eyes).   tamsulosin (FLOMAX) 0.4 MG CAPS capsule Take 0.8 mg by mouth daily.   TIADYLT ER 120 MG 24 hr capsule TAKE 1 CAPSULE BY MOUTH EVERY DAY   tobramycin (TOBREX) 0.3 % ophthalmic solution Place 1 drop into the left eye See admin instructions. Instill 1 drop into left eye 1 day before, the day of, and the day after eye injections administered at Dr's office   triamcinolone cream (KENALOG) 0.1 % Apply 1 application topically 2 (two) times daily.   valsartan (DIOVAN) 320 MG tablet TAKE 1 TABLET BY MOUTH EVERY DAY   warfarin (COUMADIN) 5 MG tablet Take 5-7.5 mg by mouth See admin instructions. Per patient Sunday, Harrell Lark, & Saturday taking 7.5 mg = 1 & 1/2 tab. All other days taking 1  tab = '5mg'$  mg on  Mon, Wed, Friday     Allergies:   Carvedilol, Lopid [gemfibrozil], Monopril [fosinopril], Vicodin [hydrocodone-acetaminophen], Phenergan [promethazine hcl], Zocor [simvastatin], Benicar [olmesartan], Codeine, and Prilosec [omeprazole]   Social History   Socioeconomic History   Marital status: Married    Spouse name: Not on file   Number of children: Not on file   Years of education: Not on file   Highest education level: Not on file  Occupational History   Not on file  Tobacco Use   Smoking status: Former    Types: Cigarettes   Smokeless tobacco: Never   Tobacco comments:    quit 57 years ago  Vaping Use   Vaping Use: Never used  Substance and Sexual Activity   Alcohol use: No   Drug use: No   Sexual activity: Not on file  Other Topics Concern   Not on file  Social History Narrative   Not on file   Social Determinants of Health   Financial Resource Strain: Not on file  Food Insecurity: No Food Insecurity (03/28/2022)   Hunger Vital Sign    Worried About Running Out of Food in the Last Year: Never true    Ran Out of Food in the Last Year: Never true  Transportation Needs: No  Transportation Needs (03/28/2022)   PRAPARE - Hydrologist (Medical): No    Lack of Transportation (Non-Medical): No  Physical Activity: Not on file  Stress: Not on file  Social Connections: Not on file     Family History: The patient's family history includes Heart attack in his mother; Hypertension in his father and mother. There is no history of Colon cancer, Esophageal cancer, Pancreatic cancer, or Stomach cancer.  ROS:   Please see the history of present illness.     All other systems reviewed and are negative.  EKGs/Labs/Other Studies Reviewed:    The following studies were reviewed today:  Chest CT scan 05/2021: IMPRESSION: 1. Stable top-normal/mildly enlarged ascending thoracic aorta measuring 3.9 cm by CTA today. 2. Stable chronic lung disease. 3. Nodular contour of liver likely reflecting underlying cirrhosis.   Aortic Atherosclerosis (ICD10-I70.0) and Emphysema (ICD10-J43.9).   08/24/22 ECHO  1. S/P AVR with mean gradient 14 mmHg and no AI.   2. Left ventricular ejection fraction, by  estimation, is 60 to 65%. The  left ventricle has normal function. The left ventricle has no regional  wall motion abnormalities. There is mild concentric left ventricular  hypertrophy. Left ventricular diastolic  parameters are indeterminate.   3. Right ventricular systolic function is normal. The right ventricular  size is normal.   4. The mitral valve is normal in structure. Trivial mitral valve  regurgitation. No evidence of mitral stenosis. Severe mitral annular  calcification.   5. The aortic valve has been repaired/replaced. Aortic valve  regurgitation is not visualized. No aortic stenosis is present. There is a  St. Jude mechanical valve present in the aortic position.   6. Aortic dilatation noted. There is mild dilatation of the ascending  aorta, measuring 43 mm.   TTE 08/12/18  Review of the above records today demonstrates:  - Left ventricle:  The cavity size was normal. There was mild   concentric hypertrophy. Systolic function was normal. The   estimated ejection fraction was in the range of 55% to 60%. Wall   motion was normal; there were no regional wall motion   abnormalities. - Ventricular septum: Septal motion showed paradox. These changes   are consistent with a post-thoracotomy state. - Aortic valve: A mechanical prosthesis was present and functioning   normally. - Mitral valve: Calcified annulus. There was mild regurgitation. - Left atrium: The atrium was mildly dilated. - Right ventricle: Systolic function was mildly to moderately   reduced. - Right atrium: The atrium was mildly dilated.   LHC 08/14/18 Mid RCA lesion is 100% stenosed. Ost 2nd Mrg lesion is 100% stenosed. Prox LAD to Mid LAD lesion is 50% stenosed. SVG. The graft exhibits mild .  1.  Severe two-vessel coronary artery disease with total occlusion of the OM 2 and the mid RCA, nonobstructive LAD stenosis 2.  Status post remote aortocoronary bypass surgery with continued patency of the LIMA to LAD, saphenous vein graft OM 2, and saphenous vein graft to PDA    EKG: Prior as below  Recent Labs: 09/12/2022: ALT 20; BUN 27; Creatinine, Ser 1.39; Hemoglobin 13.1; Platelets 193.0; Potassium 4.5; Sodium 139  Recent Lipid Panel    Component Value Date/Time   CHOL 130 04/15/2019 0905   TRIG 157 (H) 04/15/2019 0905   HDL 30 (L) 04/15/2019 0905   CHOLHDL 4.3 04/15/2019 0905   CHOLHDL 7.7 08/12/2018 0225   VLDL 48 (H) 08/12/2018 0225   LDLCALC 69 04/15/2019 0905     Risk Assessment/Calculations:              Physical Exam:    VS:  BP (!) 160/60   Pulse 95   Ht '5\' 10"'$  (1.778 m)   Wt 182 lb 9.6 oz (82.8 kg)   SpO2 98%   BMI 26.20 kg/m     Wt Readings from Last 3 Encounters:  10/05/22 182 lb 9.6 oz (82.8 kg)  08/01/22 183 lb (83 kg)  03/21/22 179 lb (81.2 kg)     GEN:  Well nourished, well developed in no acute distress HEENT:  Normal NECK: No JVD; No carotid bruits, bounding carotids LYMPHATICS: No lymphadenopathy CARDIAC: Sharp S2 click RRR, no murmurs, rubs, gallops RESPIRATORY:  Clear to auscultation without rales, wheezing or rhonchi  ABDOMEN: Soft, non-tender, non-distended MUSCULOSKELETAL:  No edema; No deformity  SKIN: Warm and dry NEUROLOGIC:  Alert and oriented x 3 PSYCHIATRIC:  Normal affect   ASSESSMENT:    1. H/O mechanical aortic valve replacement   2. Persistent atrial  fibrillation ([place])   3. Coronary artery disease involving coronary bypass graft of native heart without angina pectoris    PLAN:    In order of problems listed above:  Mechanical aortic valve 2020 -Doing well.  Echocardiogram 2023 showed normal function.  Chronic anticoagulation - INR goal 2-3.  No bleeding.  Last INR 2.5 hemoglobin 13.1 creatinine 1.39.  He gets this checked at primary care physician's office.  Lower extremity edema - Fairly chronic right greater than left.  2+ today.  Okay to take furosemide as needed.  Persistent atrial fibrillation - Had ablation 2020 Dr. Curt Bears.  CHA2DS2-VASc score is 4.  Been maintaining normal sinus rhythm.  EKG June 2023 normal rhythm.  Coronary artery disease status post CABG - Stable with no anginal symptoms.  Aortic dilatation - 43 mm root on last echocardiogram 2023.  We will continue to monitor.  Prior CT showed 39 mm.  Hypertension - Does have a degree of whitecoat hypertension.  Usually at home is in the 500B to 704U systolic.  Continue with current medication management.  No changes made.   Dispo: 34-monthfollow-up            Medication Adjustments/Labs and Tests Ordered: Current medicines are reviewed at length with the patient today.  Concerns regarding medicines are outlined above.  No orders of the defined types were placed in this encounter.  No orders of the defined types were placed in this encounter.   Patient Instructions  Medication  Instructions:  The current medical regimen is effective;  continue present plan and medications.  *If you need a refill on your cardiac medications before your next appointment, please call your pharmacy*  Follow-Up: [facility] you and your health needs are our priority.  As part of our continuing mission to provide you with exceptional heart care, we have created designated Provider Care Teams.  These Care Teams include your primary Cardiologist (physician) and Advanced Practice Providers (APPs -  Physician Assistants and Nurse Practitioners) who all work together to provide you with the care you need, when you need it.  We recommend signing up for the patient portal called "MyChart".  Sign up information is provided on this After Visit Summary.  MyChart is used to connect with patients for Virtual Visits (Telemedicine).  Patients are able to view lab/test results, encounter notes, upcoming appointments, etc.  Non-urgent messages can be sent to your provider as well.   To learn more about what you can do with MyChart, go to [email or web address]    Your next appointment:   6 month(s)  Provider:   MErmalinda Barrios PA-C     Then, Dr MCandee Furbish will plan to see you again in 1 year(s).      Signed, MCandee Furbish MD  10/05/2022 2:46 PM    [place]

## 2022-10-12 DIAGNOSIS — Z7901 Long term (current) use of anticoagulants: Secondary | ICD-10-CM | POA: Diagnosis not present

## 2022-10-15 ENCOUNTER — Other Ambulatory Visit: Payer: Self-pay

## 2022-10-16 NOTE — Telephone Encounter (Signed)
Patient called after hour line today, c/o elevated BP 187/85 30 minutes ago, now is 181/82, AP is 87, he denied any symptoms such as SOB, chest pain, dizziness. Etc. He states he feels well. He has PRN cardizem 43m for tachycardia. He took valsartan 3261mthis morning and Tiadylt ER 12049monight already. Advised the patient to take Cardizem 34m9m tonight. Repeat BP in 1 hour, if gradually lowering, then continue trend BP at home; if BP continue to be elevated or higher, or he develop symptoms of HTN emergency, call back, he agreed.

## 2022-10-17 ENCOUNTER — Other Ambulatory Visit: Payer: Self-pay | Admitting: Pharmacist

## 2022-10-17 ENCOUNTER — Telehealth: Payer: Self-pay | Admitting: Cardiology

## 2022-10-17 ENCOUNTER — Telehealth: Payer: Self-pay | Admitting: Pharmacist

## 2022-10-17 MED ORDER — DILTIAZEM HCL 30 MG PO TABS: ORAL_TABLET | ORAL | 6 refills | Status: DC

## 2022-10-17 NOTE — Telephone Encounter (Signed)
Patient c/o Palpitations:  High priority if patient c/o lightheadedness, shortness of breath, or chest pain  How long have you had palpitations/irregular HR/ Afib? Are you having the symptoms now? No  Are you currently experiencing lightheadedness, SOB or CP? No  Do you have a history of afib (atrial fibrillation) or irregular heart rhythm? Yes  Have you checked your BP or HR? (document readings if available):   Are you experiencing any other symptoms? BP elevated a little

## 2022-10-17 NOTE — Telephone Encounter (Signed)
Pt left voicemail on PharmD line about elevated BP readings. Called him back, I see earlier message from today about him having rhythm issues. He has been scheduled with afib clinic on 2/20. He reports BP was running normally before he started feeling like he was out of rhythm. Advised him automatic BP cuffs are less accurate when pts are in afib. He'll continue with his valsartan for his BP. I did send in refill on his diltiazem 49m prn per his request. He'll keep afib appt next week and call with any concerns before then.

## 2022-10-17 NOTE — Telephone Encounter (Signed)
Pt reporting having an irregular rhythm for possibly the past several days HR 60s No symptoms Pt aware forwarding to AFib clinic to call him to arrange OV for evaluation. Pt agreeable to plan.

## 2022-10-23 ENCOUNTER — Encounter (HOSPITAL_COMMUNITY): Payer: Self-pay | Admitting: Nurse Practitioner

## 2022-10-23 ENCOUNTER — Ambulatory Visit (HOSPITAL_COMMUNITY)
Admission: RE | Admit: 2022-10-23 | Discharge: 2022-10-23 | Disposition: A | Discharge status: Home / Self Care | Payer: Medicare Other | Source: Ambulatory Visit | Attending: Nurse Practitioner | Admitting: Nurse Practitioner

## 2022-10-23 VITALS — BP 176/58 | HR 70 | Ht 70.0 in | Wt 184.2 lb

## 2022-10-23 DIAGNOSIS — I10 Essential (primary) hypertension: Secondary | ICD-10-CM | POA: Diagnosis not present

## 2022-10-23 DIAGNOSIS — Z7901 Long term (current) use of anticoagulants: Secondary | ICD-10-CM | POA: Diagnosis not present

## 2022-10-23 DIAGNOSIS — R002 Palpitations: Secondary | ICD-10-CM

## 2022-10-23 DIAGNOSIS — I2510 Atherosclerotic heart disease of native coronary artery without angina pectoris: Secondary | ICD-10-CM | POA: Insufficient documentation

## 2022-10-23 DIAGNOSIS — Z951 Presence of aortocoronary bypass graft: Secondary | ICD-10-CM | POA: Diagnosis not present

## 2022-10-23 DIAGNOSIS — I4892 Unspecified atrial flutter: Secondary | ICD-10-CM | POA: Diagnosis not present

## 2022-10-23 DIAGNOSIS — I4819 Other persistent atrial fibrillation: Secondary | ICD-10-CM | POA: Diagnosis not present

## 2022-10-23 DIAGNOSIS — Z79899 Other long term (current) drug therapy: Secondary | ICD-10-CM | POA: Diagnosis not present

## 2022-10-23 MED ORDER — DILTIAZEM HCL ER BEADS 120 MG PO CP24: 120.0000 mg | ORAL_CAPSULE | Freq: Two times a day (BID) | ORAL | 1 refills | Status: DC

## 2022-10-23 NOTE — Patient Instructions (Signed)
Increase diltiazem to 161m twice a day

## 2022-10-23 NOTE — Progress Notes (Signed)
Primary Care Physician: [name], MD Primary Cardiologist: Dr Tamala Julian Primary Electrophysiologist: Dr Curt Bears Referring Physician: [facility]   Jason Santana is a 89 y.o. male with a history of CAD, HTN, mechanical AV (2000),  atrial fibrillation, and atrial flutter who presents for follow up in the [facility]. He is s/p afib and flutter ablation with Dr Curt Bears on 05/22/19. He reports that he has done very well since the ablation with no heart racing or palpitations ubntil last week and then he noticed irregular heart rate off and on. Would usually not last any longer than 2 hours. He would take 30 mg Cardizem as needed. He did not feel bad. Usually would find irregular rhythm with feeling pulse as he eid not feel bad otherwise.      Today, he denies symptoms of palpitations, chest pain, shortness of breath, orthopnea, PND, lower extremity edema, dizziness, presyncope, syncope, snoring, daytime somnolence, bleeding, or neurologic sequela. The patient is tolerating medications without difficulties and is otherwise without complaint today.    Atrial Fibrillation Risk Factors:  he does not have symptoms or diagnosis of sleep apnea.  he has a BMI of Body mass index is 25.05 kg/m.[name] Filed Weights   06/19/19 0940  Weight: 79.2 kg    Family History  Problem Relation Age of Onset   Hypertension Mother    Heart attack Mother    Hypertension Father      Atrial Fibrillation Management history:  Previous antiarrhythmic drugs: amiodarone (stopped 2/2 elevated LFTs and TSH) Previous cardioversions: 2019 x2 Previous ablations: 05/22/19 fib and flutter CHADS2VASC score: 4 Anticoagulation history: warfarin   Past Medical History:  Diagnosis Date   Anticoagulation adequate 05/23/2019   Atrial fibrillation (HCC)    CAD (coronary artery disease) of artery bypass graft 2000   2000 with multiple bypasses    Chronic anticoagulation    Coumadin therapy for  mechanical aortic valve. Postoperative atrial fibrillation.    Essential hypertension    H/O mechanical aortic valve replacement 2000   St Jude AVR   Hypothyroidism    Premature ventricular contractions    S/P ablation of atrial fibrillation 05/22/19 05/23/2019   Typical atrial flutter ([place]) 05/23/2019   Past Surgical History:  Procedure Laterality Date   AORTIC VALVE REPLACEMENT (AVR)/CORONARY ARTERY BYPASS GRAFTING (CABG)  2000   ATRIAL FIBRILLATION ABLATION N/A 05/22/2019   Procedure: ATRIAL FIBRILLATION ABLATION;  Surgeon: Constance Haw, MD;  Location: [facility];  Service: Cardiovascular;  Laterality: N/A;   CARDIOVERSION N/A 02/24/2018   Procedure: CARDIOVERSION;  Surgeon: Fay Records, MD;  Location: [facility] ENDOSCOPY;  Service: Cardiovascular;  Laterality: N/A;   CARDIOVERSION N/A 08/15/2018   Procedure: CARDIOVERSION;  Surgeon: Thayer Headings, MD;  Location: [facility];  Service: Cardiovascular;  Laterality: N/A;   COLONOSCOPY WITH PROPOFOL N/A 01/08/2018   Procedure: COLONOSCOPY WITH PROPOFOL;  Surgeon: Arta Silence, MD;  Location: [facility] ENDOSCOPY;  Service: Gastroenterology;  Laterality: N/A;   CORONARY/GRAFT ANGIOGRAPHY N/A 08/14/2018   Procedure: CORONARY/GRAFT ANGIOGRAPHY;  Surgeon: [name], MD;  Location: [facility];  Service: Cardiovascular;  Laterality: N/A;   ESOPHAGOGASTRODUODENOSCOPY (EGD) WITH PROPOFOL N/A 01/06/2018   Procedure: ESOPHAGOGASTRODUODENOSCOPY (EGD) WITH PROPOFOL;  Surgeon: Otis Brace, MD;  Location: [facility]OSCOPY;  Service: Gastroenterology;  Laterality: N/A;    Current Outpatient Medications  Medication Sig Dispense Refill   amLODipine (NORVASC) 5 MG tablet Take 1 tablet (5 mg total) by mouth daily. 90 tablet 3  Cholecalciferol (VITAMIN D) 2000 units tablet Take 2,000 Units by mouth daily.     diphenhydramine-acetaminophen (TYLENOL PM) 25-500 MG TABS tablet Take 0.5 tablets by mouth at bedtime as needed (sleep).      ferrous sulfate 325 (65 FE) MG tablet Take 325 mg by mouth daily with supper.     levothyroxine (SYNTHROID) 125 MCG tablet Take 125 mcg by mouth daily before breakfast.      Multiple Vitamins-Minerals (PRESERVISION AREDS 2 PO) Take 1 capsule by mouth 2 (two) times daily.     pantoprazole (PROTONIX) 40 MG tablet Take 40 mg by mouth 4 (four) times a week.     Polyethyl Glycol-Propyl Glycol (SYSTANE OP) Place 1 drop into both eyes daily as needed (dry eyes).     sulfamethoxazole-trimethoprim (BACTRIM DS) 800-160 MG tablet      tobramycin (TOBREX) 0.3 % ophthalmic solution Place 1 drop into the left eye See admin instructions. Instill 1 drop into left eye 1 day before, the day of, and the day after eye injections administered at Dr's office     warfarin (COUMADIN) 5 MG tablet Take 5 mg by mouth daily. Taking 7.70m on Wednesday and Sunday     No current facility-administered medications for this encounter.     Allergies  Allergen Reactions   Lopid [Gemfibrozil] Other (See Comments)    transaminitis   Monopril [Fosinopril] Other (See Comments)    transaminitis   Vicodin [Hydrocodone-Acetaminophen]     Severe sensitivity   Phenergan [Promethazine Hcl] Other (See Comments)    Severe somnolence.   Zocor [Simvastatin]     Short memory loss.   Benicar [Olmesartan]     Abdominal cramping and increased stools/ irritable bowel   Codeine Nausea And Vomiting   Prilosec [Omeprazole]     dyspepsia    Social History   Socioeconomic History   Marital status: Married    Spouse name: Not on file   Number of children: Not on file   Years of education: Not on file   Highest education level: Not on file  Occupational History   Not on file  Social Needs   Financial resource strain: Not on file   Food insecurity    Worry: Not on file    Inability: Not on file   Transportation needs    Medical: Not on file    Non-medical: Not on file  Tobacco Use   Smoking status: Former Smoker    Types:  Cigarettes   Smokeless tobacco: Never Used   Tobacco comment: quit 57 years ago  Substance and Sexual Activity   Alcohol use: No   Drug use: No   Sexual activity: Not on file  Lifestyle   Physical activity    Days per week: Not on file    Minutes per session: Not on file   Stress: Not on file  Relationships   Social connections    Talks on phone: Not on file    Gets together: Not on file    Attends religious service: Not on file    Active member of club or organization: Not on file    Attends meetings of clubs or organizations: Not on file    Relationship status: Not on file   Intimate partner violence    Fear of current or ex partner: Not on file    Emotionally abused: Not on file    Physically abused: Not on file    Forced sexual activity: Not on file  Other Topics Concern  Not on file  Social History Narrative   Not on file     ROS- All systems are reviewed and negative except as per the HPI above.  Physical Exam: Vitals:   06/19/19 0940  BP: (!) 178/60  Pulse: 77  Weight: 79.2 kg  Height: 5' 10"$  (1.778 m)    GEN- The patient is well appearing elderly male, alert and oriented x 3 today.   Head- normocephalic, atraumatic Eyes-  Sclera clear, conjunctiva pink Ears- hearing intact Oropharynx- clear Neck- supple  Lungs- Clear to ausculation bilaterally, normal work of breathing Heart- Regular rate and rhythm, no, rubs or gallops. 2/6 systolic murmur, mech S2 GI- soft, NT, ND, + BS Extremities- no clubbing, cyanosis, or edema MS- no significant deformity or atrophy Skin- no rash or lesion Psych- euthymic mood, full affect Neuro- strength and sensation are intact  Wt Readings from Last 3 Encounters:  06/19/19 79.2 kg  06/17/19 79.9 kg  05/30/19 77 kg    EKG today demonstrates  Vent. rate 70 BPM PR interval 174 ms QRS duration 102 ms QT/QTcB 424/457 ms P-R-T axes 4 -62 73 Normal sinus rhythm Left anterior fascicular block Moderate voltage criteria  for LVH, may be normal variant ( R in aVL , Cornell product ) Abnormal ECG When compared with ECG of 12-Jun-2021 21:07, PREVIOUS ECG IS PRESENT  Echo 08/12/18 demonstrated  - Left ventricle: The cavity size was normal. There was mild   concentric hypertrophy. Systolic function was normal. The   estimated ejection fraction was in the range of 55% to 60%. Wall   motion was normal; there were no regional wall motion   abnormalities. - Ventricular septum: Septal motion showed paradox. These changes   are consistent with a post-thoracotomy state. - Aortic valve: A mechanical prosthesis was present and functioning   normally. - Mitral valve: Calcified annulus. There was mild regurgitation. - Left atrium: The atrium was mildly dilated. - Right ventricle: Systolic function was mildly to moderately   reduced. - Right atrium: The atrium was mildly dilated.  Epic records are reviewed at length today  Assessment and Plan:  1. Persistent atrial fibrillation/atrial flutter S/p afib and flutter ablation with Dr Curt Bears 05/22/19. Patient appeared to be maintaining SR until recently and has had irregular heart beat  for the last week, he knew only by feeling his pulse, did not feel bad  In SR today Will increase diltiazem to 120 mg bid  Pt is asking for a repeat ablation I will refer to Dr. Curt Bears but his age and paucity of symptoms may be a factor for not pursuing redo  Continue warfarin  This patients CHA2DS2-VASc Score and unadjusted Ischemic Stroke Rate (% per year) is equal to 4.8 % stroke rate/year from a score of 4  Above score calculated as 1 point each if present [CHF, HTN, DM, Vascular=MI/PAD/Aortic Plaque, Age if 65-74, or Male] Above score calculated as 2 points each if present [Age > 75, or Stroke/TIA/TE]  2. CAD S/p remote CABG No anginal symptoms. Continue present therapy and risk factor modification.   3. HTN  Increased Cardizem dose will help   4. AVR (mechanical)   Continue warfarin    Follow up with Dr Curt Bears as requested    Geroge Baseman. Kirtan Sada, [facility] [address] [place], [place] [address] [phone]

## 2022-11-02 DIAGNOSIS — E119 Type 2 diabetes mellitus without complications: Secondary | ICD-10-CM | POA: Diagnosis not present

## 2022-11-02 DIAGNOSIS — I4891 Unspecified atrial fibrillation: Secondary | ICD-10-CM | POA: Diagnosis not present

## 2022-11-02 DIAGNOSIS — D6859 Other primary thrombophilia: Secondary | ICD-10-CM | POA: Diagnosis not present

## 2022-11-02 DIAGNOSIS — R300 Dysuria: Secondary | ICD-10-CM | POA: Diagnosis not present

## 2022-11-02 DIAGNOSIS — Z7901 Long term (current) use of anticoagulants: Secondary | ICD-10-CM | POA: Diagnosis not present

## 2022-11-07 DIAGNOSIS — Z952 Presence of prosthetic heart valve: Secondary | ICD-10-CM | POA: Diagnosis not present

## 2022-11-07 DIAGNOSIS — I4891 Unspecified atrial fibrillation: Secondary | ICD-10-CM | POA: Diagnosis not present

## 2022-11-07 DIAGNOSIS — R600 Localized edema: Secondary | ICD-10-CM | POA: Diagnosis not present

## 2022-11-07 DIAGNOSIS — N3001 Acute cystitis with hematuria: Secondary | ICD-10-CM | POA: Diagnosis not present

## 2022-11-13 ENCOUNTER — Ambulatory Visit: Payer: Medicare Other | Admitting: Gastroenterology

## 2022-11-14 ENCOUNTER — Encounter: Payer: Self-pay | Admitting: Cardiology

## 2022-11-14 ENCOUNTER — Ambulatory Visit: Payer: Medicare Other | Attending: Cardiology | Admitting: Cardiology

## 2022-11-14 VITALS — BP 142/64 | HR 74 | Ht 70.0 in | Wt 185.0 lb

## 2022-11-14 DIAGNOSIS — I4819 Other persistent atrial fibrillation: Secondary | ICD-10-CM

## 2022-11-14 DIAGNOSIS — I2510 Atherosclerotic heart disease of native coronary artery without angina pectoris: Secondary | ICD-10-CM

## 2022-11-14 DIAGNOSIS — I10 Essential (primary) hypertension: Secondary | ICD-10-CM

## 2022-11-14 DIAGNOSIS — D6869 Other thrombophilia: Secondary | ICD-10-CM | POA: Diagnosis not present

## 2022-11-14 NOTE — Progress Notes (Signed)
Electrophysiology Office Note   Date:  11/14/2022   ID:  Jason Santana, DOB Nov 12, 1931, MRN [id number]  PCP:  Corliss Blacker, MD  Cardiologist:  Tamala Julian Primary Electrophysiologist:  Shanicqua Coldren Meredith Leeds, MD    No chief complaint on file.     History of Present Illness: Jason Santana is a 89 y.o. male who is being seen today for the evaluation of atrial fibrillation at the request of [name], MD. Presenting today for electrophysiology evaluation.    He has a history significant for mechanical aortic valve replacement in 2000, atrial fibrillation, atrial flutter, coronary artery disease.  He is post atrial fibrillation ablation 05/22/2019.  Today, denies symptoms of palpitations, chest pain, shortness of breath, orthopnea, PND, lower extremity edema, claudication, dizziness, presyncope, syncope, bleeding, or neurologic sequela. The patient is tolerating medications without difficulties.  Since being seen he has done well.  He did have 1 episode of atrial fibrillation.  He was taking a new medication for COVID-19 at that time.  Aside from that, he has no major complaints.  He remains able to do all of his daily activities.  He initially was considering a repeat ablation but he does not want any further changes.   Past Medical History:  Diagnosis Date   Anticoagulation adequate 05/23/2019   Atrial fibrillation ([place])    CAD (coronary artery disease) of artery bypass graft 2000   2000 with multiple bypasses    Chronic anticoagulation    Coumadin therapy for mechanical aortic valve. Postoperative atrial fibrillation.    Essential hypertension    H/O mechanical aortic valve replacement 2000   St Jude AVR   Hypothyroidism    Premature ventricular contractions    S/P ablation of atrial fibrillation 05/22/19 05/23/2019   Typical atrial flutter ([place]) 05/23/2019   Past Surgical History:  Procedure Laterality Date   AORTIC VALVE REPLACEMENT (AVR)/CORONARY ARTERY BYPASS GRAFTING (CABG)   2000   ATRIAL FIBRILLATION ABLATION N/A 05/22/2019   Procedure: ATRIAL FIBRILLATION ABLATION;  Surgeon: Constance Haw, MD;  Location: [facility];  Service: Cardiovascular;  Laterality: N/A;   BIOPSY  08/24/2021   Procedure: BIOPSY;  Surgeon: Milus Banister, MD;  Location: [facility];  Service: Endoscopy;;   CARDIOVERSION N/A 02/24/2018   Procedure: CARDIOVERSION;  Surgeon: Fay Records, MD;  Location: [facility] ENDOSCOPY;  Service: Cardiovascular;  Laterality: N/A;   CARDIOVERSION N/A 08/15/2018   Procedure: CARDIOVERSION;  Surgeon: Thayer Headings, MD;  Location: [facility] ENDOSCOPY;  Service: Cardiovascular;  Laterality: N/A;   COLONOSCOPY WITH PROPOFOL N/A 01/08/2018   Procedure: COLONOSCOPY WITH PROPOFOL;  Surgeon: Arta Silence, MD;  Location: [facility]OSCOPY;  Service: Gastroenterology;  Laterality: N/A;   CORONARY/GRAFT ANGIOGRAPHY N/A 08/14/2018   Procedure: CORONARY/GRAFT ANGIOGRAPHY;  Surgeon: [name], MD;  Location: [facility];  Service: Cardiovascular;  Laterality: N/A;   ESOPHAGOGASTRODUODENOSCOPY (EGD) WITH PROPOFOL N/A 01/06/2018   Procedure: ESOPHAGOGASTRODUODENOSCOPY (EGD) WITH PROPOFOL;  Surgeon: Otis Brace, MD;  Location: [facility]OS[facility];  Service: Gastroenterology;  Laterality: N/A;   ESOPHAGOGASTRODUODENOSCOPY (EGD) WITH PROPOFOL N/A 08/24/2021   Procedure: ESOPHAGOGASTRODUODENOSCOPY (EGD) WITH PROPOFOL;  Surgeon: Milus Banister, MD;  Location: [facility] ENDOS[facility];  Service: Endoscopy;  Laterality: N/A;   FOOT SURGERY Right    x 2     Current Outpatient Medications  Medication Sig Dispense Refill   Cholecalciferol (VITAMIN D) 2000 units tablet Take 2,000 Units by mouth daily.     diltiazem (CARDIZEM) 30 MG tablet TAKE 1 TABLET AS  NEEDED FOR PALPITATIONS OR HEART RATE >100 30 tablet 6   diltiazem (TIADYLT ER) 120 MG 24 hr capsule Take 1 capsule (120 mg total) by mouth 2 (two) times daily. 180 capsule 1   diphenhydramine-acetaminophen (TYLENOL PM)  25-500 MG TABS tablet Take 0.5 tablets by mouth at bedtime as needed (sleep).     ferrous sulfate 325 (65 FE) MG tablet Take 325 mg by mouth daily with supper.     furosemide (LASIX) 20 MG tablet Take 20 mg by mouth daily as needed for fluid.     levothyroxine (SYNTHROID) 125 MCG tablet Take 125 mcg by mouth daily before breakfast.      Multiple Vitamins-Minerals (PRESERVISION AREDS 2 PO) Take 1 capsule by mouth 2 (two) times daily.     pantoprazole (PROTONIX) 40 MG tablet Take 40 mg by mouth 4 (four) times a week.     Polyethyl Glycol-Propyl Glycol (SYSTANE OP) Place 1 drop into both eyes daily as needed (dry eyes).     tamsulosin (FLOMAX) 0.4 MG CAPS capsule Take 0.8 mg by mouth daily.     tobramycin (TOBREX) 0.3 % ophthalmic solution Place 1 drop into the left eye See admin instructions. Instill 1 drop into left eye 1 day before, the day of, and the day after eye injections administered at Dr's office     triamcinolone cream (KENALOG) 0.1 % Apply 1 application topically 2 (two) times daily. 30 g 0   valsartan (DIOVAN) 320 MG tablet TAKE 1 TABLET BY MOUTH EVERY DAY 90 tablet 3   warfarin (COUMADIN) 5 MG tablet Take 5-7.5 mg by mouth See admin instructions. Per patient Sunday, Harrell Lark, & Saturday taking 7.5 mg = 1 & 1/2 tab. All other days taking 1  tab = '5mg'$  mg on  Mon, Wed, Friday     No current facility-administered medications for this visit.    Allergies:   Carvedilol, Lopid [gemfibrozil], Monopril [fosinopril], Vicodin [hydrocodone-acetaminophen], Phenergan [promethazine hcl], Zocor [simvastatin], Benicar [olmesartan], Codeine, and Prilosec [omeprazole]   Social History:  The patient  reports that he has quit smoking. His smoking use included cigarettes. He has never used smokeless tobacco. He reports that he does not drink alcohol and does not use drugs.   Family History:  The patient's family history includes Heart attack in his mother; Hypertension in his father and mother.   ROS:   Please see the history of present illness.   Otherwise, review of systems is positive for none.   All other systems are reviewed and negative.   PHYSICAL EXAM: VS:  BP (!) 142/64   Pulse 74   Ht '5\' 10"'$  (1.778 m)   Wt 185 lb (83.9 kg)   SpO2 96%   BMI 26.54 kg/m  , BMI Body mass index is 26.54 kg/m. GEN: Well nourished, well developed, in no acute distress  HEENT: normal  Neck: no JVD, carotid bruits, or masses Cardiac: RRR; no murmurs, rubs, or gallops,no edema  Respiratory:  clear to auscultation bilaterally, normal work of breathing GI: soft, nontender, nondistended, + BS MS: no deformity or atrophy  Skin: warm and dry Neuro:  Strength and sensation are intact Psych: euthymic mood, full affect  EKG:  EKG is not ordered today. Personal review of the ekg ordered 10/23/22 shows sinus rhythm, LVH   Recent Labs: 09/12/2022: ALT 20; BUN 27; Creatinine, Ser 1.39; Hemoglobin 13.1; Platelets 193.0; Potassium 4.5; Sodium 139    Lipid Panel     Component Value Date/Time   CHOL  130 04/15/2019 0905   TRIG 157 (H) 04/15/2019 0905   HDL 30 (L) 04/15/2019 0905   CHOLHDL 4.3 04/15/2019 0905   CHOLHDL 7.7 08/12/2018 0225   VLDL 48 (H) 08/12/2018 0225   LDLCALC 69 04/15/2019 0905     Wt Readings from Last 3 Encounters:  11/14/22 185 lb (83.9 kg)  10/23/22 184 lb 3.2 oz (83.6 kg)  10/05/22 182 lb 9.6 oz (82.8 kg)      Other studies Reviewed: Additional studies/ records that were reviewed today include: TTE 08/12/18  Review of the above records today demonstrates:  - Left ventricle: The cavity size was normal. There was mild   concentric hypertrophy. Systolic function was normal. The   estimated ejection fraction was in the range of 55% to 60%. Wall   motion was normal; there were no regional wall motion   abnormalities. - Ventricular septum: Septal motion showed paradox. These changes   are consistent with a post-thoracotomy state. - Aortic valve: A mechanical prosthesis was  present and functioning   normally. - Mitral valve: Calcified annulus. There was mild regurgitation. - Left atrium: The atrium was mildly dilated. - Right ventricle: Systolic function was mildly to moderately   reduced. - Right atrium: The atrium was mildly dilated.  LHC 08/14/18 Mid RCA lesion is 100% stenosed. Ost 2nd Mrg lesion is 100% stenosed. Prox LAD to Mid LAD lesion is 50% stenosed. SVG. The graft exhibits mild .  1.  Severe two-vessel coronary artery disease with total occlusion of the OM 2 and the mid RCA, nonobstructive LAD stenosis 2.  Status post remote aortocoronary bypass surgery with continued patency of the LIMA to LAD, saphenous vein graft OM 2, and saphenous vein graft to PDA  ASSESSMENT AND PLAN:  1.  Persistent atrial fibrillation: Currently on warfarin.  Status post ablation 05/22/2019.  CHA2DS2-VASc of 4.  He remains in normal rhythm.  He did have 1 episode of atrial fibrillation that he feels like was related to a new medication.  He has had no further episodes.  No changes.  2.  Mechanical aortic valve: Stable on most recent echo.  Plan per primary cardiology.  3.  Coronary artery disease: No current chest pain  4.  Hypertension: Followed in pharmacy hypertension clinic  5.  Aortic aneurysm: Found on preablation CT.  Repeat CT without issue.  6.  Secondary hypercoagulable state: Currently on warfarin for atrial fibrillation   Current medicines are reviewed at length with the patient today.   The patient does not have concerns regarding his medicines.  The following changes were made today: None  Labs/ tests ordered today include:  No orders of the defined types were placed in this encounter.    Disposition:   FU 6 months  Signed, Adalene Gulotta Meredith Leeds, MD  11/14/2022 3:21 PM     [address] [place] [place] [address] [phone] (office) [phone] (fax)

## 2022-11-14 NOTE — Patient Instructions (Signed)
Medication Instructions:  Your physician recommends that you continue on your current medications as directed. Please refer to the Current Medication list given to you today.  *If you need a refill on your cardiac medications before your next appointment, please call your pharmacy*   Lab Work: None ordered If you have labs (blood work) drawn today and your tests are completely normal, you will receive your results only by: [place] (if you have MyChart) OR A paper copy in the mail If you have any lab test that is abnormal or we need to change your treatment, we will call you to review the results.   Testing/Procedures: None ordered   Follow-Up: At [facility], you and your health needs are our priority.  As part of our continuing mission to provide you with exceptional heart care, we have created designated Provider Care Teams.  These Care Teams include your primary Cardiologist (physician) and Advanced Practice Providers (APPs -  Physician Assistants and Nurse Practitioners) who all work together to provide you with the care you need, when you need it.  We recommend signing up for the patient portal called "MyChart".  Sign up information is provided on this After Visit Summary.  MyChart is used to connect with patients for Virtual Visits (Telemedicine).  Patients are able to view lab/test results, encounter notes, upcoming appointments, etc.  Non-urgent messages can be sent to your provider as well.   To learn more about what you can do with MyChart, go to [email or web address].    Your next appointment:   6 month(s)  The format for your next appointment:   In Person  Provider:   You will follow up in the [facility] located at [facility]. Your provider will be: Clint R. Fenton, PA-C    Thank you for choosing [facility]!!   Trinidad Curet, RN [phone]  Other Instructions

## 2022-11-16 ENCOUNTER — Ambulatory Visit (INDEPENDENT_AMBULATORY_CARE_PROVIDER_SITE_OTHER): Payer: Medicare Other | Admitting: Gastroenterology

## 2022-11-16 ENCOUNTER — Encounter: Payer: Self-pay | Admitting: Gastroenterology

## 2022-11-16 VITALS — BP 144/62 | HR 65 | Ht 70.0 in | Wt 184.0 lb

## 2022-11-16 DIAGNOSIS — Z79899 Other long term (current) drug therapy: Secondary | ICD-10-CM

## 2022-11-16 DIAGNOSIS — K7460 Unspecified cirrhosis of liver: Secondary | ICD-10-CM | POA: Diagnosis not present

## 2022-11-16 DIAGNOSIS — K219 Gastro-esophageal reflux disease without esophagitis: Secondary | ICD-10-CM | POA: Diagnosis not present

## 2022-11-16 NOTE — Patient Instructions (Addendum)
  _______________________________________________________  If your blood pressure at your visit was 140/90 or greater, please contact your primary care physician to follow up on this.  _______________________________________________________  If you are age 87 or older, your body mass index should be between 23-30. Your Body mass index is 26.4 kg/m. If this is out of the aforementioned range listed, please consider follow up with your Primary Care Provider.  If you are age 30 or younger, your body mass index should be between 19-25. Your Body mass index is 26.4 kg/m. If this is out of the aformentioned range listed, please consider follow up with your Primary Care Provider.   ________________________________________________________  The  GI providers would like to encourage you to use [facility] to communicate with providers for non-urgent requests or questions.  Due to long hold times on the telephone, sending your provider a message by [facility] may be a faster and more efficient way to get a response.  Please allow 48 business hours for a response.  Please remember that this is for non-urgent requests.  _______________________________________________________  It was a pleasure to see you today!  Thank you for trusting me with your gastrointestinal care!

## 2022-11-16 NOTE — Progress Notes (Signed)
HPI :  89 year old male here for a follow-up visit for cirrhosis, accompanied by his wife today.  They have followed historically with Dr. Ardis Hughs of our practice, I am covering this patient in his absence today.  Prior notes reviewed and history obtained from the patient and his wife.  He historically has had compensated cryptogenic cirrhosis diagnosed several years ago.  He states this was an incidental finding when he was imaged for another reason.  He has had an extensive serologic workup which has not revealed a clear diagnosis.  They never were told he had a history of fatty liver in the past, he has never drank alcohol previously.  Fortunately since being monitored for this he has had no decompensations.  No jaundice, no ascites, no hepatic encephalopathy, no variceal bleeding.  He had an EGD with Dr. Ardis Hughs in December 2022 which showed no varices.  He is quite active for his age, does a lot of work around his house.  He does have some lower extremity edema, uses some Lasix as needed which works well for him.  He does take Coumadin for history of A-fib and mechanical aortic valve, thus his INR is not able to be followed in light of his liver disease.  He has a history of GERD, he thinks ulcers in the past as well.  He has been maintained on Protonix 40 mg which she takes about 3 days/week.  He states that frequency of dosing works well for him and denies any breakthrough.  He had labs done in January as well as right upper quadrant ultrasound, all of which looked good.  He denies any complaints today and is feeling well.  Prior workup: EGD December 2022 Dr. Ardis Hughs mild nonspecific gastritis.  No esophageal varices, no gastric varices, no portal gastropathy.  Biopsies were negative for H. Pylori  Laboratory work-up late 2022 TTG negative, IgA normal, anti-smooth muscle antibody negative, antimitochondrial antibodies positive at 37, alpha-1 antitrypsin level normal, alpha-fetoprotein level  normal, ANA negative, hepatitis C antibody negative, hepatitis B surface antigen nonreactive, hepatitis B surface antibody nonreactive, hepatitis A total antibody negative, July 2023 AMA recheck, still positive but lower at 30. No prior liver biopsy. Hepatitis A/B immunization series started 2022 by primary care physician  Colonoscopy Dr. Arta Silence May 2019 while hospitalized for bleeding showed diverticulosis, hemorrhoids, no polyps.      RUQ [place] 09/2022: IMPRESSION: 1. Cirrhotic liver with no liver mass identified.    Past Medical History:  Diagnosis Date   Anticoagulation adequate 05/23/2019   Atrial fibrillation ([place])    CAD (coronary artery disease) of artery bypass graft 2000   2000 with multiple bypasses    Chronic anticoagulation    Coumadin therapy for mechanical aortic valve. Postoperative atrial fibrillation.    Essential hypertension    H/O mechanical aortic valve replacement 2000   St Jude AVR   Hypothyroidism    Premature ventricular contractions    S/P ablation of atrial fibrillation 05/22/19 05/23/2019   Typical atrial flutter ([place]) 05/23/2019     Past Surgical History:  Procedure Laterality Date   AORTIC VALVE REPLACEMENT (AVR)/CORONARY ARTERY BYPASS GRAFTING (CABG)  2000   ATRIAL FIBRILLATION ABLATION N/A 05/22/2019   Procedure: ATRIAL FIBRILLATION ABLATION;  Surgeon: Constance Haw, MD;  Location: [place] [facility];  Service: Cardiovascular;  Laterality: N/A;   BIOPSY  08/24/2021   Procedure: BIOPSY;  Surgeon: Milus Banister, MD;  Location: [facility];  Service: Endoscopy;;   CARDIOVERSION N/A 02/24/2018  Procedure: CARDIOVERSION;  Surgeon: Fay Records, MD;  Location: [facility] ENDOSCOPY;  Service: Cardiovascular;  Laterality: N/A;   CARDIOVERSION N/A 08/15/2018   Procedure: CARDIOVERSION;  Surgeon: Thayer Headings, MD;  Location: [place];  Service: Cardiovascular;  Laterality: N/A;   COLONOSCOPY WITH PROPOFOL N/A 01/08/2018   Procedure:  COLONOSCOPY WITH PROPOFOL;  Surgeon: Arta Silence, MD;  Location: [facility];  Service: Gastroenterology;  Laterality: N/A;   CORONARY/GRAFT ANGIOGRAPHY N/A 08/14/2018   Procedure: CORONARY/GRAFT ANGIOGRAPHY;  Surgeon: [name], MD;  Location: [place] [facility];  Service: Cardiovascular;  Laterality: N/A;   ESOPHAGOGASTRODUODENOSCOPY (EGD) WITH PROPOFOL N/A 01/06/2018   Procedure: ESOPHAGOGASTRODUODENOSCOPY (EGD) WITH PROPOFOL;  Surgeon: Otis Brace, MD;  Location: [facility];  Service: Gastroenterology;  Laterality: N/A;   ESOPHAGOGASTRODUODENOSCOPY (EGD) WITH PROPOFOL N/A 08/24/2021   Procedure: ESOPHAGOGASTRODUODENOSCOPY (EGD) WITH PROPOFOL;  Surgeon: Milus Banister, MD;  Location: [facility];  Service: Endoscopy;  Laterality: N/A;   FOOT SURGERY Right    x 2   Family History  Problem Relation Age of Onset   Hypertension Mother    Heart attack Mother    Hypertension Father    Colon cancer Neg Hx    Esophageal cancer Neg Hx    Pancreatic cancer Neg Hx    Stomach cancer Neg Hx    Social History   Tobacco Use   Smoking status: Former    Types: Cigarettes   Smokeless tobacco: Never   Tobacco comments:    quit 72 years ago  Vaping Use   Vaping Use: Never used  Substance Use Topics   Alcohol use: No   Drug use: No   Current Outpatient Medications  Medication Sig Dispense Refill   Cholecalciferol (VITAMIN D) 2000 units tablet Take 2,000 Units by mouth daily.     diltiazem (CARDIZEM) 30 MG tablet TAKE 1 TABLET AS NEEDED FOR PALPITATIONS OR HEART RATE >100 30 tablet 6   diltiazem (TIADYLT ER) 120 MG 24 hr capsule Take 1 capsule (120 mg total) by mouth 2 (two) times daily. 180 capsule 1   diphenhydramine-acetaminophen (TYLENOL PM) 25-500 MG TABS tablet Take 0.5 tablets by mouth at bedtime as needed (sleep).     ferrous sulfate 325 (65 FE) MG tablet Take 325 mg by mouth daily with supper.     furosemide (LASIX) 20 MG tablet Take 20 mg by mouth daily as needed  for fluid.     levothyroxine (SYNTHROID) 125 MCG tablet Take 125 mcg by mouth daily before breakfast.      pantoprazole (PROTONIX) 40 MG tablet Take 40 mg by mouth 4 (four) times a week.     Polyethyl Glycol-Propyl Glycol (SYSTANE OP) Place 1 drop into both eyes daily as needed (dry eyes).     tamsulosin (FLOMAX) 0.4 MG CAPS capsule Take 0.8 mg by mouth daily.     tobramycin (TOBREX) 0.3 % ophthalmic solution Place 1 drop into the left eye See admin instructions. Instill 1 drop into left eye 1 day before, the day of, and the day after eye injections administered at Dr's office     triamcinolone cream (KENALOG) 0.1 % Apply 1 application topically 2 (two) times daily. 30 g 0   valsartan (DIOVAN) 320 MG tablet TAKE 1 TABLET BY MOUTH EVERY DAY 90 tablet 3   warfarin (COUMADIN) 5 MG tablet Take 5-7.5 mg by mouth See admin instructions. Per patient Sunday, Harrell Lark, & Saturday taking 7.5 mg = 1 & 1/2 tab. All other days taking 1  tab = 5mg  mg on  Mon, Wed, Friday     No current facility-administered medications for this visit.   Allergies  Allergen Reactions   Carvedilol Shortness Of Breath    wheezing   Lopid [Gemfibrozil] Other (See Comments)    transaminitis   Monopril [Fosinopril] Other (See Comments)    transaminitis   Vicodin [Hydrocodone-Acetaminophen]     Severe sensitivity   Phenergan [Promethazine Hcl] Other (See Comments)    Severe somnolence.   Zocor [Simvastatin]     Short memory loss.   Benicar [Olmesartan]     Abdominal cramping and increased stools/ irritable bowel   Codeine Nausea And Vomiting   Prilosec [Omeprazole]     dyspepsia     Review of Systems: All systems reviewed and negative except where noted in HPI.   Lab Results  Component Value Date   WBC 5.1 09/12/2022   HGB 13.1 09/12/2022   HCT 38.8 (L) 09/12/2022   MCV 103.1 (H) 09/12/2022   PLT 193.0 09/12/2022    Lab Results  Component Value Date   CREATININE 1.39 09/12/2022   BUN 27 (H) 09/12/2022    NA 139 09/12/2022   K 4.5 09/12/2022   CL 106 09/12/2022   CO2 22 09/12/2022    Lab Results  Component Value Date   ALT 20 09/12/2022   AST 49 (H) 09/12/2022   ALKPHOS 81 09/12/2022   BILITOT 1.7 (H) 09/12/2022     Physical Exam: BP (!) 144/62   Pulse 65   Ht 5\' 10"  (1.778 m)   Wt 184 lb (83.5 kg)   BMI 26.40 kg/m  Constitutional: Pleasant,well-developed, male in no acute distress. Neurological: Alert and oriented to person place and time. Extremities - mild LE edema bilaterally lower legs Psychiatric: Normal mood and affect. Behavior is normal.   ASSESSMENT: 89 y.o. male here for assessment of the following  1. Cirrhosis of liver without ascites, unspecified hepatic cirrhosis type ([place])   2. Gastroesophageal reflux disease, unspecified whether esophagitis present   3. Long-term current use of proton pump inhibitor therapy    History of cryptogenic cirrhosis, has never pursued a liver biopsy.  He has been remarkably compensated since his diagnosis.  His labs look good, recent [place] screening up-to-date and normal.  Had a good discussion with the patient and his wife today about his liver disease, risks for decompensation and risks for [facility].  Hopefully this liver disease is nothing that will cause him problems in the future given he has been so compensated so far.  His INR is not able to be followed in regards to his liver disease given he is on Coumadin.  His EGD is up-to-date without varices, hopefully risk for bleeding on Coumadin is low, he maintains Protonix for prophylaxis given history of ulcer.  This also controls his GERD.  We discussed risks of Protonix, he is using it intermittently and with good control, I would continue that for now.  We will continue his labs and right upper quadrant ultrasound every 6 months.  Do not feel strongly he needs any further variceal screening at his age, unless he develops worsening thrombocytopenia concerning for portal hypertension etc.,   and higher risk for bleeding with Coumadin use. We will continue to see him every 6 months.   PLAN: - CBC, CMET, AFP in July - RUQ [place] for [facility] screening in July - continue protonix - he reports 3 days per week - understands risks. I think benefits > risks - follow  up in 6 months  Jolly Mango, MD [facility]

## 2022-11-21 DIAGNOSIS — Z7901 Long term (current) use of anticoagulants: Secondary | ICD-10-CM | POA: Diagnosis not present

## 2022-11-27 ENCOUNTER — Telehealth: Payer: Self-pay

## 2022-11-27 NOTE — Telephone Encounter (Signed)
Error

## 2022-12-18 DIAGNOSIS — Z7901 Long term (current) use of anticoagulants: Secondary | ICD-10-CM | POA: Diagnosis not present

## 2022-12-19 DIAGNOSIS — H35372 Puckering of macula, left eye: Secondary | ICD-10-CM | POA: Diagnosis not present

## 2022-12-19 DIAGNOSIS — H353221 Exudative age-related macular degeneration, left eye, with active choroidal neovascularization: Secondary | ICD-10-CM | POA: Diagnosis not present

## 2022-12-19 DIAGNOSIS — H353111 Nonexudative age-related macular degeneration, right eye, early dry stage: Secondary | ICD-10-CM | POA: Diagnosis not present

## 2022-12-19 DIAGNOSIS — H348312 Tributary (branch) retinal vein occlusion, right eye, stable: Secondary | ICD-10-CM | POA: Diagnosis not present

## 2022-12-26 DIAGNOSIS — H353221 Exudative age-related macular degeneration, left eye, with active choroidal neovascularization: Secondary | ICD-10-CM | POA: Diagnosis not present

## 2023-01-16 DIAGNOSIS — Z7901 Long term (current) use of anticoagulants: Secondary | ICD-10-CM | POA: Diagnosis not present

## 2023-01-24 ENCOUNTER — Other Ambulatory Visit: Payer: Self-pay | Admitting: Cardiology

## 2023-01-24 DIAGNOSIS — M79671 Pain in right foot: Secondary | ICD-10-CM | POA: Diagnosis not present

## 2023-01-29 ENCOUNTER — Other Ambulatory Visit (HOSPITAL_COMMUNITY): Payer: Self-pay | Admitting: *Deleted

## 2023-01-29 MED ORDER — DILTIAZEM HCL ER BEADS 120 MG PO CP24: 120.0000 mg | ORAL_CAPSULE | Freq: Two times a day (BID) | ORAL | 2 refills | Status: DC

## 2023-02-07 ENCOUNTER — Ambulatory Visit (HOSPITAL_COMMUNITY)
Admission: RE | Admit: 2023-02-07 | Discharge: 2023-02-07 | Disposition: A | Discharge status: Home / Self Care | Payer: Medicare Other | Source: Ambulatory Visit | Attending: Physician Assistant | Admitting: Physician Assistant

## 2023-02-07 VITALS — BP 170/60 | HR 75 | Ht 70.0 in | Wt 179.4 lb

## 2023-02-07 DIAGNOSIS — D6869 Other thrombophilia: Secondary | ICD-10-CM

## 2023-02-07 DIAGNOSIS — I2510 Atherosclerotic heart disease of native coronary artery without angina pectoris: Secondary | ICD-10-CM | POA: Diagnosis not present

## 2023-02-07 DIAGNOSIS — I517 Cardiomegaly: Secondary | ICD-10-CM | POA: Insufficient documentation

## 2023-02-07 DIAGNOSIS — I447 Left bundle-branch block, unspecified: Secondary | ICD-10-CM | POA: Diagnosis not present

## 2023-02-07 DIAGNOSIS — D6859 Other primary thrombophilia: Secondary | ICD-10-CM | POA: Insufficient documentation

## 2023-02-07 DIAGNOSIS — I10 Essential (primary) hypertension: Secondary | ICD-10-CM | POA: Insufficient documentation

## 2023-02-07 DIAGNOSIS — I4892 Unspecified atrial flutter: Secondary | ICD-10-CM | POA: Diagnosis not present

## 2023-02-07 DIAGNOSIS — I4819 Other persistent atrial fibrillation: Secondary | ICD-10-CM | POA: Insufficient documentation

## 2023-02-07 NOTE — Progress Notes (Signed)
Primary Care Physician: Joya Martyr, MD Primary Cardiologist: Dr Katrinka Blazing (new to Dr Anne Fu) Primary Electrophysiologist: Dr Elberta Fortis Referring Physician: [facility]   Jason Santana is a 89 y.o. male with a history of CAD, HTN, mechanical AV (2000),  atrial fibrillation, and atrial flutter who presents for follow up in the [facility]. He is s/p afib and flutter ablation with Dr Elberta Fortis on 05/22/19.   On follow up today, patient reports that he has done well since his last visit. He has not had any symptoms of afib. No bleeding issues on anticoagulation.       Today, he denies symptoms of palpitations, chest pain, shortness of breath, orthopnea, PND, lower extremity edema, dizziness, presyncope, syncope, snoring, daytime somnolence, bleeding, or neurologic sequela. The patient is tolerating medications without difficulties and is otherwise without complaint today.    Atrial Fibrillation Risk Factors:  he does not have symptoms or diagnosis of sleep apnea.  he has a BMI of Body mass index is 25.74 kg/m.[name] Filed Weights   02/07/23 0841  Weight: 81.4 kg   Family History  Problem Relation Age of Onset   Hypertension Mother    Heart attack Mother    Hypertension Father    Colon cancer Neg Hx    Esophageal cancer Neg Hx    Pancreatic cancer Neg Hx    Stomach cancer Neg Hx      Atrial Fibrillation Management history: Previous antiarrhythmic drugs: amiodarone (stopped 2/2 elevated LFTs and TSH) Previous cardioversions: 2019 x2 Previous ablations: 05/22/19 fib and flutter Anticoagulation history: warfarin   Past Medical History:  Diagnosis Date   Anticoagulation adequate 05/23/2019   Atrial fibrillation ([facility])    CAD (coronary artery disease) of artery bypass graft 2000   2000 with multiple bypasses    Chronic anticoagulation    Coumadin therapy for mechanical aortic valve. Postoperative atrial fibrillation.    Essential hypertension    H/O  mechanical aortic valve replacement 2000   St Jude AVR   Hypothyroidism    Premature ventricular contractions    S/P ablation of atrial fibrillation 05/22/19 05/23/2019   Typical atrial flutter ([facility]) 05/23/2019   Past Surgical History:  Procedure Laterality Date   AORTIC VALVE REPLACEMENT (AVR)/CORONARY ARTERY BYPASS GRAFTING (CABG)  2000   ATRIAL FIBRILLATION ABLATION N/A 05/22/2019   Procedure: ATRIAL FIBRILLATION ABLATION;  Surgeon: Regan Lemming, MD;  Location: [facility];  Service: Cardiovascular;  Laterality: N/A;   BIOPSY  08/24/2021   Procedure: BIOPSY;  Surgeon: Rachael Fee, MD;  Location: [facility];  Service: Endoscopy;;   CARDIOVERSION N/A 02/24/2018   Procedure: CARDIOVERSION;  Surgeon: Pricilla Riffle, MD;  Location: [facility] ENDOSCOPY;  Service: Cardiovascular;  Laterality: N/A;   CARDIOVERSION N/A 08/15/2018   Procedure: CARDIOVERSION;  Surgeon: Vesta Mixer, MD;  Location: [facility] ENDOSCOPY;  Service: Cardiovascular;  Laterality: N/A;   COLONOSCOPY WITH PROPOFOL N/A 01/08/2018   Procedure: COLONOSCOPY WITH PROPOFOL;  Surgeon: Willis Modena, MD;  Location: [facility];  Service: Gastroenterology;  Laterality: N/A;   CORONARY/GRAFT ANGIOGRAPHY N/A 08/14/2018   Procedure: CORONARY/GRAFT ANGIOGRAPHY;  Surgeon: Tonny Bollman, MD;  Location: [facility] INVASIVE CV LAB;  Service: Cardiovascular;  Laterality: N/A;   ESOPHAGOGASTRODUODENOSCOPY (EGD) WITH PROPOFOL N/A 01/06/2018   Procedure: ESOPHAGOGASTRODUODENOSCOPY (EGD) WITH PROPOFOL;  Surgeon: Kathi Der, MD;  Location: [facility];  Service: Gastroenterology;  Laterality: N/A;   ESOPHAGOGASTRODUODENOSCOPY (EGD) WITH PROPOFOL N/A 08/24/2021   Procedure: ESOPHAGOGASTRODUODENOSCOPY (EGD) WITH PROPOFOL;  Surgeon: Rachael Fee, MD;  Location: [facility];  Service: Endoscopy;  Laterality: N/A;   FOOT SURGERY Right    x 2    Current Outpatient Medications  Medication Sig Dispense Refill   Cholecalciferol  (VITAMIN D) 2000 units tablet Take 2,000 Units by mouth daily.     diltiazem (CARDIZEM) 30 MG tablet TAKE 1 TABLET AS NEEDED FOR PALPITATIONS OR HEART RATE >100 30 tablet 6   diltiazem (TIADYLT ER) 120 MG 24 hr capsule Take 1 capsule (120 mg total) by mouth 2 (two) times daily. 180 capsule 2   diphenhydramine-acetaminophen (TYLENOL PM) 25-500 MG TABS tablet Take 0.5 tablets by mouth at bedtime as needed (sleep).     ferrous sulfate 325 (65 FE) MG tablet Take 325 mg by mouth daily with supper.     furosemide (LASIX) 20 MG tablet Take 20 mg by mouth daily as needed for fluid.     levothyroxine (SYNTHROID) 125 MCG tablet Take 125 mcg by mouth daily before breakfast.      pantoprazole (PROTONIX) 40 MG tablet Take 40 mg by mouth 4 (four) times a week.     Polyethyl Glycol-Propyl Glycol (SYSTANE OP) Place 1 drop into both eyes daily as needed (dry eyes).     tamsulosin (FLOMAX) 0.4 MG CAPS capsule Take 0.8 mg by mouth daily.     tobramycin (TOBREX) 0.3 % ophthalmic solution Place 1 drop into the left eye See admin instructions. Instill 1 drop into left eye 1 day before, the day of, and the day after eye injections administered at Dr's office     triamcinolone cream (KENALOG) 0.1 % Apply 1 application topically 2 (two) times daily. 30 g 0   valsartan (DIOVAN) 320 MG tablet TAKE 1 TABLET BY MOUTH EVERY DAY 90 tablet 3   warfarin (COUMADIN) 5 MG tablet Take 5-7.5 mg by mouth See admin instructions. Per patient Sunday, [name], & Saturday taking 7.5 mg = 1 & 1/2 tab. All other days taking 1  tab = 5mg  mg on  Mon, Wed, Friday     No current facility-administered medications for this encounter.    Allergies  Allergen Reactions   Carvedilol Shortness Of Breath    wheezing   Lopid [Gemfibrozil] Other (See Comments)    transaminitis   Monopril [Fosinopril] Other (See Comments)    transaminitis   Vicodin [Hydrocodone-Acetaminophen]     Severe sensitivity   Phenergan [Promethazine Hcl] Other (See  Comments)    Severe somnolence.   Zocor [Simvastatin]     Short memory loss.   Benicar [Olmesartan]     Abdominal cramping and increased stools/ irritable bowel   Codeine Nausea And Vomiting   Prilosec [Omeprazole]     dyspepsia    Social History   Socioeconomic History   Marital status: Married    Spouse name: Not on file   Number of children: Not on file   Years of education: Not on file   Highest education level: Not on file  Occupational History   Not on file  Tobacco Use   Smoking status: Former    Types: Cigarettes   Smokeless tobacco: Never   Tobacco comments:    quit 57 years ago  Vaping Use   Vaping Use: Never used  Substance and Sexual Activity   Alcohol use: No   Drug use: No   Sexual activity: Not on file  Other Topics Concern   Not on file  Social History Narrative   Not on file  Social Determinants of Health   Financial Resource Strain: Not on file  Food Insecurity: No Food Insecurity (03/28/2022)   Hunger Vital Sign    Worried About Running Out of Food in the Last Year: Never true    Ran Out of Food in the Last Year: Never true  Transportation Needs: No Transportation Needs (03/28/2022)   PRAPARE - Administrator, Civil Service (Medical): No    Lack of Transportation (Non-Medical): No  Physical Activity: Not on file  Stress: Not on file  Social Connections: Not on file  Intimate Partner Violence: Not on file     ROS- All systems are reviewed and negative except as per the HPI above.  Physical Exam: Vitals:   02/07/23 0841  BP: (!) 170/60  Pulse: 75  Weight: 81.4 kg  Height: 5\' 10"  (1.778 m)    GEN- The patient is a well appearing elderly male, alert and oriented x 3 today.   HEENT-head normocephalic, atraumatic, sclera clear, conjunctiva pink, hearing intact, trachea midline. Lungs- Clear to ausculation bilaterally, normal work of breathing Heart- Regular rate and rhythm, no rubs or gallops, 2/6 systolic murmur + mech  click GI- soft, NT, ND, + BS Extremities- no clubbing, cyanosis, or edema MS- no significant deformity or atrophy Skin- no rash or lesion Psych- euthymic mood, full affect Neuro- strength and sensation are intact   Wt Readings from Last 3 Encounters:  02/07/23 81.4 kg  11/16/22 83.5 kg  11/14/22 83.9 kg    EKG today demonstrates   SR, LBBB (similar to ECGs in 2022) Vent. rate 75 BPM PR interval 194 ms QRS duration 128 ms QT/QTcB 418/466 ms   Echo 08/12/18 demonstrated  - Left ventricle: The cavity size was normal. There was mild   concentric hypertrophy. Systolic function was normal. The   estimated ejection fraction was in the range of 55% to 60%. Wall   motion was normal; there were no regional wall motion   abnormalities. - Ventricular septum: Septal motion showed paradox. These changes   are consistent with a post-thoracotomy state. - Aortic valve: A mechanical prosthesis was present and functioning   normally. - Mitral valve: Calcified annulus. There was mild regurgitation. - Left atrium: The atrium was mildly dilated. - Right ventricle: Systolic function was mildly to moderately   reduced. - Right atrium: The atrium was mildly dilated.  Epic records are reviewed at length today    CHA2DS2-VASc Score = 5  The patient's score is based upon: CHF History: 1 HTN History: 1 Diabetes History: 0 Stroke History: 0 Vascular Disease History: 1 Age Score: 2 Gender Score: 0       ASSESSMENT AND PLAN: 1. Persistent Atrial Fibrillation/atrial flutter The patient's CHA2DS2-VASc score is 5, indicating a 7.2% annual risk of stroke.   S/p afib and flutter ablation with Dr Elberta Fortis 05/22/19. Patient appears to be maintaining SR.  Continue diltiazem 120 mg BID  Continue warfarin  2. Secondary Hypercoagulable State (ICD10:  D68.69) The patient is at significant risk for stroke/thromboembolism based upon his CHA2DS2-VASc Score of 5.  Continue Warfarin (Coumadin).   3.  CAD S/p remote CABG No anginal symptoms.  4. HTN Elevated here today, was 120 systolic at home this AM. No changes today.   5. AVR (mechanical)  Continue warfarin    Follow up in the AF clinic in one year. Follow up with Jacolyn Reedy and Dr Anne Fu per recalls.    [name] PA-C [facility] [address]  [address] [place], [place] [address] [phone]

## 2023-02-13 DIAGNOSIS — Z7901 Long term (current) use of anticoagulants: Secondary | ICD-10-CM | POA: Diagnosis not present

## 2023-02-22 DIAGNOSIS — H353111 Nonexudative age-related macular degeneration, right eye, early dry stage: Secondary | ICD-10-CM | POA: Diagnosis not present

## 2023-02-22 DIAGNOSIS — H35372 Puckering of macula, left eye: Secondary | ICD-10-CM | POA: Diagnosis not present

## 2023-02-22 DIAGNOSIS — H348312 Tributary (branch) retinal vein occlusion, right eye, stable: Secondary | ICD-10-CM | POA: Diagnosis not present

## 2023-02-22 DIAGNOSIS — H353221 Exudative age-related macular degeneration, left eye, with active choroidal neovascularization: Secondary | ICD-10-CM | POA: Diagnosis not present

## 2023-03-01 DIAGNOSIS — M722 Plantar fascial fibromatosis: Secondary | ICD-10-CM | POA: Diagnosis not present

## 2023-03-01 DIAGNOSIS — M6701 Short Achilles tendon (acquired), right ankle: Secondary | ICD-10-CM | POA: Diagnosis not present

## 2023-03-01 DIAGNOSIS — T8484XA Pain due to internal orthopedic prosthetic devices, implants and grafts, initial encounter: Secondary | ICD-10-CM | POA: Diagnosis not present

## 2023-03-04 ENCOUNTER — Other Ambulatory Visit: Payer: Self-pay

## 2023-03-04 ENCOUNTER — Telehealth: Payer: Self-pay

## 2023-03-04 DIAGNOSIS — K7460 Unspecified cirrhosis of liver: Secondary | ICD-10-CM

## 2023-03-04 NOTE — Telephone Encounter (Signed)
Called patient and talked to his wife to notify him of his upcoming RUQ ultrasound on 03/08/2023 at 10:30am at [facility] long, and future labs he needs. Patients wife verbalized understanding.

## 2023-03-08 ENCOUNTER — Ambulatory Visit (HOSPITAL_COMMUNITY)
Admission: RE | Admit: 2023-03-08 | Discharge: 2023-03-08 | Disposition: A | Discharge status: Home / Self Care | Payer: Medicare Other | Source: Ambulatory Visit | Attending: Gastroenterology | Admitting: Gastroenterology

## 2023-03-08 ENCOUNTER — Encounter: Payer: Self-pay | Admitting: Gastroenterology

## 2023-03-08 ENCOUNTER — Other Ambulatory Visit (INDEPENDENT_AMBULATORY_CARE_PROVIDER_SITE_OTHER): Payer: Medicare Other

## 2023-03-08 DIAGNOSIS — Z9049 Acquired absence of other specified parts of digestive tract: Secondary | ICD-10-CM | POA: Diagnosis not present

## 2023-03-08 DIAGNOSIS — K7460 Unspecified cirrhosis of liver: Secondary | ICD-10-CM | POA: Diagnosis not present

## 2023-03-08 DIAGNOSIS — K7689 Other specified diseases of liver: Secondary | ICD-10-CM | POA: Diagnosis not present

## 2023-03-08 LAB — COMPREHENSIVE METABOLIC PANEL
ALT: 17 U/L (ref 0–53)
AST: 44 U/L — ABNORMAL HIGH (ref 0–37)
Albumin: 3.7 g/dL (ref 3.5–5.2)
Alkaline Phosphatase: 87 U/L (ref 39–117)
BUN: 18 mg/dL (ref 6–23)
CO2: 21 mEq/L (ref 19–32)
Calcium: 9.7 mg/dL (ref 8.4–10.5)
Chloride: 103 mEq/L (ref 96–112)
Creatinine, Ser: 1.32 mg/dL (ref 0.40–1.50)
GFR: 48.12 mL/min — ABNORMAL LOW (ref >60.00)
Glucose, Bld: 110 mg/dL — ABNORMAL HIGH (ref 70–99)
Potassium: 4.5 mEq/L (ref 3.5–5.1)
Sodium: 132 mEq/L — ABNORMAL LOW (ref 135–145)
Total Bilirubin: 1.4 mg/dL — ABNORMAL HIGH (ref 0.2–1.2)
Total Protein: 7.6 g/dL (ref 6.0–8.3)

## 2023-03-08 LAB — CBC
HCT: 36.1 % — ABNORMAL LOW (ref 39.0–52.0)
Hemoglobin: 12 g/dL — ABNORMAL LOW (ref 13.0–17.0)
MCHC: 33.3 g/dL (ref 30.0–36.0)
MCV: 101.9 fl — ABNORMAL HIGH (ref 78.0–100.0)
Platelets: 225 10*3/uL (ref 150.0–400.0)
RBC: 3.54 Mil/uL — ABNORMAL LOW (ref 4.22–5.81)
RDW: 13.4 % (ref 11.5–15.5)
WBC: 6.1 10*3/uL (ref 4.0–10.5)

## 2023-03-11 LAB — AFP TUMOR MARKER: AFP-Tumor Marker: 4.8 ng/mL (ref <6.1)

## 2023-03-13 DIAGNOSIS — Z7901 Long term (current) use of anticoagulants: Secondary | ICD-10-CM | POA: Diagnosis not present

## 2023-03-22 DIAGNOSIS — M79671 Pain in right foot: Secondary | ICD-10-CM | POA: Diagnosis not present

## 2023-04-10 DIAGNOSIS — Z7901 Long term (current) use of anticoagulants: Secondary | ICD-10-CM | POA: Diagnosis not present

## 2023-04-16 NOTE — Progress Notes (Addendum)
Cardiology Office Note:  .   Date:  04/30/2023  ID:  Jason Santana, DOB 1932/01/09, MRN [id number] PCP: Joya Martyr, MD  [facility] Providers Cardiologist:  Donato Schultz, MD Electrophysiologist:  Regan Lemming, MD    History of Present Illness: .   Jason Santana is a 89 y.o. male  hx of mechanical aortic valve replacement (2000), paroxysmal atrial fibrillation and atrial flutter and ablation 2020, chronic anticoagulation therapy, coronary artery disease with remote CABG, lower GI bleed May 2019 required pausing of anticoagulation therapy.  Electrical cardioversion performed June 2019, August 15, 2018, and October 22, 2018 prior to  A. fib/flutter ablation September 2020 ([place])--> NSR.  Patient comes in for regular f/u. No cardiac complaints. No chest pain, dyspnea, palpitations. BP 128/58 at home this am. 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   ROS:    Studies Reviewed: [name]       Chest CT scan 05/2021: IMPRESSION: 1. Stable top-normal/mildly enlarged ascending thoracic aorta measuring 3.9 cm by CTA today. 2. Stable chronic lung disease. 3. Nodular contour of liver likely reflecting underlying cirrhosis.   Aortic Atherosclerosis (ICD10-I70.0) and Emphysema (ICD10-J43.9).   08/24/22 ECHO  1. S/P AVR with mean gradient 14 mmHg and no AI.   2. Left ventricular ejection fraction, by estimation, is 60 to 65%. The  left ventricle has normal function. The left ventricle has no regional  wall motion abnormalities. There is mild concentric left ventricular  hypertrophy. Left ventricular diastolic  parameters are indeterminate.   3. Right ventricular systolic function is normal. The right ventricular  size is normal.   4. The mitral valve is normal in structure. Trivial mitral valve  regurgitation. No evidence of mitral stenosis. Severe mitral annular  calcification.   5. The aortic valve has been repaired/replaced. Aortic valve   regurgitation is not visualized. No aortic stenosis is present. There is a  St. Jude mechanical valve present in the aortic position.   6. Aortic dilatation noted. There is mild dilatation of the ascending  aorta, measuring 43 mm.    TTE 08/12/18  Review of the above records today demonstrates:  - Left ventricle: The cavity size was normal. There was mild   concentric hypertrophy. Systolic function was normal. The   estimated ejection fraction was in the range of 55% to 60%. Wall   motion was normal; there were no regional wall motion   abnormalities. - Ventricular septum: Septal motion showed paradox. These changes   are consistent with a post-thoracotomy state. - Aortic valve: A mechanical prosthesis was present and functioning   normally. - Mitral valve: Calcified annulus. There was mild regurgitation. - Left atrium: The atrium was mildly dilated. - Right ventricle: Systolic function was mildly to moderately   reduced. - Right atrium: The atrium was mildly dilated.   LHC 08/14/18 Mid RCA lesion is 100% stenosed. Ost 2nd Mrg lesion is 100% stenosed. Prox LAD to Mid LAD lesion is 50% stenosed. SVG. The graft exhibits mild .  1.  Severe two-vessel coronary artery disease with total occlusion of the OM 2 and the mid RCA, nonobstructive LAD stenosis 2.  Status post remote aortocoronary bypass surgery with continued patency of the LIMA to LAD, saphenous vein graft OM 2, and saphenous vein graft to PDA          Prior CV Studies: ECHO COMPLETE WO IMAGING ENHANCING AGENT 08/24/2022  Narrative ECHOCARDIOGRAM REPORT    Patient Name:  Jason Santana  Date of Exam: 08/24/2022 Medical Rec #:  [id number]     Height:       68.5 in Accession #:    [id number]    Weight:       183.0 lb Date of Birth:  12-Nov-1931    BSA:          1.979 m Patient Age:    89 years      BP:           138/68 mmHg Patient Gender: M             HR:           70 bpm. Exam Location:  [address]  Procedure: 2D Echo, Cardiac Doppler and Color Doppler  Indications:    Z95.2 S/P aortic valve replacement  History:        Patient has prior history of Echocardiogram examinations, most recent 08/12/2018. CAD, Prior CABG, Arrythmias:Atrial Fibrillation and Atrial Flutter, Signs/Symptoms:Edema and Murmur; Risk Factors:Hypertension. Mechanical St. Jude aortic valve. Ablation. Aortic atherosclerosis. Lower extremity edema. Aortic Valve: St. Jude mechanical valve is present in the aortic position.  Sonographer:    Cathie Beams RCS Referring Phys: Dyann Kief  IMPRESSIONS   1. S/P AVR with mean gradient 14 mmHg and no AI. 2. Left ventricular ejection fraction, by estimation, is 60 to 65%. The left ventricle has normal function. The left ventricle has no regional wall motion abnormalities. There is mild concentric left ventricular hypertrophy. Left ventricular diastolic parameters are indeterminate. 3. Right ventricular systolic function is normal. The right ventricular size is normal. 4. The mitral valve is normal in structure. Trivial mitral valve regurgitation. No evidence of mitral stenosis. Severe mitral annular calcification. 5. The aortic valve has been repaired/replaced. Aortic valve regurgitation is not visualized. No aortic stenosis is present. There is a St. Jude mechanical valve present in the aortic position. 6. Aortic dilatation noted. There is mild dilatation of the ascending aorta, measuring 43 mm.  FINDINGS Left Ventricle: Left ventricular ejection fraction, by estimation, is 60 to 65%. The left ventricle has normal function. The left ventricle has no regional wall motion abnormalities. The left ventricular internal cavity size was normal in size. There is mild concentric left ventricular hypertrophy. Left ventricular diastolic parameters are indeterminate.  Right Ventricle: The right ventricular size is normal. Right ventricular systolic function is  normal.  Left Atrium: Left atrial size was normal in size.  Right Atrium: Right atrial size was normal in size.  Pericardium: There is no evidence of pericardial effusion.  Mitral Valve: The mitral valve is normal in structure. Severe mitral annular calcification. Trivial mitral valve regurgitation. No evidence of mitral valve stenosis. MV peak gradient, 16.6 mmHg. The mean mitral valve gradient is 6.0 mmHg.  Tricuspid Valve: The tricuspid valve is normal in structure. Tricuspid valve regurgitation is mild . No evidence of tricuspid stenosis.  Aortic Valve: The aortic valve has been repaired/replaced. Aortic valve regurgitation is not visualized. No aortic stenosis is present. Aortic valve mean gradient measures 14.3 mmHg. Aortic valve peak gradient measures 32.0 mmHg. There is a St. Jude mechanical valve present in the aortic position.  Pulmonic Valve: The pulmonic valve was normal in structure. Pulmonic valve regurgitation is trivial. No evidence of pulmonic stenosis.  Aorta: Aortic dilatation noted. There is mild dilatation of the ascending aorta, measuring 43 mm.  Venous: The inferior vena cava was not well visualized.  IAS/Shunts: No atrial level shunt detected by color flow  Doppler.  Additional Comments: S/P AVR with mean gradient 14 mmHg and no AI.   LEFT VENTRICLE PLAX 2D LVIDd:         4.30 cm Diastology LVIDs:         2.95 cm LV e' medial:    6.74 cm/s LV PW:         1.10 cm LV E/e' medial:  28.5 LV IVS:        1.20 cm LV e' lateral:   10.20 cm/s LV E/e' lateral: 18.8   RIGHT VENTRICLE RV Basal diam:  3.20 cm RV S prime:     8.70 cm/s TAPSE (M-mode): 1.5 cm RVSP:           47.6 mmHg  LEFT ATRIUM             Index        RIGHT ATRIUM           Index LA diam:        4.10 cm 2.07 cm/m   RA Pressure: 3.00 mmHg LA Vol (A2C):   53.1 ml 26.84 ml/m  RA Area:     17.40 cm LA Vol (A4C):   39.4 ml 19.91 ml/m  RA Volume:   44.60 ml  22.54 ml/m LA Biplane Vol: 47.5 ml  24.01 ml/m AORTIC VALVE AV Vmax:           282.67 cm/s AV Vmean:          171.000 cm/s AV VTI:            0.557 m AV Peak Grad:      32.0 mmHg AV Mean Grad:      14.3 mmHg LVOT Vmax:         127.67 cm/s LVOT Vmean:        77.867 cm/s LVOT VTI:          0.276 m LVOT/AV VTI ratio: 0.50  AORTA Ao Asc diam: 4.30 cm  MITRAL VALVE                TRICUSPID VALVE MV Area (PHT): 2.62 cm     TR Peak grad:   44.6 mmHg MV Peak grad:  16.6 mmHg    TR Vmax:        334.00 cm/s MV Mean grad:  6.0 mmHg     Estimated RAP:  3.00 mmHg MV Vmax:       2.04 m/s     RVSP:           47.6 mmHg MV Vmean:      110.0 cm/s MV Decel Time: 290 msec     SHUNTS MV E velocity: 192.00 cm/s  Systemic VTI: 0.28 m MV A velocity: 106.00 cm/s MV E/A ratio:  1.81  Olga Millers MD Electronically signed by Olga Millers MD Signature Date/Time: 08/24/2022/3:23:40 PM    Final       Risk Assessment/Calculations:    CHA2DS2-VASc Score = 5   This indicates a 7.2% annual risk of stroke. The patient's score is based upon: CHF History: 1 HTN History: 1 Diabetes History: 0 Stroke History: 0 Vascular Disease History: 1 Age Score: 2 Gender Score: 0            Physical Exam:   VS:  BP (!) 130/58   Pulse 65   Ht 5\' 10"  (1.778 m)   Wt 180 lb 3.2 oz (81.7 kg)   SpO2 97%   BMI 25.86 kg/m    Wt Readings from  Last 3 Encounters:  04/30/23 180 lb 3.2 oz (81.7 kg)  02/07/23 179 lb 6.4 oz (81.4 kg)  11/16/22 184 lb (83.5 kg)    GEN: Well nourished, well developed in no acute distress NECK: No JVD; No carotid bruits CARDIAC:  RRR, crisp valves RESPIRATORY:  Clear to auscultation without rales, wheezing or rhonchi  ABDOMEN: Soft, non-tender, non-distended EXTREMITIES:  No edema; No deformity   ASSESSMENT AND PLAN: .       Paroxysmal atrial fibrillation: Currently on warfarin.  Status post ablation 05/22/2019.  CHA2DS2-VASc of 5.  No further episodes maintaining NSR      Mechanical aortic valve  replacement 2020 : last echo 08/2022 stable. Coumadin managed by PCP   Coronary artery disease remote CABG: No current chest pain    Hypertension: controlled on valsartan, diltiazem, prn lasix.   Aortic aneurysm: 4.3 cm echo 08/2022     Secondary hypercoagulable state: Currently on warfarin for atrial fibrillation as above        Dispo:    Signed, Jacolyn Reedy, PA-C

## 2023-04-23 DIAGNOSIS — H04123 Dry eye syndrome of bilateral lacrimal glands: Secondary | ICD-10-CM | POA: Diagnosis not present

## 2023-04-23 DIAGNOSIS — H348312 Tributary (branch) retinal vein occlusion, right eye, stable: Secondary | ICD-10-CM | POA: Diagnosis not present

## 2023-04-23 DIAGNOSIS — H35372 Puckering of macula, left eye: Secondary | ICD-10-CM | POA: Diagnosis not present

## 2023-04-23 DIAGNOSIS — H353111 Nonexudative age-related macular degeneration, right eye, early dry stage: Secondary | ICD-10-CM | POA: Diagnosis not present

## 2023-04-23 DIAGNOSIS — H353221 Exudative age-related macular degeneration, left eye, with active choroidal neovascularization: Secondary | ICD-10-CM | POA: Diagnosis not present

## 2023-04-30 ENCOUNTER — Ambulatory Visit: Payer: Medicare Other | Attending: Physician Assistant | Admitting: Physician Assistant

## 2023-04-30 ENCOUNTER — Encounter: Payer: Self-pay | Admitting: Physician Assistant

## 2023-04-30 VITALS — BP 130/58 | HR 65 | Ht 70.0 in | Wt 180.2 lb

## 2023-04-30 DIAGNOSIS — D6869 Other thrombophilia: Secondary | ICD-10-CM

## 2023-04-30 DIAGNOSIS — I10 Essential (primary) hypertension: Secondary | ICD-10-CM | POA: Diagnosis not present

## 2023-04-30 DIAGNOSIS — I2510 Atherosclerotic heart disease of native coronary artery without angina pectoris: Secondary | ICD-10-CM | POA: Diagnosis not present

## 2023-04-30 DIAGNOSIS — I7121 Aneurysm of the ascending aorta, without rupture: Secondary | ICD-10-CM

## 2023-04-30 DIAGNOSIS — Z952 Presence of prosthetic heart valve: Secondary | ICD-10-CM | POA: Diagnosis not present

## 2023-04-30 DIAGNOSIS — I4819 Other persistent atrial fibrillation: Secondary | ICD-10-CM | POA: Diagnosis not present

## 2023-04-30 NOTE — Patient Instructions (Signed)
Medication Instructions:  Your physician recommends that you continue on your current medications as directed. Please refer to the Current Medication list given to you today.  *If you need a refill on your cardiac medications before your next appointment, please call your pharmacy*   Follow-Up: At [facility], you and your health needs are our priority.  As part of our continuing mission to provide you with exceptional heart care, we have created designated Provider Care Teams.  These Care Teams include your primary Cardiologist (physician) and Advanced Practice Providers (APPs -  Physician Assistants and Nurse Practitioners) who all work together to provide you with the care you need, when you need it.  We recommend signing up for the patient portal called "MyChart".  Sign up information is provided on this After Visit Summary.  MyChart is used to connect with patients for Virtual Visits (Telemedicine).  Patients are able to view lab/test results, encounter notes, upcoming appointments, etc.  Non-urgent messages can be sent to your provider as well.   To learn more about what you can do with MyChart, go to [email or web address].    Your next appointment:   6 month(s)  Provider:   Candee Furbish, MD

## 2023-05-21 ENCOUNTER — Encounter (HOSPITAL_COMMUNITY): Payer: Self-pay | Admitting: Physician Assistant

## 2023-05-21 ENCOUNTER — Ambulatory Visit (HOSPITAL_COMMUNITY)
Admission: RE | Admit: 2023-05-21 | Discharge: 2023-05-21 | Disposition: A | Discharge status: Home / Self Care | Payer: Medicare Other | Source: Ambulatory Visit | Attending: Physician Assistant | Admitting: Physician Assistant

## 2023-05-21 VITALS — BP 148/54 | HR 71 | Ht 70.0 in | Wt 181.2 lb

## 2023-05-21 DIAGNOSIS — Z7901 Long term (current) use of anticoagulants: Secondary | ICD-10-CM | POA: Insufficient documentation

## 2023-05-21 DIAGNOSIS — Z952 Presence of prosthetic heart valve: Secondary | ICD-10-CM | POA: Diagnosis not present

## 2023-05-21 DIAGNOSIS — I4819 Other persistent atrial fibrillation: Secondary | ICD-10-CM | POA: Insufficient documentation

## 2023-05-21 DIAGNOSIS — D6869 Other thrombophilia: Secondary | ICD-10-CM | POA: Diagnosis not present

## 2023-05-21 DIAGNOSIS — I2510 Atherosclerotic heart disease of native coronary artery without angina pectoris: Secondary | ICD-10-CM | POA: Insufficient documentation

## 2023-05-21 DIAGNOSIS — I4892 Unspecified atrial flutter: Secondary | ICD-10-CM | POA: Insufficient documentation

## 2023-05-21 DIAGNOSIS — I10 Essential (primary) hypertension: Secondary | ICD-10-CM | POA: Diagnosis not present

## 2023-05-21 NOTE — Progress Notes (Signed)
Primary Care Physician: Jason Martyr, MD Primary Cardiologist: Dr Jason Santana Primary Electrophysiologist: Dr Jason Santana Referring Physician: [facility]   Jason Santana is a 89 y.o. male with a history of CAD, HTN, mechanical AV (2000),  atrial fibrillation, and atrial flutter who presents for follow up in the [facility]. He is s/p afib and flutter ablation with Dr Jason Santana on 05/22/19.   On follow up today, patient reports that he has done well since his last visit. He denies any interim symptoms of afib. No bleeding issues on anticoagulation.    Today, he denies symptoms of palpitations, chest pain, shortness of breath, orthopnea, PND, lower extremity edema, dizziness, presyncope, syncope, snoring, daytime somnolence, bleeding, or neurologic sequela. The patient is tolerating medications without difficulties and is otherwise without complaint today.    Atrial Fibrillation Risk Factors:  he does not have symptoms or diagnosis of sleep apnea.   Atrial Fibrillation Management history: Previous antiarrhythmic drugs: amiodarone (stopped 2/2 elevated LFTs and TSH) Previous cardioversions: 2019 x2 Previous ablations: 05/22/19 fib and flutter Anticoagulation history: warfarin   Past Medical History:  Diagnosis Date   Anticoagulation adequate 05/23/2019   Atrial fibrillation (HCC)    CAD (coronary artery disease) of artery bypass graft 2000   2000 with multiple bypasses    Chronic anticoagulation    Coumadin therapy for mechanical aortic valve. Postoperative atrial fibrillation.    Essential hypertension    H/O mechanical aortic valve replacement 2000   St Jude AVR   Hypothyroidism    Premature ventricular contractions    S/P ablation of atrial fibrillation 05/22/19 05/23/2019   Typical atrial flutter (HCC) 05/23/2019    Current Outpatient Medications  Medication Sig Dispense Refill   Cholecalciferol (VITAMIN D) 2000 units tablet Take 2,000 Units by  mouth daily.     diltiazem (CARDIZEM) 30 MG tablet TAKE 1 TABLET AS NEEDED FOR PALPITATIONS OR HEART RATE >100 30 tablet 6   diltiazem (TIADYLT ER) 120 MG 24 hr capsule Take 1 capsule (120 mg total) by mouth 2 (two) times daily. 180 capsule 2   diphenhydramine-acetaminophen (TYLENOL PM) 25-500 MG TABS tablet Take 0.5 tablets by mouth at bedtime as needed (sleep).     ferrous sulfate 325 (65 FE) MG tablet Take 325 mg by mouth daily with supper.     furosemide (LASIX) 20 MG tablet Take 20 mg by mouth daily as needed for fluid.     levothyroxine (SYNTHROID) 125 MCG tablet Take 125 mcg by mouth daily before breakfast.      pantoprazole (PROTONIX) 40 MG tablet Take 40 mg by mouth 4 (four) times a week.     Polyethyl Glycol-Propyl Glycol (SYSTANE OP) Place 1 drop into both eyes daily as needed (dry eyes).     tamsulosin (FLOMAX) 0.4 MG CAPS capsule Take 0.8 mg by mouth daily.     tobramycin (TOBREX) 0.3 % ophthalmic solution Place 1 drop into the left eye See admin instructions. Instill 1 drop into left eye 1 day before, the day of, and the day after eye injections administered at Dr's office     triamcinolone cream (KENALOG) 0.1 % Apply 1 application topically 2 (two) times daily. 30 g 0   valsartan (DIOVAN) 320 MG tablet TAKE 1 TABLET BY MOUTH EVERY DAY 90 tablet 3   warfarin (COUMADIN) 5 MG tablet Take 5-7.5 mg by mouth See admin instructions. Per patient Sunday, Jason Santana, & Saturday taking 7.5 mg = 1 & 1/2 tab.  All other days taking 1  tab = 5mg  mg on  Mon, Wed, Friday     No current facility-administered medications for this encounter.    ROS- All systems are reviewed and negative except as per the HPI above.  Physical Exam: Vitals:   05/21/23 0836  BP: (!) 148/54  Pulse: 71  Weight: 82.2 kg  Height: 5\' 10"  (1.778 m)    GEN: Well nourished, well developed in no acute distress NECK: No JVD; No carotid bruits CARDIAC: Regular rate and rhythm, no murmurs, rubs, gallops, mech  AV RESPIRATORY:  Clear to auscultation without rales, wheezing or rhonchi  ABDOMEN: Soft, non-tender, non-distended EXTREMITIES:  No edema; No deformity    Wt Readings from Last 3 Encounters:  05/21/23 82.2 kg  04/30/23 81.7 kg  02/07/23 81.4 kg    EKG today demonstrates   SR, PAC Vent. rate 71 BPM PR interval 190 ms QRS duration 94 ms QT/QTcB 430/467 ms   Echo 08/12/18 demonstrated  - Left ventricle: The cavity size was normal. There was mild   concentric hypertrophy. Systolic function was normal. The   estimated ejection fraction was in the range of 55% to 60%. Wall   motion was normal; there were no regional wall motion   abnormalities. - Ventricular septum: Septal motion showed paradox. These changes   are consistent with a post-thoracotomy state. - Aortic valve: A mechanical prosthesis was present and functioning   normally. - Mitral valve: Calcified annulus. There was mild regurgitation. - Left atrium: The atrium was mildly dilated. - Right ventricle: Systolic function was mildly to moderately   reduced. - Right atrium: The atrium was mildly dilated.  Epic records are reviewed at length today    CHA2DS2-VASc Score = 5  The patient's score is based upon: CHF History: 1 HTN History: 1 Diabetes History: 0 Stroke History: 0 Vascular Disease History: 1 Age Score: 2 Gender Score: 0       ASSESSMENT AND PLAN: Persistent Atrial Fibrillation/atrial flutter The patient's CHA2DS2-VASc score is 5, indicating a 7.2% annual risk of stroke.   S/p afib and flutter ablation with Dr Jason Santana 05/22/19. Patient appears to be maintaining SR Continue diltiazem 120 mg BID  Continue warfarin  Secondary Hypercoagulable State (ICD10:  D68.69) The patient is at significant risk for stroke/thromboembolism based upon his CHA2DS2-VASc Score of 5.  Continue Warfarin (Coumadin).   CAD S/p remote CABG No anginal symptoms  HTN Stable on current regimen  AVR (mechanical)   Continue warfarin    Follow up in the AF clinic in one year.    Jason Loa PA-C [facility] [address] [place], [place] [address] [phone]

## 2023-06-18 DIAGNOSIS — H353111 Nonexudative age-related macular degeneration, right eye, early dry stage: Secondary | ICD-10-CM | POA: Diagnosis not present

## 2023-06-18 DIAGNOSIS — H04123 Dry eye syndrome of bilateral lacrimal glands: Secondary | ICD-10-CM | POA: Diagnosis not present

## 2023-06-18 DIAGNOSIS — H35372 Puckering of macula, left eye: Secondary | ICD-10-CM | POA: Diagnosis not present

## 2023-06-18 DIAGNOSIS — H348312 Tributary (branch) retinal vein occlusion, right eye, stable: Secondary | ICD-10-CM | POA: Diagnosis not present

## 2023-06-18 DIAGNOSIS — H353221 Exudative age-related macular degeneration, left eye, with active choroidal neovascularization: Secondary | ICD-10-CM | POA: Diagnosis not present

## 2023-06-26 DIAGNOSIS — Z7901 Long term (current) use of anticoagulants: Secondary | ICD-10-CM | POA: Diagnosis not present

## 2023-06-26 DIAGNOSIS — Z0000 Encounter for general adult medical examination without abnormal findings: Secondary | ICD-10-CM | POA: Diagnosis not present

## 2023-06-26 DIAGNOSIS — I2510 Atherosclerotic heart disease of native coronary artery without angina pectoris: Secondary | ICD-10-CM | POA: Diagnosis not present

## 2023-06-26 DIAGNOSIS — E039 Hypothyroidism, unspecified: Secondary | ICD-10-CM | POA: Diagnosis not present

## 2023-06-26 DIAGNOSIS — D509 Iron deficiency anemia, unspecified: Secondary | ICD-10-CM | POA: Diagnosis not present

## 2023-06-26 DIAGNOSIS — Z23 Encounter for immunization: Secondary | ICD-10-CM | POA: Diagnosis not present

## 2023-06-26 DIAGNOSIS — N1831 Chronic kidney disease, stage 3a: Secondary | ICD-10-CM | POA: Diagnosis not present

## 2023-06-26 DIAGNOSIS — E1122 Type 2 diabetes mellitus with diabetic chronic kidney disease: Secondary | ICD-10-CM | POA: Diagnosis not present

## 2023-06-26 DIAGNOSIS — Z1331 Encounter for screening for depression: Secondary | ICD-10-CM | POA: Diagnosis not present

## 2023-06-26 DIAGNOSIS — I4891 Unspecified atrial fibrillation: Secondary | ICD-10-CM | POA: Diagnosis not present

## 2023-06-26 DIAGNOSIS — I10 Essential (primary) hypertension: Secondary | ICD-10-CM | POA: Diagnosis not present

## 2023-06-26 DIAGNOSIS — H3530 Unspecified macular degeneration: Secondary | ICD-10-CM | POA: Diagnosis not present

## 2023-06-26 DIAGNOSIS — Z952 Presence of prosthetic heart valve: Secondary | ICD-10-CM | POA: Diagnosis not present

## 2023-06-26 DIAGNOSIS — E782 Mixed hyperlipidemia: Secondary | ICD-10-CM | POA: Diagnosis not present

## 2023-07-17 ENCOUNTER — Encounter: Payer: Self-pay | Admitting: Gastroenterology

## 2023-07-17 ENCOUNTER — Ambulatory Visit (INDEPENDENT_AMBULATORY_CARE_PROVIDER_SITE_OTHER): Payer: Medicare Other | Admitting: Gastroenterology

## 2023-07-17 VITALS — BP 134/70 | HR 71 | Ht 70.0 in | Wt 183.0 lb

## 2023-07-17 DIAGNOSIS — K219 Gastro-esophageal reflux disease without esophagitis: Secondary | ICD-10-CM

## 2023-07-17 DIAGNOSIS — K7469 Other cirrhosis of liver: Secondary | ICD-10-CM

## 2023-07-17 NOTE — Progress Notes (Signed)
HPI :  89 year old male here for a follow-up visit for cirrhosis.  I last saw him in March of this year.  Recall he has compensated cryptogenic cirrhosis diagnosed several years ago.  He states this was an incidental finding when he was imaged for another reason.  He has had an extensive serologic workup which has not revealed a clear diagnosis.  They never were told he had a history of fatty liver in the past, he has never drank alcohol previously.  He has declined liver biopsy over time.  Historically since his diagnosis he has had no decompensations.  He has felt really well since have last seen him. No jaundice, no ascites, no hepatic encephalopathy, no variceal bleeding.  He had an EGD with Dr. Christella Hartigan in December 2022 which showed no varices.  He has some occasional swelling in his lower extremities for which he takes Lasix as needed.  States this works okay when he needs it.  He continues to take Coumadin for history of A-fib and mechanical aortic valve.  He denies any exertional symptoms.  He is very active.  Recall he has been on Protonix longstanding for history of GERD.  He historically has only taking it 3 to 4 days/week and that has typically controlled the symptoms.  More recently he states he is really not having any reflux that bothers him.  He ran out of it for period of time and states he did not have any symptoms that bother him so he does not routinely take it anymore.  He does have at home if needed.  He denies any NSAID use.  Uses Tylenol as needed if he has aches or pains.  He had his flu shot this year, also had Shingrix vaccine, he does have a history of shingles.     Prior workup: EGD December 2022 Dr. Christella Hartigan mild nonspecific gastritis.  No esophageal varices, no gastric varices, no portal gastropathy.  Biopsies were negative for H. Pylori   Laboratory work-up late 2022 TTG negative, IgA normal, anti-smooth muscle antibody negative, antimitochondrial antibodies positive  at 37, alpha-1 antitrypsin level normal, alpha-fetoprotein level normal, ANA negative, hepatitis C antibody negative, hepatitis B surface antigen nonreactive, hepatitis B surface antibody nonreactive, hepatitis A total antibody negative, July 2023 AMA recheck, still positive but lower at 30. No prior liver biopsy. Hepatitis A/B immunization series started 2022 by primary care physician   Colonoscopy Dr. Willis Modena May 2019 while hospitalized for bleeding showed diverticulosis, hemorrhoids, no polyps.   RUQ [place] 03/08/23: IMPRESSION: Cirrhotic morphology of the liver. No focal lesion.    Past Medical History:  Diagnosis Date   Anticoagulation adequate 05/23/2019   Atrial fibrillation (HCC)    CAD (coronary artery disease) of artery bypass graft 2000   2000 with multiple bypasses    Chronic anticoagulation    Coumadin therapy for mechanical aortic valve. Postoperative atrial fibrillation.    Essential hypertension    H/O mechanical aortic valve replacement 2000   [employer] AVR   Hypothyroidism    Premature ventricular contractions    S/P ablation of atrial fibrillation 05/22/19 05/23/2019   Typical atrial flutter (HCC) 05/23/2019     Past Surgical History:  Procedure Laterality Date   AORTIC VALVE REPLACEMENT (AVR)/CORONARY ARTERY BYPASS GRAFTING (CABG)  2000   ATRIAL FIBRILLATION ABLATION N/A 05/22/2019   Procedure: ATRIAL FIBRILLATION ABLATION;  Surgeon: Regan Lemming, MD;  Location: [facility] INVASIVE CV LAB;  Service: Cardiovascular;  Laterality: N/A;   BIOPSY  08/24/2021  Procedure: BIOPSY;  Surgeon: Rachael Fee, MD;  Location: [facility] ENDOSCOPY;  Service: Endoscopy;;   CARDIOVERSION N/A 02/24/2018   Procedure: CARDIOVERSION;  Surgeon: Pricilla Riffle, MD;  Location: [facility] ENDOSCOPY;  Service: Cardiovascular;  Laterality: N/A;   CARDIOVERSION N/A 08/15/2018   Procedure: CARDIOVERSION;  Surgeon: Vesta Mixer, MD;  Location: [facility] ENDOSCOPY;  Service: Cardiovascular;  Laterality: N/A;    COLONOSCOPY WITH PROPOFOL N/A 01/08/2018   Procedure: COLONOSCOPY WITH PROPOFOL;  Surgeon: Willis Modena, MD;  Location: [facility] ENDOSCOPY;  Service: Gastroenterology;  Laterality: N/A;   CORONARY/GRAFT ANGIOGRAPHY N/A 08/14/2018   Procedure: CORONARY/GRAFT ANGIOGRAPHY;  Surgeon: Tonny Bollman, MD;  Location: [facility] INVASIVE CV LAB;  Service: Cardiovascular;  Laterality: N/A;   ESOPHAGOGASTRODUODENOSCOPY (EGD) WITH PROPOFOL N/A 01/06/2018   Procedure: ESOPHAGOGASTRODUODENOSCOPY (EGD) WITH PROPOFOL;  Surgeon: Kathi Der, MD;  Location: [facility] ENDOSCOPY;  Service: Gastroenterology;  Laterality: N/A;   ESOPHAGOGASTRODUODENOSCOPY (EGD) WITH PROPOFOL N/A 08/24/2021   Procedure: ESOPHAGOGASTRODUODENOSCOPY (EGD) WITH PROPOFOL;  Surgeon: Rachael Fee, MD;  Location: [facility] ENDOSCOPY;  Service: Endoscopy;  Laterality: N/A;   FOOT SURGERY Right    x 2   Family History  Problem Relation Age of Onset   Hypertension Mother    Heart attack Mother    Hypertension Father    Colon cancer Neg Hx    Esophageal cancer Neg Hx    Pancreatic cancer Neg Hx    Stomach cancer Neg Hx    Social History   Tobacco Use   Smoking status: Former    Types: Cigarettes   Smokeless tobacco: Never   Tobacco comments:    Former smoker (quit 66 years ago) 05/21/23  Vaping Use   Vaping status: Never Used  Substance Use Topics   Alcohol use: No   Drug use: No   Current Outpatient Medications  Medication Sig Dispense Refill   Cholecalciferol (VITAMIN D) 2000 units tablet Take 2,000 Units by mouth daily.     diltiazem (CARDIZEM) 30 MG tablet TAKE 1 TABLET AS NEEDED FOR PALPITATIONS OR HEART RATE >100 30 tablet 6   diltiazem (TIADYLT ER) 120 MG 24 hr capsule Take 1 capsule (120 mg total) by mouth 2 (two) times daily. 180 capsule 2   diphenhydramine-acetaminophen (TYLENOL PM) 25-500 MG TABS tablet Take 0.5 tablets by mouth at bedtime as needed (sleep).     ferrous sulfate 325 (65 FE) MG tablet Take 325 mg by mouth daily  with supper.     furosemide (LASIX) 20 MG tablet Take 20 mg by mouth daily as needed for fluid.     levothyroxine (SYNTHROID) 125 MCG tablet Take 125 mcg by mouth daily before breakfast.      pantoprazole (PROTONIX) 40 MG tablet Take 40 mg by mouth 4 (four) times a week.     Polyethyl Glycol-Propyl Glycol (SYSTANE OP) Place 1 drop into both eyes daily as needed (dry eyes).     tamsulosin (FLOMAX) 0.4 MG CAPS capsule Take 0.8 mg by mouth daily.     triamcinolone cream (KENALOG) 0.1 % Apply 1 application topically 2 (two) times daily. 30 g 0   valsartan (DIOVAN) 320 MG tablet TAKE 1 TABLET BY MOUTH EVERY DAY 90 tablet 3   warfarin (COUMADIN) 5 MG tablet Take 5-7.5 mg by mouth See admin instructions. Per patient Sunday, [name], & Saturday taking 7.5 mg = 1 & 1/2 tab. All other days taking 1  tab = 5mg  mg on  Mon, Wed, Friday     tobramycin (TOBREX) 0.3 %  ophthalmic solution Place 1 drop into the left eye See admin instructions. Instill 1 drop into left eye 1 day before, the day of, and the day after eye injections administered at Dr's office (Patient not taking: Reported on 07/17/2023)     No current facility-administered medications for this visit.   Allergies  Allergen Reactions   Carvedilol Shortness Of Breath    wheezing   Lopid [Gemfibrozil] Other (See Comments)    transaminitis   Monopril [Fosinopril] Other (See Comments)    transaminitis   Vicodin [Hydrocodone-Acetaminophen]     Severe sensitivity   Phenergan [Promethazine Hcl] Other (See Comments)    Severe somnolence.   Zocor [Simvastatin]     Short memory loss.   Benicar [Olmesartan]     Abdominal cramping and increased stools/ irritable bowel   Codeine Nausea And Vomiting   Prilosec [Omeprazole]     dyspepsia     Review of Systems: All systems reviewed and negative except where noted in HPI.   Lab Results  Component Value Date   WBC 6.1 03/08/2023   HGB 12.0 (L) 03/08/2023   HCT 36.1 (L) 03/08/2023   MCV 101.9  (H) 03/08/2023   PLT 225.0 03/08/2023    Lab Results  Component Value Date   NA 132 (L) 03/08/2023   CL 103 03/08/2023   K 4.5 03/08/2023   CO2 21 03/08/2023   BUN 18 03/08/2023   CREATININE 1.32 03/08/2023   GFR 48.12 (L) 03/08/2023   CALCIUM 9.7 03/08/2023   PHOS 2.7 02/28/2020   ALBUMIN 3.7 03/08/2023   GLUCOSE 110 (H) 03/08/2023    Lab Results  Component Value Date   ALT 17 03/08/2023   AST 44 (H) 03/08/2023   ALKPHOS 87 03/08/2023   BILITOT 1.4 (H) 03/08/2023     Physical Exam: BP 134/70   Pulse 71   Ht 5\' 10"  (1.778 m)   Wt 183 lb (83 kg)   BMI 26.26 kg/m  Constitutional: Pleasant,well-developed, male in no acute distress. Neurological: Alert and oriented to person place and time. Psychiatric: Normal mood and affect. Behavior is normal.   ASSESSMENT: 89 y.o. male here for assessment of the following  1. Other cirrhosis of liver (HCC)   2. Gastroesophageal reflux disease, unspecified whether esophagitis present    Compensated cryptogenic cirrhosis, stable over years.  His labs and imaging are up-to-date, no evidence of HCC.  He clinically is doing well without any symptoms of bother him.  We discussed his liver disease, risks for decompensation and HCC over time.  Given chronicity of this diagnosis and how stable he has been, hopefully his risk for decompensation over time remains low.  He wishes to continue with surveillance ultrasound every 6 months as well as labs for now.  I think we can likely forego further EGDs for varices screening, he would next be due in 2025, he will be 89 years old soon, risks may outweigh benefits if he is otherwise compensated.  I will continue to engage him on this, he does use Coumadin and at risk for GI bleeding.  Otherwise, history of GERD, has been on intermittent PPI dosing in the past a few days a week.  He ran out of it and noticed he does not feel that he needs it for reflux symptoms anymore, it is not really bothering him.   He has it at home to use as needed.    I will to continue to see him every 6 months, or sooner if any  issues.   PLAN: - recall RUQ [place] in February 2025 - recall labs in February 2025 - CBC, CMET, AFP - likely no further EGDs for variceal screening given age - continue protonix PRN - not using much now, asymptomatic - f/u 6 months or sooner with issues  Harlin Rain, MD [facility]

## 2023-07-25 DIAGNOSIS — Z7901 Long term (current) use of anticoagulants: Secondary | ICD-10-CM | POA: Diagnosis not present

## 2023-07-25 DIAGNOSIS — E782 Mixed hyperlipidemia: Secondary | ICD-10-CM | POA: Diagnosis not present

## 2023-08-15 DIAGNOSIS — Z7901 Long term (current) use of anticoagulants: Secondary | ICD-10-CM | POA: Diagnosis not present

## 2023-08-19 ENCOUNTER — Telehealth: Payer: Self-pay

## 2023-08-19 DIAGNOSIS — K7469 Other cirrhosis of liver: Secondary | ICD-10-CM

## 2023-08-19 NOTE — Telephone Encounter (Signed)
Patient scheduled for Friday, Jan 3rd at 10:00 am to  arrive at 9:45 am, NPO after midnight. Letter mailed to patient with appointment info and instructions to go to the lab

## 2023-08-19 NOTE — Telephone Encounter (Signed)
-----   Message from [facility] Why H sent at 03/12/2023 11:31 AM EDT ----- Regarding: due for labs and RUQ in January Due for labs and RUQ U/S in early January   CBC, CMET, AFP

## 2023-08-21 DIAGNOSIS — H04123 Dry eye syndrome of bilateral lacrimal glands: Secondary | ICD-10-CM | POA: Diagnosis not present

## 2023-08-21 DIAGNOSIS — H348312 Tributary (branch) retinal vein occlusion, right eye, stable: Secondary | ICD-10-CM | POA: Diagnosis not present

## 2023-08-21 DIAGNOSIS — H35372 Puckering of macula, left eye: Secondary | ICD-10-CM | POA: Diagnosis not present

## 2023-08-21 DIAGNOSIS — H353221 Exudative age-related macular degeneration, left eye, with active choroidal neovascularization: Secondary | ICD-10-CM | POA: Diagnosis not present

## 2023-08-21 DIAGNOSIS — H353111 Nonexudative age-related macular degeneration, right eye, early dry stage: Secondary | ICD-10-CM | POA: Diagnosis not present

## 2023-09-05 DIAGNOSIS — H6120 Impacted cerumen, unspecified ear: Secondary | ICD-10-CM | POA: Diagnosis not present

## 2023-09-05 DIAGNOSIS — E8770 Fluid overload, unspecified: Secondary | ICD-10-CM | POA: Diagnosis not present

## 2023-09-05 DIAGNOSIS — Z7901 Long term (current) use of anticoagulants: Secondary | ICD-10-CM | POA: Diagnosis not present

## 2023-09-05 DIAGNOSIS — R600 Localized edema: Secondary | ICD-10-CM | POA: Diagnosis not present

## 2023-09-05 DIAGNOSIS — I2510 Atherosclerotic heart disease of native coronary artery without angina pectoris: Secondary | ICD-10-CM | POA: Diagnosis not present

## 2023-09-06 ENCOUNTER — Other Ambulatory Visit (INDEPENDENT_AMBULATORY_CARE_PROVIDER_SITE_OTHER): Payer: Medicare Other

## 2023-09-06 ENCOUNTER — Telehealth: Payer: Self-pay | Admitting: Gastroenterology

## 2023-09-06 ENCOUNTER — Ambulatory Visit (HOSPITAL_COMMUNITY)
Admission: RE | Admit: 2023-09-06 | Discharge: 2023-09-06 | Disposition: A | Discharge status: Home / Self Care | Payer: Medicare Other | Source: Ambulatory Visit | Attending: Gastroenterology | Admitting: Gastroenterology

## 2023-09-06 DIAGNOSIS — K7469 Other cirrhosis of liver: Secondary | ICD-10-CM | POA: Diagnosis not present

## 2023-09-06 DIAGNOSIS — Z9049 Acquired absence of other specified parts of digestive tract: Secondary | ICD-10-CM | POA: Diagnosis not present

## 2023-09-06 DIAGNOSIS — K7460 Unspecified cirrhosis of liver: Secondary | ICD-10-CM | POA: Diagnosis not present

## 2023-09-06 LAB — CBC WITH DIFFERENTIAL/PLATELET
Basophils Absolute: 0 10*3/uL (ref 0.0–0.1)
Basophils Relative: 0.4 % (ref 0.0–3.0)
Eosinophils Absolute: 0.4 10*3/uL (ref 0.0–0.7)
Eosinophils Relative: 5.3 % — ABNORMAL HIGH (ref 0.0–5.0)
HCT: 35.1 % — ABNORMAL LOW (ref 39.0–52.0)
Hemoglobin: 11.9 g/dL — ABNORMAL LOW (ref 13.0–17.0)
Lymphocytes Relative: 20.9 % (ref 12.0–46.0)
Lymphs Abs: 1.4 10*3/uL (ref 0.7–4.0)
MCHC: 33.8 g/dL (ref 30.0–36.0)
MCV: 105.9 fL — ABNORMAL HIGH (ref 78.0–100.0)
Monocytes Absolute: 0.9 10*3/uL (ref 0.1–1.0)
Monocytes Relative: 13.8 % — ABNORMAL HIGH (ref 3.0–12.0)
Neutro Abs: 3.9 10*3/uL (ref 1.4–7.7)
Neutrophils Relative %: 59.6 % (ref 43.0–77.0)
Platelets: 220 10*3/uL (ref 150.0–400.0)
RBC: 3.32 Mil/uL — ABNORMAL LOW (ref 4.22–5.81)
RDW: 14.1 % (ref 11.5–15.5)
WBC: 6.6 10*3/uL (ref 4.0–10.5)

## 2023-09-06 LAB — COMPREHENSIVE METABOLIC PANEL
ALT: 18 U/L (ref 0–53)
AST: 49 U/L — ABNORMAL HIGH (ref 0–37)
Albumin: 3.8 g/dL (ref 3.5–5.2)
Alkaline Phosphatase: 95 U/L (ref 39–117)
BUN: 27 mg/dL — ABNORMAL HIGH (ref 6–23)
CO2: 25 meq/L (ref 19–32)
Calcium: 9.7 mg/dL (ref 8.4–10.5)
Chloride: 104 meq/L (ref 96–112)
Creatinine, Ser: 1.65 mg/dL — ABNORMAL HIGH (ref 0.40–1.50)
GFR: 36.69 mL/min — ABNORMAL LOW (ref >60.00)
Glucose, Bld: 121 mg/dL — ABNORMAL HIGH (ref 70–99)
Potassium: 4.1 meq/L (ref 3.5–5.1)
Sodium: 139 meq/L (ref 135–145)
Total Bilirubin: 1.5 mg/dL — ABNORMAL HIGH (ref 0.2–1.2)
Total Protein: 7.6 g/dL (ref 6.0–8.3)

## 2023-09-06 NOTE — Telephone Encounter (Signed)
 Inbound call from patient, requesting to speak with a nurse in regards to lab results and liver function. He states he is worried about taking his lasix.

## 2023-09-06 NOTE — Telephone Encounter (Signed)
 Returned call to patient. Pt states that he reached out to Dr. Christiane office but they were gone for the day. I advised pt to continue Lasix  as prescribed and reach out to Dr. Christiane office on Monday for further instruction. Pt was thankful for the return call.

## 2023-09-09 ENCOUNTER — Encounter: Payer: Self-pay | Admitting: Gastroenterology

## 2023-09-09 LAB — AFP TUMOR MARKER: AFP-Tumor Marker: 5 ng/mL (ref <6.1)

## 2023-09-10 ENCOUNTER — Telehealth: Payer: Self-pay

## 2023-09-10 NOTE — Telephone Encounter (Signed)
 Pt called into the office to review labs and US  results. I reviewed lab results with patient again and clarified that his increased dose of Lasix  caused the abnormality and that he should follow up with his PCP for lab recheck. We reviewed US  results and he knows that we will repeat in 6 months. Pt knows to expect a copy of US  results in the mail. Pt verbalized understanding of information and had no concerns at the end of the call.

## 2023-09-11 DIAGNOSIS — R600 Localized edema: Secondary | ICD-10-CM | POA: Diagnosis not present

## 2023-09-11 DIAGNOSIS — Z7901 Long term (current) use of anticoagulants: Secondary | ICD-10-CM | POA: Diagnosis not present

## 2023-09-30 ENCOUNTER — Ambulatory Visit: Payer: Self-pay | Admitting: Internal Medicine

## 2023-09-30 DIAGNOSIS — M7989 Other specified soft tissue disorders: Secondary | ICD-10-CM | POA: Diagnosis not present

## 2023-09-30 DIAGNOSIS — M25571 Pain in right ankle and joints of right foot: Secondary | ICD-10-CM | POA: Diagnosis not present

## 2023-09-30 DIAGNOSIS — Z7901 Long term (current) use of anticoagulants: Secondary | ICD-10-CM | POA: Diagnosis not present

## 2023-09-30 NOTE — Telephone Encounter (Signed)
  Chief Complaint: Foot swelling Symptoms: Right foot swelling, right foot erythema, right foot pain. Frequency: Constant Pertinent Negatives: Patient denies Fever, nonambulation, drainage Disposition: [] ED /[x] Urgent Care (no appt availability in office) / [] Appointment(In office/virtual)/ []  [facility]/ [] Home Care/ [x] Refused Recommended Disposition /[]  Mobile Bus/ []  Follow-up with PCP Additional Notes: Patient called with complaints of right foot pain, swelling, and redness for about 3 weeks. Patient states that he wanted to get antibiotics prescribed to him before his new patient appointment. Patient denies drainage from foot, but states to be having mild pain with walking. Cause of symptoms unknown. Per protocol, patient advised by this RN to be seen within 4 hours at an UC due to no office appt availability. Patient states that he didn't want to wait in the UC but would try to see a doctor in town, this RN attempted to schedule UC visit but no appointments available within protocol timeframe. Patient advised by this RN to call back or go to UC with worsening symptoms. Patient verbalized understanding.   Copied from CRM [id number]. Topic: Clinical - Red Word Triage >> Sep 30, 2023  9:57 AM Elle L wrote: Red Word that prompted transfer to Nurse Triage: The patient states his orthopedic doctor said he has cellulitis. The patient's foot is red, swollen and in pain. Reason for Disposition  [1] Red area or streak [2] large (> 2 in. or 5 cm)  Answer Assessment - Initial Assessment Questions 1. ONSET: "When did the swelling start?" (e.g., minutes, hours, days)     3 weeks ago 2. LOCATION: "What part of the leg is swollen?"  "Are both legs swollen or just one leg?"     Right foot 3. SEVERITY: "How bad is the swelling?" (e.g., localized; mild, moderate, severe)   - Localized: Small area of swelling localized to one leg.   - MILD pedal edema: Swelling limited to foot and  ankle, pitting edema < 1/4 inch (6 mm) deep, rest and elevation eliminate most or all swelling.   - MODERATE edema: Swelling of lower leg to knee, pitting edema > 1/4 inch (6 mm) deep, rest and elevation only partially reduce swelling.   - SEVERE edema: Swelling extends above knee, facial or hand swelling present.      Severe 4. REDNESS: "Does the swelling look red or infected?"     Confirms 5. PAIN: "Is the swelling painful to touch?" If Yes, ask: "How painful is it?"   (Scale 1-10; mild, moderate or severe)     Mild 6. FEVER: "Do you have a fever?" If Yes, ask: "What is it, how was it measured, and when did it start?"      Denies 7. CAUSE: "What do you think is causing the leg swelling?"     Unknown 8. MEDICAL HISTORY: "Do you have a history of blood clots (e.g., DVT), cancer, heart failure, kidney disease, or liver failure?"    A-fib 9. RECURRENT SYMPTOM: "Have you had leg swelling before?" If Yes, ask: "When was the last time?" "What happened that time?"     Denies 10. OTHER SYMPTOMS: "Do you have any other symptoms?" (e.g., chest pain, difficulty breathing)       Denies  Protocols used: Leg Swelling and Edema-A-AH

## 2023-10-03 ENCOUNTER — Emergency Department (HOSPITAL_BASED_OUTPATIENT_CLINIC_OR_DEPARTMENT_OTHER): Payer: Medicare Other

## 2023-10-03 ENCOUNTER — Emergency Department (HOSPITAL_BASED_OUTPATIENT_CLINIC_OR_DEPARTMENT_OTHER)
Admission: EM | Admit: 2023-10-03 | Discharge: 2023-10-03 | Disposition: A | Discharge status: Home / Self Care | Payer: Medicare Other | Attending: Emergency Medicine | Admitting: Emergency Medicine

## 2023-10-03 ENCOUNTER — Encounter (HOSPITAL_BASED_OUTPATIENT_CLINIC_OR_DEPARTMENT_OTHER): Payer: Self-pay

## 2023-10-03 ENCOUNTER — Other Ambulatory Visit: Payer: Self-pay

## 2023-10-03 DIAGNOSIS — R600 Localized edema: Secondary | ICD-10-CM | POA: Insufficient documentation

## 2023-10-03 DIAGNOSIS — L03115 Cellulitis of right lower limb: Secondary | ICD-10-CM

## 2023-10-03 DIAGNOSIS — I10 Essential (primary) hypertension: Secondary | ICD-10-CM | POA: Insufficient documentation

## 2023-10-03 DIAGNOSIS — Z79899 Other long term (current) drug therapy: Secondary | ICD-10-CM | POA: Diagnosis not present

## 2023-10-03 DIAGNOSIS — Z7901 Long term (current) use of anticoagulants: Secondary | ICD-10-CM | POA: Diagnosis not present

## 2023-10-03 DIAGNOSIS — R0602 Shortness of breath: Secondary | ICD-10-CM | POA: Diagnosis not present

## 2023-10-03 DIAGNOSIS — Z87891 Personal history of nicotine dependence: Secondary | ICD-10-CM | POA: Diagnosis not present

## 2023-10-03 DIAGNOSIS — J439 Emphysema, unspecified: Secondary | ICD-10-CM | POA: Diagnosis not present

## 2023-10-03 DIAGNOSIS — M7989 Other specified soft tissue disorders: Secondary | ICD-10-CM | POA: Diagnosis not present

## 2023-10-03 DIAGNOSIS — E039 Hypothyroidism, unspecified: Secondary | ICD-10-CM | POA: Insufficient documentation

## 2023-10-03 DIAGNOSIS — I517 Cardiomegaly: Secondary | ICD-10-CM | POA: Diagnosis not present

## 2023-10-03 DIAGNOSIS — I2510 Atherosclerotic heart disease of native coronary artery without angina pectoris: Secondary | ICD-10-CM | POA: Insufficient documentation

## 2023-10-03 DIAGNOSIS — J9811 Atelectasis: Secondary | ICD-10-CM | POA: Diagnosis not present

## 2023-10-03 DIAGNOSIS — J90 Pleural effusion, not elsewhere classified: Secondary | ICD-10-CM | POA: Diagnosis not present

## 2023-10-03 DIAGNOSIS — I700 Atherosclerosis of aorta: Secondary | ICD-10-CM | POA: Diagnosis not present

## 2023-10-03 LAB — CBC WITH DIFFERENTIAL/PLATELET
Abs Immature Granulocytes: 0.02 10*3/uL (ref 0.00–0.07)
Basophils Absolute: 0 10*3/uL (ref 0.0–0.1)
Basophils Relative: 0 %
Eosinophils Absolute: 0.3 10*3/uL (ref 0.0–0.5)
Eosinophils Relative: 5 %
HCT: 29.3 % — ABNORMAL LOW (ref 39.0–52.0)
Hemoglobin: 9.4 g/dL — ABNORMAL LOW (ref 13.0–17.0)
Immature Granulocytes: 0 %
Lymphocytes Relative: 18 %
Lymphs Abs: 1.2 10*3/uL (ref 0.7–4.0)
MCH: 34.7 pg — ABNORMAL HIGH (ref 26.0–34.0)
MCHC: 32.1 g/dL (ref 30.0–36.0)
MCV: 108.1 fL — ABNORMAL HIGH (ref 80.0–100.0)
Monocytes Absolute: 0.9 10*3/uL (ref 0.1–1.0)
Monocytes Relative: 14 %
Neutro Abs: 4.4 10*3/uL (ref 1.7–7.7)
Neutrophils Relative %: 63 %
Platelets: 232 10*3/uL (ref 150–400)
RBC: 2.71 MIL/uL — ABNORMAL LOW (ref 4.22–5.81)
RDW: 14.3 % (ref 11.5–15.5)
WBC: 7 10*3/uL (ref 4.0–10.5)
nRBC: 0 % (ref 0.0–0.2)

## 2023-10-03 LAB — COMPREHENSIVE METABOLIC PANEL
ALT: 25 U/L (ref 0–44)
AST: 55 U/L — ABNORMAL HIGH (ref 15–41)
Albumin: 3.3 g/dL — ABNORMAL LOW (ref 3.5–5.0)
Alkaline Phosphatase: 92 U/L (ref 38–126)
Anion gap: 6 (ref 5–15)
BUN: 22 mg/dL (ref 8–23)
CO2: 22 mmol/L (ref 22–32)
Calcium: 8.8 mg/dL — ABNORMAL LOW (ref 8.9–10.3)
Chloride: 108 mmol/L (ref 98–111)
Creatinine, Ser: 1.6 mg/dL — ABNORMAL HIGH (ref 0.61–1.24)
GFR, Estimated: 41 mL/min — ABNORMAL LOW (ref >60)
Glucose, Bld: 128 mg/dL — ABNORMAL HIGH (ref 70–99)
Potassium: 4.6 mmol/L (ref 3.5–5.1)
Sodium: 136 mmol/L (ref 135–145)
Total Bilirubin: 1.2 mg/dL (ref 0.0–1.2)
Total Protein: 7 g/dL (ref 6.5–8.1)

## 2023-10-03 LAB — PROTIME-INR
INR: 1.8 — ABNORMAL HIGH (ref 0.8–1.2)
Prothrombin Time: 21.4 s — ABNORMAL HIGH (ref 11.4–15.2)

## 2023-10-03 LAB — BRAIN NATRIURETIC PEPTIDE: B Natriuretic Peptide: 171.3 pg/mL — ABNORMAL HIGH (ref 0.0–100.0)

## 2023-10-03 MED ORDER — DOXYCYCLINE HYCLATE 100 MG PO CAPS: 100.0000 mg | ORAL_CAPSULE | Freq: Two times a day (BID) | ORAL | 0 refills | Status: DC

## 2023-10-03 MED ORDER — DOXYCYCLINE HYCLATE 100 MG PO TABS
100.0000 mg | ORAL_TABLET | Freq: Once | ORAL | Status: AC
  Administered 2023-10-03: 100 mg via ORAL
  Filled 2023-10-03: qty 1

## 2023-10-03 NOTE — ED Triage Notes (Signed)
Patient arrives POV to ED with complaints of worsening bilateral leg swelling (more swelling to his right leg) x1 month. Patient is being treated for cellulitis, but he was sent here for an ultrasound to rule out a clot. Pain worsens with ambulation.

## 2023-10-03 NOTE — Discharge Instructions (Signed)
You can stop your other antibiotic.  Recommend that she start doxycycline 100 mg twice a day.  Prescription sent to your pharmacy.  Make an appointment to follow-up with your primary care doctor to have your INR rechecked next week to have your hemoglobin rechecked next week and then follow your kidney function and decide whether they want to increase your diuretic.  As we discussed it may not be a cellulitis in the right foot right lower leg it just could be chronic skin changes secondary to the fluid.  Tonight's workup showed some small pleural effusion on the left side.  But no evidence of any significant pulmonary edema.  And the Doppler studies were negative for any deep vein thrombosis in the legs.

## 2023-10-03 NOTE — ED Provider Notes (Addendum)
[place] [facility] Provider Note   CSN: [id number] Arrival date & time: 10/03/23  1257     History  Chief Complaint  Patient presents with   Leg Swelling    Both     Jason Santana is a 89 y.o. male.  Patient sent in by provider for bilateral leg swelling more so on the right along with some erythema to the top of the foot and the distal anterior part of the leg.  Patient is being treated for cellulitis for this.  Sounds as if it is probably Keflex patient did not bring the medication with him.  Patient is on Coumadin.  Has been long-term.  Patient was sent in predominantly for an ultrasound to rule out clot.  Patient has increased pain in that right leg with ambulation.  Patient is also states has a little bit of shortness of breath.  Patient's past history significant for atrial fibrillation coronary artery disease chronic anticoagulation due to the atrial fibs.  Essential hypertension hypothyroidism status post ablation of atrial fibrillation in 2020.  Aortic valve replacement in 2000 patient former smoker cigarettes.  Patient does not have any chest pain.  Patient does have Lasix listed as a medication.  Not clear whether he is taking that.  Patient is also on extended release diltiazem.  Patient is on Synthroid.       Home Medications Prior to Admission medications   Medication Sig Start Date End Date Taking? Authorizing Provider  Cholecalciferol (VITAMIN D) 2000 units tablet Take 2,000 Units by mouth daily.    [provider]  diltiazem (CARDIZEM) 30 MG tablet TAKE 1 TABLET AS NEEDED FOR PALPITATIONS OR HEART RATE >100 10/17/22   Camnitz, Andree Coss, MD  diltiazem (TIADYLT ER) 120 MG 24 hr capsule Take 1 capsule (120 mg total) by mouth 2 (two) times daily. 01/29/23   Camnitz, Will Daphine Deutscher, MD  diphenhydramine-acetaminophen (TYLENOL PM) 25-500 MG TABS tablet Take 0.5 tablets by mouth at bedtime as needed (sleep).    [provider]  ferrous sulfate 325 (65 FE) MG tablet Take 325 mg by mouth daily with supper.    [provider]  furosemide (LASIX) 20 MG tablet Take 20 mg by mouth daily as needed for fluid.    [provider]  levothyroxine (SYNTHROID) 125 MCG tablet Take 125 mcg by mouth daily before breakfast.  11/21/18   [provider]  pantoprazole (PROTONIX) 40 MG tablet Take 40 mg by mouth 4 (four) times a week.    [provider]  Polyethyl Glycol-Propyl Glycol (SYSTANE OP) Place 1 drop into both eyes daily as needed (dry eyes).    [provider]  tamsulosin (FLOMAX) 0.4 MG CAPS capsule Take 0.8 mg by mouth daily. 12/18/21   [provider]  tobramycin (TOBREX) 0.3 % ophthalmic solution Place 1 drop into the left eye See admin instructions. Instill 1 drop into left eye 1 day before, the day of, and the day after eye injections administered at Dr's office Patient not taking: Reported on 07/17/2023    [provider]  triamcinolone cream (KENALOG) 0.1 % Apply 1 application topically 2 (two) times daily. 02/06/21   Wieters, Hallie C, PA-C  valsartan (DIOVAN) 320 MG tablet TAKE 1 TABLET BY MOUTH EVERY DAY 01/24/23   Camnitz, Andree Coss, MD  warfarin (COUMADIN) 5 MG tablet Take 5-7.5 mg by mouth See admin instructions. Per patient Sunday, Tues, Thur, & Saturday taking 7.5 mg =  1 & 1/2 tab. All other days taking 1  tab = 5mg  mg on  Mon, Wed, Friday    [provider]      Allergies    Carvedilol, Lopid [gemfibrozil], Monopril [fosinopril], Vicodin [hydrocodone-acetaminophen], Phenergan [promethazine hcl], Zocor [simvastatin], Benicar [olmesartan], Codeine, and Prilosec [omeprazole]    Review of Systems   Review of Systems  Constitutional:  Negative for chills and fever.  HENT:  Negative for ear pain and sore throat.   Eyes:  Negative for pain and visual disturbance.  Respiratory:  Positive for shortness of breath. Negative for cough.    Cardiovascular:  Positive for leg swelling. Negative for chest pain and palpitations.  Gastrointestinal:  Negative for abdominal pain and vomiting.  Genitourinary:  Negative for dysuria and hematuria.  Musculoskeletal:  Negative for arthralgias and back pain.  Skin:  Negative for color change and rash.  Neurological:  Negative for seizures and syncope.  All other systems reviewed and are negative.   Physical Exam Updated Vital Signs BP (!) 142/57   Pulse 80   Temp 98.1 F (36.7 C) (Oral)   Resp 18   Ht 1.778 m (5\' 10" )   Wt 83.5 kg   SpO2 98%   BMI 26.40 kg/m  Physical Exam Vitals and nursing note reviewed.  Constitutional:      General: He is not in acute distress.    Appearance: Normal appearance. He is well-developed. He is not ill-appearing.  HENT:     Head: Normocephalic and atraumatic.     Mouth/Throat:     Mouth: Mucous membranes are moist.  Eyes:     Extraocular Movements: Extraocular movements intact.     Conjunctiva/sclera: Conjunctivae normal.     Pupils: Pupils are equal, round, and reactive to light.  Cardiovascular:     Rate and Rhythm: Normal rate and regular rhythm.     Heart sounds: No murmur heard. Pulmonary:     Effort: Pulmonary effort is normal. No respiratory distress.     Breath sounds: Normal breath sounds.  Abdominal:     Palpations: Abdomen is soft.     Tenderness: There is no abdominal tenderness.  Musculoskeletal:        General: No swelling.     Cervical back: Normal range of motion and neck supple. No rigidity.     Right lower leg: Edema present.     Left lower leg: Edema present.     Comments: Good cap refill to both feet.  Dorsalis pedis pulse 1+ in both feet.  Right foot has a lot of erythema on top it is a little bit tender.  Some erythema to the distal anterior leg.  Marked edema to the right leg greater than left leg.  Calf is tight.  Skin:    General: Skin is warm and dry.     Capillary Refill: Capillary refill takes less than  2 seconds.  Neurological:     General: No focal deficit present.     Mental Status: He is alert. Mental status is at baseline.  Psychiatric:        Mood and Affect: Mood normal.     ED Results / Procedures / Treatments   Labs (all labs ordered are listed, but only abnormal results are displayed) Labs Reviewed  CBC WITH DIFFERENTIAL/PLATELET - Abnormal; Notable for the following components:      Result Value   RBC 2.71 (*)    Hemoglobin 9.4 (*)    HCT 29.3 (*)  MCV 108.1 (*)    MCH 34.7 (*)    All other components within normal limits  COMPREHENSIVE METABOLIC PANEL - Abnormal; Notable for the following components:   Glucose, Bld 128 (*)    Creatinine, Ser 1.60 (*)    Calcium 8.8 (*)    Albumin 3.3 (*)    AST 55 (*)    GFR, Estimated 41 (*)    All other components within normal limits  BRAIN NATRIURETIC PEPTIDE - Abnormal; Notable for the following components:   B Natriuretic Peptide 171.3 (*)    All other components within normal limits  PROTIME-INR - Abnormal; Notable for the following components:   Prothrombin Time 21.4 (*)    INR 1.8 (*)    All other components within normal limits    EKG None  Radiology DG Chest 2 View Result Date: 10/03/2023 CLINICAL DATA:  Bilateral leg swelling for 1 month.  Question CHF EXAM: CHEST - 2 VIEW COMPARISON:  09/05/2023 FINDINGS: Median sternotomy for aortic valve repair and CABG. Midline trachea. Mild cardiomegaly. Atherosclerosis in the transverse aorta. Left pleural thickening blunts the costophrenic angle on the frontal. No pneumothorax. No congestive failure. IMPRESSION: Mild cardiomegaly, without congestive failure. Electronically Signed   By: Jeronimo Greaves M.D.   On: 10/03/2023 18:40    Procedures Procedures    Medications Ordered in ED Medications - No data to display  ED Course/ Medical Decision Making/ A&P                                 Medical Decision Making Amount and/or Complexity of Data Reviewed Labs:  ordered. Radiology: ordered.   CBC white count 7 hemoglobin 9.4.  Little bit lower than it was just a few weeks ago.  Platelets is 232.  Complete metabolic panel creatinine 1.6 for GFR 41 not significantly different for him glucose 128 electrolytes normal LFTs normal BNP 171.3 so it is elevated but has been higher in the past.  INR is 1.8 so a little bit subtherapeutic.  Chest x-ray mild cardiomegaly without congestive heart failure bilateral Doppler study still pending.  Based on their concerns for fluid overload we will go ahead and get CT chest.  Patient has felt a little bit more short of breath.  Doppler studies of both legs without evidence of any deep vein thrombosis.  Patient's BNP was elevated a little bit at 171.  Patient's INR 1.8 as mentioned above.  Patient states she was told to hold his Coumadin for the past 2 days but also he did have his dose today.  Will have him take his dose at home.  Have his primary care doctor follow-up on that next week.  Also is to have his primary care doctor follow-up on what may be right lower extremity right foot cellulitis but it could be chronic changes due to the edema.  Patient did not have a significant white count here.  Also CT chest was done just to rule out any evidence of pulmonary edema because of the elevated BNP.  There was a small left pleural effusion otherwise no acute findings. CT raise some concerns about maybe cirrhosis of the liver.  The patient's liver function test are completely normal.  Patient's GFR is 41 somewhat baseline for him.  Patient's hemoglobin a little bit low here at 9.4.  Have his primary care doctor recheck his hemoglobin recheck his INR and follow-up on the effects of treating  him with doxycycline for the questionable right lower extremity cellulitis.    Final Clinical Impression(s) / ED Diagnoses Final diagnoses:  Bilateral lower extremity edema    Rx / DC Orders ED Discharge Orders     None          Vanetta Mulders, MD 10/03/23 2006    Vanetta Mulders, MD 10/03/23 2146

## 2023-10-03 NOTE — ED Notes (Signed)
Ultrasound at bedside

## 2023-10-04 DIAGNOSIS — R600 Localized edema: Secondary | ICD-10-CM | POA: Diagnosis not present

## 2023-10-07 DIAGNOSIS — M7989 Other specified soft tissue disorders: Secondary | ICD-10-CM | POA: Diagnosis not present

## 2023-10-07 DIAGNOSIS — Z7901 Long term (current) use of anticoagulants: Secondary | ICD-10-CM | POA: Diagnosis not present

## 2023-10-11 DIAGNOSIS — E039 Hypothyroidism, unspecified: Secondary | ICD-10-CM | POA: Diagnosis not present

## 2023-10-11 DIAGNOSIS — N1831 Chronic kidney disease, stage 3a: Secondary | ICD-10-CM | POA: Diagnosis not present

## 2023-10-11 DIAGNOSIS — Z952 Presence of prosthetic heart valve: Secondary | ICD-10-CM | POA: Diagnosis not present

## 2023-10-11 DIAGNOSIS — K7469 Other cirrhosis of liver: Secondary | ICD-10-CM | POA: Diagnosis not present

## 2023-10-11 DIAGNOSIS — Z7901 Long term (current) use of anticoagulants: Secondary | ICD-10-CM | POA: Diagnosis not present

## 2023-10-11 DIAGNOSIS — E1122 Type 2 diabetes mellitus with diabetic chronic kidney disease: Secondary | ICD-10-CM | POA: Diagnosis not present

## 2023-10-11 DIAGNOSIS — I10 Essential (primary) hypertension: Secondary | ICD-10-CM | POA: Diagnosis not present

## 2023-10-11 DIAGNOSIS — D649 Anemia, unspecified: Secondary | ICD-10-CM | POA: Diagnosis not present

## 2023-10-11 DIAGNOSIS — I4891 Unspecified atrial fibrillation: Secondary | ICD-10-CM | POA: Diagnosis not present

## 2023-10-11 DIAGNOSIS — R600 Localized edema: Secondary | ICD-10-CM | POA: Diagnosis not present

## 2023-10-14 ENCOUNTER — Other Ambulatory Visit: Payer: Self-pay | Admitting: *Deleted

## 2023-10-14 ENCOUNTER — Other Ambulatory Visit: Payer: Self-pay | Admitting: Physician Assistant

## 2023-10-14 ENCOUNTER — Ambulatory Visit: Payer: Medicare Other | Attending: Physician Assistant | Admitting: Physician Assistant

## 2023-10-14 ENCOUNTER — Encounter: Payer: Self-pay | Admitting: Physician Assistant

## 2023-10-14 ENCOUNTER — Ambulatory Visit: Payer: Medicare Other

## 2023-10-14 VITALS — BP 112/62 | HR 71 | Resp 16 | Ht 70.0 in | Wt 186.4 lb

## 2023-10-14 DIAGNOSIS — I4892 Unspecified atrial flutter: Secondary | ICD-10-CM | POA: Diagnosis present

## 2023-10-14 DIAGNOSIS — R600 Localized edema: Secondary | ICD-10-CM | POA: Insufficient documentation

## 2023-10-14 DIAGNOSIS — I10 Essential (primary) hypertension: Secondary | ICD-10-CM | POA: Diagnosis not present

## 2023-10-14 DIAGNOSIS — I4819 Other persistent atrial fibrillation: Secondary | ICD-10-CM

## 2023-10-14 DIAGNOSIS — I7121 Aneurysm of the ascending aorta, without rupture: Secondary | ICD-10-CM

## 2023-10-14 DIAGNOSIS — I2510 Atherosclerotic heart disease of native coronary artery without angina pectoris: Secondary | ICD-10-CM | POA: Insufficient documentation

## 2023-10-14 DIAGNOSIS — D6869 Other thrombophilia: Secondary | ICD-10-CM | POA: Diagnosis not present

## 2023-10-14 DIAGNOSIS — Z952 Presence of prosthetic heart valve: Secondary | ICD-10-CM

## 2023-10-14 NOTE — Patient Instructions (Signed)
 Medication Instructions:  Your physician has recommended you make the following change in your medication:   INCREASE the Lasix  to 40 taking 1 tablet 2 times a day for 3 days then go back down to 1 tablet daily  *If you need a refill on your cardiac medications before your next appointment, please call your pharmacy*   Lab Work: WHEN YOU COME FOR THE ECHO, GO TO [facility] FOR:  BMET  If you have labs (blood work) drawn today and your tests are completely normal, you will receive your results only by: MyChart Message (if you have MyChart) OR A paper copy in the mail If you have any lab test that is abnormal or we need to change your treatment, we will call you to review the results.   Testing/Procedures: Your physician has requested that you have an echocardiogram BEFORE 10/28/23. Echocardiography is a painless test that uses sound waves to create images of your heart. It provides your doctor with information about the size and shape of your heart and how well your heart's chambers and valves are working. This procedure takes approximately one hour. There are no restrictions for this procedure. Please do NOT wear cologne, perfume, aftershave, or lotions (deodorant is allowed). Please arrive 15 minutes prior to your appointment time.  Please note: We ask at that you not bring children with you during ultrasound (echo/ vascular) testing. Due to room size and safety concerns, children are not allowed in the ultrasound rooms during exams. Our front office staff cannot provide observation of children in our lobby area while testing is being conducted. An adult accompanying a patient to their appointment will only be allowed in the ultrasound room at the discretion of the ultrasound technician under special circumstances. We apologize for any inconvenience.    Follow-Up: At [facility], you and your health needs are our priority.  As part of our continuing mission to provide you with  exceptional heart care, we have created designated Provider Care Teams.  These Care Teams include your primary Cardiologist (physician) and Advanced Practice Providers (APPs -  Physician Assistants and Nurse Practitioners) who all work together to provide you with the care you need, when you need it.  We recommend signing up for the patient portal called "MyChart".  Sign up information is provided on this After Visit Summary.  MyChart is used to connect with patients for Virtual Visits (Telemedicine).  Patients are able to view lab/test results, encounter notes, upcoming appointments, etc.  Non-urgent messages can be sent to your provider as well.   To learn more about what you can do with MyChart, go to [email or web address].    Your next appointment:   AS SCHEDULED   Provider:   Dorothye Gathers, MD  or Theotis Flake, PA-C         Other Instructions DASH Eating Plan DASH stands for Dietary Approaches to Stop Hypertension. The DASH eating plan is a healthy eating plan that has been shown to: Lower high blood pressure (hypertension). Reduce your risk for type 2 diabetes, heart disease, and stroke. Help with weight loss. What are tips for following this plan? Reading food labels Check food labels for the amount of salt (sodium) per serving. Choose foods with less than 5 percent of the Daily Value (DV) of sodium. In general, foods with less than 300 milligrams (mg) of sodium per serving fit into this eating plan. To find whole grains, look for the word "whole" as the first word in the ingredient list.  Shopping Buy products labeled as "low-sodium" or "no salt added." Buy fresh foods. Avoid canned foods and pre-made or frozen meals. Cooking Try not to add salt when you cook. Use salt-free seasonings or herbs instead of table salt or sea salt. Check with your health care provider or pharmacist before using salt substitutes. Do not fry foods. Cook foods in healthy ways, such as baking, boiling,  grilling, roasting, or broiling. Cook using oils that are good for your heart. These include olive, canola, avocado, soybean, and sunflower oil. Meal planning  Eat a balanced diet. This should include: 4 or more servings of fruits and 4 or more servings of vegetables each day. Try to fill half of your plate with fruits and vegetables. 6-8 servings of whole grains each day. 6 or less servings of lean meat, poultry, or fish each day. 1 oz is 1 serving. A 3 oz (85 g) serving of meat is about the same size as the palm of your hand. One egg is 1 oz (28 g). 2-3 servings of low-fat dairy each day. One serving is 1 cup (237 mL). 1 serving of nuts, seeds, or beans 5 times each week. 2-3 servings of heart-healthy fats. Healthy fats called omega-3 fatty acids are found in foods such as walnuts, flaxseeds, fortified milks, and eggs. These fats are also found in cold-water fish, such as sardines, salmon, and mackerel. Limit how much you eat of: Canned or prepackaged foods. Food that is high in trans fat, such as fried foods. Food that is high in saturated fat, such as fatty meat. Desserts and other sweets, sugary drinks, and other foods with added sugar. Full-fat dairy products. Do not salt foods before eating. Do not eat more than 4 egg yolks a week. Try to eat at least 2 vegetarian meals a week. Eat more home-cooked food and less restaurant, buffet, and fast food. Lifestyle When eating at a restaurant, ask if your food can be made with less salt or no salt. If you drink alcohol : Limit how much you have to: 0-1 drink a day if you are male. 0-2 drinks a day if you are male. Know how much alcohol  is in your drink. In the [place] one drink is one 12 oz bottle of beer (355 mL), one 5 oz glass of wine (148 mL), or one 1 oz glass of hard liquor (44 mL). General information Avoid eating more than 2,300 mg of salt a day. If you have hypertension, you may need to reduce your sodium intake to 1,500 mg a  day. Work with your provider to stay at a healthy body weight or lose weight. Ask what the best weight range is for you. On most days of the week, get at least 30 minutes of exercise that causes your heart to beat faster. This may include walking, swimming, or biking. Work with your provider or dietitian to adjust your eating plan to meet your specific calorie needs. What foods should I eat? Fruits All fresh, dried, or frozen fruit. Canned fruits that are in their natural juice and do not have sugar added to them. Vegetables Fresh or frozen vegetables that are raw, steamed, roasted, or grilled. Low-sodium or reduced-sodium tomato and vegetable juice. Low-sodium or reduced-sodium tomato sauce and tomato paste. Low-sodium or reduced-sodium canned vegetables. Grains Whole-grain or whole-wheat bread. Whole-grain or whole-wheat pasta. Brown rice. Dwyane Glad. Bulgur. Whole-grain and low-sodium cereals. Pita bread. Low-fat, low-sodium crackers. Whole-wheat flour tortillas. Meats and other proteins Skinless chicken or [place]. Ground chicken  or [place]. Pork with fat trimmed off. Fish and seafood. Egg whites. Dried beans, peas, or lentils. Unsalted nuts, nut butters, and seeds. Unsalted canned beans. Lean cuts of beef with fat trimmed off. Low-sodium, lean precooked or cured meat, such as sausages or meat loaves. Dairy Low-fat (1%) or fat-free (skim) milk. Reduced-fat, low-fat, or fat-free cheeses. Nonfat, low-sodium ricotta or cottage cheese. Low-fat or nonfat yogurt. Low-fat, low-sodium cheese. Fats and oils Soft margarine without trans fats. Vegetable oil. Reduced-fat, low-fat, or light mayonnaise and salad dressings (reduced-sodium). Canola, safflower, olive, avocado, soybean, and sunflower oils. Avocado. Seasonings and condiments Herbs. Spices. Seasoning mixes without salt. Other foods Unsalted popcorn and pretzels. Fat-free sweets. The items listed above may not be all the foods and drinks you  can have. Talk to a dietitian to learn more. What foods should I avoid? Fruits Canned fruit in a light or heavy syrup. Fried fruit. Fruit in cream or butter sauce. Vegetables Creamed or fried vegetables. Vegetables in a cheese sauce. Regular canned vegetables that are not marked as low-sodium or reduced-sodium. Regular canned tomato sauce and paste that are not marked as low-sodium or reduced-sodium. Regular tomato and vegetable juices that are not marked as low-sodium or reduced-sodium. Vanessa General. Olives. Grains Baked goods made with fat, such as croissants, muffins, or some breads. Dry pasta or rice meal packs. Meats and other proteins Fatty cuts of meat. Ribs. Fried meat. Helene Loader. Bologna, salami, and other precooked or cured meats, such as sausages or meat loaves, that are not lean and low in sodium. Fat from the back of a pig (fatback). Bratwurst. Salted nuts and seeds. Canned beans with added salt. Canned or smoked fish. Whole eggs or egg yolks. Chicken or [place] with skin. Dairy Whole or 2% milk, cream, and half-and-half. Whole or full-fat cream cheese. Whole-fat or sweetened yogurt. Full-fat cheese. Nondairy creamers. Whipped toppings. Processed cheese and cheese spreads. Fats and oils Butter. Stick margarine. Lard. Shortening. Ghee. Bacon fat. Tropical oils, such as coconut, palm kernel, or palm oil. Seasonings and condiments Onion salt, garlic salt, seasoned salt, table salt, and sea salt. Worcestershire sauce. Tartar sauce. Barbecue sauce. Teriyaki sauce. Soy sauce, including reduced-sodium soy sauce. Steak sauce. Canned and packaged gravies. Fish sauce. Oyster sauce. Cocktail sauce. Store-bought horseradish. Ketchup. Mustard. Meat flavorings and tenderizers. Bouillon cubes. Hot sauces. Pre-made or packaged marinades. Pre-made or packaged taco seasonings. Relishes. Regular salad dressings. Other foods Salted popcorn and pretzels. The items listed above may not be all the foods and drinks you  should avoid. Talk to a dietitian to learn more. Where to find more information National Heart, Lung, and Blood Institute (NHLBI): BuffaloDryCleaner.gl American Heart Association (AHA): heart.org Academy of Nutrition and Dietetics: eatright.org National Kidney Foundation (NKF): kidney.org This information is not intended to replace advice given to you by your health care provider. Make sure you discuss any questions you have with your health care provider. Document Revised: 09/06/2022 Document Reviewed: 09/06/2022 Elsevier Patient Education  2024 Elsevier Inc.     1st Floor: - Lobby - Registration  - Pharmacy  - Lab - Cafe  2nd Floor: - PV Lab - Diagnostic Testing (echo, CT, nuclear med)  3rd Floor: - Vacant  4th Floor: - TCTS (cardiothoracic surgery) - AFib Clinic - Structural Heart Clinic - Vascular Surgery  - Vascular Ultrasound  5th Floor: - [facility] (general and EP) - Clinical Pharmacy for coumadin , hypertension, lipid, weight-loss medications, and med management appointments    Valet parking services will be available as well.

## 2023-10-14 NOTE — Progress Notes (Signed)
 Cardiology Office Note:  .   Date:  10/14/2023  ID:  Jason Santana, DOB [birth year], MRN [id number] PCP: Jason Mina, MD  [facility] Providers Cardiologist:  Jason Gathers, MD Electrophysiologist:  Jason Pump, MD    History of Present Illness: .   Jason Santana is a 89 y.o. male  hx of mechanical aortic valve replacement (2000), paroxysmal atrial fibrillation and atrial flutter and ablation 2020, chronic anticoagulation therapy, coronary artery disease with remote CABG, lower GI bleed May 2019 required pausing of anticoagulation therapy.  Electrical cardioversion performed June 2019, August 15, 2018, and October 22, 2018 prior to  A. fib/flutter ablation September 2020 ([place])--> NSR.  Patient went to ED 10/03/23 with bilateral leg swelling more so on the right along with some erythema to the top of the foot and the distal anterior part of the leg. Patient was being treated for cellulitis for this.Doppler studies of both legs without evidence of any deep vein thrombosis. Patient's BNP was elevated a little bit at 171. Patient's INR 1.8 as mentioned above. Patient states he was told to hold his Coumadin  for  2 days but also he did have his dose today.  Patient here with his wife. Lasix  increased to 40 mg daily by PCP on Friday and edema has gone down some. Eating frozen  dinners, canned soup, eating out regularly. No chest pain, palpitations, dyspnea. Was active until feet started to swell. Trying to keep them elevated.  ROS:    Studies Reviewed: Jason Santana         Prior CV Studies:    CT chest 10/03/23 IMPRESSION: 1. Small left pleural effusion with left basilar atelectasis. 2. Lobulated contour of the liver worrisome for cirrhosis.   Aortic Atherosclerosis (ICD10-I70.0) and Emphysema (ICD10-J43.9).   Chest CT scan 05/2021: IMPRESSION: 1. Stable top-normal/mildly enlarged ascending thoracic aorta measuring 3.9 cm by CTA today. 2. Stable chronic lung disease. 3. Nodular  contour of liver likely reflecting underlying cirrhosis.   Aortic Atherosclerosis (ICD10-I70.0) and Emphysema (ICD10-J43.9).   08/24/22 ECHO  1. S/P AVR with mean gradient 14 mmHg and no AI.   2. Left ventricular ejection fraction, by estimation, is 60 to 65%. The  left ventricle has normal function. The left ventricle has no regional  wall motion abnormalities. There is mild concentric left ventricular  hypertrophy. Left ventricular diastolic  parameters are indeterminate.   3. Right ventricular systolic function is normal. The right ventricular  size is normal.   4. The mitral valve is normal in structure. Trivial mitral valve  regurgitation. No evidence of mitral stenosis. Severe mitral annular  calcification.   5. The aortic valve has been repaired/replaced. Aortic valve  regurgitation is not visualized. No aortic stenosis is present. There is a  St. Jude mechanical valve present in the aortic position.   6. Aortic dilatation noted. There is mild dilatation of the ascending  aorta, measuring 43 mm.    TTE 08/12/18  Review of the above records today demonstrates:  - Left ventricle: The cavity size was normal. There was mild   concentric hypertrophy. Systolic function was normal. The   estimated ejection fraction was in the range of 55% to 60%. Wall   motion was normal; there were no regional wall motion   abnormalities. - Ventricular septum: Septal motion showed paradox. These changes   are consistent with a post-thoracotomy state. - Aortic valve: A mechanical prosthesis was present and functioning   normally. - Mitral valve: Calcified annulus. There  was mild regurgitation. - Left atrium: The atrium was mildly dilated. - Right ventricle: Systolic function was mildly to moderately   reduced. - Right atrium: The atrium was mildly dilated.   LHC 08/14/18 Mid RCA lesion is 100% stenosed. Ost 2nd Mrg lesion is 100% stenosed. Prox LAD to Mid LAD lesion is 50%  stenosed. SVG. The graft exhibits mild .  1.  Severe two-vessel coronary artery disease with total occlusion of the OM 2 and the mid RCA, nonobstructive LAD stenosis 2.  Status post remote aortocoronary bypass surgery with continued patency of the LIMA to LAD, saphenous vein graft OM 2, and saphenous vein graft to PDA    Risk Assessment/Calculations:    CHA2DS2-VASc Score = 5   This indicates a 7.2% annual risk of stroke. The patient's score is based upon: CHF History: 1 HTN History: 1 Diabetes History: 0 Stroke History: 0 Vascular Disease History: 1 Age Score: 2 Gender Score: 0            Physical Exam:   VS:  BP 112/62 (BP Location: Left Arm, Patient Position: Sitting, Cuff Size: Normal)   Pulse 71   Resp 16   Ht 5\' 10"  (1.778 m)   Wt 186 lb 6.4 oz (84.6 kg)   SpO2 98%   BMI 26.75 kg/m    Wt Readings from Last 3 Encounters:  10/14/23 186 lb 6.4 oz (84.6 kg)  10/03/23 184 lb (83.5 kg)  07/17/23 183 lb (83 kg)    GEN: Well nourished, well developed in no acute distress NECK: No JVD; No carotid bruits CARDIAC: irreg irreg, no murmurs, rubs, gallops RESPIRATORY:  Clear to auscultation without rales, wheezing or rhonchi  ABDOMEN: Soft, non-tender, non-distended EXTREMITIES:  No edema; No deformity   ASSESSMENT AND PLAN: .    Bilateral LE edema for several weeks. Given antibiotic for cellulitis. Lasix  increased to 40 mg daily on Friday and he's lost 2 lbs. Getting extra salt in his diet. BNP 171, Crt 1.60, Hbg 9.4, INR 1.8. Back in Afib today which likely is contributing to this. Will check echo, increase lasix  40 mg bid x 3 days then back to 40 mg daily. Repeat bmet. 2 gm sodium diet.  Paroxysmal atrial fibrillation: Currently on warfarin.  Status post ablation 05/22/2019.  CHA2DS2-VASc of 5. He's had 3 EKG's that Dr. Lawana Santana reviewed and feels like he is back in Afib, rate controlled. He recommended either DCCV or Afib clinic. Since recent INR was low we'd have to wait on  DCCV. Will refer back to Afib clinic.      Mechanical aortic valve replacement 2020 : last echo 08/2022 stable. Coumadin  managed by PCP. INR low in ED 1.8   Coronary artery disease remote CABG: No current chest pain    Hypertension: controlled on valsartan , diltiazem ,     Aortic aneurysm: 4.0 cm echo 09/2023 CT     Secondary hypercoagulable state: Currently on warfarin for atrial fibrillation as above                Dispo: f/u with Dr. Renna Cary and Afib clinic.  Signed, Theotis Flake, PA-C

## 2023-10-15 ENCOUNTER — Ambulatory Visit (HOSPITAL_COMMUNITY): Payer: Medicare Other | Attending: Internal Medicine

## 2023-10-15 DIAGNOSIS — I4892 Unspecified atrial flutter: Secondary | ICD-10-CM | POA: Diagnosis not present

## 2023-10-15 DIAGNOSIS — E039 Hypothyroidism, unspecified: Secondary | ICD-10-CM | POA: Diagnosis not present

## 2023-10-15 DIAGNOSIS — Z7901 Long term (current) use of anticoagulants: Secondary | ICD-10-CM | POA: Diagnosis not present

## 2023-10-15 DIAGNOSIS — N1831 Chronic kidney disease, stage 3a: Secondary | ICD-10-CM | POA: Diagnosis not present

## 2023-10-15 LAB — ECHOCARDIOGRAM COMPLETE
AR max vel: 1.06 cm2
AV Area VTI: 1.36 cm2
AV Area mean vel: 1.21 cm2
AV Mean grad: 10.4 mm[Hg]
AV Peak grad: 18.7 mm[Hg]
Ao pk vel: 2.16 m/s
Area-P 1/2: 2.46 cm2
S' Lateral: 2.9 cm

## 2023-10-16 ENCOUNTER — Encounter (HOSPITAL_COMMUNITY): Payer: Self-pay | Admitting: Physician Assistant

## 2023-10-16 ENCOUNTER — Encounter: Payer: Self-pay | Admitting: Family Medicine

## 2023-10-16 ENCOUNTER — Ambulatory Visit (INDEPENDENT_AMBULATORY_CARE_PROVIDER_SITE_OTHER): Payer: Medicare Other | Admitting: Family Medicine

## 2023-10-16 ENCOUNTER — Ambulatory Visit (HOSPITAL_COMMUNITY)
Admission: RE | Admit: 2023-10-16 | Discharge: 2023-10-16 | Discharge status: Home / Self Care | Payer: Medicare Other | Source: Ambulatory Visit | Attending: Physician Assistant | Admitting: Physician Assistant

## 2023-10-16 ENCOUNTER — Ambulatory Visit (HOSPITAL_COMMUNITY)
Admission: RE | Admit: 2023-10-16 | Discharge: 2023-10-16 | Disposition: A | Discharge status: Home / Self Care | Payer: Medicare Other | Source: Ambulatory Visit | Attending: Physician Assistant

## 2023-10-16 VITALS — BP 152/58 | HR 78 | Ht 70.0 in | Wt 185.8 lb

## 2023-10-16 VITALS — BP 129/46 | HR 75 | Ht 70.0 in | Wt 184.0 lb

## 2023-10-16 DIAGNOSIS — I4891 Unspecified atrial fibrillation: Secondary | ICD-10-CM | POA: Diagnosis not present

## 2023-10-16 DIAGNOSIS — E039 Hypothyroidism, unspecified: Secondary | ICD-10-CM | POA: Diagnosis not present

## 2023-10-16 DIAGNOSIS — Z7689 Persons encountering health services in other specified circumstances: Secondary | ICD-10-CM

## 2023-10-16 DIAGNOSIS — I5032 Chronic diastolic (congestive) heart failure: Secondary | ICD-10-CM | POA: Insufficient documentation

## 2023-10-16 DIAGNOSIS — Z952 Presence of prosthetic heart valve: Secondary | ICD-10-CM | POA: Diagnosis not present

## 2023-10-16 DIAGNOSIS — I10 Essential (primary) hypertension: Secondary | ICD-10-CM | POA: Diagnosis not present

## 2023-10-16 DIAGNOSIS — I4819 Other persistent atrial fibrillation: Secondary | ICD-10-CM | POA: Diagnosis present

## 2023-10-16 DIAGNOSIS — R600 Localized edema: Secondary | ICD-10-CM | POA: Diagnosis not present

## 2023-10-16 DIAGNOSIS — I4892 Unspecified atrial flutter: Secondary | ICD-10-CM | POA: Insufficient documentation

## 2023-10-16 DIAGNOSIS — Z79899 Other long term (current) drug therapy: Secondary | ICD-10-CM | POA: Diagnosis not present

## 2023-10-16 DIAGNOSIS — I110 Hypertensive heart disease with heart failure: Secondary | ICD-10-CM | POA: Diagnosis not present

## 2023-10-16 DIAGNOSIS — D6869 Other thrombophilia: Secondary | ICD-10-CM | POA: Insufficient documentation

## 2023-10-16 DIAGNOSIS — Z7901 Long term (current) use of anticoagulants: Secondary | ICD-10-CM

## 2023-10-16 DIAGNOSIS — I2510 Atherosclerotic heart disease of native coronary artery without angina pectoris: Secondary | ICD-10-CM | POA: Diagnosis not present

## 2023-10-16 NOTE — Progress Notes (Signed)
New Patient Office Visit  Subjective   Patient ID: Jason Santana, male    DOB: 1932-07-13  Age: 89 y.o. MRN: [id number]  CC:  Chief Complaint  Patient presents with   New Patient (Initial Visit)    HPI Jason Santana presents to establish care  Patient was seeing Dr. Kevan Ny at [place] for he retired.  He then saw Dr. Eula Listen.  He is now establishing here because it is closer to his house.  His brother also comes to our clinic.  Patient has a history of atrial fibrillation, coronary artery disease and CABG.  Sees a cardiologist, Dr. Anne Fu.  Patient has chronic issues with leg swelling and states that has been worse lately.  He was told by his PCP to go see his cardiologist.  At his cardiologist office he was advised to increase his Lasix from 20 to 40 mg and take it for twice a day for 3 days.  Patient has also recently been to the emergency department and had a ultrasound of his leg to rule out a DVT.  He has tried compression stockings in the past but they were too uncomfortable to wear.  Patient states that when he takes his Lasix it still causes increased urination.  States he has not noticed much of a difference between 20 and 40 mg.  Patient today saw his cardiologist in the A-fib clinic out of concerns he was going back into A-fib after having cardioversion in the past.  Patient also has been diagnosed with cirrhosis and follows with gastroenterologist at [facility], Dr. Adela Lank routinely.  The cause of his cirrhosis was not identified.  Patient was getting his INR checked by his pcp at [facility].  States he recently has had to make adjustments because of fluctuating INR levels.  Had a low INR of 1.8 and then most recent was 4.4.  He typically takes 7.5 mg 5 days a week on Saturday Sunday Tuesday Wednesday and Thursday and 5 mg on Monday and Friday.  This previous Monday he held the 5 mg dose.  PMH: A-fib, CAD SP CABG, HTN, hypothyroidism,   Medications: Atorvastatin, diltiazem,  furosemide, Synthroid, pantoprazole, valsartan, warfarin.    PSH: CABG, A-fib ablation  FH: Hypertension-mother and father, MI-mother  Tobacco use: no.   Alcohol use: never Drug use: no Marital status: married. 3 children.  7 grandchildren.  3 great grandchildren.   Employment: retired.   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     Screenings:  Colon Cancer: Most recent 2019.  No polyps    Outpatient Encounter Medications as of 10/16/2023  Medication Sig   bacitracin ointment Apply 1 Application topically as needed for wound care (for feet and legs).   Cholecalciferol (VITAMIN D) 2000 units tablet Take 2,000 Units by mouth daily.   diltiazem (CARDIZEM) 30 MG tablet TAKE 1 TABLET AS NEEDED FOR PALPITATIONS OR HEART RATE >100   diltiazem (TIADYLT ER) 120 MG 24 hr capsule Take 1 capsule (120 mg total) by mouth 2 (two) times daily.   diphenhydramine-acetaminophen (TYLENOL PM) 25-500 MG TABS tablet Take 0.5 tablets by mouth at bedtime as needed (sleep).   ferrous sulfate 325 (65 FE) MG tablet Take 325 mg by mouth daily with supper.   furosemide (LASIX) 40 MG tablet Take 40 mg by mouth daily.   levothyroxine (SYNTHROID) 125 MCG tablet Take 125 mcg by mouth daily before breakfast.    pantoprazole (PROTONIX) 40 MG tablet Take 40 mg by mouth 4 (four) times a week.  Polyethyl Glycol-Propyl Glycol (SYSTANE OP) Place 1 drop into both eyes daily as needed (dry eyes).   tamsulosin (FLOMAX) 0.4 MG CAPS capsule Take 0.8 mg by mouth daily.   tobramycin (TOBREX) 0.3 % ophthalmic solution Place 1 drop into the left eye See admin instructions. Instill 1 drop into left eye 1 day before, the day of, and the day after eye injections administered at Dr's office   valsartan (DIOVAN) 320 MG tablet TAKE 1 TABLET BY MOUTH EVERY DAY   warfarin (COUMADIN) 5 MG tablet Take 5-7.5 mg by mouth See admin instructions. Per patient Sunday, Jason Santana, & Saturday taking 7.5 mg = 1 & 1/2 tab. All other days taking 1  tab = 5mg   mg on  Mon, Wed, Friday   No facility-administered encounter medications on file as of 10/16/2023.    Past Medical History:  Diagnosis Date   Anticoagulation adequate 05/23/2019   Atrial fibrillation (HCC)    CAD (coronary artery disease) of artery bypass graft 2000   2000 with multiple bypasses    Chronic anticoagulation    Coumadin therapy for mechanical aortic valve. Postoperative atrial fibrillation.    Essential hypertension    H/O mechanical aortic valve replacement 2000   St Jude AVR   Hypothyroidism    Premature ventricular contractions    S/P ablation of atrial fibrillation 05/22/19 05/23/2019   Typical atrial flutter (HCC) 05/23/2019    Past Surgical History:  Procedure Laterality Date   AORTIC VALVE REPLACEMENT (AVR)/CORONARY ARTERY BYPASS GRAFTING (CABG)  2000   ATRIAL FIBRILLATION ABLATION N/A 05/22/2019   Procedure: ATRIAL FIBRILLATION ABLATION;  Surgeon: Regan Lemming, MD;  Location: [facility] INVASIVE CV LAB;  Service: Cardiovascular;  Laterality: N/A;   BIOPSY  08/24/2021   Procedure: BIOPSY;  Surgeon: Rachael Fee, MD;  Location: [facility]COPY;  Service: Endoscopy;;   CARDIOVERSION N/A 02/24/2018   Procedure: CARDIOVERSION;  Surgeon: Pricilla Riffle, MD;  Location: [facility] ENDOSCOPY;  Service: Cardiovascular;  Laterality: N/A;   CARDIOVERSION N/A 08/15/2018   Procedure: CARDIOVERSION;  Surgeon: Vesta Mixer, MD;  Location: [facility] ENDOSCOPY;  Service: Cardiovascular;  Laterality: N/A;   COLONOSCOPY WITH PROPOFOL N/A 01/08/2018   Procedure: COLONOSCOPY WITH PROPOFOL;  Surgeon: Willis Modena, MD;  Location: [facility] ENDOSCOPY;  Service: Gastroenterology;  Laterality: N/A;   CORONARY/GRAFT ANGIOGRAPHY N/A 08/14/2018   Procedure: CORONARY/GRAFT ANGIOGRAPHY;  Surgeon: Tonny Bollman, MD;  Location: [facility] INVASIVE CV LAB;  Service: Cardiovascular;  Laterality: N/A;   ESOPHAGOGASTRODUODENOSCOPY (EGD) WITH PROPOFOL N/A 01/06/2018   Procedure: ESOPHAGOGASTRODUODENOSCOPY (EGD) WITH  PROPOFOL;  Surgeon: Kathi Der, MD;  Location: [facility] ENDOSCOPY;  Service: Gastroenterology;  Laterality: N/A;   ESOPHAGOGASTRODUODENOSCOPY (EGD) WITH PROPOFOL N/A 08/24/2021   Procedure: ESOPHAGOGASTRODUODENOSCOPY (EGD) WITH PROPOFOL;  Surgeon: Rachael Fee, MD;  Location: [facility] ENDOSCOPY;  Service: Endoscopy;  Laterality: N/A;   FOOT SURGERY Right    x 2    Family History  Problem Relation Age of Onset   Hypertension Mother    Heart attack Mother    Hypertension Father    Colon cancer Neg Hx    Esophageal cancer Neg Hx    Pancreatic cancer Neg Hx    Stomach cancer Neg Hx     Social History   Socioeconomic History   Marital status: Married    Spouse name: Not on file   Number of children: Not on file   Years of education: Not on file   Highest education level: Not on file  Occupational History   Not  on file  Tobacco Use   Smoking status: Former    Types: Cigarettes   Smokeless tobacco: Never   Tobacco comments:    Former smoker (quit 66 years ago) 05/21/23  Vaping Use   Vaping status: Never Used  Substance and Sexual Activity   Alcohol use: No   Drug use: No   Sexual activity: Not on file  Other Topics Concern   Not on file  Social History Narrative   Not on file   Social Drivers of Health   Financial Resource Strain: Not on file  Food Insecurity: No Food Insecurity (03/28/2022)   Hunger Vital Sign    Worried About Running Out of Food in the Last Year: Never true    Ran Out of Food in the Last Year: Never true  Transportation Needs: No Transportation Needs (03/28/2022)   PRAPARE - Administrator, Civil Service (Medical): No    Lack of Transportation (Non-Medical): No  Physical Activity: Not on file  Stress: Not on file  Social Connections: Not on file  Intimate Partner Violence: Not on file    ROS     Objective   BP (!) 129/46   Pulse 75   Ht 5\' 10"  (1.778 m)   Wt 184 lb (83.5 kg)   SpO2 97%   BMI 26.40 kg/m   Physical  Exam General: Alert, oriented.  Coming by wife. HEENT: PERRLA, EOMI, moist mucosa CV: Irregular rhythm.  Mechanical valve auscultated. Pulmonary: No respiratory distress.  Lungs clear bilaterally. GI: Soft, tender MSK: Strength equal bilaterally Extremities: Pitting edema, more significant in the right leg.     Assessment & Plan:   Encounter to establish care  Edema, lower extremity Assessment & Plan: Chronic issue that patient states is worsening over the last month.  Right is worse than left.  Patient has had ankle surgery on the right and saphenous vein graft on this leg as well I believe.  Do not think his leg swelling is due to decompensated heart failure.  He is compliant with his medication, the medication is effective in causing diuresis, and no significant volume overload elsewhere appreciated.  More likely a combination of venous insufficiency and previous surgeries on the right leg causing worsening swelling in the right.  Encouraged patient to try compression stockings again which she did not tolerate in the past.  Referred him to vein and vascular specialist  Orders: -     Ambulatory referral to Vascular Surgery  Chronic anticoagulation Assessment & Plan: On warfarin.  Currently taking 7.5 mg Saturday Sunday Tuesday Wednesday Thursday, 5 mg Monday and Friday.  Was told to hold this Monday's dose due to an INR of 4.4 which I cannot see in his chart.  Generally stable but most recently has fluctuated between low and high INR. - Recheck his INR later this week.  Adjust warfarin accordingly  Orders: -     Protime-INR; Future  Atrial fibrillation, unspecified type [facility]) Assessment & Plan: Patient has a history of A-fib.  Has had DC cardioversion in the past.  On warfarin.  Also taking diltiazem.  Saw his cardiologist in May in the A-fib clinic today and a Zio patch was placed and an EKG was done.  EKG did not show any A-fib.  On exam today his heart rate sounded irregular so I  would not be surprised if Zio patch shows atrial fibrillation.  Continue current medications.   Essential hypertension Assessment & Plan: Currently taking valsartan.  Continue.  No changes to dose.  Has had a recent BMP.   Hypothyroidism, unspecified type Assessment & Plan: Continue levothyroxine.     Return in about 4 weeks (around 11/13/2023) for Edema.   Sandre Kitty, MD

## 2023-10-16 NOTE — Assessment & Plan Note (Signed)
Currently taking valsartan.  Continue.  No changes to dose.  Has had a recent BMP.

## 2023-10-16 NOTE — Assessment & Plan Note (Addendum)
Patient has a history of A-fib.  Has had DC cardioversion in the past.  On warfarin.  Also taking diltiazem.  Saw his cardiologist in May in the A-fib clinic today and a Zio patch was placed and an EKG was done.  EKG did not show any A-fib.  On exam today his heart rate sounded irregular so I would not be surprised if Zio patch shows atrial fibrillation.  Continue current medications.

## 2023-10-16 NOTE — Assessment & Plan Note (Signed)
On warfarin.  Currently taking 7.5 mg Saturday Sunday Tuesday Wednesday Thursday, 5 mg Monday and Friday.  Was told to hold this Monday's dose due to an INR of 4.4 which I cannot see in his chart.  Generally stable but most recently has fluctuated between low and high INR. - Recheck his INR later this week.  Adjust warfarin accordingly

## 2023-10-16 NOTE — Assessment & Plan Note (Signed)
Continue levothyroxine

## 2023-10-16 NOTE — Patient Instructions (Addendum)
It was nice to see you today,  We addressed the following topics today: -You can schedule a nurse visit to have your ears cleaned and also get your INR checked at that time. - I have sent in a referral to the vein and vascular specialist regarding your leg swelling. - I would also recommend that you use compression stockings if able as this is the simplest and most effective treatment outside of your Lasix. - I like to see back in 1 month. Have a great day,  Frederic Jericho, MD

## 2023-10-16 NOTE — Assessment & Plan Note (Signed)
Chronic issue that patient states is worsening over the last month.  Right is worse than left.  Patient has had ankle surgery on the right and saphenous vein graft on this leg as well I believe.  Do not think his leg swelling is due to decompensated heart failure.  He is compliant with his medication, the medication is effective in causing diuresis, and no significant volume overload elsewhere appreciated.  More likely a combination of venous insufficiency and previous surgeries on the right leg causing worsening swelling in the right.  Encouraged patient to try compression stockings again which she did not tolerate in the past.  Referred him to vein and vascular specialist

## 2023-10-16 NOTE — Progress Notes (Addendum)
Primary Care Physician: Jason Housekeeper, MD Primary Cardiologist: Dr Jason Santana Primary Electrophysiologist: Dr Jason Santana Referring Physician: [facility]   Jason Santana is a 89 y.o. male with a history of CAD, HTN, mechanical AV (2000),  atrial fibrillation, and atrial flutter who presents for follow up in the [facility]. He is s/p afib and flutter ablation with Dr Jason Santana on 05/22/19.   Patient returns for follow up for atrial fibrillation. He was seen at the ED 10/03/23 with bilateral leg swelling. He was treated for cellulitis. BNP was mildly elevated at 171. He was in rate controlled afib at that time. Seen by Jason Santana 2/10 and remained in afib. An echo was done which showed preserved EF 60-65%, functioning mechanical AV. Patient denies any awareness of his arrhythmia.   Today, he denies symptoms of palpitations, chest pain, shortness of breath, orthopnea, PND, dizziness, presyncope, syncope, snoring, daytime somnolence, bleeding, or neurologic sequela. The patient is tolerating medications without difficulties and is otherwise without complaint today.    Atrial Fibrillation Risk Factors:  he does not have symptoms or diagnosis of sleep apnea.   Atrial Fibrillation Management history: Previous antiarrhythmic drugs: amiodarone (stopped 2/2 elevated LFTs and TSH) Previous cardioversions: 2019 x2 Previous ablations: 05/22/19 fib and flutter Anticoagulation history: warfarin   Past Medical History:  Diagnosis Date   Anticoagulation adequate 05/23/2019   Atrial fibrillation (HCC)    CAD (coronary artery disease) of artery bypass graft 2000   2000 with multiple bypasses    Chronic anticoagulation    Coumadin therapy for mechanical aortic valve. Postoperative atrial fibrillation.    Essential hypertension    H/O mechanical aortic valve replacement 2000   St Jude AVR   Hypothyroidism    Premature ventricular contractions    S/P ablation of atrial  fibrillation 05/22/19 05/23/2019   Typical atrial flutter (HCC) 05/23/2019    Current Outpatient Medications  Medication Sig Dispense Refill   bacitracin ointment Apply 1 Application topically as needed for wound care (for feet and legs).     Cholecalciferol (VITAMIN D) 2000 units tablet Take 2,000 Units by mouth daily.     diltiazem (CARDIZEM) 30 MG tablet TAKE 1 TABLET AS NEEDED FOR PALPITATIONS OR HEART RATE >100 30 tablet 6   diltiazem (TIADYLT ER) 120 MG 24 hr capsule Take 1 capsule (120 mg total) by mouth 2 (two) times daily. 180 capsule 2   diphenhydramine-acetaminophen (TYLENOL PM) 25-500 MG TABS tablet Take 0.5 tablets by mouth at bedtime as needed (sleep).     ferrous sulfate 325 (65 FE) MG tablet Take 325 mg by mouth daily with supper.     furosemide (LASIX) 40 MG tablet Take 40 mg by mouth daily.     levothyroxine (SYNTHROID) 125 MCG tablet Take 125 mcg by mouth daily before breakfast.      pantoprazole (PROTONIX) 40 MG tablet Take 40 mg by mouth 4 (four) times a week.     Polyethyl Glycol-Propyl Glycol (SYSTANE OP) Place 1 drop into both eyes daily as needed (dry eyes).     tamsulosin (FLOMAX) 0.4 MG CAPS capsule Take 0.8 mg by mouth daily.     tobramycin (TOBREX) 0.3 % ophthalmic solution Place 1 drop into the left eye See admin instructions. Instill 1 drop into left eye 1 day before, the day of, and the day after eye injections administered at Dr's office     valsartan (DIOVAN) 320 MG tablet TAKE 1 TABLET BY MOUTH EVERY DAY 90  tablet 3   warfarin (COUMADIN) 5 MG tablet Take 5-7.5 mg by mouth See admin instructions. Per patient Sunday, Jason Santana, & Saturday taking 7.5 mg = 1 & 1/2 tab. All other days taking 1  tab = 5mg  mg on  Mon, Wed, Friday     No current facility-administered medications for this encounter.    ROS- All systems are reviewed and negative except as per the HPI above.  Physical Exam: Vitals:   10/16/23 0823  BP: (!) 152/58  Pulse: 78  Weight: 84.3 kg   Height: 5\' 10"  (1.778 m)     GEN: Well nourished, well developed in no acute distress NECK: No JVD; No carotid bruits CARDIAC: Regular rate and rhythm, no murmurs, rubs, gallops, mech AV RESPIRATORY:  Clear to auscultation without rales, wheezing or rhonchi  ABDOMEN: Soft, non-tender, non-distended EXTREMITIES:  1+ bilateral lower extremity edema, No deformity    Wt Readings from Last 3 Encounters:  10/16/23 84.3 kg  10/14/23 84.6 kg  10/03/23 83.5 kg    EKG today demonstrates   Accelerated junctional rhythm, LAFB Vent. rate 78 BPM PR interval * ms QRS duration 98 ms QT/QTcB 416/474 ms   Echo 10/15/23 demonstrated   1. Left ventricular ejection fraction, by estimation, is 60 to 65%. The  left ventricle has normal function. The left ventricle has no regional  wall motion abnormalities. There is mild left ventricular hypertrophy.  Left ventricular diastolic parameters are indeterminate.   2. Right ventricular systolic function is low normal. The right  ventricular size is normal. There is moderately elevated pulmonary artery  systolic pressure. The estimated right ventricular systolic pressure is  53.7 mmHg.   3. Left atrial size was mild to moderately dilated.   4. The mitral valve is degenerative. No evidence of mitral valve  regurgitation. Severe mitral annular calcification.   5. Tricuspid valve regurgitation is moderate.   6. Aortic valve regurgitation is not visualized. There is a St. Jude  bioprosthetic valve present in the aortic position. Procedure Date: 2000.  Echo findings are consistent with normal structure and function of the  aortic valve prosthesis. Aortic valve mean gradient measures 10.4 mmHg. Aortic valve Vmax measures 2.16 m/s.   7. There is moderate dilatation of the aortic root, measuring 38 mm.   8. The inferior vena cava is normal in size with <50% respiratory  variability, suggesting right atrial pressure of 8 mmHg.     Epic records are  reviewed at length today    CHA2DS2-VASc Score = 5  The patient's score is based upon: CHF History: 1 HTN History: 1 Diabetes History: 0 Stroke History: 0 Vascular Disease History: 1 Age Score: 2 Gender Score: 0       ASSESSMENT AND PLAN: Persistent Atrial Fibrillation/atrial flutter The patient's CHA2DS2-VASc score is 5, indicating a 7.2% annual risk of stroke.   S/p afib and flutter ablation 05/22/19 ECG from 2/10 showed afib, today his ECG shows a regular rhythm, no flutter waves. ? Accelerated junctional. Will hold on scheduling a DCCV and instead have him wear a 2 week Zio monitor to assess his arrhythmia burden. If he has a high burden of afib, may need to consider AAD. Would avoid class IC with h/o CAD and Multaq with HFpEF. Previously failed amiodarone with elevated LFTs and TSH. Could consider dofetilide.  Continue diltiazem 120 mg BID Continue warfarin  Secondary Hypercoagulable State (ICD10:  D68.69) The patient is at significant risk for stroke/thromboembolism based upon his CHA2DS2-VASc Score of  5.  Continue Warfarin (Coumadin). No bleeding issues.   CAD S/p remote CABG No anginal symptoms Followed by Dr Jason Santana  HTN Mildly elevated today, well controlled at previous visits. Continue to monitor.   AVR (mechanical) Continue warfarin  HFpEF Echo showed EF 60-65% Low normal RV function Patient reports edema has improved but not resolved.  His lasix was temporarily increased on 2/10    Follow up with Dr Jason Santana as scheduled. AF clinic follow up pending monitor results.        [name] PA-C [facility] [address] [place], [place] [address] [phone]

## 2023-10-17 ENCOUNTER — Ambulatory Visit (INDEPENDENT_AMBULATORY_CARE_PROVIDER_SITE_OTHER): Payer: Medicare Other

## 2023-10-17 DIAGNOSIS — Z7901 Long term (current) use of anticoagulants: Secondary | ICD-10-CM | POA: Diagnosis not present

## 2023-10-17 NOTE — Progress Notes (Signed)
Patient was here for a lab visit an ear cleaning

## 2023-10-18 LAB — PROTIME-INR
INR: 2.8 — ABNORMAL HIGH (ref 0.9–1.2)
Prothrombin Time: 28.9 s — ABNORMAL HIGH (ref 9.1–12.0)

## 2023-10-21 ENCOUNTER — Telehealth: Payer: Self-pay

## 2023-10-21 NOTE — Telephone Encounter (Signed)
Pt was called and was given the result note that his INR was within goal.  Please confirm with him the most recent changes to his dosing of warfarin.  I believe it is 5 mg on Monday and Friday and 7.5 mg every other day but that he did not take Mondays dose. If that is the case, let him know we will keep it at that dosing: Nothing on Monday, 5 mg on Friday, and 7.5 mg every other day except those 2 days. I would also like him to schedule a lab visit for an INR check again in approximately 1 week on a Thursday if possible, Friday if Thursday unavailable.   Pt did confirm that he didn't take any Warfarin on last Monday and took 5mg  on Friday and 7.5mg  on the other 5 days. Pt had some push back about skipping today(Monday) dose as he believes that would drop him under 2 and his goal is 2-3. After speaking with Dr. Constance Goltz Pt agrees to take 5mg  today and Friday and continue the 7.5mg  on the other 5 days. Pt scheduled a lab appt for Thursday but the weather is suppose to be bad so I just advised him to call and reschedule if he needed to.

## 2023-10-22 ENCOUNTER — Encounter: Payer: Self-pay | Admitting: Gastroenterology

## 2023-10-24 ENCOUNTER — Other Ambulatory Visit: Payer: Medicare Other

## 2023-10-25 ENCOUNTER — Other Ambulatory Visit: Payer: Self-pay | Admitting: *Deleted

## 2023-10-25 ENCOUNTER — Other Ambulatory Visit: Payer: Self-pay | Admitting: Family Medicine

## 2023-10-25 ENCOUNTER — Other Ambulatory Visit: Payer: Medicare Other

## 2023-10-25 DIAGNOSIS — I4891 Unspecified atrial fibrillation: Secondary | ICD-10-CM

## 2023-10-25 DIAGNOSIS — R600 Localized edema: Secondary | ICD-10-CM

## 2023-10-26 LAB — PROTIME-INR
INR: 2.8 — ABNORMAL HIGH (ref 0.9–1.2)
Prothrombin Time: 28.6 s — ABNORMAL HIGH (ref 9.1–12.0)

## 2023-10-28 ENCOUNTER — Ambulatory Visit: Payer: Medicare Other | Attending: Cardiology | Admitting: Cardiology

## 2023-10-28 ENCOUNTER — Encounter: Payer: Self-pay | Admitting: Cardiology

## 2023-10-28 ENCOUNTER — Telehealth: Payer: Self-pay

## 2023-10-28 VITALS — BP 120/58 | HR 69 | Ht 70.0 in | Wt 184.2 lb

## 2023-10-28 DIAGNOSIS — I4819 Other persistent atrial fibrillation: Secondary | ICD-10-CM | POA: Diagnosis not present

## 2023-10-28 DIAGNOSIS — Z952 Presence of prosthetic heart valve: Secondary | ICD-10-CM | POA: Diagnosis not present

## 2023-10-28 DIAGNOSIS — I4892 Unspecified atrial flutter: Secondary | ICD-10-CM | POA: Diagnosis not present

## 2023-10-28 DIAGNOSIS — D6869 Other thrombophilia: Secondary | ICD-10-CM

## 2023-10-28 DIAGNOSIS — R600 Localized edema: Secondary | ICD-10-CM

## 2023-10-28 DIAGNOSIS — I2510 Atherosclerotic heart disease of native coronary artery without angina pectoris: Secondary | ICD-10-CM | POA: Diagnosis not present

## 2023-10-28 MED ORDER — FUROSEMIDE 40 MG PO TABS: 40.0000 mg | ORAL_TABLET | Freq: Every day | ORAL | 3 refills | Status: DC

## 2023-10-28 NOTE — Telephone Encounter (Signed)
 Theres a BMP order in there from his cardiologist.  If we can't release those I will add a BMP at his lab visit.

## 2023-10-28 NOTE — Patient Instructions (Addendum)
 Medication Instructions:  Please take Furosemide 40 mg once a day. Continue all other medications as listed.  *If you need a refill on your cardiac medications before your next appointment, please call your pharmacy*   Follow-Up: At [facility], you and your health needs are our priority.  As part of our continuing mission to provide you with exceptional heart care, we have created designated Provider Care Teams.  These Care Teams include your primary Cardiologist (physician) and Advanced Practice Providers (APPs -  Physician Assistants and Nurse Practitioners) who all work together to provide you with the care you need, when you need it.  We recommend signing up for the patient portal called "MyChart".  Sign up information is provided on this After Visit Summary.  MyChart is used to connect with patients for Virtual Visits (Telemedicine).  Patients are able to view lab/test results, encounter notes, upcoming appointments, etc.  Non-urgent messages can be sent to your provider as well.   To learn more about what you can do with MyChart, go to [email or web address].    Your next appointment:   6 month(s)  Provider:   Jari Favre, PA-C, Robin Searing, NP, Jacolyn Reedy, PA-C, Eligha Bridegroom, NP, Tereso Newcomer, PA-C, or Perlie Gold, PA-C          May use ACE wraps on feet and legs for swelling

## 2023-10-28 NOTE — Telephone Encounter (Addendum)
 Pt took 5mg  on Monday and Friday and 7.5mg  the other 5 days.   Lab scheduled for Thursday.  Pt went to see Cardiologist this morning and they are ask him to check  kidney function with lab work in a few days can you order those labs?

## 2023-10-28 NOTE — Telephone Encounter (Signed)
 Called pt but VM is not set up yet will try back in a few.

## 2023-10-28 NOTE — Telephone Encounter (Signed)
-----   Message from Jason Santana sent at 10/28/2023  6:30 AM EST ----- Please call the patient and let him know that his INR was 2.8.  Please ask him to confirm what his warfarin regimen was during that week and have him continue that.  I would also like to recheck it again this Thursday or Friday with a lab visit.  I believe per our note, he took 7.5 mg on Saturday and Sunday, nothing on Monday, 7.5 mg on Tuesday Wednesday and Thursday, and 5 mg on Friday

## 2023-10-28 NOTE — Progress Notes (Signed)
 Cardiology Office Note:  .   Date:  10/28/2023  ID:  Jason Santana, DOB 1932/02/20, MRN [id number] PCP: Jason Kitty, MD  [facility] Providers Cardiologist:  Donato Schultz, MD Electrophysiologist:  Will [name], MD     History of Present Illness: .   Jason Santana is a 89 y.o. male Discussed with the use of AI scribe   History of Present Illness   Jason Santana is an 89 year old male with coronary artery disease post CABG and mechanical aortic valve replacement who presents for follow-up.  He has been experiencing swelling in his right knee and legs, describing it as a problem with fluid not being effectively removed. Conservative strategies such as leg elevation and compression stockings have been used, though he finds the stockings too tight. Various diuretics have been tried without full resolution. Swelling in his foot has decreased, allowing him to tie his shoes again. An ultrasound on October 03, 2023, ruled out a DVT as the cause of the swelling.  He has a history of coronary artery disease and underwent a mechanical aortic valve replacement in 2000. He is on warfarin for anticoagulation due to his St. Jude mechanical aortic valve. He has also had a coronary artery bypass. His last echocardiogram showed normal functioning of the mechanical valve and low normal right ventricular function with an estimated right ventricular systolic pressure of 54 mmHg.  He has a history of atrial fibrillation and flutter, with a prior ablation in 2020 and cardioversion in 2019. He was previously on amiodarone, which was stopped due to elevated liver function tests and TSH. He is currently wearing a heart monitor to assess the presence of atrial fibrillation.  He is on furosemide 40 mg daily, which was increased from 20 mg for three days prior to an echocardiogram. He has been taking 20 mg since then. He monitors his blood pressure daily, noting occasional low readings with a diastolic around  58, and a mysterious '3' appearing on the monitor, which he is unsure about.       No CP  Studies Reviewed: .        Results   LABS LDL: 69 (07/25/2023) Triglycerides: 84 (07/25/2023) Hemoglobin A1c: 5.4 (07/25/2023) Hemoglobin: 9.8 (07/25/2023) Creatinine: 1.6 (07/25/2023)  RADIOLOGY Ultrasound: No deep vein thrombosis (10/03/2023)  DIAGNOSTIC Echocardiogram: Low normal right ventricular systolic function, right ventricular systolic pressure 54 mmHg (10/15/2023)     Risk Assessment/Calculations:            Physical Exam:   VS:  BP (!) 120/58   Pulse 69   Ht 5\' 10"  (1.778 m)   Wt 184 lb 3.2 oz (83.6 kg)   SpO2 95%   BMI 26.43 kg/m    Wt Readings from Last 3 Encounters:  10/28/23 184 lb 3.2 oz (83.6 kg)  10/16/23 184 lb (83.5 kg)  10/16/23 185 lb 12.8 oz (84.3 kg)    GEN: Well nourished, well developed in no acute distress NECK: No JVD; No carotid bruits CARDIAC: RRR, sharp S2,  no murmurs, no rubs, no gallops RESPIRATORY:  Clear to auscultation without rales, wheezing or rhonchi  ABDOMEN: Soft, non-tender, non-distended EXTREMITIES:  RLE edema, redness, 3+; No deformity   ASSESSMENT AND PLAN: .    Assessment and Plan    Peripheral Edema Chronic bilateral leg swelling, more pronounced in the left leg. Likely multifactorial due to venous insufficiency post vein harvesting for coronary artery bypass grafting, right ventricular dysfunction, and possible fluid  overload. No evidence of deep vein thrombosis on recent ultrasound. Conservative management with leg elevation, compression stockings, and diuretics has been partially effective. Discussed the use of ACE bandages for additional compression as an alternative to tight compression stockings. Risks of aggressive diuretic therapy include potential renal impairment, which will be monitored. - Continue furosemide 40 mg daily instead of 20mg  - Consider using ACE bandages for additional compression - Refer to vascular  surgery for further evaluation - has appt this week - Monitor kidney function with lab work in a few days - has order  Coronary Artery Disease with Mechanical Aortic Valve Replacement Well-managed status post mechanical aortic valve replacement in 2000 and coronary artery bypass grafting. On chronic anticoagulation with warfarin. Last echocardiogram showed normal functioning mechanical valve. Discussed the importance of continued anticoagulation to prevent thromboembolic events. - Continue warfarin therapy - Monitor INR regularly  Atrial Fibrillation Atrial fibrillation with prior cardioversion and ablation. Currently on heart monitor to assess the frequency of episodes. Previously on amiodarone, discontinued due to elevated liver function tests and thyroid-stimulating hormone. Discussed the potential need for alternative antiarrhythmic therapy based on heart monitor results. - Review heart monitor results to assess atrial fibrillation burden - Consider alternative antiarrhythmic therapy if needed  Chronic Kidney Disease Moderate reduction in kidney function with creatinine at 1.6. Risk of further renal impairment with aggressive diuretic therapy. Discussed the need to monitor kidney function closely while on furosemide. - Monitor kidney function with lab work in a few days  Hypertension Well-controlled with current medication regimen. Blood pressure readings at home are within acceptable range. Discussed the importance of regular monitoring to ensure continued control. - Continue current antihypertensive medications - Monitor blood pressure regularly  General Health Maintenance Up to date with lipid profile and hemoglobin A1c. LDL is well controlled at 69, triglycerides at 84, and hemoglobin A1c at 5.4. Discussed the importance of maintaining these levels to reduce cardiovascular risk. - Continue current lipid-lowering therapy - Maintain regular follow-up for diabetes  screening  Follow-up - Schedule follow-up appointment with APP in 6 months - Follow up with vascular surgery on Thursday - Lab work to monitor kidney function in a few days.               Signed, Donato Schultz, MD

## 2023-10-30 DIAGNOSIS — H353111 Nonexudative age-related macular degeneration, right eye, early dry stage: Secondary | ICD-10-CM | POA: Diagnosis not present

## 2023-10-30 DIAGNOSIS — H35372 Puckering of macula, left eye: Secondary | ICD-10-CM | POA: Diagnosis not present

## 2023-10-30 DIAGNOSIS — H04123 Dry eye syndrome of bilateral lacrimal glands: Secondary | ICD-10-CM | POA: Diagnosis not present

## 2023-10-30 DIAGNOSIS — H348312 Tributary (branch) retinal vein occlusion, right eye, stable: Secondary | ICD-10-CM | POA: Diagnosis not present

## 2023-10-30 DIAGNOSIS — H353221 Exudative age-related macular degeneration, left eye, with active choroidal neovascularization: Secondary | ICD-10-CM | POA: Diagnosis not present

## 2023-10-30 DIAGNOSIS — H43813 Vitreous degeneration, bilateral: Secondary | ICD-10-CM | POA: Diagnosis not present

## 2023-10-31 ENCOUNTER — Ambulatory Visit (HOSPITAL_COMMUNITY)
Admission: RE | Admit: 2023-10-31 | Discharge: 2023-10-31 | Disposition: A | Discharge status: Home / Self Care | Payer: Medicare Other | Source: Ambulatory Visit | Attending: Vascular Surgery | Admitting: Vascular Surgery

## 2023-10-31 ENCOUNTER — Other Ambulatory Visit: Payer: Self-pay | Admitting: *Deleted

## 2023-10-31 ENCOUNTER — Other Ambulatory Visit: Payer: Medicare Other

## 2023-10-31 DIAGNOSIS — I10 Essential (primary) hypertension: Secondary | ICD-10-CM

## 2023-10-31 DIAGNOSIS — R600 Localized edema: Secondary | ICD-10-CM | POA: Diagnosis not present

## 2023-10-31 DIAGNOSIS — I4891 Unspecified atrial fibrillation: Secondary | ICD-10-CM

## 2023-10-31 NOTE — Addendum Note (Signed)
 Addended by: Lamonte Sakai, Dominique Calvey D on: 10/31/2023 10:34 AM   Modules accepted: Orders

## 2023-11-01 LAB — PROTIME-INR
INR: 2.6 — ABNORMAL HIGH (ref 0.9–1.2)
Prothrombin Time: 27.2 s — ABNORMAL HIGH (ref 9.1–12.0)

## 2023-11-01 LAB — BASIC METABOLIC PANEL
BUN/Creatinine Ratio: 15 (ref 10–24)
BUN: 24 mg/dL (ref 8–27)
CO2: 20 mmol/L (ref 20–29)
Calcium: 9 mg/dL (ref 8.6–10.2)
Chloride: 106 mmol/L (ref 96–106)
Creatinine, Ser: 1.57 mg/dL — ABNORMAL HIGH (ref 0.76–1.27)
Glucose: 108 mg/dL — ABNORMAL HIGH (ref 70–99)
Potassium: 4.3 mmol/L (ref 3.5–5.2)
Sodium: 139 mmol/L (ref 134–144)
eGFR: 42 mL/min/{1.73_m2} — ABNORMAL LOW (ref >59)

## 2023-11-07 ENCOUNTER — Ambulatory Visit: Payer: Self-pay | Admitting: Family Medicine

## 2023-11-07 ENCOUNTER — Ambulatory Visit (INDEPENDENT_AMBULATORY_CARE_PROVIDER_SITE_OTHER): Payer: Medicare Other | Admitting: Physician Assistant

## 2023-11-07 VITALS — BP 146/78 | HR 76 | Temp 98.1°F | Resp 18 | Ht 70.0 in | Wt 185.1 lb

## 2023-11-07 DIAGNOSIS — I4819 Other persistent atrial fibrillation: Secondary | ICD-10-CM | POA: Diagnosis not present

## 2023-11-07 DIAGNOSIS — I890 Lymphedema, not elsewhere classified: Secondary | ICD-10-CM

## 2023-11-07 DIAGNOSIS — I872 Venous insufficiency (chronic) (peripheral): Secondary | ICD-10-CM | POA: Diagnosis not present

## 2023-11-07 DIAGNOSIS — R600 Localized edema: Secondary | ICD-10-CM | POA: Diagnosis not present

## 2023-11-07 DIAGNOSIS — M7989 Other specified soft tissue disorders: Secondary | ICD-10-CM

## 2023-11-07 NOTE — Telephone Encounter (Signed)
 Copied from CRM [id number]. Topic: Clinical - Lab/Test Results >> Nov 07, 2023  1:28 PM DeAngela L wrote: Reason for CRM: PT call for his kidney results after he did blood work transferred to nurse triage   Patient states that he has been taking double of his fluid pills and was told to continue by his PCP recently.  He wants to know his PCP's advice on his kidney function tests recently. This RN didn't see any notations about the patient's kidney functions per the PCP to relay to the patient at this time.  Please advise further on if patient should continue his current medication regimen based on his recent lab results for kidney function.   Answer Assessment - Initial Assessment Questions 1. REASON FOR CALL or QUESTION: "What is your reason for calling today?" or "How can I best help you?" or "What question do you have that I can help answer?"     Patient was advised to double his "fluid medication" and he wanted to check about how his kidney function was and if his PCP recommends he still taking that double dose of his fluid medication.  Protocols used: Information Only Call - No Triage-A-AH

## 2023-11-07 NOTE — Progress Notes (Signed)
 Office Note     CC:  follow up Requesting Provider:  Sandre Kitty, MD  HPI: Jason Santana is a 89 y.o. (1932/05/03) male who presents for evaluation of right lower extremity edema and lower leg rash.  He is accompanied today by his wife who states that the rash has been present for about 2-1/2 months.  He has been through several courses of antibiotics for suspected cellulitis.  Antibiotics have not changed the appearance of his lower leg and/or rash.  He uses anti-itch cream which seems to help.  He denies history of DVT, venous ulcerations.  He has a history of right ankle fracture requiring fixation.  He also had his right greater saphenous vein harvested for coronary bypass.  He has worn compression in the past but not currently.  He has not been elevating his legs above the level of his heart.  He denies tobacco use.  He also denies any fevers, chills, nausea/vomiting.   Past Medical History:  Diagnosis Date   Anticoagulation adequate 05/23/2019   Atrial fibrillation (HCC)    CAD (coronary artery disease) of artery bypass graft 2000   2000 with multiple bypasses    Chronic anticoagulation    Coumadin therapy for mechanical aortic valve. Postoperative atrial fibrillation.    Essential hypertension    H/O mechanical aortic valve replacement 2000   [employer] AVR   Hypothyroidism    Premature ventricular contractions    S/P ablation of atrial fibrillation 05/22/19 05/23/2019   Typical atrial flutter (HCC) 05/23/2019    Past Surgical History:  Procedure Laterality Date   AORTIC VALVE REPLACEMENT (AVR)/CORONARY ARTERY BYPASS GRAFTING (CABG)  2000   ATRIAL FIBRILLATION ABLATION N/A 05/22/2019   Procedure: ATRIAL FIBRILLATION ABLATION;  Surgeon: Regan Lemming, MD;  Location: [facility] INVASIVE CV LAB;  Service: Cardiovascular;  Laterality: N/A;   BIOPSY  08/24/2021   Procedure: BIOPSY;  Surgeon: Rachael Fee, MD;  Location: [facility] ENDOSCOPY;  Service: Endoscopy;;   CARDIOVERSION N/A  02/24/2018   Procedure: CARDIOVERSION;  Surgeon: Pricilla Riffle, MD;  Location: [facility] ENDOSCOPY;  Service: Cardiovascular;  Laterality: N/A;   CARDIOVERSION N/A 08/15/2018   Procedure: CARDIOVERSION;  Surgeon: Vesta Mixer, MD;  Location: [facility] ENDOSCOPY;  Service: Cardiovascular;  Laterality: N/A;   COLONOSCOPY WITH PROPOFOL N/A 01/08/2018   Procedure: COLONOSCOPY WITH PROPOFOL;  Surgeon: Willis Modena, MD;  Location: [facility] ENDOSCOPY;  Service: Gastroenterology;  Laterality: N/A;   CORONARY/GRAFT ANGIOGRAPHY N/A 08/14/2018   Procedure: CORONARY/GRAFT ANGIOGRAPHY;  Surgeon: Tonny Bollman, MD;  Location: [facility] INVASIVE CV LAB;  Service: Cardiovascular;  Laterality: N/A;   ESOPHAGOGASTRODUODENOSCOPY (EGD) WITH PROPOFOL N/A 01/06/2018   Procedure: ESOPHAGOGASTRODUODENOSCOPY (EGD) WITH PROPOFOL;  Surgeon: Kathi Der, MD;  Location: [facility] ENDOSCOPY;  Service: Gastroenterology;  Laterality: N/A;   ESOPHAGOGASTRODUODENOSCOPY (EGD) WITH PROPOFOL N/A 08/24/2021   Procedure: ESOPHAGOGASTRODUODENOSCOPY (EGD) WITH PROPOFOL;  Surgeon: Rachael Fee, MD;  Location: [facility] ENDOSCOPY;  Service: Endoscopy;  Laterality: N/A;   FOOT SURGERY Right    x 2    Social History   Socioeconomic History   Marital status: Married    Spouse name: Not on file   Number of children: Not on file   Years of education: Not on file   Highest education level: Not on file  Occupational History   Not on file  Tobacco Use   Smoking status: Former    Types: Cigarettes   Smokeless tobacco: Never   Tobacco comments:    Former smoker (quit 66 years  ago) 05/21/23  Vaping Use   Vaping status: Never Used  Substance and Sexual Activity   Alcohol use: No   Drug use: No   Sexual activity: Not on file  Other Topics Concern   Not on file  Social History Narrative   Not on file   Social Drivers of Health   Financial Resource Strain: Not on file  Food Insecurity: No Food Insecurity (03/28/2022)   Hunger Vital Sign    Worried  About Running Out of Food in the Last Year: Never true    Ran Out of Food in the Last Year: Never true  Transportation Needs: No Transportation Needs (03/28/2022)   PRAPARE - Administrator, Civil Service (Medical): No    Lack of Transportation (Non-Medical): No  Physical Activity: Not on file  Stress: Not on file  Social Connections: Not on file  Intimate Partner Violence: Not on file    Family History  Problem Relation Age of Onset   Hypertension Mother    Heart attack Mother    Hypertension Father    Colon cancer Neg Hx    Esophageal cancer Neg Hx    Pancreatic cancer Neg Hx    Stomach cancer Neg Hx     Current Outpatient Medications  Medication Sig Dispense Refill   atorvastatin (LIPITOR) 10 MG tablet Take 10 mg by mouth daily.     bacitracin ointment Apply 1 Application topically as needed for wound care (for feet and legs).     Cholecalciferol (VITAMIN D) 2000 units tablet Take 2,000 Units by mouth daily.     diltiazem (CARDIZEM) 30 MG tablet TAKE 1 TABLET AS NEEDED FOR PALPITATIONS OR HEART RATE >100 30 tablet 6   diltiazem (TIADYLT ER) 120 MG 24 hr capsule Take 1 capsule (120 mg total) by mouth 2 (two) times daily. 180 capsule 2   diphenhydramine-acetaminophen (TYLENOL PM) 25-500 MG TABS tablet Take 0.5 tablets by mouth at bedtime as needed (sleep).     ferrous sulfate 325 (65 FE) MG tablet Take 325 mg by mouth daily with supper.     furosemide (LASIX) 40 MG tablet Take 1 tablet (40 mg total) by mouth daily. 90 tablet 3   levothyroxine (SYNTHROID) 125 MCG tablet Take 125 mcg by mouth daily before breakfast.      pantoprazole (PROTONIX) 40 MG tablet Take 40 mg by mouth 4 (four) times a week.     Polyethyl Glycol-Propyl Glycol (SYSTANE OP) Place 1 drop into both eyes daily as needed (dry eyes).     tobramycin (TOBREX) 0.3 % ophthalmic solution Place 1 drop into the left eye See admin instructions. Instill 1 drop into left eye 1 day before, the day of, and the day  after eye injections administered at Dr's office     valsartan (DIOVAN) 320 MG tablet TAKE 1 TABLET BY MOUTH EVERY DAY 90 tablet 3   warfarin (COUMADIN) 5 MG tablet Take 5-7.5 mg by mouth See admin instructions. Per patient Sunday, [name], & Saturday taking 7.5 mg = 1 & 1/2 tab. All other days taking 1  tab = 5mg  mg on  Mon, Wed, Friday     No current facility-administered medications for this visit.    Allergies  Allergen Reactions   Carvedilol Shortness Of Breath    wheezing   Lopid [Gemfibrozil] Other (See Comments)    transaminitis   Monopril [Fosinopril] Other (See Comments)    transaminitis   Vicodin [Hydrocodone-Acetaminophen]     Severe  sensitivity   Phenergan [Promethazine Hcl] Other (See Comments)    Severe somnolence.   Zocor [Simvastatin]     Short memory loss.   Benicar [Olmesartan]     Abdominal cramping and increased stools/ irritable bowel   Codeine Nausea And Vomiting   Prilosec [Omeprazole]     dyspepsia     REVIEW OF SYSTEMS:   [X]  denotes positive finding, [ ]  denotes negative finding Cardiac  Comments:  Chest pain or chest pressure:    Shortness of breath upon exertion:    Short of breath when lying flat:    Irregular heart rhythm:        Vascular    Pain in calf, thigh, or hip brought on by ambulation:    Pain in feet at night that wakes you up from your sleep:     Blood clot in your veins:    Leg swelling:         Pulmonary    Oxygen at home:    Productive cough:     Wheezing:         Neurologic    Sudden weakness in arms or legs:     Sudden numbness in arms or legs:     Sudden onset of difficulty speaking or slurred speech:    Temporary loss of vision in one eye:     Problems with dizziness:         Gastrointestinal    Blood in stool:     Vomited blood:         Genitourinary    Burning when urinating:     Blood in urine:        Psychiatric    Major depression:         Hematologic    Bleeding problems:    Problems with  blood clotting too easily:        Skin    Rashes or ulcers:        Constitutional    Fever or chills:      PHYSICAL EXAMINATION:  Vitals:   11/07/23 1034  BP: (!) 146/78  Pulse: 76  Resp: 18  Temp: 98.1 F (36.7 C)  TempSrc: Temporal  SpO2: 95%  Weight: 185 lb 1.6 oz (84 kg)  Height: 5\' 10"  (1.778 m)    General:  WDWN in NAD; vital signs documented above Gait: Not observed HENT: WNL, normocephalic Pulmonary: normal non-labored breathing , without Rales, rhonchi,  wheezing Cardiac: regular HR Abdomen: soft, NT, no masses Skin: with rash both shins R > L Vascular Exam/Pulses: palpable DP pulses Extremities: without ischemic changes, without Gangrene , without cellulitis; without open wounds; hyperpigmentation and indurated skin of both lower legs; edema involves toes of RLE Musculoskeletal: no muscle wasting or atrophy  Neurologic: A&O X 3 Psychiatric:  The pt has Normal affect.   Non-Invasive Vascular Imaging:   Right lower extremity venous reflux study negative for DVT Findings consistent with prior GSV harvest Incompetent small saphenous vein but small in diameter    ASSESSMENT/PLAN:: 89 y.o. male here for evaluation of right lower extremity edema with rash  Mr. Johann Santone is an 89 year old male who presents for evaluation of right lower extremity edema and rash.  Rash has been present for the past 2 and half months.  He has been treated for cellulitis with antibiotics with no change.  This appears to be an opportunistic rash related to chronic edema of the right lower extremity based on venous hyperpigmentation and induration of  the skin.  Right lower extremity venous reflux study was negative for DVT.  Findings were consistent with prior GSV harvest.  He does have an incompetent small saphenous vein throughout its course however it is small in diameter and would not be a candidate for laser ablation therapy.  Reassured the patient and his wife that the rash will  likely resolve with better management of his edema.  He should wear knee-high 15 to 20 mmHg compression socks on a daily basis.  He should also focus on periodic elevation of the legs above the level of the heart during the day.  He should avoid prolonged sitting and standing when possible.  He also likely has a component of lymphedema and may benefit from a referral to a lymphedema clinic from his PCP.  For now he will follow-up on an as-needed basis.   Emilie Rutter, PA-C [facility] [phone]  Clinic MD:   Karin Lieu

## 2023-11-07 NOTE — Addendum Note (Signed)
 Encounter addended by: Shona Simpson, RN on: 11/07/2023 4:35 PM  Actions taken: Imaging Exam ended

## 2023-11-08 NOTE — Telephone Encounter (Signed)
 Please let the patient know that on 2/24 he saw his cardiologist and they recommended he take 40 mg of Lasix once a day.  He has since picked up that prescription.  He should only be taking the 40 mg tablet once a day.  His kidney function tests have been stable over the past 2 months.

## 2023-11-08 NOTE — Telephone Encounter (Signed)
 Called pt on both number no VM set up to LVM

## 2023-11-11 NOTE — Telephone Encounter (Signed)
 Called pt he stated that he is taking his Rx once a day and also he stated that his cardiologist wanted him to repeat his kidney function level at his appt on 11/13/2023

## 2023-11-12 ENCOUNTER — Encounter (HOSPITAL_COMMUNITY): Payer: Self-pay

## 2023-11-13 ENCOUNTER — Ambulatory Visit (INDEPENDENT_AMBULATORY_CARE_PROVIDER_SITE_OTHER): Payer: Medicare Other | Admitting: Family Medicine

## 2023-11-13 ENCOUNTER — Telehealth: Payer: Self-pay | Admitting: Gastroenterology

## 2023-11-13 ENCOUNTER — Encounter: Payer: Self-pay | Admitting: Family Medicine

## 2023-11-13 VITALS — BP 136/58 | HR 70 | Ht 70.0 in | Wt 187.0 lb

## 2023-11-13 DIAGNOSIS — R600 Localized edema: Secondary | ICD-10-CM

## 2023-11-13 DIAGNOSIS — Z7901 Long term (current) use of anticoagulants: Secondary | ICD-10-CM | POA: Diagnosis not present

## 2023-11-13 DIAGNOSIS — N1831 Chronic kidney disease, stage 3a: Secondary | ICD-10-CM | POA: Insufficient documentation

## 2023-11-13 NOTE — Progress Notes (Signed)
   Established Patient Office Visit  Subjective   Patient ID: Jason Santana, male    DOB: 26-Jun-1932  Age: 89 y.o. MRN: [id number]  Chief Complaint  Patient presents with   Medical Management of Chronic Issues    HPI  Extremity edema-patient taking his Lasix 40 mg a day.  We talked about his recent visit to the cardiologist and the vascular surgeon.  He was fitted for a compression stocking at the vascular office and is able to wear these without discomfort.  Warfarin use-patient is taking 5 mg every Monday and Friday and 7.5 mg on all other days.  We talked about getting monthly repeats here  Patient has a follow-up with his gastroenterologist upcoming but is unsure of the date.  This is for his cirrhosis.   The ASCVD Risk score (Arnett DK, et al., 2019) failed to calculate for the following reasons:   The 2019 ASCVD risk score is only valid for ages 37 to 22  Health Maintenance Due  Topic Date Due   COVID-19 Vaccine (1) Never done   Pneumonia Vaccine 60+ Years old (1 of 2 - PCV) 07/23/1938   DTaP/Tdap/Td (1 - Tdap) Never done   Zoster Vaccines- Shingrix (1 of 2) Never done      Objective:     BP (!) 136/58   Pulse 70   Ht 5\' 10"  (1.778 m)   Wt 187 lb (84.8 kg)   SpO2 97%   BMI 26.83 kg/m    Physical Exam General: Alert, oriented CV: Regular rate and rhythm.  Mechanical valve appreciated Pulmonary: Mild left-sided inspiratory crackles.  Right side clear.  No respiratory distress Extremities: Pitting edema bilaterally.  Improved from previous visit.  Wearing compression sock on the right leg   No results found for any visits on 11/13/23.      Assessment & Plan:   Stage 3a chronic kidney disease (HCC) Assessment & Plan: Repeating BMP today and also getting UACR to further quantify his overall kidney function and if a referral to neurology would be appropriate at this time.  Has been stable over the past 2 months after initially increasing from  1.3-1.6.  Orders: -     Microalbumin / creatinine urine ratio -     Basic metabolic panel  Chronic anticoagulation Assessment & Plan: 5 mg warfarin on Monday and Friday, 7.5 mg every other day.  Will get monthly checks at our office.  Will put in a standing order for these.  Orders: -     Protime-INR; Standing  Edema, lower extremity Assessment & Plan: Has seen vein and vascular specialist.  They agreed it is multifactorial most likely considering his surgeries, venous graft and heart failure.  Continue Lasix, continue compression socks and elevation.  Stasis dermatitis present.  Advised patient to inform us if dermatitis worsens or changes.      Return in about 6 months (around 05/15/2024) for CKD, CAD.    Sandre Kitty, MD

## 2023-11-13 NOTE — Assessment & Plan Note (Signed)
 Has seen vein and vascular specialist.  They agreed it is multifactorial most likely considering his surgeries, venous graft and heart failure.  Continue Lasix, continue compression socks and elevation.  Stasis dermatitis present.  Advised patient to inform us if dermatitis worsens or changes.

## 2023-11-13 NOTE — Patient Instructions (Signed)
 It was nice to see you today,  We addressed the following topics today: -No changes were made to your medications today - I will recheck your kidney function and get a test of your urine to look for protein in the urine.  This will help me determine if you need to see a kidney doctor in the near future for your chronic kidney disease - You will need to schedule a INR check in 2 weeks and then monthly after that.  Have a great day,  Frederic Jericho, MD

## 2023-11-13 NOTE — Assessment & Plan Note (Signed)
 5 mg warfarin on Monday and Friday, 7.5 mg every other day.  Will get monthly checks at our office.  Will put in a standing order for these.

## 2023-11-13 NOTE — Telephone Encounter (Signed)
 Pt is not due for [place] until July.He does not want to schedule f/u until he has [place] and can have ov in the same month. Pt instructed to call back in about a month to schedule ov appt for July.

## 2023-11-13 NOTE — Telephone Encounter (Signed)
 Inbound call from patient stating he received a letter that is was time to schedule a office visit. Patient stated that he always has liver imaging prior to coming for an appointment. Advised patient we could schedule him a follow up visit. Patient requesting to speak with nurse prior to scheduling an office visit due to stating he needs imaging prior to an appointment. Please advise, thank you.

## 2023-11-13 NOTE — Assessment & Plan Note (Signed)
 Repeating BMP today and also getting UACR to further quantify his overall kidney function and if a referral to neurology would be appropriate at this time.  Has been stable over the past 2 months after initially increasing from 1.3-1.6.

## 2023-11-14 LAB — BASIC METABOLIC PANEL
BUN/Creatinine Ratio: 14 (ref 10–24)
BUN: 21 mg/dL (ref 8–27)
CO2: 19 mmol/L — ABNORMAL LOW (ref 20–29)
Calcium: 9.2 mg/dL (ref 8.6–10.2)
Chloride: 107 mmol/L — ABNORMAL HIGH (ref 96–106)
Creatinine, Ser: 1.46 mg/dL — ABNORMAL HIGH (ref 0.76–1.27)
Glucose: 107 mg/dL — ABNORMAL HIGH (ref 70–99)
Potassium: 4 mmol/L (ref 3.5–5.2)
Sodium: 142 mmol/L (ref 134–144)
eGFR: 46 mL/min/{1.73_m2} — ABNORMAL LOW (ref >59)

## 2023-11-14 LAB — MICROALBUMIN / CREATININE URINE RATIO
Creatinine, Urine: 181 mg/dL
Microalb/Creat Ratio: 9 mg/g{creat} (ref 0–29)
Microalbumin, Urine: 16.1 ug/mL

## 2023-11-18 ENCOUNTER — Encounter: Payer: Self-pay | Admitting: Family Medicine

## 2023-11-27 ENCOUNTER — Other Ambulatory Visit

## 2023-11-27 DIAGNOSIS — Z7901 Long term (current) use of anticoagulants: Secondary | ICD-10-CM

## 2023-11-28 LAB — PROTIME-INR
INR: 3.8 — ABNORMAL HIGH (ref 0.9–1.2)
Prothrombin Time: 38.7 s — ABNORMAL HIGH (ref 9.1–12.0)

## 2023-11-29 ENCOUNTER — Other Ambulatory Visit

## 2023-11-29 DIAGNOSIS — Z7901 Long term (current) use of anticoagulants: Secondary | ICD-10-CM | POA: Diagnosis not present

## 2023-11-30 LAB — PROTIME-INR
INR: 4.3 — ABNORMAL HIGH (ref 0.9–1.2)
Prothrombin Time: 42.6 s — ABNORMAL HIGH (ref 9.1–12.0)

## 2023-12-03 ENCOUNTER — Telehealth: Payer: Self-pay

## 2023-12-03 NOTE — Telephone Encounter (Signed)
 Copied from CRM [id number]. Topic: Clinical - Lab/Test Results >> Dec 03, 2023  2:05 PM Carlatta H wrote: Reason for CRM: Please call the patient with second INR results

## 2023-12-04 NOTE — Telephone Encounter (Signed)
 Please let the patient know that his INR was elevated again.  He should hold today's dose and then resume his normal dosing.  If he has already taken today's dose he should hold tomorrow's dose and then get his INR rechecked with [place] a week from today.  He will need to schedule a visit for that.

## 2023-12-09 ENCOUNTER — Telehealth: Payer: Self-pay | Admitting: Cardiology

## 2023-12-09 ENCOUNTER — Ambulatory Visit: Payer: Self-pay

## 2023-12-09 MED ORDER — DILTIAZEM HCL ER BEADS 120 MG PO CP24: 120.0000 mg | ORAL_CAPSULE | Freq: Every day | ORAL | Status: DC

## 2023-12-09 NOTE — Telephone Encounter (Signed)
  Chief Complaint: hypotension, fatigue Symptoms: generalized fatigue, mild SOB with exertion, hypotension Frequency: x 1 week Pertinent Negatives: Patient denies LOC, chest pain, palpitations, bleeding, fever, dehydration, dizziness Disposition: [] ED /[] Urgent Care (no appt availability in office) / [x] Appointment(In office/virtual)/ []  [facility]/ [] Home Care/ [x] Refused Recommended Disposition /[] [facility]/ []  Follow-up with PCP Additional Notes: Patient has not taken his BP meds yet today. Advised patient to make appointment, none available with PCP until late May. Offered available locations/providers or urgent care and patient declined. Advised patient to call his cardiologist as well as they prescribe his diltiazem and valsartan. Patient agreeable to call his cardiologist.   Copied from CRM [id number]. Topic: Clinical - Red Word Triage >> Dec 09, 2023  8:14 AM Georgeanna Harrison H wrote: [facility] that prompted transfer to Nurse Triage: Pt called in stating he has been short of breath for about a week, has had a blood pressure reading of 122/48 this morning, and very weak. Reason for Disposition  Diastolic BP < 50 mm Hg  Answer Assessment - Initial Assessment Questions 1. BLOOD PRESSURE: "What is the blood pressure?" "Did you take at least two measurements 5 minutes apart?"     122/48.  2. ONSET: "When did you take your blood pressure?"     This morning around 0800.  3. HOW: "How did you obtain the blood pressure?" (e.g., visiting nurse, automatic home BP monitor)     Automatic home BP monitor.  4. HISTORY: "Do you have a history of low blood pressure?" "What is your blood pressure normally?"     Yes. He states normally 130s/50-60s.  5. MEDICINES: "Are you taking any medications for blood pressure?" If Yes, ask: "Have they been changed recently?"     Valsartan, diltiazem, he states he has not taken them yet today. He denies any recent changes to his medications.  6. PULSE  RATE: "Do you know what your pulse rate is?"      74.  7. OTHER SYMPTOMS: "Have you been sick recently?" "Have you had a recent injury?"     Patient states he feels SOB/winded when he exerts himself, generalized fatigue.  8. PREGNANCY: "Is there any chance you are pregnant?" "When was your last menstrual period?"     N/A.  Protocols used: Blood Pressure - Low-A-AH

## 2023-12-09 NOTE — Telephone Encounter (Signed)
 Pt advised to reduce his Diltiazem 120 mg to ONCE daily, per Dr. Elberta Fortis.  Patient aware I will not send in a new Rx, but will update his medication list.  Advised to call the office with any issues after making the medication decrease.   Patient verbalized understanding and agreeable to plan.

## 2023-12-09 NOTE — Telephone Encounter (Signed)
  Pt c/o medication issue:  1. Name of Medication:   diltiazem (TIADYLT ER) 120 MG 24 hr capsule    2. How are you currently taking this medication (dosage and times per day)?   Take 1 capsule (120 mg total) by mouth 2 (two) times daily.    3. Are you having a reaction (difficulty breathing--STAT)? No   4. What is your medication issue? Pt would like to know if he can take this medication 1x a day instead. He said, his BP is good and he gets SOB on exertion and wanted to decrease his dosage

## 2023-12-10 ENCOUNTER — Other Ambulatory Visit

## 2023-12-10 DIAGNOSIS — Z7901 Long term (current) use of anticoagulants: Secondary | ICD-10-CM

## 2023-12-11 ENCOUNTER — Encounter: Payer: Self-pay | Admitting: Family Medicine

## 2023-12-11 LAB — PROTIME-INR
INR: 3.3 — ABNORMAL HIGH (ref 0.9–1.2)
Prothrombin Time: 33.4 s — ABNORMAL HIGH (ref 9.1–12.0)

## 2023-12-16 ENCOUNTER — Telehealth: Payer: Self-pay | Admitting: Physician Assistant

## 2023-12-16 ENCOUNTER — Telehealth: Payer: Self-pay | Admitting: Cardiology

## 2023-12-16 NOTE — Telephone Encounter (Signed)
   The patient's wife Neoma Banker called the answering service after-hours today. It appears they called earlier today but our office could not get through due to VM being set up.   Wife reports he has had excessive weakness, no energy, and worsening SOB for several days. We discussed that unfortunately this is a spectrum of issues we cannot evaluate over the phone. At 89 years old there could be a multitude of issues going on (anemia, kidney, heart, blood pressure, etc). We discussed proceeding to the ER tonight for evaluation. They are really reluctant to go. They wanted to see if they can be seen in the morning but unfortunately would need to contact the office in AM for availability. I told them the other concern with outpatient follow-up is that with this vague of symptoms he would likely need bloodwork that may have a delay in getting important results back. She reports they've also tried to get into see PCP but cannot get an urgent appt either. She will talk with her husband about going to ER tonight and if he refuses to go they will call in AM for an appt. I will route to triage as FYI to assist if he does not end up going to ED.   Flavius Repsher N Callin Ashe, PA-C

## 2023-12-16 NOTE — Telephone Encounter (Signed)
 Voicemail not set up.

## 2023-12-16 NOTE — Telephone Encounter (Signed)
 Received answering service page. Noted another phone note already open on this. Will document on that encounter. Closing this one.

## 2023-12-16 NOTE — Telephone Encounter (Signed)
 Shortness Of Breath: STAT if SOB developed within the last 24 hours or pt is noticeably SOB on the phone  1. Are you currently SOB (can you hear that pt is SOB on the phone)? No   2. How long have you been experiencing SOB? Only when active   3. Are you SOB when sitting or when up moving around? Moving   4. Are you currently experiencing any other symptoms? Very weak for several days

## 2023-12-17 ENCOUNTER — Emergency Department (HOSPITAL_COMMUNITY)

## 2023-12-17 ENCOUNTER — Other Ambulatory Visit: Payer: Self-pay

## 2023-12-17 ENCOUNTER — Encounter (HOSPITAL_COMMUNITY): Payer: Self-pay

## 2023-12-17 ENCOUNTER — Inpatient Hospital Stay (HOSPITAL_COMMUNITY)
Admission: EM | Admit: 2023-12-17 | Discharge: 2023-12-28 | DRG: 377 | Disposition: A | Discharge status: Hospice | Attending: Internal Medicine | Admitting: Internal Medicine

## 2023-12-17 DIAGNOSIS — R7989 Other specified abnormal findings of blood chemistry: Secondary | ICD-10-CM

## 2023-12-17 DIAGNOSIS — Z515 Encounter for palliative care: Secondary | ICD-10-CM | POA: Diagnosis not present

## 2023-12-17 DIAGNOSIS — D6832 Hemorrhagic disorder due to extrinsic circulating anticoagulants: Secondary | ICD-10-CM | POA: Diagnosis present

## 2023-12-17 DIAGNOSIS — I130 Hypertensive heart and chronic kidney disease with heart failure and stage 1 through stage 4 chronic kidney disease, or unspecified chronic kidney disease: Secondary | ICD-10-CM | POA: Diagnosis not present

## 2023-12-17 DIAGNOSIS — I25810 Atherosclerosis of coronary artery bypass graft(s) without angina pectoris: Secondary | ICD-10-CM | POA: Diagnosis not present

## 2023-12-17 DIAGNOSIS — I4819 Other persistent atrial fibrillation: Secondary | ICD-10-CM | POA: Diagnosis present

## 2023-12-17 DIAGNOSIS — R0601 Orthopnea: Secondary | ICD-10-CM | POA: Diagnosis not present

## 2023-12-17 DIAGNOSIS — I071 Rheumatic tricuspid insufficiency: Secondary | ICD-10-CM | POA: Diagnosis present

## 2023-12-17 DIAGNOSIS — S7001XA Contusion of right hip, initial encounter: Secondary | ICD-10-CM | POA: Diagnosis not present

## 2023-12-17 DIAGNOSIS — N1832 Chronic kidney disease, stage 3b: Secondary | ICD-10-CM | POA: Diagnosis present

## 2023-12-17 DIAGNOSIS — Z952 Presence of prosthetic heart valve: Secondary | ICD-10-CM | POA: Diagnosis not present

## 2023-12-17 DIAGNOSIS — T45515A Adverse effect of anticoagulants, initial encounter: Secondary | ICD-10-CM | POA: Diagnosis present

## 2023-12-17 DIAGNOSIS — Z885 Allergy status to narcotic agent status: Secondary | ICD-10-CM

## 2023-12-17 DIAGNOSIS — Z79899 Other long term (current) drug therapy: Secondary | ICD-10-CM

## 2023-12-17 DIAGNOSIS — D500 Iron deficiency anemia secondary to blood loss (chronic): Secondary | ICD-10-CM | POA: Diagnosis not present

## 2023-12-17 DIAGNOSIS — K2971 Gastritis, unspecified, with bleeding: Secondary | ICD-10-CM | POA: Diagnosis present

## 2023-12-17 DIAGNOSIS — I700 Atherosclerosis of aorta: Secondary | ICD-10-CM | POA: Diagnosis not present

## 2023-12-17 DIAGNOSIS — N179 Acute kidney failure, unspecified: Secondary | ICD-10-CM | POA: Diagnosis present

## 2023-12-17 DIAGNOSIS — I5023 Acute on chronic systolic (congestive) heart failure: Secondary | ICD-10-CM | POA: Diagnosis not present

## 2023-12-17 DIAGNOSIS — R0600 Dyspnea, unspecified: Secondary | ICD-10-CM | POA: Diagnosis not present

## 2023-12-17 DIAGNOSIS — R54 Age-related physical debility: Secondary | ICD-10-CM | POA: Diagnosis present

## 2023-12-17 DIAGNOSIS — I2489 Other forms of acute ischemic heart disease: Secondary | ICD-10-CM | POA: Diagnosis present

## 2023-12-17 DIAGNOSIS — Z87891 Personal history of nicotine dependence: Secondary | ICD-10-CM

## 2023-12-17 DIAGNOSIS — Z7189 Other specified counseling: Secondary | ICD-10-CM | POA: Diagnosis not present

## 2023-12-17 DIAGNOSIS — S7011XA Contusion of right thigh, initial encounter: Secondary | ICD-10-CM | POA: Diagnosis not present

## 2023-12-17 DIAGNOSIS — D649 Anemia, unspecified: Principal | ICD-10-CM

## 2023-12-17 DIAGNOSIS — Q2730 Arteriovenous malformation, site unspecified: Secondary | ICD-10-CM | POA: Diagnosis not present

## 2023-12-17 DIAGNOSIS — Z66 Do not resuscitate: Secondary | ICD-10-CM | POA: Diagnosis present

## 2023-12-17 DIAGNOSIS — K219 Gastro-esophageal reflux disease without esophagitis: Secondary | ICD-10-CM | POA: Diagnosis present

## 2023-12-17 DIAGNOSIS — D539 Nutritional anemia, unspecified: Secondary | ICD-10-CM | POA: Diagnosis present

## 2023-12-17 DIAGNOSIS — R627 Adult failure to thrive: Secondary | ICD-10-CM | POA: Diagnosis present

## 2023-12-17 DIAGNOSIS — B954 Other streptococcus as the cause of diseases classified elsewhere: Secondary | ICD-10-CM | POA: Diagnosis not present

## 2023-12-17 DIAGNOSIS — K7460 Unspecified cirrhosis of liver: Secondary | ICD-10-CM | POA: Diagnosis not present

## 2023-12-17 DIAGNOSIS — D62 Acute posthemorrhagic anemia: Secondary | ICD-10-CM | POA: Diagnosis present

## 2023-12-17 DIAGNOSIS — M79661 Pain in right lower leg: Secondary | ICD-10-CM | POA: Diagnosis not present

## 2023-12-17 DIAGNOSIS — R000 Tachycardia, unspecified: Secondary | ICD-10-CM | POA: Diagnosis not present

## 2023-12-17 DIAGNOSIS — J189 Pneumonia, unspecified organism: Secondary | ICD-10-CM | POA: Diagnosis not present

## 2023-12-17 DIAGNOSIS — R609 Edema, unspecified: Secondary | ICD-10-CM | POA: Diagnosis not present

## 2023-12-17 DIAGNOSIS — R918 Other nonspecific abnormal finding of lung field: Secondary | ICD-10-CM | POA: Diagnosis not present

## 2023-12-17 DIAGNOSIS — N189 Chronic kidney disease, unspecified: Secondary | ICD-10-CM | POA: Diagnosis not present

## 2023-12-17 DIAGNOSIS — I4892 Unspecified atrial flutter: Secondary | ICD-10-CM | POA: Diagnosis not present

## 2023-12-17 DIAGNOSIS — I359 Nonrheumatic aortic valve disorder, unspecified: Secondary | ICD-10-CM | POA: Diagnosis not present

## 2023-12-17 DIAGNOSIS — I110 Hypertensive heart disease with heart failure: Secondary | ICD-10-CM | POA: Diagnosis not present

## 2023-12-17 DIAGNOSIS — Z7401 Bed confinement status: Secondary | ICD-10-CM | POA: Diagnosis not present

## 2023-12-17 DIAGNOSIS — R531 Weakness: Secondary | ICD-10-CM | POA: Diagnosis not present

## 2023-12-17 DIAGNOSIS — K7469 Other cirrhosis of liver: Secondary | ICD-10-CM | POA: Diagnosis present

## 2023-12-17 DIAGNOSIS — K649 Unspecified hemorrhoids: Secondary | ICD-10-CM | POA: Diagnosis present

## 2023-12-17 DIAGNOSIS — A409 Streptococcal sepsis, unspecified: Secondary | ICD-10-CM | POA: Diagnosis not present

## 2023-12-17 DIAGNOSIS — I493 Ventricular premature depolarization: Secondary | ICD-10-CM | POA: Diagnosis present

## 2023-12-17 DIAGNOSIS — I77810 Thoracic aortic ectasia: Secondary | ICD-10-CM | POA: Diagnosis present

## 2023-12-17 DIAGNOSIS — I10 Essential (primary) hypertension: Secondary | ICD-10-CM | POA: Diagnosis present

## 2023-12-17 DIAGNOSIS — I25700 Atherosclerosis of coronary artery bypass graft(s), unspecified, with unstable angina pectoris: Secondary | ICD-10-CM | POA: Diagnosis not present

## 2023-12-17 DIAGNOSIS — R0989 Other specified symptoms and signs involving the circulatory and respiratory systems: Secondary | ICD-10-CM | POA: Diagnosis not present

## 2023-12-17 DIAGNOSIS — K31811 Angiodysplasia of stomach and duodenum with bleeding: Principal | ICD-10-CM | POA: Diagnosis present

## 2023-12-17 DIAGNOSIS — M25551 Pain in right hip: Secondary | ICD-10-CM | POA: Diagnosis not present

## 2023-12-17 DIAGNOSIS — D509 Iron deficiency anemia, unspecified: Secondary | ICD-10-CM | POA: Diagnosis not present

## 2023-12-17 DIAGNOSIS — I5033 Acute on chronic diastolic (congestive) heart failure: Secondary | ICD-10-CM | POA: Diagnosis present

## 2023-12-17 DIAGNOSIS — Z951 Presence of aortocoronary bypass graft: Secondary | ICD-10-CM | POA: Diagnosis not present

## 2023-12-17 DIAGNOSIS — I272 Pulmonary hypertension, unspecified: Secondary | ICD-10-CM | POA: Diagnosis present

## 2023-12-17 DIAGNOSIS — K922 Gastrointestinal hemorrhage, unspecified: Secondary | ICD-10-CM | POA: Insufficient documentation

## 2023-12-17 DIAGNOSIS — J90 Pleural effusion, not elsewhere classified: Secondary | ICD-10-CM | POA: Diagnosis not present

## 2023-12-17 DIAGNOSIS — I509 Heart failure, unspecified: Secondary | ICD-10-CM | POA: Diagnosis not present

## 2023-12-17 DIAGNOSIS — Z7901 Long term (current) use of anticoagulants: Secondary | ICD-10-CM

## 2023-12-17 DIAGNOSIS — I480 Paroxysmal atrial fibrillation: Secondary | ICD-10-CM | POA: Diagnosis not present

## 2023-12-17 DIAGNOSIS — Z79890 Hormone replacement therapy (postmenopausal): Secondary | ICD-10-CM

## 2023-12-17 DIAGNOSIS — M47816 Spondylosis without myelopathy or radiculopathy, lumbar region: Secondary | ICD-10-CM | POA: Diagnosis not present

## 2023-12-17 DIAGNOSIS — N400 Enlarged prostate without lower urinary tract symptoms: Secondary | ICD-10-CM | POA: Diagnosis present

## 2023-12-17 DIAGNOSIS — N1831 Chronic kidney disease, stage 3a: Secondary | ICD-10-CM | POA: Diagnosis present

## 2023-12-17 DIAGNOSIS — I517 Cardiomegaly: Secondary | ICD-10-CM | POA: Diagnosis not present

## 2023-12-17 DIAGNOSIS — E039 Hypothyroidism, unspecified: Secondary | ICD-10-CM | POA: Diagnosis not present

## 2023-12-17 DIAGNOSIS — R6883 Chills (without fever): Secondary | ICD-10-CM | POA: Diagnosis not present

## 2023-12-17 DIAGNOSIS — I4891 Unspecified atrial fibrillation: Secondary | ICD-10-CM | POA: Diagnosis present

## 2023-12-17 DIAGNOSIS — I872 Venous insufficiency (chronic) (peripheral): Secondary | ICD-10-CM | POA: Diagnosis present

## 2023-12-17 DIAGNOSIS — R509 Fever, unspecified: Secondary | ICD-10-CM | POA: Diagnosis not present

## 2023-12-17 DIAGNOSIS — Z888 Allergy status to other drugs, medicaments and biological substances status: Secondary | ICD-10-CM

## 2023-12-17 DIAGNOSIS — E611 Iron deficiency: Secondary | ICD-10-CM | POA: Diagnosis not present

## 2023-12-17 DIAGNOSIS — R7881 Bacteremia: Secondary | ICD-10-CM | POA: Diagnosis not present

## 2023-12-17 DIAGNOSIS — Z8249 Family history of ischemic heart disease and other diseases of the circulatory system: Secondary | ICD-10-CM

## 2023-12-17 DIAGNOSIS — I2510 Atherosclerotic heart disease of native coronary artery without angina pectoris: Secondary | ICD-10-CM | POA: Diagnosis present

## 2023-12-17 LAB — CBC WITH DIFFERENTIAL/PLATELET
Abs Immature Granulocytes: 0.02 10*3/uL (ref 0.00–0.07)
Basophils Absolute: 0 10*3/uL (ref 0.0–0.1)
Basophils Relative: 0 %
Eosinophils Absolute: 0.3 10*3/uL (ref 0.0–0.5)
Eosinophils Relative: 5 %
HCT: 22.4 % — ABNORMAL LOW (ref 39.0–52.0)
Hemoglobin: 6.8 g/dL — CL (ref 13.0–17.0)
Immature Granulocytes: 0 %
Lymphocytes Relative: 17 %
Lymphs Abs: 1 10*3/uL (ref 0.7–4.0)
MCH: 34 pg (ref 26.0–34.0)
MCHC: 30.4 g/dL (ref 30.0–36.0)
MCV: 112 fL — ABNORMAL HIGH (ref 80.0–100.0)
Monocytes Absolute: 0.9 10*3/uL (ref 0.1–1.0)
Monocytes Relative: 14 %
Neutro Abs: 3.7 10*3/uL (ref 1.7–7.7)
Neutrophils Relative %: 64 %
Platelets: 244 10*3/uL (ref 150–400)
RBC: 2 MIL/uL — ABNORMAL LOW (ref 4.22–5.81)
RDW: 15.9 % — ABNORMAL HIGH (ref 11.5–15.5)
WBC: 6 10*3/uL (ref 4.0–10.5)
nRBC: 0 % (ref 0.0–0.2)

## 2023-12-17 LAB — COMPREHENSIVE METABOLIC PANEL WITH GFR
ALT: 18 U/L (ref 0–44)
AST: 63 U/L — ABNORMAL HIGH (ref 15–41)
Albumin: 2.5 g/dL — ABNORMAL LOW (ref 3.5–5.0)
Alkaline Phosphatase: 84 U/L (ref 38–126)
Anion gap: 5 (ref 5–15)
BUN: 27 mg/dL — ABNORMAL HIGH (ref 8–23)
CO2: 21 mmol/L — ABNORMAL LOW (ref 22–32)
Calcium: 8.4 mg/dL — ABNORMAL LOW (ref 8.9–10.3)
Chloride: 112 mmol/L — ABNORMAL HIGH (ref 98–111)
Creatinine, Ser: 1.46 mg/dL — ABNORMAL HIGH (ref 0.61–1.24)
GFR, Estimated: 46 mL/min — ABNORMAL LOW (ref >60)
Glucose, Bld: 133 mg/dL — ABNORMAL HIGH (ref 70–99)
Potassium: 4.4 mmol/L (ref 3.5–5.1)
Sodium: 138 mmol/L (ref 135–145)
Total Bilirubin: 0.6 mg/dL (ref 0.0–1.2)
Total Protein: 5.8 g/dL — ABNORMAL LOW (ref 6.5–8.1)

## 2023-12-17 LAB — PROTIME-INR
INR: 3.7 — ABNORMAL HIGH (ref 0.8–1.2)
Prothrombin Time: 37.2 s — ABNORMAL HIGH (ref 11.4–15.2)

## 2023-12-17 LAB — TROPONIN I (HIGH SENSITIVITY): Troponin I (High Sensitivity): 255 ng/L (ref <18)

## 2023-12-17 LAB — BRAIN NATRIURETIC PEPTIDE: B Natriuretic Peptide: 252.6 pg/mL — ABNORMAL HIGH (ref 0.0–100.0)

## 2023-12-17 LAB — PREPARE RBC (CROSSMATCH)

## 2023-12-17 LAB — POC OCCULT BLOOD, ED: Fecal Occult Bld: POSITIVE — AB

## 2023-12-17 MED ORDER — FUROSEMIDE 10 MG/ML IJ SOLN
40.0000 mg | Freq: Once | INTRAMUSCULAR | Status: AC
Start: 2023-12-17 — End: 2023-12-17
  Administered 2023-12-17: 40 mg via INTRAVENOUS
  Filled 2023-12-17: qty 4

## 2023-12-17 MED ORDER — PANTOPRAZOLE SODIUM 40 MG IV SOLR
80.0000 mg | Freq: Once | INTRAVENOUS | Status: AC
  Administered 2023-12-17: 80 mg via INTRAVENOUS
  Filled 2023-12-17: qty 20

## 2023-12-17 MED ORDER — SODIUM CHLORIDE 0.9% IV SOLUTION: Freq: Once | INTRAVENOUS | Status: DC

## 2023-12-17 NOTE — Telephone Encounter (Signed)
 Wife called again to follow-up on patient's weakness, lack of energy and SOB and requested call back to home phone - [phone].  See previous note.

## 2023-12-17 NOTE — ED Provider Notes (Signed)
 [facility] Provider Note   CSN: [id number] Arrival date & time: 12/17/23  2059     History  Chief Complaint  Patient presents with   Shortness of Breath   Leg Swelling    Jason Santana is a 89 y.o. male.  HPI 89 year old male presents with shortness of breath and weakness. He's been feeling this way for about 10 days.  He gets short of breath when walking, typically about 70 feet.  This limits his activity.  He denies any chest pain or cough.  He has chronic leg swelling that is not improving despite being on Lasix.  He states that most recently his Lasix was cut back from 40 mg down to 20.  Home Medications Prior to Admission medications   Medication Sig Start Date End Date Taking? Authorizing Provider  atorvastatin (LIPITOR) 10 MG tablet Take 10 mg by mouth daily. 09/21/23   [provider]  bacitracin ointment Apply 1 Application topically as needed for wound care (for feet and legs).    [provider]  Cholecalciferol (VITAMIN D) 2000 units tablet Take 2,000 Units by mouth daily.    [provider]  diltiazem (CARDIZEM) 30 MG tablet TAKE 1 TABLET AS NEEDED FOR PALPITATIONS OR HEART RATE >100 10/17/22   Camnitz, Babetta Lesch, MD  diltiazem (TIADYLT ER) 120 MG 24 hr capsule Take 1 capsule (120 mg total) by mouth daily. 12/09/23   Camnitz, Will Gaylyn Keas, MD  diphenhydramine-acetaminophen (TYLENOL PM) 25-500 MG TABS tablet Take 0.5 tablets by mouth at bedtime as needed (sleep).    [provider]  ferrous sulfate 325 (65 FE) MG tablet Take 325 mg by mouth daily with supper.    [provider]  furosemide (LASIX) 40 MG tablet Take 1 tablet (40 mg total) by mouth daily. 10/28/23   Hugh Madura, MD  levothyroxine (SYNTHROID) 125 MCG tablet Take 125 mcg by mouth daily before breakfast.  11/21/18   [provider]  pantoprazole (PROTONIX) 40 MG tablet Take 40 mg by mouth 4 (four) times a week.     [provider]  Polyethyl Glycol-Propyl Glycol (SYSTANE OP) Place 1 drop into both eyes daily as needed (dry eyes).    [provider]  tobramycin (TOBREX) 0.3 % ophthalmic solution Place 1 drop into the left eye See admin instructions. Instill 1 drop into left eye 1 day before, the day of, and the day after eye injections administered at Dr's office    [provider]  valsartan (DIOVAN) 320 MG tablet TAKE 1 TABLET BY MOUTH EVERY DAY 01/24/23   Camnitz, Babetta Lesch, MD  warfarin (COUMADIN) 5 MG tablet Take 5-7.5 mg by mouth See admin instructions. Per patient Sunday, Earle Glatter, & Saturday taking 7.5 mg = 1 & 1/2 tab. All other days taking 1  tab = 5mg  mg on  Mon, Wed, Friday    [provider]      Allergies    Carvedilol, Lopid [gemfibrozil], Monopril [fosinopril], Vicodin [hydrocodone-acetaminophen], Phenergan [promethazine hcl], Zocor [simvastatin], Benicar [olmesartan], Codeine, and Prilosec [omeprazole]    Review of Systems   Review of Systems  Respiratory:  Positive for shortness of breath. Negative for cough.   Cardiovascular:  Positive for leg swelling. Negative for chest pain.  Gastrointestinal:  Negative for abdominal distention and abdominal pain.  Neurological:  Positive for weakness.    Physical Exam Updated Vital Signs BP (!) 115/50   Pulse 77  Temp (!) 97.5 F (36.4 C) (Oral)   Resp 16   Ht 5\' 10"  (1.778 m)   Wt 81.6 kg   SpO2 97%   BMI 25.83 kg/m  Physical Exam Vitals and nursing note reviewed. Exam conducted with a chaperone present.  Constitutional:      Appearance: He is well-developed.  HENT:     Head: Normocephalic and atraumatic.  Cardiovascular:     Rate and Rhythm: Normal rate and regular rhythm.     Heart sounds: Normal heart sounds.  Pulmonary:     Effort: Pulmonary effort is normal. No accessory muscle usage or respiratory distress.     Breath sounds: Decreased breath sounds and rales present.  Abdominal:      Palpations: Abdomen is soft.     Tenderness: There is no abdominal tenderness.  Genitourinary:    Comments: No rectal masses. Light brown stool without gross blood on rectal exam. Musculoskeletal:     Right lower leg: Edema present.     Left lower leg: Edema present.  Skin:    General: Skin is warm and dry.  Neurological:     Mental Status: He is alert.     ED Results / Procedures / Treatments   Labs (all labs ordered are listed, but only abnormal results are displayed) Labs Reviewed  COMPREHENSIVE METABOLIC PANEL WITH GFR - Abnormal; Notable for the following components:      Result Value   Chloride 112 (*)    CO2 21 (*)    Glucose, Bld 133 (*)    BUN 27 (*)    Creatinine, Ser 1.46 (*)    Calcium 8.4 (*)    Total Protein 5.8 (*)    Albumin 2.5 (*)    AST 63 (*)    GFR, Estimated 46 (*)    All other components within normal limits  CBC WITH DIFFERENTIAL/PLATELET - Abnormal; Notable for the following components:   RBC 2.00 (*)    Hemoglobin 6.8 (*)    HCT 22.4 (*)    MCV 112.0 (*)    RDW 15.9 (*)    All other components within normal limits  BRAIN NATRIURETIC PEPTIDE - Abnormal; Notable for the following components:   B Natriuretic Peptide 252.6 (*)    All other components within normal limits  PROTIME-INR - Abnormal; Notable for the following components:   Prothrombin Time 37.2 (*)    INR 3.7 (*)    All other components within normal limits  POC OCCULT BLOOD, ED - Abnormal; Notable for the following components:   Fecal Occult Bld POSITIVE (*)    All other components within normal limits  TROPONIN I (HIGH SENSITIVITY) - Abnormal; Notable for the following components:   Troponin I (High Sensitivity) 255 (*)    All other components within normal limits  TYPE AND SCREEN  PREPARE RBC (CROSSMATCH)  TROPONIN I (HIGH SENSITIVITY)    EKG None  Radiology DG Chest 2 View Result Date: 12/17/2023 CLINICAL DATA:  Dyspnea and CHF. EXAM: CHEST - 2 VIEW COMPARISON:  Chest  x-ray 10/03/2023 FINDINGS: The heart is enlarged. Patient is status post cardiac surgery and valve replacement. There is a small left pleural effusion. There is no focal lung infiltrate or pneumothorax. No acute fractures are seen. IMPRESSION: 1. Cardiomegaly. 2. Small left pleural effusion.  Is Electronically Signed   By: Tyron Gallon M.D.   On: 12/17/2023 22:13    Procedures .Critical Care  Performed by: Jerilynn Montenegro, MD Authorized by: Jerilynn Montenegro, MD  Critical care provider statement:    Critical care time (minutes):  30   Critical care time was exclusive of:  Separately billable procedures and treating other patients   Critical care was necessary to treat or prevent imminent or life-threatening deterioration of the following conditions:  Circulatory failure and cardiac failure   Critical care was time spent personally by me on the following activities:  Development of treatment plan with patient or surrogate, discussions with consultants, evaluation of patient's response to treatment, examination of patient, ordering and review of laboratory studies, ordering and review of radiographic studies, ordering and performing treatments and interventions, pulse oximetry, re-evaluation of patient's condition and review of old charts     Medications Ordered in ED Medications  pantoprazole (PROTONIX) injection 80 mg (has no administration in time range)  0.9 %  sodium chloride infusion (Manually program via Guardrails IV Fluids) (0 mLs Intravenous Hold 12/17/23 2307)  furosemide (LASIX) injection 40 mg (40 mg Intravenous Given 12/17/23 2304)    ED Course/ Medical Decision Making/ A&P                                 Medical Decision Making Amount and/or Complexity of Data Reviewed External Data Reviewed: notes. Labs: ordered.    Details: Hemoglobin 6.8.  Troponin 250. Radiology: ordered and independent interpretation performed.    Details: Mild CHF ECG/medicine tests: ordered and  independent interpretation performed.    Details: No acute changes from baseline  Risk Prescription drug management. Decision regarding hospitalization.   Patient presents with shortness of breath on exertion.  Likely multifactorial including anemia, acute on chronic.  Also likely having CHF, possibly from the anemia.  He is heme positive in the stools he was given a dose of Protonix given his history of upper GI bleeding.  He will also be given some Lasix.  I did run his case by Dr. Hayes Lipps of cardiology given the significantly elevated troponin but he would like to see a second before considering any type of treatment.  This is more likely secondary to the above causes.  He is not having any chest pain.  Patient is on warfarin and his INR is mildly thin at 3.7, but he is not significantly bleeding at this time, likely will just need this held.  I did consult Dr. Elvin Hammer and GI will see in the morning.  Patient will need admission to the hospitalist service, [place].        Final Clinical Impression(s) / ED Diagnoses Final diagnoses:  Symptomatic anemia  Acute congestive heart failure, unspecified heart failure type [facility])    Rx / DC Orders ED Discharge Orders     None         Jerilynn Montenegro, MD 12/17/23 2324

## 2023-12-17 NOTE — Telephone Encounter (Signed)
 Spoke to patient's wife she stated husband has been having sob,weakness for the past several days.He is having swelling in both lower legs and feet but seems alittle better.No chest pain.No fast heart beat.He rested well last night. Appointment scheduled with Dr.Skains 4/22 at 1:20 pm.Advised if symptoms worsen he needs to go to ED.

## 2023-12-17 NOTE — ED Triage Notes (Signed)
 Pt BIB [facility] from home c/o bilateral leg swelling, SHOB that has been increasing over the last week. Per family the patient's cardiologist has been tweaking his lasix and initially he was on 80mg  and they decreased it 7 days ago to 40mg  daily and that is when the [facility] and leg swelling started. Pt has a hx of CHF and a-fib and is on a blood thinner. Pt denies any pain at this time.

## 2023-12-18 DIAGNOSIS — K7469 Other cirrhosis of liver: Secondary | ICD-10-CM

## 2023-12-18 DIAGNOSIS — D539 Nutritional anemia, unspecified: Secondary | ICD-10-CM | POA: Diagnosis not present

## 2023-12-18 DIAGNOSIS — R7989 Other specified abnormal findings of blood chemistry: Secondary | ICD-10-CM | POA: Insufficient documentation

## 2023-12-18 DIAGNOSIS — D62 Acute posthemorrhagic anemia: Secondary | ICD-10-CM | POA: Insufficient documentation

## 2023-12-18 DIAGNOSIS — K7460 Unspecified cirrhosis of liver: Secondary | ICD-10-CM

## 2023-12-18 DIAGNOSIS — K922 Gastrointestinal hemorrhage, unspecified: Secondary | ICD-10-CM

## 2023-12-18 LAB — CBC
HCT: 25.2 % — ABNORMAL LOW (ref 39.0–52.0)
Hemoglobin: 7.7 g/dL — ABNORMAL LOW (ref 13.0–17.0)
MCH: 33.3 pg (ref 26.0–34.0)
MCHC: 30.6 g/dL (ref 30.0–36.0)
MCV: 109.1 fL — ABNORMAL HIGH (ref 80.0–100.0)
Platelets: 225 10*3/uL (ref 150–400)
RBC: 2.31 MIL/uL — ABNORMAL LOW (ref 4.22–5.81)
RDW: 16.6 % — ABNORMAL HIGH (ref 11.5–15.5)
WBC: 5.9 10*3/uL (ref 4.0–10.5)
nRBC: 0 % (ref 0.0–0.2)

## 2023-12-18 LAB — COMPREHENSIVE METABOLIC PANEL WITH GFR
ALT: 20 U/L (ref 0–44)
AST: 49 U/L — ABNORMAL HIGH (ref 15–41)
Albumin: 2.5 g/dL — ABNORMAL LOW (ref 3.5–5.0)
Alkaline Phosphatase: 74 U/L (ref 38–126)
Anion gap: 6 (ref 5–15)
BUN: 24 mg/dL — ABNORMAL HIGH (ref 8–23)
CO2: 21 mmol/L — ABNORMAL LOW (ref 22–32)
Calcium: 8.2 mg/dL — ABNORMAL LOW (ref 8.9–10.3)
Chloride: 112 mmol/L — ABNORMAL HIGH (ref 98–111)
Creatinine, Ser: 1.48 mg/dL — ABNORMAL HIGH (ref 0.61–1.24)
GFR, Estimated: 45 mL/min — ABNORMAL LOW (ref >60)
Glucose, Bld: 109 mg/dL — ABNORMAL HIGH (ref 70–99)
Potassium: 3.7 mmol/L (ref 3.5–5.1)
Sodium: 139 mmol/L (ref 135–145)
Total Bilirubin: 1.1 mg/dL (ref 0.0–1.2)
Total Protein: 5.9 g/dL — ABNORMAL LOW (ref 6.5–8.1)

## 2023-12-18 LAB — MRSA NEXT GEN BY PCR, NASAL: MRSA by PCR Next Gen: NOT DETECTED

## 2023-12-18 LAB — PROTIME-INR
INR: 3.4 — ABNORMAL HIGH (ref 0.8–1.2)
Prothrombin Time: 34.7 s — ABNORMAL HIGH (ref 11.4–15.2)

## 2023-12-18 LAB — FOLATE: Folate: 13 ng/mL (ref >5.9)

## 2023-12-18 LAB — TROPONIN I (HIGH SENSITIVITY): Troponin I (High Sensitivity): 251 ng/L (ref <18)

## 2023-12-18 MED ORDER — FUROSEMIDE 10 MG/ML IJ SOLN
40.0000 mg | Freq: Two times a day (BID) | INTRAMUSCULAR | Status: DC
  Administered 2023-12-18 – 2023-12-19 (×3): 40 mg via INTRAVENOUS
  Filled 2023-12-18 (×3): qty 4

## 2023-12-18 MED ORDER — LEVOTHYROXINE SODIUM 25 MCG PO TABS
125.0000 ug | ORAL_TABLET | Freq: Every day | ORAL | Status: DC
  Administered 2023-12-18 – 2023-12-28 (×10): 125 ug via ORAL
  Filled 2023-12-18 (×10): qty 1

## 2023-12-18 MED ORDER — DILTIAZEM HCL ER BEADS 120 MG PO CP24: 120.0000 mg | ORAL_CAPSULE | Freq: Every day | ORAL | Status: DC

## 2023-12-18 MED ORDER — PANTOPRAZOLE SODIUM 40 MG IV SOLR
40.0000 mg | Freq: Two times a day (BID) | INTRAVENOUS | Status: DC
  Administered 2023-12-18 – 2023-12-22 (×9): 40 mg via INTRAVENOUS
  Filled 2023-12-18 (×9): qty 10

## 2023-12-18 MED ORDER — DILTIAZEM HCL ER COATED BEADS 120 MG PO CP24
120.0000 mg | ORAL_CAPSULE | Freq: Every day | ORAL | Status: DC
  Administered 2023-12-18 – 2023-12-28 (×11): 120 mg via ORAL
  Filled 2023-12-18 (×11): qty 1

## 2023-12-18 NOTE — Progress Notes (Signed)
 Heart Failure Navigator Progress Note  Assessed for Heart & Vascular TOC clinic readiness.  Patient does not meet criteria due to last EF 60-65%, has a scheduled [facility] appointment on 12/24/2023. .   Navigator will sign off at this time.   Randie Bustle, BSN, Scientist, clinical (histocompatibility and immunogenetics) Only

## 2023-12-18 NOTE — ED Notes (Signed)
 Please update daughter  [phone]

## 2023-12-18 NOTE — Progress Notes (Signed)
 Progress Note   Patient: Jason Santana:[id number] DOB: 1932/06/16 DOA: 12/17/2023     1 DOS: the patient was seen and examined on 12/18/2023   Brief hospital course: 89 y.o. male with medical history significant of CAD, A-fib/flutter and mechanical aortic valve replacement on Coumadin, PVCs, CKD stage IIIa, hypertension, cryptogenic cirrhosis, GERD, hypothyroidism presenting with complaints of shortness of breath and bilateral lower extremity edema.  Patient states his doctor had reduced the dose of his home Lasix from 40 mg daily to 20 mg daily a month ago.  Since then he is having progressively worsening dyspnea on exertion and bilateral lower extremity edema. Also with increasing melena outpatient basis.  Came to ED for the same.  Found to have Hgb 6.9 and given 1 unit PRBC  Assessment and Plan  Acute GI bleed Acute on chronic anemia secondary to blood loss Hemoglobin 6.8, previously 9.4 on 10/03/2023.  - GI consulted.  Appreciate input.  FOBT positive, INR 3.4.   - No overt GI bleed - s/p 1 unit PRBC.  Will recheck CBC again in AM - Pt with CAD, so Hgb goal >8 - regular diet, observe for any bleeding.  Holding warfarin   Acute on chronic HFpEF - last Echo normal EF, but decreased right sided ventricular function - Deemed secondary to decrease in outpt diuretic usage - BNP 252 and CXR with cardiomegaly - not currently hypox/on RA - with some bump in troponin reportedly attribtued to strain by cardiologist called by EDP - will consult cardiology for further recommendations.  - monitor weights and I/Os   Elevated troponin History of CAD Troponin 255> 251.  Patient is not endorsing chest pain.   Cardiology feels troponin elevation is likely due to demand ischemia rather than ACS.   Paroxysmal A-fib/flutter History of mechanical aortic valve replacement Hold Coumadin at this time given concern for acute GI bleed and slightly supratherapeutic INR.  Since patient is hemodynamically  stable and no overt bleeding, will hold off giving vitamin K at this time and monitor INR.  Continue diltiazem.   History of PVCs Continue diltiazem.   CKD stage IIIa Creatinine stable, monitor labs.   Hypertension Blood pressure currently stable.  Continue diltiazem and IV Lasix.   Cryptogenic cirrhosis No signs of hepatic encephalopathy or ascites at this time.   Hypothyroidism Continue Synthroid.     Subjective: Patient awake and alert.  Feels much better from respiratory standpoint.  Denies any chest pain or overt GI bleeding.   Physical Exam: Vitals:   12/18/23 0945 12/18/23 1030 12/18/23 1241 12/18/23 1330  BP: 124/63 101/85  96/80  Pulse: 80 81  82  Resp: 18 19  17   Temp:   97.6 F (36.4 C)   TempSrc:   Oral   SpO2: 95% 97%  94%  Weight:      Height:       Gen:  Alert, cooperative patient who appears stated age in no acute distress.  Vital signs reviewed. Cardiac:  Regular rate and rhythm without murmur auscultated.  Good S1/S2. Pulm:  Clear to auscultation bilaterally with good air movement.  No wheezes or rales noted.   Abd:  S/ND/NT Ext:  +3 edema to mid-shins with hemosiderin staining.  Some TTP BL legs.  No redness or warmth Neuro:  awake and fully oriented.  Moving all limbs symmetrically.     Data Reviewed:   Family Communication: wife at bedside.   Disposition: Status is: Inpatient  Planned Discharge Destination: Home  Time spent: 40 minutes  Author: Trenton Frock, MD 12/18/2023 3:57 PM  For on call review [email or web address].

## 2023-12-18 NOTE — Consult Note (Signed)
 Consultation Note   Referring Provider:  [facility] PCP: Laneta Pintos, MD Primary Gastroenterologist: Alvester Johnson, MD     Reason for Consultation: worsening anemia, FOBT+ DOA: 12/17/2023         Hospital Day: 2   ASSESSMENT    89 y.o. year old male with multiple medical problems as listed below. Admitted with acute on chronic heart failure, worsening of chronic anemia  SHOB / acute on chronic heart failure Undergoing diuresis  Acute on chronic macrocytic anemia / FOBT+ on warfarin.  No overt GI bleeding INR 3.4 Rule out occult GI bleeding from AVMs, a new development of portal hypertensive gastropathy. Hgb 6.8 on admission, down from 9.4 in late January.   Cryptogenic cirrhosis.  Has been compensated without  history of jaundice,  ascites,  hepatic encephalopathy, or variceal bleeding.  Afib  / aflutter Mechanical AVR, on warfarin Chronic warfarin Warfarin on hold.   Chronic GERD On daily PPI at home  CKD 3a  See PMH for additional history   PLAN:   --Monitor H&H.  He received a unit of RBCs this a.m. with improvement in hemoglobin to 7.7.  -- Continue twice daily IV pantoprazole -- No endoscopic procedures planned at this point, he can have a diet.  --Probable EGD when stable from cardiac standpoint and when INR improves off warfarin --B12, level, folate level, iron studies.    HPI   Patient last seen in office Nov 2024 for compensated cirrhosis. Recently feeling weak and SOB and presented to ED 4/15.   He was found in ED to have had a decline in hgb by nearly 3 grams since early January.  His stools are chronically dark on iron.  Stools have not been any darker than usual lately.  Heme positive in the ED..  On warfarin.  No NSAID use.  On a daily PPI with good control of GERD symptoms.  He has no GI complaints   Previous GI Studies    Dec 2022  EGD for varices screening --Moderate, non-specific  gastritis. H.pylori negative --No portal HTN changes  Colonoscopy Dr. Evangeline Hilts May 2019 while hospitalized for bleeding showed diverticulosis, hemorrhoids, no polyps.    Labs and Imaging:  Recent Labs    12/17/23 2128 12/18/23 0455  WBC 6.0 5.9  HGB 6.8* 7.7*  HCT 22.4* 25.2*  MCV 112.0* 109.1*  PLT 244 225   Recent Labs    12/17/23 2128 12/18/23 0455  NA 138 139  K 4.4 3.7  CL 112* 112*  CO2 21* 21*  GLUCOSE 133* 109*  BUN 27* 24*  CREATININE 1.46* 1.48*  CALCIUM 8.4* 8.2*   Recent Labs    12/17/23 2128 12/18/23 0455  PROT 5.8* 5.9*  ALBUMIN 2.5* 2.5*  AST 63* 49*  ALT 18 20  ALKPHOS 84 74  BILITOT 0.6 1.1   Recent Labs    12/18/23 0455  INR 3.4*     DG Chest 2 View CLINICAL DATA:  Dyspnea and CHF.  EXAM: CHEST - 2 VIEW  COMPARISON:  Chest x-ray 10/03/2023  FINDINGS: The heart is enlarged. Patient is status post cardiac surgery and valve replacement. There is a small left pleural effusion. There is no focal lung infiltrate or  pneumothorax. No acute fractures are seen.  IMPRESSION: 1. Cardiomegaly. 2. Small left pleural effusion.  Is  Electronically Signed   By: Darliss Cheney M.D.   On: 12/17/2023 22:13    Past Medical History:  Diagnosis Date   Anticoagulation adequate 05/23/2019   Atrial fibrillation (HCC)    CAD (coronary artery disease) of artery bypass graft 2000   2000 with multiple bypasses    Chronic anticoagulation    Coumadin therapy for mechanical aortic valve. Postoperative atrial fibrillation.    Essential hypertension    H/O mechanical aortic valve replacement 2000   [employer] AVR   Hypothyroidism    Premature ventricular contractions    S/P ablation of atrial fibrillation 05/22/19 05/23/2019   Typical atrial flutter (HCC) 05/23/2019    Past Surgical History:  Procedure Laterality Date   AORTIC VALVE REPLACEMENT (AVR)/CORONARY ARTERY BYPASS GRAFTING (CABG)  2000   ATRIAL FIBRILLATION ABLATION N/A 05/22/2019    Procedure: ATRIAL FIBRILLATION ABLATION;  Surgeon: Regan Lemming, MD;  Location: [facility] INVASIVE CV LAB;  Service: Cardiovascular;  Laterality: N/A;   BIOPSY  08/24/2021   Procedure: BIOPSY;  Surgeon: Rachael Fee, MD;  Location: [facility];  Service: Endoscopy;;   CARDIOVERSION N/A 02/24/2018   Procedure: CARDIOVERSION;  Surgeon: Pricilla Riffle, MD;  Location: [facility] ENDOSCOPY;  Service: Cardiovascular;  Laterality: N/A;   CARDIOVERSION N/A 08/15/2018   Procedure: CARDIOVERSION;  Surgeon: Vesta Mixer, MD;  Location: [facility] ENDOSCOPY;  Service: Cardiovascular;  Laterality: N/A;   COLONOSCOPY WITH PROPOFOL N/A 01/08/2018   Procedure: COLONOSCOPY WITH PROPOFOL;  Surgeon: Willis Modena, MD;  Location: [facility] ENDOSCOPY;  Service: Gastroenterology;  Laterality: N/A;   CORONARY/GRAFT ANGIOGRAPHY N/A 08/14/2018   Procedure: CORONARY/GRAFT ANGIOGRAPHY;  Surgeon: Tonny Bollman, MD;  Location[facility] LAB;  Service: Cardiovascular;  Laterality: N/A;   ESOPHAGOGASTRODUODENOSCOPY (EGD) WITH PROPOFOL N/A 01/06/2018   Procedure: ESOPHAGOGASTRODUODENOSCOPY (EGD) WITH PROPOFOL;  Surgeon: Kathi Der, MD;  Location: [facility] ENDOSCOPY;  Service: Gastroenterology;  Laterality: N/A;   ESOPHAGOGASTRODUODENOSCOPY (EGD) WITH PROPOFOL N/A 08/24/2021   Procedure: ESOPHAGOGASTRODUODENOSCOPY (EGD) WITH PROPOFOL;  Surgeon: Rachael Fee, MD;  Location: [facility] ENDOSCOPY;  Service: Endoscopy;  Laterality: N/A;   FOOT SURGERY Right    x 2    Family History  Problem Relation Age of Onset   Hypertension Mother    Heart attack Mother    Hypertension Father    Colon cancer Neg Hx    Esophageal cancer Neg Hx    Pancreatic cancer Neg Hx    Stomach cancer Neg Hx     Prior to Admission medications   Medication Sig Start Date End Date Taking? Authorizing Provider  Cholecalciferol (VITAMIN D) 2000 units tablet Take 2,000 Units by mouth daily.   Yes [provider]  diltiazem (TIADYLT ER) 120 MG 24 hr  capsule Take 1 capsule (120 mg total) by mouth daily. 12/09/23  Yes Camnitz, Will Daphine Deutscher, MD  diphenhydramine-acetaminophen (TYLENOL PM) 25-500 MG TABS tablet Take 0.5 tablets by mouth at bedtime.   Yes [provider]  ferrous sulfate 325 (65 FE) MG tablet Take 325 mg by mouth daily with supper.   Yes [provider]  furosemide (LASIX) 20 MG tablet Take 20 mg by mouth daily. 11/14/23  Yes [provider]  levothyroxine (SYNTHROID) 125 MCG tablet Take 125 mcg by mouth daily before breakfast.  11/21/18  Yes [provider]  pantoprazole (PROTONIX) 40 MG tablet Take 40 mg by mouth every other day.  Yes [provider]  Polyethyl Glycol-Propyl Glycol (SYSTANE OP) Place 1 drop into both eyes daily as needed (dry eyes).   Yes [provider]  tobramycin (TOBREX) 0.3 % ophthalmic solution Place 1 drop into the left eye See admin instructions. Instill 1 drop into left eye 1 day before, the day of, and the day after eye injections administered at Dr's office   Yes [provider]  valsartan (DIOVAN) 320 MG tablet TAKE 1 TABLET BY MOUTH EVERY DAY 01/24/23  Yes Camnitz, Will Daphine Deutscher, MD  warfarin (COUMADIN) 5 MG tablet Take by mouth See admin instructions. Take one (5mg ) tablet on Monday and Friday.  Take one and one-half (7.5mg ) tablets on Sun, Tues, Weds, Thurs, and Sat in the evening with supper.   Yes [provider]  furosemide (LASIX) 40 MG tablet Take 1 tablet (40 mg total) by mouth daily. Patient not taking: Reported on 12/18/2023 10/28/23   Jake Bathe, MD    Current Facility-Administered Medications  Medication Dose Route Frequency Provider Last Rate Last Admin   0.9 %  sodium chloride infusion (Manually program via Guardrails IV Fluids)   Intravenous Once John Giovanni, MD   Held at 12/17/23 2307   diltiazem (CARDIZEM CD) 24 hr capsule 120 mg  120 mg Oral Daily Francena Hanly, RPH   120 mg at 12/18/23 1610   furosemide  (LASIX) injection 40 mg  40 mg Intravenous BID John Giovanni, MD   40 mg at 12/18/23 0815   levothyroxine (SYNTHROID) tablet 125 mcg  125 mcg Oral QAC breakfast John Giovanni, MD   125 mcg at 12/18/23 0815   pantoprazole (PROTONIX) injection 40 mg  40 mg Intravenous Clementeen Hoof, MD   40 mg at 12/18/23 1031   Current Outpatient Medications  Medication Sig Dispense Refill   Cholecalciferol (VITAMIN D) 2000 units tablet Take 2,000 Units by mouth daily.     diltiazem (TIADYLT ER) 120 MG 24 hr capsule Take 1 capsule (120 mg total) by mouth daily.     diphenhydramine-acetaminophen (TYLENOL PM) 25-500 MG TABS tablet Take 0.5 tablets by mouth at bedtime.     ferrous sulfate 325 (65 FE) MG tablet Take 325 mg by mouth daily with supper.     furosemide (LASIX) 20 MG tablet Take 20 mg by mouth daily.     levothyroxine (SYNTHROID) 125 MCG tablet Take 125 mcg by mouth daily before breakfast.      pantoprazole (PROTONIX) 40 MG tablet Take 40 mg by mouth every other day.     Polyethyl Glycol-Propyl Glycol (SYSTANE OP) Place 1 drop into both eyes daily as needed (dry eyes).     tobramycin (TOBREX) 0.3 % ophthalmic solution Place 1 drop into the left eye See admin instructions. Instill 1 drop into left eye 1 day before, the day of, and the day after eye injections administered at Dr's office     valsartan (DIOVAN) 320 MG tablet TAKE 1 TABLET BY MOUTH EVERY DAY 90 tablet 3   warfarin (COUMADIN) 5 MG tablet Take by mouth See admin instructions. Take one (5mg ) tablet on Monday and Friday.  Take one and one-half (7.5mg ) tablets on Sun, Tues, Weds, Thurs, and Sat in the evening with supper.     furosemide (LASIX) 40 MG tablet Take 1 tablet (40 mg total) by mouth daily. (Patient not taking: Reported on 12/18/2023) 90 tablet 3    Allergies as of 12/17/2023 - Review Complete 12/17/2023  Allergen Reaction Noted   Carvedilol  Shortness Of Breath 10/14/2019   Lopid [gemfibrozil] Other (See Comments)  02/25/2014   Monopril [fosinopril] Other (See Comments) 02/25/2014   Vicodin [hydrocodone-acetaminophen]  02/25/2014   Phenergan [promethazine hcl] Other (See Comments) 02/25/2014   Zocor [simvastatin]  02/25/2014   Benicar [olmesartan]  02/25/2014   Codeine Nausea And Vomiting 11/25/2013   Prilosec [omeprazole]  02/25/2014    Social History   Socioeconomic History   Marital status: Married    Spouse name: Not on file   Number of children: Not on file   Years of education: Not on file   Highest education level: Not on file  Occupational History   Not on file  Tobacco Use   Smoking status: Former    Types: Cigarettes   Smokeless tobacco: Never   Tobacco comments:    Former smoker (quit 66 years ago) 05/21/23  Vaping Use   Vaping status: Never Used  Substance and Sexual Activity   Alcohol use: No   Drug use: No   Sexual activity: Not on file  Other Topics Concern   Not on file  Social History Narrative   Not on file   Social Drivers of Health   Financial Resource Strain: Not on file  Food Insecurity: No Food Insecurity (03/28/2022)   Hunger Vital Sign    Worried About Running Out of Food in the Last Year: Never true    Ran Out of Food in the Last Year: Never true  Transportation Needs: No Transportation Needs (03/28/2022)   PRAPARE - Administrator, Civil Service (Medical): No    Lack of Transportation (Non-Medical): No  Physical Activity: Not on file  Stress: Not on file  Social Connections: Not on file  Intimate Partner Violence: Not on file     Code Status   Code Status: Limited: Do not attempt resuscitation (DNR) -DNR-LIMITED -Do Not Intubate/DNI   Review of Systems: All systems reviewed and negative except where noted in HPI.  Physical Exam: Vital signs in last 24 hours: Temp:  [97.5 F (36.4 C)-98.8 F (37.1 C)] 97.6 F (36.4 C) (04/16 0815) Pulse Rate:  [76-85] 81 (04/16 1030) Resp:  [14-24] 19 (04/16 1030) BP: (101-135)/(47-87)  101/85 (04/16 1030) SpO2:  [95 %-100 %] 97 % (04/16 1030) Weight:  [81.6 kg] 81.6 kg (04/15 2104) Last BM Date : 12/17/23  General:  Pleasant over the D Psych:  Cooperative. Normal mood and affect Eyes: Pupils equal Ears:  Normal auditory acuity Nose: No deformity, discharge or lesions Neck:  Supple, no masses felt Lungs:  A few bibasilar crackles.  Heart:  Regular rate, regular rhythm.  Abdomen:  Soft, nondistended, nontender, active bowel sounds, no masses felt Rectal :  Deferred Msk: Symmetrical without gross deformities.  Neurologic:  Alert, oriented, grossly normal neurologically Extremities : BLE pitting edema. Both lower extremities erythematous ( distally). Skin:  Intact without significant lesions.    Intake/Output from previous day: 04/15 0701 - 04/16 0700 In: -  Out: 1100 [Urine:1100] Intake/Output this shift:  Total I/O In: -  Out: 550 [Urine:550]   Mai Schwalbe, NP-C   12/18/2023, 11:51 AM

## 2023-12-18 NOTE — ED Notes (Signed)
 Upon RN initial assessment, patient not receiving blood at time of this RN assuming care.

## 2023-12-18 NOTE — H&P (Signed)
 History and Physical    Jason Santana VQQ:[id number] DOB: 09/04/1931 DOA: 12/17/2023  PCP: Sandre Kitty, MD  Patient coming from: Home  Chief Complaint: Shortness of breath  HPI: Jason Santana is a 89 y.o. male with medical history significant of CAD, A-fib/flutter and mechanical aortic valve replacement on Coumadin, PVCs, CKD stage IIIa, hypertension, cryptogenic cirrhosis, GERD, hypothyroidism presenting with complaints of shortness of breath and bilateral lower extremity edema.  Patient states his doctor had reduced the dose of his home Lasix from 40 mg daily to 20 mg daily a month ago.  Since then he is having progressively worsening dyspnea on exertion and bilateral lower extremity edema.  Denies chest pain.  Denies hematemesis, hematochezia, or hematuria.  Reports chronically dark stools due to taking an iron supplement.  Denies recent change in his Coumadin dose.  Denies alcohol or NSAID use.  He is reporting generalized weakness.  ED Course: Vital signs on arrival: Temperature 97.5 F, pulse 83, respiratory rate 18, blood pressure 135/47, SpO2 96% on room air.  Labs notable for hemoglobin 6.8 (previously 9.4 on 10/03/2023), MCV 112.0, creatinine 1.4 (stable since labs done a month ago), calcium 8.4, albumin 2.5, AST 63 (slightly elevated on labs over 2 months ago as well), ALT/alk phos/T. bili normal, troponin 255> 251, BNP 252, INR 3.7.  FOBT positive but patient had light brown stool and no melena or hematochezia noted on rectal exam.  Chest x-ray showing cardiomegaly and small left pleural effusion.  EKG showing sinus rhythm and T wave inversions in leads I and aVL.  Previous EKG from February 2025 was showing mild ST changes in leads I and aVL.  ED physician discussed the case with cardiologist Dr. Geraldo Pitter who feels ACS is less likely and troponin elevation likely due to demand ischemia.  Gastroenterology also consulted -Dr. Marina Goodell with [facility]. Patient was given IV Lasix 40 mg and IV  Protonix 80 mg in the ED. 1 unit PRBCs ordered.  Review of Systems:  Review of Systems  All other systems reviewed and are negative.   Past Medical History:  Diagnosis Date   Anticoagulation adequate 05/23/2019   Atrial fibrillation (HCC)    CAD (coronary artery disease) of artery bypass graft 2000   2000 with multiple bypasses    Chronic anticoagulation    Coumadin therapy for mechanical aortic valve. Postoperative atrial fibrillation.    Essential hypertension    H/O mechanical aortic valve replacement 2000   [employer] AVR   Hypothyroidism    Premature ventricular contractions    S/P ablation of atrial fibrillation 05/22/19 05/23/2019   Typical atrial flutter (HCC) 05/23/2019    Past Surgical History:  Procedure Laterality Date   AORTIC VALVE REPLACEMENT (AVR)/CORONARY ARTERY BYPASS GRAFTING (CABG)  2000   ATRIAL FIBRILLATION ABLATION N/A 05/22/2019   Procedure: ATRIAL FIBRILLATION ABLATION;  Surgeon: Regan Lemming, MD;  Location: [facility] INVASIVE CV LAB;  Service: Cardiovascular;  Laterality: N/A;   BIOPSY  08/24/2021   Procedure: BIOPSY;  Surgeon: Rachael Fee, MD;  Location: [facility];  Service: Endoscopy;;   CARDIOVERSION N/A 02/24/2018   Procedure: CARDIOVERSION;  Surgeon: Pricilla Riffle, MD;  Location: [facility] ENDOSCOPY;  Service: Cardiovascular;  Laterality: N/A;   CARDIOVERSION N/A 08/15/2018   Procedure: CARDIOVERSION;  Surgeon: Vesta Mixer, MD;  Location: [facility] ENDOSCOPY;  Service: Cardiovascular;  Laterality: N/A;   COLONOSCOPY WITH PROPOFOL N/A 01/08/2018   Procedure: COLONOSCOPY WITH PROPOFOL;  Surgeon: Willis Modena, MD;  Location: [facility] ENDOSCOPY;  Service: Gastroenterology;  Laterality: N/A;   CORONARY/GRAFT ANGIOGRAPHY N/A 08/14/2018   Procedure: CORONARY/GRAFT ANGIOGRAPHY;  Surgeon: Tonny Bollman, MD;  Location: [facility] INVASIVE CV LAB;  Service: Cardiovascular;  Laterality: N/A;   ESOPHAGOGASTRODUODENOSCOPY (EGD) WITH PROPOFOL N/A 01/06/2018   Procedure:  ESOPHAGOGASTRODUODENOSCOPY (EGD) WITH PROPOFOL;  Surgeon: Kathi Der, MD;  Location: [facility] ENDOSCOPY;  Service: Gastroenterology;  Laterality: N/A;   ESOPHAGOGASTRODUODENOSCOPY (EGD) WITH PROPOFOL N/A 08/24/2021   Procedure: ESOPHAGOGASTRODUODENOSCOPY (EGD) WITH PROPOFOL;  Surgeon: Rachael Fee, MD;  Location: [facility] ENDOSCOPY;  Service: Endoscopy;  Laterality: N/A;   FOOT SURGERY Right    x 2     reports that he has quit smoking. His smoking use included cigarettes. He has never used smokeless tobacco. He reports that he does not drink alcohol and does not use drugs.  Allergies  Allergen Reactions   Carvedilol Shortness Of Breath    wheezing   Lopid [Gemfibrozil] Other (See Comments)    transaminitis   Monopril [Fosinopril] Other (See Comments)    transaminitis   Vicodin [Hydrocodone-Acetaminophen]     Severe sensitivity   Phenergan [Promethazine Hcl] Other (See Comments)    Severe somnolence.   Zocor [Simvastatin]     Short memory loss.   Benicar [Olmesartan]     Abdominal cramping and increased stools/ irritable bowel   Codeine Nausea And Vomiting   Prilosec [Omeprazole]     dyspepsia    Family History  Problem Relation Age of Onset   Hypertension Mother    Heart attack Mother    Hypertension Father    Colon cancer Neg Hx    Esophageal cancer Neg Hx    Pancreatic cancer Neg Hx    Stomach cancer Neg Hx     Prior to Admission medications   Medication Sig Start Date End Date Taking? Authorizing Provider  Cholecalciferol (VITAMIN D) 2000 units tablet Take 2,000 Units by mouth daily.   Yes [provider]  diltiazem (TIADYLT ER) 120 MG 24 hr capsule Take 1 capsule (120 mg total) by mouth daily. 12/09/23  Yes Camnitz, Will Daphine Deutscher, MD  diphenhydramine-acetaminophen (TYLENOL PM) 25-500 MG TABS tablet Take 0.5 tablets by mouth at bedtime.   Yes [provider]  ferrous sulfate 325 (65 FE) MG tablet Take 325 mg by mouth daily with supper.   Yes [provider]  furosemide (LASIX) 20 MG tablet Take 20 mg by mouth daily. 11/14/23  Yes [provider]  levothyroxine (SYNTHROID) 125 MCG tablet Take 125 mcg by mouth daily before breakfast.  11/21/18  Yes [provider]  pantoprazole (PROTONIX) 40 MG tablet Take 40 mg by mouth every other day.   Yes [provider]  Polyethyl Glycol-Propyl Glycol (SYSTANE OP) Place 1 drop into both eyes daily as needed (dry eyes).   Yes [provider]  tobramycin (TOBREX) 0.3 % ophthalmic solution Place 1 drop into the left eye See admin instructions. Instill 1 drop into left eye 1 day before, the day of, and the day after eye injections administered at Dr's office   Yes [provider]  valsartan (DIOVAN) 320 MG tablet TAKE 1 TABLET BY MOUTH EVERY DAY 01/24/23  Yes Camnitz, Will Daphine Deutscher, MD  warfarin (COUMADIN) 5 MG tablet Take by mouth See admin instructions. Take one (5mg ) tablet on Monday and Friday.  Take one and one-half (7.5mg ) tablets on Sun, Tues, Weds, Thurs, and Sat in the evening with supper.   Yes [provider]  furosemide (LASIX) 40 MG tablet Take  1 tablet (40 mg total) by mouth daily. Patient not taking: Reported on 12/18/2023 10/28/23   Jake Bathe, MD    Physical Exam: Vitals:   12/17/23 2230 12/17/23 2315 12/18/23 0021 12/18/23 0035  BP: (!) 115/50 (!) 126/50 113/62 (!) 111/58  Pulse: 77 81 82   Resp: 16 (!) 22 20   Temp:   97.8 F (36.6 C) 98.8 F (37.1 C)  TempSrc:   Oral Oral  SpO2: 97% 100% 98%   Weight:      Height:        Physical Exam Vitals reviewed.  Constitutional:      General: He is not in acute distress. HENT:     Head: Normocephalic and atraumatic.  Eyes:     Extraocular Movements: Extraocular movements intact.  Cardiovascular:     Rate and Rhythm: Normal rate and regular rhythm.     Pulses: Normal pulses.  Pulmonary:     Effort: Pulmonary effort is normal. No respiratory distress.     Breath sounds: No  wheezing or rales.  Abdominal:     General: Bowel sounds are normal. There is no distension.     Palpations: Abdomen is soft.     Tenderness: There is no abdominal tenderness. There is no guarding.  Musculoskeletal:     Cervical back: Normal range of motion.     Right lower leg: Edema present.     Left lower leg: Edema present.     Comments: 3+ pitting edema of bilateral lower extremities  Skin:    General: Skin is warm and dry.  Neurological:     General: No focal deficit present.     Mental Status: He is alert and oriented to person, place, and time.     Labs on Admission: I have personally reviewed following labs and imaging studies  CBC: Recent Labs  Lab 12/17/23 2128  WBC 6.0  NEUTROABS 3.7  HGB 6.8*  HCT 22.4*  MCV 112.0*  PLT 244   Basic Metabolic Panel: Recent Labs  Lab 12/17/23 2128  NA 138  K 4.4  CL 112*  CO2 21*  GLUCOSE 133*  BUN 27*  CREATININE 1.46*  CALCIUM 8.4*   GFR: Estimated Creatinine Clearance: 35.4 mL/min (A) (by C-G formula based on SCr of 1.46 mg/dL (H)). Liver Function Tests: Recent Labs  Lab 12/17/23 2128  AST 63*  ALT 18  ALKPHOS 84  BILITOT 0.6  PROT 5.8*  ALBUMIN 2.5*   No results for input(s): "LIPASE", "AMYLASE" in the last 168 hours. No results for input(s): "AMMONIA" in the last 168 hours. Coagulation Profile: Recent Labs  Lab 12/17/23 2232  INR 3.7*   Cardiac Enzymes: No results for input(s): "CKTOTAL", "CKMB", "CKMBINDEX", "TROPONINI" in the last 168 hours. BNP (last 3 results) No results for input(s): "PROBNP" in the last 8760 hours. HbA1C: No results for input(s): "HGBA1C" in the last 72 hours. CBG: No results for input(s): "GLUCAP" in the last 168 hours. Lipid Profile: No results for input(s): "CHOL", "HDL", "LDLCALC", "TRIG", "CHOLHDL", "LDLDIRECT" in the last 72 hours. Thyroid Function Tests: No results for input(s): "TSH", "T4TOTAL", "FREET4", "T3FREE", "THYROIDAB" in the last 72 hours. Anemia  Panel: No results for input(s): "VITAMINB12", "FOLATE", "FERRITIN", "TIBC", "IRON", "RETICCTPCT" in the last 72 hours. Urine analysis:    Component Value Date/Time   COLORURINE YELLOW 06/12/2021 2113   APPEARANCEUR CLEAR 06/12/2021 2113   LABSPEC 1.010 06/12/2021 2113   PHURINE 5.0 06/12/2021 2113   GLUCOSEU NEGATIVE 06/12/2021 2113  HGBUR NEGATIVE 06/12/2021 2113   BILIRUBINUR negative 12/15/2021 1002   KETONESUR negative 12/15/2021 1002   KETONESUR NEGATIVE 06/12/2021 2113   PROTEINUR negative 12/15/2021 1002   PROTEINUR NEGATIVE 06/12/2021 2113   UROBILINOGEN 1.0 12/15/2021 1002   NITRITE Negative 12/15/2021 1002   NITRITE NEGATIVE 06/12/2021 2113   LEUKOCYTESUR Negative 12/15/2021 1002   LEUKOCYTESUR TRACE (A) 06/12/2021 2113    Radiological Exams on Admission: DG Chest 2 View Result Date: 12/17/2023 CLINICAL DATA:  Dyspnea and CHF. EXAM: CHEST - 2 VIEW COMPARISON:  Chest x-ray 10/03/2023 FINDINGS: The heart is enlarged. Patient is status post cardiac surgery and valve replacement. There is a small left pleural effusion. There is no focal lung infiltrate or pneumothorax. No acute fractures are seen. IMPRESSION: 1. Cardiomegaly. 2. Small left pleural effusion.  Is Electronically Signed   By: Tyron Gallon M.D.   On: 12/17/2023 22:13    Assessment and Plan  Acute GI bleed Acute on chronic anemia secondary to blood loss Hemoglobin 6.8, previously 9.4 on 10/03/2023. FOBT positive but patient had light brown stool and no melena or hematochezia noted on rectal exam done in the ED.  Currently hemodynamically stable.  EGD done in December 2022 showing moderate gastritis and no signs of portal hypertension/no varices.  Patient denies alcohol or NSAID use.  He is on Coumadin due to history of mechanical aortic valve replacement and INR slightly supratherapeutic at 3.7.  Hold Coumadin and monitor INR.  Continue IV Protonix 40 mg every 12 hours.  Keep n.p.o., [facility] consulted.  Receiving  1 unit PRBCs, follow-up posttransfusion H&H.  Continue to transfuse if hemoglobin less than 7.  Acute on chronic HFpEF Patient is reporting progressively worsening dyspnea on exertion and bilateral lower extremity edema for the past few weeks since after dose of his home Lasix was reduced.  BNP 252.  Chest x-ray showing cardiomegaly and small left pleural effusion.  Not hypoxic. Echo done 10/15/2023 showing EF 60 to 65% and moderate tricuspid regurgitation.  Continue diuresis with IV Lasix 40 mg twice daily.  Monitor intake and output, daily weights.  Elevated troponin History of CAD EKG showing sinus rhythm and T wave inversions in leads I and aVL.  Previous EKG from February 2025 was showing mild ST changes in leads I and aVL. Troponin 255> 251.  Patient is not endorsing chest pain.  Cardiology feels troponin elevation is likely due to demand ischemia rather than ACS.  Paroxysmal A-fib/flutter History of mechanical aortic valve replacement Hold Coumadin at this time given concern for acute GI bleed and slightly supratherapeutic INR.  Since patient is hemodynamically stable and no overt bleeding, will hold off giving vitamin K at this time and monitor INR.  Continue diltiazem.  History of PVCs Continue diltiazem.  CKD stage IIIa Creatinine stable, monitor labs.  Hypertension Blood pressure currently stable.  Continue diltiazem and IV Lasix.  Cryptogenic cirrhosis No signs of hepatic encephalopathy or ascites at this time.  Hypothyroidism Continue Synthroid.  DVT prophylaxis: SCDs Code Status: DNR/DNI (discussed with the patient) Family Communication: No family available at this time. Consults called: [facility] Level of care: Progressive Care Unit Admission status: It is my clinical opinion that admission to INPATIENT is reasonable and necessary because of the expectation that this patient will require hospital care that crosses at least 2 midnights to treat this condition based on  the medical complexity of the problems presented.  Given the aforementioned information, the predictability of an adverse outcome is felt to  be significant.   Juliette Oh MD [facility]  If 7PM-7AM, please contact night-coverage [email or web address]  12/18/2023, 1:00 AM

## 2023-12-18 NOTE — ED Notes (Signed)
Pt bed linen and gown changed.

## 2023-12-19 ENCOUNTER — Encounter (HOSPITAL_COMMUNITY): Payer: Self-pay | Admitting: Internal Medicine

## 2023-12-19 DIAGNOSIS — J90 Pleural effusion, not elsewhere classified: Secondary | ICD-10-CM

## 2023-12-19 DIAGNOSIS — D500 Iron deficiency anemia secondary to blood loss (chronic): Secondary | ICD-10-CM | POA: Diagnosis not present

## 2023-12-19 DIAGNOSIS — D509 Iron deficiency anemia, unspecified: Secondary | ICD-10-CM

## 2023-12-19 DIAGNOSIS — K7469 Other cirrhosis of liver: Secondary | ICD-10-CM | POA: Diagnosis not present

## 2023-12-19 DIAGNOSIS — R7989 Other specified abnormal findings of blood chemistry: Secondary | ICD-10-CM

## 2023-12-19 DIAGNOSIS — I5033 Acute on chronic diastolic (congestive) heart failure: Secondary | ICD-10-CM | POA: Diagnosis not present

## 2023-12-19 DIAGNOSIS — I359 Nonrheumatic aortic valve disorder, unspecified: Secondary | ICD-10-CM

## 2023-12-19 DIAGNOSIS — K922 Gastrointestinal hemorrhage, unspecified: Secondary | ICD-10-CM | POA: Diagnosis not present

## 2023-12-19 LAB — BASIC METABOLIC PANEL WITH GFR
Anion gap: 10 (ref 5–15)
BUN: 22 mg/dL (ref 8–23)
CO2: 22 mmol/L (ref 22–32)
Calcium: 8.4 mg/dL — ABNORMAL LOW (ref 8.9–10.3)
Chloride: 108 mmol/L (ref 98–111)
Creatinine, Ser: 1.56 mg/dL — ABNORMAL HIGH (ref 0.61–1.24)
GFR, Estimated: 42 mL/min — ABNORMAL LOW (ref >60)
Glucose, Bld: 103 mg/dL — ABNORMAL HIGH (ref 70–99)
Potassium: 3.7 mmol/L (ref 3.5–5.1)
Sodium: 140 mmol/L (ref 135–145)

## 2023-12-19 LAB — VITAMIN B12: Vitamin B-12: 603 pg/mL (ref 180–914)

## 2023-12-19 LAB — IRON AND TIBC
Iron: 19 ug/dL — ABNORMAL LOW (ref 45–182)
Saturation Ratios: 5 % — ABNORMAL LOW (ref 17.9–39.5)
TIBC: 357 ug/dL (ref 250–450)
UIBC: 338 ug/dL

## 2023-12-19 LAB — CBC
HCT: 23.4 % — ABNORMAL LOW (ref 39.0–52.0)
Hemoglobin: 7.5 g/dL — ABNORMAL LOW (ref 13.0–17.0)
MCH: 33.6 pg (ref 26.0–34.0)
MCHC: 32.1 g/dL (ref 30.0–36.0)
MCV: 104.9 fL — ABNORMAL HIGH (ref 80.0–100.0)
Platelets: 224 10*3/uL (ref 150–400)
RBC: 2.23 MIL/uL — ABNORMAL LOW (ref 4.22–5.81)
RDW: 15.9 % — ABNORMAL HIGH (ref 11.5–15.5)
WBC: 6.7 10*3/uL (ref 4.0–10.5)
nRBC: 0 % (ref 0.0–0.2)

## 2023-12-19 LAB — PROTIME-INR
INR: 2.9 — ABNORMAL HIGH (ref 0.8–1.2)
Prothrombin Time: 30.3 s — ABNORMAL HIGH (ref 11.4–15.2)

## 2023-12-19 LAB — PREPARE RBC (CROSSMATCH)

## 2023-12-19 LAB — FERRITIN: Ferritin: 21 ng/mL — ABNORMAL LOW (ref 24–336)

## 2023-12-19 MED ORDER — SODIUM CHLORIDE 0.9% IV SOLUTION: Freq: Once | INTRAVENOUS | Status: AC

## 2023-12-19 MED ORDER — SODIUM CHLORIDE (PF) 0.9 % IJ SOLN
INTRAMUSCULAR | Status: AC
  Administered 2023-12-19: 10 mL
  Filled 2023-12-19: qty 10

## 2023-12-19 MED ORDER — GUAIFENESIN-DM 100-10 MG/5ML PO SYRP
5.0000 mL | ORAL_SOLUTION | ORAL | Status: DC | PRN
  Administered 2023-12-20: 5 mL via ORAL
  Filled 2023-12-19 (×2): qty 5

## 2023-12-19 MED ORDER — LORATADINE 10 MG PO TABS: 10.0000 mg | ORAL_TABLET | Freq: Every day | ORAL | Status: DC | PRN

## 2023-12-19 NOTE — TOC Initial Note (Signed)
 Transition of Care [facility]) - Initial/Assessment Note    Patient Details  Name: Jason Santana MRN: [id number] Date of Birth: [birth year]  [facility]) CM/SW Contact:    Juliane Och, LCSW Phone Number: 12/19/2023, 2:30 PM  Clinical Narrative:                  2:30 PM CSW introduced self and role to patient at bedside. Patient stated he lives at home with spouse who could provide transportation and home support if needed upon discharge. Patient declined SNF/HH history but stated he has a wheelchair, walker, and crutches at home that were provided approximately 20 years ago. No TOC needs identified at this time.    Barriers to Discharge: Continued Medical Work up   Patient Goals and CMS Choice            Expected Discharge Plan and Services       Living arrangements for the past 2 months: Single Family Home                                      Prior Living Arrangements/Services Living arrangements for the past 2 months: Single Family Home Lives with:: Spouse Patient language and need for interpreter reviewed:: Yes Do you feel safe going back to the place where you live?: Yes            Criminal Activity/Legal Involvement Pertinent to Current Situation/Hospitalization: No - Comment as needed  Activities of Daily Living   ADL Screening (condition at time of admission) Independently performs ADLs?: Yes (appropriate for developmental age) Is the patient deaf or have difficulty hearing?: No Does the patient have difficulty seeing, even when wearing glasses/contacts?: No Does the patient have difficulty concentrating, remembering, or making decisions?: No  Permission Sought/Granted Permission sought to share information with : Family Supports Permission granted to share information with : No (Contact information on chart)  Share Information with NAME: Henrry Feil     Permission granted to share info w Relationship: Spouse  Permission granted to  share info w Contact Information: [phone]  Emotional Assessment Appearance:: Appears stated age Attitude/Demeanor/Rapport: Engaged Affect (typically observed): Accepting, Adaptable, Pleasant, Stable, Appropriate, Calm Orientation: : Oriented to Self, Oriented to Place, Oriented to  Time, Oriented to Situation Alcohol / Substance Use: Not Applicable Psych Involvement: No (comment)  Admission diagnosis:  Symptomatic anemia [D64.9] Acute congestive heart failure, unspecified heart failure type (HCC) [I50.9] Acute on chronic heart failure with preserved ejection fraction (HFpEF) (HCC) [I50.33] Patient Active Problem List   Diagnosis Date Noted   Acute blood loss anemia 12/18/2023   Elevated troponin 12/18/2023   Cirrhosis of liver without ascites (HCC) 12/18/2023   Acute on chronic heart failure with preserved ejection fraction (HFpEF) (HCC) 12/17/2023   Stage 3a chronic kidney disease (HCC) 11/13/2023   Edema, lower extremity 10/16/2023   Hypercoagulable state due to persistent atrial fibrillation (HCC) 02/07/2023   Symptomatic anemia 02/27/2020   Thoracic aortic aneurysm (HCC) 11/08/2019   Anticoagulation adequate 05/23/2019   Typical atrial flutter (HCC) 05/23/2019   S/P ablation of atrial fibrillation 05/22/19 05/23/2019   Atrial fibrillation (HCC) 05/22/2019   Coronary artery disease involving native coronary artery of native heart with unstable angina pectoris (HCC) 01/04/2018   Acute GI bleeding 01/03/2018   Macular degeneration of left eye 05/26/2017   Hypothyroidism 05/26/2017   Hyponatremia 05/25/2017   Muscle weakness 12/13/2014  Paroxysmal atrial flutter (HCC) 11/25/2013   Essential hypertension 11/25/2013   H/O mechanical aortic valve replacement 11/25/2013   CAD (coronary artery disease) of artery bypass graft 11/25/2013   Chronic anticoagulation 11/25/2013   Premature ventricular contractions 11/25/2013   PCP:  Laneta Pintos, MD Pharmacy:   [id number] - [place], [place] - [address] [place] [address] Phone: [phone] Fax: [phone]     Social Drivers of Health (SDOH) Social History: SDOH Screenings   Food Insecurity: No Food Insecurity (12/18/2023)  Housing: Low Risk  (12/18/2023)  Transportation Needs: No Transportation Needs (12/18/2023)  Utilities: Not At Risk (12/18/2023)  Depression (PHQ2-9): Low Risk  (11/13/2023)  Social Connections: Socially Integrated (12/18/2023)  Tobacco Use: Medium Risk (12/17/2023)   SDOH Interventions:     Readmission Risk Interventions     No data to display

## 2023-12-19 NOTE — H&P (View-Only) (Signed)
 Daily Progress Note  DOA: 12/17/2023 Hospital Day: 3   Chief Complaint: worsening anemia, FOBT+   ASSESSMENT    89 y.o. year old male with multiple medical problems as listed below. Admitted with acute on chronic heart failure, worsening of chronic anemia   Chronic heart failure Possible acute diastolic heart failure Cardiology following. Diuresis on hold as DOE felt to be mainly due to acute on chronic anemia. Echo pending.   Acute on chronic macrocytic anemia Iron deficiency. Ferritin 21 FOBT+ on warfarin with supratherapeutic INR.  No overt GI bleeding but stools chronically dark on iron  Rule out occult GI bleeding from AVMs, a new development of portal hypertensive gastropathy. Hgb 6.8 on admission, down from 9.4 in late January Today.  Hgb 7.5 after 1 u RBCs yesterday. Getting 2nd unit   Cryptogenic cirrhosis.  Has been compensated without history of jaundice,  ascites,  hepatic encephalopathy, or variceal bleeding.   CAD s/p remote CABG Afib  / aflutter Mechanical AVR, on warfarin Chronic warfarin Warfarin on hold. INR 3.7 on admit >> 3.4 yesterday. INR not done today    Chronic GERD On daily PPI at home   CKD 3a   PLAN   --Follow up post-transfusion H/H. Getting 2nd unit now --Discussed with patient and Cardiology in room. Plan for EGD when INR improves to 2.5 or less ( letting it drift). Cardiology wants him back on warfarin as soon as possible.  -- Continue twice daily IV pantoprazole -- Check INR today ( not done) and in am. Keep NPO after MN just in case INR acceptable range and can do EGD tomorrow.   Subjective   No complaints. Cardiology rounding on him now. In good spirits.   Objective    Recent Labs    12/17/23 2128 12/18/23 0455 12/19/23 0237  WBC 6.0 5.9 6.7  HGB 6.8* 7.7* 7.5*  HCT 22.4* 25.2* 23.4*  MCV 112.0* 109.1* 104.9*  PLT 244 225 224   Recent Labs    12/18/23 0455 12/19/23 0237  FOLATE 13.0  --   VITAMINB12  --   603  FERRITIN  --  21*  TIBC  --  357  IRONPCTSAT  --  5*   Recent Labs    12/17/23 2128 12/18/23 0455 12/19/23 0237  NA 138 139 140  K 4.4 3.7 3.7  CL 112* 112* 108  CO2 21* 21* 22  GLUCOSE 133* 109* 103*  BUN 27* 24* 22  CREATININE 1.46* 1.48* 1.56*  CALCIUM 8.4* 8.2* 8.4*   Recent Labs    12/17/23 2128 12/18/23 0455  PROT 5.8* 5.9*  ALBUMIN 2.5* 2.5*  AST 63* 49*  ALT 18 20  ALKPHOS 84 74  BILITOT 0.6 1.1   Recent Labs    12/18/23 0455  INR 3.4*  Imaging:  DG Chest 2 View CLINICAL DATA:  Dyspnea and CHF.  EXAM: CHEST - 2 VIEW  COMPARISON:  Chest x-ray 10/03/2023  FINDINGS: The heart is enlarged. Patient is status post cardiac surgery and valve replacement. There is a small left pleural effusion. There is no focal lung infiltrate or pneumothorax. No acute fractures are seen.  IMPRESSION: 1. Cardiomegaly. 2. Small left pleural effusion.  Is  Electronically Signed   By: Darliss Cheney M.D.   On: 12/17/2023 22:13     Scheduled inpatient medications:   sodium chloride   Intravenous Once   sodium chloride   Intravenous Once   diltiazem  120 mg Oral Daily  levothyroxine  125 mcg Oral QAC breakfast   pantoprazole (PROTONIX) IV  40 mg Intravenous Q12H   Continuous inpatient infusions:  PRN inpatient medications:   Vital signs in last 24 hours: Temp:  [97.6 F (36.4 C)-98.9 F (37.2 C)] 98.2 F (36.8 C) (04/17 1119) Pulse Rate:  [76-100] 77 (04/17 1119) Resp:  [16-21] 21 (04/17 1119) BP: (96-134)/(21-80) 102/49 (04/17 1119) SpO2:  [92 %-98 %] 95 % (04/17 1119) Weight:  [78.3 kg-82.3 kg] 78.3 kg (04/17 0432) Last BM Date : 12/19/23  Intake/Output Summary (Last 24 hours) at 12/19/2023 1218 Last data filed at 12/19/2023 1100 Gross per 24 hour  Intake --  Output 1150 ml  Net -1150 ml    Intake/Output from previous day: 04/16 0701 - 04/17 0700 In: -  Out: 1050 [Urine:1050] Intake/Output this shift: Total I/O In: -  Out: 650  [Urine:650]   Physical Exam:  Not examined. Cardiology rounds in progress General: Alert male in NAD Psych: Pleasant. Cooperative     LOS: 2 days   Mai Schwalbe ,NP 12/19/2023, 12:18 PM

## 2023-12-19 NOTE — Progress Notes (Addendum)
 Progress Note   Patient: Jason Santana UJW:[id number] DOB: Jul 04, 1932 DOA: 12/17/2023     2 DOS: the patient was seen and examined on 12/19/2023   Brief hospital course: 89 y.o. male with medical history significant of CAD, A-fib/flutter and mechanical aortic valve replacement on Coumadin, PVCs, CKD stage IIIa, hypertension, cryptogenic cirrhosis, GERD, hypothyroidism presenting with complaints of shortness of breath and bilateral lower extremity edema.  Patient states his doctor had reduced the dose of his home Lasix from 40 mg daily to 20 mg daily a month ago.  Since then he is having progressively worsening dyspnea on exertion and bilateral lower extremity edema. Also with increasing melena outpatient basis.  Came to ED for the same.  Found to have Hgb 6.9 and given 1 unit PRBC  Assessment and Plan  Acute GI bleed Acute on chronic anemia secondary to blood loss Hemoglobin 6.8, previously 9.4 on 10/03/2023.  - GI consulted.  Appreciate input.  FOBT positive, INR 3.4.   - No overt GI bleed - s/p 2 units now, Hgb goal >8 with CAD - regular diet, observe for any bleeding.  Holding warfarin.  INR pending - possible EGD tomorrow, NPO after midnight   Acute on chronic HFpEF - DOE likely secondary to acute anemia - BNP 252 and CXR with cardiomegaly - not currently hypoxic/on RA - with some bump in troponin, likely strain - holding on further diuresis with bump in creatinine today  - monitor weights and I/Os   Elevated troponin History of CAD Troponin 255> 251.  Patient is not endorsing chest pain.   Likely demand ischemia, appreciate cards input    Paroxysmal A-fib/flutter History of mechanical aortic valve replacement Hold Coumadin at this time given concern for acute GI bleed and slightly supratherapeutic INR.  Since patient is hemodynamically stable and no overt bleeding, will hold off giving vitamin K at this time and monitor INR.  Continue diltiazem. - Check INR in AM   History of  PVCs Continue diltiazem.   CKD stage IIIa Creatinine stable, monitor labs.   Hypertension Blood pressure currently stable.  Continue diltiazem and IV Lasix.   Cryptogenic cirrhosis No signs of hepatic encephalopathy or ascites at this time.   Hypothyroidism Continue Synthroid.     Subjective: Patient awake and alert.  Feels much better from respiratory standpoint.  Denies any chest pain or overt GI bleeding.  Leg swelling much improved, good urine output   Physical Exam: Vitals:   12/19/23 0915 12/19/23 1006 12/19/23 1119 12/19/23 1231  BP: (!) 109/46 (!) 108/49 (!) 102/49 (!) (P) 114/45  Pulse: 81 76 77 76  Resp: 16 19 (!) 21 (!) 24  Temp: 97.8 F (36.6 C) 97.8 F (36.6 C) 98.2 F (36.8 C) (P) 98.3 F (36.8 C)  TempSrc: Oral Oral Oral (P) Oral  SpO2: 93% 94% 95% 95%  Weight:      Height:       Gen:  Alert, cooperative patient who appears stated age in no acute distress.  Vital signs reviewed. Cardiac:  Regular rate and rhythm without murmur auscultated. Mechanical click noted  Pulm:  Clear to auscultation bilaterally with good air movement.  No wheezes or rales noted.   Abd:  S/ND/NT Ext:  +1 edema BL LE's with hemosiderin staining.  Some TTP BL legs.  No redness or warmth Neuro:  awake and fully oriented.  Moving all limbs symmetrically.     Data Reviewed:   Family Communication: wife at bedside.   Disposition:  Status is: Inpatient  Planned Discharge Destination: Home    Time spent: 40 minutes  Author: Trenton Frock, MD 12/19/2023 3:16 PM  For on call review [email or web address].

## 2023-12-19 NOTE — Progress Notes (Signed)
 Daily Progress Note  DOA: 12/17/2023 Hospital Day: 3   Chief Complaint: worsening anemia, FOBT+   ASSESSMENT    89 y.o. year old male with multiple medical problems as listed below. Admitted with acute on chronic heart failure, worsening of chronic anemia   Chronic heart failure Possible acute diastolic heart failure Cardiology following. Diuresis on hold as DOE felt to be mainly due to acute on chronic anemia. Echo pending.   Acute on chronic macrocytic anemia Iron deficiency. Ferritin 21 FOBT+ on warfarin with supratherapeutic INR.  No overt GI bleeding but stools chronically dark on iron  Rule out occult GI bleeding from AVMs, a new development of portal hypertensive gastropathy. Hgb 6.8 on admission, down from 9.4 in late January Today.  Hgb 7.5 after 1 u RBCs yesterday. Getting 2nd unit   Cryptogenic cirrhosis.  Has been compensated without history of jaundice,  ascites,  hepatic encephalopathy, or variceal bleeding.   CAD s/p remote CABG Afib  / aflutter Mechanical AVR, on warfarin Chronic warfarin Warfarin on hold. INR 3.7 on admit >> 3.4 yesterday. INR not done today    Chronic GERD On daily PPI at home   CKD 3a   PLAN   --Follow up post-transfusion H/H. Getting 2nd unit now --Discussed with patient and Cardiology in room. Plan for EGD when INR improves to 2.5 or less ( letting it drift). Cardiology wants him back on warfarin as soon as possible.  -- Continue twice daily IV pantoprazole -- Check INR today ( not done) and in am. Keep NPO after MN just in case INR acceptable range and can do EGD tomorrow.   Subjective   No complaints. Cardiology rounding on him now. In good spirits.   Objective    Recent Labs    12/17/23 2128 12/18/23 0455 12/19/23 0237  WBC 6.0 5.9 6.7  HGB 6.8* 7.7* 7.5*  HCT 22.4* 25.2* 23.4*  MCV 112.0* 109.1* 104.9*  PLT 244 225 224   Recent Labs    12/18/23 0455 12/19/23 0237  FOLATE 13.0  --   VITAMINB12  --   603  FERRITIN  --  21*  TIBC  --  357  IRONPCTSAT  --  5*   Recent Labs    12/17/23 2128 12/18/23 0455 12/19/23 0237  NA 138 139 140  K 4.4 3.7 3.7  CL 112* 112* 108  CO2 21* 21* 22  GLUCOSE 133* 109* 103*  BUN 27* 24* 22  CREATININE 1.46* 1.48* 1.56*  CALCIUM 8.4* 8.2* 8.4*   Recent Labs    12/17/23 2128 12/18/23 0455  PROT 5.8* 5.9*  ALBUMIN 2.5* 2.5*  AST 63* 49*  ALT 18 20  ALKPHOS 84 74  BILITOT 0.6 1.1   Recent Labs    12/18/23 0455  INR 3.4*  Imaging:  DG Chest 2 View CLINICAL DATA:  Dyspnea and CHF.  EXAM: CHEST - 2 VIEW  COMPARISON:  Chest x-ray 10/03/2023  FINDINGS: The heart is enlarged. Patient is status post cardiac surgery and valve replacement. There is a small left pleural effusion. There is no focal lung infiltrate or pneumothorax. No acute fractures are seen.  IMPRESSION: 1. Cardiomegaly. 2. Small left pleural effusion.  Is  Electronically Signed   By: Darliss Cheney M.D.   On: 12/17/2023 22:13     Scheduled inpatient medications:   sodium chloride   Intravenous Once   sodium chloride   Intravenous Once   diltiazem  120 mg Oral Daily  levothyroxine  125 mcg Oral QAC breakfast   pantoprazole (PROTONIX) IV  40 mg Intravenous Q12H   Continuous inpatient infusions:  PRN inpatient medications:   Vital signs in last 24 hours: Temp:  [97.6 F (36.4 C)-98.9 F (37.2 C)] 98.2 F (36.8 C) (04/17 1119) Pulse Rate:  [76-100] 77 (04/17 1119) Resp:  [16-21] 21 (04/17 1119) BP: (96-134)/(21-80) 102/49 (04/17 1119) SpO2:  [92 %-98 %] 95 % (04/17 1119) Weight:  [78.3 kg-82.3 kg] 78.3 kg (04/17 0432) Last BM Date : 12/19/23  Intake/Output Summary (Last 24 hours) at 12/19/2023 1218 Last data filed at 12/19/2023 1100 Gross per 24 hour  Intake --  Output 1150 ml  Net -1150 ml    Intake/Output from previous day: 04/16 0701 - 04/17 0700 In: -  Out: 1050 [Urine:1050] Intake/Output this shift: Total I/O In: -  Out: 650  [Urine:650]   Physical Exam:  Not examined. Cardiology rounds in progress General: Alert male in NAD Psych: Pleasant. Cooperative     LOS: 2 days   Mai Schwalbe ,NP 12/19/2023, 12:18 PM

## 2023-12-19 NOTE — Progress Notes (Signed)
 Patient received 1 unit of PRBC with no complications

## 2023-12-19 NOTE — Plan of Care (Signed)
   Problem: Education: Goal: Knowledge of General Education information will improve Description: Including pain rating scale, medication(s)/side effects and non-pharmacologic comfort measures Outcome: Progressing   Problem: Health Behavior/Discharge Planning: Goal: Ability to manage health-related needs will improve Outcome: Progressing   Problem: Clinical Measurements: Goal: Will remain free from infection Outcome: Progressing   Problem: Activity: Goal: Risk for activity intolerance will decrease Outcome: Progressing   Problem: Nutrition: Goal: Adequate nutrition will be maintained Outcome: Progressing   Problem: Coping: Goal: Level of anxiety will decrease Outcome: Progressing   Problem: Elimination: Goal: Will not experience complications related to bowel motility Outcome: Progressing

## 2023-12-19 NOTE — Consult Note (Addendum)
 Cardiology Consultation   Patient ID: JJ ENYEART MRN: [id number]; DOB: [birth year]  Admit date: 12/17/2023 Date of Consult: 12/19/2023  PCP:  Sandre Kitty, MD   [facility] Providers Cardiologist:  Donato Schultz, MD  Electrophysiologist:  Will [name], MD  {    Patient Profile:   Jason Santana is a 89 y.o. male with a hx of CAD s/p CABG 2000, s/p mechanical aortic valve replacement 2000 on chronic Coumadin therapy, persistent A-fib/flutter s/p cardioversion 2019 and ablation 05/2019, hypertension, aortic aneurysm, hypothyroidism, BPH, recurrent GI bleed, CKD stage III AA, cryptogenic cirrhosis, GERD, who is being seen 12/19/2023 for the evaluation of heart failure and anticoagulation at the request of Dr. Gwendolyn Grant.  History of Present Illness:   Mr. Ducre with above past medical history presented to the ER on 12/17/23 for chief complaints of shortness of breath with exertion and generalized weakness.  He had reported not feeling well over the past 10 days, increasingly short of breath with walking, had chronic leg swelling bilaterally that stopped improving while taking Lasix.  He had reported that his Lasix was recently reduced from 40 mg to 20 mg daily.  He had reported that his stool is chronically dark while taking iron supplement.  He is compliant with Coumadin therapy for his mechanical aortic valve and atrial fibrillation/flutter.   Upon encounter, he states he had been feeling weak and SOB with any movement for the past 10 days. He has chronic leg edema. He felt he is much improved today. He states he has not been OOB. He states his leg swelling had been much improved. He denied ever having any chest pain. He denied any significant weight gain, orthopnea, PND prior to admission.   Diagnostic workup from 12/17/2023 revealing elevated creatinine 1.46, BUN 27, GFR 46, bicarb 21, albumin 2.5, AST 63.  BNP 252.  High sensitive troponin 255 >251.  CBC was hemoglobin 6.8  (hemoglobin was in the 9 range in January 2025).  INR 3.7.  FOBT positive.  Chest x-ray revealed cardiomegaly and small left pleural effusion.  EKG without acute ischemic change.  Patient is subsequently admitted to hospital medicine service, given IV Lasix for concern of CHF and 1 unit PRBC transfusion for anemia.  He is Coumadin has been held with concern of GI bleed. GI was consulted, reports that patient had EGD last on 08/2021 showing moderate nonspecific gastritis, H. pylori negative and no signs of portal hypertension.  No EGD is recommended at this time until further workup to evaluate for iron deficiency.  His cirrhosis was felt compensated at this time.  Cardiology is consulted today for further evaluation of acute heart failure.    Per chart review, patient follows Dr. Anne Fu (used to see Dr. Katrinka Blazing) and Dr. Elberta Fortis outpatient.  He has multivessel CAD underwent remote CABG in 2000 with mechanical aortic valve replacement.  He is chronically managed on anticoagulation with Coumadin.  He had been doing well since his CABG over the years.  Most recent cardiac catheterization was completed 08/14/2018 for concern of unstable angina revealing severe two-vessel CAD with total occlusion of OM 2 and mid RCA, nonobstructive LAD stenosis 50%, patent grafts for LIMA to LAD, SVG to OM 2, SVG to PDA.  He is on valsartan for medical therapy, not on antiplatelet due to chronic Coumadin use, not on statin historically.   Due to persistent A-fib and typical atrial flutter, he required cardioversion in 2019 and ultimately underwent ablation on 05/22/2019 by  Dr. Lawana Pray.  He was on amiodarone and this has been stopped since 2020 after ablation due to transaminitis and TSH abnormality.    Most recent echocardiogram completed on 10/15/2023 revealing LVEF 60 to 65%, no regional motion abnormality, mild LVH, indeterminate diastolic parameter, low normal RV, moderate elevated PASP with RVSP 53.7 mmHg, mild to moderate LAE,  severe MAC, moderate TR, normal structure and function of aortic valve prosthesis with mean gradient 10.4 mmHg, V-max 2.16 m/s.  Moderate dilatation of aortic root 38 mm.  He was seen by A-fib clinic 10/16/2023, denied any symptoms suggestive of A-fib.  His EKG from 10/14/2023 revealing A-fib and repeat EKG in the office revealing regular rhythm.  He was placed on 2-week ZIO monitor for further evaluation.  He was continued diltiazem 120 mg twice daily and Coumadin for anticoagulation.  Zio monitor from 10/16/2023 revealed sinus rhythm predominantly,  no A-fib or sustained arrhythmia were detected, and SVT ectopy at 7.3%.  He was recommended to follow-up in 6 months.   He was last seen by Dr. Renna Cary 10/24/2023 in office, complaining bilateral legs and knees swelling despite taking Lasix, compression stocking, and elevation.  It was felt his peripheral edema is more pronounced in the left leg and multifactorial due to venous insufficiency following CABG, RV dysfunction, possible fluid overload from CHF.  He had a negative DVT ultrasound.  He was recommended using Ace bandage for additional compression measure.  He was recommended continue Lasix 40 mg daily instead of 20 mg daily.  He was also referred to vascular surgery for further evaluation.  Past Medical History:  Diagnosis Date   Anticoagulation adequate 05/23/2019   Atrial fibrillation (HCC)    CAD (coronary artery disease) of artery bypass graft 2000   2000 with multiple bypasses    Chronic anticoagulation    Coumadin therapy for mechanical aortic valve. Postoperative atrial fibrillation.    Essential hypertension    H/O mechanical aortic valve replacement 2000   St Jude AVR   Hypothyroidism    Premature ventricular contractions    S/P ablation of atrial fibrillation 05/22/19 05/23/2019   Typical atrial flutter (HCC) 05/23/2019    Past Surgical History:  Procedure Laterality Date   AORTIC VALVE REPLACEMENT (AVR)/CORONARY ARTERY BYPASS GRAFTING  (CABG)  2000   ATRIAL FIBRILLATION ABLATION N/A 05/22/2019   Procedure: ATRIAL FIBRILLATION ABLATION;  Surgeon: Lei Pump, MD;  Location: [facility] INVASIVE CV LAB;  Service: Cardiovascular;  Laterality: N/A;   BIOPSY  08/24/2021   Procedure: BIOPSY;  Surgeon: Janel Medford, MD;  Location: [facility];  Service: Endoscopy;;   CARDIOVERSION N/A 02/24/2018   Procedure: CARDIOVERSION;  Surgeon: Elmyra Haggard, MD;  Location: [facility] ENDOSCOPY;  Service: Cardiovascular;  Laterality: N/A;   CARDIOVERSION N/A 08/15/2018   Procedure: CARDIOVERSION;  Surgeon: Lake Pilgrim, MD;  Location: [facility] ENDOSCOPY;  Service: Cardiovascular;  Laterality: N/A;   COLONOSCOPY WITH PROPOFOL N/A 01/08/2018   Procedure: COLONOSCOPY WITH PROPOFOL;  Surgeon: Evangeline Hilts, MD;  Location: [facility] ENDOSCOPY;  Service: Gastroenterology;  Laterality: N/A;   CORONARY/GRAFT ANGIOGRAPHY N/A 08/14/2018   Procedure: CORONARY/GRAFT ANGIOGRAPHY;  Surgeon: Arnoldo Lapping, MD;  Location: [facility] INVASIVE CV LAB;  Service: Cardiovascular;  Laterality: N/A;   ESOPHAGOGASTRODUODENOSCOPY (EGD) WITH PROPOFOL N/A 01/06/2018   Procedure: ESOPHAGOGASTRODUODENOSCOPY (EGD) WITH PROPOFOL;  Surgeon: Felecia Hopper, MD;  Location: [facility] ENDOSCOPY;  Service: Gastroenterology;  Laterality: N/A;   ESOPHAGOGASTRODUODENOSCOPY (EGD) WITH PROPOFOL N/A 08/24/2021   Procedure: ESOPHAGOGASTRODUODENOSCOPY (EGD) WITH PROPOFOL;  Surgeon: Janel Medford, MD;  Location: [facility] ENDOSCOPY;  Service: Endoscopy;  Laterality: N/A;   FOOT SURGERY Right    x 2     Home Medications:  Prior to Admission medications   Medication Sig Start Date End Date Taking? Authorizing Provider  Cholecalciferol (VITAMIN D) 2000 units tablet Take 2,000 Units by mouth daily.   Yes [provider]  diltiazem (TIADYLT ER) 120 MG 24 hr capsule Take 1 capsule (120 mg total) by mouth daily. 12/09/23  Yes Camnitz, Will Daphine Deutscher, MD  diphenhydramine-acetaminophen (TYLENOL PM) 25-500 MG TABS  tablet Take 0.5 tablets by mouth at bedtime.   Yes [provider]  ferrous sulfate 325 (65 FE) MG tablet Take 325 mg by mouth daily with supper.   Yes [provider]  furosemide (LASIX) 20 MG tablet Take 20 mg by mouth daily. 11/14/23  Yes [provider]  levothyroxine (SYNTHROID) 125 MCG tablet Take 125 mcg by mouth daily before breakfast.  11/21/18  Yes [provider]  pantoprazole (PROTONIX) 40 MG tablet Take 40 mg by mouth every other day.   Yes [provider]  Polyethyl Glycol-Propyl Glycol (SYSTANE OP) Place 1 drop into both eyes daily as needed (dry eyes).   Yes [provider]  tobramycin (TOBREX) 0.3 % ophthalmic solution Place 1 drop into the left eye See admin instructions. Instill 1 drop into left eye 1 day before, the day of, and the day after eye injections administered at Dr's office   Yes [provider]  valsartan (DIOVAN) 320 MG tablet TAKE 1 TABLET BY MOUTH EVERY DAY 01/24/23  Yes Camnitz, Will Daphine Deutscher, MD  warfarin (COUMADIN) 5 MG tablet Take by mouth See admin instructions. Take one (5mg ) tablet on Monday and Friday.  Take one and one-half (7.5mg ) tablets on Sun, Tues, Weds, Thurs, and Sat in the evening with supper.   Yes [provider]  furosemide (LASIX) 40 MG tablet Take 1 tablet (40 mg total) by mouth daily. Patient not taking: Reported on 12/18/2023 10/28/23   Jake Bathe, MD    Inpatient Medications: Scheduled Meds:  sodium chloride   Intravenous Once   sodium chloride   Intravenous Once   diltiazem  120 mg Oral Daily   furosemide  40 mg Intravenous BID   levothyroxine  125 mcg Oral QAC breakfast   pantoprazole (PROTONIX) IV  40 mg Intravenous Q12H   Continuous Infusions:  PRN Meds:   Allergies:    Allergies  Allergen Reactions   Carvedilol Shortness Of Breath    wheezing   Lopid [Gemfibrozil] Other (See Comments)    transaminitis   Monopril [Fosinopril] Other (See Comments)     transaminitis   Vicodin [Hydrocodone-Acetaminophen]     Severe sensitivity   Phenergan [Promethazine Hcl] Other (See Comments)    Severe somnolence.   Zocor [Simvastatin]     Short memory loss.   Benicar [Olmesartan]     Abdominal cramping and increased stools/ irritable bowel   Codeine Nausea And Vomiting   Prilosec [Omeprazole]     dyspepsia    Social History:   Social History   Socioeconomic History   Marital status: Married    Spouse name: Not on file   Number of children: Not on file   Years of education: Not on file   Highest education level: Not on file  Occupational History   Not on file  Tobacco Use   Smoking status: Former    Types: Cigarettes   Smokeless tobacco: Never   Tobacco comments:  Former smoker (quit 66 years ago) 05/21/23  Vaping Use   Vaping status: Never Used  Substance and Sexual Activity   Alcohol use: No   Drug use: No   Sexual activity: Not on file  Other Topics Concern   Not on file  Social History Narrative   Not on file   Social Drivers of Health   Financial Resource Strain: Not on file  Food Insecurity: No Food Insecurity (12/18/2023)   Hunger Vital Sign    Worried About Running Out of Food in the Last Year: Never true    Ran Out of Food in the Last Year: Never true  Transportation Needs: No Transportation Needs (12/18/2023)   PRAPARE - Administrator, Civil Service (Medical): No    Lack of Transportation (Non-Medical): No  Physical Activity: Not on file  Stress: Not on file  Social Connections: Socially Integrated (12/18/2023)   Social Connection and Isolation Panel [NHANES]    Frequency of Communication with Friends and Family: More than three times a week    Frequency of Social Gatherings with Friends and Family: More than three times a week    Attends Religious Services: More than 4 times per year    Active Member of [employer] or Organizations: Yes    Attends Engineer, structural: More than 4 times per year     Marital Status: Married  Catering manager Violence: Not At Risk (12/18/2023)   Humiliation, Afraid, Rape, and Kick questionnaire    Fear of Current or Ex-Partner: No    Emotionally Abused: No    Physically Abused: No    Sexually Abused: No    Family History:    Family History  Problem Relation Age of Onset   Hypertension Mother    Heart attack Mother    Hypertension Father    Colon cancer Neg Hx    Esophageal cancer Neg Hx    Pancreatic cancer Neg Hx    Stomach cancer Neg Hx      ROS:  Constitutional: Denied fever, chills, malaise, night sweats Eyes: Denied vision change or loss Ears/Nose/Mouth/Throat: Denied ear ache, sore throat, coughing, sinus pain Cardiovascular:see HPI  Respiratory: see HPI  Gastrointestinal: Denied nausea, vomiting, abdominal pain, diarrhea Genital/Urinary: Denied dysuria, hematuria, urinary frequency/urgency Musculoskeletal: weakness  Skin: Denied rash, wound Neuro: Denied headache, dizziness, syncope Psych: Denied history of depression/anxiety  Endocrine: Denied history of diabetes   Physical Exam/Data:   Vitals:   12/19/23 0722 12/19/23 0902 12/19/23 0915 12/19/23 1006  BP: 114/61 114/61 (!) 109/46 (!) 108/49  Pulse: 86  81 76  Resp: 17  16 19   Temp: 98.9 F (37.2 C)  97.8 F (36.6 C) 97.8 F (36.6 C)  TempSrc: Axillary  Oral Oral  SpO2:   93% 94%  Weight:      Height:        Intake/Output Summary (Last 24 hours) at 12/19/2023 1024 Last data filed at 12/19/2023 0432 Gross per 24 hour  Intake --  Output 1050 ml  Net -1050 ml      12/19/2023    4:32 AM 12/18/2023    3:57 PM 12/17/2023    9:04 PM  Last 3 Weights  Weight (lbs) 172 lb 9.6 oz 181 lb 7 oz 180 lb  Weight (kg) 78.291 kg 82.3 kg 81.647 kg     Body mass index is 24.77 kg/m.   Vitals:  Vitals:   12/19/23 1006 12/19/23 1119  BP: (!) 108/49 (!) 102/49  Pulse: 76  77  Resp: 19 (!) 21  Temp: 97.8 F (36.6 C) 98.2 F (36.8 C)  SpO2: 94% 95%   General  Appearance: In no apparent distress, laying in bed, well nourished, eating  HEENT: Normocephalic, atraumatic.  Neck: Supple, trachea midline, no JVDs Cardiovascular: Regular rate and rhythm, normal S1-S2,  mechanical click  Respiratory: Resting breathing unlabored, lungs sounds clear to auscultation bilaterally, no use of accessory muscles. On room air.   Gastrointestinal: Bowel sounds positive, abdomen soft Extremities: Able to move all extremities in bed without difficulty, chronic BLE venous stasis change, tender to touch, bilateral erythema +warmth, trace edema  Musculoskeletal: Normal muscle bulk and tone Skin: Intact, warm, dry. Neurologic: Alert, oriented to person, place and time. Fluent speech, no facial droop, no cognitive deficit Psychiatric: Normal affect. Mood is appropriate.    EKG:  The EKG was personally reviewed and demonstrates:    EKG from 12/17/2023 at 2113 reveals sinus rhythm at 82 bpm, TWI of lateral leads, poor R wave progression.  Repeat EKG on 12/17/2023 without significant change.  Lateral T wave abnormality seems similar to previous EKG in January 2025.  Telemetry:  Telemetry was personally reviewed and demonstrates:    Sinus rhythm, PVCs, there is brief periods of difficult seen P wave rhythm likely artifacts rather than A fib, defer further review to MD    Relevant CV Studies:  Event monitor 10/16/2023:  HR 55 - 160, average 75 bpm. 138 SVT episodes, longest 15.4 seconds at 106 BPM No atrial fibrillation detected. Frequent supraventricular ectopy, 7.3%. Rare ventricular ectopy. No sustained arrhythmias. No symptom trigger episodes.  Echo from 10/15/2023:  1. Left ventricular ejection fraction, by estimation, is 60 to 65%. The  left ventricle has normal function. The left ventricle has no regional  wall motion abnormalities. There is mild left ventricular hypertrophy.  Left ventricular diastolic parameters  are indeterminate.   2. Right ventricular  systolic function is low normal. The right  ventricular size is normal. There is moderately elevated pulmonary artery  systolic pressure. The estimated right ventricular systolic pressure is  53.7 mmHg.   3. Left atrial size was mild to moderately dilated.   4. The mitral valve is degenerative. No evidence of mitral valve  regurgitation. Severe mitral annular calcification.   5. Tricuspid valve regurgitation is moderate.   6. Aortic valve regurgitation is not visualized. There is a St. Jude  bioprosthetic valve present in the aortic position. Procedure Date: 2000.  Echo findings are consistent with normal structure and function of the  aortic valve prosthesis. Aortic valve  mean gradient measures 10.4 mmHg. Aortic valve Vmax measures 2.16 m/s.   7. There is moderate dilatation of the aortic root, measuring 38 mm.   8. The inferior vena cava is normal in size with <50% respiratory  variability, suggesting right atrial pressure of 8 mmHg.    Coronary angiography 08/14/2018:  Mid RCA lesion is 100% stenosed. Ost 2nd Mrg lesion is 100% stenosed. Prox LAD to Mid LAD lesion is 50% stenosed. SVG. The graft exhibits mild .   1.  Severe two-vessel coronary artery disease with total occlusion of the OM 2 and the mid RCA, nonobstructive LAD stenosis 2.  Status post remote aortocoronary bypass surgery with continued patency of the LIMA to LAD, saphenous vein graft OM 2, and saphenous vein graft to PDA   Will resume heparin and warfarin tonight with plans noted for cardioversion.  Management per primary cardiology team.   Laboratory Data:  High Sensitivity  Troponin:   Recent Labs  Lab 12/17/23 2128 12/17/23 2333  TROPONINIHS 255* 251*     Chemistry Recent Labs  Lab 12/17/23 2128 12/18/23 0455 12/19/23 0237  NA 138 139 140  K 4.4 3.7 3.7  CL 112* 112* 108  CO2 21* 21* 22  GLUCOSE 133* 109* 103*  BUN 27* 24* 22  CREATININE 1.46* 1.48* 1.56*  CALCIUM 8.4* 8.2* 8.4*  GFRNONAA  46* 45* 42*  ANIONGAP 5 6 10     Recent Labs  Lab 12/17/23 2128 12/18/23 0455  PROT 5.8* 5.9*  ALBUMIN 2.5* 2.5*  AST 63* 49*  ALT 18 20  ALKPHOS 84 74  BILITOT 0.6 1.1   Lipids No results for input(s): "CHOL", "TRIG", "HDL", "LABVLDL", "LDLCALC", "CHOLHDL" in the last 168 hours.  Hematology Recent Labs  Lab 12/17/23 2128 12/18/23 0455 12/19/23 0237  WBC 6.0 5.9 6.7  RBC 2.00* 2.31* 2.23*  HGB 6.8* 7.7* 7.5*  HCT 22.4* 25.2* 23.4*  MCV 112.0* 109.1* 104.9*  MCH 34.0 33.3 33.6  MCHC 30.4 30.6 32.1  RDW 15.9* 16.6* 15.9*  PLT 244 225 224   Thyroid No results for input(s): "TSH", "FREET4" in the last 168 hours.  BNP Recent Labs  Lab 12/17/23 2128  BNP 252.6*    DDimer No results for input(s): "DDIMER" in the last 168 hours.   Radiology/Studies:  DG Chest 2 View Result Date: 12/17/2023 CLINICAL DATA:  Dyspnea and CHF. EXAM: CHEST - 2 VIEW COMPARISON:  Chest x-ray 10/03/2023 FINDINGS: The heart is enlarged. Patient is status post cardiac surgery and valve replacement. There is a small left pleural effusion. There is no focal lung infiltrate or pneumothorax. No acute fractures are seen. IMPRESSION: 1. Cardiomegaly. 2. Small left pleural effusion.  Is Electronically Signed   By: Darliss Cheney M.D.   On: 12/17/2023 22:13     Assessment and Plan:   Dyspnea on exertion Generalized weakness Chronic bilateral lower extremity edema Questionable acute diastolic heart failure - presented with increased DOE and generalized weakness over the past 10 days, has chronic lower extremity edema that was felt due to venous insufficiency+ RV dysfunction,+/-volume overload last office visit, hypoalbuminemia likely play a role as well - BNP 252, POA - Chest x-ray without acute finding, POA - Echo from 10/15/2023 with LVEF 60 to 65%, no regional motion abnormality, mild LVH, diastolic indeterminate, low normal RV systolic function, moderate pulmonary hypertension with RVSP 53.7 mmHg, mild  to moderate LAE, severe MAC, moderate TR, normal structure and function of aortic valve prosthesis with mean gradient 10.4 mmHg and V-max 2.16 m/s  - weight 181.44 ib at admission, weight was 184 ib during office visit on 10/28/2023, clinically euvolemic today, s/p IV Lasix 40mg  BID by primary team, Cr rising today, given improved edema and lacking respiratory symptoms,  will hold off further diuresis; suspect his DOE is primarily due to acute on chronic anemia given hemoglobin dropped to 6.8 from 9-11 range - will repeat limited Echo today   Acute blood loss anemia - Hemoglobin 6.8 POA, dropped from baseline 9 range improved after blood transfusion to 7 range  - FOBT positive - On chronic Coumadin with INR 3.7 on 12/17/2023 - Certainly slight supratherapeutic INR can be contributing, will need definite GI workup to determine if continued Coumadin therapy can be safely used, defer to GI/primary team  Mechanical aortic valve replacement 2000 - Will repeat limited echo - Need to resume Coumadin therapy as soon as cleared by GI INR goal 2.5-3.5 -  INR 3.4 yesterday, Coumadin has been held by primary team, need to resume as soon as possible   Elevated troponin History of multivessel CAD status post CABG 2000 - Most recent cardiac cath 2019 with patent grafts - High sensitive troponin 255 >251, denied any chest pain, dizziness, syncope - Suspect demand ischemia in the setting of acute anemia - Will repeat limited echo  - No plan for further ischemic evaluation at this time  Persistent atrial fibrillation/flutter - History of A-fib/flutter ablation 2020 - Recent ZIO monitor from 10/2023 without recurrence of A-fib - current telemetry showed sinus rhythm, periods of artifacts and PVCs unlikely A fib, defer further review per MD - Would resume anticoagulation with Coumadin as soon as cleared by GI - Continue PTA diltiazem CD 120mg  for rate control, may hold if become hypotensive    Risk  Assessment/Risk Scores:  {  CHA2DS2-VASc Score = 5   This indicates a 7.2% annual risk of stroke. The patient's score is based upon: CHF History: 1 HTN History: 1 Diabetes History: 0 Stroke History: 0 Vascular Disease History: 1 Age Score: 2 Gender Score: 0   For questions or updates, please contact  [facility] Please consult [email or web address] for contact info under   Signed, Cyndi Bender, NP  12/19/2023 10:24 AM  History and all data above reviewed.  Patient examined.  I agree with the findings as above.  He presented the emergency room with shortness of breath and weakness.  This has been going on for approximately 10 days.  He usually is out in his garage and very busy and now has noticed that he has just been fatigued.  He is had shortness of breath with moderate activities such as walking up the stairs.  He can make it to the top that he has been more dyspneic.  He is not describing PND or orthopnea.  He has not noticed any new palpitations, presyncope or syncope.  He has had some increased lower extremity swelling.  He has noted to have acute on chronic anemia.  He had some black stools but he does take iron.  He does have mildly elevated BNP.  Troponin has been elevated.  Chest x-ray shows some mild pleural effusions.  He is on Coumadin with a history of mechanical AVR.  He has paroxysmal atrial fibrillation.  His INR was 3.5.  Cardiology is consulted today for further evaluation of acute heart failure.  The patient exam reveals COR: Regular rate and rhythm, mechanical S2,  Lungs: Clear to auscultation bilaterally,  Abd: Positive bowel sounds normal in frequency and pitch, no rebound, no guarding, Ext trace lower extremity edema.  All available labs, radiology testing, previous records reviewed. Agree with documented assessment and plan.   Acute on chronic diastolic HF: He did get IV Lasix but his creatinine has bumped a little bit.  We will repeat a limited echocardiogram.  At this  point we will hold off on further diuresis.  Certainly his anemia is compounding his symptoms and management of this will probably offer significant symptomatic improvement.  He does have evidence of elevated pulmonary pressures that are moderately elevated.  He probably has some preload dependence.  I will avoid overdiuresis.  Elevated troponin:  He has no symptoms consistent with acute coronary syndrome.    I am not planning inpatient workup.  Atrial fibrillation: He had no significant burden of this on a recent monitor.  His chronic anticoagulation because of his AVR.  Certainly we need to hold this  until GI has evaluated him.  Will get a scope when his INR is less than 2.  We can try to keep the INR going forward closer to the 2.5 range.  AVR: As above.  I do not suspect that any bridging will be necessary.  Elijiah Florentina Marquart  12:28 PM  12/19/2023

## 2023-12-19 NOTE — Care Management Important Message (Signed)
 Important Message  Patient Details  Name: Jason Santana MRN: [id number] Date of Birth: 12-04-1931   Important Message Given:  Yes - Medicare IM     Wynonia Hedges 12/19/2023, 4:01 PM

## 2023-12-20 ENCOUNTER — Encounter (HOSPITAL_COMMUNITY)
Admission: EM | Disposition: A | Discharge status: Hospice | Payer: Self-pay | Source: Home / Self Care | Attending: Internal Medicine

## 2023-12-20 ENCOUNTER — Inpatient Hospital Stay (HOSPITAL_COMMUNITY)

## 2023-12-20 ENCOUNTER — Encounter (HOSPITAL_COMMUNITY): Payer: Self-pay | Admitting: Internal Medicine

## 2023-12-20 DIAGNOSIS — K31811 Angiodysplasia of stomach and duodenum with bleeding: Secondary | ICD-10-CM | POA: Diagnosis not present

## 2023-12-20 DIAGNOSIS — D509 Iron deficiency anemia, unspecified: Secondary | ICD-10-CM

## 2023-12-20 DIAGNOSIS — K922 Gastrointestinal hemorrhage, unspecified: Secondary | ICD-10-CM | POA: Diagnosis not present

## 2023-12-20 DIAGNOSIS — I359 Nonrheumatic aortic valve disorder, unspecified: Secondary | ICD-10-CM | POA: Diagnosis not present

## 2023-12-20 DIAGNOSIS — D500 Iron deficiency anemia secondary to blood loss (chronic): Secondary | ICD-10-CM | POA: Diagnosis not present

## 2023-12-20 DIAGNOSIS — R7989 Other specified abnormal findings of blood chemistry: Secondary | ICD-10-CM | POA: Diagnosis not present

## 2023-12-20 DIAGNOSIS — I5033 Acute on chronic diastolic (congestive) heart failure: Secondary | ICD-10-CM | POA: Diagnosis not present

## 2023-12-20 HISTORY — PX: ESOPHAGOGASTRODUODENOSCOPY: SHX5428

## 2023-12-20 HISTORY — PX: HOT HEMOSTASIS: SHX5433

## 2023-12-20 LAB — URINALYSIS, ROUTINE W REFLEX MICROSCOPIC
Bacteria, UA: NONE SEEN
Bilirubin Urine: NEGATIVE
Glucose, UA: NEGATIVE mg/dL
Hgb urine dipstick: NEGATIVE
Ketones, ur: NEGATIVE mg/dL
Nitrite: NEGATIVE
Protein, ur: NEGATIVE mg/dL
Specific Gravity, Urine: 1.005 (ref 1.005–1.030)
pH: 7 (ref 5.0–8.0)

## 2023-12-20 LAB — BASIC METABOLIC PANEL WITH GFR
Anion gap: 10 (ref 5–15)
BUN: 23 mg/dL (ref 8–23)
CO2: 20 mmol/L — ABNORMAL LOW (ref 22–32)
Calcium: 8.3 mg/dL — ABNORMAL LOW (ref 8.9–10.3)
Chloride: 108 mmol/L (ref 98–111)
Creatinine, Ser: 1.53 mg/dL — ABNORMAL HIGH (ref 0.61–1.24)
GFR, Estimated: 43 mL/min — ABNORMAL LOW (ref >60)
Glucose, Bld: 107 mg/dL — ABNORMAL HIGH (ref 70–99)
Potassium: 3.8 mmol/L (ref 3.5–5.1)
Sodium: 138 mmol/L (ref 135–145)

## 2023-12-20 LAB — TYPE AND SCREEN
ABO/RH(D): A POS
Antibody Screen: NEGATIVE
Unit division: 0
Unit division: 0

## 2023-12-20 LAB — CBC
HCT: 28.2 % — ABNORMAL LOW (ref 39.0–52.0)
Hemoglobin: 8.9 g/dL — ABNORMAL LOW (ref 13.0–17.0)
MCH: 32.6 pg (ref 26.0–34.0)
MCHC: 31.6 g/dL (ref 30.0–36.0)
MCV: 103.3 fL — ABNORMAL HIGH (ref 80.0–100.0)
Platelets: 192 10*3/uL (ref 150–400)
RBC: 2.73 MIL/uL — ABNORMAL LOW (ref 4.22–5.81)
RDW: 17.5 % — ABNORMAL HIGH (ref 11.5–15.5)
WBC: 6.1 10*3/uL (ref 4.0–10.5)
nRBC: 0 % (ref 0.0–0.2)

## 2023-12-20 LAB — PROTIME-INR
INR: 2.5 — ABNORMAL HIGH (ref 0.8–1.2)
Prothrombin Time: 27.6 s — ABNORMAL HIGH (ref 11.4–15.2)

## 2023-12-20 LAB — BPAM RBC
Blood Product Expiration Date: 202505112359
Blood Product Expiration Date: 202505132359
ISSUE DATE / TIME: 202504152355
ISSUE DATE / TIME: 202504170940
Unit Type and Rh: 6200
Unit Type and Rh: 6200

## 2023-12-20 SURGERY — EGD (ESOPHAGOGASTRODUODENOSCOPY)
Anesthesia: Monitor Anesthesia Care

## 2023-12-20 MED ORDER — AZITHROMYCIN 500 MG IV SOLR
500.0000 mg | INTRAVENOUS | Status: DC
  Administered 2023-12-20 – 2023-12-21 (×2): 500 mg via INTRAVENOUS
  Filled 2023-12-20 (×2): qty 5

## 2023-12-20 MED ORDER — PROPOFOL 500 MG/50ML IV EMUL
INTRAVENOUS | Status: DC | PRN
  Administered 2023-12-20: 100 ug/kg/min via INTRAVENOUS

## 2023-12-20 MED ORDER — SODIUM CHLORIDE 0.9% FLUSH: 3.0000 mL | INTRAVENOUS | Status: DC | PRN

## 2023-12-20 MED ORDER — DM-GUAIFENESIN ER 30-600 MG PO TB12
2.0000 | ORAL_TABLET | Freq: Two times a day (BID) | ORAL | Status: DC
  Administered 2023-12-20 – 2023-12-28 (×16): 2 via ORAL
  Filled 2023-12-20: qty 1
  Filled 2023-12-20 (×2): qty 2
  Filled 2023-12-20 (×2): qty 1
  Filled 2023-12-20 (×12): qty 2

## 2023-12-20 MED ORDER — SODIUM CHLORIDE 0.9 % IV SOLN
2.0000 g | INTRAVENOUS | Status: DC
  Administered 2023-12-20 – 2023-12-21 (×2): 2 g via INTRAVENOUS
  Filled 2023-12-20 (×2): qty 20

## 2023-12-20 MED ORDER — ONDANSETRON HCL 4 MG/2ML IJ SOLN
4.0000 mg | Freq: Four times a day (QID) | INTRAMUSCULAR | Status: DC | PRN
  Administered 2023-12-20 – 2023-12-25 (×3): 4 mg via INTRAVENOUS
  Filled 2023-12-20 (×3): qty 2

## 2023-12-20 MED ORDER — SODIUM CHLORIDE 0.9% FLUSH: 3.0000 mL | Freq: Two times a day (BID) | INTRAVENOUS | Status: DC

## 2023-12-20 MED ORDER — GUAIFENESIN-DM 100-10 MG/5ML PO SYRP: 10.0000 mL | ORAL_SOLUTION | ORAL | Status: DC | PRN

## 2023-12-20 MED ORDER — LIDOCAINE 2% (20 MG/ML) 5 ML SYRINGE
INTRAMUSCULAR | Status: DC | PRN
  Administered 2023-12-20: 60 mg via INTRAVENOUS

## 2023-12-20 MED ORDER — ACETAMINOPHEN 325 MG PO TABS
650.0000 mg | ORAL_TABLET | Freq: Once | ORAL | Status: AC
  Administered 2023-12-20: 650 mg via ORAL
  Filled 2023-12-20: qty 2

## 2023-12-20 MED ORDER — FUROSEMIDE 10 MG/ML IJ SOLN
40.0000 mg | Freq: Once | INTRAMUSCULAR | Status: AC
  Administered 2023-12-20: 40 mg via INTRAVENOUS
  Filled 2023-12-20: qty 4

## 2023-12-20 MED ORDER — PROPOFOL 10 MG/ML IV BOLUS
INTRAVENOUS | Status: DC | PRN
  Administered 2023-12-20: 50 mg via INTRAVENOUS

## 2023-12-20 MED ORDER — SODIUM CHLORIDE 0.9 % IV SOLN
INTRAVENOUS | Status: DC | PRN
Start: 2023-12-20 — End: 2023-12-20

## 2023-12-20 MED ORDER — ACETAMINOPHEN 325 MG PO TABS
650.0000 mg | ORAL_TABLET | Freq: Four times a day (QID) | ORAL | Status: DC | PRN
  Administered 2023-12-20 – 2023-12-24 (×7): 650 mg via ORAL
  Filled 2023-12-20 (×7): qty 2

## 2023-12-20 NOTE — Anesthesia Preprocedure Evaluation (Addendum)
 Anesthesia Evaluation  Patient identified by MRN, date of birth, ID band Patient awake    Reviewed: Allergy & Precautions, NPO status , Patient's Chart, lab work & pertinent test results  Airway Mallampati: II  TM Distance: >3 FB Neck ROM: Full    Dental no notable dental hx. (+) Upper Dentures, Lower Dentures, Missing   Pulmonary former smoker   Pulmonary exam normal        Cardiovascular hypertension, Pt. on medications + CAD and +CHF  + dysrhythmias Atrial Fibrillation + Valvular Problems/Murmurs  Rhythm:Irregular Rate:Normal     Neuro/Psych negative neurological ROS  negative psych ROS   GI/Hepatic Neg liver ROS,GERD  Medicated,,GIB   Endo/Other  Hypothyroidism    Renal/GU   negative genitourinary   Musculoskeletal negative musculoskeletal ROS (+)    Abdominal Normal abdominal exam  (+)   Peds  Hematology  (+) Blood dyscrasia, anemia Lab Results      Component                Value               Date                      WBC                      6.1                 12/20/2023                HGB                      8.9 (L)             12/20/2023                HCT                      28.2 (L)            12/20/2023                MCV                      103.3 (H)           12/20/2023                PLT                      192                 12/20/2023              Anesthesia Other Findings   Reproductive/Obstetrics                             Anesthesia Physical Anesthesia Plan  ASA: 3  Anesthesia Plan: MAC   Post-op Pain Management:    Induction: Intravenous  PONV Risk Score and Plan: 1 and Propofol  infusion and Treatment may vary due to age or medical condition  Airway Management Planned: Simple Face Mask and Nasal Cannula  Additional Equipment: None  Intra-op Plan:   Post-operative Plan:   Informed Consent: I have reviewed the patients History and Physical,  chart, labs and discussed the procedure including the risks, benefits and alternatives for the proposed anesthesia  with the patient or authorized representative who has indicated his/her understanding and acceptance.     Dental advisory given  Plan Discussed with: CRNA  Anesthesia Plan Comments:        Anesthesia Quick Evaluation

## 2023-12-20 NOTE — Plan of Care (Signed)

## 2023-12-20 NOTE — Progress Notes (Signed)
 Progress Note  Patient Name: Jason Santana Date of Encounter: 12/20/2023  Primary Cardiologist:   Oneil Parchment, MD   Subjective   Post EGD, feels nauseated.  Cough SOB    I just don't feel good.   Inpatient Medications    Scheduled Meds:  diltiazem   120 mg Oral Daily   levothyroxine   125 mcg Oral QAC breakfast   pantoprazole  (PROTONIX ) IV  40 mg Intravenous Q12H   Continuous Infusions:  PRN Meds: guaiFENesin -dextromethorphan , loratadine    Vital Signs    Vitals:   12/20/23 0020 12/20/23 0448 12/20/23 0643 12/20/23 0736  BP: (!) 105/52 122/77    Pulse: 78 100    Resp: 17 20    Temp: 99.5 F (37.5 C) (!) 101.3 F (38.5 C) (!) 100.5 F (38.1 C) 98.4 F (36.9 C)  TempSrc: Oral Oral Oral Oral  SpO2: 92% 91%    Weight:      Height:        Intake/Output Summary (Last 24 hours) at 12/20/2023 0745 Last data filed at 12/20/2023 0300 Gross per 24 hour  Intake 760 ml  Output 1450 ml  Net -690 ml   Filed Weights   12/17/23 2104 12/18/23 1557 12/19/23 0432  Weight: 81.6 kg 82.3 kg 78.3 kg    Telemetry    NA (Tele is currently not working) - Personally Reviewed  ECG    NA - Personally Reviewed  Physical Exam   GEN:   Nauseated and  Neck: No JVD Cardiac: RRR  mechanical S2, 2/6 apical systolic murmur, no diastolic murmurs, rubs, or gallops.  Respiratory:     Decreased breath sound bilateral with coarse crackles.  GI: Soft, nontender, non-distended  MS: No edema; No deformity. Neuro:  Nonfocal  Psych: Normal affect   Labs    Chemistry Recent Labs  Lab 12/17/23 2128 12/18/23 0455 12/19/23 0237 12/20/23 0241  NA 138 139 140 138  K 4.4 3.7 3.7 3.8  CL 112* 112* 108 108  CO2 21* 21* 22 20*  GLUCOSE 133* 109* 103* 107*  BUN 27* 24* 22 23  CREATININE 1.46* 1.48* 1.56* 1.53*  CALCIUM  8.4* 8.2* 8.4* 8.3*  PROT 5.8* 5.9*  --   --   ALBUMIN 2.5* 2.5*  --   --   AST 63* 49*  --   --   ALT 18 20  --   --   ALKPHOS 84 74  --   --   BILITOT 0.6 1.1   --   --   GFRNONAA 46* 45* 42* 43*  ANIONGAP 5 6 10 10      Hematology Recent Labs  Lab 12/18/23 0455 12/19/23 0237 12/20/23 0241  WBC 5.9 6.7 6.1  RBC 2.31* 2.23* 2.73*  HGB 7.7* 7.5* 8.9*  HCT 25.2* 23.4* 28.2*  MCV 109.1* 104.9* 103.3*  MCH 33.3 33.6 32.6  MCHC 30.6 32.1 31.6  RDW 16.6* 15.9* 17.5*  PLT 225 224 192    Cardiac EnzymesNo results for input(s): TROPONINI in the last 168 hours. No results for input(s): TROPIPOC in the last 168 hours.   BNP Recent Labs  Lab 12/17/23 2128  BNP 252.6*     DDimer No results for input(s): DDIMER in the last 168 hours.   Radiology    No results found.  Cardiac Studies   CXR:  Increasing left basilar opacity, favoring combination of left pleural effusion and left lung atelectasis with or without superimposed pneumonitis/consolidation. Correlate clinically. *Mild-to-moderate pulmonary vascular congestion. Correlate clinically for  pulmonary edema.  Patient Profile     89 y.o. male with a hx of CAD s/p CABG 2000, s/p mechanical aortic valve replacement 2000 on chronic Coumadin  therapy, persistent A-fib/flutter s/p cardioversion 2019 and ablation 05/2019, hypertension, aortic aneurysm, hypothyroidism, BPH, recurrent GI bleed, CKD stage III AA, cryptogenic cirrhosis, GERD, who is being seen 12/19/2023 for the evaluation of heart failure and anticoagulation at the request of Dr. Elpidio.    Assessment & Plan    SOB:  Acute on chronic diastolic dysfunction.  Net negative 2.8 liters with  Given IV Lasix  this evening.  Reassess volume status and creat in AM.   CXR as above with.  Antibiotic per primary team  Anemia:  INR is drifting down.  I have not suggested reversing this.   EGD completed.    GAVE treated with APC.  OK to resume warfarin tomorrow per the GI note.   Elevated trop:  Non diagnostic of acute coronary syndrome in this situation.  I suspect this is demand ischemia.  No plan for in patient ischemia work up this  admission.  He has had no recent chest pain.   Atrial fib:  No symptomatic recurrence.   As above.   AVR:   Stable valve function on echo in Feb.   CKD IIIa:  Creat did bump after Lasix  but is stable today.  Continue to follow.     For questions or updates, please contact [facility] Please consult [email or web address] for contact info under Cardiology/STEMI.   Signed, Lynwood Schilling, MD  12/20/2023, 7:45 AM

## 2023-12-20 NOTE — Op Note (Signed)
 [facility] Patient Name: Jason Santana Procedure Date : 12/20/2023 MRN: [id number] Attending MD: Sergio Dandy , MD, [id number] Date of Birth: [birth year] CSN: [id number] Age: 89 Admit Type: Inpatient Procedure:                Upper GI endoscopy Indications:              Suspected upper gastrointestinal bleeding, Iron                             deficiency anemia due to suspected upper                            gastrointestinal bleeding Providers:                Sergio Dandy, MD, Heber Little LPN, LPN,                            Adolfo Hooker, Technician Referring MD:              Medicines:                Monitored Anesthesia Care Complications:            No immediate complications. Estimated Blood Loss:     Estimated blood loss was minimal. Procedure:                Pre-Anesthesia Assessment:                           - Prior to the procedure, a History and Physical                            was performed, and patient medications and                            allergies were reviewed. The patient's tolerance of                            previous anesthesia was also reviewed. The risks                            and benefits of the procedure and the sedation                            options and risks were discussed with the patient.                            All questions were answered, and informed consent                            was obtained. Prior Anticoagulants: The patient                            last took Coumadin  (warfarin) 2 days prior to the  procedure. ASA Grade Assessment: III - A patient                            with severe systemic disease. After reviewing the                            risks and benefits, the patient was deemed in                            satisfactory condition to undergo the procedure.                           After obtaining informed consent, the endoscope was                             passed under direct vision. Throughout the                            procedure, the patient's blood pressure, pulse, and                            oxygen saturations were monitored continuously. The                            GIF-H190 ([id number]) Olympus endoscope was introduced                            through the mouth, and advanced to the second part                            of duodenum. The upper GI endoscopy was                            accomplished without difficulty. The patient                            tolerated the procedure well. Scope In: Scope Out: Findings:      The Z-line was regular and was found 38 cm from the incisors.      No gross lesions were noted in the entire esophagus.      Mild gastric antral vascular ectasia with bleeding was present in the       gastric antrum, in the prepyloric region of the stomach and in the       pylorus. Coagulation for hemostasis using argon plasma was successful.      The cardia and gastric fundus were normal on retroflexion.      The examined duodenum was normal. Impression:               - Z-line regular, 38 cm from the incisors.                           - No gross lesions in the entire esophagus.                           -  Gastric antral vascular ectasia with bleeding                            (GAVE). Treated with argon plasma coagulation (APC).                           - Normal examined duodenum.                           - No specimens collected. Recommendation:           - Patient has a contact number available for                            emergencies. The signs and symptoms of potential                            delayed complications were discussed with the                            patient. Return to normal activities tomorrow.                            Written discharge instructions were provided to the                            patient.                           - Full liquid diet today, then advance  as tolerated                            to soft diet.                           - Continue present medications.                           - Use Protonix  (pantoprazole ) 40 mg IV twice daily                            during hospitalization and then transition to oral                            on discharge.                           - Avoid NSAID's                           - Start Heparin  gtt if INR trends to sub                            therapeutic range, ok to restart coumadin  tomorrow  if hgb remains stable                           - Monitor Hgb and transfuse as needed                           - IV iron  infusion for severe iron  deficiency due                            to chronic occult GI bleed in the setting of GAVE Procedure Code(s):        --- Professional ---                           [phone], Esophagogastroduodenoscopy, flexible,                            transoral; with control of bleeding, any method Diagnosis Code(s):        --- Professional ---                           K31.811, Angiodysplasia of stomach and duodenum                            with bleeding                           D50.9, Iron  deficiency anemia, unspecified CPT copyright 2022 [employer] Association. All rights reserved. The codes documented in this report are preliminary and upon coder review may  be revised to meet current compliance requirements. Jason Kazlauskas V. Torra Pala, MD 12/20/2023 10:19:19 AM This report has been signed electronically. Number of Addenda: 0

## 2023-12-20 NOTE — Progress Notes (Addendum)
 PT Cancellation Note  Patient Details Name: ELIN SEATS MRN: [id number] DOB: [birth year]   Cancelled Treatment:    Reason Eval/Treat Not Completed: Patient at procedure or test/unavailable  8:40- Pt off unit for upper GI endoscopy.   15:10- Pt feeling nauseous and not able to participate in therapy at this time. Acute PT to follow.   Orysia Blas, PT, DPT Secure Chat Preferred  Rehab Office [phone]  Alissa April Adela Ades 12/20/2023, 8:40 AM

## 2023-12-20 NOTE — Progress Notes (Signed)
 OT Cancellation Note  Patient Details Name: RUGER SAXER MRN: [id number] DOB: 1931-09-14   Cancelled Treatment:    Reason Eval/Treat Not Completed: Medical issues which prohibited therapy (Pt with nausea. Requests therapy another day.)  Kennth Mliss Helling 12/20/2023, 3:11 PM Mliss HERO, OTR/L Acute Rehabilitation Services Office: [phone]

## 2023-12-20 NOTE — Transfer of Care (Signed)
 Immediate Anesthesia Transfer of Care Note  Patient: Jason Santana  Procedure(s) Performed: EGD (ESOPHAGOGASTRODUODENOSCOPY) EGD, WITH ARGON PLASMA COAGULATION  Patient Location: PACU  Anesthesia Type:MAC  Level of Consciousness: awake and sedated  Airway & Oxygen Therapy: Patient Spontanous Breathing and Patient connected to nasal cannula oxygen  Post-op Assessment: Report given to RN and Post -op Vital signs reviewed and stable  Post vital signs: Reviewed and stable  Last Vitals:  Vitals Value Taken Time  BP    Temp    Pulse    Resp    SpO2      Last Pain:  Vitals:   12/20/23 0850  TempSrc: Temporal  PainSc: 0-No pain         Complications: No notable events documented.

## 2023-12-20 NOTE — Interval H&P Note (Signed)
 History and Physical Interval Note:  12/20/2023 8:47 AM  Jason Santana  has presented today for surgery, with the diagnosis of gastrointestinal bleeding.  The various methods of treatment have been discussed with the patient and family. After consideration of risks, benefits and other options for treatment, the patient has consented to  Procedure(s): EGD (ESOPHAGOGASTRODUODENOSCOPY) (N/A) as a surgical intervention.  The patient's history has been reviewed, patient examined, no change in status, stable for surgery.  I have reviewed the patient's chart and labs.  Questions were answered to the patient's satisfaction.     [name]

## 2023-12-20 NOTE — Progress Notes (Signed)
 Progress Note   Patient: Jason Santana JWJ:[id number] DOB: 15-May-1932 DOA: 12/17/2023     3 DOS: the patient was seen and examined on 12/20/2023    Brief hospital course: 89 y.o. male with medical history significant of CAD, A-fib/flutter and mechanical aortic valve replacement on Coumadin , PVCs, CKD stage IIIa, hypertension, cryptogenic cirrhosis, GERD, hypothyroidism presenting with complaints of shortness of breath and bilateral lower extremity edema.  Patient states his doctor had reduced the dose of his home Lasix  from 40 mg daily to 20 mg daily a month ago.  Since then he is having progressively worsening dyspnea on exertion and bilateral lower extremity edema. Also with increasing melena outpatient basis.  Came to ED for the same.  Found to have Hgb 6.9 and given 1 unit PRBC   Assessment and Plan  Acute GI bleed Acute on chronic anemia secondary to blood loss Hemoglobin 6.8, previously 9.4 on 10/03/2023.  - GI consulted.  Appreciate input.  FOBT positive, INR 3.4.   - s/p 2 units now, Hgb goal >8 with CAD Patient underwent EGD by gastroenterologist today that showed mild gastritis with bleeding in the gastric antrum, as well as the prepyloric region of the stomach and in the pylorus.  This was manage intra operatively and hemostasis was achieved. Continue to avoid NSAIDs Continue IV Protonix  twice daily and will transition to oral at discharge   Sepsis secondary to left-sided pneumonia Overnight patient spiked temperature up to 101.3 tachycardia of 100 Chest x-ray showing increased infiltrate on the left side Patient initiated on ceftriaxone  and azithromycin  Follow-up on culture results as well as urinalysis  Acute on chronic HFpEF - DOE likely secondary to acute anemia - BNP 252 and CXR with cardiomegaly - not currently hypoxic/on RA - with some bump in troponin, likely strain - Given x-ray showing worsening pulmonary edema patient was given a dose of Lasix  today We will  monitor on daily basis the need for Lasix  as renal function allows - monitor weights and I/Os   Elevated troponin History of CAD Troponin 255> 251.  Patient is not endorsing chest pain.   Likely demand ischemia, appreciate cards input    Paroxysmal A-fib/flutter History of mechanical aortic valve replacement Hold Coumadin  at this time given concern for acute GI bleed and slightly supratherapeutic INR.  Since patient is hemodynamically stable and no overt bleeding, will hold off giving vitamin K at this time and monitor INR.  Continue diltiazem . Continue to monitor INR closely   History of PVCs Continue diltiazem .   CKD stage IIIa Creatinine stable, monitor labs.   Hypertension Blood pressure currently stable.   Continue diltiazem     Cryptogenic cirrhosis No signs of hepatic encephalopathy or ascites at this time.   Hypothyroidism Continue Synthroid .      Family Communication: wife at bedside.    Disposition: Status is: Inpatient  Planned Discharge Destination: Home     Subjective:  Seen and examined at bedside this morning Patient had a temperature spike up to 101.3 I have ordered blood cultures, chest x-ray as well as urinalysis Chest x-ray showing increased infiltrate on the left lung as well as possible underlying pulmonary edema   Physical Exam: Gen:  Alert, cooperative patient who appears stated age in no acute distress.  Vital signs reviewed. Cardiac:  Regular rate and rhythm without murmur auscultated. Mechanical click noted  Pulm: Minimal crackles noted at the bases Abd:  S/ND/NT Ext:  +1 edema BL LE's with hemosiderin staining.  Some TTP BL legs.  No redness or warmth Neuro:  awake and fully oriented.  Moving all limbs symmetrically.       Data Reviewed:       Latest Ref Rng & Units 12/20/2023    2:41 AM 12/19/2023    2:37 AM 12/18/2023    4:55 AM  CBC  WBC 4.0 - 10.5 K/uL 6.1  6.7  5.9   Hemoglobin 13.0 - 17.0 g/dL 8.9  7.5  7.7   Hematocrit 39.0  - 52.0 % 28.2  23.4  25.2   Platelets 150 - 400 K/uL 192  224  225        Latest Ref Rng & Units 12/20/2023    2:41 AM 12/19/2023    2:37 AM 12/18/2023    4:55 AM  BMP  Glucose 70 - 99 mg/dL 409  811  914   BUN 8 - 23 mg/dL 23  22  24    Creatinine 0.61 - 1.24 mg/dL 7.82  9.56  2.13   Sodium 135 - 145 mmol/L 138  140  139   Potassium 3.5 - 5.1 mmol/L 3.8  3.7  3.7   Chloride 98 - 111 mmol/L 108  108  112   CO2 22 - 32 mmol/L 20  22  21    Calcium  8.9 - 10.3 mg/dL 8.3  8.4  8.2     Vitals:   12/20/23 1011 12/20/23 1020 12/20/23 1030 12/20/23 1201  BP: (!) 132/57 (!) 132/53 (!) 123/51 (!) 140/56  Pulse: 82 82 81 92  Resp: (!) 22 (!) 24 (!) 25 (!) 24  Temp: (!) 97.3 F (36.3 C)   98.7 F (37.1 C)  TempSrc: Temporal   Oral  SpO2: 98% 93% 93% 93%  Weight:      Height:         Time spent: 40 minutes  Author: Ezzard Holms, MD 12/20/2023 12:12 PM  For on call review [email or web address].

## 2023-12-20 NOTE — Anesthesia Postprocedure Evaluation (Signed)
 Anesthesia Post Note  Patient: Jason Santana  Procedure(s) Performed: EGD (ESOPHAGOGASTRODUODENOSCOPY) EGD, WITH ARGON PLASMA COAGULATION     Patient location during evaluation: PACU Anesthesia Type: MAC Level of consciousness: awake and alert Pain management: pain level controlled Vital Signs Assessment: post-procedure vital signs reviewed and stable Respiratory status: spontaneous breathing, nonlabored ventilation, respiratory function stable and patient connected to nasal cannula oxygen Cardiovascular status: stable and blood pressure returned to baseline Postop Assessment: no apparent nausea or vomiting Anesthetic complications: no   No notable events documented.  Last Vitals:  Vitals:   12/20/23 1030 12/20/23 1201  BP: (!) 123/51 (!) 140/56  Pulse: 81 92  Resp: (!) 25 (!) 24  Temp:  37.1 C  SpO2: 93% 93%    Last Pain:  Vitals:   12/20/23 1201  TempSrc: Oral  PainSc:                  Cordella SQUIBB Temiloluwa Recchia

## 2023-12-21 ENCOUNTER — Inpatient Hospital Stay (HOSPITAL_COMMUNITY)

## 2023-12-21 DIAGNOSIS — K31811 Angiodysplasia of stomach and duodenum with bleeding: Secondary | ICD-10-CM | POA: Diagnosis not present

## 2023-12-21 DIAGNOSIS — D649 Anemia, unspecified: Secondary | ICD-10-CM | POA: Diagnosis not present

## 2023-12-21 DIAGNOSIS — I4892 Unspecified atrial flutter: Secondary | ICD-10-CM

## 2023-12-21 DIAGNOSIS — I25700 Atherosclerosis of coronary artery bypass graft(s), unspecified, with unstable angina pectoris: Secondary | ICD-10-CM | POA: Diagnosis not present

## 2023-12-21 DIAGNOSIS — I480 Paroxysmal atrial fibrillation: Secondary | ICD-10-CM | POA: Diagnosis not present

## 2023-12-21 DIAGNOSIS — N1831 Chronic kidney disease, stage 3a: Secondary | ICD-10-CM | POA: Diagnosis not present

## 2023-12-21 DIAGNOSIS — D62 Acute posthemorrhagic anemia: Secondary | ICD-10-CM | POA: Diagnosis not present

## 2023-12-21 DIAGNOSIS — E611 Iron deficiency: Secondary | ICD-10-CM | POA: Diagnosis not present

## 2023-12-21 DIAGNOSIS — E039 Hypothyroidism, unspecified: Secondary | ICD-10-CM | POA: Diagnosis not present

## 2023-12-21 DIAGNOSIS — I5023 Acute on chronic systolic (congestive) heart failure: Secondary | ICD-10-CM | POA: Diagnosis not present

## 2023-12-21 DIAGNOSIS — I10 Essential (primary) hypertension: Secondary | ICD-10-CM | POA: Diagnosis not present

## 2023-12-21 DIAGNOSIS — I5033 Acute on chronic diastolic (congestive) heart failure: Secondary | ICD-10-CM | POA: Diagnosis not present

## 2023-12-21 DIAGNOSIS — K922 Gastrointestinal hemorrhage, unspecified: Secondary | ICD-10-CM | POA: Diagnosis not present

## 2023-12-21 DIAGNOSIS — J189 Pneumonia, unspecified organism: Secondary | ICD-10-CM

## 2023-12-21 LAB — BLOOD CULTURE ID PANEL (REFLEXED) - BCID2

## 2023-12-21 LAB — BASIC METABOLIC PANEL WITH GFR
Anion gap: 9 (ref 5–15)
BUN: 25 mg/dL — ABNORMAL HIGH (ref 8–23)
CO2: 23 mmol/L (ref 22–32)
Calcium: 8.2 mg/dL — ABNORMAL LOW (ref 8.9–10.3)
Chloride: 106 mmol/L (ref 98–111)
Creatinine, Ser: 1.83 mg/dL — ABNORMAL HIGH (ref 0.61–1.24)
GFR, Estimated: 35 mL/min — ABNORMAL LOW (ref >60)
Glucose, Bld: 112 mg/dL — ABNORMAL HIGH (ref 70–99)
Potassium: 4.1 mmol/L (ref 3.5–5.1)
Sodium: 138 mmol/L (ref 135–145)

## 2023-12-21 LAB — CBC
HCT: 29.9 % — ABNORMAL LOW (ref 39.0–52.0)
Hemoglobin: 9.2 g/dL — ABNORMAL LOW (ref 13.0–17.0)
MCH: 32.1 pg (ref 26.0–34.0)
MCHC: 30.8 g/dL (ref 30.0–36.0)
MCV: 104.2 fL — ABNORMAL HIGH (ref 80.0–100.0)
Platelets: 169 10*3/uL (ref 150–400)
RBC: 2.87 MIL/uL — ABNORMAL LOW (ref 4.22–5.81)
RDW: 16.4 % — ABNORMAL HIGH (ref 11.5–15.5)
WBC: 5.2 10*3/uL (ref 4.0–10.5)
nRBC: 0 % (ref 0.0–0.2)

## 2023-12-21 LAB — PROTIME-INR
INR: 1.9 — ABNORMAL HIGH (ref 0.8–1.2)
Prothrombin Time: 21.9 s — ABNORMAL HIGH (ref 11.4–15.2)

## 2023-12-21 MED ORDER — IRON SUCROSE 500 MG IVPB - SIMPLE MED
500.0000 mg | Freq: Once | INTRAVENOUS | Status: DC
Start: 2023-12-21 — End: 2023-12-21
  Filled 2023-12-21: qty 275

## 2023-12-21 MED ORDER — CEPHALEXIN 500 MG PO CAPS
500.0000 mg | ORAL_CAPSULE | Freq: Two times a day (BID) | ORAL | Status: DC
  Administered 2023-12-21 – 2023-12-22 (×3): 500 mg via ORAL
  Filled 2023-12-21 (×4): qty 1

## 2023-12-21 MED ORDER — SODIUM CHLORIDE 0.9 % IV SOLN
500.0000 mg | Freq: Once | INTRAVENOUS | Status: AC
  Administered 2023-12-21: 500 mg via INTRAVENOUS
  Filled 2023-12-21: qty 25

## 2023-12-21 MED ORDER — WARFARIN SODIUM 5 MG PO TABS
5.0000 mg | ORAL_TABLET | Freq: Once | ORAL | Status: AC
  Administered 2023-12-21: 5 mg via ORAL
  Filled 2023-12-21: qty 1

## 2023-12-21 MED ORDER — WARFARIN SODIUM 1 MG PO TABS: 1.0000 mg | ORAL_TABLET | Freq: Once | ORAL | Status: DC

## 2023-12-21 MED ORDER — WARFARIN - PHARMACIST DOSING INPATIENT: Freq: Every day | Status: DC

## 2023-12-21 NOTE — Evaluation (Signed)
 Physical Therapy Evaluation Patient Details Name: Jason Santana MRN: [id number] DOB: August 01, 1932 Today's Date: 12/21/2023  History of Present Illness  Pt is an 89 y/o M presenting to ED on 4/15 wtih BLE edema x1 week and worsening dyspnea, admitted for acute GI bleed. CXR with L basilar opacity favoring combination of L pleural effusion, L lung atelectasis. 4/18 EGD and upper GI endoscopy. PMH includes A fib, A flutter, AVR, PVCs, CKD IIIA, HTN, cryptogenic cirrhosis, GERD, hypothryoidism   Clinical Impression  Pt in bed upon arrival and agreeable to PT eval. PTA, pt was independent for mobility with no AD. In today's session, pt required CGA for bed mobility and MinAx2 for safety to stand with a RW. He was then able to ambulate ~120 ft with CGA and RW. Pt currently with functional limitations due to the deficits listed below (see PT Problem List). Pt would benefit from acute skilled PT to address functional impairments. Pt has 24/7 light physical assist available upon return home. Recommending post-acute HHPT to work towards independence with mobility. Acute PT to follow.         If plan is discharge home, recommend the following: A little help with walking and/or transfers;A little help with bathing/dressing/bathroom;Assistance with cooking/housework;Assist for transportation;Help with stairs or ramp for entrance   Can travel by private vehicle   Yes     Equipment Recommendations Rolling walker (2 wheels)     Functional Status Assessment Patient has had a recent decline in their functional status and demonstrates the ability to make significant improvements in function in a reasonable and predictable amount of time.     Precautions / Restrictions Precautions Precautions: Fall Restrictions Weight Bearing Restrictions Per Provider Order: No      Mobility  Bed Mobility Overal bed mobility: Needs Assistance Bed Mobility: Supine to Sit    Supine to sit: Contact guard        Transfers Overall transfer level: Needs assistance Equipment used: Rolling walker (2 wheels) Transfers: Sit to/from Stand Sit to Stand: Min assist, +2 safety/equipment  General transfer comment: MinA for slight boost-up with +2 for safety with lines. Cues for hand placement    Ambulation/Gait Ambulation/Gait assistance: Contact guard assist Gait Distance (Feet): 120 Feet Assistive device: Rolling walker (2 wheels) Gait Pattern/deviations: Step-through pattern, Decreased stride length Gait velocity: decr     General Gait Details: slow and steady gait with RW, no overt LOB    Balance Overall balance assessment: Needs assistance Sitting-balance support: Feet supported Sitting balance-Leahy Scale: Good     Standing balance support: During functional activity, Reliant on assistive device for balance Standing balance-Leahy Scale: Poor Standing balance comment: reliant on RW support       Pertinent Vitals/Pain Pain Assessment Pain Assessment: No/denies pain    Home Living Family/patient expects to be discharged to:: Private residence Living Arrangements: Spouse/significant other Available Help at Discharge: Family;Available 24 hours/day Type of Home: House Home Access: Stairs to enter Entrance Stairs-Rails: Right Entrance Stairs-Number of Steps: 4   Home Layout: One level;Laundry or work area in [employer] Equipment: Jeananne Mighty - single point      Prior Function Prior Level of Function : Independent/Modified Independent;Driving    Mobility Comments: no AD ADLs Comments: ind     Extremity/Trunk Assessment   Upper Extremity Assessment Upper Extremity Assessment: Defer to OT evaluation    Lower Extremity Assessment Lower Extremity Assessment: Generalized weakness (Reports prior R ankle fusion, limited ankle DF/PF ROM)    Cervical / Trunk Assessment  Cervical / Trunk Assessment: Normal  Communication   Communication Communication: No apparent difficulties     Cognition Arousal: Alert Behavior During Therapy: WFL for tasks assessed/performed   PT - Cognitive impairments: No apparent impairments    Following commands: Intact       Cueing Cueing Techniques: Verbal cues, Gestural cues     General Comments General comments (skin integrity, edema, etc.): SpO2 down to mid 80s on RA, pleth poor     PT Assessment Patient needs continued PT services  PT Problem List Decreased strength;Decreased activity tolerance;Decreased balance;Decreased mobility       PT Treatment Interventions DME instruction;Gait training;Stair training;Functional mobility training;Therapeutic activities;Therapeutic exercise;Balance training;Neuromuscular re-education;Patient/family education    PT Goals (Current goals can be found in the Care Plan section)  Acute Rehab PT Goals Patient Stated Goal: to be able to move more PT Goal Formulation: With patient/family Time For Goal Achievement: 01/04/24 Potential to Achieve Goals: Good    Frequency Min 2X/week        AM-PAC PT "6 Clicks" Mobility  Outcome Measure Help needed turning from your back to your side while in a flat bed without using bedrails?: A Little Help needed moving from lying on your back to sitting on the side of a flat bed without using bedrails?: A Little Help needed moving to and from a bed to a chair (including a wheelchair)?: A Little Help needed standing up from a chair using your arms (e.g., wheelchair or bedside chair)?: A Little Help needed to walk in hospital room?: A Little Help needed climbing 3-5 steps with a railing? : A Little 6 Click Score: 18    End of Session Equipment Utilized During Treatment: Gait belt Activity Tolerance: Patient tolerated treatment well Patient left: in chair;with call bell/phone within reach;with chair alarm set;with family/visitor present Nurse Communication: Mobility status PT Visit Diagnosis: Other abnormalities of gait and mobility (R26.89);Muscle  weakness (generalized) (M62.81)    Time: 1610-9604 PT Time Calculation (min) (ACUTE ONLY): 26 min   Charges:   PT Evaluation $PT Eval Low Complexity: 1 Low   PT General Charges $$ ACUTE PT VISIT: 1 Visit    Orysia Blas, PT, DPT Secure Chat Preferred  Rehab Office [phone]   Alissa April Adela Ades 12/21/2023, 12:31 PM

## 2023-12-21 NOTE — Assessment & Plan Note (Addendum)
 Acute blood loss anemia.  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  Transitioned to po pantoprazole .  Continue anticoagulation with warfarin/ heparin  IV   Follow up Hgb is 8.3 with no signs of recurrent bleeding.  Iron  deficiency anemia, sp IV iron  sucrose 500 mg   Continue to follow up cell count.

## 2023-12-21 NOTE — Assessment & Plan Note (Addendum)
 Continue rate control with diltiazem  and anticoagulation with warfarin.  Bridging with heparin  for subtherapeutic INR

## 2023-12-21 NOTE — Assessment & Plan Note (Addendum)
 Patient has been on antibiotic therapy with ceftriaxone  and azithromycin , for possible pneumonia.  No leukocytosis and no fever.  04/18 Blood culture one of two positive for streptococcus mitis/oralis  (Possible bacteremia)   04/19 Follow up chest radiograph with improvement in infiltrates, personally reviewed, possible small left effusion.   In the setting of aortic mechanical valve, plan is to continue IV antibiotic therapy with ceftriaxone  and rule out endocarditis with TEE.  Follow up blood cultures with no growth.

## 2023-12-21 NOTE — Assessment & Plan Note (Signed)
 Echocardiogram with preserved LV systolic function with EF 60 to 65%, mild LVH, RV systolic function low normal, RVSP 53,7 mmHg. LA with moderate dilation, moderate tricuspid regurgitation, St Jude mechanical valve at the aortic position, with adequate functioning.   Acute on chronic core pulmonale Pulmonary hypertension.   Urine output is 752 ml Systolic blood pressure 120 to 140 mmHg.   Resuming loop diuretic today with furosemide  40 mg IV today, possible po tomorrow depending on renal function.  Limited pharmacologic options due to acutely reduced GFR.   INR 1,8, continue IV heparin  for bridging, continue warfarin per pharmacy protocol.

## 2023-12-21 NOTE — Progress Notes (Signed)
 Progress Note   Patient: Jason Santana WGN:[id number] DOB: 08-31-1932 DOA: 12/17/2023     4 DOS: the patient was seen and examined on 12/21/2023   Brief hospital course: Mr. Ricciardi was admitted to the hospital with the working diagnosis of acute blood loss anemia due to upper GI bleed.   89 y.o. male with medical history significant of CAD, A-fib/flutter and mechanical aortic valve replacement on Coumadin , PVCs, CKD stage IIIa, hypertension, cryptogenic cirrhosis, GERD, hypothyroidism presenting with complaints of shortness of breath and bilateral lower extremity edema.   Patient states his doctor had reduced the dose of his home Lasix  from 40 mg daily to 20 mg daily a month ago.  Since then he is having progressively worsening dyspnea on exertion and bilateral lower extremity edema. Also with increasing melena outpatient basis.  Came to ED for the same.  Found to have Hgb 6.9 and given 1 unit PRBC   Assessment and Plan: * Acute on chronic diastolic CHF (congestive heart failure) (HCC) Echocardiogram with preserved LV systolic function with EF 60 to 65%, mild LVH, RV systolic function low normal, RVSP 53,7 mmHg. LA with moderate dilation, moderate tricuspid regurgitation, St Jude mechanical valve at the aortic position, with adequate functioning.   Acute on chronic core pulmonale Pulmonary hypertension.   Urine output is 1550 ml Systolic blood pressure 120 to 140 mmHg.   Improved volume status, holding loop diuretic for now.  Limited pharmacologic options due to acutely reduced GFR.    Acute GI bleeding Acute blood loss anemia.  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  Continue with pantoprazole  IV bid.  Ok to resume warfarin.   Follow up Hgb is 9,2 with no signs of recurrent bleeding.   Iron  deficiency anemia, sp IV iron  sucrose 500 mg    Paroxysmal atrial flutter (HCC) Continue rate control with diltiazem  and  anticoagulation with warfarin.   CAD (coronary artery disease) of artery bypass graft No chest pain, no acute coronary syndrome.   Cirrhosis of liver without ascites (HCC) No signs of acute decompensation Continue close follow up.   Essential hypertension Continue blood pressure monitoring.  Continue with diltiazem  for atrial fibrillation   Stage 3a chronic kidney disease (HCC) Renal function with serum cr at 1,83 with K at 4,1 and serum bicarbonate at 23  Na 138   Continue to hold on furosemide  for now Follow up renal function and electrolytes in am.   Hypothyroidism Continue with levothyroxine    Left lower lobe pneumonia Patient has been on antibiotic therapy with ceftriaxone  and azithromycin . No leukocytosis and no fever.  Blood culture one of two positive for streptococcus species. (Possible contamination).   Follow up chest radiograph with improvement in infiltrates, personally reviewed, possible small left effusion.  Will de escalate antibiotic therapy to cephalexin  for 3 more days, to complete 5 days total  Repeat blood cultures tomorrow.        Subjective: patient is feeling better, dyspnea has improved, no chest pain. Edema has decreased. His left upper extremity IV infiltrated provoking pain that now has improved.   Physical Exam: Vitals:   12/20/23 1946 12/20/23 2333 12/21/23 0509 12/21/23 0751  BP: (!) 94/50 (!) 112/57 (!) 120/54 (!) 119/53  Pulse: 89 78 75   Resp: 20 12 20    Temp: 99.3 F (37.4 C) 99.1 F (37.3 C) 98.8 F (37.1 C) 98.4 F (36.9 C)  TempSrc: Oral Oral Oral Oral  SpO2: 91% 93%  91%   Weight:   76.2 kg   Height:       Neurology awake and alert ENT with mild pallor Cardiovascular with S1 and S2 present, with positive metallic S2 at the base, with no rubs, positive systolic murmur at the right lower sternal border Respiratory with mild rales at bases with no wheezing or rhonchi  Abdomen with no distention  Lower extremity with trace  edema, non potting   Data Reviewed:    Family Communication: I spoke with patient's wife at the bedside, we talked in detail about patient's condition, plan of care and prognosis and all questions were addressed.   Disposition: Status is: Inpatient Remains inpatient appropriate because: recovering heart failure   Planned Discharge Destination: Home     Author: Albertus Alt, MD 12/21/2023 11:22 AM  For on call review [email or web address].

## 2023-12-21 NOTE — Assessment & Plan Note (Signed)
 Renal function with serum cr at 1,83 with K at 4,1 and serum bicarbonate at 23  Na 138   Continue to hold on furosemide  for now Follow up renal function and electrolytes in am.

## 2023-12-21 NOTE — Progress Notes (Signed)
 PHARMACY - PHYSICIAN COMMUNICATION CRITICAL VALUE ALERT - BLOOD CULTURE IDENTIFICATION (BCID)  Jason Santana is an 89 y.o. male who presented to [facility] on 12/17/2023 with a chief complaint of shortness breath, lower extremity edema  Name of physician (or Provider) Contacted: Dr. [facility] Lavender   Current antibiotics: Ceftriaxone , Azithromycin    Changes to prescribed antibiotics recommended:  Likely contaminant, no changes  Results for orders placed or performed during the hospital encounter of 12/17/23  Blood Culture ID Panel (Reflexed) (Collected: 12/20/2023 11:26 AM)  Result Value Ref Range   Enterococcus faecalis NOT DETECTED NOT DETECTED   Enterococcus Faecium NOT DETECTED NOT DETECTED   Listeria monocytogenes NOT DETECTED NOT DETECTED   Staphylococcus species NOT DETECTED NOT DETECTED   Staphylococcus aureus (BCID) NOT DETECTED NOT DETECTED   Staphylococcus epidermidis NOT DETECTED NOT DETECTED   Staphylococcus lugdunensis NOT DETECTED NOT DETECTED   Streptococcus species DETECTED (A) NOT DETECTED   Streptococcus agalactiae NOT DETECTED NOT DETECTED   Streptococcus pneumoniae NOT DETECTED NOT DETECTED   Streptococcus pyogenes NOT DETECTED NOT DETECTED   A.calcoaceticus-baumannii NOT DETECTED NOT DETECTED   Bacteroides fragilis NOT DETECTED NOT DETECTED   Enterobacterales NOT DETECTED NOT DETECTED   Enterobacter cloacae complex NOT DETECTED NOT DETECTED   Escherichia coli NOT DETECTED NOT DETECTED   Klebsiella aerogenes NOT DETECTED NOT DETECTED   Klebsiella oxytoca NOT DETECTED NOT DETECTED   Klebsiella pneumoniae NOT DETECTED NOT DETECTED   Proteus species NOT DETECTED NOT DETECTED   Salmonella species NOT DETECTED NOT DETECTED   Serratia marcescens NOT DETECTED NOT DETECTED   Haemophilus influenzae NOT DETECTED NOT DETECTED   Neisseria meningitidis NOT DETECTED NOT DETECTED   Pseudomonas aeruginosa NOT DETECTED NOT DETECTED   Stenotrophomonas maltophilia NOT DETECTED NOT  DETECTED   Candida albicans NOT DETECTED NOT DETECTED   Candida auris NOT DETECTED NOT DETECTED   Candida glabrata NOT DETECTED NOT DETECTED   Candida krusei NOT DETECTED NOT DETECTED   Candida parapsilosis NOT DETECTED NOT DETECTED   Candida tropicalis NOT DETECTED NOT DETECTED   Cryptococcus neoformans/gattii NOT DETECTED NOT DETECTED    [name], [name] 12/21/2023  5:06 AM

## 2023-12-21 NOTE — Progress Notes (Addendum)
 Pharmacy Consult for Warfarin Indication: atrial fibrillation and mAVR  Allergies  Allergen Reactions   Carvedilol  Shortness Of Breath    wheezing   Lopid [Gemfibrozil] Other (See Comments)    transaminitis   Monopril [Fosinopril] Other (See Comments)    transaminitis   Vicodin [Hydrocodone-Acetaminophen ]     Severe sensitivity   Phenergan [Promethazine Hcl] Other (See Comments)    Severe somnolence.   Zocor [Simvastatin]     Short memory loss.   Benicar [Olmesartan]     Abdominal cramping and increased stools/ irritable bowel   Codeine Nausea And Vomiting   Prilosec [Omeprazole]     dyspepsia    Patient Measurements: Height: 5\' 10"  (177.8 cm) Weight: 76.2 kg (167 lb 15.9 oz) IBW/kg (Calculated) : 73 HEPARIN  DW (KG): 82.3  Vital Signs: Temp: 98.4 F (36.9 C) (04/19 0751) Temp Source: Oral (04/19 0751) BP: 119/53 (04/19 0751) Pulse Rate: 75 (04/19 0509)  Labs: Recent Labs    12/19/23 0237 12/19/23 1441 12/20/23 0241 12/21/23 0206  HGB 7.5*  --  8.9* 9.2*  HCT 23.4*  --  28.2* 29.9*  PLT 224  --  192 169  LABPROT  --  30.3* 27.6*  --   INR  --  2.9* 2.5*  --   CREATININE 1.56*  --  1.53* 1.83*    Estimated Creatinine Clearance: 28.3 mL/min (A) (by C-G formula based on SCr of 1.83 mg/dL (H)).  Assessment: Jason Santana a 89 y.o. male presented with GIB, now s/p EGD 4/18. In EGD op note, GI states it is ok to resume warfarin on 4/19, confirmed OK to restart with TRH. OF note, patient is currently on azithromycin  for possible pneumonia. Given this DDI, which would likely lead to increased INR, recent GIB with diminished PO intake, and INR on last check 2.5 (4/18), will initiate warfarin at lower than PTA dosing and monitor closely.   Anticoagulation PTA: Warfarin PTA dosing: 5 mg M/F, 7.5 mg all other days   Goal of Therapy:  INR 2-3 Monitor platelets by anticoagulation protocol: Yes   Plan:  Warfarin 1 mg PO x1  CBC/INR daily    ADDENDUM:  INR on  recheck today just below goal of 2 with INR 1.9. Will increase today's dose to 5 mg.   Chrystie Crass, PharmD Clinical Pharmacist  12/21/2023 8:24 AM

## 2023-12-21 NOTE — Assessment & Plan Note (Signed)
 Continue with levothyroxine

## 2023-12-21 NOTE — Assessment & Plan Note (Signed)
No chest pain, no acute coronary syndrome.  

## 2023-12-21 NOTE — Evaluation (Signed)
 Occupational Therapy Evaluation Patient Details Name: Jason Santana MRN: [id number] DOB: [birth year] Today's Date: 12/21/2023   History of Present Illness   Pt is an 89 y/o M presenting to ED on 4/15 wtih BLE edema x1 week and worsening dyspnea, admitted for acute GI bleed. CXR with L basilar opacity favoring combination of L pleural effusion, L lung atelectasis. PMH includes A fib, A flutter, AVR, PVCs, CKD IIIA, HTN, cryptogenic cirrhosis, GERD, hypothryoidism     Clinical Impressions Pt reports ind at baseline with ADLs/functional mobility, lives with spouse who can assist at d/c. Pt currently needing min A for ADLs, CGA for bed mobility and minA +2 for transfers with RW. Pt SpO2 down to mid 80s on RA with ambulating, pleth poor, and incr to 90s once returned to chair in room. Pt presenting with impairments listed below, will follow acutely. Recommend HHOT at d/c.      If plan is discharge home, recommend the following:   A little help with walking and/or transfers;A little help with bathing/dressing/bathroom;Assistance with cooking/housework;Assist for transportation;Help with stairs or ramp for entrance     Functional Status Assessment   Patient has had a recent decline in their functional status and demonstrates the ability to make significant improvements in function in a reasonable and predictable amount of time.     Equipment Recommendations   Tub/shower seat;Other (comment) (RW)     Recommendations for Other Services   PT consult     Precautions/Restrictions   Precautions Precautions: Fall Restrictions Weight Bearing Restrictions Per Provider Order: No     Mobility Bed Mobility Overal bed mobility: Needs Assistance Bed Mobility: Supine to Sit     Supine to sit: Contact guard          Transfers Overall transfer level: Needs assistance Equipment used: Rolling walker (2 wheels) Transfers: Sit to/from Stand Sit to Stand: Min assist, +2  safety/equipment                  Balance Overall balance assessment: Needs assistance Sitting-balance support: Feet supported Sitting balance-Leahy Scale: Good     Standing balance support: During functional activity, Reliant on assistive device for balance Standing balance-Leahy Scale: Poor Standing balance comment: reliant on RW support                           ADL either performed or assessed with clinical judgement   ADL Overall ADL's : Needs assistance/impaired Eating/Feeding: Set up;Sitting   Grooming: Set up;Standing   Upper Body Bathing: Minimal assistance;Sitting   Lower Body Bathing: Minimal assistance;Sitting/lateral leans   Upper Body Dressing : Minimal assistance;Sitting   Lower Body Dressing: Minimal assistance;Sitting/lateral leans   Toilet Transfer: Minimal assistance;+2 for safety/equipment           Functional mobility during ADLs: Minimal assistance;+2 for safety/equipment       Vision Baseline Vision/History: 1 Wears glasses Vision Assessment?: No apparent visual deficits     Perception Perception: Not tested       Praxis Praxis: Not tested       Pertinent Vitals/Pain Pain Assessment Pain Assessment: No/denies pain     Extremity/Trunk Assessment Upper Extremity Assessment Upper Extremity Assessment: Generalized weakness   Lower Extremity Assessment Lower Extremity Assessment: Defer to PT evaluation   Cervical / Trunk Assessment Cervical / Trunk Assessment: Normal   Communication Communication Communication: No apparent difficulties   Cognition Arousal: Alert Behavior During Therapy: WFL for tasks assessed/performed Cognition: No  apparent impairments                               Following commands: Intact       Cueing  General Comments   Cueing Techniques: Verbal cues;Gestural cues  SpO2 down to mid 80s on RA, pleth poor   Exercises     Shoulder Instructions      Home Living  Family/patient expects to be discharged to:: Private residence Living Arrangements: Spouse/significant other Available Help at Discharge: Family;Available 24 hours/day Type of Home: House Home Access: Stairs to enter [employer] of Steps: 4 Entrance Stairs-Rails: Right Home Layout: One level;Laundry or work area in basement     [employer]: Producer, television/film/video: Handicapped height     Home Equipment: Cane - single point          Prior Functioning/Environment Prior Level of Function : Independent/Modified Independent;Driving             Mobility Comments: no AD ADLs Comments: ind    OT Problem List: Decreased strength;Decreased range of motion;Impaired balance (sitting and/or standing);Decreased activity tolerance   OT Treatment/Interventions: Self-care/ADL training;Therapeutic exercise;Energy conservation;DME and/or AE instruction;Therapeutic activities;Balance training;Patient/family education      OT Goals(Current goals can be found in the care plan section)   Acute Rehab OT Goals Patient Stated Goal: none stated OT Goal Formulation: With patient Time For Goal Achievement: 01/04/24 Potential to Achieve Goals: Good ADL Goals Pt Will Perform Upper Body Dressing: with modified independence;sitting Pt Will Perform Lower Body Dressing: with modified independence;sitting/lateral leans;sit to/from stand Pt Will Transfer to Toilet: with modified independence;ambulating;regular height toilet Pt Will Perform Tub/Shower Transfer: Shower transfer;with modified independence;ambulating Additional ADL Goal #1: pt will tolerate OOB activity x10 min wtih SpO2 above 90% in prep for ADLs   OT Frequency:  Min 2X/week    Co-evaluation              AM-PAC OT "6 Clicks" Daily Activity     Outcome Measure Help from another person eating meals?: None Help from another person taking care of personal grooming?: A Little Help from another person  toileting, which includes using toliet, bedpan, or urinal?: A Little Help from another person bathing (including washing, rinsing, drying)?: A Little Help from another person to put on and taking off regular upper body clothing?: A Little Help from another person to put on and taking off regular lower body clothing?: A Little 6 Click Score: 19   End of Session Equipment Utilized During Treatment: Rolling walker (2 wheels);Gait belt Nurse Communication: Mobility status;Need for lift equipment  Activity Tolerance: Patient tolerated treatment well Patient left: with call bell/phone within reach;in chair;with family/visitor present;with chair alarm set  OT Visit Diagnosis: Unsteadiness on feet (R26.81);Other abnormalities of gait and mobility (R26.89);Muscle weakness (generalized) (M62.81)                Time: 1005-1030 OT Time Calculation (min): 25 min Charges:  OT General Charges $OT Visit: 1 Visit OT Evaluation $OT Eval Moderate Complexity: 1 Mod  Nereyda Bowler K, OTD, OTR/L SecureChat Preferred Acute Rehab [phone]   Sandra Brents K Koonce 12/21/2023, 12:01 PM

## 2023-12-21 NOTE — Progress Notes (Signed)
 Progress Note  Patient Name: Jason Santana Date of Encounter: 12/21/2023  Primary Cardiologist: Dorothye Gathers, MD   Subjective   Patient seen and examined by his bedside.  His wife is in the.  He tells me he felt rested.  He tells me that last night was the first night he actually had good sleep since he has been hospitalized.  Still experiencing some coughing  shortness of breath.  Inpatient Medications    Scheduled Meds:  dextromethorphan -guaiFENesin   2 tablet Oral BID   diltiazem   120 mg Oral Daily   levothyroxine   125 mcg Oral QAC breakfast   pantoprazole  (PROTONIX ) IV  40 mg Intravenous Q12H   warfarin  1 mg Oral ONCE-1600   Warfarin - Pharmacist Dosing Inpatient   Does not apply q1600   Continuous Infusions:  azithromycin  500 mg (12/20/23 1614)   cefTRIAXone  (ROCEPHIN )  IV 2 g (12/20/23 1509)   PRN Meds: acetaminophen , loratadine , ondansetron  (ZOFRAN ) IV   Vital Signs    Vitals:   12/20/23 1946 12/20/23 2333 12/21/23 0509 12/21/23 0751  BP: (!) 94/50 (!) 112/57 (!) 120/54 (!) 119/53  Pulse: 89 78 75   Resp: 20 12 20    Temp: 99.3 F (37.4 C) 99.1 F (37.3 C) 98.8 F (37.1 C) 98.4 F (36.9 C)  TempSrc: Oral Oral Oral Oral  SpO2: 91% 93% 91%   Weight:   76.2 kg   Height:        Intake/Output Summary (Last 24 hours) at 12/21/2023 0842 Last data filed at 12/21/2023 0700 Gross per 24 hour  Intake 820 ml  Output 1550 ml  Net -730 ml   Filed Weights   12/18/23 1557 12/19/23 0432 12/21/23 0509  Weight: 82.3 kg 78.3 kg 76.2 kg    Telemetry    Sinu rhythm - Personally Reviewed  ECG     None today- Personally Reviewed  Physical Exam     General: Comfortable Head: Atraumatic, normal size  Eyes: PEERLA, EOMI  Neck: Supple, normal JVD Cardiac: Normal S1, S2; RRR; no murmurs, rubs, or gallops Lungs: Clear to auscultation bilaterally Abd: Soft, nontender, no hepatomegaly  Ext: warm, no edema Musculoskeletal: No deformities, BUE and BLE strength normal  and equal Skin: Warm and dry, no rashes   Neuro: Alert and oriented to person, place, time, and situation, CNII-XII grossly intact, no focal deficits  Psych: Normal mood and affect   Labs    Chemistry Recent Labs  Lab 12/17/23 2128 12/18/23 0455 12/19/23 0237 12/20/23 0241 12/21/23 0206  NA 138 139 140 138 138  K 4.4 3.7 3.7 3.8 4.1  CL 112* 112* 108 108 106  CO2 21* 21* 22 20* 23  GLUCOSE 133* 109* 103* 107* 112*  BUN 27* 24* 22 23 25*  CREATININE 1.46* 1.48* 1.56* 1.53* 1.83*  CALCIUM  8.4* 8.2* 8.4* 8.3* 8.2*  PROT 5.8* 5.9*  --   --   --   ALBUMIN 2.5* 2.5*  --   --   --   AST 63* 49*  --   --   --   ALT 18 20  --   --   --   ALKPHOS 84 74  --   --   --   BILITOT 0.6 1.1  --   --   --   GFRNONAA 46* 45* 42* 43* 35*  ANIONGAP 5 6 10 10 9      Hematology Recent Labs  Lab 12/19/23 0237 12/20/23 0241 12/21/23 0206  WBC 6.7 6.1  5.2  RBC 2.23* 2.73* 2.87*  HGB 7.5* 8.9* 9.2*  HCT 23.4* 28.2* 29.9*  MCV 104.9* 103.3* 104.2*  MCH 33.6 32.6 32.1  MCHC 32.1 31.6 30.8  RDW 15.9* 17.5* 16.4*  PLT 224 192 169    Cardiac EnzymesNo results for input(s): "TROPONINI" in the last 168 hours. No results for input(s): "TROPIPOC" in the last 168 hours.   BNP Recent Labs  Lab 12/17/23 2128  BNP 252.6*     DDimer No results for input(s): "DDIMER" in the last 168 hours.   Radiology    DG CHEST PORT 1 VIEW Result Date: 12/20/2023 CLINICAL DATA:  Fever. EXAM: PORTABLE CHEST 1 VIEW COMPARISON:  12/17/2023. FINDINGS: There is increasing left basilar opacity, favoring combination of left pleural effusion and left lung atelectasis with or without superimposed pneumonitis/consolidation. Correlate clinically. There is diffuse mild-to-moderate pulmonary vascular congestion, similar to the prior study. Bilateral lung fields are otherwise clear. Right lateral costophrenic angle is clear. Stable cardio-mediastinal silhouette. There are surgical staples along the heart border and  sternotomy wires, status post CABG (coronary artery bypass graft). No acute osseous abnormalities. The soft tissues are within normal limits. IMPRESSION: *Increasing left basilar opacity, favoring combination of left pleural effusion and left lung atelectasis with or without superimposed pneumonitis/consolidation. Correlate clinically. *Mild-to-moderate pulmonary vascular congestion. Correlate clinically for pulmonary edema. Electronically Signed   By: Beula Brunswick M.D.   On: 12/20/2023 11:36    Cardiac Studies   Echo  and Zio  Patient Profile     89 y.o. male with a hx of CAD s/p CABG 2000, s/p mechanical aortic valve replacement 2000 on chronic Coumadin  therapy, persistent A-fib/flutter s/p cardioversion 2019 and ablation 05/2019, hypertension, aortic aneurysm, hypothyroidism, BPH, recurrent GI bleed, CKD stage III AA, cryptogenic cirrhosis, GERD.  Assessment & Plan    Acute on chronic diastolic heart failure with shortness of breath.  He has been actively diuresed while in the hospital.  With change in weight on admission 82.3 kg now 76.2 kg.  Net -3.5 L. Status post IV Lasix  yesterday.  Unfortunately his creatinine is trending upward: Creatinine is 1.83 today which was 1.53 yesterday.  It appears that his baseline is 1.4 in the setting of chronic kidney disease.  I would prefer to double his Lasix  dose today and watch/monitor his creatinine if this starts to trend downwards we can restart his Lasix  I do not see anything transition to p.o.  Acute blood loss anemia in the setting of recurrent GI bleeding thankfully his hemoglobin has improved to 9.2 today.  He has been cleared by GI to start his warfarin today we will go ahead and resume his Coumadin  pharmacy is assisting with this.  His hemoglobin is going.  Atrial fibrillation-he is in sinus rhythm this morning.  Continue Cardizem  anticoagulation to be restarted as noted above  History of aortic valve repair stable on recent echo.  CKD  stage III.-Creatinine is 1.83 today well above his baseline we will monitor.  Will hold Lasix  today hopefully tomorrowstarts to trend down and we can resume his diuretics.  Plan discussed with the patient and his wife at the bedside.  No questions at this time        For questions or updates, please contact [facility] Please consult [email or web address] for contact info under Cardiology/STEMI.      Signed, Ariele Vidrio, DO  12/21/2023, 8:42 AM

## 2023-12-21 NOTE — Hospital Course (Addendum)
 Mr. Jason Santana was admitted to the hospital with the working diagnosis of acute blood loss anemia due to upper GI bleed.   89 y.o. male with medical history significant of CAD, A-fib/flutter and mechanical aortic valve replacement on Coumadin , PVCs, CKD stage IIIa, hypertension, cryptogenic cirrhosis, GERD, hypothyroidism presenting with complaints of shortness of breath and bilateral lower extremity edema.   Patient states his doctor had reduced the dose of his home Lasix  from 40 mg daily to 20 mg daily a month ago.  Since then he is having progressively worsening dyspnea on exertion and bilateral lower extremity edema. Also with increasing melena outpatient basis.  Came to ED for the same.  Found to have Hgb 6.9 and given 1 unit PRBC   04/20 volume status has improved, no signs of bleeding, and he has been transitioned to oral antibiotic therapy.  Possible discharge home tomorrow.

## 2023-12-21 NOTE — Assessment & Plan Note (Signed)
 No signs of acute decompensation Continue close follow up.

## 2023-12-21 NOTE — Assessment & Plan Note (Signed)
 Continue blood pressure monitoring.  Continue with diltiazem  for atrial fibrillation

## 2023-12-21 NOTE — Progress Notes (Signed)
 [facility] ROUNDING NOTE   Subjective: Feels much better, denies any rectal bleeding or dark stool. No abdominal pain   Objective: Vital signs in last 24 hours: Temp:  [97.3 F (36.3 C)-102 F (38.9 C)] 98.4 F (36.9 C) (04/19 0751) Pulse Rate:  [75-92] 75 (04/19 0509) Resp:  [12-25] 20 (04/19 0509) BP: (94-140)/(50-57) 119/53 (04/19 0751) SpO2:  [87 %-98 %] 91 % (04/19 0509) Weight:  [76.2 kg] 76.2 kg (04/19 0509) Last BM Date : 12/19/23 General: NAD Abdomen: soft, non tender and no distension     Intake/Output from previous day: 04/18 0701 - 04/19 0700 In: 820 [P.O.:320; I.V.:150; IV Piggyback:350] Out: 1550 [Urine:1550] Intake/Output this shift: No intake/output data recorded.   Lab Results: Recent Labs    12/19/23 0237 12/20/23 0241 12/21/23 0206  WBC 6.7 6.1 5.2  HGB 7.5* 8.9* 9.2*  PLT 224 192 169  MCV 104.9* 103.3* 104.2*   BMET Recent Labs    12/19/23 0237 12/20/23 0241 12/21/23 0206  NA 140 138 138  K 3.7 3.8 4.1  CL 108 108 106  CO2 22 20* 23  GLUCOSE 103* 107* 112*  BUN 22 23 25*  CREATININE 1.56* 1.53* 1.83*  CALCIUM  8.4* 8.3* 8.2*   LFT No results for input(s): "PROT", "ALBUMIN", "AST", "ALT", "ALKPHOS", "BILITOT", "BILIDIR", "IBILI" in the last 72 hours. PT/INR Recent Labs    12/19/23 1441 12/20/23 0241  INR 2.9* 2.5*      Imaging/Other results: DG CHEST PORT 1 VIEW Result Date: 12/20/2023 CLINICAL DATA:  Fever. EXAM: PORTABLE CHEST 1 VIEW COMPARISON:  12/17/2023. FINDINGS: There is increasing left basilar opacity, favoring combination of left pleural effusion and left lung atelectasis with or without superimposed pneumonitis/consolidation. Correlate clinically. There is diffuse mild-to-moderate pulmonary vascular congestion, similar to the prior study. Bilateral lung fields are otherwise clear. Right lateral costophrenic angle is clear. Stable cardio-mediastinal silhouette. There are surgical staples along the  heart border and sternotomy wires, status post CABG (coronary artery bypass graft). No acute osseous abnormalities. The soft tissues are within normal limits. IMPRESSION: *Increasing left basilar opacity, favoring combination of left pleural effusion and left lung atelectasis with or without superimposed pneumonitis/consolidation. Correlate clinically. *Mild-to-moderate pulmonary vascular congestion. Correlate clinically for pulmonary edema. Electronically Signed   By: Beula Brunswick M.D.   On: 12/20/2023 11:36      Assessment &Plan  89 year old very pleasant gentleman with cryptogenic cirrhosis, A-fib, mechanical AVR on chronic Coumadin  admitted with FOBT positive symptomatic anemia S/p EGD yesterday with treatment of gastric antral vascular ectasia  Hemoglobin improved to 9.2 this morning status post PRBC transfusion 2 units yesterday No signs of ongoing bleeding on heparin  gtt. Okay to resume Coumadin  Advance to soft diet, advised patient to stay on soft diet for 1 week and then can resume previous regular diet Continue PPI twice daily Repeat CBC as outpatient in 1 week through Coumadin  clinic or PMD IV iron  infusion during the hospitalization for iron  deficiency Continue oral iron  daily  Follow-up with GI as needed, inpatient GI signing off, available if have any questions.    Lorena Rolling , MD [phone]  [facility]

## 2023-12-21 NOTE — Plan of Care (Signed)

## 2023-12-22 DIAGNOSIS — I10 Essential (primary) hypertension: Secondary | ICD-10-CM | POA: Diagnosis not present

## 2023-12-22 DIAGNOSIS — N189 Chronic kidney disease, unspecified: Secondary | ICD-10-CM

## 2023-12-22 DIAGNOSIS — K922 Gastrointestinal hemorrhage, unspecified: Secondary | ICD-10-CM | POA: Diagnosis not present

## 2023-12-22 DIAGNOSIS — B954 Other streptococcus as the cause of diseases classified elsewhere: Secondary | ICD-10-CM | POA: Diagnosis not present

## 2023-12-22 DIAGNOSIS — I480 Paroxysmal atrial fibrillation: Secondary | ICD-10-CM | POA: Diagnosis not present

## 2023-12-22 DIAGNOSIS — K7469 Other cirrhosis of liver: Secondary | ICD-10-CM | POA: Diagnosis not present

## 2023-12-22 DIAGNOSIS — Z952 Presence of prosthetic heart valve: Secondary | ICD-10-CM

## 2023-12-22 DIAGNOSIS — I5033 Acute on chronic diastolic (congestive) heart failure: Secondary | ICD-10-CM | POA: Diagnosis not present

## 2023-12-22 DIAGNOSIS — D62 Acute posthemorrhagic anemia: Secondary | ICD-10-CM | POA: Diagnosis not present

## 2023-12-22 DIAGNOSIS — I4892 Unspecified atrial flutter: Secondary | ICD-10-CM | POA: Diagnosis not present

## 2023-12-22 DIAGNOSIS — I25700 Atherosclerosis of coronary artery bypass graft(s), unspecified, with unstable angina pectoris: Secondary | ICD-10-CM | POA: Diagnosis not present

## 2023-12-22 DIAGNOSIS — I5023 Acute on chronic systolic (congestive) heart failure: Secondary | ICD-10-CM | POA: Diagnosis not present

## 2023-12-22 LAB — HEPARIN LEVEL (UNFRACTIONATED): Heparin Unfractionated: 0.46 [IU]/mL (ref 0.30–0.70)

## 2023-12-22 LAB — BASIC METABOLIC PANEL WITH GFR
Anion gap: 6 (ref 5–15)
BUN: 26 mg/dL — ABNORMAL HIGH (ref 8–23)
CO2: 23 mmol/L (ref 22–32)
Calcium: 8 mg/dL — ABNORMAL LOW (ref 8.9–10.3)
Chloride: 108 mmol/L (ref 98–111)
Creatinine, Ser: 1.7 mg/dL — ABNORMAL HIGH (ref 0.61–1.24)
GFR, Estimated: 38 mL/min — ABNORMAL LOW (ref >60)
Glucose, Bld: 125 mg/dL — ABNORMAL HIGH (ref 70–99)
Potassium: 4.1 mmol/L (ref 3.5–5.1)
Sodium: 137 mmol/L (ref 135–145)

## 2023-12-22 LAB — CBC WITH DIFFERENTIAL/PLATELET
Abs Immature Granulocytes: 0.02 10*3/uL (ref 0.00–0.07)
Basophils Absolute: 0 10*3/uL (ref 0.0–0.1)
Basophils Relative: 0 %
Eosinophils Absolute: 0.3 10*3/uL (ref 0.0–0.5)
Eosinophils Relative: 6 %
HCT: 28.6 % — ABNORMAL LOW (ref 39.0–52.0)
Hemoglobin: 8.9 g/dL — ABNORMAL LOW (ref 13.0–17.0)
Immature Granulocytes: 0 %
Lymphocytes Relative: 16 %
Lymphs Abs: 0.8 10*3/uL (ref 0.7–4.0)
MCH: 32.7 pg (ref 26.0–34.0)
MCHC: 31.1 g/dL (ref 30.0–36.0)
MCV: 105.1 fL — ABNORMAL HIGH (ref 80.0–100.0)
Monocytes Absolute: 0.7 10*3/uL (ref 0.1–1.0)
Monocytes Relative: 14 %
Neutro Abs: 3.1 10*3/uL (ref 1.7–7.7)
Neutrophils Relative %: 64 %
Platelets: 162 10*3/uL (ref 150–400)
RBC: 2.72 MIL/uL — ABNORMAL LOW (ref 4.22–5.81)
RDW: 15.5 % (ref 11.5–15.5)
WBC: 4.8 10*3/uL (ref 4.0–10.5)
nRBC: 0 % (ref 0.0–0.2)

## 2023-12-22 LAB — PROTIME-INR
INR: 1.7 — ABNORMAL HIGH (ref 0.8–1.2)
Prothrombin Time: 20.3 s — ABNORMAL HIGH (ref 11.4–15.2)

## 2023-12-22 MED ORDER — PANTOPRAZOLE SODIUM 40 MG PO TBEC
40.0000 mg | DELAYED_RELEASE_TABLET | Freq: Two times a day (BID) | ORAL | Status: DC
  Administered 2023-12-22 – 2023-12-28 (×12): 40 mg via ORAL
  Filled 2023-12-22 (×12): qty 1

## 2023-12-22 MED ORDER — WARFARIN SODIUM 5 MG PO TABS
10.0000 mg | ORAL_TABLET | Freq: Once | ORAL | Status: AC
  Administered 2023-12-22: 10 mg via ORAL
  Filled 2023-12-22: qty 2

## 2023-12-22 MED ORDER — SODIUM CHLORIDE 0.9 % IV SOLN
2.0000 g | INTRAVENOUS | Status: DC
  Administered 2023-12-22 – 2023-12-24 (×3): 2 g via INTRAVENOUS
  Filled 2023-12-22 (×3): qty 20

## 2023-12-22 MED ORDER — HEPARIN (PORCINE) 25000 UT/250ML-% IV SOLN
1000.0000 [IU]/h | INTRAVENOUS | Status: AC
  Administered 2023-12-22 – 2023-12-23 (×2): 1300 [IU]/h via INTRAVENOUS
  Administered 2023-12-24: 1000 [IU]/h via INTRAVENOUS
  Filled 2023-12-22 (×3): qty 250

## 2023-12-22 MED ORDER — IPRATROPIUM-ALBUTEROL 0.5-2.5 (3) MG/3ML IN SOLN
3.0000 mL | Freq: Four times a day (QID) | RESPIRATORY_TRACT | Status: DC | PRN
  Administered 2023-12-22 – 2023-12-24 (×3): 3 mL via RESPIRATORY_TRACT
  Filled 2023-12-22 (×3): qty 3

## 2023-12-22 NOTE — Progress Notes (Signed)
 Pharmacy Consult for Warfarin w/ heparin  gtt bridge Indication: atrial fibrillation and mAVR  Allergies  Allergen Reactions   Carvedilol  Shortness Of Breath    wheezing   Lopid [Gemfibrozil] Other (See Comments)    transaminitis   Monopril [Fosinopril] Other (See Comments)    transaminitis   Vicodin [Hydrocodone-Acetaminophen ]     Severe sensitivity   Phenergan [Promethazine Hcl] Other (See Comments)    Severe somnolence.   Zocor [Simvastatin]     Short memory loss.   Benicar [Olmesartan]     Abdominal cramping and increased stools/ irritable bowel   Codeine Nausea And Vomiting   Prilosec [Omeprazole]     dyspepsia    Patient Measurements: Height: 5\' 10"  (177.8 cm) Weight: 79.1 kg (174 lb 4.8 oz) IBW/kg (Calculated) : 73 HEPARIN  DW (KG): 82.3  Vital Signs: Temp: 98.6 F (37 C) (04/20 1541) Temp Source: Oral (04/20 1541) BP: 128/61 (04/20 1541) Pulse Rate: 83 (04/20 1541)  Labs: Recent Labs    12/20/23 0241 12/21/23 0206 12/21/23 0929 12/22/23 0232 12/22/23 1732  HGB 8.9* 9.2*  --  8.9*  --   HCT 28.2* 29.9*  --  28.6*  --   PLT 192 169  --  162  --   LABPROT 27.6*  --  21.9* 20.3*  --   INR 2.5*  --  1.9* 1.7*  --   HEPARINUNFRC  --   --   --   --  0.46  CREATININE 1.53* 1.83*  --  1.70*  --     Estimated Creatinine Clearance: 30.4 mL/min (A) (by C-G formula based on SCr of 1.7 mg/dL (H)).  Assessment: Jason Santana a 89 y.o. male presented with GIB, now s/p EGD 4/18. In EGD op note, GI states it is ok to resume warfarin on 4/19, confirmed OK to restart with TRH. OF note, patient is currently on azithromycin  for possible pneumonia. Given this DDI, which would likely lead to increased INR, recent GIB with diminished PO intake, and INR on last check 2.5 (4/18), will initiate warfarin at lower than PTA dosing and monitor closely.   Anticoagulation PTA: Warfarin PTA dosing: 5 mg M/F, 7.5 mg all other days   4/20 AM: INR with slight dip out of range to 1.7 s/p 5  mg dose x1. In context of mAVR, will initiate a heparin  bridge. Hgb remains stable at ~9, PLT mid-high 100s. RN reports patient is eating ~50-80% of his meals.   4/21 PM update: HL: 0.46 No signs of bleeding or issues w/ gtt  Goal of Therapy:  INR 2-3 Heparin  level 0.3-0.7 units/ml Monitor platelets by anticoagulation protocol: Yes   Plan:  Continue heparin  infusion at 1,300 U/h  Daily HL/INR/CBC  Monitor for signs of bleeding / issues with heparin  gtt and report to Pharmacist on the unit  Marleta Simmer BS, PharmD, BCPS Clinical Pharmacist 12/22/2023 7:12 PM  Contact: [phone] after 3 PM  "Be curious, not judgmental..." -Rumalda Counter

## 2023-12-22 NOTE — Progress Notes (Signed)
 Pharmacy Consult for Warfarin Indication: atrial fibrillation and mAVR  Allergies  Allergen Reactions   Carvedilol  Shortness Of Breath    wheezing   Lopid [Gemfibrozil] Other (See Comments)    transaminitis   Monopril [Fosinopril] Other (See Comments)    transaminitis   Vicodin [Hydrocodone-Acetaminophen ]     Severe sensitivity   Phenergan [Promethazine Hcl] Other (See Comments)    Severe somnolence.   Zocor [Simvastatin]     Short memory loss.   Benicar [Olmesartan]     Abdominal cramping and increased stools/ irritable bowel   Codeine Nausea And Vomiting   Prilosec [Omeprazole]     dyspepsia    Patient Measurements: Height: 5\' 10"  (177.8 cm) Weight: 76.2 kg (167 lb 15.9 oz) IBW/kg (Calculated) : 73 HEPARIN  DW (KG): 82.3  Vital Signs: Temp: 98.1 F (36.7 C) (04/20 0743) Temp Source: Oral (04/20 0743) BP: 134/66 (04/20 0743) Pulse Rate: 70 (04/20 0743)  Labs: Recent Labs    12/20/23 0241 12/21/23 0206 12/21/23 0929 12/22/23 0232  HGB 8.9* 9.2*  --  8.9*  HCT 28.2* 29.9*  --  28.6*  PLT 192 169  --  162  LABPROT 27.6*  --  21.9* 20.3*  INR 2.5*  --  1.9* 1.7*  CREATININE 1.53* 1.83*  --  1.70*    Estimated Creatinine Clearance: 30.4 mL/min (A) (by C-G formula based on SCr of 1.7 mg/dL (H)).  Assessment: AMI THORNSBERRY a 89 y.o. male presented with GIB, now s/p EGD 4/18. In EGD op note, GI states it is ok to resume warfarin on 4/19, confirmed OK to restart with TRH. OF note, patient is currently on azithromycin  for possible pneumonia. Given this DDI, which would likely lead to increased INR, recent GIB with diminished PO intake, and INR on last check 2.5 (4/18), will initiate warfarin at lower than PTA dosing and monitor closely.   Anticoagulation PTA: Warfarin PTA dosing: 5 mg M/F, 7.5 mg all other days   4/20 AM: INR with slight dip out of range to 1.7 s/p 5 mg dose x1. In context of mAVR, will initiate a heparin  bridge. Hgb remains stable at ~9, PLT mid-high  100s. RN reports patient is eating ~50-80% of his meals.   Goal of Therapy:  INR 2-3 Monitor platelets by anticoagulation protocol: Yes   Plan:  START heparin  infusion at 1,300 U/h  F/U 8h anti-xa  Daily INR/CBC  Warfarin 10 mg PO x1   Chrystie Crass, PharmD Clinical Pharmacist  12/22/2023 9:25 AM

## 2023-12-22 NOTE — Progress Notes (Signed)
 Progress Note  Patient Name: Jason Santana Date of Encounter: 12/22/2023  Primary Cardiologist: Dorothye Gathers, MD   Subjective   Patient seen and examined by his bedside.     Inpatient Medications    Scheduled Meds:  cephALEXin   500 mg Oral Q12H   dextromethorphan -guaiFENesin   2 tablet Oral BID   diltiazem   120 mg Oral Daily   levothyroxine   125 mcg Oral QAC breakfast   pantoprazole  (PROTONIX ) IV  40 mg Intravenous Q12H   Warfarin - Pharmacist Dosing Inpatient   Does not apply q1600   Continuous Infusions:  heparin      PRN Meds: acetaminophen , loratadine , ondansetron  (ZOFRAN ) IV   Vital Signs    Vitals:   12/21/23 1934 12/21/23 2335 12/22/23 0351 12/22/23 0743  BP: (!) 124/56 (!) 115/51 (!) 105/49 134/66  Pulse: 74 72 69 70  Resp: 19 19 16 20   Temp: 98.2 F (36.8 C) 98 F (36.7 C) 98.1 F (36.7 C) 98.1 F (36.7 C)  TempSrc: Oral Oral Oral Oral  SpO2: 94% 92% 93% 94%  Weight:      Height:        Intake/Output Summary (Last 24 hours) at 12/22/2023 0838 Last data filed at 12/22/2023 0600 Gross per 24 hour  Intake 519.79 ml  Output 1050 ml  Net -530.21 ml   Filed Weights   12/18/23 1557 12/19/23 0432 12/21/23 0509  Weight: 82.3 kg 78.3 kg 76.2 kg    Telemetry    Sinu rhythm - Personally Reviewed  ECG     None today- Personally Reviewed  Physical Exam     General: Comfortable Head: Atraumatic, normal size  Eyes: PEERLA, EOMI  Neck: Supple, normal JVD Cardiac: Normal S1, S2; RRR; no murmurs, rubs, or gallops Lungs: Clear to auscultation bilaterally Abd: Soft, nontender, no hepatomegaly  Ext: warm, no edema Musculoskeletal: No deformities, BUE and BLE strength normal and equal Skin: Warm and dry, no rashes   Neuro: Alert and oriented to person, place, time, and situation, CNII-XII grossly intact, no focal deficits  Psych: Normal mood and affect   Labs    Chemistry Recent Labs  Lab 12/17/23 2128 12/18/23 0455 12/19/23 0237 12/20/23 0241  12/21/23 0206 12/22/23 0232  NA 138 139   < > 138 138 137  K 4.4 3.7   < > 3.8 4.1 4.1  CL 112* 112*   < > 108 106 108  CO2 21* 21*   < > 20* 23 23  GLUCOSE 133* 109*   < > 107* 112* 125*  BUN 27* 24*   < > 23 25* 26*  CREATININE 1.46* 1.48*   < > 1.53* 1.83* 1.70*  CALCIUM  8.4* 8.2*   < > 8.3* 8.2* 8.0*  PROT 5.8* 5.9*  --   --   --   --   ALBUMIN 2.5* 2.5*  --   --   --   --   AST 63* 49*  --   --   --   --   ALT 18 20  --   --   --   --   ALKPHOS 84 74  --   --   --   --   BILITOT 0.6 1.1  --   --   --   --   GFRNONAA 46* 45*   < > 43* 35* 38*  ANIONGAP 5 6   < > 10 9 6    < > = values in this interval not displayed.  Hematology Recent Labs  Lab 12/20/23 0241 12/21/23 0206 12/22/23 0232  WBC 6.1 5.2 4.8  RBC 2.73* 2.87* 2.72*  HGB 8.9* 9.2* 8.9*  HCT 28.2* 29.9* 28.6*  MCV 103.3* 104.2* 105.1*  MCH 32.6 32.1 32.7  MCHC 31.6 30.8 31.1  RDW 17.5* 16.4* 15.5  PLT 192 169 162    Cardiac EnzymesNo results for input(s): "TROPONINI" in the last 168 hours. No results for input(s): "TROPIPOC" in the last 168 hours.   BNP Recent Labs  Lab 12/17/23 2128  BNP 252.6*     DDimer No results for input(s): "DDIMER" in the last 168 hours.   Radiology    DG Chest 1 View Result Date: 12/21/2023 CLINICAL DATA:  Follow-up exam. EXAM: CHEST  1 VIEW COMPARISON:  12/20/2023. FINDINGS: The heart size and mediastinal contours are stable. There is atherosclerotic calcification of the aorta. A hazy opacity is noted at the left lung base and there is a small left pleural effusion. The right lung is clear. No pneumothorax bilaterally. Sternotomy wires and a prosthetic cardiac valve are noted. IMPRESSION: Small left pleural effusion with atelectasis or infiltrate at the left lung base, not significantly changed from the prior exam. Electronically Signed   By: Wyvonnia Heimlich M.D.   On: 12/21/2023 18:03   DG CHEST PORT 1 VIEW Result Date: 12/20/2023 CLINICAL DATA:  Fever. EXAM: PORTABLE  CHEST 1 VIEW COMPARISON:  12/17/2023. FINDINGS: There is increasing left basilar opacity, favoring combination of left pleural effusion and left lung atelectasis with or without superimposed pneumonitis/consolidation. Correlate clinically. There is diffuse mild-to-moderate pulmonary vascular congestion, similar to the prior study. Bilateral lung fields are otherwise clear. Right lateral costophrenic angle is clear. Stable cardio-mediastinal silhouette. There are surgical staples along the heart border and sternotomy wires, status post CABG (coronary artery bypass graft). No acute osseous abnormalities. The soft tissues are within normal limits. IMPRESSION: *Increasing left basilar opacity, favoring combination of left pleural effusion and left lung atelectasis with or without superimposed pneumonitis/consolidation. Correlate clinically. *Mild-to-moderate pulmonary vascular congestion. Correlate clinically for pulmonary edema. Electronically Signed   By: Beula Brunswick M.D.   On: 12/20/2023 11:36    Cardiac Studies   Echo  and Zio  Patient Profile     89 y.o. male with a hx of CAD s/p CABG 2000, s/p mechanical aortic valve replacement 2000 on chronic Coumadin  therapy, persistent A-fib/flutter s/p cardioversion 2019 and ablation 05/2019, hypertension, aortic aneurysm, hypothyroidism, BPH, recurrent GI bleed, CKD stage III AA, cryptogenic cirrhosis, GERD.  Assessment & Plan    Acute on chronic diastolic heart failure with shortness of breath.  He has been actively diuresed while in the hospital.  With change in weight on admission 82.3 kg now 76.2 kg.  Net -4.1L. Cr trending down but I do think we should wait another day before giving another dose of IV Lasix .   Unfortunately his creatinine is trending upward: Creatinine was1.83 yesterday today 1.70 today.  It appears that his baseline is 1.4 in the setting of chronic kidney disease.   Acute blood loss anemia in the setting of recurrent GI bleeding. Hgb  8.9 today - yesterday 9. Continue to monitor. Coumadin  restarted. INR 1.7.   Atrial fibrillation-he is in sinus rhythm this morning.  Continue Cardizem  anticoagulation to be restarted as noted above. Target INR 2-3  History of aortic valve repair stable on recent echo.  CKD stage III.-Creatinine is 1.70 today well above his baseline we will monitor.  Will hold Lasix  today hopefully tomorrowstarts  to trend down and we can resume his diuretics.  Plan discussed with the patient.  No questions at this time.        For questions or updates, please contact [facility] Please consult [email or web address] for contact info under Cardiology/STEMI.      Signed, Garrett Mitchum, DO  12/22/2023, 8:38 AM

## 2023-12-22 NOTE — Progress Notes (Incomplete)
 Pharmacy Consult for Warfarin Indication: atrial fibrillation and mAVR  Allergies  Allergen Reactions   Carvedilol  Shortness Of Breath    wheezing   Lopid [Gemfibrozil] Other (See Comments)    transaminitis   Monopril [Fosinopril] Other (See Comments)    transaminitis   Vicodin [Hydrocodone-Acetaminophen ]     Severe sensitivity   Phenergan [Promethazine Hcl] Other (See Comments)    Severe somnolence.   Zocor [Simvastatin]     Short memory loss.   Benicar [Olmesartan]     Abdominal cramping and increased stools/ irritable bowel   Codeine Nausea And Vomiting   Prilosec [Omeprazole]     dyspepsia    Patient Measurements: Height: 5\' 10"  (177.8 cm) Weight: 76.2 kg (167 lb 15.9 oz) IBW/kg (Calculated) : 73 HEPARIN  DW (KG): 82.3  Vital Signs: Temp: 98.1 F (36.7 C) (04/20 0351) Temp Source: Oral (04/20 0351) BP: 105/49 (04/20 0351) Pulse Rate: 69 (04/20 0351)  Labs: Recent Labs    12/20/23 0241 12/21/23 0206 12/21/23 0929 12/22/23 0232  HGB 8.9* 9.2*  --  8.9*  HCT 28.2* 29.9*  --  28.6*  PLT 192 169  --  162  LABPROT 27.6*  --  21.9* 20.3*  INR 2.5*  --  1.9* 1.7*  CREATININE 1.53* 1.83*  --  1.70*    Estimated Creatinine Clearance: 30.4 mL/min (A) (by C-G formula based on SCr of 1.7 mg/dL (H)).  Assessment: Jason Santana a 89 y.o. male presented with GIB, now s/p EGD 4/18. In EGD op note, GI states it is ok to resume warfarin on 4/19, confirmed OK to restart with TRH. OF note, patient is currently on azithromycin  for possible pneumonia. Given this DDI, which would likely lead to increased INR, recent GIB with diminished PO intake, and INR on last check 2.5 (4/18), will initiate warfarin at lower than PTA dosing and monitor closely.   Notable DDI's: s/p azithromycin  x2d (increase warfarin), kelfex (increase warfarin) Anticoagulation PTA: Warfarin PTA dosing: 5 mg M/F, 7.5 mg all other days   Goal of Therapy:  INR 2-3 Monitor platelets by anticoagulation  protocol: Yes   Plan:  Warfarin 1 mg PO x1  CBC/INR daily    ADDENDUM:  INR on recheck today just below goal of 2 with INR 1.9. Will increase today's dose to 5 mg.   Jason Santana, PharmD Clinical Pharmacist  12/22/2023 7:10 AM

## 2023-12-22 NOTE — Plan of Care (Signed)

## 2023-12-22 NOTE — Consult Note (Signed)
 Date of Admission:  12/17/2023          Reason for Consult: + blood culture for streptococcus mitis/oralis in patient with fever and prosthetic aortic valve   Referring Provider: Rudie Cory, MD   Assessment:  + Blood culture for streptococcus mitis/oralis in 1/2 sites = contaminant vs evidence of possible infection of native or prosthetic AVR GIB sp EGD  CHF Cryptogenic cirrhosis Chronic kidney disease   Plan:  Placed back on ceftriaxone  2D echocardiogram Consider TEE Consider various approaches such as taking him off of antibiotics and observing him vs giving him an empiric course of ceftriaxone -though I would get TTE first  Dr. Levern Reader will take over the service tomorrow.  Principal Problem:   Acute on chronic diastolic CHF (congestive heart failure) (HCC) Active Problems:   Paroxysmal atrial flutter (HCC)   Essential hypertension   CAD (coronary artery disease) of artery bypass graft   Hypothyroidism   Acute upper GI bleed   Stage 3a chronic kidney disease (HCC)   Cirrhosis of liver without ascites (HCC)   Left lower lobe pneumonia   Scheduled Meds:  dextromethorphan -guaiFENesin   2 tablet Oral BID   diltiazem   120 mg Oral Daily   levothyroxine   125 mcg Oral QAC breakfast   pantoprazole   40 mg Oral BID   warfarin  10 mg Oral ONCE-1600   Warfarin - Pharmacist Dosing Inpatient   Does not apply q1600   Continuous Infusions:  cefTRIAXone  (ROCEPHIN )  IV 200 mL/hr at 12/22/23 1600   heparin  1,300 Units/hr (12/22/23 1600)   PRN Meds:.acetaminophen , ipratropium-albuterol , loratadine , ondansetron  (ZOFRAN ) IV  HPI: Jason Santana is a 89 y.o. male artery disease aortic valve replacement with Saint Jude AVR, atrial fibrillation, chronic kidney disease on Coumadin  with history of cryptogenic cirrhosis to the hospital several thought problems including acute on chronic diastolic heart failure.  He also was anemic with acute blood loss in the setting of recurrent  GI bleeding.  Patient has been successfully diuresed with improvement in his shortness of breath.  He has been transfused blood with improvement in his hemoglobin up to 9.  He underwent GD which showed mild gastric antral vascular ectasia with bleeding present.  Coagulation using argon plasma was successful.  The patient was placed on twice daily proton pump inhibitor.  He did become febrile on 17th and 18th  and blood cultures were taken.  He was started on ceftriaxone  azithromycin  for possible pneumonia blood cultures have come back +1 of 2 sites positive for Streptococcus mitis oralis a [employer] streptococcal species.  I am skeptical that he has pneumonia as cause of fever and feel his pulmonary traits are more likely due to to pulmonary edema.  He did have several blood transfusions and his fevers could also have been related to blood transfusion.  This could be a contaminant certainly but given the high fever and the fact that he has a prosthetic valve I have a higher degree of anxiety that could be true.  For now we will place him back on ceftriaxone  and obtain a 2D echocardiogram and consider TEE while weighing risks and benefits.  I have personally spent 82 minutes involved in face-to-face and non-face-to-face activities for this patient on the day of the visit. Professional time spent includes the following activities: Preparing to see the patient (review of tests), Obtaining and/or reviewing separately obtained history (admission/discharge record), Performing a medically appropriate examination and/or evaluation , Ordering medications/tests/procedures, referring and communicating  with other health care professionals, Documenting clinical information in the EMR, Independently interpreting results (not separately reported), Communicating results to the patient/family/caregiver, Counseling and educating the patient/family/caregiver and Care coordination (not separately reported).    Evaluation of the patient requires complex antimicrobial therapy evaluation, counseling , isolation needs to reduce disease transmission and risk assessment and mitigation.      Review of Systems: Review of Systems  Constitutional:  Negative for chills, fever, malaise/fatigue and weight loss.  HENT:  Negative for congestion and sore throat.   Eyes:  Negative for blurred vision and photophobia.  Respiratory:  Positive for cough, shortness of breath and wheezing.   Cardiovascular:  Negative for chest pain, palpitations and leg swelling.  Gastrointestinal:  Negative for abdominal pain, blood in stool, constipation, diarrhea, heartburn, melena, nausea and vomiting.  Genitourinary:  Negative for dysuria, flank pain and hematuria.  Musculoskeletal:  Negative for back pain, falls, joint pain and myalgias.  Skin:  Negative for itching and rash.  Neurological:  Negative for dizziness, focal weakness, loss of consciousness, weakness and headaches.  Endo/Heme/Allergies:  Does not bruise/bleed easily.  Psychiatric/Behavioral:  Negative for depression and suicidal ideas. The patient does not have insomnia.     Past Medical History:  Diagnosis Date   Anticoagulation adequate 05/23/2019   Atrial fibrillation (HCC)    CAD (coronary artery disease) of artery bypass graft 2000   2000 with multiple bypasses    Chronic anticoagulation    Coumadin  therapy for mechanical aortic valve. Postoperative atrial fibrillation.    Essential hypertension    H/O mechanical aortic valve replacement 2000   St Jude AVR   Hypothyroidism    Premature ventricular contractions    S/P ablation of atrial fibrillation 05/22/19 05/23/2019   Typical atrial flutter (HCC) 05/23/2019    Social History   Tobacco Use   Smoking status: Former    Types: Cigarettes   Smokeless tobacco: Never   Tobacco comments:    Former smoker (quit 66 years ago) 05/21/23  Vaping Use   Vaping status: Never Used  Substance Use Topics    Alcohol use: No   Drug use: No    Family History  Problem Relation Age of Onset   Hypertension Mother    Heart attack Mother    Hypertension Father    Colon cancer Neg Hx    Esophageal cancer Neg Hx    Pancreatic cancer Neg Hx    Stomach cancer Neg Hx    Allergies  Allergen Reactions   Carvedilol  Shortness Of Breath    wheezing   Lopid [Gemfibrozil] Other (See Comments)    transaminitis   Monopril [Fosinopril] Other (See Comments)    transaminitis   Vicodin [Hydrocodone-Acetaminophen ]     Severe sensitivity   Phenergan [Promethazine Hcl] Other (See Comments)    Severe somnolence.   Zocor [Simvastatin]     Short memory loss.   Benicar [Olmesartan]     Abdominal cramping and increased stools/ irritable bowel   Codeine Nausea And Vomiting   Prilosec [Omeprazole]     dyspepsia    OBJECTIVE: Blood pressure 128/61, pulse 83, temperature 98.6 F (37 C), temperature source Oral, resp. rate 18, height 5\' 10"  (1.778 m), weight 79.1 kg, SpO2 93%.  Physical Exam Constitutional:      Appearance: He is well-developed.  HENT:     Head: Normocephalic and atraumatic.  Eyes:     General:        Right eye: No discharge.  Left eye: No discharge.     Conjunctiva/sclera: Conjunctivae normal.  Cardiovascular:     Rate and Rhythm: Normal rate and regular rhythm.     Heart sounds: Murmur heard.     Comments: Click of mechanical valve Pulmonary:     Effort: Pulmonary effort is normal. No respiratory distress.     Breath sounds: No wheezing.  Abdominal:     General: There is no distension.     Palpations: Abdomen is soft.  Musculoskeletal:        General: No tenderness. Normal range of motion.     Cervical back: Normal range of motion and neck supple.  Skin:    General: Skin is warm and dry.     Coloration: Skin is not pale.     Findings: No erythema or rash.  Neurological:     General: No focal deficit present.     Mental Status: He is alert and oriented to person,  place, and time.  Psychiatric:        Mood and Affect: Mood normal.        Behavior: Behavior normal.        Thought Content: Thought content normal.        Judgment: Judgment normal.     Lab Results Lab Results  Component Value Date   WBC 4.8 12/22/2023   HGB 8.9 (L) 12/22/2023   HCT 28.6 (L) 12/22/2023   MCV 105.1 (H) 12/22/2023   PLT 162 12/22/2023    Lab Results  Component Value Date   CREATININE 1.70 (H) 12/22/2023   BUN 26 (H) 12/22/2023   NA 137 12/22/2023   K 4.1 12/22/2023   CL 108 12/22/2023   CO2 23 12/22/2023    Lab Results  Component Value Date   ALT 20 12/18/2023   AST 49 (H) 12/18/2023   ALKPHOS 74 12/18/2023   BILITOT 1.1 12/18/2023     Microbiology: Recent Results (from the past 240 hours)  MRSA Next Gen by PCR, Nasal     Status: None   Collection Time: 12/18/23  4:08 PM   Specimen: Nasal Mucosa; Nasal Swab  Result Value Ref Range Status   MRSA by PCR Next Gen NOT DETECTED NOT DETECTED Final    Comment: (NOTE) The GeneXpert MRSA Assay (FDA approved for NASAL specimens only), is one component of a comprehensive MRSA colonization surveillance program. It is not intended to diagnose MRSA infection nor to guide or monitor treatment for MRSA infections. Test performance is not FDA approved in patients less than 22 years old. Performed at [facility], [address] [place], [place] [address]   Culture, blood (Routine X 2) w Reflex to ID Panel     Status: Abnormal (Preliminary result)   Collection Time: 12/20/23 11:26 AM   Specimen: BLOOD LEFT ARM  Result Value Ref Range Status   Specimen Description BLOOD LEFT ARM  Final   Special Requests   Final    BOTTLES DRAWN AEROBIC AND ANAEROBIC Blood Culture adequate volume   Culture  Setup Time   Final    GRAM POSITIVE COCCI IN CHAINS ANAEROBIC BOTTLE ONLY CRITICAL RESULT CALLED TO, READ BACK BY AND VERIFIED WITH: PHARMD J LEDFORD 12/21/2023 @ 0412 BY AB    Culture (A)  Final     STREPTOCOCCUS MITIS/ORALIS SUSCEPTIBILITIES TO FOLLOW Performed at [facility], [address], [place], [place] [address]    Report Status PENDING  Incomplete  Blood Culture ID Panel (Reflexed)  Status: Abnormal   Collection Time: 12/20/23 11:26 AM  Result Value Ref Range Status   Enterococcus faecalis NOT DETECTED NOT DETECTED Final   Enterococcus Faecium NOT DETECTED NOT DETECTED Final   Listeria monocytogenes NOT DETECTED NOT DETECTED Final   Staphylococcus species NOT DETECTED NOT DETECTED Final   Staphylococcus aureus (BCID) NOT DETECTED NOT DETECTED Final   Staphylococcus epidermidis NOT DETECTED NOT DETECTED Final   Staphylococcus lugdunensis NOT DETECTED NOT DETECTED Final   Streptococcus species DETECTED (A) NOT DETECTED Final    Comment: Not Enterococcus species, Streptococcus agalactiae, Streptococcus pyogenes, or Streptococcus pneumoniae. CRITICAL RESULT CALLED TO, READ BACK BY AND VERIFIED WITH: PHARMD J LEDFORD 12/21/2023 @ 0412 BY AB    Streptococcus agalactiae NOT DETECTED NOT DETECTED Final   Streptococcus pneumoniae NOT DETECTED NOT DETECTED Final   Streptococcus pyogenes NOT DETECTED NOT DETECTED Final   A.calcoaceticus-baumannii NOT DETECTED NOT DETECTED Final   Bacteroides fragilis NOT DETECTED NOT DETECTED Final   Enterobacterales NOT DETECTED NOT DETECTED Final   Enterobacter cloacae complex NOT DETECTED NOT DETECTED Final   Escherichia coli NOT DETECTED NOT DETECTED Final   Klebsiella aerogenes NOT DETECTED NOT DETECTED Final   Klebsiella oxytoca NOT DETECTED NOT DETECTED Final   Klebsiella pneumoniae NOT DETECTED NOT DETECTED Final   Proteus species NOT DETECTED NOT DETECTED Final   Salmonella species NOT DETECTED NOT DETECTED Final   Serratia marcescens NOT DETECTED NOT DETECTED Final   Haemophilus influenzae NOT DETECTED NOT DETECTED Final   Neisseria meningitidis NOT DETECTED NOT DETECTED Final   Pseudomonas aeruginosa NOT DETECTED NOT  DETECTED Final   Stenotrophomonas maltophilia NOT DETECTED NOT DETECTED Final   Candida albicans NOT DETECTED NOT DETECTED Final   Candida auris NOT DETECTED NOT DETECTED Final   Candida glabrata NOT DETECTED NOT DETECTED Final   Candida krusei NOT DETECTED NOT DETECTED Final   Candida parapsilosis NOT DETECTED NOT DETECTED Final   Candida tropicalis NOT DETECTED NOT DETECTED Final   Cryptococcus neoformans/gattii NOT DETECTED NOT DETECTED Final    Comment: Performed at [facility], [address], [place], [place] [address]  Culture, blood (Routine X 2) w Reflex to ID Panel     Status: None (Preliminary result)   Collection Time: 12/20/23 11:27 AM   Specimen: BLOOD RIGHT ARM  Result Value Ref Range Status   Specimen Description BLOOD RIGHT ARM  Final   Special Requests   Final    BOTTLES DRAWN AEROBIC AND ANAEROBIC Blood Culture adequate volume   Culture   Final    NO GROWTH 2 DAYS Performed at [facility], [address] [place], [place] [address]    Report Status PENDING  Incomplete  Culture, blood (Routine X 2) w Reflex to ID Panel     Status: None (Preliminary result)   Collection Time: 12/22/23  2:32 AM   Specimen: BLOOD RIGHT HAND  Result Value Ref Range Status   Specimen Description BLOOD RIGHT HAND  Final   Special Requests   Final    BOTTLES DRAWN AEROBIC AND ANAEROBIC Blood Culture adequate volume   Culture   Final    NO GROWTH < 12 HOURS Performed at [facility], [address], [place], [place] [address]    Report Status PENDING  Incomplete  Culture, blood (Routine X 2) w Reflex to ID Panel     Status: None (Preliminary result)   Collection Time: 12/22/23  2:44 AM   Specimen: BLOOD LEFT ARM  Result  Value Ref Range Status   Specimen Description BLOOD LEFT ARM  Final   Special Requests   Final    BOTTLES DRAWN AEROBIC AND ANAEROBIC Blood Culture adequate volume   Culture   Final    NO GROWTH < 12 HOURS Performed at [facility] [place] [facility],  [address], [place], [place] [address]    Report Status PENDING  Incomplete    Sheela Denmark, MD [facility] [phone] pager  12/22/2023, 5:16 PM

## 2023-12-22 NOTE — Progress Notes (Signed)
 Progress Note   Patient: Jason Santana:[id number] DOB: 1932/07/14 DOA: 12/17/2023     5 DOS: the patient was seen and examined on 12/22/2023   Brief hospital course: Jason Santana was admitted to the hospital with the working diagnosis of acute blood loss anemia due to upper GI bleed.   89 y.o. male with medical history significant of CAD, A-fib/flutter and mechanical aortic valve replacement on Coumadin , PVCs, CKD stage IIIa, hypertension, cryptogenic cirrhosis, GERD, hypothyroidism presenting with complaints of shortness of breath and bilateral lower extremity edema.   Patient states his doctor had reduced the dose of his home Lasix  from 40 mg daily to 20 mg daily a month ago.  Since then he is having progressively worsening dyspnea on exertion and bilateral lower extremity edema. Also with increasing melena outpatient basis.  Came to ED for the same.  Found to have Hgb 6.9 and given 1 unit PRBC   04/20 volume status has improved, no signs of bleeding, and he has been transitioned to oral antibiotic therapy.  Possible discharge home tomorrow.   Assessment and Plan: * Acute on chronic diastolic CHF (congestive heart failure) (HCC) Echocardiogram with preserved LV systolic function with EF 60 to 65%, mild LVH, RV systolic function low normal, RVSP 53,7 mmHg. LA with moderate dilation, moderate tricuspid regurgitation, St Jude mechanical valve at the aortic position, with adequate functioning.   Acute on chronic core pulmonale Pulmonary hypertension.   Urine output is 1050 ml Systolic blood pressure 120 to 140 mmHg.   Improved volume status, holding loop diuretic for now.  Limited pharmacologic options due to acutely reduced GFR.   Subtherapeutic inr for the last 2 days, will start patient on IV heparin  for bridging anticoagulation.   Acute upper GI bleed Acute blood loss anemia.  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   Continue with pantoprazole  IV bid.  Ok to resume warfarin.   Follow up Hgb is 8.9 with no signs of recurrent bleeding.   Iron  deficiency anemia, sp IV iron  sucrose 500 mg    Paroxysmal atrial flutter (HCC) Continue rate control with diltiazem  and anticoagulation with warfarin.  Bridging with heparin  for subtherapeutic INR  CAD (coronary artery disease) of artery bypass graft No chest pain, no acute coronary syndrome.   Cirrhosis of liver without ascites (HCC) No signs of acute decompensation Continue close follow up.   Essential hypertension Continue blood pressure monitoring.  Continue with diltiazem  for atrial fibrillation   Stage 3a chronic kidney disease (HCC) Renal function today with serum cr at 1,70 with K at 4.1 and serum bicarbonate at 23  Na 137  Continue to hold on furosemide  for now.  Follow up renal function and electrolytes in am.   Hypothyroidism Continue with levothyroxine    Left lower lobe pneumonia Patient has been on antibiotic therapy with ceftriaxone  and azithromycin . No leukocytosis and no fever.  Blood culture one of two positive for streptococcus species. (Possible contamination).   Follow up chest radiograph with improvement in infiltrates, personally reviewed, possible small left effusion.  Continue now with antibiotic therapy with cephalexin   to complete 5 days total  Follow up on repeat blood cultures.       Subjective: Patient with improvement in dyspnea, no chest pain, no palpitations, no melena or hematochezia, no hematemesis   Physical Exam: Vitals:   12/21/23 1934 12/21/23 2335 12/22/23 0351 12/22/23 0743  BP: (!) 124/56 (!) 115/51 (!) 105/49 134/66  Pulse:  74 72 69 70  Resp: 19 19 16 20   Temp: 98.2 F (36.8 C) 98 F (36.7 C) 98.1 F (36.7 C) 98.1 F (36.7 C)  TempSrc: Oral Oral Oral Oral  SpO2: 94% 92% 93% 94%  Weight:      Height:       Neurology awake and alert ENT with mild pallor with no icterus Cardiovascular  with S1 and S2 present and regular with no gallops, rubs or murmurs Respiratory with mild rales at bases with no wheezing or rhonchi  Abdomen with no distention  Trace lower extremity edema with no pitting  Data Reviewed:    Family Communication: no family at the bedside   Disposition: Status is: Inpatient Remains inpatient appropriate because: possible discharge home tomorrow   Planned Discharge Destination: Home     Author: Albertus Alt, MD 12/22/2023 10:22 AM  For on call review [email or web address].

## 2023-12-23 ENCOUNTER — Encounter (HOSPITAL_COMMUNITY): Payer: Self-pay | Admitting: Gastroenterology

## 2023-12-23 ENCOUNTER — Inpatient Hospital Stay (HOSPITAL_COMMUNITY)

## 2023-12-23 DIAGNOSIS — I25700 Atherosclerosis of coronary artery bypass graft(s), unspecified, with unstable angina pectoris: Secondary | ICD-10-CM | POA: Diagnosis not present

## 2023-12-23 DIAGNOSIS — I5023 Acute on chronic systolic (congestive) heart failure: Secondary | ICD-10-CM | POA: Diagnosis not present

## 2023-12-23 DIAGNOSIS — R509 Fever, unspecified: Secondary | ICD-10-CM | POA: Diagnosis not present

## 2023-12-23 DIAGNOSIS — I480 Paroxysmal atrial fibrillation: Secondary | ICD-10-CM | POA: Diagnosis not present

## 2023-12-23 DIAGNOSIS — D62 Acute posthemorrhagic anemia: Secondary | ICD-10-CM | POA: Diagnosis not present

## 2023-12-23 DIAGNOSIS — D500 Iron deficiency anemia secondary to blood loss (chronic): Secondary | ICD-10-CM | POA: Diagnosis not present

## 2023-12-23 DIAGNOSIS — R531 Weakness: Secondary | ICD-10-CM

## 2023-12-23 DIAGNOSIS — B954 Other streptococcus as the cause of diseases classified elsewhere: Secondary | ICD-10-CM | POA: Diagnosis not present

## 2023-12-23 DIAGNOSIS — R6883 Chills (without fever): Secondary | ICD-10-CM

## 2023-12-23 DIAGNOSIS — I5033 Acute on chronic diastolic (congestive) heart failure: Secondary | ICD-10-CM | POA: Diagnosis not present

## 2023-12-23 DIAGNOSIS — I4892 Unspecified atrial flutter: Secondary | ICD-10-CM | POA: Diagnosis not present

## 2023-12-23 DIAGNOSIS — K922 Gastrointestinal hemorrhage, unspecified: Secondary | ICD-10-CM | POA: Diagnosis not present

## 2023-12-23 LAB — RENAL FUNCTION PANEL
Albumin: 2 g/dL — ABNORMAL LOW (ref 3.5–5.0)
Anion gap: 4 — ABNORMAL LOW (ref 5–15)
BUN: 21 mg/dL (ref 8–23)
CO2: 23 mmol/L (ref 22–32)
Calcium: 8 mg/dL — ABNORMAL LOW (ref 8.9–10.3)
Chloride: 110 mmol/L (ref 98–111)
Creatinine, Ser: 1.48 mg/dL — ABNORMAL HIGH (ref 0.61–1.24)
GFR, Estimated: 45 mL/min — ABNORMAL LOW (ref >60)
Glucose, Bld: 90 mg/dL (ref 70–99)
Phosphorus: 2.7 mg/dL (ref 2.5–4.6)
Potassium: 4.2 mmol/L (ref 3.5–5.1)
Sodium: 137 mmol/L (ref 135–145)

## 2023-12-23 LAB — CBC
HCT: 27.5 % — ABNORMAL LOW (ref 39.0–52.0)
Hemoglobin: 8.3 g/dL — ABNORMAL LOW (ref 13.0–17.0)
MCH: 31.8 pg (ref 26.0–34.0)
MCHC: 30.2 g/dL (ref 30.0–36.0)
MCV: 105.4 fL — ABNORMAL HIGH (ref 80.0–100.0)
Platelets: 155 10*3/uL (ref 150–400)
RBC: 2.61 MIL/uL — ABNORMAL LOW (ref 4.22–5.81)
RDW: 15.1 % (ref 11.5–15.5)
WBC: 6 10*3/uL (ref 4.0–10.5)
nRBC: 0 % (ref 0.0–0.2)

## 2023-12-23 LAB — PROTIME-INR
INR: 1.8 — ABNORMAL HIGH (ref 0.8–1.2)
Prothrombin Time: 21.2 s — ABNORMAL HIGH (ref 11.4–15.2)

## 2023-12-23 LAB — CULTURE, BLOOD (ROUTINE X 2): Special Requests: ADEQUATE

## 2023-12-23 LAB — HEPARIN LEVEL (UNFRACTIONATED)
Heparin Unfractionated: 0.85 [IU]/mL — ABNORMAL HIGH (ref 0.30–0.70)
Heparin Unfractionated: 0.8 [IU]/mL — ABNORMAL HIGH (ref 0.30–0.70)

## 2023-12-23 LAB — ECHOCARDIOGRAM LIMITED
AV Mean grad: 7 mmHg
AV Peak grad: 12.1 mmHg
Ao pk vel: 1.74 m/s
Height: 70 in
Weight: 2772.5 [oz_av]

## 2023-12-23 MED ORDER — WARFARIN SODIUM 7.5 MG PO TABS
7.5000 mg | ORAL_TABLET | Freq: Once | ORAL | Status: AC
  Administered 2023-12-23: 7.5 mg via ORAL
  Filled 2023-12-23: qty 1

## 2023-12-23 MED ORDER — ENSURE ENLIVE PO LIQD
237.0000 mL | Freq: Two times a day (BID) | ORAL | Status: DC
  Administered 2023-12-23: 237 mL via ORAL

## 2023-12-23 MED ORDER — FUROSEMIDE 10 MG/ML IJ SOLN
40.0000 mg | Freq: Once | INTRAMUSCULAR | Status: AC
  Administered 2023-12-23: 40 mg via INTRAVENOUS
  Filled 2023-12-23: qty 4

## 2023-12-23 MED ORDER — MELATONIN 5 MG PO TABS
10.0000 mg | ORAL_TABLET | Freq: Every evening | ORAL | Status: DC | PRN
  Administered 2023-12-23 – 2023-12-27 (×4): 10 mg via ORAL
  Filled 2023-12-23 (×4): qty 2

## 2023-12-23 NOTE — Progress Notes (Signed)
 Progress Note  Patient Name: Jason Santana Date of Encounter: 12/23/2023  Primary Cardiologist: Dorothye Gathers, MD   Subjective   Patient seen and examined by his bedside.     Inpatient Medications    Scheduled Meds:  dextromethorphan -guaiFENesin   2 tablet Oral BID   diltiazem   120 mg Oral Daily   levothyroxine   125 mcg Oral QAC breakfast   pantoprazole   40 mg Oral BID   Warfarin - Pharmacist Dosing Inpatient   Does not apply q1600   Continuous Infusions:  cefTRIAXone  (ROCEPHIN )  IV Stopped (12/22/23 1610)   heparin  1,300 Units/hr (12/23/23 0740)   PRN Meds: acetaminophen , ipratropium-albuterol , loratadine , ondansetron  (ZOFRAN ) IV   Vital Signs    Vitals:   12/23/23 0354 12/23/23 0421 12/23/23 0700 12/23/23 0740  BP: (!) 117/52   (!) 125/54  Pulse: 77  73 75  Resp: 20  18 20   Temp: 98.4 F (36.9 C)   98.2 F (36.8 C)  TempSrc: Oral   Oral  SpO2: 95%  95% 94%  Weight:  78.6 kg    Height:        Intake/Output Summary (Last 24 hours) at 12/23/2023 0811 Last data filed at 12/23/2023 0740 Gross per 24 hour  Intake 977.89 ml  Output 1052 ml  Net -74.11 ml   Filed Weights   12/21/23 0509 12/22/23 1102 12/23/23 0421  Weight: 76.2 kg 79.1 kg 78.6 kg    Telemetry    Sinu rhythm - Personally Reviewed  ECG     None today- Personally Reviewed  Physical Exam     General: Comfortable Head: Atraumatic, normal size  Eyes: PEERLA, EOMI  Neck: Supple, normal JVD Cardiac: Normal S1, S2; RRR; no murmurs, rubs, or gallops Lungs: Clear to auscultation bilaterally Abd: Soft, nontender, no hepatomegaly  Ext: warm, no edema Musculoskeletal: No deformities, BUE and BLE strength normal and equal Skin: Warm and dry, no rashes   Neuro: Alert and oriented to person, place, time, and situation, CNII-XII grossly intact, no focal deficits  Psych: Normal mood and affect   Labs    Chemistry Recent Labs  Lab 12/17/23 2128 12/18/23 0455 12/19/23 0237 12/21/23 0206  12/22/23 0232 12/23/23 0232  NA 138 139   < > 138 137 137  K 4.4 3.7   < > 4.1 4.1 4.2  CL 112* 112*   < > 106 108 110  CO2 21* 21*   < > 23 23 23   GLUCOSE 133* 109*   < > 112* 125* 90  BUN 27* 24*   < > 25* 26* 21  CREATININE 1.46* 1.48*   < > 1.83* 1.70* 1.48*  CALCIUM  8.4* 8.2*   < > 8.2* 8.0* 8.0*  PROT 5.8* 5.9*  --   --   --   --   ALBUMIN 2.5* 2.5*  --   --   --  2.0*  AST 63* 49*  --   --   --   --   ALT 18 20  --   --   --   --   ALKPHOS 84 74  --   --   --   --   BILITOT 0.6 1.1  --   --   --   --   GFRNONAA 46* 45*   < > 35* 38* 45*  ANIONGAP 5 6   < > 9 6 4*   < > = values in this interval not displayed.     Hematology Recent Labs  Lab 12/21/23 0206 12/22/23 0232 12/23/23 0232  WBC 5.2 4.8 6.0  RBC 2.87* 2.72* 2.61*  HGB 9.2* 8.9* 8.3*  HCT 29.9* 28.6* 27.5*  MCV 104.2* 105.1* 105.4*  MCH 32.1 32.7 31.8  MCHC 30.8 31.1 30.2  RDW 16.4* 15.5 15.1  PLT 169 162 155    Cardiac EnzymesNo results for input(s): "TROPONINI" in the last 168 hours. No results for input(s): "TROPIPOC" in the last 168 hours.   BNP Recent Labs  Lab 12/17/23 2128  BNP 252.6*     DDimer No results for input(s): "DDIMER" in the last 168 hours.   Radiology    DG Chest 1 View Result Date: 12/21/2023 CLINICAL DATA:  Follow-up exam. EXAM: CHEST  1 VIEW COMPARISON:  12/20/2023. FINDINGS: The heart size and mediastinal contours are stable. There is atherosclerotic calcification of the aorta. A hazy opacity is noted at the left lung base and there is a small left pleural effusion. The right lung is clear. No pneumothorax bilaterally. Sternotomy wires and a prosthetic cardiac valve are noted. IMPRESSION: Small left pleural effusion with atelectasis or infiltrate at the left lung base, not significantly changed from the prior exam. Electronically Signed   By: Wyvonnia Heimlich M.D.   On: 12/21/2023 18:03    Cardiac Studies   Echo  and Zio  Patient Profile     89 y.o. male with a hx of CAD  s/p CABG 2000, s/p mechanical aortic valve replacement 2000 on chronic Coumadin  therapy, persistent A-fib/flutter s/p cardioversion 2019 and ablation 05/2019, hypertension, aortic aneurysm, hypothyroidism, BPH, recurrent GI bleed, CKD stage III AA, cryptogenic cirrhosis, GERD.  Assessment & Plan    Acute on chronic diastolic heart failure with shortness of breath.  We held his Lasix  for two days now cr has improved. Will give Lasix  40 mg IV today.   Bacteremia - limited echo planned for today to look for vegetation. But he will benefit greatly from moving ahead with the TEE given his  mechanical valve.  Abx per ID  Acute blood loss anemia in the setting of recurrent GI bleeding. Hgb 8.3 today. Continue to monitor. Coumadin  restarted. INR 1.8.   Atrial fibrillation-he is in sinus rhythm this morning.  Continue Cardizem  anticoagulation to be restarted as noted above.   History of aortic valve repair stable on recent echo.  CKD stage III.-Creatinine pending for today well above his baseline we will monitor.  Will follow up today   Plan discussed with the patient.  No questions at this time.        For questions or updates, please contact [facility] Please consult [email or web address] for contact info under Cardiology/STEMI.      Signed, Daxten Kovalenko, DO  12/23/2023, 8:11 AM

## 2023-12-23 NOTE — Progress Notes (Signed)
 Mobility Specialist Progress Note;   12/23/23 1005  Mobility  Activity Transferred from bed to chair  Level of Assistance Minimal assist, patient does 75% or more  Assistive Device Other (Comment) (HHA)  Distance Ambulated (ft) 5 ft  Activity Response Tolerated well  Mobility Referral Yes  Mobility visit 1 Mobility  Mobility Specialist Start Time (ACUTE ONLY) 1005  Mobility Specialist Stop Time (ACUTE ONLY) 1015  Mobility Specialist Time Calculation (min) (ACUTE ONLY) 10 min   Pt agreeable to mobility. Required MinA to stand from EOB and MinG to safely transfer pt from bed to chair. VSS on 3LO2. No c/o when asked. Pt left in chair with all needs met, alarm on. Wife in room.   Janit Meline Mobility Specialist Please contact via SecureChat or [employer] [phone]

## 2023-12-23 NOTE — Progress Notes (Signed)
 Echocardiogram 2D Echocardiogram has been performed.  Emmaline Haring Samya Siciliano RDCS 12/23/2023, 10:35 AM

## 2023-12-23 NOTE — Progress Notes (Signed)
 [facility]  Date of Admission:  12/17/2023      Total days of antibiotics: 4  Ceftriaxone    Azithromycin            ASSESSMENT: Jason Santana is a 89 y.o. male admitted from home with:  Weakness - Anemia and UGIB -  S/p EGD 4/18 which he tolerated well.  Had some supra therapeutic INRs outpatient  -GI/primary team managing.    Strep Mitis/Oralis Bacteremia -  Mechanical AVR -  Unexplained Episodes of Chills -  Recent dental extraction 4-6 weeks back now without any prophylactic antibiotics fits timeline for bacteremia to be real even though only in 1 bottle. TTE was negative. TEE arranged to get a better look at the mechanical valve given higher risk if this turns out to be a real blood stream infection.  -continue ceftriaxone  -FU tee results (tomorrow) -blood repeated and prelim negative.   Pneumonia -  Continues on supplemental oxygen - cxr with infiltrate. Has a productive cough still.  -continue ceftriaxone    D/W Wife and patient today    PLAN: Continue ceftriaxone  Follow up TEE 4/22 Follow repeat blood cultures   Principal Problem:   Acute on chronic diastolic CHF (congestive heart failure) (HCC) Active Problems:   Paroxysmal atrial flutter (HCC)   Essential hypertension   CAD (coronary artery disease) of artery bypass graft   Hypothyroidism   Acute upper GI bleed   Stage 3a chronic kidney disease (HCC)   Cirrhosis of liver without ascites (HCC)   Left lower lobe pneumonia    dextromethorphan -guaiFENesin   2 tablet Oral BID   diltiazem   120 mg Oral Daily   feeding supplement  237 mL Oral BID BM   levothyroxine   125 mcg Oral QAC breakfast   pantoprazole   40 mg Oral BID   warfarin  7.5 mg Oral ONCE-1600   Warfarin - Pharmacist Dosing Inpatient   Does not apply q1600    SUBJECTIVE: Not feeling much better since admission. His wife is at the bedside to help with the history.  He has a mechanical AVR. Had tooth  extraction about 4-6 weeks ago now. No prophylactic antibiotics. Has had some chills at home described. Major thing is weakness that brought him in.  Has a bit of a cough still. Remains on supplemental oxygen.    Review of Systems: Review of Systems  Constitutional:  Positive for chills and malaise/fatigue. Negative for fever.  Respiratory:  Positive for cough.      Allergies  Allergen Reactions   Carvedilol  Shortness Of Breath    wheezing   Lopid [Gemfibrozil] Other (See Comments)    transaminitis   Monopril [Fosinopril] Other (See Comments)    transaminitis   Vicodin [Hydrocodone-Acetaminophen ]     Severe sensitivity   Phenergan [Promethazine Hcl] Other (See Comments)    Severe somnolence.   Zocor [Simvastatin]     Short memory loss.   Benicar [Olmesartan]     Abdominal cramping and increased stools/ irritable bowel   Codeine Nausea And Vomiting   Prilosec [Omeprazole]     dyspepsia    OBJECTIVE: Vitals:   12/23/23 1000 12/23/23 1100 12/23/23 1200 12/23/23 1213  BP:    (!) 124/53  Pulse: 73     Resp: (!) 23 17 19  (!) 21  Temp:    98.1 F (36.7 C)  TempSrc:    Oral  SpO2: 98%   95%  Weight:  Height:       Body mass index is 24.86 kg/m.  Physical Exam Constitutional:      Appearance: He is well-developed. He is not ill-appearing.  HENT:     Head:     Comments: Hard of hearing Cardiovascular:     Rate and Rhythm: Normal rate and regular rhythm.  Pulmonary:     Effort: Pulmonary effort is normal.     Breath sounds: Normal breath sounds.     Comments: Nasal cannula oxygen  Musculoskeletal:        General: Normal range of motion.  Skin:    General: Skin is warm and dry.     Capillary Refill: Capillary refill takes less than 2 seconds.  Neurological:     Mental Status: He is alert and oriented to person, place, and time.     Lab Results Lab Results  Component Value Date   WBC 6.0 12/23/2023   HGB 8.3 (L) 12/23/2023   HCT 27.5 (L) 12/23/2023    MCV 105.4 (H) 12/23/2023   PLT 155 12/23/2023    Lab Results  Component Value Date   CREATININE 1.48 (H) 12/23/2023   BUN 21 12/23/2023   NA 137 12/23/2023   K 4.2 12/23/2023   CL 110 12/23/2023   CO2 23 12/23/2023    Lab Results  Component Value Date   ALT 20 12/18/2023   AST 49 (H) 12/18/2023   ALKPHOS 74 12/18/2023   BILITOT 1.1 12/18/2023     Microbiology: Recent Results (from the past 240 hours)  MRSA Next Gen by PCR, Nasal     Status: None   Collection Time: 12/18/23  4:08 PM   Specimen: Nasal Mucosa; Nasal Swab  Result Value Ref Range Status   MRSA by PCR Next Gen NOT DETECTED NOT DETECTED Final    Comment: (NOTE) The GeneXpert MRSA Assay (FDA approved for NASAL specimens only), is one component of a comprehensive MRSA colonization surveillance program. It is not intended to diagnose MRSA infection nor to guide or monitor treatment for MRSA infections. Test performance is not FDA approved in patients less than 52 years old. Performed at [facility], [address], [place], [place] [address]   Culture, blood (Routine X 2) w Reflex to ID Panel     Status: Abnormal   Collection Time: 12/20/23 11:26 AM   Specimen: BLOOD LEFT ARM  Result Value Ref Range Status   Specimen Description BLOOD LEFT ARM  Final   Special Requests   Final    BOTTLES DRAWN AEROBIC AND ANAEROBIC Blood Culture adequate volume   Culture  Setup Time   Final    GRAM POSITIVE COCCI IN CHAINS ANAEROBIC BOTTLE ONLY CRITICAL RESULT CALLED TO, READ BACK BY AND VERIFIED WITH: PHARMD J LEDFORD 12/21/2023 @ 0412 BY AB Performed at [facility], [address], [place], [place] [address]    Culture STREPTOCOCCUS MITIS/ORALIS (A)  Final   Report Status 12/23/2023 FINAL  Final   Organism ID, Bacteria STREPTOCOCCUS MITIS/ORALIS  Final      Susceptibility   Streptococcus mitis/oralis - MIC*    PENICILLIN <=0.06 SENSITIVE Sensitive     CEFTRIAXONE  <=0.12 SENSITIVE Sensitive      LEVOFLOXACIN  0.5 SENSITIVE Sensitive     VANCOMYCIN 0.5 SENSITIVE Sensitive     * STREPTOCOCCUS MITIS/ORALIS  Blood Culture ID Panel (Reflexed)     Status: Abnormal   Collection Time: 12/20/23 11:26 AM  Result Value Ref Range Status   Enterococcus  faecalis NOT DETECTED NOT DETECTED Final   Enterococcus Faecium NOT DETECTED NOT DETECTED Final   Listeria monocytogenes NOT DETECTED NOT DETECTED Final   Staphylococcus species NOT DETECTED NOT DETECTED Final   Staphylococcus aureus (BCID) NOT DETECTED NOT DETECTED Final   Staphylococcus epidermidis NOT DETECTED NOT DETECTED Final   Staphylococcus lugdunensis NOT DETECTED NOT DETECTED Final   Streptococcus species DETECTED (A) NOT DETECTED Final    Comment: Not Enterococcus species, Streptococcus agalactiae, Streptococcus pyogenes, or Streptococcus pneumoniae. CRITICAL RESULT CALLED TO, READ BACK BY AND VERIFIED WITH: PHARMD J LEDFORD 12/21/2023 @ 0412 BY AB    Streptococcus agalactiae NOT DETECTED NOT DETECTED Final   Streptococcus pneumoniae NOT DETECTED NOT DETECTED Final   Streptococcus pyogenes NOT DETECTED NOT DETECTED Final   A.calcoaceticus-baumannii NOT DETECTED NOT DETECTED Final   Bacteroides fragilis NOT DETECTED NOT DETECTED Final   Enterobacterales NOT DETECTED NOT DETECTED Final   Enterobacter cloacae complex NOT DETECTED NOT DETECTED Final   Escherichia coli NOT DETECTED NOT DETECTED Final   Klebsiella aerogenes NOT DETECTED NOT DETECTED Final   Klebsiella oxytoca NOT DETECTED NOT DETECTED Final   Klebsiella pneumoniae NOT DETECTED NOT DETECTED Final   Proteus species NOT DETECTED NOT DETECTED Final   Salmonella species NOT DETECTED NOT DETECTED Final   Serratia marcescens NOT DETECTED NOT DETECTED Final   Haemophilus influenzae NOT DETECTED NOT DETECTED Final   Neisseria meningitidis NOT DETECTED NOT DETECTED Final   Pseudomonas aeruginosa NOT DETECTED NOT DETECTED Final   Stenotrophomonas maltophilia NOT DETECTED NOT  DETECTED Final   Candida albicans NOT DETECTED NOT DETECTED Final   Candida auris NOT DETECTED NOT DETECTED Final   Candida glabrata NOT DETECTED NOT DETECTED Final   Candida krusei NOT DETECTED NOT DETECTED Final   Candida parapsilosis NOT DETECTED NOT DETECTED Final   Candida tropicalis NOT DETECTED NOT DETECTED Final   Cryptococcus neoformans/gattii NOT DETECTED NOT DETECTED Final    Comment: Performed at [facility], [address], [place], [place] [address]  Culture, blood (Routine X 2) w Reflex to ID Panel     Status: None (Preliminary result)   Collection Time: 12/20/23 11:27 AM   Specimen: BLOOD RIGHT ARM  Result Value Ref Range Status   Specimen Description BLOOD RIGHT ARM  Final   Special Requests   Final    BOTTLES DRAWN AEROBIC AND ANAEROBIC Blood Culture adequate volume   Culture   Final    NO GROWTH 3 DAYS Performed at [facility], [address] [place], [place] [address]    Report Status PENDING  Incomplete  Culture, blood (Routine X 2) w Reflex to ID Panel     Status: None (Preliminary result)   Collection Time: 12/22/23  2:32 AM   Specimen: BLOOD RIGHT HAND  Result Value Ref Range Status   Specimen Description BLOOD RIGHT HAND  Final   Special Requests   Final    BOTTLES DRAWN AEROBIC AND ANAEROBIC Blood Culture adequate volume   Culture   Final    NO GROWTH 1 DAY Performed at [facility], [address] [place], [place] [address]    Report Status PENDING  Incomplete  Culture, blood (Routine X 2) w Reflex to ID Panel     Status: None (Preliminary result)   Collection Time: 12/22/23  2:44 AM   Specimen: BLOOD LEFT ARM  Result Value Ref Range Status   Specimen Description BLOOD LEFT ARM  Final   Special Requests  Final    BOTTLES DRAWN AEROBIC AND ANAEROBIC Blood Culture adequate volume   Culture   Final    NO GROWTH 1 DAY Performed at [facility], [address], [place], [place] [address]    Report Status PENDING   Incomplete    Gibson Kurtz, MSN, NP-C [facility]  [email or web address] Pager: [phone] Office: [phone] [facility] Main Line: [phone] *Secure Chat Communication Welcome

## 2023-12-23 NOTE — Progress Notes (Signed)
 Pharmacy Consult for Warfarin w/ heparin  gtt bridge Indication: atrial fibrillation and mAVR  Allergies  Allergen Reactions   Carvedilol  Shortness Of Breath    wheezing   Lopid [Gemfibrozil] Other (See Comments)    transaminitis   Monopril [Fosinopril] Other (See Comments)    transaminitis   Vicodin [Hydrocodone-Acetaminophen ]     Severe sensitivity   Phenergan [Promethazine Hcl] Other (See Comments)    Severe somnolence.   Zocor [Simvastatin]     Short memory loss.   Benicar [Olmesartan]     Abdominal cramping and increased stools/ irritable bowel   Codeine Nausea And Vomiting   Prilosec [Omeprazole]     dyspepsia    Patient Measurements: Height: 5\' 10"  (177.8 cm) Weight: 78.6 kg (173 lb 4.5 oz) IBW/kg (Calculated) : 73 HEPARIN  DW (KG): 82.3  Vital Signs: Temp: 98.2 F (36.8 C) (04/21 1506) Temp Source: Oral (04/21 1506) BP: 119/61 (04/21 1506) Pulse Rate: 79 (04/21 1800)  Labs: Recent Labs    12/21/23 0206 12/21/23 0929 12/22/23 0232 12/22/23 1732 12/23/23 0232 12/23/23 0836 12/23/23 1836  HGB 9.2*  --  8.9*  --  8.3*  --   --   HCT 29.9*  --  28.6*  --  27.5*  --   --   PLT 169  --  162  --  155  --   --   LABPROT  --  21.9* 20.3*  --  21.2*  --   --   INR  --  1.9* 1.7*  --  1.8*  --   --   HEPARINUNFRC  --   --   --  0.46  --  0.85* 0.80*  CREATININE 1.83*  --  1.70*  --  1.48*  --   --     Estimated Creatinine Clearance: 34.9 mL/min (A) (by C-G formula based on SCr of 1.48 mg/dL (H)).  Assessment: Jason Santana a 89 y.o. male presented with GIB, now s/p EGD 4/18. In EGD op note, GI states it is ok to resume warfarin on 4/19, confirmed OK to restart with TRH. OF note, patient is currently on azithromycin  for possible pneumonia. Given this DDI, which would likely lead to increased INR, recent GIB with diminished PO intake, and INR on last check 2.5 (4/18), will initiate warfarin at lower than PTA dosing and monitor closely.   Anticoagulation PTA:  Warfarin PTA dosing: 5 mg M/F, 7.5 mg all other days  Notable DDI's - azithromycin  500 mg 4/18>19 (increase warfarin concentrations).  Heparin  level this evening came back supratherapeutic at 0.8, on 1150 units/hr. Hgb 8.3, plt 155. No s/sx of bleeding or infusion issues. Confirmed drawn correctly. Already received warfarin 7.5 mg tonight.   Goal of Therapy:  INR 2-3 Heparin  level 0.3-0.7 units/ml Monitor platelets by anticoagulation protocol: Yes   Plan:  Decrease heparin  infusion to 1000 units/hr 8h heparin  level  Daily HL/INR/CBC  Monitor for signs of bleeding / issues with heparin  gtt and report to Pharmacist on the unit  Thank you for allowing pharmacy to participate in this patient's care,  Nieves Bars, PharmD, BCCCP Clinical Pharmacist  Phone: [phone] 12/23/2023 7:42 PM  Please check AMION for all [facility] Pharmacy phone numbers After 10:00 PM, call Main Pharmacy [phone]

## 2023-12-23 NOTE — Progress Notes (Addendum)
 Progress Note   Patient: Jason Santana NGE:[id number] DOB: 1932-01-13 DOA: 12/17/2023     6 DOS: the patient was seen and examined on 12/23/2023   Brief hospital course: Jason Santana was admitted to the hospital with the working diagnosis of acute blood loss anemia due to upper GI bleed, complicated with bacteremia.   89 y.o. male with medical history significant of CAD, A-fib/flutter and mechanical aortic valve, PVCs, CKD stage IIIa, hypertension, cryptogenic cirrhosis, GERD, hypothyroidism presenting with complaints of shortness of breath and bilateral lower extremity edema.  Apparently his furosemide  dose was decreased from 40 to 20 mg about 4 weeks prior to admission. Since then he had progressive and worsening symptoms of lower extremity edema and dyspnea on exertion, being more severe over the last 7 days prior to admission.  His family called EMS and he was brought to the hospital.  On his initial physical examination his blood pressure was 115/50, HR 81, RR 22 and 02 saturation 98% Lungs with no wheezing or rhonchi, heart with S1 and S2 present, reguar with positive S2 mechanical click, abdomen with no distention and positive lower extremity edema +++ pitting bilaterally.   Na 138, K 4.4 Cl 112 bicarbonate 21, glucose 113, bun 27 cr 1,46  BNP 252, High sensitive troponin 255 and 251  Wbc 6,0 hgb 6.8 plt 244  Urine analysis with SG 1,005 with pH 7,0 and negative nitrates and negative leukocytes. Negative hgb   Chest radiograph with hypoinflation with mild hilar vascular congestion and small left plural effusion. Sternotomy wires in place.   EKG 82 bpm, left axis deviation, qtc 492, sinus rhythm with no significant ST segment changes, negative T wave lead I and aVL.   Patient had one unit PRBC transfuses Placed on IV furosemide  for diuresis.   04/18 EGD with GAVE, recommendations to continue pantoprazole .  One bottle blood culture positive for streptococcus.  04/20 volume status has  improved, no signs of bleeding, and he has continued with IV antibiotic therapy until rule out endocarditis.  04/21 resume diuretic therapy, planned for TEE rule out endocarditis at the aortic mechanical valve.   Assessment and Plan: * Acute on chronic diastolic CHF (congestive heart failure) (HCC) Echocardiogram with preserved LV systolic function with EF 60 to 65%, mild LVH, RV systolic function low normal, RVSP 53,7 mmHg. LA with moderate dilation, moderate tricuspid regurgitation, St Jude mechanical valve at the aortic position, with adequate functioning.   Acute on chronic core pulmonale Pulmonary hypertension.   Urine output is 752 ml Systolic blood pressure 120 to 140 mmHg.   Resuming loop diuretic today with furosemide  40 mg IV today, possible po tomorrow depending on renal function.  Limited pharmacologic options due to acutely reduced GFR.   INR 1,8, continue IV heparin  for bridging, continue warfarin per pharmacy protocol.   Acute upper GI bleed Acute blood loss anemia.  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  Transitioned to po pantoprazole .  Continue anticoagulation with warfarin/ heparin  IV   Follow up Hgb is 8.3 with no signs of recurrent bleeding.  Iron  deficiency anemia, sp IV iron  sucrose 500 mg   Continue to follow up cell count.   Paroxysmal atrial flutter (HCC) Continue rate control with diltiazem  and anticoagulation with warfarin.  Bridging with heparin  for subtherapeutic INR  CAD (coronary artery disease) of artery bypass graft No chest pain, no acute coronary syndrome.   Cirrhosis of liver without ascites (HCC) No signs  of acute decompensation Continue close follow up.   Essential hypertension Continue blood pressure monitoring.  Continue with diltiazem  for atrial fibrillation   Stage 3a chronic kidney disease (HCC) AKI,   Today with signs of volume overload.  Renal function  with serum cr at 1,48 with K 4,2 and serum bicarbonate at 23  Na 137 P 2.7   Plan to follow response to IV furosemide   Follow up renal function and electrolytes.  Avoid hypotension or nephrotoxic medications.   Hypothyroidism Continue with levothyroxine    Left lower lobe pneumonia Patient has been on antibiotic therapy with ceftriaxone  and azithromycin , for possible pneumonia.  No leukocytosis and no fever.  04/18 Blood culture one of two positive for streptococcus mitis/oralis  (Possible bacteremia)   04/19 Follow up chest radiograph with improvement in infiltrates, personally reviewed, possible small left effusion.   In the setting of aortic mechanical valve, plan is to continue IV antibiotic therapy with ceftriaxone  and rule out endocarditis with TEE.  Follow up blood cultures with no growth.         Subjective: Patient with pulmonary congestion and dyspnea this morning with no chest pain or palpitations, his lower extremity edema continue stable   Physical Exam: Vitals:   12/23/23 0354 12/23/23 0421 12/23/23 0700 12/23/23 0740  BP: (!) 117/52   (!) 125/54  Pulse: 77  73 75  Resp: 20  18 20   Temp: 98.4 F (36.9 C)   98.2 F (36.8 C)  TempSrc: Oral   Oral  SpO2: 95%  95% 94%  Weight:  78.6 kg    Height:       Neurology awake and alert, deconditioned ENT with mild pallor Cardiovascular wit S1 and S2 present and regular, positive S2 mechanical click at the base with no gallops or rubs Moderate JVD Respiratory with bilateral rales with no wheezing or rhonchi  Abdomen with no distention  Trace lower extremity edema  Data Reviewed:    Family Communication: no family at the bedside   Disposition: Status is: Inpatient Remains inpatient appropriate because: TEE  Planned Discharge Destination: Home    Author: Albertus Alt, MD 12/23/2023 9:56 AM  For on call review [email or web address].

## 2023-12-23 NOTE — TOC Progression Note (Signed)
 Transition of Care [facility]) - Progression Note    Patient Details  Name: Jason Santana MRN: [id number] Date of Birth: 08-03-1932  [facility]) CM/SW Contact  Jeani Mill, RN Phone Number: 12/23/2023, 3:11 PM  Clinical Narrative:    Patient is agreeable to home health and DME. This RNCM offered choice for Home Health, Patient states he has no preference, RNCM made referral to Amy with [facility], She is able to take referral.  RNCM will order walker closer to discharge.  Address, Phone number and PCP verified. Need home health PT, OT, RN orders  Expected Discharge Plan: Home w Home Health Services Barriers to Discharge: Continued Medical Work up  Expected Discharge Plan and Services   Discharge Planning Services: CM Consult Post Acute Care Choice: Home Health, Durable Medical Equipment Living arrangements for the past 2 months: Single Family Home                           HH Arranged: RN, PT, OT [facility] Agency: [facility] Date [facility] Agency Contacted: 12/23/23 Time HH Agency Contacted: 1510 Representative spoke with at [facility] Agency: Amy   Social Determinants of Health (SDOH) Interventions SDOH Screenings   Food Insecurity: No Food Insecurity (12/18/2023)  Housing: Low Risk  (12/18/2023)  Transportation Needs: No Transportation Needs (12/18/2023)  Utilities: Not At Risk (12/18/2023)  Depression (PHQ2-9): Low Risk  (11/13/2023)  Social Connections: Socially Integrated (12/18/2023)  Tobacco Use: Medium Risk (12/20/2023)    Readmission Risk Interventions     No data to display

## 2023-12-23 NOTE — Plan of Care (Signed)

## 2023-12-23 NOTE — Progress Notes (Signed)
 Physical Therapy Treatment Patient Details Name: Jason Santana MRN: [id number] DOB: September 29, 1931 Today's Date: 12/23/2023   History of Present Illness Pt is an 89 y/o M presenting to ED on 4/15 wtih BLE edema x1 week and worsening dyspnea, admitted for acute GI bleed. CXR with L basilar opacity favoring combination of L pleural effusion, L lung atelectasis. 4/18 EGD and upper GI endoscopy. PMH includes A fib, A flutter, AVR, PVCs, CKD IIIA, HTN, cryptogenic cirrhosis, GERD, hypothryoidism    PT Comments  Patient progressing slowly.  Self limited with distance though reported wanting to take a nap so assisted back to bed.  Seems concerned for his wife as she is 89, though patient not needing much assistance from higher surface for sit to stand.  Hopeful for return home with HHPT at d/c.  PT will continue to follow.     If plan is discharge home, recommend the following: A little help with walking and/or transfers;A little help with bathing/dressing/bathroom;Assistance with cooking/housework;Assist for transportation;Help with stairs or ramp for entrance   Can travel by private vehicle        Equipment Recommendations  Rolling walker (2 wheels)    Recommendations for Other Services       Precautions / Restrictions Precautions Precautions: Fall Recall of Precautions/Restrictions: Intact Restrictions Weight Bearing Restrictions Per Provider Order: No     Mobility  Bed Mobility Overal bed mobility: Needs Assistance Bed Mobility: Supine to Sit, Sit to Supine     Supine to sit: Supervision, Used rails, HOB elevated Sit to supine: Supervision, HOB elevated   General bed mobility comments: assist for lines, balance initially needing rails    Transfers Overall transfer level: Needs assistance Equipment used: Rolling walker (2 wheels)   Sit to Stand: Min assist           General transfer comment: up to stand from low bed position with min A, with bed a little higher with CGA for  balance, practiced x 4 reps for LE strength and cues for safety with hand placement    Ambulation/Gait Ambulation/Gait assistance: Contact guard assist Gait Distance (Feet): 120 Feet Assistive device: Rolling walker (2 wheels) Gait Pattern/deviations: Step-through pattern, Decreased stride length       General Gait Details: mobilizing with RW safely, on 3L O2 throughout mild wheeze noted   Stairs             Wheelchair Mobility     Tilt Bed    Modified Rankin (Stroke Patients Only)       Balance Overall balance assessment: Needs assistance   Sitting balance-Leahy Scale: Good     Standing balance support: Bilateral upper extremity supported Standing balance-Leahy Scale: Poor Standing balance comment: UE support for balance, using hands on walker initially sit to stand, improved with cues and practice                            Communication Communication Communication: No apparent difficulties  Cognition Arousal: Alert Behavior During Therapy: WFL for tasks assessed/performed   PT - Cognitive impairments: No apparent impairments                         Following commands: Intact      Cueing Cueing Techniques: Verbal cues  Exercises      General Comments General comments (skin integrity, edema, etc.): SpO2 98 prior to and post ambulation on 3L O2  Pertinent Vitals/Pain Pain Assessment Pain Assessment: No/denies pain    Home Living                          Prior Function            PT Goals (current goals can now be found in the care plan section) Progress towards PT goals: Progressing toward goals    Frequency    Min 2X/week      PT Plan      Co-evaluation              AM-PAC PT "6 Clicks" Mobility   Outcome Measure  Help needed turning from your back to your side while in a flat bed without using bedrails?: None Help needed moving from lying on your back to sitting on the side of a  flat bed without using bedrails?: A Little Help needed moving to and from a bed to a chair (including a wheelchair)?: A Little Help needed standing up from a chair using your arms (e.g., wheelchair or bedside chair)?: A Little Help needed to walk in hospital room?: A Little Help needed climbing 3-5 steps with a railing? : A Little 6 Click Score: 19    End of Session Equipment Utilized During Treatment: Gait belt;Oxygen Activity Tolerance: Patient tolerated treatment well Patient left: in bed;with call bell/phone within reach   PT Visit Diagnosis: Other abnormalities of gait and mobility (R26.89);Muscle weakness (generalized) (M62.81)     Time: 1610-9604 PT Time Calculation (min) (ACUTE ONLY): 22 min  Charges:    $Gait Training: 8-22 mins PT General Charges $$ ACUTE PT VISIT: 1 Visit                     Abigail Hoff, PT Acute Rehabilitation Services [phone] 12/23/2023    [name] 12/23/2023, 1:23 PM

## 2023-12-23 NOTE — Progress Notes (Signed)
 I did follow up with the patient and his wife ( on the phone) while I was in the room.  She had left prior to me getting there.  I reviewed his echocardiogram with he and his wife mechanical valve well-seated no evidence of any endocarditis.  We have to hold previous plans for TEE because his hemoglobin has dropped again.  With his history of recent GI bleed and new AVM and hypertensive gastropathy with way since this is a true indication for TEE.  He will benefit from repeat TTE before the end of the week or by the end of the week.  I have notified the hospitalist as well about this change in care plan.

## 2023-12-23 NOTE — Progress Notes (Signed)
 Pharmacy Consult for Warfarin w/ heparin  gtt bridge Indication: atrial fibrillation and mAVR  Allergies  Allergen Reactions   Carvedilol  Shortness Of Breath    wheezing   Lopid [Gemfibrozil] Other (See Comments)    transaminitis   Monopril [Fosinopril] Other (See Comments)    transaminitis   Vicodin [Hydrocodone-Acetaminophen ]     Severe sensitivity   Phenergan [Promethazine Hcl] Other (See Comments)    Severe somnolence.   Zocor [Simvastatin]     Short memory loss.   Benicar [Olmesartan]     Abdominal cramping and increased stools/ irritable bowel   Codeine Nausea And Vomiting   Prilosec [Omeprazole]     dyspepsia    Patient Measurements: Height: 5\' 10"  (177.8 cm) Weight: 78.6 kg (173 lb 4.5 oz) IBW/kg (Calculated) : 73 HEPARIN  DW (KG): 82.3  Vital Signs: Temp: 98.2 F (36.8 C) (04/21 0740) Temp Source: Oral (04/21 0740) BP: 125/54 (04/21 0740) Pulse Rate: 75 (04/21 0740)  Labs: Recent Labs    12/21/23 0206 12/21/23 0929 12/22/23 0232 12/22/23 1732 12/23/23 0232 12/23/23 0836  HGB 9.2*  --  8.9*  --  8.3*  --   HCT 29.9*  --  28.6*  --  27.5*  --   PLT 169  --  162  --  155  --   LABPROT  --  21.9* 20.3*  --  21.2*  --   INR  --  1.9* 1.7*  --  1.8*  --   HEPARINUNFRC  --   --   --  0.46  --  0.85*  CREATININE 1.83*  --  1.70*  --  1.48*  --     Estimated Creatinine Clearance: 34.9 mL/min (A) (by C-G formula based on SCr of 1.48 mg/dL (H)).  Assessment: Jason Santana a 89 y.o. male presented with GIB, now s/p EGD 4/18. In EGD op note, GI states it is ok to resume warfarin on 4/19, confirmed OK to restart with TRH. OF note, patient is currently on azithromycin  for possible pneumonia. Given this DDI, which would likely lead to increased INR, recent GIB with diminished PO intake, and INR on last check 2.5 (4/18), will initiate warfarin at lower than PTA dosing and monitor closely.   Anticoagulation PTA: Warfarin PTA dosing: 5 mg M/F, 7.5 mg all other days   Notable DDI's - azithromycin  500 mg 4/18>19 (increase warfarin concentrations).  4/21 AM:  -INR 1.8, remains subtherapeutic.  -Heparin  level 0.82 (supratherapeutic) after initial therapeutic level last night.  Confirmed with RN drawn from opposite arm of heparin  infusion, no s/sx bleeding.   -Hgb drifting down, 8.3 today, pltc 155.   -RN reports poor PO intake, patient eating ~25-50% of his meals.  Offering Ensures (not ordered) but hasn't taken.  If he starts taking, will contribute to slow rise of INR.  Goal of Therapy:  INR 2-3 Heparin  level 0.3-0.7 units/ml Monitor platelets by anticoagulation protocol: Yes   Plan:  Give warfarin 7.5 mg x 1 (another boosted dose from PTA regimen) Decrease heparin  infusion to 1150 units/hr 8h heparin  level  Daily HL/INR/CBC  Monitor for signs of bleeding / issues with heparin  gtt and report to Pharmacist on the unit  Cecillia Cogan, PharmD Clinical Pharmacist 12/23/2023  9:55 AM

## 2023-12-23 NOTE — Progress Notes (Signed)
 Mobility Specialist Progress Note;   12/23/23 1117  Mobility  Activity Transferred from chair to bed  Level of Assistance Contact guard assist, steadying assist  Assistive Device Other (Comment) (HHA)  Distance Ambulated (ft) 5 ft  Activity Response Tolerated well  Mobility Referral Yes  Mobility visit 1 Mobility  Mobility Specialist Start Time (ACUTE ONLY) 1117  Mobility Specialist Stop Time (ACUTE ONLY) 1125  Mobility Specialist Time Calculation (min) (ACUTE ONLY) 8 min   Pt requesting assistance back to bed. Required MinG assistance to transfer back to bed. VSS on 3LO2. Pt left comfortably in bed with all needs met, alarm on.   Janit Meline Mobility Specialist Please contact via SecureChat or [employer] [phone]

## 2023-12-24 ENCOUNTER — Inpatient Hospital Stay (HOSPITAL_COMMUNITY)

## 2023-12-24 ENCOUNTER — Encounter (HOSPITAL_COMMUNITY)
Admission: EM | Disposition: A | Discharge status: Hospice | Payer: Self-pay | Source: Home / Self Care | Attending: Internal Medicine

## 2023-12-24 ENCOUNTER — Ambulatory Visit: Admitting: Cardiology

## 2023-12-24 DIAGNOSIS — D62 Acute posthemorrhagic anemia: Secondary | ICD-10-CM | POA: Diagnosis not present

## 2023-12-24 DIAGNOSIS — K922 Gastrointestinal hemorrhage, unspecified: Secondary | ICD-10-CM | POA: Diagnosis not present

## 2023-12-24 DIAGNOSIS — D649 Anemia, unspecified: Secondary | ICD-10-CM

## 2023-12-24 DIAGNOSIS — I5033 Acute on chronic diastolic (congestive) heart failure: Secondary | ICD-10-CM | POA: Diagnosis not present

## 2023-12-24 DIAGNOSIS — N1831 Chronic kidney disease, stage 3a: Secondary | ICD-10-CM | POA: Diagnosis not present

## 2023-12-24 DIAGNOSIS — E039 Hypothyroidism, unspecified: Secondary | ICD-10-CM | POA: Diagnosis not present

## 2023-12-24 DIAGNOSIS — I5023 Acute on chronic systolic (congestive) heart failure: Secondary | ICD-10-CM | POA: Diagnosis not present

## 2023-12-24 DIAGNOSIS — I480 Paroxysmal atrial fibrillation: Secondary | ICD-10-CM | POA: Diagnosis not present

## 2023-12-24 DIAGNOSIS — K7460 Unspecified cirrhosis of liver: Secondary | ICD-10-CM

## 2023-12-24 DIAGNOSIS — R7881 Bacteremia: Secondary | ICD-10-CM

## 2023-12-24 LAB — BASIC METABOLIC PANEL WITH GFR
Anion gap: 8 (ref 5–15)
BUN: 20 mg/dL (ref 8–23)
CO2: 23 mmol/L (ref 22–32)
Calcium: 8.1 mg/dL — ABNORMAL LOW (ref 8.9–10.3)
Chloride: 106 mmol/L (ref 98–111)
Creatinine, Ser: 1.58 mg/dL — ABNORMAL HIGH (ref 0.61–1.24)
GFR, Estimated: 42 mL/min — ABNORMAL LOW (ref >60)
Glucose, Bld: 95 mg/dL (ref 70–99)
Potassium: 4.2 mmol/L (ref 3.5–5.1)
Sodium: 137 mmol/L (ref 135–145)

## 2023-12-24 LAB — CBC
HCT: 30.2 % — ABNORMAL LOW (ref 39.0–52.0)
Hemoglobin: 9.3 g/dL — ABNORMAL LOW (ref 13.0–17.0)
MCH: 32.2 pg (ref 26.0–34.0)
MCHC: 30.8 g/dL (ref 30.0–36.0)
MCV: 104.5 fL — ABNORMAL HIGH (ref 80.0–100.0)
Platelets: 181 10*3/uL (ref 150–400)
RBC: 2.89 MIL/uL — ABNORMAL LOW (ref 4.22–5.81)
RDW: 15.1 % (ref 11.5–15.5)
WBC: 6.7 10*3/uL (ref 4.0–10.5)
nRBC: 0 % (ref 0.0–0.2)

## 2023-12-24 LAB — PROTIME-INR
INR: 2.5 — ABNORMAL HIGH (ref 0.8–1.2)
Prothrombin Time: 27 s — ABNORMAL HIGH (ref 11.4–15.2)

## 2023-12-24 LAB — HEPARIN LEVEL (UNFRACTIONATED): Heparin Unfractionated: >1.1 [IU]/mL — ABNORMAL HIGH (ref 0.30–0.70)

## 2023-12-24 SURGERY — TRANSESOPHAGEAL ECHOCARDIOGRAM (TEE) (CATHLAB)
Anesthesia: Monitor Anesthesia Care

## 2023-12-24 MED ORDER — WARFARIN SODIUM 7.5 MG PO TABS
7.5000 mg | ORAL_TABLET | Freq: Once | ORAL | Status: AC
  Administered 2023-12-24: 7.5 mg via ORAL
  Filled 2023-12-24: qty 1

## 2023-12-24 MED ORDER — LIDOCAINE 5 % EX PTCH
1.0000 | MEDICATED_PATCH | CUTANEOUS | Status: DC
  Administered 2023-12-24 – 2023-12-28 (×4): 1 via TRANSDERMAL
  Filled 2023-12-24 (×4): qty 1

## 2023-12-24 MED ORDER — METHOCARBAMOL 500 MG PO TABS
500.0000 mg | ORAL_TABLET | Freq: Three times a day (TID) | ORAL | Status: DC | PRN
  Administered 2023-12-24 – 2023-12-28 (×6): 500 mg via ORAL
  Filled 2023-12-24 (×6): qty 1

## 2023-12-24 MED ORDER — HYDROMORPHONE HCL 1 MG/ML IJ SOLN
1.0000 mg | Freq: Once | INTRAMUSCULAR | Status: AC
  Administered 2023-12-24: 1 mg via INTRAVENOUS
  Filled 2023-12-24: qty 1

## 2023-12-24 MED ORDER — MORPHINE SULFATE (PF) 2 MG/ML IV SOLN
3.0000 mg | Freq: Once | INTRAVENOUS | Status: AC | PRN
  Administered 2023-12-24: 3 mg via INTRAVENOUS
  Filled 2023-12-24: qty 2

## 2023-12-24 MED ORDER — NALOXONE HCL 0.4 MG/ML IJ SOLN
0.4000 mg | INTRAMUSCULAR | Status: DC | PRN
Start: 2023-12-24 — End: 2023-12-28

## 2023-12-24 MED ORDER — POTASSIUM CHLORIDE CRYS ER 20 MEQ PO TBCR
20.0000 meq | EXTENDED_RELEASE_TABLET | Freq: Every day | ORAL | Status: DC
  Administered 2023-12-25 – 2023-12-26 (×2): 20 meq via ORAL
  Filled 2023-12-24 (×2): qty 1

## 2023-12-24 MED ORDER — FENTANYL CITRATE PF 50 MCG/ML IJ SOSY
12.5000 ug | PREFILLED_SYRINGE | INTRAMUSCULAR | Status: DC | PRN
  Administered 2023-12-24: 12.5 ug via INTRAVENOUS
  Filled 2023-12-24: qty 1

## 2023-12-24 MED ORDER — FUROSEMIDE 40 MG PO TABS
40.0000 mg | ORAL_TABLET | Freq: Every day | ORAL | Status: DC
Start: 2023-12-24 — End: 2023-12-28
  Administered 2023-12-24 – 2023-12-28 (×5): 40 mg via ORAL
  Filled 2023-12-24 (×5): qty 1

## 2023-12-24 NOTE — Progress Notes (Signed)
 PROGRESS NOTE    Jason Santana  ZOX:[id number] DOB: [birth year] DOA: 12/17/2023 PCP: Laneta Pintos, MD  89/M w CAD, A-fib/flutter and mechanical aortic valve replacement on Coumadin , PVCs, CKD stage IIIa, hypertension, cryptogenic cirrhosis, GERD, hypothyroidism presenting with complaints of shortness of breath and bilateral lower extremity edema.  Reports chronically dark stools. In ED, VSS, labs w/ Hb 6.8, creatinine 1.4 , albumin 2.5,  troponin 255> 251, BNP 252, INR 3.7.  FOBT positive , CXR wcardiomegaly and small left pleural effusion.   Gastroenterology also consulted -Dr. Elvin Hammer with [facility]. Patient was given IV Lasix  40 mg and IV Protonix  80 mg in the ED. 1 unit PRBCs ordered.  Subjective:  Assessment and Plan:  Acute on chronic diastolic CHF (congestive heart failure) (HCC) -Echocardiogram with preserved LV systolic function with EF 60 to 65%, mild LVH, RV systolic function low normal, RVSP 53,7 mmHg. LA with moderate dilation, moderate tricuspid regurgitation, St Jude mechanical valve at the aortic position, with adequate functioning. -appreciate assistance and rec's by cardiology service -will transition to oral lasix  with potassium supplementation -patient instructed to follow-up low-sodium diet, maintain adequate hydration and check weight on daily basis. - Now that his INR is therapeutic, IV heparin  drip will be discontinued and will continue treatment with Coumadin . - Warfarin dosed per pharmacy. - Follow clinical response.   Left lower lobe pneumonia/bacteremia - Continue IV antibiotics - Will follow infectious disease services regarding lymph and route - Pending evaluation with TEE per cardiology service. - 2D echo not demonstrating any vegetations. - Afebrile and normal WBC is currently appreciated. - Repeated blood culture without growth.   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  -Repeat hemoglobin 9.3 - Status post IV iron  and and no signs of overt bleeding - Continue to follow hemoglobin trend.   Paroxysmal atrial flutter (HCC) -Continue rate control with diltiazem  and anticoagulation with warfarin.  - Follow cardiology service for any further recommendation.   CAD (coronary artery disease) of artery bypass graft -No chest pain, no acute coronary syndrome.  - 2D echo demonstrated no wall motion abnormalities.   Cirrhosis of liver without ascites (HCC) -No signs of acute decompensation -Continue to follow LFT's -continue outpatient follow up with GI.    Essential hypertension -Continue blood pressure monitoring.  -Continue with diltiazem  for atrial fibrillation and oral Lasix .   Acute kidney injury on stage 3a chronic kidney disease (HCC) -Stable and at baseline - Continue to follow electrolytes and renal function trend - Oral diuretics has been initiated - Patient reports good urine output.   Hypothyroidism -Continue Synthroid .  DVT prophylaxis: warfarin Code Status: DNR Family Communication: Disposition Plan:   Consultants:    Procedures:   Antimicrobials:    Objective: Vitals:   12/24/23 0020 12/24/23 0301 12/24/23 1042 12/24/23 1600  BP: (!) 124/55 (!) 130/58 128/68 (!) 126/51  Pulse: 83 86 69 75  Resp: 20 20 20  (!) 9  Temp: 98.4 F (36.9 C) 98.6 F (37 C) 98.7 F (37.1 C) 98.7 F (37.1 C)  TempSrc: Oral Oral Oral Oral  SpO2: 93% 94% 97% 96%  Weight:  78 kg    Height:        Intake/Output Summary (Last 24 hours) at 12/24/2023 1621 Last data filed at 12/24/2023 1500 Gross per 24 hour  Intake 632.6 ml  Output 1350 ml  Net -717.4 ml   [employer]  12/22/23 1102 12/23/23 0421 12/24/23 0301  Weight: 79.1 kg 78.6 kg 78 kg    Examination:     Data Reviewed:   CBC: Recent Labs  Lab 12/17/23 2128 12/18/23 0455 12/20/23 0241 12/21/23 0206 12/22/23 0232 12/23/23 0232  12/24/23 0816  WBC 6.0   < > 6.1 5.2 4.8 6.0 6.7  NEUTROABS 3.7  --   --   --  3.1  --   --   HGB 6.8*   < > 8.9* 9.2* 8.9* 8.3* 9.3*  HCT 22.4*   < > 28.2* 29.9* 28.6* 27.5* 30.2*  MCV 112.0*   < > 103.3* 104.2* 105.1* 105.4* 104.5*  PLT 244   < > 192 169 162 155 181   < > = values in this interval not displayed.   Basic Metabolic Panel: Recent Labs  Lab 12/20/23 0241 12/21/23 0206 12/22/23 0232 12/23/23 0232 12/24/23 0816  NA 138 138 137 137 137  K 3.8 4.1 4.1 4.2 4.2  CL 108 106 108 110 106  CO2 20* 23 23 23 23   GLUCOSE 107* 112* 125* 90 95  BUN 23 25* 26* 21 20  CREATININE 1.53* 1.83* 1.70* 1.48* 1.58*  CALCIUM  8.3* 8.2* 8.0* 8.0* 8.1*  PHOS  --   --   --  2.7  --    GFR: Estimated Creatinine Clearance: 32.7 mL/min (A) (by C-G formula based on SCr of 1.58 mg/dL (H)). Liver Function Tests: Recent Labs  Lab 12/17/23 2128 12/18/23 0455 12/23/23 0232  AST 63* 49*  --   ALT 18 20  --   ALKPHOS 84 74  --   BILITOT 0.6 1.1  --   PROT 5.8* 5.9*  --   ALBUMIN 2.5* 2.5* 2.0*   No results for input(s): "LIPASE", "AMYLASE" in the last 168 hours. No results for input(s): "AMMONIA" in the last 168 hours. Coagulation Profile: Recent Labs  Lab 12/20/23 0241 12/21/23 0929 12/22/23 0232 12/23/23 0232 12/24/23 0816  INR 2.5* 1.9* 1.7* 1.8* 2.5*   Cardiac Enzymes: No results for input(s): "CKTOTAL", "CKMB", "CKMBINDEX", "TROPONINI" in the last 168 hours. BNP (last 3 results) No results for input(s): "PROBNP" in the last 8760 hours. HbA1C: No results for input(s): "HGBA1C" in the last 72 hours. CBG: No results for input(s): "GLUCAP" in the last 168 hours. Lipid Profile: No results for input(s): "CHOL", "HDL", "LDLCALC", "TRIG", "CHOLHDL", "LDLDIRECT" in the last 72 hours. Thyroid  Function Tests: No results for input(s): "TSH", "T4TOTAL", "FREET4", "T3FREE", "THYROIDAB" in the last 72 hours. Anemia Panel: No results for input(s): "VITAMINB12", "FOLATE", "FERRITIN",  "TIBC", "IRON ", "RETICCTPCT" in the last 72 hours. Urine analysis:    Component Value Date/Time   COLORURINE STRAW (A) 12/20/2023 1700   APPEARANCEUR CLEAR 12/20/2023 1700   LABSPEC 1.005 12/20/2023 1700   PHURINE 7.0 12/20/2023 1700   GLUCOSEU NEGATIVE 12/20/2023 1700   HGBUR NEGATIVE 12/20/2023 1700   BILIRUBINUR NEGATIVE 12/20/2023 1700   BILIRUBINUR negative 12/15/2021 1002   KETONESUR NEGATIVE 12/20/2023 1700   PROTEINUR NEGATIVE 12/20/2023 1700   UROBILINOGEN 1.0 12/15/2021 1002   NITRITE NEGATIVE 12/20/2023 1700   LEUKOCYTESUR TRACE (A) 12/20/2023 1700   Sepsis Labs: @LABRCNTIP (procalcitonin:4,lacticidven:4)  ) Recent Results (from the past 240 hours)  MRSA Next Gen by PCR, Nasal     Status: None   Collection Time: 12/18/23  4:08 PM   Specimen: Nasal Mucosa; Nasal Swab  Result Value Ref Range Status   MRSA by PCR Next Gen NOT DETECTED NOT DETECTED Final  Comment: (NOTE) The GeneXpert MRSA Assay (FDA approved for NASAL specimens only), is one component of a comprehensive MRSA colonization surveillance program. It is not intended to diagnose MRSA infection nor to guide or monitor treatment for MRSA infections. Test performance is not FDA approved in patients less than 50 years old. Performed at [facility], [address] [place], [place] [address]   Culture, blood (Routine X 2) w Reflex to ID Panel     Status: Abnormal   Collection Time: 12/20/23 11:26 AM   Specimen: BLOOD LEFT ARM  Result Value Ref Range Status   Specimen Description BLOOD LEFT ARM  Final   Special Requests   Final    BOTTLES DRAWN AEROBIC AND ANAEROBIC Blood Culture adequate volume   Culture  Setup Time   Final    GRAM POSITIVE COCCI IN CHAINS ANAEROBIC BOTTLE ONLY CRITICAL RESULT CALLED TO, READ BACK BY AND VERIFIED WITH: PHARMD J LEDFORD 12/21/2023 @ 0412 BY AB Performed at [facility], [address], [place], [place] [address]    Culture STREPTOCOCCUS MITIS/ORALIS (A)   Final   Report Status 12/23/2023 FINAL  Final   Organism ID, Bacteria STREPTOCOCCUS MITIS/ORALIS  Final      Susceptibility   Streptococcus mitis/oralis - MIC*    PENICILLIN <=0.06 SENSITIVE Sensitive     CEFTRIAXONE  <=0.12 SENSITIVE Sensitive     LEVOFLOXACIN  0.5 SENSITIVE Sensitive     VANCOMYCIN 0.5 SENSITIVE Sensitive     * STREPTOCOCCUS MITIS/ORALIS  Blood Culture ID Panel (Reflexed)     Status: Abnormal   Collection Time: 12/20/23 11:26 AM  Result Value Ref Range Status   Enterococcus faecalis NOT DETECTED NOT DETECTED Final   Enterococcus Faecium NOT DETECTED NOT DETECTED Final   Listeria monocytogenes NOT DETECTED NOT DETECTED Final   Staphylococcus species NOT DETECTED NOT DETECTED Final   Staphylococcus aureus (BCID) NOT DETECTED NOT DETECTED Final   Staphylococcus epidermidis NOT DETECTED NOT DETECTED Final   Staphylococcus lugdunensis NOT DETECTED NOT DETECTED Final   Streptococcus species DETECTED (A) NOT DETECTED Final    Comment: Not Enterococcus species, Streptococcus agalactiae, Streptococcus pyogenes, or Streptococcus pneumoniae. CRITICAL RESULT CALLED TO, READ BACK BY AND VERIFIED WITH: PHARMD J LEDFORD 12/21/2023 @ 0412 BY AB    Streptococcus agalactiae NOT DETECTED NOT DETECTED Final   Streptococcus pneumoniae NOT DETECTED NOT DETECTED Final   Streptococcus pyogenes NOT DETECTED NOT DETECTED Final   A.calcoaceticus-baumannii NOT DETECTED NOT DETECTED Final   Bacteroides fragilis NOT DETECTED NOT DETECTED Final   Enterobacterales NOT DETECTED NOT DETECTED Final   Enterobacter cloacae complex NOT DETECTED NOT DETECTED Final   Escherichia coli NOT DETECTED NOT DETECTED Final   Klebsiella aerogenes NOT DETECTED NOT DETECTED Final   Klebsiella oxytoca NOT DETECTED NOT DETECTED Final   Klebsiella pneumoniae NOT DETECTED NOT DETECTED Final   Proteus species NOT DETECTED NOT DETECTED Final   Salmonella species NOT DETECTED NOT DETECTED Final   Serratia marcescens NOT  DETECTED NOT DETECTED Final   Haemophilus influenzae NOT DETECTED NOT DETECTED Final   Neisseria meningitidis NOT DETECTED NOT DETECTED Final   Pseudomonas aeruginosa NOT DETECTED NOT DETECTED Final   Stenotrophomonas maltophilia NOT DETECTED NOT DETECTED Final   Candida albicans NOT DETECTED NOT DETECTED Final   Candida auris NOT DETECTED NOT DETECTED Final   Candida glabrata NOT DETECTED NOT DETECTED Final   Candida krusei NOT DETECTED NOT DETECTED Final   Candida parapsilosis NOT DETECTED NOT DETECTED Final   Candida tropicalis  NOT DETECTED NOT DETECTED Final   Cryptococcus neoformans/gattii NOT DETECTED NOT DETECTED Final    Comment: Performed at [facility], [address] [place], [place] [address]  Culture, blood (Routine X 2) w Reflex to ID Panel     Status: None (Preliminary result)   Collection Time: 12/20/23 11:27 AM   Specimen: BLOOD RIGHT ARM  Result Value Ref Range Status   Specimen Description BLOOD RIGHT ARM  Final   Special Requests   Final    BOTTLES DRAWN AEROBIC AND ANAEROBIC Blood Culture adequate volume   Culture   Final    NO GROWTH 4 DAYS Performed at [facility], [address], [place], [place] [address]    Report Status PENDING  Incomplete  Culture, blood (Routine X 2) w Reflex to ID Panel     Status: None (Preliminary result)   Collection Time: 12/22/23  2:32 AM   Specimen: BLOOD RIGHT HAND  Result Value Ref Range Status   Specimen Description BLOOD RIGHT HAND  Final   Special Requests   Final    BOTTLES DRAWN AEROBIC AND ANAEROBIC Blood Culture adequate volume   Culture   Final    NO GROWTH 2 DAYS Performed at [facility], [address] [place], [place] [address]    Report Status PENDING  Incomplete  Culture, blood (Routine X 2) w Reflex to ID Panel     Status: None (Preliminary result)   Collection Time: 12/22/23  2:44 AM   Specimen: BLOOD LEFT ARM  Result Value Ref Range Status   Specimen Description BLOOD LEFT ARM  Final    Special Requests   Final    BOTTLES DRAWN AEROBIC AND ANAEROBIC Blood Culture adequate volume   Culture   Final    NO GROWTH 2 DAYS Performed at [facility], [address] [place], [place] [address]    Report Status PENDING  Incomplete     Radiology Studies: ECHOCARDIOGRAM LIMITED Result Date: 12/23/2023    ECHOCARDIOGRAM LIMITED REPORT   Patient Name:   Jason Santana Date of Exam: 12/23/2023 Medical Rec #:  [id number]    Height:       70.0 in Accession #:    [id number]   Weight:       173.3 lb Date of Birth:  13-Sep-1931   BSA:          1.964 m Patient Age:    89 years     BP:           125/54 mmHg Patient Gender: M            HR:           73 bpm. Exam Location:  Inpatient Procedure: Limited Echo, Color Doppler and Cardiac Doppler (Both Spectral and            Color Flow Doppler were utilized during procedure). Indications:    Fever R50.9  History:        Patient has prior history of Echocardiogram examinations, most                 recent 10/15/2023. CHF, CAD, Arrythmias:Atrial Fibrillation; Risk                 Factors:Hypertension.  Sonographer:    Sherline Distel Senior RDCS Referring Phys: [id number] MAURICIO DANIEL ARRIEN IMPRESSIONS  1. Left ventricular ejection fraction, by estimation, is 60 to 65%. The left ventricle has normal function. Left ventricular diastolic  function could not be evaluated.  2. Right ventricular systolic function is low normal. The right ventricular size is mildly enlarged. There is mildly elevated pulmonary artery systolic pressure. The estimated right ventricular systolic pressure is 40.3 mmHg.  3. Left atrial size was moderately dilated.  4. Right atrial size was moderately dilated.  5. The mitral valve is abnormal. Trivial mitral valve regurgitation. Moderate mitral annular calcification.  6. The tricuspid valve is abnormal. Tricuspid valve regurgitation is moderate.  7. The aortic valve has been repaired/replaced. Aortic valve regurgitation is trivial. No aortic stenosis  is present. Procedure Date: 2000. Echo findings are consistent with normal structure and function of the aortic valve prosthesis. Aortic valve mean  gradient measures 7.0 mmHg. Aortic valve Vmax measures 1.74 m/s.  8. The inferior vena cava is normal in size with <50% respiratory variability, suggesting right atrial pressure of 8 mmHg. Conclusion(s)/Recommendation(s): No evidence of valvular vegetations on this transthoracic echocardiogram. Consider a transesophageal echocardiogram to exclude infective endocarditis if clinically indicated. Limited echo to assess for endocarditis. FINDINGS  Left Ventricle: Left ventricular ejection fraction, by estimation, is 60 to 65%. The left ventricle has normal function. Left ventricular diastolic function could not be evaluated. Right Ventricle: The right ventricular size is mildly enlarged. Right ventricular systolic function is low normal. There is mildly elevated pulmonary artery systolic pressure. The tricuspid regurgitant velocity is 2.84 m/s, and with an assumed right atrial pressure of 8 mmHg, the estimated right ventricular systolic pressure is 40.3 mmHg. Left Atrium: Left atrial size was moderately dilated. Right Atrium: Right atrial size was moderately dilated. Pericardium: There is no evidence of pericardial effusion. Mitral Valve: The mitral valve is abnormal. Moderate mitral annular calcification. Trivial mitral valve regurgitation. Tricuspid Valve: The tricuspid valve is abnormal. Tricuspid valve regurgitation is moderate. Aortic Valve: The aortic valve has been repaired/replaced. Aortic valve regurgitation is trivial. No aortic stenosis is present. Aortic valve mean gradient measures 7.0 mmHg. Aortic valve peak gradient measures 12.1 mmHg. There is a St. Jude bioprosthetic mechanical valve present in the aortic position. Echo findings are consistent with normal structure and function of the aortic valve prosthesis. Pulmonic Valve: The pulmonic valve was normal in  structure. Venous: The inferior vena cava is normal in size with less than 50% respiratory variability, suggesting right atrial pressure of 8 mmHg. Additional Comments: Spectral Doppler performed. Color Doppler performed.  AORTIC VALVE AV Vmax:           174.00 cm/s AV Vmean:          128.000 cm/s AV VTI:            0.399 m AV Peak Grad:      12.1 mmHg AV Mean Grad:      7.0 mmHg LVOT Vmax:         65.70 cm/s LVOT Vmean:        47.900 cm/s LVOT VTI:          0.158 m LVOT/AV VTI ratio: 0.40 TRICUSPID VALVE TR Peak grad:   32.3 mmHg TR Vmax:        284.00 cm/s  SHUNTS Systemic VTI: 0.16 m Jules Oar MD Electronically signed by Jules Oar MD Signature Date/Time: 12/23/2023/10:38:41 AM    Final      Scheduled Meds:  dextromethorphan -guaiFENesin   2 tablet Oral BID   diltiazem   120 mg Oral Daily   feeding supplement  237 mL Oral BID BM   furosemide   40 mg Oral Daily   levothyroxine   125  mcg Oral QAC breakfast   pantoprazole   40 mg Oral BID   [START ON 12/25/2023] potassium chloride   20 mEq Oral Daily   Warfarin - Pharmacist Dosing Inpatient   Does not apply q1600   Continuous Infusions:  cefTRIAXone  (ROCEPHIN )  IV 2 g (12/24/23 1551)     LOS: 7 days    Time spent:    Deforest Fast, MD [facility]   12/24/2023, 4:21 PM

## 2023-12-24 NOTE — Progress Notes (Signed)
 Pharmacy Consult for Warfarin w/ heparin  gtt bridge Indication: atrial fibrillation and mAVR  Allergies  Allergen Reactions   Carvedilol  Shortness Of Breath    wheezing   Lopid [Gemfibrozil] Other (See Comments)    transaminitis   Monopril [Fosinopril] Other (See Comments)    transaminitis   Vicodin [Hydrocodone-Acetaminophen ]     Severe sensitivity   Phenergan [Promethazine Hcl] Other (See Comments)    Severe somnolence.   Zocor [Simvastatin]     Short memory loss.   Benicar [Olmesartan]     Abdominal cramping and increased stools/ irritable bowel   Codeine Nausea And Vomiting   Prilosec [Omeprazole]     dyspepsia    Patient Measurements: Height: 5\' 10"  (177.8 cm) Weight: 78 kg (171 lb 15.3 oz) IBW/kg (Calculated) : 73 HEPARIN  DW (KG): 82.3  Vital Signs: Temp: 98.6 F (37 C) (04/22 0301) Temp Source: Oral (04/22 0301) BP: 130/58 (04/22 0301) Pulse Rate: 86 (04/22 0301)  Labs: Recent Labs    12/22/23 0232 12/22/23 1732 12/23/23 0232 12/23/23 0836 12/23/23 1836 12/24/23 0816  HGB 8.9*  --  8.3*  --   --  9.3*  HCT 28.6*  --  27.5*  --   --  30.2*  PLT 162  --  155  --   --  181  LABPROT 20.3*  --  21.2*  --   --  27.0*  INR 1.7*  --  1.8*  --   --  2.5*  HEPARINUNFRC  --    < >  --  0.85* 0.80* >1.10*  CREATININE 1.70*  --  1.48*  --   --  1.58*   < > = values in this interval not displayed.    Estimated Creatinine Clearance: 32.7 mL/min (A) (by C-G formula based on SCr of 1.58 mg/dL (H)).  Assessment: Jason Santana a 89 y.o. male presented with GIB, now s/p EGD 4/18. In EGD op note, GI states it is ok to resume warfarin on 4/19, confirmed OK to restart with TRH. OF note, patient is currently on azithromycin  for possible pneumonia. Given this DDI, which would likely lead to increased INR, recent GIB with diminished PO intake, and INR on last check 2.5 (4/18), will initiate warfarin at lower than PTA dosing and monitor closely.   Anticoagulation PTA: Warfarin  PTA dosing: 5 mg M/F, 7.5 mg all other days  Notable DDI's - azithromycin  500 mg 4/18>19 (increase warfarin concentrations).  Heparin  level came back supratherapeutic multiple times despite rate adjustments. Hgb up 9.3, plt 181. Confirmed drawn correctly. INR now therapeutic. No s/sx of bleeding or infusion issues. Will stop heparin  infusion to prevent bleeding complications.  Goal of Therapy:  INR 2-3 Heparin  level 0.3-0.7 units/ml Monitor platelets by anticoagulation protocol: Yes   Plan:  Stop heparin  infusion (INR therapeutic) Give warfarin 7.5 mg PO x1 dose Check INR daily while on warfarin Continue to monitor H&H and platelets  Thank you for allowing pharmacy to be a part of this patient's care.  Claudia Cuff, PharmD, BCPS Clinical Pharmacist

## 2023-12-24 NOTE — Progress Notes (Signed)
 Progress Note   Patient: Jason Santana ZOX:[id number] DOB: [birth year] DOA: 12/17/2023     7 DOS: the patient was seen and examined on 12/24/2023   Brief hospital admission narrative: As per H&P written by Dr. Michell Ahumada on 12/18/2023 Jason Santana is a 89 y.o. male with medical history significant of CAD, A-fib/flutter and mechanical aortic valve replacement on Coumadin , PVCs, CKD stage IIIa, hypertension, cryptogenic cirrhosis, GERD, hypothyroidism presenting with complaints of shortness of breath and bilateral lower extremity edema.  Patient states his doctor had reduced the dose of his home Lasix  from 40 mg daily to 20 mg daily a month ago.  Since then he is having progressively worsening dyspnea on exertion and bilateral lower extremity edema.  Denies chest pain.  Denies hematemesis, hematochezia, or hematuria.  Reports chronically dark stools due to taking an iron  supplement.  Denies recent change in his Coumadin  dose.  Denies alcohol or NSAID use.  He is reporting generalized weakness.   ED Course: Vital signs on arrival: Temperature 97.5 F, pulse 83, respiratory rate 18, blood pressure 135/47, SpO2 96% on room air.  Labs notable for hemoglobin 6.8 (previously 9.4 on 10/03/2023), MCV 112.0, creatinine 1.4 (stable since labs done a month ago), calcium  8.4, albumin 2.5, AST 63 (slightly elevated on labs over 2 months ago as well), ALT/alk phos/T. bili normal, troponin 255> 251, BNP 252, INR 3.7.  FOBT positive but patient had light brown stool and no melena or hematochezia noted on rectal exam.  Chest x-ray showing cardiomegaly and small left pleural effusion.  EKG showing sinus rhythm and T wave inversions in leads I and aVL.  Previous EKG from February 2025 was showing mild ST changes in leads I and aVL.  ED physician discussed the case with cardiologist Dr. Hayes Lipps who feels ACS is less likely and troponin elevation likely due to demand ischemia.  Gastroenterology also consulted -Dr. Elvin Hammer with [facility]. Patient was given IV Lasix  40 mg and IV Protonix  80 mg in the ED. 1 unit PRBCs ordered.  Assessment and plan Acute on chronic diastolic CHF (congestive heart failure) (HCC) -Echocardiogram with preserved LV systolic function with EF 60 to 65%, mild LVH, RV systolic function low normal, RVSP 53,7 mmHg. LA with moderate dilation, moderate tricuspid regurgitation, St Jude mechanical valve at the aortic position, with adequate functioning. -appreciate assistance and rec's by cardiology service -will transition to oral lasix  with potassium supplementation -patient instructed to follow-up low-sodium diet, maintain adequate hydration and check weight on daily basis. - Now that his INR is therapeutic, IV heparin  drip will be discontinued and will continue treatment with Coumadin . - Warfarin dosed per pharmacy. - Follow clinical response.  Left lower lobe pneumonia/bacteremia - Continue IV antibiotics - Will follow infectious disease services regarding lymph and route - Pending evaluation with TEE per cardiology service. - 2D echo not demonstrating any vegetations. - Afebrile and normal WBC is currently appreciated. - Repeated blood culture without growth.  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  -Repeat hemoglobin 9.3 on today's CBC - Status post IV iron  and and no signs of overt bleeding - Continue to follow hemoglobin trend.  Paroxysmal atrial flutter (HCC) -Continue rate control with diltiazem  and anticoagulation with warfarin.  - Follow cardiology service for any further recommendation.  CAD (coronary artery disease) of artery bypass graft -No chest pain, no acute coronary syndrome.  - 2D echo demonstrated no  wall motion abnormalities.  Cirrhosis of liver without ascites (HCC) -No signs of acute decompensation -Continue to follow LFT's -continue outpatient follow up  with GI.   Essential hypertension -Continue blood pressure monitoring.  -Continue with diltiazem  for atrial fibrillation and oral Lasix .  Acute kidney injury on stage 3a chronic kidney disease (HCC) -Stable and at baseline - Continue to follow electrolytes and renal function trend - Oral diuretics has been initiated - Patient reports good urine output.  Hypothyroidism -Continue Synthroid .  Subjective:  No chest pain, no nausea, no vomiting.  Reports stable breathing.  Currently afebrile and in no acute distress.  Physical Exam: Vitals:   12/23/23 2018 12/24/23 0020 12/24/23 0301 12/24/23 1042  BP: 130/61 (!) 124/55 (!) 130/58 128/68  Pulse: 81 83 86 69  Resp: 19 20 20 20   Temp: 98.1 F (36.7 C) 98.4 F (36.9 C) 98.6 F (37 C) 98.7 F (37.1 C)  TempSrc: Oral Oral Oral Oral  SpO2: 96% 93% 94% 97%  Weight:   78 kg   Height:       General exam: Alert, awake, oriented x 3; no fever, no chest pain. Respiratory system: No using accessory muscles; good saturation on 2 L supplementation appreciated. Cardiovascular system: Rate controlled, no rubs, no gallops, no JVD. Gastrointestinal system: Abdomen is nondistended, soft and nontender. No organomegaly or masses felt. Normal bowel sounds heard. Central nervous system: No focal neurological deficits. Extremities: No cyanosis or clubbing; trace edema appreciated bilaterally. Skin: No petechiae. Psychiatry: Judgement and insight appear normal. Mood & affect appropriate.   Data Reviewed: CBC: White blood cell 6.7, hemoglobin 9.3, platelet count 1 81K Basic metabolic panel: Sodium 137, potassium 4.2, chloride 106, bicarb 23, BUN 20, creatinine 1.5 GFR 42 INR: 2.5  prothrombin time: 32  Family Communication: No family at bedside.  Disposition: Status is: Inpatient Remains inpatient appropriate because: IV antibiotics.  Time spent: 55 minutes  Author: Justina Oman, MD 12/24/2023 1:55 PM  For on call review  [email or web address].

## 2023-12-24 NOTE — Progress Notes (Signed)
 Occupational Therapy Treatment Patient Details Name: Jason Santana MRN: [id number] DOB: 22-Feb-1932 Today's Date: 12/24/2023   History of present illness Pt is an 89 y/o M presenting to ED on 4/15 wtih BLE edema x1 week and worsening dyspnea, admitted for acute GI bleed. CXR with L basilar opacity favoring combination of L pleural effusion, L lung atelectasis. 4/18 EGD and upper GI endoscopy. PMH includes A fib, A flutter, AVR, PVCs, CKD IIIA, HTN, cryptogenic cirrhosis, GERD, hypothryoidism   OT comments  Pt received in supine and agreeable to therapy. Able to get EOB with supervision using bed railings and HOB elevated, cues needed to scoot hips forward. Pt able to stand with CGA for safety and ambulate to sink, pt stood at sink for 10+ min completing grooming/self care and UB ADL, no LOB noted and VSS stable throughout. Pt returned to recliner with all needs met. Acute OT to continue to follow to address established goals to facilitate DC to next venue of care.        If plan is discharge home, recommend the following:  A little help with walking and/or transfers;A little help with bathing/dressing/bathroom;Assistance with cooking/housework;Assist for transportation;Help with stairs or ramp for entrance   Equipment Recommendations  Tub/shower seat;Other (comment)    Recommendations for Other Services      Precautions / Restrictions Precautions Precautions: Fall Recall of Precautions/Restrictions: Intact Restrictions Weight Bearing Restrictions Per Provider Order: No       Mobility Bed Mobility Overal bed mobility: Needs Assistance Bed Mobility: Supine to Sit     Supine to sit: Supervision, Used rails, HOB elevated     General bed mobility comments: assist initially to manage lines before moving, pt able to get self EOB and required cueing to scoot hips forward to get feet touching ground    Transfers Overall transfer level: Needs assistance Equipment used: Rolling walker  (2 wheels) Transfers: Sit to/from Stand Sit to Stand: Contact guard assist           General transfer comment: CGA for safety     Balance Overall balance assessment: Needs assistance Sitting-balance support: Feet supported Sitting balance-Leahy Scale: Good Sitting balance - Comments: EOB   Standing balance support: Single extremity supported, Reliant on assistive device for balance, During functional activity Standing balance-Leahy Scale: Poor Standing balance comment: Reliant on external support and RW when ambulating, while standing at sink pt between 0-1 extremity assist                           ADL either performed or assessed with clinical judgement   ADL Overall ADL's : Needs assistance/impaired     Grooming: Wash/dry hands;Oral care;Supervision/safety;Standing Grooming Details (indicate cue type and reason): stood at sink with close supervision for grooming, maintained balance and stable throughout         Upper Body Dressing : Contact guard assist;Minimal assistance;Standing Upper Body Dressing Details (indicate cue type and reason): assistance provided to manage getting gown buttoned around IV and lines, otherwise, able to to don gown with CGA for safety while standing                 Functional mobility during ADLs: Contact guard assist;+2 for safety/equipment General ADL Comments: completed in standing at sink with supervision-CGA for safety, stable and supported with RW and external supports, declined other ADLs but pleasant and eager to do what he could    Extremity/Trunk Assessment  Vision       Perception     Praxis     Communication Communication Communication: No apparent difficulties   Cognition Arousal: Alert Behavior During Therapy: WFL for tasks assessed/performed Cognition: No apparent impairments                               Following commands: Intact        Cueing   Cueing  Techniques: Verbal cues  Exercises      Shoulder Instructions       General Comments VSS on 3L O2    Pertinent Vitals/ Pain       Pain Assessment Pain Assessment: Faces Faces Pain Scale: Hurts a little bit Pain Location: Rt thigh cramp Pain Descriptors / Indicators: Tightness, Sore Pain Intervention(s): Monitored during session, Limited activity within patient's tolerance  Home Living                                          Prior Functioning/Environment              Frequency  Min 2X/week        Progress Toward Goals  OT Goals(current goals can now be found in the care plan section)  Progress towards OT goals: Progressing toward goals  Acute Rehab OT Goals Patient Stated Goal: none stated OT Goal Formulation: With patient Time For Goal Achievement: 01/04/24 Potential to Achieve Goals: Good ADL Goals Pt Will Perform Upper Body Dressing: with modified independence;sitting Pt Will Perform Lower Body Dressing: with modified independence;sitting/lateral leans;sit to/from stand Pt Will Transfer to Toilet: with modified independence;ambulating;regular height toilet Pt Will Perform Tub/Shower Transfer: Shower transfer;with modified independence;ambulating Additional ADL Goal #1: pt will tolerate OOB activity x10 min wtih SpO2 above 90% in prep for ADLs  Plan      Co-evaluation                 AM-PAC OT "6 Clicks" Daily Activity     Outcome Measure   Help from another person eating meals?: None Help from another person taking care of personal grooming?: A Little Help from another person toileting, which includes using toliet, bedpan, or urinal?: A Little Help from another person bathing (including washing, rinsing, drying)?: A Little Help from another person to put on and taking off regular upper body clothing?: A Little Help from another person to put on and taking off regular lower body clothing?: A Little 6 Click Score: 19    End  of Session Equipment Utilized During Treatment: Rolling walker (2 wheels);Gait belt  OT Visit Diagnosis: Unsteadiness on feet (R26.81);Other abnormalities of gait and mobility (R26.89);Muscle weakness (generalized) (M62.81)   Activity Tolerance Patient tolerated treatment well   Patient Left in chair;with call bell/phone within reach;with chair alarm set   Nurse Communication Mobility status        Time: 2130-8657 OT Time Calculation (min): 24 min  Charges: OT General Charges $OT Visit: 1 Visit OT Treatments $Self Care/Home Management : 23-37 mins  Dvon Jiles, BS, OTA/S   Beau [place] 12/24/2023, 10:42 AM

## 2023-12-24 NOTE — Progress Notes (Signed)
 Patient stating that he was having 10/10 right upper thigh/Hip pain. He said it started early this morning as a cramp but progressively got worse. He states that he did not fall or pull anything when he ambulated yesterday. Paged Dr. Michaelene Admire got orders for robaxin  with no improvement. Patient stated that his pain was pretty bad and it was making him nauseas. IV zofran  given and paged MD, got orders for one time dose of iv dilaudid .    Patient was able to nap and said it only hurts with movement versus ongoing pain.

## 2023-12-24 NOTE — Plan of Care (Signed)
  Problem: Education: Goal: Knowledge of General Education information will improve Description: Including pain rating scale, medication(s)/side effects and non-pharmacologic comfort measures Outcome: Progressing   Problem: Health Behavior/Discharge Planning: Goal: Ability to manage health-related needs will improve Outcome: Progressing   Problem: Clinical Measurements: Goal: Ability to maintain clinical measurements within normal limits will improve Outcome: Progressing Goal: Will remain free from infection Outcome: Progressing Goal: Diagnostic test results will improve Outcome: Progressing Goal: Respiratory complications will improve Outcome: Progressing Goal: Cardiovascular complication will be avoided Outcome: Progressing  Problem: Coping: Goal: Level of anxiety will decrease Outcome: Progressing   Problem: Elimination: Goal: Will not experience complications related to bowel motility Outcome: Progressing Goal: Will not experience complications related to urinary retention Outcome: Progressing   Problem: Safety: Goal: Ability to remain free from injury will improve Outcome: Progressing   Problem: Skin Integrity: Goal: Risk for impaired skin integrity will decrease Outcome: Progressing   Problem: Education: Goal: Ability to demonstrate management of disease process will improve Outcome: Progressing Goal: Ability to verbalize understanding of medication therapies will improve Outcome: Progressing Goal: Individualized Educational Video(s) Outcome: Progressing   Problem: Cardiac: Goal: Ability to achieve and maintain adequate cardiopulmonary perfusion will improve Outcome: Progressing

## 2023-12-24 NOTE — Progress Notes (Signed)
 Progress Note  Patient Name: Jason Santana Date of Encounter: 12/24/2023  Primary Cardiologist: Dorothye Gathers, MD   Subjective   Patient seen and examined by his bedside.    Inpatient Medications    Scheduled Meds:  dextromethorphan -guaiFENesin   2 tablet Oral BID   diltiazem   120 mg Oral Daily   feeding supplement  237 mL Oral BID BM   levothyroxine   125 mcg Oral QAC breakfast   pantoprazole   40 mg Oral BID   Warfarin - Pharmacist Dosing Inpatient   Does not apply q1600   Continuous Infusions:  cefTRIAXone  (ROCEPHIN )  IV Stopped (12/23/23 1538)   heparin  1,000 Units/hr (12/24/23 0018)   PRN Meds: acetaminophen , ipratropium-albuterol , loratadine , melatonin, ondansetron  (ZOFRAN ) IV   Vital Signs    Vitals:   12/23/23 1800 12/23/23 2018 12/24/23 0020 12/24/23 0301  BP:  130/61 (!) 124/55 (!) 130/58  Pulse: 79 81 83 86  Resp: (!) 24 19 20 20   Temp:  98.1 F (36.7 C) 98.4 F (36.9 C) 98.6 F (37 C)  TempSrc:  Oral Oral Oral  SpO2: 96% 96% 93% 94%  Weight:    78 kg  Height:        Intake/Output Summary (Last 24 hours) at 12/24/2023 0912 Last data filed at 12/24/2023 0500 Gross per 24 hour  Intake 806.07 ml  Output 1900 ml  Net -1093.93 ml   Filed Weights   12/22/23 1102 12/23/23 0421 12/24/23 0301  Weight: 79.1 kg 78.6 kg 78 kg    Telemetry    Sinu rhythm - Personally Reviewed  ECG     None today- Personally Reviewed  Physical Exam     General: Comfortable Head: Atraumatic, normal size  Eyes: PEERLA, EOMI  Neck: Supple, normal JVD Cardiac: Normal S1, S2; RRR; no murmurs, rubs, or gallops Lungs: Clear to auscultation bilaterally Abd: Soft, nontender, no hepatomegaly  Ext: warm, no edema Musculoskeletal: No deformities, BUE and BLE strength normal and equal Skin: Warm and dry, no rashes   Neuro: Alert and oriented to person, place, time, and situation, CNII-XII grossly intact, no focal deficits  Psych: Normal mood and affect   Labs     Chemistry Recent Labs  Lab 12/17/23 2128 12/18/23 0455 12/19/23 0237 12/21/23 0206 12/22/23 0232 12/23/23 0232  NA 138 139   < > 138 137 137  K 4.4 3.7   < > 4.1 4.1 4.2  CL 112* 112*   < > 106 108 110  CO2 21* 21*   < > 23 23 23   GLUCOSE 133* 109*   < > 112* 125* 90  BUN 27* 24*   < > 25* 26* 21  CREATININE 1.46* 1.48*   < > 1.83* 1.70* 1.48*  CALCIUM  8.4* 8.2*   < > 8.2* 8.0* 8.0*  PROT 5.8* 5.9*  --   --   --   --   ALBUMIN 2.5* 2.5*  --   --   --  2.0*  AST 63* 49*  --   --   --   --   ALT 18 20  --   --   --   --   ALKPHOS 84 74  --   --   --   --   BILITOT 0.6 1.1  --   --   --   --   GFRNONAA 46* 45*   < > 35* 38* 45*  ANIONGAP 5 6   < > 9 6 4*   < > =  values in this interval not displayed.     Hematology Recent Labs  Lab 12/22/23 0232 12/23/23 0232 12/24/23 0816  WBC 4.8 6.0 6.7  RBC 2.72* 2.61* 2.89*  HGB 8.9* 8.3* 9.3*  HCT 28.6* 27.5* 30.2*  MCV 105.1* 105.4* 104.5*  MCH 32.7 31.8 32.2  MCHC 31.1 30.2 30.8  RDW 15.5 15.1 15.1  PLT 162 155 181    Cardiac EnzymesNo results for input(s): "TROPONINI" in the last 168 hours. No results for input(s): "TROPIPOC" in the last 168 hours.   BNP Recent Labs  Lab 12/17/23 2128  BNP 252.6*     DDimer No results for input(s): "DDIMER" in the last 168 hours.   Radiology    ECHOCARDIOGRAM LIMITED Result Date: 12/23/2023    ECHOCARDIOGRAM LIMITED REPORT   Patient Name:   Jason Santana Date of Exam: 12/23/2023 Medical Rec #:  [id number]    Height:       70.0 in Accession #:    [id number]   Weight:       173.3 lb Date of Birth:  [birth year]   BSA:          1.964 m Patient Age:    89 years     BP:           125/54 mmHg Patient Gender: M            HR:           73 bpm. Exam Location:  Inpatient Procedure: Limited Echo, Color Doppler and Cardiac Doppler (Both Spectral and            Color Flow Doppler were utilized during procedure). Indications:    Fever R50.9  History:        Patient has prior history of Echocardiogram  examinations, most                 recent 10/15/2023. CHF, CAD, Arrythmias:Atrial Fibrillation; Risk                 Factors:Hypertension.  Sonographer:    Sherline Distel Senior RDCS Referring Phys: [id number] MAURICIO DANIEL ARRIEN IMPRESSIONS  1. Left ventricular ejection fraction, by estimation, is 60 to 65%. The left ventricle has normal function. Left ventricular diastolic function could not be evaluated.  2. Right ventricular systolic function is low normal. The right ventricular size is mildly enlarged. There is mildly elevated pulmonary artery systolic pressure. The estimated right ventricular systolic pressure is 40.3 mmHg.  3. Left atrial size was moderately dilated.  4. Right atrial size was moderately dilated.  5. The mitral valve is abnormal. Trivial mitral valve regurgitation. Moderate mitral annular calcification.  6. The tricuspid valve is abnormal. Tricuspid valve regurgitation is moderate.  7. The aortic valve has been repaired/replaced. Aortic valve regurgitation is trivial. No aortic stenosis is present. Procedure Date: 2000. Echo findings are consistent with normal structure and function of the aortic valve prosthesis. Aortic valve mean  gradient measures 7.0 mmHg. Aortic valve Vmax measures 1.74 m/s.  8. The inferior vena cava is normal in size with <50% respiratory variability, suggesting right atrial pressure of 8 mmHg. Conclusion(s)/Recommendation(s): No evidence of valvular vegetations on this transthoracic echocardiogram. Consider a transesophageal echocardiogram to exclude infective endocarditis if clinically indicated. Limited echo to assess for endocarditis. FINDINGS  Left Ventricle: Left ventricular ejection fraction, by estimation, is 60 to 65%. The left ventricle has normal function. Left ventricular diastolic function could not be evaluated. Right Ventricle: The right ventricular size is mildly enlarged. Right  ventricular systolic function is low normal. There is mildly elevated pulmonary artery  systolic pressure. The tricuspid regurgitant velocity is 2.84 m/s, and with an assumed right atrial pressure of 8 mmHg, the estimated right ventricular systolic pressure is 40.3 mmHg. Left Atrium: Left atrial size was moderately dilated. Right Atrium: Right atrial size was moderately dilated. Pericardium: There is no evidence of pericardial effusion. Mitral Valve: The mitral valve is abnormal. Moderate mitral annular calcification. Trivial mitral valve regurgitation. Tricuspid Valve: The tricuspid valve is abnormal. Tricuspid valve regurgitation is moderate. Aortic Valve: The aortic valve has been repaired/replaced. Aortic valve regurgitation is trivial. No aortic stenosis is present. Aortic valve mean gradient measures 7.0 mmHg. Aortic valve peak gradient measures 12.1 mmHg. There is a St. Jude bioprosthetic mechanical valve present in the aortic position. Echo findings are consistent with normal structure and function of the aortic valve prosthesis. Pulmonic Valve: The pulmonic valve was normal in structure. Venous: The inferior vena cava is normal in size with less than 50% respiratory variability, suggesting right atrial pressure of 8 mmHg. Additional Comments: Spectral Doppler performed. Color Doppler performed.  AORTIC VALVE AV Vmax:           174.00 cm/s AV Vmean:          128.000 cm/s AV VTI:            0.399 m AV Peak Grad:      12.1 mmHg AV Mean Grad:      7.0 mmHg LVOT Vmax:         65.70 cm/s LVOT Vmean:        47.900 cm/s LVOT VTI:          0.158 m LVOT/AV VTI ratio: 0.40 TRICUSPID VALVE TR Peak grad:   32.3 mmHg TR Vmax:        284.00 cm/s  SHUNTS Systemic VTI: 0.16 m Jules Oar MD Electronically signed by Jules Oar MD Signature Date/Time: 12/23/2023/10:38:41 AM    Final     Cardiac Studies   Echo  and Zio  Patient Profile     89 y.o. male with a hx of CAD s/p CABG 2000, s/p mechanical aortic valve replacement 2000 on chronic Coumadin  therapy, persistent A-fib/flutter s/p  cardioversion 2019 and ablation 05/2019, hypertension, aortic aneurysm, hypothyroidism, BPH, recurrent GI bleed, CKD stage III AA, cryptogenic cirrhosis, GERD.  Assessment & Plan    Acute on chronic diastolic heart failure with shortness of breath.  If his cr remains stable - plan to start oral Lasix  40 mg daily with potassium supplements.   Bacteremia - limited echo with no signs of vegetation. Hold on initial plan for TEE given hemoglobin drop and recent hx of GI,  a repeat TTE will be reasonable after a week. If clinical picture dictates TEE he may need a GI scope prior/    Abx per ID  Acute blood loss anemia in the setting of recurrent GI bleeding. Hgb 8.3 yesterday. Continue to monitor. Coumadin  restarted. INR pending for today.  Atrial fibrillation-he is in sinus rhythm this morning.  Continue Cardizem  anticoagulation to be restarted as noted above.   History of aortic valve repair stable on recent echo.  CKD stage III.-Creatinine pending for today well above his baseline we will monitor.  Will follow up today   Plan discussed with the patient.  No questions at this time.   For questions or updates, please contact [facility] Please consult [email or web address] for contact info under Cardiology/STEMI.      Signed,  Melroy Bougher, DO  12/24/2023, 9:12 AM

## 2023-12-25 ENCOUNTER — Inpatient Hospital Stay (HOSPITAL_COMMUNITY)

## 2023-12-25 DIAGNOSIS — Q2730 Arteriovenous malformation, site unspecified: Secondary | ICD-10-CM

## 2023-12-25 DIAGNOSIS — K922 Gastrointestinal hemorrhage, unspecified: Secondary | ICD-10-CM | POA: Diagnosis not present

## 2023-12-25 DIAGNOSIS — I480 Paroxysmal atrial fibrillation: Secondary | ICD-10-CM

## 2023-12-25 DIAGNOSIS — Z7901 Long term (current) use of anticoagulants: Secondary | ICD-10-CM

## 2023-12-25 DIAGNOSIS — I5023 Acute on chronic systolic (congestive) heart failure: Secondary | ICD-10-CM

## 2023-12-25 DIAGNOSIS — M79661 Pain in right lower leg: Secondary | ICD-10-CM

## 2023-12-25 DIAGNOSIS — R531 Weakness: Secondary | ICD-10-CM | POA: Diagnosis not present

## 2023-12-25 DIAGNOSIS — D500 Iron deficiency anemia secondary to blood loss (chronic): Secondary | ICD-10-CM | POA: Diagnosis not present

## 2023-12-25 DIAGNOSIS — D62 Acute posthemorrhagic anemia: Secondary | ICD-10-CM | POA: Diagnosis not present

## 2023-12-25 DIAGNOSIS — B954 Other streptococcus as the cause of diseases classified elsewhere: Secondary | ICD-10-CM | POA: Diagnosis not present

## 2023-12-25 LAB — CBC
HCT: 29.6 % — ABNORMAL LOW (ref 39.0–52.0)
Hemoglobin: 9.2 g/dL — ABNORMAL LOW (ref 13.0–17.0)
MCH: 32.6 pg (ref 26.0–34.0)
MCHC: 31.1 g/dL (ref 30.0–36.0)
MCV: 105 fL — ABNORMAL HIGH (ref 80.0–100.0)
Platelets: 219 10*3/uL (ref 150–400)
RBC: 2.82 MIL/uL — ABNORMAL LOW (ref 4.22–5.81)
RDW: 15.1 % (ref 11.5–15.5)
WBC: 9.1 10*3/uL (ref 4.0–10.5)
nRBC: 0 % (ref 0.0–0.2)

## 2023-12-25 LAB — BASIC METABOLIC PANEL WITH GFR
Anion gap: 11 (ref 5–15)
BUN: 27 mg/dL — ABNORMAL HIGH (ref 8–23)
CO2: 21 mmol/L — ABNORMAL LOW (ref 22–32)
Calcium: 8.3 mg/dL — ABNORMAL LOW (ref 8.9–10.3)
Chloride: 106 mmol/L (ref 98–111)
Creatinine, Ser: 1.71 mg/dL — ABNORMAL HIGH (ref 0.61–1.24)
GFR, Estimated: 38 mL/min — ABNORMAL LOW (ref >60)
Glucose, Bld: 122 mg/dL — ABNORMAL HIGH (ref 70–99)
Potassium: 4.9 mmol/L (ref 3.5–5.1)
Sodium: 138 mmol/L (ref 135–145)

## 2023-12-25 LAB — SEDIMENTATION RATE: Sed Rate: 27 mm/h — ABNORMAL HIGH (ref 0–16)

## 2023-12-25 LAB — CULTURE, BLOOD (ROUTINE X 2)
Culture: NO GROWTH
Special Requests: ADEQUATE

## 2023-12-25 LAB — PROTIME-INR
INR: 2.7 — ABNORMAL HIGH (ref 0.8–1.2)
Prothrombin Time: 28.8 s — ABNORMAL HIGH (ref 11.4–15.2)

## 2023-12-25 LAB — C-REACTIVE PROTEIN: CRP: 4.8 mg/dL — ABNORMAL HIGH (ref <1.0)

## 2023-12-25 MED ORDER — WARFARIN SODIUM 5 MG PO TABS
5.0000 mg | ORAL_TABLET | Freq: Once | ORAL | Status: AC
  Administered 2023-12-25: 5 mg via ORAL
  Filled 2023-12-25: qty 1

## 2023-12-25 MED ORDER — SENNOSIDES-DOCUSATE SODIUM 8.6-50 MG PO TABS
1.0000 | ORAL_TABLET | Freq: Two times a day (BID) | ORAL | Status: DC
  Administered 2023-12-25 – 2023-12-28 (×7): 1 via ORAL
  Filled 2023-12-25 (×7): qty 1

## 2023-12-25 MED ORDER — HYDROMORPHONE HCL 1 MG/ML IJ SOLN
0.5000 mg | Freq: Once | INTRAMUSCULAR | Status: AC | PRN
  Administered 2023-12-25: 0.5 mg via INTRAVENOUS
  Filled 2023-12-25: qty 0.5

## 2023-12-25 MED ORDER — HYDROMORPHONE HCL 1 MG/ML IJ SOLN
0.5000 mg | INTRAMUSCULAR | Status: DC | PRN
  Administered 2023-12-25 – 2023-12-27 (×9): 0.5 mg via INTRAVENOUS
  Filled 2023-12-25 (×9): qty 0.5

## 2023-12-25 MED ORDER — AMOXICILLIN 500 MG PO CAPS
1000.0000 mg | ORAL_CAPSULE | Freq: Three times a day (TID) | ORAL | Status: DC
  Administered 2023-12-25 (×2): 1000 mg via ORAL
  Filled 2023-12-25 (×2): qty 2

## 2023-12-25 NOTE — Progress Notes (Signed)
 Pharmacy Consult for Warfarin w/ heparin  gtt bridge Indication: atrial fibrillation and mAVR  Allergies  Allergen Reactions   Carvedilol  Shortness Of Breath    wheezing   Lopid [Gemfibrozil] Other (See Comments)    transaminitis   Monopril [Fosinopril] Other (See Comments)    transaminitis   Vicodin [Hydrocodone-Acetaminophen ]     Severe sensitivity   Phenergan [Promethazine Hcl] Other (See Comments)    Severe somnolence.   Zocor [Simvastatin]     Short memory loss.   Benicar [Olmesartan]     Abdominal cramping and increased stools/ irritable bowel   Codeine Nausea And Vomiting   Prilosec [Omeprazole]     dyspepsia    Patient Measurements: Height: 5\' 10"  (177.8 cm) Weight: 77.4 kg (170 lb 10.2 oz) IBW/kg (Calculated) : 73 HEPARIN  DW (KG): 82.3  Vital Signs: Temp: 98.1 F (36.7 C) (04/23 0700) Temp Source: Oral (04/23 0700) BP: 134/60 (04/23 0700) Pulse Rate: 92 (04/23 0700)  Labs: Recent Labs    12/23/23 0232 12/23/23 0836 12/23/23 1836 12/24/23 0816 12/25/23 0233  HGB 8.3*  --   --  9.3* 9.2*  HCT 27.5*  --   --  30.2* 29.6*  PLT 155  --   --  181 219  LABPROT 21.2*  --   --  27.0* 28.8*  INR 1.8*  --   --  2.5* 2.7*  HEPARINUNFRC  --  0.85* 0.80* >1.10*  --   CREATININE 1.48*  --   --  1.58* 1.71*    Estimated Creatinine Clearance: 30.2 mL/min (A) (by C-G formula based on SCr of 1.71 mg/dL (H)).  Assessment: Jason Santana a 89 y.o. male presented with GIB, now s/p EGD 4/18. In EGD op note, GI states it is ok to resume warfarin on 4/19, confirmed OK to restart with TRH. OF note, patient is currently on azithromycin  for possible pneumonia. Given this DDI, which would likely lead to increased INR, recent GIB with diminished PO intake, and INR on last check 2.5 (4/18), will initiate warfarin at lower than PTA dosing and monitor closely.   Anticoagulation PTA: Warfarin PTA dosing: 5 mg M/F, 7.5 mg all other days  Notable DDI's - azithromycin  500 mg 4/18>19  (increase warfarin concentrations).  INR today within goal range at 2.7.  No overt bleeding or complications noted, CBC stable.  Goal of Therapy:  INR 2-3 Heparin  level 0.3-0.7 units/ml Monitor platelets by anticoagulation protocol: Yes   Plan:  Give warfarin 5 mg PO x1 dose Check INR daily while on warfarin Continue to monitor H&H and platelets  Thank you for allowing pharmacy to be a part of this patient's care.  Joanell Mowers, Davey Erp, BCCP Clinical Pharmacist  12/25/2023 10:06 AM   [facility] pharmacy phone numbers are listed on [email or web address]

## 2023-12-25 NOTE — Progress Notes (Signed)
 Mobility Specialist Progress Note:   12/25/23 1123  Mobility  Activity  (bed mobility)  Level of Assistance Minimal assist, patient does 75% or more  Assistive Device Other (Comment) (HHA)  Activity Response Tolerated fair  Mobility Referral Yes  Mobility visit 1 Mobility  Mobility Specialist Start Time (ACUTE ONLY) 1050  Mobility Specialist Stop Time (ACUTE ONLY) 1103  Mobility Specialist Time Calculation (min) (ACUTE ONLY) 13 min   Attempted to see pt a second time this am but they still c/o strong R hip pain. Pt attempted to shift legs towards EOB but stated the pain was too strong. Despite encouragement pt declined further mobility. Assisted pt in come hip stretches in bed. Call bell and personal belongings in reach. All needs met. RN aware.  Inetta Manes Mobility Specialist  Please contact vis Secure Chat or  Rehab Office [phone]

## 2023-12-25 NOTE — Progress Notes (Signed)
 [facility]  Date of Admission:  12/17/2023      Total days of antibiotics: 4  Ceftriaxone    Azithromycin            ASSESSMENT: Jason Santana is a 89 y.o. male admitted from home with:  Weakness - Anemia and UGIB with AVMs s/p Treatment -  S/p EGD 4/18 which he tolerated well.  Had some supra therapeutic INRs outpatient with chronic AC  -GI/primary team managing.   Right Hip Pain -  Plain films w/o cause. With persistent severe pain will order CT of the right hip w/o contrast given eGFR 30 to rule out effusion.   Strep Mitis/Oralis Bacteremia -  Mechanical AVR -  Unexplained Episodes of Chills -  TTE was negative. Unease about TEE with setting of recent GIB/chronic AC and anemia. May have alternatively been transient bacteremia from acute GIB vs dental extraction.  Will treat with 2 weeks total and switch to high dose amoxicillin  with clinic follow up 7-10d after stopping antibiotics for surveillance blood cultures.   -switch to high dose PO amoxicillin   -blood repeated and prelim negative.   Pneumonia -  Continues on supplemental oxygen - cxr with infiltrate. 5d of IV abx and afebrile since.  -switch to high dose amoxicillin    D/W Wife and patient today    PLAN: High dose amoxicillin  to start today - EOT 5/3 Will get him scheduled Wednesday May 14th for surveillance cultures in ID  FU CT scan of hip    Principal Problem:   Acute on chronic diastolic CHF (congestive heart failure) (HCC) Active Problems:   Paroxysmal atrial flutter (HCC)   Essential hypertension   CAD (coronary artery disease) of artery bypass graft   Hypothyroidism   Acute upper GI bleed   Stage 3a chronic kidney disease (HCC)   Cirrhosis of liver without ascites (HCC)   Left lower lobe pneumonia    dextromethorphan -guaiFENesin   2 tablet Oral BID   diltiazem   120 mg Oral Daily   feeding supplement  237 mL Oral BID BM   furosemide   40 mg Oral Daily    levothyroxine   125 mcg Oral QAC breakfast   lidocaine   1 patch Transdermal Q24H   pantoprazole   40 mg Oral BID   potassium chloride   20 mEq Oral Daily   senna-docusate  1 tablet Oral BID   warfarin  5 mg Oral ONCE-1600   Warfarin - Pharmacist Dosing Inpatient   Does not apply q1600    SUBJECTIVE: Hip is feeling poorly - sudden pains over night Very poor appetite and does not like food consistency.   Review of Systems: Review of Systems  Constitutional:  Positive for malaise/fatigue. Negative for chills and fever.  Respiratory:  Positive for cough.   Musculoskeletal:  Positive for joint pain (right hip).     Allergies  Allergen Reactions   Carvedilol  Shortness Of Breath    wheezing   Lopid [Gemfibrozil] Other (See Comments)    transaminitis   Monopril [Fosinopril] Other (See Comments)    transaminitis   Vicodin [Hydrocodone-Acetaminophen ]     Severe sensitivity   Phenergan [Promethazine Hcl] Other (See Comments)    Severe somnolence.   Zocor [Simvastatin]     Short memory loss.   Benicar [Olmesartan]     Abdominal cramping and increased stools/ irritable bowel   Codeine Nausea And Vomiting   Prilosec [Omeprazole]     dyspepsia    OBJECTIVE: Vitals:  12/24/23 2300 12/25/23 0300 12/25/23 0340 12/25/23 0700  BP: (!) 152/49 (!) 148/60  134/60  Pulse: 81 79  92  Resp: 16 17  14   Temp: 98 F (36.7 C) 98.1 F (36.7 C)  98.1 F (36.7 C)  TempSrc: Oral Oral  Oral  SpO2: 93% 93%  93%  Weight:   77.4 kg   Height:       Body mass index is 24.48 kg/m.  Physical Exam Constitutional:      Appearance: He is well-developed. He is not ill-appearing.  HENT:     Head:     Comments: Hard of hearing Cardiovascular:     Rate and Rhythm: Normal rate and regular rhythm.  Pulmonary:     Effort: Pulmonary effort is normal.     Breath sounds: Normal breath sounds.     Comments: Nasal cannula oxygen  Musculoskeletal:        General: Normal range of motion.  Skin:     General: Skin is warm and dry.     Capillary Refill: Capillary refill takes less than 2 seconds.  Neurological:     Mental Status: He is alert and oriented to person, place, and time.    Lab Results Lab Results  Component Value Date   WBC 9.1 12/25/2023   HGB 9.2 (L) 12/25/2023   HCT 29.6 (L) 12/25/2023   MCV 105.0 (H) 12/25/2023   PLT 219 12/25/2023    Lab Results  Component Value Date   CREATININE 1.71 (H) 12/25/2023   BUN 27 (H) 12/25/2023   NA 138 12/25/2023   K 4.9 12/25/2023   CL 106 12/25/2023   CO2 21 (L) 12/25/2023    Lab Results  Component Value Date   ALT 20 12/18/2023   AST 49 (H) 12/18/2023   ALKPHOS 74 12/18/2023   BILITOT 1.1 12/18/2023     Microbiology: Recent Results (from the past 240 hours)  MRSA Next Gen by PCR, Nasal     Status: None   Collection Time: 12/18/23  4:08 PM   Specimen: Nasal Mucosa; Nasal Swab  Result Value Ref Range Status   MRSA by PCR Next Gen NOT DETECTED NOT DETECTED Final    Comment: (NOTE) The GeneXpert MRSA Assay (FDA approved for NASAL specimens only), is one component of a comprehensive MRSA colonization surveillance program. It is not intended to diagnose MRSA infection nor to guide or monitor treatment for MRSA infections. Test performance is not FDA approved in patients less than 35 years old. Performed at [facility], [address] [place], [place] [address]   Culture, blood (Routine X 2) w Reflex to ID Panel     Status: Abnormal   Collection Time: 12/20/23 11:26 AM   Specimen: BLOOD LEFT ARM  Result Value Ref Range Status   Specimen Description BLOOD LEFT ARM  Final   Special Requests   Final    BOTTLES DRAWN AEROBIC AND ANAEROBIC Blood Culture adequate volume   Culture  Setup Time   Final    GRAM POSITIVE COCCI IN CHAINS ANAEROBIC BOTTLE ONLY CRITICAL RESULT CALLED TO, READ BACK BY AND VERIFIED WITH: PHARMD J LEDFORD 12/21/2023 @ 0412 BY AB Performed at [facility], [address],  [place], [place] [address]    Culture STREPTOCOCCUS MITIS/ORALIS (A)  Final   Report Status 12/23/2023 FINAL  Final   Organism ID, Bacteria STREPTOCOCCUS MITIS/ORALIS  Final      Susceptibility   Streptococcus mitis/oralis - MIC*  PENICILLIN <=0.06 SENSITIVE Sensitive     CEFTRIAXONE  <=0.12 SENSITIVE Sensitive     LEVOFLOXACIN  0.5 SENSITIVE Sensitive     VANCOMYCIN 0.5 SENSITIVE Sensitive     * STREPTOCOCCUS MITIS/ORALIS  Blood Culture ID Panel (Reflexed)     Status: Abnormal   Collection Time: 12/20/23 11:26 AM  Result Value Ref Range Status   Enterococcus faecalis NOT DETECTED NOT DETECTED Final   Enterococcus Faecium NOT DETECTED NOT DETECTED Final   Listeria monocytogenes NOT DETECTED NOT DETECTED Final   Staphylococcus species NOT DETECTED NOT DETECTED Final   Staphylococcus aureus (BCID) NOT DETECTED NOT DETECTED Final   Staphylococcus epidermidis NOT DETECTED NOT DETECTED Final   Staphylococcus lugdunensis NOT DETECTED NOT DETECTED Final   Streptococcus species DETECTED (A) NOT DETECTED Final    Comment: Not Enterococcus species, Streptococcus agalactiae, Streptococcus pyogenes, or Streptococcus pneumoniae. CRITICAL RESULT CALLED TO, READ BACK BY AND VERIFIED WITH: PHARMD J LEDFORD 12/21/2023 @ 0412 BY AB    Streptococcus agalactiae NOT DETECTED NOT DETECTED Final   Streptococcus pneumoniae NOT DETECTED NOT DETECTED Final   Streptococcus pyogenes NOT DETECTED NOT DETECTED Final   A.calcoaceticus-baumannii NOT DETECTED NOT DETECTED Final   Bacteroides fragilis NOT DETECTED NOT DETECTED Final   Enterobacterales NOT DETECTED NOT DETECTED Final   Enterobacter cloacae complex NOT DETECTED NOT DETECTED Final   Escherichia coli NOT DETECTED NOT DETECTED Final   Klebsiella aerogenes NOT DETECTED NOT DETECTED Final   Klebsiella oxytoca NOT DETECTED NOT DETECTED Final   Klebsiella pneumoniae NOT DETECTED NOT DETECTED Final   Proteus species NOT DETECTED NOT DETECTED Final   Salmonella  species NOT DETECTED NOT DETECTED Final   Serratia marcescens NOT DETECTED NOT DETECTED Final   Haemophilus influenzae NOT DETECTED NOT DETECTED Final   Neisseria meningitidis NOT DETECTED NOT DETECTED Final   Pseudomonas aeruginosa NOT DETECTED NOT DETECTED Final   Stenotrophomonas maltophilia NOT DETECTED NOT DETECTED Final   Candida albicans NOT DETECTED NOT DETECTED Final   Candida auris NOT DETECTED NOT DETECTED Final   Candida glabrata NOT DETECTED NOT DETECTED Final   Candida krusei NOT DETECTED NOT DETECTED Final   Candida parapsilosis NOT DETECTED NOT DETECTED Final   Candida tropicalis NOT DETECTED NOT DETECTED Final   Cryptococcus neoformans/gattii NOT DETECTED NOT DETECTED Final    Comment: Performed at [facility], [address] [place], [place] [address]  Culture, blood (Routine X 2) w Reflex to ID Panel     Status: None   Collection Time: 12/20/23 11:27 AM   Specimen: BLOOD RIGHT ARM  Result Value Ref Range Status   Specimen Description BLOOD RIGHT ARM  Final   Special Requests   Final    BOTTLES DRAWN AEROBIC AND ANAEROBIC Blood Culture adequate volume   Culture   Final    NO GROWTH 5 DAYS Performed at [facility], [address] [place], [place] [address]    Report Status 12/25/2023 FINAL  Final  Culture, blood (Routine X 2) w Reflex to ID Panel     Status: None (Preliminary result)   Collection Time: 12/22/23  2:32 AM   Specimen: BLOOD RIGHT HAND  Result Value Ref Range Status   Specimen Description BLOOD RIGHT HAND  Final   Special Requests   Final    BOTTLES DRAWN AEROBIC AND ANAEROBIC Blood Culture adequate volume   Culture   Final    NO GROWTH 3 DAYS Performed at [facility], [address], [place], [place] [address]  Report Status PENDING  Incomplete  Culture, blood (Routine X 2) w Reflex to ID Panel     Status: None (Preliminary result)   Collection Time: 12/22/23  2:44 AM   Specimen: BLOOD LEFT ARM  Result Value Ref Range  Status   Specimen Description BLOOD LEFT ARM  Final   Special Requests   Final    BOTTLES DRAWN AEROBIC AND ANAEROBIC Blood Culture adequate volume   Culture   Final    NO GROWTH 3 DAYS Performed at [facility], [address], [place], [place] [address]    Report Status PENDING  Incomplete    Gibson Kurtz, MSN, NP-C [facility]  [email or web address] Pager: [phone] Office: [phone] [facility] Main Line: [phone] *Secure Chat Communication Welcome

## 2023-12-25 NOTE — Progress Notes (Signed)
 Spoke with Dr. Drexel Gentles.  ID will continue 3 weeks of antibiotics. Therefore there is no need for any further testing.  No plans for TEE. Will sign off at this time.

## 2023-12-25 NOTE — Progress Notes (Signed)
 Lower ext venous duplex  has been completed. Refer to [facility] under chart review to view preliminary results.   12/25/2023  4:10 PM Margarita Croke D

## 2023-12-25 NOTE — Plan of Care (Signed)

## 2023-12-25 NOTE — Progress Notes (Addendum)
 [facility] night cross cover note:   I was notified by this pt's RN that this patient is complaining of new right hip discomfort starting earlier today, with potential radiation to the right groin.  This discomfort has been refractory to prn acetaminophen  as well as Robaxin .  I subsequently ordered lidocaine  patch as well as fentanyl  12.5 mg IV prn, but discontinued this medication after 1 dose, as patient reported no significant improvement in his right hip discomfort with the fentanyl .  I then ordered morphine  3 mg IV x 1 dose prn, and also ordered plain films of the right hip.   Update: I've placed order for Dilaudid  0.5 mg IV x 1 dose prn for additional pain control relating to the right hip discomfort.   Camelia Cavalier, DO Hospitalist

## 2023-12-25 NOTE — Progress Notes (Signed)
 Physical Therapy Treatment Patient Details Name: Jason Santana MRN: [id number] DOB: [birth year] Today's Date: 12/25/2023   History of Present Illness Pt is an 89 y/o M presenting to ED on 4/15 wtih BLE edema x1 week and worsening dyspnea, admitted for acute GI bleed. CXR with LLL PNA/bacteremia.  4/18 EGD and upper GI endoscopy. 4/23 R hip pain, plain films negative. PMH includes A fib, A flutter, AVR, PVCs, CKD IIIA, HTN, cryptogenic cirrhosis, GERD, hypothryoidism    PT Comments  Pt in bed with wife present upon arrival and agreeable to PT session. Pt reported having sudden R hip/groin pain that began yesterday with no MOI. Pt required MaxAx2 for bed mobility using the helicopter method and bed pad. Upon sitting upright, pt immediately requested to return to supine due to a sudden increase in pain. Pt was able to tolerate isometric activation of R quad and glute musculature with minimal increase in pain. Discussed that if pt continues to need heavy physical assist, he will need to consider getting further rehab prior to d/c home. Acute PT to follow.   If plan is discharge home, recommend the following: A lot of help with walking and/or transfers;A lot of help with bathing/dressing/bathroom;Assistance with cooking/housework;Assist for transportation;Help with stairs or ramp for entrance   Can travel by private vehicle      No  Equipment Recommendations  Rolling walker (2 wheels)       Precautions / Restrictions Precautions Precautions: Fall Recall of Precautions/Restrictions: Intact Restrictions Weight Bearing Restrictions Per Provider Order: No     Mobility  Bed Mobility Overal bed mobility: Needs Assistance Bed Mobility: Supine to Sit, Sit to Supine    Supine to sit: Max assist, +2 for physical assistance, +2 for safety/equipment Sit to supine: +2 for physical assistance, +2 for safety/equipment, Max assist   General bed mobility comments: pt unable to move R LE towards EOB.  MaxAx2 using helicopter method and bed pad. Upon sitting upright, pt reported extreme pain and started to lay back. MaxAx2 to return to supine.    Transfers  General transfer comment: unable due to R hip pain        Balance Overall balance assessment: Needs assistance, Mild deficits observed, not formally tested Sitting-balance support: Bilateral upper extremity supported, Feet supported Sitting balance-Leahy Scale: Poor Sitting balance - Comments: unable to tolerate sitting EOB due to increase in R hip pain      Communication Communication Communication: No apparent difficulties  Cognition Arousal: Alert Behavior During Therapy: WFL for tasks assessed/performed   PT - Cognitive impairments: No apparent impairments  Following commands: Intact      Cueing Cueing Techniques: Verbal cues  Exercises Other Exercises Other Exercises: x3 reps of glute squeeze, isometric quad set, and ankle pumps    General Comments General comments (skin integrity, edema, etc.): Cleared by RN for PT session      Pertinent Vitals/Pain Pain Assessment Pain Assessment: Faces Faces Pain Scale: Hurts whole lot Pain Location: R hip and LE with movement Pain Descriptors / Indicators: Burning, Discomfort Pain Intervention(s): Limited activity within patient's tolerance, Monitored during session, Repositioned     PT Goals (current goals can now be found in the care plan section) Acute Rehab PT Goals PT Goal Formulation: With patient/family Time For Goal Achievement: 01/04/24 Potential to Achieve Goals: Good Progress towards PT goals: Not progressing toward goals - comment    Frequency    Min 2X/week       AM-PAC PT "6 Clicks" Mobility  Outcome Measure  Help needed turning from your back to your side while in a flat bed without using bedrails?: A Lot Help needed moving from lying on your back to sitting on the side of a flat bed without using bedrails?: Total Help needed moving to and  from a bed to a chair (including a wheelchair)?: Total Help needed standing up from a chair using your arms (e.g., wheelchair or bedside chair)?: Total Help needed to walk in hospital room?: Total Help needed climbing 3-5 steps with a railing? : Total 6 Click Score: 7    End of Session Equipment Utilized During Treatment: Oxygen Activity Tolerance: Patient limited by pain Patient left: in bed;with call bell/phone within reach;with family/visitor present;Other (comment) (vascular in room) Nurse Communication: Mobility status PT Visit Diagnosis: Other abnormalities of gait and mobility (R26.89);Muscle weakness (generalized) (M62.81)     Time: 4782-9562 PT Time Calculation (min) (ACUTE ONLY): 12 min  Charges:    $Therapeutic Exercise: 8-22 mins PT General Charges $$ ACUTE PT VISIT: 1 Visit                     Orysia Blas, PT, DPT Secure Chat Preferred  Rehab Office [phone]   Alissa April Adela Ades 12/25/2023, 3:30 PM

## 2023-12-25 NOTE — Progress Notes (Addendum)
 Progress Note  Patient Name: Jason Santana Date of Encounter: 12/25/2023  Primary Cardiologist: Dorothye Gathers, MD   Subjective   Patient seen and examined by his bedside.    Inpatient Medications    Scheduled Meds:  dextromethorphan -guaiFENesin   2 tablet Oral BID   diltiazem   120 mg Oral Daily   feeding supplement  237 mL Oral BID BM   furosemide   40 mg Oral Daily   levothyroxine   125 mcg Oral QAC breakfast   lidocaine   1 patch Transdermal Q24H   pantoprazole   40 mg Oral BID   potassium chloride   20 mEq Oral Daily   senna-docusate  1 tablet Oral BID   Warfarin - Pharmacist Dosing Inpatient   Does not apply q1600   Continuous Infusions:  cefTRIAXone  (ROCEPHIN )  IV 2 g (12/24/23 1551)   PRN Meds: acetaminophen , HYDROmorphone  (DILAUDID ) injection, ipratropium-albuterol , loratadine , melatonin, methocarbamol , naLOXone  (NARCAN )  injection, ondansetron  (ZOFRAN ) IV   Vital Signs    Vitals:   12/24/23 2300 12/25/23 0300 12/25/23 0340 12/25/23 0700  BP: (!) 152/49 (!) 148/60  134/60  Pulse: 81 79  92  Resp: 16 17  14   Temp: 98 F (36.7 C) 98.1 F (36.7 C)  98.1 F (36.7 C)  TempSrc: Oral Oral  Oral  SpO2: 93% 93%  93%  Weight:   77.4 kg   Height:        Intake/Output Summary (Last 24 hours) at 12/25/2023 0815 Last data filed at 12/25/2023 0500 Gross per 24 hour  Intake 340 ml  Output 1400 ml  Net -1060 ml   Filed Weights   12/23/23 0421 12/24/23 0301 12/25/23 0340  Weight: 78.6 kg 78 kg 77.4 kg    Telemetry    Sinu rhythm - Personally Reviewed  ECG     None today- Personally Reviewed  Physical Exam     General: Comfortable Head: Atraumatic, normal size  Eyes: PEERLA, EOMI  Neck: Supple, normal JVD Cardiac: Normal S1, S2; RRR; no murmurs, rubs, or gallops Lungs: Clear to auscultation bilaterally Abd: Soft, nontender, no hepatomegaly  Ext: warm, no edema Musculoskeletal: No deformities, BUE and BLE strength normal and equal Skin: Warm and dry, no  rashes   Neuro: Alert and oriented to person, place, time, and situation, CNII-XII grossly intact, no focal deficits  Psych: Normal mood and affect   Labs    Chemistry Recent Labs  Lab 12/23/23 0232 12/24/23 0816 12/25/23 0233  NA 137 137 138  K 4.2 4.2 4.9  CL 110 106 106  CO2 23 23 21*  GLUCOSE 90 95 122*  BUN 21 20 27*  CREATININE 1.48* 1.58* 1.71*  CALCIUM  8.0* 8.1* 8.3*  ALBUMIN 2.0*  --   --   GFRNONAA 45* 42* 38*  ANIONGAP 4* 8 11     Hematology Recent Labs  Lab 12/23/23 0232 12/24/23 0816 12/25/23 0233  WBC 6.0 6.7 9.1  RBC 2.61* 2.89* 2.82*  HGB 8.3* 9.3* 9.2*  HCT 27.5* 30.2* 29.6*  MCV 105.4* 104.5* 105.0*  MCH 31.8 32.2 32.6  MCHC 30.2 30.8 31.1  RDW 15.1 15.1 15.1  PLT 155 181 219    Cardiac EnzymesNo results for input(s): "TROPONINI" in the last 168 hours. No results for input(s): "TROPIPOC" in the last 168 hours.   BNP No results for input(s): "BNP", "PROBNP" in the last 168 hours.    DDimer No results for input(s): "DDIMER" in the last 168 hours.   Radiology    DG HIP UNILAT WITH  PELVIS 1V RIGHT Result Date: 12/25/2023 CLINICAL DATA:  Right hip pain EXAM: DG HIP (WITH OR WITHOUT PELVIS) 1V RIGHT COMPARISON:  None Available. FINDINGS: No acute fracture or dislocation. Degenerative changes pubic symphysis, both hips, SI joints and lower lumbar spine. IMPRESSION: No acute fracture or dislocation. Electronically Signed   By: Rozell Cornet M.D.   On: 12/25/2023 00:10   ECHOCARDIOGRAM LIMITED Result Date: 12/23/2023    ECHOCARDIOGRAM LIMITED REPORT   Patient Name:   Jason Santana Date of Exam: 12/23/2023 Medical Rec #:  [id number]    Height:       70.0 in Accession #:    [id number]   Weight:       173.3 lb Date of Birth:  August 07, 1932   BSA:          1.964 m Patient Age:    89 years     BP:           125/54 mmHg Patient Gender: M            HR:           73 bpm. Exam Location:  Inpatient Procedure: Limited Echo, Color Doppler and Cardiac Doppler (Both  Spectral and            Color Flow Doppler were utilized during procedure). Indications:    Fever R50.9  History:        Patient has prior history of Echocardiogram examinations, most                 recent 10/15/2023. CHF, CAD, Arrythmias:Atrial Fibrillation; Risk                 Factors:Hypertension.  Sonographer:    Sherline Distel Senior RDCS Referring Phys: [id number] MAURICIO DANIEL ARRIEN IMPRESSIONS  1. Left ventricular ejection fraction, by estimation, is 60 to 65%. The left ventricle has normal function. Left ventricular diastolic function could not be evaluated.  2. Right ventricular systolic function is low normal. The right ventricular size is mildly enlarged. There is mildly elevated pulmonary artery systolic pressure. The estimated right ventricular systolic pressure is 40.3 mmHg.  3. Left atrial size was moderately dilated.  4. Right atrial size was moderately dilated.  5. The mitral valve is abnormal. Trivial mitral valve regurgitation. Moderate mitral annular calcification.  6. The tricuspid valve is abnormal. Tricuspid valve regurgitation is moderate.  7. The aortic valve has been repaired/replaced. Aortic valve regurgitation is trivial. No aortic stenosis is present. Procedure Date: 2000. Echo findings are consistent with normal structure and function of the aortic valve prosthesis. Aortic valve mean  gradient measures 7.0 mmHg. Aortic valve Vmax measures 1.74 m/s.  8. The inferior vena cava is normal in size with <50% respiratory variability, suggesting right atrial pressure of 8 mmHg. Conclusion(s)/Recommendation(s): No evidence of valvular vegetations on this transthoracic echocardiogram. Consider a transesophageal echocardiogram to exclude infective endocarditis if clinically indicated. Limited echo to assess for endocarditis. FINDINGS  Left Ventricle: Left ventricular ejection fraction, by estimation, is 60 to 65%. The left ventricle has normal function. Left ventricular diastolic function could not be  evaluated. Right Ventricle: The right ventricular size is mildly enlarged. Right ventricular systolic function is low normal. There is mildly elevated pulmonary artery systolic pressure. The tricuspid regurgitant velocity is 2.84 m/s, and with an assumed right atrial pressure of 8 mmHg, the estimated right ventricular systolic pressure is 40.3 mmHg. Left Atrium: Left atrial size was moderately dilated. Right Atrium: Right atrial size was moderately dilated. Pericardium:  There is no evidence of pericardial effusion. Mitral Valve: The mitral valve is abnormal. Moderate mitral annular calcification. Trivial mitral valve regurgitation. Tricuspid Valve: The tricuspid valve is abnormal. Tricuspid valve regurgitation is moderate. Aortic Valve: The aortic valve has been repaired/replaced. Aortic valve regurgitation is trivial. No aortic stenosis is present. Aortic valve mean gradient measures 7.0 mmHg. Aortic valve peak gradient measures 12.1 mmHg. There is a [employer] bioprosthetic mechanical valve present in the aortic position. Echo findings are consistent with normal structure and function of the aortic valve prosthesis. Pulmonic Valve: The pulmonic valve was normal in structure. Venous: The inferior vena cava is normal in size with less than 50% respiratory variability, suggesting right atrial pressure of 8 mmHg. Additional Comments: Spectral Doppler performed. Color Doppler performed.  AORTIC VALVE AV Vmax:           174.00 cm/s AV Vmean:          128.000 cm/s AV VTI:            0.399 m AV Peak Grad:      12.1 mmHg AV Mean Grad:      7.0 mmHg LVOT Vmax:         65.70 cm/s LVOT Vmean:        47.900 cm/s LVOT VTI:          0.158 m LVOT/AV VTI ratio: 0.40 TRICUSPID VALVE TR Peak grad:   32.3 mmHg TR Vmax:        284.00 cm/s  SHUNTS Systemic VTI: 0.16 m Jules Oar MD Electronically signed by Jules Oar MD Signature Date/Time: 12/23/2023/10:38:41 AM    Final     Cardiac Studies   Echo  and Zio  Patient  Profile     89 y.o. male with a hx of CAD s/p CABG 2000, s/p mechanical aortic valve replacement 2000 on chronic Coumadin  therapy, persistent A-fib/flutter s/p cardioversion 2019 and ablation 05/2019, hypertension, aortic aneurysm, hypothyroidism, BPH, recurrent GI bleed, CKD stage III AA, cryptogenic cirrhosis, GERD.  Assessment & Plan    Acute on chronic diastolic heart failure-  Restart Lasix  40 mg daily - cr trending up will so will stop the diuretics for now. Clinically does not appear to be volume overloaded.   Bacteremia - limited echo with no signs of vegetation. Hold on initial plan for TEE  would prefer to have GI see the patient and make comment if we can proceed with TEE or not given recently Endoscopy and findings. For now please repeat the TTE tomorrow.   Abx per ID  Acute blood loss anemia in the setting of recurrent GI bleeding. Hgb 9.2 today. Continue to monitor. Coumadin  restarted. INR at target off heparin  gtt now.  Atrial fibrillation-he is in sinus rhythm this morning.  Continue Cardizem  anticoagulation to be restarted as noted above.   History of aortic valve repair stable on recent echo.  CKD stage III.-Creatinine trending up  Plan discussed with the patient.  No questions at this time.   For questions or updates, please contact [facility] Please consult [email or web address] for contact info under Cardiology/STEMI.      Signed, [place] Latulippe, DO  12/25/2023, 8:15 AM

## 2023-12-26 DIAGNOSIS — I5033 Acute on chronic diastolic (congestive) heart failure: Secondary | ICD-10-CM | POA: Diagnosis not present

## 2023-12-26 LAB — CBC
HCT: 28.6 % — ABNORMAL LOW (ref 39.0–52.0)
Hemoglobin: 8.7 g/dL — ABNORMAL LOW (ref 13.0–17.0)
MCH: 32.3 pg (ref 26.0–34.0)
MCHC: 30.4 g/dL (ref 30.0–36.0)
MCV: 106.3 fL — ABNORMAL HIGH (ref 80.0–100.0)
Platelets: 236 10*3/uL (ref 150–400)
RBC: 2.69 MIL/uL — ABNORMAL LOW (ref 4.22–5.81)
RDW: 15.7 % — ABNORMAL HIGH (ref 11.5–15.5)
WBC: 12.7 10*3/uL — ABNORMAL HIGH (ref 4.0–10.5)
nRBC: 0.2 % (ref 0.0–0.2)

## 2023-12-26 LAB — COMPREHENSIVE METABOLIC PANEL WITH GFR
ALT: 24 U/L (ref 0–44)
AST: 90 U/L — ABNORMAL HIGH (ref 15–41)
Albumin: 2.5 g/dL — ABNORMAL LOW (ref 3.5–5.0)
Alkaline Phosphatase: 86 U/L (ref 38–126)
Anion gap: 8 (ref 5–15)
BUN: 34 mg/dL — ABNORMAL HIGH (ref 8–23)
CO2: 23 mmol/L (ref 22–32)
Calcium: 8.7 mg/dL — ABNORMAL LOW (ref 8.9–10.3)
Chloride: 107 mmol/L (ref 98–111)
Creatinine, Ser: 1.69 mg/dL — ABNORMAL HIGH (ref 0.61–1.24)
GFR, Estimated: 38 mL/min — ABNORMAL LOW (ref >60)
Glucose, Bld: 131 mg/dL — ABNORMAL HIGH (ref 70–99)
Potassium: 5.1 mmol/L (ref 3.5–5.1)
Sodium: 138 mmol/L (ref 135–145)
Total Bilirubin: 0.8 mg/dL (ref 0.0–1.2)
Total Protein: 6.2 g/dL — ABNORMAL LOW (ref 6.5–8.1)

## 2023-12-26 LAB — PROTIME-INR
INR: 3.9 — ABNORMAL HIGH (ref 0.8–1.2)
Prothrombin Time: 38.5 s — ABNORMAL HIGH (ref 11.4–15.2)

## 2023-12-26 MED ORDER — CEFAZOLIN SODIUM-DEXTROSE 2-4 GM/100ML-% IV SOLN
2.0000 g | Freq: Three times a day (TID) | INTRAVENOUS | Status: DC
  Administered 2023-12-26 – 2023-12-28 (×6): 2 g via INTRAVENOUS
  Filled 2023-12-26 (×6): qty 100

## 2023-12-26 NOTE — Progress Notes (Signed)
 ADDENDUM: -CT HIP: Shows large right psoas fluid collection, hematoma likely abscess also possible -d/w ID, consult IR for CT-guided aspiration, stop warfarin, changed to IV heparin  -Called and updated wife, she is undecided considering his overall frailty, numerous comorbidities and failure to thrive, and will discuss more with her daughters -allow time for outcomes, will involve palliative care if he continues to decline  Jason Fast, MD

## 2023-12-26 NOTE — Progress Notes (Signed)
 IR consulted for right psoas fluid collection.  After review by our attending for 12/27/23, Dr. Lovell Rubenstein, IR is unable to proceed with aspiration/drainage.   Imaging is most consistent with a intramuscular hematoma. INR is 3.9 and Hgb is trending down. Aspiration is not recommended at this time due to these findings. If patient's care team would like to consider aspiration/drainage, please continue to monitor hemoglobin. If Hemoglobin continues to drop, IR recommends obtaining CT abdomen/pelvis.   Please contact IR with further questions/concerns.

## 2023-12-26 NOTE — Plan of Care (Signed)

## 2023-12-26 NOTE — Progress Notes (Signed)
 Pharmacy Antibiotic Note  Jason Santana is a 89 y.o. male admitted on 12/17/2023 with strep mitis/oralis in 1 of 4 blood cultures and now  with concern for psoas muscle abscess. Hx mech AVR but unable to do TEE thus far. Pharmacy has been consulted for Cefazolin  dosing.   SCr 1.69, CrCl~30 ml/min.   Plan: - Start Cefazolin  2g IV every 8 hours - Will monitor renal function closely for necessary dose adjustments  Height: 5\' 10"  (177.8 cm) Weight: 77 kg (169 lb 12.1 oz) IBW/kg (Calculated) : 73  Temp (24hrs), Avg:98.5 F (36.9 C), Min:97.7 F (36.5 C), Max:98.9 F (37.2 C)  Recent Labs  Lab 12/22/23 0232 12/23/23 0232 12/24/23 0816 12/25/23 0233 12/26/23 0311  WBC 4.8 6.0 6.7 9.1 12.7*  CREATININE 1.70* 1.48* 1.58* 1.71* 1.69*    Estimated Creatinine Clearance: 30.6 mL/min (A) (by C-G formula based on SCr of 1.69 mg/dL (H)).    Allergies  Allergen Reactions   Carvedilol  Shortness Of Breath    wheezing   Lopid [Gemfibrozil] Other (See Comments)    transaminitis   Monopril [Fosinopril] Other (See Comments)    transaminitis   Vicodin [Hydrocodone-Acetaminophen ]     Severe sensitivity   Phenergan [Promethazine Hcl] Other (See Comments)    Severe somnolence.   Zocor [Simvastatin]     Short memory loss.   Benicar [Olmesartan]     Abdominal cramping and increased stools/ irritable bowel   Codeine Nausea And Vomiting   Prilosec [Omeprazole]     dyspepsia    Antimicrobials this admission: Ceftriaxone  4/18 >> 4/22 Azithro 4/18 > 4/19 Amoxicillin  4/23 >> 4/24 Cefazolin  4/24 >>  Microbiology results: 4/18 BCx >> 1/4 strep mitis 4/20 BCx >> [id number]  Thank you for allowing pharmacy to be a part of this patient's care.  Garland Junk, PharmD, BCPS, BCIDP Infectious Diseases Clinical Pharmacist 12/26/2023 2:38 PM   **Pharmacist phone directory can now be found on [email or web address] ([email or web address]).  Listed under [facility].

## 2023-12-26 NOTE — Progress Notes (Signed)
 PROGRESS NOTE    Jason Santana  ZHY:[id number] DOB: March 14, 1932 DOA: 12/17/2023 PCP: Laneta Pintos, MD  89/M w CAD, A-fib/flutter and mechanical aortic valve replacement on Coumadin , PVCs, CKD stage IIIa, hypertension, cryptogenic cirrhosis, GERD, hypothyroidism presenting with complaints of shortness of breath and bilateral lower extremity edema.  Reports chronically dark stools. In ED, VSS, labs w/ Hb 6.8, creatinine 1.4 , albumin 2.5,  troponin 255> 251, BNP 252, INR 3.7.  FOBT positive , CXR wcardiomegaly and small left pleural effusion.   Gastroenterology also consulted -Dr. Elvin Hammer with [facility]. Patient was given IV Lasix  40 mg and IV Protonix  80 mg in the ED. 1 unit PRBCs ordered.  Subjective: Continues to report right groin/hip pain  Assessment and Plan:  Acute on chronic diastolic CHF  -Echo with EF 60 to 65%, RV  low normal, RVSP 53,7 mmHg. LA with moderate dilation, moderate tricuspid regurgitation, St Jude mechanical valve at the aortic position, -Cards following, diuresed with IV Lasix , down 10 LB, now switched to oral Lasix  - Continue Cardizem   Mechanical aortic valve -Continue warfarin, INR therapeutic   Strep mitis bacteremia -Presumed to be related to recent dental extraction 4 weeks ago -TTE negative -Concern regarding TEE in the setting of recent GI bleed, anticoagulation and anemia, infectious disease following treated with IV ceftriaxone  initially -Repeat blood cultures are negative -Transitioned to oral amoxicillin  to complete 2-week course, last date 5/3, follow-up scheduled for 5/14 for surveillance cultures - Follow-up CT hip  Right groin pain -Etiology remains unclear, follow-up CT especially in the setting of recent bacteremia, Dopplers negative for right leg DVT -Supportive care, pain control   Acute upper GI bleed/acute blood loss anemia -04/18 EGD, with 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   -Repeat hemoglobin 9.3 - Status post IV iron  and and no signs of overt bleeding - Continue to follow hemoglobin trend.   Paroxysmal atrial flutter (HCC) - Continue Cardizem  and warfarin   CAD (coronary artery disease) of artery bypass graft -No chest pain, no acute coronary syndrome.  - 2D echo demonstrated no wall motion abnormalities.   Cirrhosis of liver without ascites (HCC) - Diuretics as above   Acute kidney injury on stage 3a chronic kidney disease (HCC) -Stable and at baseline   Hypothyroidism -Continue Synthroid .  DVT prophylaxis: warfarin Code Status: DNR Family Communication: None present, will update spouse Disposition Plan: May need rehab  Consultants: Cards, infectious disease    Objective: Vitals:   12/26/23 0000 12/26/23 0400 12/26/23 0421 12/26/23 0832  BP: 135/61 (!) 142/53  (!) 139/46  Pulse: 94 94  93  Resp: 15 16  18   Temp: 98.8 F (37.1 C) 98.9 F (37.2 C)  97.7 F (36.5 C)  TempSrc: Oral Oral  Oral  SpO2: 94% 91%  90%  Weight:   77 kg   Height:        Intake/Output Summary (Last 24 hours) at 12/26/2023 0944 Last data filed at 12/26/2023 0400 Gross per 24 hour  Intake 120 ml  Output 1250 ml  Net -1130 ml   Filed Weights   12/24/23 0301 12/25/23 0340 12/26/23 0421  Weight: 78 kg 77.4 kg 77 kg    Examination: Gen: Awake, Alert, Oriented X 3, chronically ill-appearing HEENT: No JVD Lungs: Decreased breath sound at the bases CVS: S1S2/irregular rhythm, metallic click Abd: soft, Non tender, non distended, BS present Extremities: No edema, right groin with mildly painful range of motion Skin: no new rashes  on exposed skin     Data Reviewed:   CBC: Recent Labs  Lab 12/22/23 0232 12/23/23 0232 12/24/23 0816 12/25/23 0233 12/26/23 0311  WBC 4.8 6.0 6.7 9.1 12.7*  NEUTROABS 3.1  --   --   --   --   HGB 8.9* 8.3* 9.3* 9.2* 8.7*  HCT 28.6* 27.5* 30.2* 29.6* 28.6*  MCV 105.1* 105.4* 104.5* 105.0* 106.3*  PLT 162 155 181 219  236   Basic Metabolic Panel: Recent Labs  Lab 12/22/23 0232 12/23/23 0232 12/24/23 0816 12/25/23 0233 12/26/23 0311  NA 137 137 137 138 138  K 4.1 4.2 4.2 4.9 5.1  CL 108 110 106 106 107  CO2 23 23 23  21* 23  GLUCOSE 125* 90 95 122* 131*  BUN 26* 21 20 27* 34*  CREATININE 1.70* 1.48* 1.58* 1.71* 1.69*  CALCIUM  8.0* 8.0* 8.1* 8.3* 8.7*  PHOS  --  2.7  --   --   --    GFR: Estimated Creatinine Clearance: 30.6 mL/min (A) (by C-G formula based on SCr of 1.69 mg/dL (H)). Liver Function Tests: Recent Labs  Lab 12/23/23 0232 12/26/23 0311  AST  --  90*  ALT  --  24  ALKPHOS  --  86  BILITOT  --  0.8  PROT  --  6.2*  ALBUMIN 2.0* 2.5*   No results for input(s): "LIPASE", "AMYLASE" in the last 168 hours. No results for input(s): "AMMONIA" in the last 168 hours. Coagulation Profile: Recent Labs  Lab 12/22/23 0232 12/23/23 0232 12/24/23 0816 12/25/23 0233 12/26/23 0311  INR 1.7* 1.8* 2.5* 2.7* 3.9*   Cardiac Enzymes: No results for input(s): "CKTOTAL", "CKMB", "CKMBINDEX", "TROPONINI" in the last 168 hours. BNP (last 3 results) No results for input(s): "PROBNP" in the last 8760 hours. HbA1C: No results for input(s): "HGBA1C" in the last 72 hours. CBG: No results for input(s): "GLUCAP" in the last 168 hours. Lipid Profile: No results for input(s): "CHOL", "HDL", "LDLCALC", "TRIG", "CHOLHDL", "LDLDIRECT" in the last 72 hours. Thyroid  Function Tests: No results for input(s): "TSH", "T4TOTAL", "FREET4", "T3FREE", "THYROIDAB" in the last 72 hours. Anemia Panel: No results for input(s): "VITAMINB12", "FOLATE", "FERRITIN", "TIBC", "IRON ", "RETICCTPCT" in the last 72 hours. Urine analysis:    Component Value Date/Time   COLORURINE STRAW (A) 12/20/2023 1700   APPEARANCEUR CLEAR 12/20/2023 1700   LABSPEC 1.005 12/20/2023 1700   PHURINE 7.0 12/20/2023 1700   GLUCOSEU NEGATIVE 12/20/2023 1700   HGBUR NEGATIVE 12/20/2023 1700   BILIRUBINUR NEGATIVE 12/20/2023 1700    BILIRUBINUR negative 12/15/2021 1002   KETONESUR NEGATIVE 12/20/2023 1700   PROTEINUR NEGATIVE 12/20/2023 1700   UROBILINOGEN 1.0 12/15/2021 1002   NITRITE NEGATIVE 12/20/2023 1700   LEUKOCYTESUR TRACE (A) 12/20/2023 1700   Sepsis Labs: @LABRCNTIP (procalcitonin:4,lacticidven:4)  ) Recent Results (from the past 240 hours)  MRSA Next Gen by PCR, Nasal     Status: None   Collection Time: 12/18/23  4:08 PM   Specimen: Nasal Mucosa; Nasal Swab  Result Value Ref Range Status   MRSA by PCR Next Gen NOT DETECTED NOT DETECTED Final    Comment: (NOTE) The GeneXpert MRSA Assay (FDA approved for NASAL specimens only), is one component of a comprehensive MRSA colonization surveillance program. It is not intended to diagnose MRSA infection nor to guide or monitor treatment for MRSA infections. Test performance is not FDA approved in patients less than 37 years old. Performed at [facility], [address], [place], [place] [address]  Culture, blood (Routine X 2) w Reflex to ID Panel     Status: Abnormal   Collection Time: 12/20/23 11:26 AM   Specimen: BLOOD LEFT ARM  Result Value Ref Range Status   Specimen Description BLOOD LEFT ARM  Final   Special Requests   Final    BOTTLES DRAWN AEROBIC AND ANAEROBIC Blood Culture adequate volume   Culture  Setup Time   Final    GRAM POSITIVE COCCI IN CHAINS ANAEROBIC BOTTLE ONLY CRITICAL RESULT CALLED TO, READ BACK BY AND VERIFIED WITH: PHARMD J LEDFORD 12/21/2023 @ 0412 BY AB Performed at [facility], [address], [place], [place] [address]    Culture STREPTOCOCCUS MITIS/ORALIS (A)  Final   Report Status 12/23/2023 FINAL  Final   Organism ID, Bacteria STREPTOCOCCUS MITIS/ORALIS  Final      Susceptibility   Streptococcus mitis/oralis - MIC*    PENICILLIN <=0.06 SENSITIVE Sensitive     CEFTRIAXONE  <=0.12 SENSITIVE Sensitive     LEVOFLOXACIN  0.5 SENSITIVE Sensitive     VANCOMYCIN 0.5 SENSITIVE Sensitive     * STREPTOCOCCUS  MITIS/ORALIS  Blood Culture ID Panel (Reflexed)     Status: Abnormal   Collection Time: 12/20/23 11:26 AM  Result Value Ref Range Status   Enterococcus faecalis NOT DETECTED NOT DETECTED Final   Enterococcus Faecium NOT DETECTED NOT DETECTED Final   Listeria monocytogenes NOT DETECTED NOT DETECTED Final   Staphylococcus species NOT DETECTED NOT DETECTED Final   Staphylococcus aureus (BCID) NOT DETECTED NOT DETECTED Final   Staphylococcus epidermidis NOT DETECTED NOT DETECTED Final   Staphylococcus lugdunensis NOT DETECTED NOT DETECTED Final   Streptococcus species DETECTED (A) NOT DETECTED Final    Comment: Not Enterococcus species, Streptococcus agalactiae, Streptococcus pyogenes, or Streptococcus pneumoniae. CRITICAL RESULT CALLED TO, READ BACK BY AND VERIFIED WITH: PHARMD J LEDFORD 12/21/2023 @ 0412 BY AB    Streptococcus agalactiae NOT DETECTED NOT DETECTED Final   Streptococcus pneumoniae NOT DETECTED NOT DETECTED Final   Streptococcus pyogenes NOT DETECTED NOT DETECTED Final   A.calcoaceticus-baumannii NOT DETECTED NOT DETECTED Final   Bacteroides fragilis NOT DETECTED NOT DETECTED Final   Enterobacterales NOT DETECTED NOT DETECTED Final   Enterobacter cloacae complex NOT DETECTED NOT DETECTED Final   Escherichia coli NOT DETECTED NOT DETECTED Final   Klebsiella aerogenes NOT DETECTED NOT DETECTED Final   Klebsiella oxytoca NOT DETECTED NOT DETECTED Final   Klebsiella pneumoniae NOT DETECTED NOT DETECTED Final   Proteus species NOT DETECTED NOT DETECTED Final   Salmonella species NOT DETECTED NOT DETECTED Final   Serratia marcescens NOT DETECTED NOT DETECTED Final   Haemophilus influenzae NOT DETECTED NOT DETECTED Final   Neisseria meningitidis NOT DETECTED NOT DETECTED Final   Pseudomonas aeruginosa NOT DETECTED NOT DETECTED Final   Stenotrophomonas maltophilia NOT DETECTED NOT DETECTED Final   Candida albicans NOT DETECTED NOT DETECTED Final   Candida auris NOT DETECTED NOT  DETECTED Final   Candida glabrata NOT DETECTED NOT DETECTED Final   Candida krusei NOT DETECTED NOT DETECTED Final   Candida parapsilosis NOT DETECTED NOT DETECTED Final   Candida tropicalis NOT DETECTED NOT DETECTED Final   Cryptococcus neoformans/gattii NOT DETECTED NOT DETECTED Final    Comment: Performed at [facility], [address] [place], [place] [address]  Culture, blood (Routine X 2) w Reflex to ID Panel     Status: None   Collection Time: 12/20/23 11:27 AM   Specimen: BLOOD RIGHT ARM  Result Value Ref Range Status  Specimen Description BLOOD RIGHT ARM  Final   Special Requests   Final    BOTTLES DRAWN AEROBIC AND ANAEROBIC Blood Culture adequate volume   Culture   Final    NO GROWTH 5 DAYS Performed at [facility], [address], [place], [place] [address]    Report Status 12/25/2023 FINAL  Final  Culture, blood (Routine X 2) w Reflex to ID Panel     Status: None (Preliminary result)   Collection Time: 12/22/23  2:32 AM   Specimen: BLOOD RIGHT HAND  Result Value Ref Range Status   Specimen Description BLOOD RIGHT HAND  Final   Special Requests   Final    BOTTLES DRAWN AEROBIC AND ANAEROBIC Blood Culture adequate volume   Culture   Final    NO GROWTH 3 DAYS Performed at [facility], [address] [place], [place] [address]    Report Status PENDING  Incomplete  Culture, blood (Routine X 2) w Reflex to ID Panel     Status: None (Preliminary result)   Collection Time: 12/22/23  2:44 AM   Specimen: BLOOD LEFT ARM  Result Value Ref Range Status   Specimen Description BLOOD LEFT ARM  Final   Special Requests   Final    BOTTLES DRAWN AEROBIC AND ANAEROBIC Blood Culture adequate volume   Culture   Final    NO GROWTH 3 DAYS Performed at [facility], [address] [place], [place] [address]    Report Status PENDING  Incomplete     Radiology Studies: VAS US  LOWER EXTREMITY VENOUS (DVT) Result Date: 12/25/2023  Lower Venous DVT Study  Patient Name:  MIR FULLILOVE  Date of Exam:   12/25/2023 Medical Rec #: [id number]     Accession #:    [id number] Date of Birth: 1932/06/22    Patient Gender: M Patient Age:   89 years Exam Location:  [facility] Procedure:      VAS US  LOWER EXTREMITY VENOUS (DVT) Referring Phys: Deforest Fast --------------------------------------------------------------------------------  Indications: Pain, Swelling, and Edema.  Risk Factors: CHF. Comparison Study: 10/31/23 - Venous reflux study was positive for right leg                   venous insufficiency. Negative DVT. Performing Technologist: Franky Ivanoff Sturdivant-Jones RDMS, RVT  Examination Guidelines: A complete evaluation includes B-mode imaging, spectral Doppler, color Doppler, and power Doppler as needed of all accessible portions of each vessel. Bilateral testing is considered an integral part of a complete examination. Limited examinations for reoccurring indications may be performed as noted. The reflux portion of the exam is performed with the patient in reverse Trendelenburg.  +---------+---------------+---------+-----------+----------+--------------+ RIGHT    CompressibilityPhasicitySpontaneityPropertiesThrombus Aging +---------+---------------+---------+-----------+----------+--------------+ CFV      Full           Yes      Yes                                 +---------+---------------+---------+-----------+----------+--------------+ SFJ      Full                                                        +---------+---------------+---------+-----------+----------+--------------+ FV Prox  Full                                                        +---------+---------------+---------+-----------+----------+--------------+  FV Mid   Full                                                        +---------+---------------+---------+-----------+----------+--------------+ FV DistalFull                                                         +---------+---------------+---------+-----------+----------+--------------+ PFV      Full                                                        +---------+---------------+---------+-----------+----------+--------------+ POP      Full           Yes      Yes                                 +---------+---------------+---------+-----------+----------+--------------+ PTV      Full                                                        +---------+---------------+---------+-----------+----------+--------------+ PERO     Full                                                        +---------+---------------+---------+-----------+----------+--------------+   +---------+---------------+---------+-----------+----------+--------------+ LEFT     CompressibilityPhasicitySpontaneityPropertiesThrombus Aging +---------+---------------+---------+-----------+----------+--------------+ CFV      Full           Yes      Yes                                 +---------+---------------+---------+-----------+----------+--------------+ SFJ      Full                                                        +---------+---------------+---------+-----------+----------+--------------+ FV Prox  Full                                                        +---------+---------------+---------+-----------+----------+--------------+ FV Mid   Full                                                        +---------+---------------+---------+-----------+----------+--------------+  FV DistalFull                                                        +---------+---------------+---------+-----------+----------+--------------+ PFV      Full                                                        +---------+---------------+---------+-----------+----------+--------------+ POP      Full           Yes      Yes                                  +---------+---------------+---------+-----------+----------+--------------+ PTV      Full                                                        +---------+---------------+---------+-----------+----------+--------------+ PERO     Full                                                        +---------+---------------+---------+-----------+----------+--------------+     Summary: BILATERAL: - No evidence of deep vein thrombosis seen in the lower extremities, bilaterally. -No evidence of popliteal cyst, bilaterally.   *See table(s) above for measurements and observations. Electronically signed by Genny Kid MD on 12/25/2023 at 4:39:18 PM.    Final    DG HIP UNILAT WITH PELVIS 1V RIGHT Result Date: 12/25/2023 CLINICAL DATA:  Right hip pain EXAM: DG HIP (WITH OR WITHOUT PELVIS) 1V RIGHT COMPARISON:  None Available. FINDINGS: No acute fracture or dislocation. Degenerative changes pubic symphysis, both hips, SI joints and lower lumbar spine. IMPRESSION: No acute fracture or dislocation. Electronically Signed   By: Rozell Cornet M.D.   On: 12/25/2023 00:10     Scheduled Meds:  amoxicillin   1,000 mg Oral Q8H   dextromethorphan -guaiFENesin   2 tablet Oral BID   diltiazem   120 mg Oral Daily   feeding supplement  237 mL Oral BID BM   furosemide   40 mg Oral Daily   levothyroxine   125 mcg Oral QAC breakfast   lidocaine   1 patch Transdermal Q24H   pantoprazole   40 mg Oral BID   potassium chloride   20 mEq Oral Daily   senna-docusate  1 tablet Oral BID   Warfarin - Pharmacist Dosing Inpatient   Does not apply q1600   Continuous Infusions:     LOS: 9 days    Time spent:    Deforest Fast, MD [facility]   12/26/2023, 9:44 AM

## 2023-12-26 NOTE — Plan of Care (Signed)
  Problem: Education: Goal: Knowledge of General Education information will improve Description: Including pain rating scale, medication(s)/side effects and non-pharmacologic comfort measures Outcome: Progressing   Problem: Health Behavior/Discharge Planning: Goal: Ability to manage health-related needs will improve Outcome: Progressing   Problem: Clinical Measurements: Goal: Cardiovascular complication will be avoided Outcome: Progressing   Problem: Nutrition: Goal: Adequate nutrition will be maintained Outcome: Not Progressing   Problem: Elimination: Goal: Will not experience complications related to urinary retention Outcome: Progressing   Problem: Pain Managment: Goal: General experience of comfort will improve and/or be controlled Outcome: Progressing

## 2023-12-26 NOTE — Progress Notes (Signed)
 PHARMACY - ANTICOAGULATION CONSULT NOTE  Pharmacy Consult for Warfarin  Indication: atrial fibrillation and mAVR  Allergies  Allergen Reactions   Carvedilol  Shortness Of Breath    wheezing   Lopid [Gemfibrozil] Other (See Comments)    transaminitis   Monopril [Fosinopril] Other (See Comments)    transaminitis   Vicodin [Hydrocodone-Acetaminophen ]     Severe sensitivity   Phenergan [Promethazine Hcl] Other (See Comments)    Severe somnolence.   Zocor [Simvastatin]     Short memory loss.   Benicar [Olmesartan]     Abdominal cramping and increased stools/ irritable bowel   Codeine Nausea And Vomiting   Prilosec [Omeprazole]     dyspepsia    Patient Measurements: Height: 5\' 10"  (177.8 cm) Weight: 77 kg (169 lb 12.1 oz) IBW/kg (Calculated) : 73 HEPARIN  DW (KG): 82.3  Vital Signs: Temp: 97.7 F (36.5 C) (04/24 0832) Temp Source: Oral (04/24 0832) BP: 139/46 (04/24 0832) Pulse Rate: 93 (04/24 0832)  Labs: Recent Labs    12/23/23 1836 12/24/23 0816 12/24/23 0816 12/25/23 0233 12/26/23 0311  HGB  --  9.3*   < > 9.2* 8.7*  HCT  --  30.2*  --  29.6* 28.6*  PLT  --  181  --  219 236  LABPROT  --  27.0*  --  28.8* 38.5*  INR  --  2.5*  --  2.7* 3.9*  HEPARINUNFRC 0.80* >1.10*  --   --   --   CREATININE  --  1.58*  --  1.71* 1.69*   < > = values in this interval not displayed.    Estimated Creatinine Clearance: 30.6 mL/min (A) (by C-G formula based on SCr of 1.69 mg/dL (H)).  Assessment: Jason Santana a 89 y.o. male presented with GIB, now s/p EGD 4/18. In EGD op note, GI states it is ok to resume warfarin on 4/19, confirmed OK to restart with TRH. OF note, patient is currently on azithromycin  for possible pneumonia. Given this DDI, which would likely lead to increased INR, recent GIB with diminished PO intake, and INR on last check 2.5 (4/18), will initiate warfarin at lower than PTA dosing and monitor closely.   Anticoagulation PTA: Warfarin PTA dosing: 5 mg M/F, 7.5  mg all other days  Notable DDI's - azithromycin  500 mg 4/18>19 (increase warfarin concentrations).  INR today with quick jump to 3.9.  No overt bleeding or complications noted, CBC stable.  Goal of Therapy:  INR 2-3 Heparin  level 0.3-0.7 units/ml Monitor platelets by anticoagulation protocol: Yes   Plan:  No warfarin today due to high INR. Check INR daily Continue to monitor H&H and platelets  Thank you for allowing pharmacy to be a part of this patient's care.  Joanell Mowers, Davey Erp, BCCP Clinical Pharmacist  12/26/2023 9:31 AM   [facility] pharmacy phone numbers are listed on [email or web address]

## 2023-12-26 NOTE — Progress Notes (Signed)
 PHARMACY - ANTICOAGULATION CONSULT NOTE  Pharmacy Consult for Warfarin - on  hold; heparin  when INR < 2 Indication: atrial fibrillation and mAVR  Allergies  Allergen Reactions   Carvedilol  Shortness Of Breath    wheezing   Lopid [Gemfibrozil] Other (See Comments)    transaminitis   Monopril [Fosinopril] Other (See Comments)    transaminitis   Vicodin [Hydrocodone-Acetaminophen ]     Severe sensitivity   Phenergan [Promethazine Hcl] Other (See Comments)    Severe somnolence.   Zocor [Simvastatin]     Short memory loss.   Benicar [Olmesartan]     Abdominal cramping and increased stools/ irritable bowel   Codeine Nausea And Vomiting   Prilosec [Omeprazole]     dyspepsia    Patient Measurements: Height: 5\' 10"  (177.8 cm) Weight: 77 kg (169 lb 12.1 oz) IBW/kg (Calculated) : 73 HEPARIN  DW (KG): 82.3  Vital Signs: Temp: 98.9 F (37.2 C) (04/24 1146) Temp Source: Oral (04/24 1146) BP: 117/95 (04/24 1146) Pulse Rate: 93 (04/24 1146)  Labs: Recent Labs    12/23/23 1836 12/24/23 0816 12/24/23 0816 12/25/23 0233 12/26/23 0311  HGB  --  9.3*   < > 9.2* 8.7*  HCT  --  30.2*  --  29.6* 28.6*  PLT  --  181  --  219 236  LABPROT  --  27.0*  --  28.8* 38.5*  INR  --  2.5*  --  2.7* 3.9*  HEPARINUNFRC 0.80* >1.10*  --   --   --   CREATININE  --  1.58*  --  1.71* 1.69*   < > = values in this interval not displayed.    Estimated Creatinine Clearance: 30.6 mL/min (A) (by C-G formula based on SCr of 1.69 mg/dL (H)).  Assessment: Jason Santana a 89 y.o. male presented with GIB, now s/p EGD 4/18. In EGD op note, GI states it is ok to resume warfarin on 4/19, confirmed OK to restart with TRH. OF note, patient is currently on azithromycin  for possible pneumonia. Given this DDI, which would likely lead to increased INR, recent GIB with diminished PO intake, and INR on last check 2.5 (4/18), will initiate warfarin at lower than PTA dosing and monitor closely.   Anticoagulation PTA:  Warfarin PTA dosing: 5 mg M/F, 7.5 mg all other days  Notable DDI's - azithromycin  500 mg 4/18>19 (increase warfarin concentrations).  INR today with quick jump to 3.9.  No overt bleeding or complications noted, CBC stable.  This afternoon patient with suspected intramuscular hematoma, pharmacy asked to hold Coumadin  and start IV heparin  once INR down < 2.  Goal of Therapy:  INR 2-3 Heparin  level 0.3-0.7 units/ml Monitor platelets by anticoagulation protocol: Yes   Plan:  No warfarin Check INR daily, will start IV heparin  once INR falls <=2  Continue to monitor H&H and platelets  Thank you for allowing pharmacy to be a part of this patient's care.  Joanell Mowers, Davey Erp, BCCP Clinical Pharmacist  12/26/2023 1:35 PM   [facility] pharmacy phone numbers are listed on [email or web address]

## 2023-12-27 DIAGNOSIS — B954 Other streptococcus as the cause of diseases classified elsewhere: Secondary | ICD-10-CM | POA: Diagnosis not present

## 2023-12-27 DIAGNOSIS — K922 Gastrointestinal hemorrhage, unspecified: Secondary | ICD-10-CM | POA: Diagnosis not present

## 2023-12-27 DIAGNOSIS — D500 Iron deficiency anemia secondary to blood loss (chronic): Secondary | ICD-10-CM | POA: Diagnosis not present

## 2023-12-27 DIAGNOSIS — R7881 Bacteremia: Secondary | ICD-10-CM | POA: Diagnosis not present

## 2023-12-27 DIAGNOSIS — I5033 Acute on chronic diastolic (congestive) heart failure: Secondary | ICD-10-CM | POA: Diagnosis not present

## 2023-12-27 LAB — CBC
HCT: 26.7 % — ABNORMAL LOW (ref 39.0–52.0)
HCT: 26.9 % — ABNORMAL LOW (ref 39.0–52.0)
Hemoglobin: 8.3 g/dL — ABNORMAL LOW (ref 13.0–17.0)
Hemoglobin: 8.1 g/dL — ABNORMAL LOW (ref 13.0–17.0)
MCH: 33.5 pg (ref 26.0–34.0)
MCH: 32.1 pg (ref 26.0–34.0)
MCHC: 31.1 g/dL (ref 30.0–36.0)
MCHC: 30.1 g/dL (ref 30.0–36.0)
MCV: 107.7 fL — ABNORMAL HIGH (ref 80.0–100.0)
MCV: 106.7 fL — ABNORMAL HIGH (ref 80.0–100.0)
Platelets: 235 10*3/uL (ref 150–400)
Platelets: 230 10*3/uL (ref 150–400)
RBC: 2.48 MIL/uL — ABNORMAL LOW (ref 4.22–5.81)
RBC: 2.52 MIL/uL — ABNORMAL LOW (ref 4.22–5.81)
RDW: 15.9 % — ABNORMAL HIGH (ref 11.5–15.5)
RDW: 16.1 % — ABNORMAL HIGH (ref 11.5–15.5)
WBC: 13.2 10*3/uL — ABNORMAL HIGH (ref 4.0–10.5)
WBC: 12.8 10*3/uL — ABNORMAL HIGH (ref 4.0–10.5)
nRBC: 0.4 % — ABNORMAL HIGH (ref 0.0–0.2)
nRBC: 0.4 % — ABNORMAL HIGH (ref 0.0–0.2)

## 2023-12-27 LAB — CULTURE, BLOOD (ROUTINE X 2)
Culture: NO GROWTH
Culture: NO GROWTH
Special Requests: ADEQUATE
Special Requests: ADEQUATE

## 2023-12-27 LAB — PROTIME-INR
INR: 5.3 (ref 0.8–1.2)
INR: 2.6 — ABNORMAL HIGH (ref 0.8–1.2)
Prothrombin Time: 49 s — ABNORMAL HIGH (ref 11.4–15.2)
Prothrombin Time: 27.7 s — ABNORMAL HIGH (ref 11.4–15.2)

## 2023-12-27 MED ORDER — CARMEX CLASSIC LIP BALM EX OINT: TOPICAL_OINTMENT | CUTANEOUS | Status: DC | PRN

## 2023-12-27 MED ORDER — POLYETHYLENE GLYCOL 3350 17 G PO PACK: 17.0000 g | PACK | Freq: Every day | ORAL | Status: DC | PRN

## 2023-12-27 MED ORDER — VITAMIN K1 10 MG/ML IJ SOLN
2.5000 mg | Freq: Once | INTRAVENOUS | Status: AC
  Administered 2023-12-27: 2.5 mg via INTRAVENOUS
  Filled 2023-12-27: qty 0.25

## 2023-12-27 NOTE — Progress Notes (Signed)
 Physical Therapy Treatment Patient Details Name: Jason Santana MRN: [id number] DOB: August 01, 1932 Today's Date: 12/27/2023   History of Present Illness Pt is an 89 y/o M presenting to ED on 4/15 wtih BLE edema x1 week and worsening dyspnea, admitted for acute GI bleed. CXR with LLL PNA/bacteremia.  4/18 EGD and upper GI endoscopy. 4/23 Santana hip pain, plain films negative. PMH includes A fib, A flutter, AVR, PVCs, CKD IIIA, HTN, cryptogenic cirrhosis, GERD, hypothryoidism    PT Comments  Pt resting in bed on arrival, pleasant and agreeable to session despite continued c/o increased hip pain with all mobility. Pt requiring max A +2 to come to sitting EOB due to pain. Once seated up EOB pt initially requiring mod A to maintain sitting balance, however with cues for midline posture and anterior weight shift pt able to maintain with grossly CGA. Pt able to perform seated RLE exercises for increased ROM and strength maintenance. Pt tolerating ~8-10 mins seated up EOB before requesting to return to supine. Pt declining transfer attempts despite encouragement. Current plan remains appropriate pending pt progress. Pt continues to benefit from skilled PT services to progress toward functional mobility goals.       If plan is discharge home, recommend the following: A lot of help with walking and/or transfers;A lot of help with bathing/dressing/bathroom;Assistance with cooking/housework;Assist for transportation;Help with stairs or ramp for entrance   Can travel by private vehicle        Equipment Recommendations  Rolling walker (2 wheels)    Recommendations for Other Services       Precautions / Restrictions Precautions Precautions: Fall Recall of Precautions/Restrictions: Intact Restrictions Weight Bearing Restrictions Per Provider Order: No     Mobility  Bed Mobility Overal bed mobility: Needs Assistance Bed Mobility: Supine to Sit     Supine to sit: Max assist, +2 for physical assistance, Used  rails, HOB elevated Sit to supine: Max assist, +2 for physical assistance   General bed mobility comments: step by step cues to bring LEs to EOB, max A +2 to elevate trunk and square hips at EOB    Transfers                   General transfer comment: unable due to Santana hip pain    Ambulation/Gait                   Stairs             Wheelchair Mobility     Tilt Bed    Modified Rankin (Stroke Patients Only)       Balance Overall balance assessment: Needs assistance, Mild deficits observed, not formally tested Sitting-balance support: Bilateral upper extremity supported, Feet supported Sitting balance-Leahy Scale: Poor Sitting balance - Comments: initially needing mod-max A to maintain balance as pt with posterior and Santana lateral lean, cues for midline and to shift weight anterior, fading to CGA with time                                    Communication Communication Communication: No apparent difficulties  Cognition Arousal: Alert Behavior During Therapy: WFL for tasks assessed/performed                             Following commands: Intact      Cueing Cueing Techniques: Verbal cues  Exercises  General Exercises - Lower Extremity Long Arc Quad: AROM, AAROM, Right, 10 reps, Seated Hip Flexion/Marching: AROM, AAROM, Right, 5 reps, Seated    General Comments General comments (skin integrity, edema, etc.): VSS on supplemental O2      Pertinent Vitals/Pain Pain Assessment Pain Assessment: Faces Faces Pain Scale: Hurts whole lot Pain Location: Santana hip and LE with movement Pain Descriptors / Indicators: Discomfort, Grimacing, Sore, Tender Pain Intervention(s): Monitored during session, Limited activity within patient's tolerance    Home Living                          Prior Function            PT Goals (current goals can now be found in the care plan section) Acute Rehab PT Goals PT Goal Formulation:  With patient/family Time For Goal Achievement: 01/04/24 Progress towards PT goals: Progressing toward goals    Frequency    Min 2X/week      PT Plan      Co-evaluation              AM-PAC PT "6 Clicks" Mobility   Outcome Measure  Help needed turning from your back to your side while in a flat bed without using bedrails?: A Lot Help needed moving from lying on your back to sitting on the side of a flat bed without using bedrails?: Total Help needed moving to and from a bed to a chair (including a wheelchair)?: Total Help needed standing up from a chair using your arms (e.g., wheelchair or bedside chair)?: Total Help needed to walk in hospital room?: Total Help needed climbing 3-5 steps with a railing? : Total 6 Click Score: 7    End of Session Equipment Utilized During Treatment: Oxygen Activity Tolerance: Patient limited by pain Patient left: in bed;with call bell/phone within reach;with bed alarm set;with family/visitor present Nurse Communication: Mobility status PT Visit Diagnosis: Other abnormalities of gait and mobility (R26.89);Muscle weakness (generalized) (M62.81)     Time: 1413-1430 PT Time Calculation (min) (ACUTE ONLY): 17 min  Charges:    $Gait Training: 8-22 mins PT General Charges $$ ACUTE PT VISIT: 1 Visit                     Jason Santana. PTA Acute Rehabilitation Services Office: [phone]   Jason Santana 12/27/2023, 2:42 PM

## 2023-12-27 NOTE — Progress Notes (Addendum)
 PROGRESS NOTE    Jason Santana  BJY:[id number] DOB: 10/30/1931 DOA: 12/17/2023 PCP: Laneta Pintos, MD  89/M w CAD, A-fib/flutter and mechanical aortic valve replacement on Coumadin , CHF, CKD stage IIIa, cryptogenic cirrhosis, GERD, hypothyroidism presenting with complaints of shortness of breath and bilateral lower extremity edema.  Reports chronically dark stools. In ED, VSS, labs w/ Hb 6.8, creatinine 1.4 , albumin 2.5,  troponin 255> 251, BNP 252, INR 3.7.  FOBT positive , CXR wcardiomegaly and small left pleural effusion.   Gastroenterology also consulted -Dr. Elvin Hammer with [facility]. Patient was given IV Lasix  40 mg and IV Protonix  80 mg in the ED. 1 unit PRBCs ordered. - 4/22-23 developed right hip pain, CT on 4/24 noted large right psoas fluid collection likely hematoma unable to exclude infection, IR consulted, warfarin held - 4/24, IR felt imaging most consistent with intramuscular hematoma with supratherapeutic INR and did not recommend aspiration  Subjective: Continues to have right groin/hip pain  Assessment and Plan:  New large right psoas hematoma - Cannot rule out infection - Noted on CT, IR consulted for CT-guided aspiration, they felt this is likely intramuscular hematoma and suggested against aspiration, in the setting of coagulopathy - INR supratherapeutic this morning, vitamin K given, warfarin on hold - Start heparin  without bolus when INR is less than 2 - Monitor hemoglobin twice a day - Appreciate infectious disease input, recommended to continue IV Ancef  while hospitalized and then transition to oral amoxicillin  1 g twice daily to complete 4 weeks of therapy - Discussed with ID and spouse, overall prognosis is poor, will request palliative care consult  Acute on chronic diastolic CHF  -Echo with EF 60 to 65%, RV  low normal, RVSP 53,7 mmHg. LA with moderate dilation, moderate tricuspid regurgitation, St Jude mechanical valve at the aortic position, -Cards following,  diuresed with IV Lasix , down 10 LB, now switched to oral Lasix  - Continue Cardizem   Mechanical aortic valve -holding warfarin   Strep mitis bacteremia -Presumed to be related to recent dental extraction 4 weeks ago -TTE negative -Concern regarding TEE in the setting of recent GI bleed, anticoagulation and anemia, infectious disease following treated with IV ceftriaxone  initially -Repeat blood cultures are negative - Was transitioned to oral amoxicillin , however with large right psoas fluid collection which is likely hematoma but unable to exclude infection now back on IV Ancef , transition to oral amoxicillin  at discharge complete 4-week therapy   Acute upper GI bleed/acute blood loss anemia -04/18 EGD, with 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  - Status post IV iron  - Continue to follow hemoglobin trend.   Paroxysmal atrial flutter (HCC) - Continue Cardizem , warfarin held   CAD (coronary artery disease) of artery bypass graft -No chest pain, no acute coronary syndrome.  - 2D echo demonstrated no wall motion abnormalities.   Cirrhosis of liver without ascites (HCC) - Diuretics as above   Acute kidney injury on stage 3a chronic kidney disease (HCC) -Stable and at baseline   Hypothyroidism -Continue Synthroid .  DVT prophylaxis: warfarin Code Status: DNR Family Communication: None present, called and updated grand daughter Disposition Plan: To be determined, prognosis is poor  Consultants: Cards, infectious disease    Objective: Vitals:   12/26/23 2300 12/27/23 0400 12/27/23 0757 12/27/23 1101  BP: (!) 125/53 (!) 131/49 129/60 122/63  Pulse: 94 86 94 87  Resp: 16 15 17 19   Temp: 98.4 F (36.9 C) 97.6 F (36.4 C) 98.1 F (36.7  C) 97.9 F (36.6 C)  TempSrc: Oral Oral Oral Oral  SpO2: 94% 92% 90% 100%  Weight:  77.9 kg    Height:        Intake/Output Summary (Last 24 hours) at 12/27/2023 1156 Last data filed  at 12/27/2023 8119 Gross per 24 hour  Intake 566.92 ml  Output 550 ml  Net 16.92 ml   Filed Weights   12/25/23 0340 12/26/23 0421 12/27/23 0400  Weight: 77.4 kg 77 kg 77.9 kg    Examination: Gen: Awake, Alert, Oriented X 3, chronically ill-appearing HEENT: No JVD Lungs: Decreased breath sound at the bases CVS: S1S2/irregular rhythm, metallic click Abd: soft, Non tender, non distended, BS present Extremities: No edema, right groin with mildly painful range of motion Skin: no new rashes on exposed skin     Data Reviewed:   CBC: Recent Labs  Lab 12/22/23 0232 12/23/23 0232 12/24/23 0816 12/25/23 0233 12/26/23 0311 12/27/23 0242  WBC 4.8 6.0 6.7 9.1 12.7* 13.2*  NEUTROABS 3.1  --   --   --   --   --   HGB 8.9* 8.3* 9.3* 9.2* 8.7* 8.3*  HCT 28.6* 27.5* 30.2* 29.6* 28.6* 26.7*  MCV 105.1* 105.4* 104.5* 105.0* 106.3* 107.7*  PLT 162 155 181 219 236 235   Basic Metabolic Panel: Recent Labs  Lab 12/22/23 0232 12/23/23 0232 12/24/23 0816 12/25/23 0233 12/26/23 0311  NA 137 137 137 138 138  K 4.1 4.2 4.2 4.9 5.1  CL 108 110 106 106 107  CO2 23 23 23  21* 23  GLUCOSE 125* 90 95 122* 131*  BUN 26* 21 20 27* 34*  CREATININE 1.70* 1.48* 1.58* 1.71* 1.69*  CALCIUM  8.0* 8.0* 8.1* 8.3* 8.7*  PHOS  --  2.7  --   --   --    GFR: Estimated Creatinine Clearance: 30.6 mL/min (A) (by C-G formula based on SCr of 1.69 mg/dL (H)). Liver Function Tests: Recent Labs  Lab 12/23/23 0232 12/26/23 0311  AST  --  90*  ALT  --  24  ALKPHOS  --  86  BILITOT  --  0.8  PROT  --  6.2*  ALBUMIN 2.0* 2.5*   No results for input(s): "LIPASE", "AMYLASE" in the last 168 hours. No results for input(s): "AMMONIA" in the last 168 hours. Coagulation Profile: Recent Labs  Lab 12/23/23 0232 12/24/23 0816 12/25/23 0233 12/26/23 0311 12/27/23 0242  INR 1.8* 2.5* 2.7* 3.9* 5.3*   Cardiac Enzymes: No results for input(s): "CKTOTAL", "CKMB", "CKMBINDEX", "TROPONINI" in the last 168  hours. BNP (last 3 results) No results for input(s): "PROBNP" in the last 8760 hours. HbA1C: No results for input(s): "HGBA1C" in the last 72 hours. CBG: No results for input(s): "GLUCAP" in the last 168 hours. Lipid Profile: No results for input(s): "CHOL", "HDL", "LDLCALC", "TRIG", "CHOLHDL", "LDLDIRECT" in the last 72 hours. Thyroid  Function Tests: No results for input(s): "TSH", "T4TOTAL", "FREET4", "T3FREE", "THYROIDAB" in the last 72 hours. Anemia Panel: No results for input(s): "VITAMINB12", "FOLATE", "FERRITIN", "TIBC", "IRON ", "RETICCTPCT" in the last 72 hours. Urine analysis:    Component Value Date/Time   COLORURINE STRAW (A) 12/20/2023 1700   APPEARANCEUR CLEAR 12/20/2023 1700   LABSPEC 1.005 12/20/2023 1700   PHURINE 7.0 12/20/2023 1700   GLUCOSEU NEGATIVE 12/20/2023 1700   HGBUR NEGATIVE 12/20/2023 1700   BILIRUBINUR NEGATIVE 12/20/2023 1700   BILIRUBINUR negative 12/15/2021 1002   KETONESUR NEGATIVE 12/20/2023 1700   PROTEINUR NEGATIVE 12/20/2023 1700   UROBILINOGEN 1.0 12/15/2021 1002  NITRITE NEGATIVE 12/20/2023 1700   LEUKOCYTESUR TRACE (A) 12/20/2023 1700   Sepsis Labs: @LABRCNTIP (procalcitonin:4,lacticidven:4)  ) Recent Results (from the past 240 hours)  MRSA Next Gen by PCR, Nasal     Status: None   Collection Time: 12/18/23  4:08 PM   Specimen: Nasal Mucosa; Nasal Swab  Result Value Ref Range Status   MRSA by PCR Next Gen NOT DETECTED NOT DETECTED Final    Comment: (NOTE) The GeneXpert MRSA Assay (FDA approved for NASAL specimens only), is one component of a comprehensive MRSA colonization surveillance program. It is not intended to diagnose MRSA infection nor to guide or monitor treatment for MRSA infections. Test performance is not FDA approved in patients less than 54 years old. Performed at [facility], [address] [place], [place] [address]   Culture, blood (Routine X 2) w Reflex to ID Panel     Status: Abnormal   Collection  Time: 12/20/23 11:26 AM   Specimen: BLOOD LEFT ARM  Result Value Ref Range Status   Specimen Description BLOOD LEFT ARM  Final   Special Requests   Final    BOTTLES DRAWN AEROBIC AND ANAEROBIC Blood Culture adequate volume   Culture  Setup Time   Final    GRAM POSITIVE COCCI IN CHAINS ANAEROBIC BOTTLE ONLY CRITICAL RESULT CALLED TO, READ BACK BY AND VERIFIED WITH: PHARMD J LEDFORD 12/21/2023 @ 0412 BY AB Performed at [facility], [address] [place], [place] [address]    Culture STREPTOCOCCUS MITIS/ORALIS (A)  Final   Report Status 12/23/2023 FINAL  Final   Organism ID, Bacteria STREPTOCOCCUS MITIS/ORALIS  Final      Susceptibility   Streptococcus mitis/oralis - MIC*    PENICILLIN <=0.06 SENSITIVE Sensitive     CEFTRIAXONE  <=0.12 SENSITIVE Sensitive     LEVOFLOXACIN  0.5 SENSITIVE Sensitive     VANCOMYCIN 0.5 SENSITIVE Sensitive     * STREPTOCOCCUS MITIS/ORALIS  Blood Culture ID Panel (Reflexed)     Status: Abnormal   Collection Time: 12/20/23 11:26 AM  Result Value Ref Range Status   Enterococcus faecalis NOT DETECTED NOT DETECTED Final   Enterococcus Faecium NOT DETECTED NOT DETECTED Final   Listeria monocytogenes NOT DETECTED NOT DETECTED Final   Staphylococcus species NOT DETECTED NOT DETECTED Final   Staphylococcus aureus (BCID) NOT DETECTED NOT DETECTED Final   Staphylococcus epidermidis NOT DETECTED NOT DETECTED Final   Staphylococcus lugdunensis NOT DETECTED NOT DETECTED Final   Streptococcus species DETECTED (A) NOT DETECTED Final    Comment: Not Enterococcus species, Streptococcus agalactiae, Streptococcus pyogenes, or Streptococcus pneumoniae. CRITICAL RESULT CALLED TO, READ BACK BY AND VERIFIED WITH: PHARMD J LEDFORD 12/21/2023 @ 0412 BY AB    Streptococcus agalactiae NOT DETECTED NOT DETECTED Final   Streptococcus pneumoniae NOT DETECTED NOT DETECTED Final   Streptococcus pyogenes NOT DETECTED NOT DETECTED Final   A.calcoaceticus-baumannii NOT DETECTED  NOT DETECTED Final   Bacteroides fragilis NOT DETECTED NOT DETECTED Final   Enterobacterales NOT DETECTED NOT DETECTED Final   Enterobacter cloacae complex NOT DETECTED NOT DETECTED Final   Escherichia coli NOT DETECTED NOT DETECTED Final   Klebsiella aerogenes NOT DETECTED NOT DETECTED Final   Klebsiella oxytoca NOT DETECTED NOT DETECTED Final   Klebsiella pneumoniae NOT DETECTED NOT DETECTED Final   Proteus species NOT DETECTED NOT DETECTED Final   Salmonella species NOT DETECTED NOT DETECTED Final   Serratia marcescens NOT DETECTED NOT DETECTED Final   Haemophilus influenzae NOT DETECTED NOT DETECTED Final  Neisseria meningitidis NOT DETECTED NOT DETECTED Final   Pseudomonas aeruginosa NOT DETECTED NOT DETECTED Final   Stenotrophomonas maltophilia NOT DETECTED NOT DETECTED Final   Candida albicans NOT DETECTED NOT DETECTED Final   Candida auris NOT DETECTED NOT DETECTED Final   Candida glabrata NOT DETECTED NOT DETECTED Final   Candida krusei NOT DETECTED NOT DETECTED Final   Candida parapsilosis NOT DETECTED NOT DETECTED Final   Candida tropicalis NOT DETECTED NOT DETECTED Final   Cryptococcus neoformans/gattii NOT DETECTED NOT DETECTED Final    Comment: Performed at [facility], [address], [place], [place] [address]  Culture, blood (Routine X 2) w Reflex to ID Panel     Status: None   Collection Time: 12/20/23 11:27 AM   Specimen: BLOOD RIGHT ARM  Result Value Ref Range Status   Specimen Description BLOOD RIGHT ARM  Final   Special Requests   Final    BOTTLES DRAWN AEROBIC AND ANAEROBIC Blood Culture adequate volume   Culture   Final    NO GROWTH 5 DAYS Performed at [facility], [address], [place], [place] [address]    Report Status 12/25/2023 FINAL  Final  Culture, blood (Routine X 2) w Reflex to ID Panel     Status: None   Collection Time: 12/22/23  2:32 AM   Specimen: BLOOD RIGHT HAND  Result Value Ref Range Status   Specimen Description BLOOD  RIGHT HAND  Final   Special Requests   Final    BOTTLES DRAWN AEROBIC AND ANAEROBIC Blood Culture adequate volume   Culture   Final    NO GROWTH 5 DAYS Performed at [facility], [address], [place], [place] [address]    Report Status 12/27/2023 FINAL  Final  Culture, blood (Routine X 2) w Reflex to ID Panel     Status: None   Collection Time: 12/22/23  2:44 AM   Specimen: BLOOD LEFT ARM  Result Value Ref Range Status   Specimen Description BLOOD LEFT ARM  Final   Special Requests   Final    BOTTLES DRAWN AEROBIC AND ANAEROBIC Blood Culture adequate volume   Culture   Final    NO GROWTH 5 DAYS Performed at [facility], [address], [place], [place] [address]    Report Status 12/27/2023 FINAL  Final     Radiology Studies: CT HIP RIGHT WO CONTRAST Result Date: 12/26/2023 CLINICAL DATA:  Septic arthritis suspected, right hip. Right hip pain. EXAM: CT OF THE RIGHT HIP WITHOUT CONTRAST TECHNIQUE: Multidetector CT imaging of the right hip was performed according to the standard protocol. Multiplanar CT image reconstructions were also generated. RADIATION DOSE REDUCTION: This exam was performed according to the departmental dose-optimization program which includes automated exposure control, adjustment of the mA and/or kV according to patient size and/or use of iterative reconstruction technique. COMPARISON:  Pelvis and right hip radiographs 12/24/2023, CT right hip and proximal femur 08/14/2005 FINDINGS: Bones/Joint/Cartilage Mild right femoroacetabular joint space narrowing. Chronic well corticated 5 x 5 x 8 mm (transverse by craniocaudal by AP) ossicle at the anterior superolateral right acetabulum. Minimal degenerative spurring at the primary posterior femoral head-neck junction (coronal series 5, images 45 through 50). Up to 13 mm lucent subchondral cystic changes within the anterior superior right femoral head-neck junction (coronal series 5, image 62 and axial series 2,  image 93). Mild pubic symphysis joint space narrowing and subchondral sclerosis. Mild visualized anterior right sacroiliac joint space narrowing,  subchondral sclerosis, and peripheral spurring. No right hip joint effusion is seen. No cortical erosion is seen to indicate osteomyelitis or septic arthritis. Ligaments Suboptimally assessed by CT. Muscles and Tendons There is enlargement of the right psoas muscle. The left psoas muscle is only partially visualized. In the right psoas muscle there is a heterogeneous, peripherally increased density and centrally both dependent increased density and nondependent low-density collection suggesting evolving hematoma with blood products of multiple ages and likely layering hematocrit level. In the appropriate clinical setting, this also could be an infected fluid collection. There is mild to moderate stranding within the mesenteric fat just anterior to the right psoas muscle. This abnormal heterogeneous density and swelling appears to involve at least the 16 cm craniocaudal dimension of the visualized psoas muscle extending into the right iliopsoas junction, although this also extends off the superior plane of view (axial series 4, images 1 through 101 of 160 and coronal series 8 images 58 through 85). Given the heterogeneity of this fluid within the right psoas muscle, it is difficult to exclude an infected component/abscess. The most distinct heterogeneous collection within the right psoas muscle measures up to approximately 4.5 cm in transverse dimension and 4.3 cm in AP dimension and is visualized measuring at least 7 cm in craniocaudal dimension, extending off the superior plane of view (coronal series 8, image 68). Inferior to this, the more inferior aspect of the right psoas muscle remains enlarged and mildly heterogeneous. Soft tissues Mild-to-moderate sigmoid diverticulosis. High-grade atherosclerotic calcifications. IMPRESSION: 1. There is enlargement of the right  psoas muscle. In the right psoas muscle there is a heterogeneous, peripherally increased density and centrally both dependent increased density and nondependent low-density collection possibly representing an evolving hematoma with blood products of multiple ages and a layering hematocrit level. In the appropriate clinical setting, this also could be an infected fluid collection/abscess. This collection appears to involve at least the 16 cm craniocaudal dimension of the visualized psoas muscle extending into the right iliopsoas junction, although this also extends off the superior plane of view. 2. No right hip joint effusion is seen. No cortical erosion is seen to indicate osteomyelitis or septic arthritis. 3. Mild right femoroacetabular osteoarthritis. Electronically Signed   By: Bertina Broccoli M.D.   On: 12/26/2023 10:08   VAS US  LOWER EXTREMITY VENOUS (DVT) Result Date: 12/25/2023  Lower Venous DVT Study Patient Name:  Jason Santana  Date of Exam:   12/25/2023 Medical Rec #: [id number]     Accession #:    [id number] Date of Birth: [birth year]    Patient Gender: M Patient Age:   49 years Exam Location:  [facility] Procedure:      VAS US  LOWER EXTREMITY VENOUS (DVT) Referring Phys: Deforest Fast --------------------------------------------------------------------------------  Indications: Pain, Swelling, and Edema.  Risk Factors: CHF. Comparison Study: 10/31/23 - Venous reflux study was positive for right leg                   venous insufficiency. Negative DVT. Performing Technologist: Franky Ivanoff Sturdivant-Jones RDMS, RVT  Examination Guidelines: A complete evaluation includes B-mode imaging, spectral Doppler, color Doppler, and power Doppler as needed of all accessible portions of each vessel. Bilateral testing is considered an integral part of a complete examination. Limited examinations for reoccurring indications may be performed as noted. The reflux portion of the exam is performed with the patient in  reverse Trendelenburg.  +---------+---------------+---------+-----------+----------+--------------+ RIGHT    CompressibilityPhasicitySpontaneityPropertiesThrombus Aging +---------+---------------+---------+-----------+----------+--------------+ CFV  Full           Yes      Yes                                 +---------+---------------+---------+-----------+----------+--------------+ SFJ      Full                                                        +---------+---------------+---------+-----------+----------+--------------+ FV Prox  Full                                                        +---------+---------------+---------+-----------+----------+--------------+ FV Mid   Full                                                        +---------+---------------+---------+-----------+----------+--------------+ FV DistalFull                                                        +---------+---------------+---------+-----------+----------+--------------+ PFV      Full                                                        +---------+---------------+---------+-----------+----------+--------------+ POP      Full           Yes      Yes                                 +---------+---------------+---------+-----------+----------+--------------+ PTV      Full                                                        +---------+---------------+---------+-----------+----------+--------------+ PERO     Full                                                        +---------+---------------+---------+-----------+----------+--------------+   +---------+---------------+---------+-----------+----------+--------------+ LEFT     CompressibilityPhasicitySpontaneityPropertiesThrombus Aging +---------+---------------+---------+-----------+----------+--------------+ CFV      Full           Yes      Yes                                  +---------+---------------+---------+-----------+----------+--------------+  SFJ      Full                                                        +---------+---------------+---------+-----------+----------+--------------+ FV Prox  Full                                                        +---------+---------------+---------+-----------+----------+--------------+ FV Mid   Full                                                        +---------+---------------+---------+-----------+----------+--------------+ FV DistalFull                                                        +---------+---------------+---------+-----------+----------+--------------+ PFV      Full                                                        +---------+---------------+---------+-----------+----------+--------------+ POP      Full           Yes      Yes                                 +---------+---------------+---------+-----------+----------+--------------+ PTV      Full                                                        +---------+---------------+---------+-----------+----------+--------------+ PERO     Full                                                        +---------+---------------+---------+-----------+----------+--------------+     Summary: BILATERAL: - No evidence of deep vein thrombosis seen in the lower extremities, bilaterally. -No evidence of popliteal cyst, bilaterally.   *See table(s) above for measurements and observations. Electronically signed by Genny Kid MD on 12/25/2023 at 4:39:18 PM.    Final      Scheduled Meds:  dextromethorphan -guaiFENesin   2 tablet Oral BID   diltiazem   120 mg Oral Daily   feeding supplement  237 mL Oral BID BM   furosemide   40 mg Oral Daily   levothyroxine   125 mcg Oral QAC breakfast   lidocaine   1 patch Transdermal Q24H   pantoprazole   40 mg Oral  BID   senna-docusate  1 tablet Oral BID   Continuous Infusions:    ceFAZolin  (ANCEF ) IV 200 mL/hr at 12/27/23 0621      LOS: 10 days    Time spent:    Deforest Fast, MD [facility]   12/27/2023, 11:56 AM

## 2023-12-27 NOTE — Progress Notes (Signed)
 Critical lab value PT/INR  49/5.3 Pt continues to c/o 10/10 Right hip pain, medicated as ordered. Dr. Amy Kansky paged, waiting return call.

## 2023-12-27 NOTE — Progress Notes (Signed)
 Occupational Therapy Treatment Patient Details Name: Jason Santana MRN: [id number] DOB: 1932/01/17 Today's Date: 12/27/2023   History of present illness Pt is an 89 y/o M presenting to ED on 4/15 wtih BLE edema x1 week and worsening dyspnea, admitted for acute GI bleed. CXR with LLL PNA/bacteremia.  4/18 EGD and upper GI endoscopy. 4/23 R hip pain, plain films negative. PMH includes A fib, A flutter, AVR, PVCs, CKD IIIA, HTN, cryptogenic cirrhosis, GERD, hypothryoidism   OT comments  Pt received supine and agreeable to session with encouragement. Pt declined all mobility d/t R hip/groin pain and stated that he didn't think he would be able to move. Session performed from bed level. Pt able to complete grooming with set up to wash face. Pt declined other self care and breakfast however requested saltines which were eaten with set up A. Pt performed AROM UE strengthening exercises with yellow TB. Left in bed with all needs met. D/C recommendations may change from [facility] to SNF if pt continues to decline in function. Acute OT to continue to follow to address established goals to facilitate DC to next venue of care.       If plan is discharge home, recommend the following:  A little help with walking and/or transfers;A little help with bathing/dressing/bathroom;Assistance with cooking/housework;Assist for transportation;Help with stairs or ramp for entrance   Equipment Recommendations  Tub/shower seat;Other (comment)    Recommendations for Other Services      Precautions / Restrictions Precautions Precautions: Fall Recall of Precautions/Restrictions: Intact Restrictions Weight Bearing Restrictions Per Provider Order: No       Mobility Bed Mobility               General bed mobility comments: declined mobility d/t pain    Transfers                   General transfer comment: unable due to R hip pain     Balance                                            ADL either performed or assessed with clinical judgement   ADL Overall ADL's : Needs assistance/impaired Eating/Feeding: Set up;Bed level Eating/Feeding Details (indicate cue type and reason): pt declining to eat breakfast d/t nausea, requested saltines and ate at bed level with set up A Grooming: Wash/dry face;Bed level Grooming Details (indicate cue type and reason): washed face at bed level d/t pain                               General ADL Comments: pt declining to get up d/t pain, declined ADLs and completed what they could tolerate from bed level    Extremity/Trunk Assessment              Vision       Perception     Praxis     Communication Communication Communication: No apparent difficulties   Cognition Arousal: Alert Behavior During Therapy: WFL for tasks assessed/performed Cognition: No apparent impairments                               Following commands: Intact        Cueing   Cueing Techniques: Verbal cues  Exercises Exercises:  General Upper Extremity General Exercises - Upper Extremity Shoulder Flexion: AROM, Strengthening, Both, 10 reps, Supine, Theraband Theraband Level (Shoulder Flexion): Level 1 (Yellow) Shoulder ADduction: AROM, Strengthening, Both, 10 reps, Supine, Theraband Theraband Level (Shoulder Adduction): Level 1 (Yellow) Shoulder Horizontal ABduction: AROM, Strengthening, 10 reps, Supine, Theraband, Both Theraband Level (Shoulder Horizontal Abduction): Level 1 (Yellow) Shoulder Horizontal ADduction: AROM, 10 reps, Strengthening, Supine, Both, Theraband Theraband Level (Shoulder Horizontal Adduction): Level 1 (Yellow)    Shoulder Instructions       General Comments SpO2 high 80s on 3L, pt in a lot of pain and declined any mobility    Pertinent Vitals/ Pain       Pain Assessment Pain Assessment: Faces Faces Pain Scale: Hurts whole lot Pain Location: R hip and LE with movement Pain Descriptors /  Indicators: Discomfort, Grimacing, Sore, Tender Pain Intervention(s): Monitored during session, Limited activity within patient's tolerance  Home Living                                          Prior Functioning/Environment              Frequency  Min 2X/week        Progress Toward Goals  OT Goals(current goals can now be found in the care plan section)  Progress towards OT goals: Progressing toward goals  Acute Rehab OT Goals Patient Stated Goal: none stated OT Goal Formulation: With patient Time For Goal Achievement: 01/04/24 Potential to Achieve Goals: Good ADL Goals Pt Will Perform Upper Body Dressing: with modified independence;sitting Pt Will Perform Lower Body Dressing: with modified independence;sitting/lateral leans;sit to/from stand Pt Will Transfer to Toilet: with modified independence;ambulating;regular height toilet Pt Will Perform Tub/Shower Transfer: Shower transfer;with modified independence;ambulating Additional ADL Goal #1: pt will tolerate OOB activity x10 min wtih SpO2 above 90% in prep for ADLs  Plan      Co-evaluation                 AM-PAC OT "6 Clicks" Daily Activity     Outcome Measure   Help from another person eating meals?: None Help from another person taking care of personal grooming?: A Little Help from another person toileting, which includes using toliet, bedpan, or urinal?: A Little Help from another person bathing (including washing, rinsing, drying)?: A Little Help from another person to put on and taking off regular upper body clothing?: A Little Help from another person to put on and taking off regular lower body clothing?: A Little 6 Click Score: 19    End of Session    OT Visit Diagnosis: Unsteadiness on feet (R26.81);Other abnormalities of gait and mobility (R26.89);Muscle weakness (generalized) (M62.81)   Activity Tolerance Patient limited by pain   Patient Left in bed;with call bell/phone  within reach;with bed alarm set   Nurse Communication Mobility status        Time: 5784-6962 OT Time Calculation (min): 21 min  Charges: OT General Charges $OT Visit: 1 Visit OT Treatments $Therapeutic Exercise: 8-22 mins  Chondra Boyde, BS, OTA/S   Asjia Berrios 12/27/2023, 2:01 PM

## 2023-12-27 NOTE — Progress Notes (Addendum)
 [facility]    Date of Admission:  12/17/2023   Total days of antibiotics 8           ID: Jason Santana is a 89 y.o. male with  gib found to have transient strep bacteremia Principal Problem:   Acute on chronic diastolic CHF (congestive heart failure) (HCC) Active Problems:   Paroxysmal atrial flutter (HCC)   Essential hypertension   CAD (coronary artery disease) of artery bypass graft   Hypothyroidism   Acute upper GI bleed   Stage 3a chronic kidney disease (HCC)   Cirrhosis of liver without ascites (HCC)   Left lower lobe pneumonia    Subjective: Afebrile. Still right hip pain. IR evaluated and felt he has signs of right psoas hematoma. Still too high risk for aspiration.   Patient about to work with PT.  Spoke with granddaughter about plans for treatment  TTE negative Blood cx on 4/18 1 of 4 bottles with strep mitis, then repeat on 4/19 -   Medications:   dextromethorphan -guaiFENesin   2 tablet Oral BID   diltiazem   120 mg Oral Daily   feeding supplement  237 mL Oral BID BM   furosemide   40 mg Oral Daily   levothyroxine   125 mcg Oral QAC breakfast   lidocaine   1 patch Transdermal Q24H   pantoprazole   40 mg Oral BID   senna-docusate  1 tablet Oral BID    Objective: Vital signs in last 24 hours: Temp:  [97.6 F (36.4 C)-98.9 F (37.2 C)] 98.1 F (36.7 C) (04/25 0757) Pulse Rate:  [86-94] 94 (04/25 0757) Resp:  [15-18] 17 (04/25 0757) BP: (117-140)/(49-95) 129/60 (04/25 0757) SpO2:  [90 %-97 %] 90 % (04/25 0757) Weight:  [77.9 kg] 77.9 kg (04/25 0400)  Physical Exam  Constitutional: He is oriented to person, place, and time. He appears well-developed and well-nourished. No distress.  HENT:  Mouth/Throat: Oropharynx is clear and moist. No oropharyngeal exudate.  Cardiovascular: Normal rate, regular rhythm and normal heart sounds. Exam reveals no gallop and no friction rub. +murmur Pulmonary/Chest: Effort normal and breath sounds  normal. No respiratory distress. He has no wheezes.  Abdominal: Soft. Bowel sounds are normal. He exhibits no distension. There is no tenderness.  Lymphadenopathy:  He has no cervical adenopathy.  Neurological: He is alert and oriented to person, place, and time.  Skin: Skin is warm and dry. No rash noted. No erythema.  Psychiatric: He has a normal mood and affect. His behavior is normal.    Lab Results Recent Labs    12/25/23 0233 12/26/23 0311 12/27/23 0242  WBC 9.1 12.7* 13.2*  HGB 9.2* 8.7* 8.3*  HCT 29.6* 28.6* 26.7*  NA 138 138  --   K 4.9 5.1  --   CL 106 107  --   CO2 21* 23  --   BUN 27* 34*  --   CREATININE 1.71* 1.69*  --    Liver Panel Recent Labs    12/26/23 0311  PROT 6.2*  ALBUMIN 2.5*  AST 90*  ALT 24  ALKPHOS 86  BILITOT 0.8   Sedimentation Rate Recent Labs    12/25/23 1502  ESRSEDRATE 27*   C-Reactive Protein Recent Labs    12/25/23 1502  CRP 4.8*    Microbiology: reviewed Studies/Results: CT HIP RIGHT WO CONTRAST Result Date: 12/26/2023 CLINICAL DATA:  Septic arthritis suspected, right hip. Right hip pain. EXAM: CT OF THE RIGHT HIP WITHOUT CONTRAST TECHNIQUE: Multidetector CT imaging  of the right hip was performed according to the standard protocol. Multiplanar CT image reconstructions were also generated. RADIATION DOSE REDUCTION: This exam was performed according to the departmental dose-optimization program which includes automated exposure control, adjustment of the mA and/or kV according to patient size and/or use of iterative reconstruction technique. COMPARISON:  Pelvis and right hip radiographs 12/24/2023, CT right hip and proximal femur 08/14/2005 FINDINGS: Bones/Joint/Cartilage Mild right femoroacetabular joint space narrowing. Chronic well corticated 5 x 5 x 8 mm (transverse by craniocaudal by AP) ossicle at the anterior superolateral right acetabulum. Minimal degenerative spurring at the primary posterior femoral head-neck junction  (coronal series 5, images 45 through 50). Up to 13 mm lucent subchondral cystic changes within the anterior superior right femoral head-neck junction (coronal series 5, image 62 and axial series 2, image 93). Mild pubic symphysis joint space narrowing and subchondral sclerosis. Mild visualized anterior right sacroiliac joint space narrowing, subchondral sclerosis, and peripheral spurring. No right hip joint effusion is seen. No cortical erosion is seen to indicate osteomyelitis or septic arthritis. Ligaments Suboptimally assessed by CT. Muscles and Tendons There is enlargement of the right psoas muscle. The left psoas muscle is only partially visualized. In the right psoas muscle there is a heterogeneous, peripherally increased density and centrally both dependent increased density and nondependent low-density collection suggesting evolving hematoma with blood products of multiple ages and likely layering hematocrit level. In the appropriate clinical setting, this also could be an infected fluid collection. There is mild to moderate stranding within the mesenteric fat just anterior to the right psoas muscle. This abnormal heterogeneous density and swelling appears to involve at least the 16 cm craniocaudal dimension of the visualized psoas muscle extending into the right iliopsoas junction, although this also extends off the superior plane of view (axial series 4, images 1 through 101 of 160 and coronal series 8 images 58 through 85). Given the heterogeneity of this fluid within the right psoas muscle, it is difficult to exclude an infected component/abscess. The most distinct heterogeneous collection within the right psoas muscle measures up to approximately 4.5 cm in transverse dimension and 4.3 cm in AP dimension and is visualized measuring at least 7 cm in craniocaudal dimension, extending off the superior plane of view (coronal series 8, image 68). Inferior to this, the more inferior aspect of the right psoas  muscle remains enlarged and mildly heterogeneous. Soft tissues Mild-to-moderate sigmoid diverticulosis. High-grade atherosclerotic calcifications. IMPRESSION: 1. There is enlargement of the right psoas muscle. In the right psoas muscle there is a heterogeneous, peripherally increased density and centrally both dependent increased density and nondependent low-density collection possibly representing an evolving hematoma with blood products of multiple ages and a layering hematocrit level. In the appropriate clinical setting, this also could be an infected fluid collection/abscess. This collection appears to involve at least the 16 cm craniocaudal dimension of the visualized psoas muscle extending into the right iliopsoas junction, although this also extends off the superior plane of view. 2. No right hip joint effusion is seen. No cortical erosion is seen to indicate osteomyelitis or septic arthritis. 3. Mild right femoroacetabular osteoarthritis. Electronically Signed   By: Bertina Broccoli M.D.   On: 12/26/2023 10:08   VAS US  LOWER EXTREMITY VENOUS (DVT) Result Date: 12/25/2023  Lower Venous DVT Study Patient Name:  Jason Santana  Date of Exam:   12/25/2023 Medical Rec #: [id number]     Accession #:    [id number] Date of Birth: Dec 13, 1931  Patient Gender: M Patient Age:   23 years Exam Location:  [facility] Procedure:      VAS US  LOWER EXTREMITY VENOUS (DVT) Referring Phys: Deforest Fast --------------------------------------------------------------------------------  Indications: Pain, Swelling, and Edema.  Risk Factors: CHF. Comparison Study: 10/31/23 - Venous reflux study was positive for right leg                   venous insufficiency. Negative DVT. Performing Technologist: Franky Ivanoff Sturdivant-Jones RDMS, RVT  Examination Guidelines: A complete evaluation includes B-mode imaging, spectral Doppler, color Doppler, and power Doppler as needed of all accessible portions of each vessel. Bilateral testing is  considered an integral part of a complete examination. Limited examinations for reoccurring indications may be performed as noted. The reflux portion of the exam is performed with the patient in reverse Trendelenburg.  +---------+---------------+---------+-----------+----------+--------------+ RIGHT    CompressibilityPhasicitySpontaneityPropertiesThrombus Aging +---------+---------------+---------+-----------+----------+--------------+ CFV      Full           Yes      Yes                                 +---------+---------------+---------+-----------+----------+--------------+ SFJ      Full                                                        +---------+---------------+---------+-----------+----------+--------------+ FV Prox  Full                                                        +---------+---------------+---------+-----------+----------+--------------+ FV Mid   Full                                                        +---------+---------------+---------+-----------+----------+--------------+ FV DistalFull                                                        +---------+---------------+---------+-----------+----------+--------------+ PFV      Full                                                        +---------+---------------+---------+-----------+----------+--------------+ POP      Full           Yes      Yes                                 +---------+---------------+---------+-----------+----------+--------------+ PTV      Full                                                        +---------+---------------+---------+-----------+----------+--------------+  PERO     Full                                                        +---------+---------------+---------+-----------+----------+--------------+   +---------+---------------+---------+-----------+----------+--------------+ LEFT      CompressibilityPhasicitySpontaneityPropertiesThrombus Aging +---------+---------------+---------+-----------+----------+--------------+ CFV      Full           Yes      Yes                                 +---------+---------------+---------+-----------+----------+--------------+ SFJ      Full                                                        +---------+---------------+---------+-----------+----------+--------------+ FV Prox  Full                                                        +---------+---------------+---------+-----------+----------+--------------+ FV Mid   Full                                                        +---------+---------------+---------+-----------+----------+--------------+ FV DistalFull                                                        +---------+---------------+---------+-----------+----------+--------------+ PFV      Full                                                        +---------+---------------+---------+-----------+----------+--------------+ POP      Full           Yes      Yes                                 +---------+---------------+---------+-----------+----------+--------------+ PTV      Full                                                        +---------+---------------+---------+-----------+----------+--------------+ PERO     Full                                                        +---------+---------------+---------+-----------+----------+--------------+  Summary: BILATERAL: - No evidence of deep vein thrombosis seen in the lower extremities, bilaterally. -No evidence of popliteal cyst, bilaterally.   *See table(s) above for measurements and observations. Electronically signed by Genny Kid MD on 12/25/2023 at 4:39:18 PM.    Final      Assessment/Plan: Strep bacteremia = despite presence of prosthetic valve - TTE negative, low level bacteremia in the setting of gi bleed. Plan  to continue on IV cefazolin  while hospitalized then finish course of treatment with amoxicillin  1gm BID for remaining course to complete total of 4 wk including Iv therapy days  Right hip/back pain associated with right psoas muscle hematoma, unable to exclude infection -   Anemia = slightly lower today. Will recommend to check cbc tomorrow to see that he is stable.  We will see back in the ID clinic in 3-4 wk.  Will sign off  I have personally spent 50 minutes involved in face-to-face and non-face-to-face activities for this patient on the day of the visit. Professional time spent includes the following activities: Preparing to see the patient (review of tests), Obtaining and/or reviewing separately obtained history (admission/discharge record), Performing a medically appropriate examination and/or evaluation , Ordering medications/tests/procedures, referring and communicating with other health care professionals, Documenting clinical information in the EMR, Independently interpreting results (not separately reported), Communicating results to the patient/family/caregiver, Counseling and educating the patient/family/caregiver and Care coordination (not separately reported).    [facility] Pager: [phone]  12/27/2023, 10:56 AM

## 2023-12-27 NOTE — Plan of Care (Signed)
     Referral received for [name]: goals of care discussion. Chart reviewed and updates received from Dr. Drexel Gentles. Patient sleeping. Attempted to contact patient's wife Geza Beranek twice. Unable to reach. Voicemail not set up.   PMT will re-attempt to contact family tomorrow. Detailed note and recommendations to follow once GOC has been completed.   Thank you for your referral and allowing PMT to assist in Jason Santana's care.   Joaquim Muir, NP Palliative Medicine Team  Team Phone # [phone]   NO CHARGE

## 2023-12-27 NOTE — Plan of Care (Signed)
  Problem: Clinical Measurements: Goal: Cardiovascular complication will be avoided Outcome: Progressing   Problem: Elimination: Goal: Will not experience complications related to urinary retention Outcome: Progressing   Problem: Pain Managment: Goal: General experience of comfort will improve and/or be controlled Outcome: Progressing   Problem: Activity: Goal: Capacity to carry out activities will improve Outcome: Progressing

## 2023-12-27 NOTE — Significant Event (Signed)
 Significant event note:   Notified of critical INR 5.3. Hx Mech AVR + Afib. INR goal reduced to 2-3; previously was 2.5-3.5. Hx recent GI bleeding requiring intervention. Hx of psoas hematoma identified 4/23 with hematocrit level indicating active bleeding. Per review, family unsure if they would want to pursue intervention. Warfarin has been held, has not received any reversal yet. Spoke with pharmacy about his case. Considering INR uptrend and hx recent active psoas bleed, will start reversal with Vitamin K 2.5 mg IV x 1, and trend INR. Will need heparin  gtt if INR falls <2. If he has signs of worsening hemodynamics may need repeat CTA to eval for active bleeding and reevaluate for IR intervention, as well as more aggressive lowering with additional vitamin K IV / PCC.   Arnulfo Larch, MD  [facility]

## 2023-12-28 DIAGNOSIS — Z515 Encounter for palliative care: Secondary | ICD-10-CM

## 2023-12-28 DIAGNOSIS — Z7189 Other specified counseling: Secondary | ICD-10-CM

## 2023-12-28 DIAGNOSIS — I5033 Acute on chronic diastolic (congestive) heart failure: Secondary | ICD-10-CM | POA: Diagnosis not present

## 2023-12-28 LAB — CBC
HCT: 26.5 % — ABNORMAL LOW (ref 39.0–52.0)
Hemoglobin: 8.1 g/dL — ABNORMAL LOW (ref 13.0–17.0)
MCH: 32.4 pg (ref 26.0–34.0)
MCHC: 30.6 g/dL (ref 30.0–36.0)
MCV: 106 fL — ABNORMAL HIGH (ref 80.0–100.0)
Platelets: 219 10*3/uL (ref 150–400)
RBC: 2.5 MIL/uL — ABNORMAL LOW (ref 4.22–5.81)
RDW: 15.9 % — ABNORMAL HIGH (ref 11.5–15.5)
WBC: 12.7 10*3/uL — ABNORMAL HIGH (ref 4.0–10.5)
nRBC: 0.2 % (ref 0.0–0.2)

## 2023-12-28 LAB — COMPREHENSIVE METABOLIC PANEL WITH GFR
ALT: 16 U/L (ref 0–44)
AST: 103 U/L — ABNORMAL HIGH (ref 15–41)
Albumin: 2.1 g/dL — ABNORMAL LOW (ref 3.5–5.0)
Alkaline Phosphatase: 77 U/L (ref 38–126)
Anion gap: 7 (ref 5–15)
BUN: 36 mg/dL — ABNORMAL HIGH (ref 8–23)
CO2: 25 mmol/L (ref 22–32)
Calcium: 8.2 mg/dL — ABNORMAL LOW (ref 8.9–10.3)
Chloride: 107 mmol/L (ref 98–111)
Creatinine, Ser: 1.53 mg/dL — ABNORMAL HIGH (ref 0.61–1.24)
GFR, Estimated: 43 mL/min — ABNORMAL LOW (ref >60)
Glucose, Bld: 120 mg/dL — ABNORMAL HIGH (ref 70–99)
Potassium: 4.3 mmol/L (ref 3.5–5.1)
Sodium: 139 mmol/L (ref 135–145)
Total Bilirubin: 1.4 mg/dL — ABNORMAL HIGH (ref 0.0–1.2)
Total Protein: 5.9 g/dL — ABNORMAL LOW (ref 6.5–8.1)

## 2023-12-28 LAB — PROTIME-INR
INR: 1.8 — ABNORMAL HIGH (ref 0.8–1.2)
Prothrombin Time: 20.7 s — ABNORMAL HIGH (ref 11.4–15.2)

## 2023-12-28 MED ORDER — HEPARIN (PORCINE) 25000 UT/250ML-% IV SOLN
750.0000 [IU]/h | INTRAVENOUS | Status: DC
  Administered 2023-12-28: 750 [IU]/h via INTRAVENOUS
  Filled 2023-12-28: qty 250

## 2023-12-28 MED ORDER — HYDROMORPHONE HCL 1 MG/ML IJ SOLN
0.5000 mg | INTRAMUSCULAR | Status: DC | PRN
  Administered 2023-12-28: 1 mg via INTRAVENOUS
  Filled 2023-12-28: qty 1

## 2023-12-28 MED ORDER — SODIUM CHLORIDE 0.9 % IV SOLN: 150.0000 mg | INTRAVENOUS | Status: DC

## 2023-12-28 MED ORDER — HYDROMORPHONE HCL 1 MG/ML IJ SOLN: 0.5000 mg | INTRAMUSCULAR | Status: DC | PRN

## 2023-12-28 MED ORDER — HALOPERIDOL LACTATE 5 MG/ML IJ SOLN: 2.0000 mg | INTRAMUSCULAR | Status: DC | PRN

## 2023-12-28 MED ORDER — ONDANSETRON HCL 4 MG/2ML IJ SOLN: 4.0000 mg | Freq: Four times a day (QID) | INTRAMUSCULAR | Status: DC | PRN

## 2023-12-28 MED ORDER — LORAZEPAM 2 MG/ML IJ SOLN: 1.0000 mg | INTRAMUSCULAR | Status: DC | PRN

## 2023-12-28 MED ORDER — DIPHENHYDRAMINE HCL 50 MG/ML IJ SOLN: 25.0000 mg | INTRAMUSCULAR | Status: DC | PRN

## 2023-12-28 MED ORDER — GLYCOPYRROLATE 0.2 MG/ML IJ SOLN: 0.2000 mg | INTRAMUSCULAR | Status: DC | PRN

## 2023-12-28 MED ORDER — POLYVINYL ALCOHOL 1.4 % OP SOLN: 1.0000 [drp] | Freq: Four times a day (QID) | OPHTHALMIC | Status: DC | PRN

## 2023-12-28 MED ORDER — HALOPERIDOL LACTATE 2 MG/ML PO CONC: 2.0000 mg | Freq: Four times a day (QID) | ORAL | Status: DC | PRN

## 2023-12-28 MED ORDER — ONDANSETRON 4 MG PO TBDP
4.0000 mg | ORAL_TABLET | Freq: Four times a day (QID) | ORAL | Status: DC | PRN
Start: 2023-12-28 — End: 2023-12-28

## 2023-12-28 MED ORDER — BIOTENE DRY MOUTH MT LIQD
15.0000 mL | Freq: Two times a day (BID) | OROMUCOSAL | Status: DC
  Administered 2023-12-28: 15 mL via TOPICAL

## 2023-12-28 MED ORDER — LORAZEPAM 1 MG PO TABS: 1.0000 mg | ORAL_TABLET | ORAL | Status: DC | PRN

## 2023-12-28 MED ORDER — LORAZEPAM 2 MG/ML PO CONC: 1.0000 mg | ORAL | Status: DC | PRN

## 2023-12-28 MED ORDER — HALOPERIDOL 1 MG PO TABS: 2.0000 mg | ORAL_TABLET | Freq: Four times a day (QID) | ORAL | Status: DC | PRN

## 2023-12-28 MED ORDER — GLYCOPYRROLATE 1 MG PO TABS: 1.0000 mg | ORAL_TABLET | ORAL | Status: DC | PRN

## 2023-12-28 NOTE — Progress Notes (Signed)
 RN called report to Shelagh [name] at [facility]. RN to leave pivs in for transfer to [facility].

## 2023-12-28 NOTE — Progress Notes (Signed)
 PHARMACY - ANTICOAGULATION CONSULT NOTE  Pharmacy Consult for Warfarin - on  hold; heparin  when INR < 2 Indication: atrial fibrillation and mAVR  Allergies  Allergen Reactions   Carvedilol  Shortness Of Breath    wheezing   Lopid [Gemfibrozil] Other (See Comments)    transaminitis   Monopril [Fosinopril] Other (See Comments)    transaminitis   Vicodin [Hydrocodone-Acetaminophen ]     Severe sensitivity   Phenergan [Promethazine Hcl] Other (See Comments)    Severe somnolence.   Zocor [Simvastatin]     Short memory loss.   Benicar [Olmesartan]     Abdominal cramping and increased stools/ irritable bowel   Codeine Nausea And Vomiting   Prilosec [Omeprazole]     dyspepsia    Patient Measurements: Height: 5\' 10"  (177.8 cm) Weight: 77.9 kg (171 lb 11.8 oz) IBW/kg (Calculated) : 73 HEPARIN  DW (KG): 82.3  Vital Signs: Temp: 98.3 F (36.8 C) (04/26 0324) Temp Source: Oral (04/26 0324) BP: 126/48 (04/26 0324) Pulse Rate: 81 (04/26 0324)  Labs: Recent Labs    12/26/23 0311 12/27/23 0242 12/27/23 1457 12/28/23 0235  HGB 8.7* 8.3* 8.1* 8.1*  HCT 28.6* 26.7* 26.9* 26.5*  PLT 236 235 230 219  LABPROT 38.5* 49.0* 27.7* 20.7*  INR 3.9* 5.3* 2.6* 1.8*  CREATININE 1.69*  --   --  1.53*    Estimated Creatinine Clearance: 33.8 mL/min (A) (by C-G formula based on SCr of 1.53 mg/dL (H)).  Assessment: Jason Santana a 89 y.o. male presented with GIB, now s/p EGD 4/18. In EGD op note, GI states it is ok to resume warfarin on 4/19, confirmed OK to restart with TRH. OF note, patient is currently on azithromycin  for possible pneumonia. Given this DDI, which would likely lead to increased INR, recent GIB with diminished PO intake, and INR on last check 2.5 (4/18), will initiate warfarin at lower than PTA dosing and monitor closely.   Anticoagulation PTA: Warfarin PTA dosing: 5 mg M/F, 7.5 mg all other days  Notable DDI's - azithromycin  500 mg 4/18>19 (increase warfarin  concentrations).  INR with quick jump to 3.9.  No overt bleeding or complications noted, CBC stable.  Pharmacy asked to hold Coumadin  and start IV heparin  once INR down <2.  Goal of Therapy:  INR 2-3 Heparin  level 0.3-0.7 units/ml Monitor platelets by anticoagulation protocol: Yes   Plan:  No warfarin Start IV UFH 1000 units/hour  Continue to monitor H&H and platelets  Thank you for allowing pharmacy to be a part of this patient's care.  Albino Alu, PharmD PGY2 Cardiology Pharmacy Resident  12/28/2023 6:07 AM   [facility] pharmacy phone numbers are listed on [email or web address]

## 2023-12-28 NOTE — Discharge Summary (Addendum)
 Physician Discharge Summary  Jason Santana JXB:[id number] DOB: [birth year] DOA: 12/17/2023  PCP: Laneta Pintos, MD  Admit date: 12/17/2023 Discharge date: 12/28/2023  Time spent: 45 minutes  Recommendations for Outpatient Follow-up:  [facility] for comfort focused care   Discharge Diagnoses:  Principal Problem:   Acute on chronic diastolic CHF (congestive heart failure) (HCC)   Large R psoas hematoma   GAVE's disease   Chronic kidney disease, stage 3b   Acute upper GI bleed   Paroxysmal atrial flutter (HCC)   CAD (coronary artery disease) of artery bypass graft   Cirrhosis of liver without ascites (HCC)   Essential hypertension   Stage 3a chronic kidney disease (HCC)   Hypothyroidism   Failure to thrive   Filed Weights   12/26/23 0421 12/27/23 0400 12/28/23 0500  Weight: 77 kg 77.9 kg 81 kg    History of present illness:  89/M w CAD, A-fib/flutter and mechanical aortic valve replacement on Coumadin , CHF, CKD stage IIIa, cryptogenic cirrhosis, GERD, hypothyroidism presenting with complaints of shortness of breath and bilateral lower extremity edema.  Reports chronically dark stools. In ED, VSS, labs w/ Hb 6.8, creatinine 1.4 , albumin 2.5,  troponin 255> 251, BNP 252, INR 3.7.  FOBT positive , CXR wcardiomegaly and small left pleural effusion.   Gastroenterology also consulted -Dr. Elvin Hammer with [facility]. Patient was given IV Lasix  40 mg and IV Protonix  80 mg in the ED. 1 unit PRBCs ordered. - 4/22-23 developed right hip pain, CT on 4/24 noted large right psoas fluid collection likely hematoma unable to exclude infection, IR consulted, warfarin held - 4/24, IR felt imaging most consistent with intramuscular hematoma with supratherapeutic INR and did not recommend aspiration  Hospital Course:  New large right psoas hematoma - Cannot rule out infection - Noted on CT, IR consulted for CT-guided aspiration, they felt this is likely intramuscular hematoma and suggested against  aspiration, in the setting of coagulopathy - INR supratherapeutic 4/25, vitamin K given, warfarin held - INR down to 1.8, started on IV heparin , - Appreciate infectious disease input, recommended to continue IV Ancef  while hospitalized - Discussed with ID and spouse, overall prognosis is poor,  palliative care consult requested, transitioned to comfort care, plan [facility]    Acute on chronic diastolic CHF  -Echo with EF 60 to 65%, RV  low normal, RVSP 53,7 mmHg. LA with moderate dilation, moderate tricuspid regurgitation, St Jude mechanical valve at the aortic position, -Cards following, diuresed with IV Lasix , down 10 LB, now switched to oral Lasix  - now comfort care   Mechanical aortic valve -holding warfarin, starting IV heparin  today   Strep mitis bacteremia -Presumed to be related to recent dental extraction 4 weeks ago -TTE negative -Concern regarding TEE in the setting of recent GI bleed, anticoagulation and anemia, infectious disease following treated with IV ceftriaxone  initially -Repeat blood cultures are negative - Was transitioned to oral amoxicillin , however with large right psoas fluid collection which is likely hematoma but unable to exclude infection now back on IV Ancef , now comfort care   Acute upper GI bleed/acute blood loss anemia -04/18 EGD, with 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  - Status post IV iron    Paroxysmal atrial flutter (HCC) - Continue Cardizem , warfarin held   CAD (coronary artery disease) of artery bypass graft -No chest pain, no acute coronary syndrome.  - 2D echo demonstrated no wall motion abnormalities.   Cirrhosis of liver without  ascites (HCC) - Diuretics as above   Acute kidney injury on stage 3a chronic kidney disease (HCC) -Stable and at baseline   Hypothyroidism -Continue Synthroid .    Discharge Exam: Vitals:   12/28/23 0704 12/28/23 1132  BP: (!) 119/46 (!)  115/46  Pulse:    Resp:    Temp: 98.6 F (37 C) 98.3 F (36.8 C)  SpO2:     Gen: Elderly chronically ill male sitting up in bed, AAO x 3 HEENT: No JVD CVS: S1-S2, rate rhythm, metallic click Lungs: Poor air movement bilaterally Abdomen: Soft, nontender, bowel sounds present  Extremities: No edema, right groin with mildly painful range of motion Skin: no new rashes on exposed skin   Discharge Instructions    Allergies as of 12/28/2023       Reactions   Carvedilol  Shortness Of Breath   wheezing   Lopid [gemfibrozil] Other (See Comments)   transaminitis   Monopril [fosinopril] Other (See Comments)   transaminitis   Vicodin [hydrocodone-acetaminophen ]    Severe sensitivity   Phenergan [promethazine Hcl] Other (See Comments)   Severe somnolence.   Zocor [simvastatin]    Short memory loss.   Benicar [olmesartan]    Abdominal cramping and increased stools/ irritable bowel   Codeine Nausea And Vomiting   Prilosec [omeprazole]    dyspepsia        Medication List     STOP taking these medications    diltiazem  120 MG 24 hr capsule Commonly known as: Tiadylt  ER   diphenhydramine -acetaminophen  25-500 MG Tabs tablet Commonly known as: TYLENOL  PM   ferrous sulfate  325 (65 FE) MG tablet   furosemide  20 MG tablet Commonly known as: LASIX    furosemide  40 MG tablet Commonly known as: LASIX    levothyroxine  125 MCG tablet Commonly known as: SYNTHROID    pantoprazole  40 MG tablet Commonly known as: PROTONIX    SYSTANE OP   tobramycin  0.3 % ophthalmic solution Commonly known as: TOBREX    valsartan  320 MG tablet Commonly known as: DIOVAN    Vitamin D  50 MCG (2000 UT) tablet   warfarin 5 MG tablet Commonly known as: COUMADIN        TAKE these medications    HYDROmorphone  1 MG/ML injection Commonly known as: DILAUDID  Inject 0.5-2 mLs (0.5-2 mg total) into the vein every 30 (thirty) minutes as needed for severe pain (pain score 7-10) or moderate pain (pain  score 4-6) (To alleviate signs and symptoms of distress).   LORazepam 1 MG tablet Commonly known as: ATIVAN Take 1 tablet (1 mg total) by mouth every 4 (four) hours as needed for anxiety, seizure or sleep.               Durable Medical Equipment  (From admission, onward)           Start     Ordered   12/23/23 1503  For home use only DME Walker rolling  Once       Question Answer Comment  Walker: With 5 Inch Wheels   Patient needs a walker to treat with the following condition Weakness      12/23/23 1503           Allergies  Allergen Reactions   Carvedilol  Shortness Of Breath    wheezing   Lopid [Gemfibrozil] Other (See Comments)    transaminitis   Monopril [Fosinopril] Other (See Comments)    transaminitis   Vicodin [Hydrocodone-Acetaminophen ]     Severe sensitivity   Phenergan [Promethazine Hcl] Other (See Comments)  Severe somnolence.   Zocor [Simvastatin]     Short memory loss.   Benicar [Olmesartan]     Abdominal cramping and increased stools/ irritable bowel   Codeine Nausea And Vomiting   Prilosec [Omeprazole]     dyspepsia    Follow-up Information     [facility] Follow up.   Why: Home health has been arranged. They will contact you to schedule apt within 48hrs post discharge. Contact information: [address] [place] [address] [phone]                  The results of significant diagnostics from this hospitalization (including imaging, microbiology, ancillary and laboratory) are listed below for reference.    Significant Diagnostic Studies: CT HIP RIGHT WO CONTRAST Result Date: 12/26/2023 CLINICAL DATA:  Septic arthritis suspected, right hip. Right hip pain. EXAM: CT OF THE RIGHT HIP WITHOUT CONTRAST TECHNIQUE: Multidetector CT imaging of the right hip was performed according to the standard protocol. Multiplanar CT image reconstructions were also generated. RADIATION DOSE REDUCTION: This exam  was performed according to the departmental dose-optimization program which includes automated exposure control, adjustment of the mA and/or kV according to patient size and/or use of iterative reconstruction technique. COMPARISON:  Pelvis and right hip radiographs 12/24/2023, CT right hip and proximal femur 08/14/2005 FINDINGS: Bones/Joint/Cartilage Mild right femoroacetabular joint space narrowing. Chronic well corticated 5 x 5 x 8 mm (transverse by craniocaudal by AP) ossicle at the anterior superolateral right acetabulum. Minimal degenerative spurring at the primary posterior femoral head-neck junction (coronal series 5, images 45 through 50). Up to 13 mm lucent subchondral cystic changes within the anterior superior right femoral head-neck junction (coronal series 5, image 62 and axial series 2, image 93). Mild pubic symphysis joint space narrowing and subchondral sclerosis. Mild visualized anterior right sacroiliac joint space narrowing, subchondral sclerosis, and peripheral spurring. No right hip joint effusion is seen. No cortical erosion is seen to indicate osteomyelitis or septic arthritis. Ligaments Suboptimally assessed by CT. Muscles and Tendons There is enlargement of the right psoas muscle. The left psoas muscle is only partially visualized. In the right psoas muscle there is a heterogeneous, peripherally increased density and centrally both dependent increased density and nondependent low-density collection suggesting evolving hematoma with blood products of multiple ages and likely layering hematocrit level. In the appropriate clinical setting, this also could be an infected fluid collection. There is mild to moderate stranding within the mesenteric fat just anterior to the right psoas muscle. This abnormal heterogeneous density and swelling appears to involve at least the 16 cm craniocaudal dimension of the visualized psoas muscle extending into the right iliopsoas junction, although this also  extends off the superior plane of view (axial series 4, images 1 through 101 of 160 and coronal series 8 images 58 through 85). Given the heterogeneity of this fluid within the right psoas muscle, it is difficult to exclude an infected component/abscess. The most distinct heterogeneous collection within the right psoas muscle measures up to approximately 4.5 cm in transverse dimension and 4.3 cm in AP dimension and is visualized measuring at least 7 cm in craniocaudal dimension, extending off the superior plane of view (coronal series 8, image 68). Inferior to this, the more inferior aspect of the right psoas muscle remains enlarged and mildly heterogeneous. Soft tissues Mild-to-moderate sigmoid diverticulosis. High-grade atherosclerotic calcifications. IMPRESSION: 1. There is enlargement of the right psoas muscle. In the right psoas muscle there is a heterogeneous,  peripherally increased density and centrally both dependent increased density and nondependent low-density collection possibly representing an evolving hematoma with blood products of multiple ages and a layering hematocrit level. In the appropriate clinical setting, this also could be an infected fluid collection/abscess. This collection appears to involve at least the 16 cm craniocaudal dimension of the visualized psoas muscle extending into the right iliopsoas junction, although this also extends off the superior plane of view. 2. No right hip joint effusion is seen. No cortical erosion is seen to indicate osteomyelitis or septic arthritis. 3. Mild right femoroacetabular osteoarthritis. Electronically Signed   By: Bertina Broccoli M.D.   On: 12/26/2023 10:08   VAS US  LOWER EXTREMITY VENOUS (DVT) Result Date: 12/25/2023  Lower Venous DVT Study Patient Name:  Jason Santana  Date of Exam:   12/25/2023 Medical Rec #: [id number]     Accession #:    [id number] Date of Birth: 02/01/1932    Patient Gender: M Patient Age:   56 years Exam Location:  [facility] Procedure:      VAS US  LOWER EXTREMITY VENOUS (DVT) Referring Phys: Deforest Fast --------------------------------------------------------------------------------  Indications: Pain, Swelling, and Edema.  Risk Factors: CHF. Comparison Study: 10/31/23 - Venous reflux study was positive for right leg                   venous insufficiency. Negative DVT. Performing Technologist: Franky Ivanoff Sturdivant-Jones RDMS, RVT  Examination Guidelines: A complete evaluation includes B-mode imaging, spectral Doppler, color Doppler, and power Doppler as needed of all accessible portions of each vessel. Bilateral testing is considered an integral part of a complete examination. Limited examinations for reoccurring indications may be performed as noted. The reflux portion of the exam is performed with the patient in reverse Trendelenburg.  +---------+---------------+---------+-----------+----------+--------------+ RIGHT    CompressibilityPhasicitySpontaneityPropertiesThrombus Aging +---------+---------------+---------+-----------+----------+--------------+ CFV      Full           Yes      Yes                                 +---------+---------------+---------+-----------+----------+--------------+ SFJ      Full                                                        +---------+---------------+---------+-----------+----------+--------------+ FV Prox  Full                                                        +---------+---------------+---------+-----------+----------+--------------+ FV Mid   Full                                                        +---------+---------------+---------+-----------+----------+--------------+ FV DistalFull                                                        +---------+---------------+---------+-----------+----------+--------------+  PFV      Full                                                         +---------+---------------+---------+-----------+----------+--------------+ POP      Full           Yes      Yes                                 +---------+---------------+---------+-----------+----------+--------------+ PTV      Full                                                        +---------+---------------+---------+-----------+----------+--------------+ PERO     Full                                                        +---------+---------------+---------+-----------+----------+--------------+   +---------+---------------+---------+-----------+----------+--------------+ LEFT     CompressibilityPhasicitySpontaneityPropertiesThrombus Aging +---------+---------------+---------+-----------+----------+--------------+ CFV      Full           Yes      Yes                                 +---------+---------------+---------+-----------+----------+--------------+ SFJ      Full                                                        +---------+---------------+---------+-----------+----------+--------------+ FV Prox  Full                                                        +---------+---------------+---------+-----------+----------+--------------+ FV Mid   Full                                                        +---------+---------------+---------+-----------+----------+--------------+ FV DistalFull                                                        +---------+---------------+---------+-----------+----------+--------------+ PFV      Full                                                        +---------+---------------+---------+-----------+----------+--------------+  POP      Full           Yes      Yes                                 +---------+---------------+---------+-----------+----------+--------------+ PTV      Full                                                         +---------+---------------+---------+-----------+----------+--------------+ PERO     Full                                                        +---------+---------------+---------+-----------+----------+--------------+     Summary: BILATERAL: - No evidence of deep vein thrombosis seen in the lower extremities, bilaterally. -No evidence of popliteal cyst, bilaterally.   *See table(s) above for measurements and observations. Electronically signed by Genny Kid MD on 12/25/2023 at 4:39:18 PM.    Final    DG HIP UNILAT WITH PELVIS 1V RIGHT Result Date: 12/25/2023 CLINICAL DATA:  Right hip pain EXAM: DG HIP (WITH OR WITHOUT PELVIS) 1V RIGHT COMPARISON:  None Available. FINDINGS: No acute fracture or dislocation. Degenerative changes pubic symphysis, both hips, SI joints and lower lumbar spine. IMPRESSION: No acute fracture or dislocation. Electronically Signed   By: Rozell Cornet M.D.   On: 12/25/2023 00:10   ECHOCARDIOGRAM LIMITED Result Date: 12/23/2023    ECHOCARDIOGRAM LIMITED REPORT   Patient Name:   Jason Santana Date of Exam: 12/23/2023 Medical Rec #:  [id number]    Height:       70.0 in Accession #:    [id number]   Weight:       173.3 lb Date of Birth:  10/24/1931   BSA:          1.964 m Patient Age:    89 years     BP:           125/54 mmHg Patient Gender: M            HR:           73 bpm. Exam Location:  Inpatient Procedure: Limited Echo, Color Doppler and Cardiac Doppler (Both Spectral and            Color Flow Doppler were utilized during procedure). Indications:    Fever R50.9  History:        Patient has prior history of Echocardiogram examinations, most                 recent 10/15/2023. CHF, CAD, Arrythmias:Atrial Fibrillation; Risk                 Factors:Hypertension.  Sonographer:    Sherline Distel Senior RDCS Referring Phys: [id number] MAURICIO DANIEL ARRIEN IMPRESSIONS  1. Left ventricular ejection fraction, by estimation, is 60 to 65%. The left ventricle has normal function. Left ventricular  diastolic function could not be evaluated.  2. Right ventricular systolic function is low normal. The right ventricular size is mildly enlarged. There is mildly elevated pulmonary artery systolic pressure. The estimated right ventricular systolic pressure  is 40.3 mmHg.  3. Left atrial size was moderately dilated.  4. Right atrial size was moderately dilated.  5. The mitral valve is abnormal. Trivial mitral valve regurgitation. Moderate mitral annular calcification.  6. The tricuspid valve is abnormal. Tricuspid valve regurgitation is moderate.  7. The aortic valve has been repaired/replaced. Aortic valve regurgitation is trivial. No aortic stenosis is present. Procedure Date: 2000. Echo findings are consistent with normal structure and function of the aortic valve prosthesis. Aortic valve mean  gradient measures 7.0 mmHg. Aortic valve Vmax measures 1.74 m/s.  8. The inferior vena cava is normal in size with <50% respiratory variability, suggesting right atrial pressure of 8 mmHg. Conclusion(s)/Recommendation(s): No evidence of valvular vegetations on this transthoracic echocardiogram. Consider a transesophageal echocardiogram to exclude infective endocarditis if clinically indicated. Limited echo to assess for endocarditis. FINDINGS  Left Ventricle: Left ventricular ejection fraction, by estimation, is 60 to 65%. The left ventricle has normal function. Left ventricular diastolic function could not be evaluated. Right Ventricle: The right ventricular size is mildly enlarged. Right ventricular systolic function is low normal. There is mildly elevated pulmonary artery systolic pressure. The tricuspid regurgitant velocity is 2.84 m/s, and with an assumed right atrial pressure of 8 mmHg, the estimated right ventricular systolic pressure is 40.3 mmHg. Left Atrium: Left atrial size was moderately dilated. Right Atrium: Right atrial size was moderately dilated. Pericardium: There is no evidence of pericardial effusion.  Mitral Valve: The mitral valve is abnormal. Moderate mitral annular calcification. Trivial mitral valve regurgitation. Tricuspid Valve: The tricuspid valve is abnormal. Tricuspid valve regurgitation is moderate. Aortic Valve: The aortic valve has been repaired/replaced. Aortic valve regurgitation is trivial. No aortic stenosis is present. Aortic valve mean gradient measures 7.0 mmHg. Aortic valve peak gradient measures 12.1 mmHg. There is a St. Jude bioprosthetic mechanical valve present in the aortic position. Echo findings are consistent with normal structure and function of the aortic valve prosthesis. Pulmonic Valve: The pulmonic valve was normal in structure. Venous: The inferior vena cava is normal in size with less than 50% respiratory variability, suggesting right atrial pressure of 8 mmHg. Additional Comments: Spectral Doppler performed. Color Doppler performed.  AORTIC VALVE AV Vmax:           174.00 cm/s AV Vmean:          128.000 cm/s AV VTI:            0.399 m AV Peak Grad:      12.1 mmHg AV Mean Grad:      7.0 mmHg LVOT Vmax:         65.70 cm/s LVOT Vmean:        47.900 cm/s LVOT VTI:          0.158 m LVOT/AV VTI ratio: 0.40 TRICUSPID VALVE TR Peak grad:   32.3 mmHg TR Vmax:        284.00 cm/s  SHUNTS Systemic VTI: 0.16 m Jules Oar MD Electronically signed by Jules Oar MD Signature Date/Time: 12/23/2023/10:38:41 AM    Final    DG Chest 1 View Result Date: 12/21/2023 CLINICAL DATA:  Follow-up exam. EXAM: CHEST  1 VIEW COMPARISON:  12/20/2023. FINDINGS: The heart size and mediastinal contours are stable. There is atherosclerotic calcification of the aorta. A hazy opacity is noted at the left lung base and there is a small left pleural effusion. The right lung is clear. No pneumothorax bilaterally. Sternotomy wires and a prosthetic cardiac valve are noted. IMPRESSION: Small left pleural effusion with atelectasis  or infiltrate at the left lung base, not significantly changed from the prior  exam. Electronically Signed   By: Wyvonnia Heimlich M.D.   On: 12/21/2023 18:03   DG CHEST PORT 1 VIEW Result Date: 12/20/2023 CLINICAL DATA:  Fever. EXAM: PORTABLE CHEST 1 VIEW COMPARISON:  12/17/2023. FINDINGS: There is increasing left basilar opacity, favoring combination of left pleural effusion and left lung atelectasis with or without superimposed pneumonitis/consolidation. Correlate clinically. There is diffuse mild-to-moderate pulmonary vascular congestion, similar to the prior study. Bilateral lung fields are otherwise clear. Right lateral costophrenic angle is clear. Stable cardio-mediastinal silhouette. There are surgical staples along the heart border and sternotomy wires, status post CABG (coronary artery bypass graft). No acute osseous abnormalities. The soft tissues are within normal limits. IMPRESSION: *Increasing left basilar opacity, favoring combination of left pleural effusion and left lung atelectasis with or without superimposed pneumonitis/consolidation. Correlate clinically. *Mild-to-moderate pulmonary vascular congestion. Correlate clinically for pulmonary edema. Electronically Signed   By: Beula Brunswick M.D.   On: 12/20/2023 11:36   DG Chest 2 View Result Date: 12/17/2023 CLINICAL DATA:  Dyspnea and CHF. EXAM: CHEST - 2 VIEW COMPARISON:  Chest x-ray 10/03/2023 FINDINGS: The heart is enlarged. Patient is status post cardiac surgery and valve replacement. There is a small left pleural effusion. There is no focal lung infiltrate or pneumothorax. No acute fractures are seen. IMPRESSION: 1. Cardiomegaly. 2. Small left pleural effusion.  Is Electronically Signed   By: Tyron Gallon M.D.   On: 12/17/2023 22:13    Microbiology: Recent Results (from the past 240 hours)  MRSA Next Gen by PCR, Nasal     Status: None   Collection Time: 12/18/23  4:08 PM   Specimen: Nasal Mucosa; Nasal Swab  Result Value Ref Range Status   MRSA by PCR Next Gen NOT DETECTED NOT DETECTED Final    Comment:  (NOTE) The GeneXpert MRSA Assay (FDA approved for NASAL specimens only), is one component of a comprehensive MRSA colonization surveillance program. It is not intended to diagnose MRSA infection nor to guide or monitor treatment for MRSA infections. Test performance is not FDA approved in patients less than 19 years old. Performed at [facility], [address], [place], [place] [address]   Culture, blood (Routine X 2) w Reflex to ID Panel     Status: Abnormal   Collection Time: 12/20/23 11:26 AM   Specimen: BLOOD LEFT ARM  Result Value Ref Range Status   Specimen Description BLOOD LEFT ARM  Final   Special Requests   Final    BOTTLES DRAWN AEROBIC AND ANAEROBIC Blood Culture adequate volume   Culture  Setup Time   Final    GRAM POSITIVE COCCI IN CHAINS ANAEROBIC BOTTLE ONLY CRITICAL RESULT CALLED TO, READ BACK BY AND VERIFIED WITH: PHARMD J LEDFORD 12/21/2023 @ 0412 BY AB Performed at [facility], [address] [address], [place], [place] [address]    Culture STREPTOCOCCUS MITIS/ORALIS (A)  Final   Report Status 12/23/2023 FINAL  Final   Organism ID, Bacteria STREPTOCOCCUS MITIS/ORALIS  Final      Susceptibility   Streptococcus mitis/oralis - MIC*    PENICILLIN <=0.06 SENSITIVE Sensitive     CEFTRIAXONE  <=0.12 SENSITIVE Sensitive     LEVOFLOXACIN  0.5 SENSITIVE Sensitive     VANCOMYCIN 0.5 SENSITIVE Sensitive     * STREPTOCOCCUS MITIS/ORALIS  Blood Culture ID Panel (Reflexed)     Status: Abnormal   Collection Time: 12/20/23 11:26 AM  Result Value Ref  Range Status   Enterococcus faecalis NOT DETECTED NOT DETECTED Final   Enterococcus Faecium NOT DETECTED NOT DETECTED Final   Listeria monocytogenes NOT DETECTED NOT DETECTED Final   Staphylococcus species NOT DETECTED NOT DETECTED Final   Staphylococcus aureus (BCID) NOT DETECTED NOT DETECTED Final   Staphylococcus epidermidis NOT DETECTED NOT DETECTED Final   Staphylococcus lugdunensis NOT DETECTED NOT DETECTED Final    Streptococcus species DETECTED (A) NOT DETECTED Final    Comment: Not Enterococcus species, Streptococcus agalactiae, Streptococcus pyogenes, or Streptococcus pneumoniae. CRITICAL RESULT CALLED TO, READ BACK BY AND VERIFIED WITH: PHARMD J LEDFORD 12/21/2023 @ 0412 BY AB    Streptococcus agalactiae NOT DETECTED NOT DETECTED Final   Streptococcus pneumoniae NOT DETECTED NOT DETECTED Final   Streptococcus pyogenes NOT DETECTED NOT DETECTED Final   A.calcoaceticus-baumannii NOT DETECTED NOT DETECTED Final   Bacteroides fragilis NOT DETECTED NOT DETECTED Final   Enterobacterales NOT DETECTED NOT DETECTED Final   Enterobacter cloacae complex NOT DETECTED NOT DETECTED Final   Escherichia coli NOT DETECTED NOT DETECTED Final   Klebsiella aerogenes NOT DETECTED NOT DETECTED Final   Klebsiella oxytoca NOT DETECTED NOT DETECTED Final   Klebsiella pneumoniae NOT DETECTED NOT DETECTED Final   Proteus species NOT DETECTED NOT DETECTED Final   Salmonella species NOT DETECTED NOT DETECTED Final   Serratia marcescens NOT DETECTED NOT DETECTED Final   Haemophilus influenzae NOT DETECTED NOT DETECTED Final   Neisseria meningitidis NOT DETECTED NOT DETECTED Final   Pseudomonas aeruginosa NOT DETECTED NOT DETECTED Final   Stenotrophomonas maltophilia NOT DETECTED NOT DETECTED Final   Candida albicans NOT DETECTED NOT DETECTED Final   Candida auris NOT DETECTED NOT DETECTED Final   Candida glabrata NOT DETECTED NOT DETECTED Final   Candida krusei NOT DETECTED NOT DETECTED Final   Candida parapsilosis NOT DETECTED NOT DETECTED Final   Candida tropicalis NOT DETECTED NOT DETECTED Final   Cryptococcus neoformans/gattii NOT DETECTED NOT DETECTED Final    Comment: Performed at [facility], [address] [place], [place] [address]  Culture, blood (Routine X 2) w Reflex to ID Panel     Status: None   Collection Time: 12/20/23 11:27 AM   Specimen: BLOOD RIGHT ARM  Result Value Ref Range Status    Specimen Description BLOOD RIGHT ARM  Final   Special Requests   Final    BOTTLES DRAWN AEROBIC AND ANAEROBIC Blood Culture adequate volume   Culture   Final    NO GROWTH 5 DAYS Performed at [facility], [address], [place], [place] [address]    Report Status 12/25/2023 FINAL  Final  Culture, blood (Routine X 2) w Reflex to ID Panel     Status: None   Collection Time: 12/22/23  2:32 AM   Specimen: BLOOD RIGHT HAND  Result Value Ref Range Status   Specimen Description BLOOD RIGHT HAND  Final   Special Requests   Final    BOTTLES DRAWN AEROBIC AND ANAEROBIC Blood Culture adequate volume   Culture   Final    NO GROWTH 5 DAYS Performed at [facility], [address] [place], [place] [address]    Report Status 12/27/2023 FINAL  Final  Culture, blood (Routine X 2) w Reflex to ID Panel     Status: None   Collection Time: 12/22/23  2:44 AM   Specimen: BLOOD LEFT ARM  Result Value Ref Range Status   Specimen Description BLOOD LEFT ARM  Final   Special Requests  Final    BOTTLES DRAWN AEROBIC AND ANAEROBIC Blood Culture adequate volume   Culture   Final    NO GROWTH 5 DAYS Performed at [facility], [address] [place], [place] [address]    Report Status 12/27/2023 FINAL  Final     Labs: Basic Metabolic Panel: Recent Labs  Lab 12/23/23 0232 12/24/23 0816 12/25/23 0233 12/26/23 0311 12/28/23 0235  NA 137 137 138 138 139  K 4.2 4.2 4.9 5.1 4.3  CL 110 106 106 107 107  CO2 23 23 21* 23 25  GLUCOSE 90 95 122* 131* 120*  BUN 21 20 27* 34* 36*  CREATININE 1.48* 1.58* 1.71* 1.69* 1.53*  CALCIUM  8.0* 8.1* 8.3* 8.7* 8.2*  PHOS 2.7  --   --   --   --    Liver Function Tests: Recent Labs  Lab 12/23/23 0232 12/26/23 0311 12/28/23 0235  AST  --  90* 103*  ALT  --  24 16  ALKPHOS  --  86 77  BILITOT  --  0.8 1.4*  PROT  --  6.2* 5.9*  ALBUMIN 2.0* 2.5* 2.1*   No results for input(s): "LIPASE", "AMYLASE" in the last 168 hours. No results for  input(s): "AMMONIA" in the last 168 hours. CBC: Recent Labs  Lab 12/22/23 0232 12/23/23 0232 12/25/23 0233 12/26/23 0311 12/27/23 0242 12/27/23 1457 12/28/23 0235  WBC 4.8   < > 9.1 12.7* 13.2* 12.8* 12.7*  NEUTROABS 3.1  --   --   --   --   --   --   HGB 8.9*   < > 9.2* 8.7* 8.3* 8.1* 8.1*  HCT 28.6*   < > 29.6* 28.6* 26.7* 26.9* 26.5*  MCV 105.1*   < > 105.0* 106.3* 107.7* 106.7* 106.0*  PLT 162   < > 219 236 235 230 219   < > = values in this interval not displayed.   Cardiac Enzymes: No results for input(s): "CKTOTAL", "CKMB", "CKMBINDEX", "TROPONINI" in the last 168 hours. BNP: BNP (last 3 results) Recent Labs    10/03/23 1748 12/17/23 2128  BNP 171.3* 252.6*    ProBNP (last 3 results) No results for input(s): "PROBNP" in the last 8760 hours.  CBG: No results for input(s): "GLUCAP" in the last 168 hours.     Signed:  Deforest Fast MD.  [facility] 12/28/2023, 1:57 PM

## 2023-12-28 NOTE — Progress Notes (Signed)
 PROGRESS NOTE    Jason Santana  ZOX:[id number] DOB: June 28, 1932 DOA: 12/17/2023 PCP: Laneta Pintos, MD  89/M w CAD, A-fib/flutter and mechanical aortic valve replacement on Coumadin , CHF, CKD stage IIIa, cryptogenic cirrhosis, GERD, hypothyroidism presenting with complaints of shortness of breath and bilateral lower extremity edema.  Reports chronically dark stools. In ED, VSS, labs w/ Hb 6.8, creatinine 1.4 , albumin 2.5,  troponin 255> 251, BNP 252, INR 3.7.  FOBT positive , CXR wcardiomegaly and small left pleural effusion.   Gastroenterology also consulted -Dr. Elvin Hammer with [facility]. Patient was given IV Lasix  40 mg and IV Protonix  80 mg in the ED. 1 unit PRBCs ordered. - 4/22-23 developed right hip pain, CT on 4/24 noted large right psoas fluid collection likely hematoma unable to exclude infection, IR consulted, warfarin held - 4/24, IR felt imaging most consistent with intramuscular hematoma with supratherapeutic INR and did not recommend aspiration  Subjective: Continues to have moderate amount of right groin/hip pain  Assessment and Plan:  New large right psoas hematoma - Cannot rule out infection - Noted on CT, IR consulted for CT-guided aspiration, they felt this is likely intramuscular hematoma and suggested against aspiration, in the setting of coagulopathy - INR supratherapeutic 4/25, vitamin K given, warfarin on hold - INR down to 1.8, started on IV heparin , monitor hemoglobin - Appreciate infectious disease input, recommended to continue IV Ancef  while hospitalized and then transition to oral amoxicillin  1 g twice daily to complete 4 weeks of therapy - Discussed with ID and spouse, overall prognosis is poor,  palliative care consult requested  Acute on chronic diastolic CHF  -Echo with EF 60 to 65%, RV  low normal, RVSP 53,7 mmHg. LA with moderate dilation, moderate tricuspid regurgitation, St Jude mechanical valve at the aortic position, -Cards following, diuresed with IV  Lasix , down 10 LB, now switched to oral Lasix  - Continue Cardizem   Mechanical aortic valve -holding warfarin, starting IV heparin  today   Strep mitis bacteremia -Presumed to be related to recent dental extraction 4 weeks ago -TTE negative -Concern regarding TEE in the setting of recent GI bleed, anticoagulation and anemia, infectious disease following treated with IV ceftriaxone  initially -Repeat blood cultures are negative - Was transitioned to oral amoxicillin , however with large right psoas fluid collection which is likely hematoma but unable to exclude infection now back on IV Ancef , transition to oral amoxicillin  at discharge complete 4-week therapy   Acute upper GI bleed/acute blood loss anemia -04/18 EGD, with 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  - Status post IV iron  - Continue to follow hemoglobin trend.   Paroxysmal atrial flutter (HCC) - Continue Cardizem , warfarin held   CAD (coronary artery disease) of artery bypass graft -No chest pain, no acute coronary syndrome.  - 2D echo demonstrated no wall motion abnormalities.   Cirrhosis of liver without ascites (HCC) - Diuretics as above   Acute kidney injury on stage 3a chronic kidney disease (HCC) -Stable and at baseline   Hypothyroidism -Continue Synthroid .  DVT prophylaxis: warfarin Code Status: DNR Family Communication: None present, called and updated grand daughter yesterday Disposition Plan: To be determined, prognosis is poor  Consultants: Cards, infectious disease    Objective: Vitals:   12/28/23 0000 12/28/23 0324 12/28/23 0500 12/28/23 0704  BP: (!) 119/50 (!) 126/48  (!) 119/46  Pulse: 80 81    Resp: 15 15    Temp: 98.6 F (37 C) 98.3 F (36.8 C)  98.6 F (37 C)  TempSrc: Oral Oral  Oral  SpO2: 96% 94%    Weight:   81 kg   Height:        Intake/Output Summary (Last 24 hours) at 12/28/2023 1042 Last data filed at 12/28/2023 1610 Gross  per 24 hour  Intake 435.37 ml  Output 1300 ml  Net -864.63 ml   Filed Weights   12/26/23 0421 12/27/23 0400 12/28/23 0500  Weight: 77 kg 77.9 kg 81 kg    Examination: Gen: Elderly chronically ill male sitting up in bed, AAO x 3 HEENT: No JVD CVS: S1-S2, rate rhythm, metallic click Lungs: Poor air movement bilaterally Abdomen: Soft, nontender, bowel sounds present  Extremities: No edema, right groin with mildly painful range of motion Skin: no new rashes on exposed skin     Data Reviewed:   CBC: Recent Labs  Lab 12/22/23 0232 12/23/23 0232 12/25/23 0233 12/26/23 0311 12/27/23 0242 12/27/23 1457 12/28/23 0235  WBC 4.8   < > 9.1 12.7* 13.2* 12.8* 12.7*  NEUTROABS 3.1  --   --   --   --   --   --   HGB 8.9*   < > 9.2* 8.7* 8.3* 8.1* 8.1*  HCT 28.6*   < > 29.6* 28.6* 26.7* 26.9* 26.5*  MCV 105.1*   < > 105.0* 106.3* 107.7* 106.7* 106.0*  PLT 162   < > 219 236 235 230 219   < > = values in this interval not displayed.   Basic Metabolic Panel: Recent Labs  Lab 12/23/23 0232 12/24/23 0816 12/25/23 0233 12/26/23 0311 12/28/23 0235  NA 137 137 138 138 139  K 4.2 4.2 4.9 5.1 4.3  CL 110 106 106 107 107  CO2 23 23 21* 23 25  GLUCOSE 90 95 122* 131* 120*  BUN 21 20 27* 34* 36*  CREATININE 1.48* 1.58* 1.71* 1.69* 1.53*  CALCIUM  8.0* 8.1* 8.3* 8.7* 8.2*  PHOS 2.7  --   --   --   --    GFR: Estimated Creatinine Clearance: 33.8 mL/min (A) (by C-G formula based on SCr of 1.53 mg/dL (H)). Liver Function Tests: Recent Labs  Lab 12/23/23 0232 12/26/23 0311 12/28/23 0235  AST  --  90* 103*  ALT  --  24 16  ALKPHOS  --  86 77  BILITOT  --  0.8 1.4*  PROT  --  6.2* 5.9*  ALBUMIN 2.0* 2.5* 2.1*   No results for input(s): "LIPASE", "AMYLASE" in the last 168 hours. No results for input(s): "AMMONIA" in the last 168 hours. Coagulation Profile: Recent Labs  Lab 12/25/23 0233 12/26/23 0311 12/27/23 0242 12/27/23 1457 12/28/23 0235  INR 2.7* 3.9* 5.3* 2.6* 1.8*    Cardiac Enzymes: No results for input(s): "CKTOTAL", "CKMB", "CKMBINDEX", "TROPONINI" in the last 168 hours. BNP (last 3 results) No results for input(s): "PROBNP" in the last 8760 hours. HbA1C: No results for input(s): "HGBA1C" in the last 72 hours. CBG: No results for input(s): "GLUCAP" in the last 168 hours. Lipid Profile: No results for input(s): "CHOL", "HDL", "LDLCALC", "TRIG", "CHOLHDL", "LDLDIRECT" in the last 72 hours. Thyroid  Function Tests: No results for input(s): "TSH", "T4TOTAL", "FREET4", "T3FREE", "THYROIDAB" in the last 72 hours. Anemia Panel: No results for input(s): "VITAMINB12", "FOLATE", "FERRITIN", "TIBC", "IRON ", "RETICCTPCT" in the last 72 hours. Urine analysis:    Component Value Date/Time   COLORURINE STRAW (A) 12/20/2023 1700   APPEARANCEUR CLEAR 12/20/2023 1700   LABSPEC 1.005 12/20/2023 1700   PHURINE  7.0 12/20/2023 1700   GLUCOSEU NEGATIVE 12/20/2023 1700   HGBUR NEGATIVE 12/20/2023 1700   BILIRUBINUR NEGATIVE 12/20/2023 1700   BILIRUBINUR negative 12/15/2021 1002   KETONESUR NEGATIVE 12/20/2023 1700   PROTEINUR NEGATIVE 12/20/2023 1700   UROBILINOGEN 1.0 12/15/2021 1002   NITRITE NEGATIVE 12/20/2023 1700   LEUKOCYTESUR TRACE (A) 12/20/2023 1700   Sepsis Labs: @LABRCNTIP (procalcitonin:4,lacticidven:4)  ) Recent Results (from the past 240 hours)  MRSA Next Gen by PCR, Nasal     Status: None   Collection Time: 12/18/23  4:08 PM   Specimen: Nasal Mucosa; Nasal Swab  Result Value Ref Range Status   MRSA by PCR Next Gen NOT DETECTED NOT DETECTED Final    Comment: (NOTE) The GeneXpert MRSA Assay (FDA approved for NASAL specimens only), is one component of a comprehensive MRSA colonization surveillance program. It is not intended to diagnose MRSA infection nor to guide or monitor treatment for MRSA infections. Test performance is not FDA approved in patients less than 62 years old. Performed at [facility], [address]  [place], [place] [address]   Culture, blood (Routine X 2) w Reflex to ID Panel     Status: Abnormal   Collection Time: 12/20/23 11:26 AM   Specimen: BLOOD LEFT ARM  Result Value Ref Range Status   Specimen Description BLOOD LEFT ARM  Final   Special Requests   Final    BOTTLES DRAWN AEROBIC AND ANAEROBIC Blood Culture adequate volume   Culture  Setup Time   Final    GRAM POSITIVE COCCI IN CHAINS ANAEROBIC BOTTLE ONLY CRITICAL RESULT CALLED TO, READ BACK BY AND VERIFIED WITH: PHARMD J LEDFORD 12/21/2023 @ 0412 BY AB Performed at [facility], [address] [place], [place] [address]    Culture STREPTOCOCCUS MITIS/ORALIS (A)  Final   Report Status 12/23/2023 FINAL  Final   Organism ID, Bacteria STREPTOCOCCUS MITIS/ORALIS  Final      Susceptibility   Streptococcus mitis/oralis - MIC*    PENICILLIN <=0.06 SENSITIVE Sensitive     CEFTRIAXONE  <=0.12 SENSITIVE Sensitive     LEVOFLOXACIN  0.5 SENSITIVE Sensitive     VANCOMYCIN 0.5 SENSITIVE Sensitive     * STREPTOCOCCUS MITIS/ORALIS  Blood Culture ID Panel (Reflexed)     Status: Abnormal   Collection Time: 12/20/23 11:26 AM  Result Value Ref Range Status   Enterococcus faecalis NOT DETECTED NOT DETECTED Final   Enterococcus Faecium NOT DETECTED NOT DETECTED Final   Listeria monocytogenes NOT DETECTED NOT DETECTED Final   Staphylococcus species NOT DETECTED NOT DETECTED Final   Staphylococcus aureus (BCID) NOT DETECTED NOT DETECTED Final   Staphylococcus epidermidis NOT DETECTED NOT DETECTED Final   Staphylococcus lugdunensis NOT DETECTED NOT DETECTED Final   Streptococcus species DETECTED (A) NOT DETECTED Final    Comment: Not Enterococcus species, Streptococcus agalactiae, Streptococcus pyogenes, or Streptococcus pneumoniae. CRITICAL RESULT CALLED TO, READ BACK BY AND VERIFIED WITH: PHARMD J LEDFORD 12/21/2023 @ 0412 BY AB    Streptococcus agalactiae NOT DETECTED NOT DETECTED Final   Streptococcus pneumoniae NOT DETECTED NOT  DETECTED Final   Streptococcus pyogenes NOT DETECTED NOT DETECTED Final   A.calcoaceticus-baumannii NOT DETECTED NOT DETECTED Final   Bacteroides fragilis NOT DETECTED NOT DETECTED Final   Enterobacterales NOT DETECTED NOT DETECTED Final   Enterobacter cloacae complex NOT DETECTED NOT DETECTED Final   Escherichia coli NOT DETECTED NOT DETECTED Final   Klebsiella aerogenes NOT DETECTED NOT DETECTED Final   Klebsiella oxytoca NOT DETECTED NOT DETECTED Final  Klebsiella pneumoniae NOT DETECTED NOT DETECTED Final   Proteus species NOT DETECTED NOT DETECTED Final   Salmonella species NOT DETECTED NOT DETECTED Final   Serratia marcescens NOT DETECTED NOT DETECTED Final   Haemophilus influenzae NOT DETECTED NOT DETECTED Final   Neisseria meningitidis NOT DETECTED NOT DETECTED Final   Pseudomonas aeruginosa NOT DETECTED NOT DETECTED Final   Stenotrophomonas maltophilia NOT DETECTED NOT DETECTED Final   Candida albicans NOT DETECTED NOT DETECTED Final   Candida auris NOT DETECTED NOT DETECTED Final   Candida glabrata NOT DETECTED NOT DETECTED Final   Candida krusei NOT DETECTED NOT DETECTED Final   Candida parapsilosis NOT DETECTED NOT DETECTED Final   Candida tropicalis NOT DETECTED NOT DETECTED Final   Cryptococcus neoformans/gattii NOT DETECTED NOT DETECTED Final    Comment: Performed at [facility], [address] [place], [place] [address]  Culture, blood (Routine X 2) w Reflex to ID Panel     Status: None   Collection Time: 12/20/23 11:27 AM   Specimen: BLOOD RIGHT ARM  Result Value Ref Range Status   Specimen Description BLOOD RIGHT ARM  Final   Special Requests   Final    BOTTLES DRAWN AEROBIC AND ANAEROBIC Blood Culture adequate volume   Culture   Final    NO GROWTH 5 DAYS Performed at [facility], [address] [place], [place] [address]    Report Status 12/25/2023 FINAL  Final  Culture, blood (Routine X 2) w Reflex to ID Panel     Status: None   Collection  Time: 12/22/23  2:32 AM   Specimen: BLOOD RIGHT HAND  Result Value Ref Range Status   Specimen Description BLOOD RIGHT HAND  Final   Special Requests   Final    BOTTLES DRAWN AEROBIC AND ANAEROBIC Blood Culture adequate volume   Culture   Final    NO GROWTH 5 DAYS Performed at [facility], [address] [place], [place] [address]    Report Status 12/27/2023 FINAL  Final  Culture, blood (Routine X 2) w Reflex to ID Panel     Status: None   Collection Time: 12/22/23  2:44 AM   Specimen: BLOOD LEFT ARM  Result Value Ref Range Status   Specimen Description BLOOD LEFT ARM  Final   Special Requests   Final    BOTTLES DRAWN AEROBIC AND ANAEROBIC Blood Culture adequate volume   Culture   Final    NO GROWTH 5 DAYS Performed at [facility], [address] [place], [place] [address]    Report Status 12/27/2023 FINAL  Final     Radiology Studies: No results found.    Scheduled Meds:  dextromethorphan -guaiFENesin   2 tablet Oral BID   diltiazem   120 mg Oral Daily   feeding supplement  237 mL Oral BID BM   furosemide   40 mg Oral Daily   levothyroxine   125 mcg Oral QAC breakfast   lidocaine   1 patch Transdermal Q24H   pantoprazole   40 mg Oral BID   senna-docusate  1 tablet Oral BID   Continuous Infusions:   ceFAZolin  (ANCEF ) IV 2 g (12/28/23 0628)   heparin  750 Units/hr (12/28/23 0635)      LOS: 11 days    Time spent:    Deforest Fast, MD [facility]   12/28/2023, 10:42 AM

## 2023-12-28 NOTE — TOC Transition Note (Signed)
 Transition of Care [facility]) - Discharge Note   Patient Details  Name: Jason Santana MRN: [id number] Date of Birth: 25-Mar-1932  [facility] of [facility] CM/SW Contact:  Jarrell Merritts, LCSW Phone Number: 12/28/2023, 2:05 PM   Clinical Narrative:     Patient will DC to:?[facility] Anticipated DC date:12/28/2023? Family notified:?By Presenter, broadcasting by: Lyna Sandhoff   Per MD patient ready for DC to [facility]. RN, patient, patient's family, and facility notified of DC. Discharge Summary sent to facility. RN given number for report   [phone] DC packet on chart. Ambulance transport requested for patient.  CSW signing off.   [place], [place] [phone]     Barriers to Discharge: Continued Medical Work up   Patient Goals and CMS Choice Patient states their goals for this hospitalization and ongoing recovery are:: return home CMS Medicare.gov Compare Post Acute Care list provided to:: Patient Choice offered to / list presented to : Patient      Discharge Placement                       Discharge Plan and Services Additional resources added to the After Visit Summary for     Discharge Planning Services: CM Consult Post Acute Care Choice: Home Health, Durable Medical Equipment                    HH Arranged: RN, PT, OT [facility] Agency: [facility] Date [facility] Agency Contacted: 12/23/23 Time HH Agency Contacted: 1510 Representative spoke with at [facility] Agency: Amy  Social Drivers of Health (SDOH) Interventions SDOH Screenings   Food Insecurity: No Food Insecurity (12/18/2023)  Housing: Low Risk  (12/18/2023)  Transportation Needs: No Transportation Needs (12/18/2023)  Utilities: Not At Risk (12/18/2023)  Depression (PHQ2-9): Low Risk  (11/13/2023)  Social Connections: Socially Integrated (12/18/2023)  Tobacco Use: Medium Risk (12/20/2023)     Readmission Risk Interventions     No data to display

## 2023-12-28 NOTE — Consult Note (Signed)
 Palliative Care Consult Note                                  Date: 12/28/2023   Patient Name: Jason Santana  DOB: 1932-03-08  MRN: [id number]  Age / Sex: 89 y.o., male  PCP: Laneta Pintos, MD Referring Physician: Deforest Fast, MD  Reason for Consultation: Establishing goals of care  HPI/Patient Profile: 89 y.o. male  with past medical history of CAD, A-fib/flutter and mechanical aortic valve replacement on Coumadin , CHF, CKD stage IIIa, cryptogenic cirrhosis, GERD, hypothyroidism  admitted on 12/17/2023 with shortness of breath and bilateral lower extremity edema.   Reports chronically dark stools. In ED, VSS, labs w/ Hb 6.8, creatinine 1.4 , albumin 2.5,  troponin 255> 251, BNP 252, INR 3.7.  FOBT positive. CXR with cardiomegaly and small left pleural effusion.   4/22- 4/23 developed right hip pain, CT on 4/24 noted large right psoas fluid collection likely hematoma   Past Medical History:  Diagnosis Date   Anticoagulation adequate 05/23/2019   Atrial fibrillation (HCC)    CAD (coronary artery disease) of artery bypass graft 2000   2000 with multiple bypasses    Chronic anticoagulation    Coumadin  therapy for mechanical aortic valve. Postoperative atrial fibrillation.    Essential hypertension    H/O mechanical aortic valve replacement 2000   [employer] AVR   Hypothyroidism    Premature ventricular contractions    S/P ablation of atrial fibrillation 05/22/19 05/23/2019   Typical atrial flutter (HCC) 05/23/2019    Subjective:   I have reviewed medical records including EPIC notes, labs and imaging, received update from team, assessed the patient and then met with the patient, his wife Neoma Banker, and daughter Alonna Shaquel to discuss diagnosis prognosis, GOC, EOL wishes, disposition and options.  I introduced Palliative Medicine as specialized medical care for people living with serious illness. It focuses on providing relief from  symptoms and stress of a serious illness. The goal is to improve quality of life for both the patient and the family.  Patient faces treatment option decisions, advanced directive decisions and anticipatory care needs.  Today's Discussion: Met with patient, his wife Gracie, and their daughter Loetta Ringer. Gracie shared her understanding of the patient's chronic conditions and acute hospitalization. We discussed his strep bacteremia and right hip pain due to right psoas hematoma. Gracie shared and the patient confirmed that he has not eaten in several days as he has no appetite. They share that prior to admission the patient lived with his wife at home. In the weeks before admission his appetite and functional status had declined.  A discussion was had today regarding advanced directives. DNR status confirmed. We discussed the difference between an aggressive medical intervention path and a comfort focussed path. The patient shares he is tired of fighting and is ready to transition to comfort measures. We discussed what a transition to comfort measures would look like. Once a patient transitions to comfort measures they would no longer receive aggressive medical interventions such as continuous vital signs, lab work, radiology testing, or medications not focused on comfort. All care would focus on how the patient is looking and feeling. This would include management of any symptoms that may cause discomfort, pain, shortness of breath, cough, nausea, agitation, anxiety, and/or secretions etc. Symptoms would be managed with medications and other non-pharmacological interventions. The patient understands and confirms he would like to  transition to comfort measures. His family supports him in his decision.  We discuss that the goal of hospice is the preservation of dignity and quality at the end phases of life. Under hospice care, the focus changes from curative to symptom relief. I explained the three settings where  hospice services can be provided including the home, at a living facility (such as LTC SNF, Assisted Living, etc), and a hospice facility. I explained that acceptance to hospice in any specific location is the final decision of the hospice medical director and bed availability, if applicable. The patient and family agree that they would like to be assessed for inpatient hospice at [facility] in [place]. We discussed the process including consulting TOC.  Emotional support provided. Questions and concerns were addressed. Hard Choices booklet left for review. The family was encouraged to call with questions or concerns. PMT will continue to support holistically.  Review of Systems  Musculoskeletal:  Positive for myalgias (hip pain).    Objective:   Primary Diagnoses: Present on Admission:  Acute on chronic diastolic CHF (congestive heart failure) (HCC)  CAD (coronary artery disease) of artery bypass graft  Paroxysmal atrial flutter (HCC)  Essential hypertension  Stage 3a chronic kidney disease (HCC)  Hypothyroidism   Physical Exam Vitals reviewed.  Constitutional:      General: He is not in acute distress. Cardiovascular:     Rate and Rhythm: Normal rate.  Pulmonary:     Effort: Pulmonary effort is normal.  Skin:    General: Skin is warm and dry.  Neurological:     Mental Status: He is alert.  Psychiatric:        Mood and Affect: Mood normal.        Behavior: Behavior normal.     Vital Signs:  BP (!) 119/46 (BP Location: Right Arm)   Pulse 81   Temp 98.6 F (37 C) (Oral)   Resp 15   Ht 5\' 10"  (1.778 m)   Wt 81 kg   SpO2 94%   BMI 25.62 kg/m     Advanced Care Planning:   Existing Vynca/ACP Documentation: MOST form  Primary Decision Maker: PATIENT  Code Status/Advance Care Planning: DNR   Assessment & Plan:   SUMMARY OF RECOMMENDATIONS   DNR Full comfort care Evaluation for [facility]- TOC order placed and hospice liaison notified Comfort  medications per [facility] Continued PMT support  Symptom Management Dilaudid  PRN for pain/air hunger/comfort Robinul PRN for excessive secretions Ativan PRN for agitation/anxiety Zofran  PRN for nausea Liquifilm tears PRN for dry eyes Haldol PRN for agitation/anxiety May have comfort feeding Unrestricted visitations in the setting of EOL (per policy) Oxygen PRN 2L or less for comfort. No escalation.     Discussed with: bedside RN and Dr. Drexel Gentles  Time Total: 120 minutes    Thank you for allowing us  to participate in the care of LORENZ KERSTETTER PMT will continue to support holistically.   Signed by: Joaquim Muir, NP Palliative Medicine Team  Team Phone # [phone] (Nights/Weekends)  12/28/2023, 8:34 AM

## 2023-12-30 ENCOUNTER — Other Ambulatory Visit

## 2023-12-30 DIAGNOSIS — Z7901 Long term (current) use of anticoagulants: Secondary | ICD-10-CM

## 2024-01-02 DEATH — deceased

## 2024-01-09 ENCOUNTER — Other Ambulatory Visit (HOSPITAL_COMMUNITY): Payer: Self-pay | Admitting: Cardiology

## 2024-01-10 ENCOUNTER — Ambulatory Visit: Admitting: Nurse Practitioner

## 2024-01-15 ENCOUNTER — Inpatient Hospital Stay: Admitting: Infectious Diseases

## 2024-05-15 ENCOUNTER — Ambulatory Visit: Admitting: Family Medicine
